# Patient Record
Sex: Male | Born: 1990
Health system: Southern US, Community
[De-identification: ages and names within clinical notes are randomized; demographics above are authoritative.]

## PROBLEM LIST (undated history)

## (undated) ENCOUNTER — Emergency Department (HOSPITAL_COMMUNITY): Payer: 59

## (undated) DIAGNOSIS — J0100 Acute maxillary sinusitis, unspecified: Secondary | ICD-10-CM

## (undated) DIAGNOSIS — D571 Sickle-cell disease without crisis: Secondary | ICD-10-CM

## (undated) DIAGNOSIS — L309 Dermatitis, unspecified: Secondary | ICD-10-CM

## (undated) HISTORY — PX: WISDOM TOOTH EXTRACTION: SHX21

## (undated) HISTORY — DX: Sickle-cell disease without crisis: D57.1

## (undated) HISTORY — DX: Acute maxillary sinusitis, unspecified: J01.00

---
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
## 2009-09-16 ENCOUNTER — Emergency Department (HOSPITAL_COMMUNITY): Admission: EM | Admit: 2009-09-16 | Discharge: 2009-09-16 | Payer: Self-pay | Admitting: Emergency Medicine

## 2010-08-22 ENCOUNTER — Emergency Department (HOSPITAL_COMMUNITY)
Admission: EM | Admit: 2010-08-22 | Discharge: 2010-08-22 | Payer: Self-pay | Source: Home / Self Care | Admitting: Emergency Medicine

## 2011-11-02 ENCOUNTER — Emergency Department (HOSPITAL_COMMUNITY): Payer: Self-pay

## 2011-11-02 ENCOUNTER — Emergency Department (HOSPITAL_COMMUNITY)
Admission: EM | Admit: 2011-11-02 | Discharge: 2011-11-02 | Disposition: A | Discharge status: Home / Self Care | Payer: Self-pay | Attending: Emergency Medicine | Admitting: Emergency Medicine

## 2011-11-02 ENCOUNTER — Encounter (HOSPITAL_COMMUNITY): Payer: Self-pay | Admitting: *Deleted

## 2011-11-02 DIAGNOSIS — M25473 Effusion, unspecified ankle: Secondary | ICD-10-CM | POA: Insufficient documentation

## 2011-11-02 DIAGNOSIS — Y9239 Other specified sports and athletic area as the place of occurrence of the external cause: Secondary | ICD-10-CM | POA: Insufficient documentation

## 2011-11-02 DIAGNOSIS — M25476 Effusion, unspecified foot: Secondary | ICD-10-CM | POA: Insufficient documentation

## 2011-11-02 DIAGNOSIS — M25579 Pain in unspecified ankle and joints of unspecified foot: Secondary | ICD-10-CM | POA: Insufficient documentation

## 2011-11-02 DIAGNOSIS — S93409A Sprain of unspecified ligament of unspecified ankle, initial encounter: Secondary | ICD-10-CM | POA: Insufficient documentation

## 2011-11-02 DIAGNOSIS — X500XXA Overexertion from strenuous movement or load, initial encounter: Secondary | ICD-10-CM | POA: Insufficient documentation

## 2011-11-02 DIAGNOSIS — Y9367 Activity, basketball: Secondary | ICD-10-CM | POA: Insufficient documentation

## 2011-11-02 MED ORDER — IBUPROFEN 200 MG PO TABS
600.0000 mg | ORAL_TABLET | Freq: Once | ORAL | Status: AC
  Administered 2011-11-02: 600 mg via ORAL
  Filled 2011-11-02: qty 3

## 2011-11-02 MED ORDER — IBUPROFEN 600 MG PO TABS: 600.0000 mg | ORAL_TABLET | Freq: Four times a day (QID) | ORAL | Status: AC | PRN

## 2011-11-02 NOTE — ED Provider Notes (Signed)
History     CSN: [id number]  Arrival date & time 11/02/11  7829   First MD Initiated Contact with Patient 11/02/11 [phone]      Chief Complaint  Patient presents with  . Ankle Pain    (Consider location/radiation/quality/duration/timing/severity/associated sxs/prior treatment) Patient is a 21 y.o. male presenting with ankle pain. The history is provided by the patient.  Ankle Pain  Pertinent negatives include no numbness.  pt states twisted left ankle playing bball last pm. C/o pain esp laterally. Constant, dull, non radiating. Worse w walking and palpation. Skin intact. Painful to walk. No numbness/weakness. No proximal tib/fib or knee pain. Denies other injury.   Past Medical History  Diagnosis Date  . Sickle cell trait     History reviewed. No pertinent past surgical history.  No family history on file.  History  Substance Use Topics  . Smoking status: Former Games developer  . Smokeless tobacco: Not on file  . Alcohol Use: Yes      Review of Systems  Constitutional: Negative for fever.  Skin: Negative for wound.  Neurological: Negative for numbness.    Allergies  Review of patient's allergies indicates no known allergies.  Home Medications  No current outpatient prescriptions on file.  BP 118/80  Pulse 97  Temp(Src) 97.7 F (36.5 C) (Oral)  Resp 16  Ht 5' 7.5" (1.715 m)  Wt 155 lb (70.308 kg)  BMI 23.92 kg/m2  SpO2 98%  Physical Exam  Nursing note and vitals reviewed. Constitutional: He appears well-developed and well-nourished. No distress.  HENT:  Head: Atraumatic.  Neck: No tracheal deviation present.  Cardiovascular: Normal rate.   Pulmonary/Chest: Effort normal. No accessory muscle usage. No respiratory distress.  Musculoskeletal: Normal range of motion.       Mild sts and tenderness laterally. Ankle grossly stable. Distal pulses palp. Skin intact. Good rom at knee and ankle. No prox tib fib or knee tenderness. No knee effusion.   Neurological: He is  alert.       Motor intact bil. Foot nvi.   Skin: Skin is warm and dry.  Psychiatric: He has a normal mood and affect.    ED Course  Procedures (including critical care time)  Labs Reviewed - No data to display Dg Ankle Complete Left  11/02/2011  *RADIOLOGY REPORT*  Clinical Data: Ankle pain post injury  LEFT ANKLE COMPLETE - 3+ VIEW  Comparison: None.  Findings: Three views of the left ankle submitted.  No acute fracture or subluxation.  Mild soft tissue swelling adjacent to lateral malleolus.  IMPRESSION: No acute fracture or subluxation.  Mild soft tissue swelling adjacent to lateral malleolus.  Original Report Authenticated By: Natasha Mead, M.D.        MDM  Nilda Calamity. Confirmed nkda. Motrin po. Pt already has crutches with him. aso brace applied.         Suzi Roots, MD 11/02/11 [phone]

## 2011-11-02 NOTE — ED Notes (Signed)
Pt discharged home, instructed to use RICE to reduce swelling and discomfort. Had no further questions. Will use crutches that he came to facility with.

## 2011-11-02 NOTE — ED Notes (Signed)
Ortho at bedside placing splint

## 2011-11-02 NOTE — Discharge Instructions (Signed)
Elevate ankle. Icepack/cold to sore area. Wear brace for comfort/support as need for the next few days. Use crutches as need. Follow up with primary care doctor in next couple weeks if symptoms fail to improve/resolve. Return to ER if worse, new symptoms, other concern.     Ankle Sprain An ankle sprain is an injury to the strong, fibrous tissues (ligaments) that hold the bones of your ankle joint together.  CAUSES Ankle sprain usually is caused by a fall or by twisting your ankle. People who participate in sports are more prone to these types of injuries.  SYMPTOMS  Symptoms of ankle sprain include:  Pain in your ankle. The pain may be present at rest or only when you are trying to stand or walk.   Swelling.   Bruising. Bruising may develop immediately or within 1 to 2 days after your injury.   Difficulty standing or walking.  DIAGNOSIS  Your caregiver will ask you details about your injury and perform a physical exam of your ankle to determine if you have an ankle sprain. During the physical exam, your caregiver will press and squeeze specific areas of your foot and ankle. Your caregiver will try to move your ankle in certain ways. An X-ray exam may be done to be sure a bone was not broken or a ligament did not separate from one of the bones in your ankle (avulsion).  TREATMENT  Certain types of braces can help stabilize your ankle. Your caregiver can make a recommendation for this. Your caregiver may recommend the use of medication for pain. If your sprain is severe, your caregiver may refer you to a surgeon who helps to restore function to parts of your skeletal system (orthopedist) or a physical therapist. HOME CARE INSTRUCTIONS  Apply ice to your injury for 1 to 2 days or as directed by your caregiver. Applying ice helps to reduce inflammation and pain.  Put ice in a plastic bag.   Place a towel between your skin and the bag.   Leave the ice on for 15 to 20 minutes at a time, every  2 hours while you are awake.   Take over-the-counter or prescription medicines for pain, discomfort, or fever only as directed by your caregiver.   Keep your injured leg elevated, when possible, to lessen swelling.   If your caregiver recommends crutches, use them as instructed. Gradually, put weight on the affected ankle. Continue to use crutches or a cane until you can walk without feeling pain in your ankle.   If you have a plaster splint, wear the splint as directed by your caregiver. Do not rest it on anything harder than a pillow the first 24 hours. Do not put weight on it. Do not get it wet. You may take it off to take a shower or bath.   You may have been given an elastic bandage to wear around your ankle to provide support. If the elastic bandage is too tight (you have numbness or tingling in your foot or your foot becomes cold and blue), adjust the bandage to make it comfortable.   If you have an air splint, you may blow more air into it or let air out to make it more comfortable. You may take your splint off at night and before taking a shower or bath.   Wiggle your toes in the splint several times per day if you are able.  SEEK MEDICAL CARE IF:   You have an increase in bruising, swelling,  or pain.   Your toes feel cold.   Pain relief is not achieved with medication.  SEEK IMMEDIATE MEDICAL CARE IF: Your toes are numb or blue or you have severe pain. MAKE SURE YOU:   Understand these instructions.   Will watch your condition.   Will get help right away if you are not doing well or get worse.  Document Released: 07/12/2005 Document Revised: 07/01/2011 Document Reviewed: 02/14/2008 [facility] Patient Information 2012 [place], [place].    Cryotherapy Cryotherapy means treatment with cold. Ice or gel packs can be used to reduce both pain and swelling. Ice is the most helpful within the first 24 to 48 hours after an injury or flareup from overusing a muscle or joint. Sprains,  strains, spasms, burning pain, shooting pain, and aches can all be eased with ice. Ice can also be used when recovering from surgery. Ice is effective, has very few side effects, and is safe for most people to use. PRECAUTIONS  Ice is not a safe treatment option for people with:  Raynaud's phenomenon. This is a condition affecting small blood vessels in the extremities. Exposure to cold may cause your problems to return.   Cold hypersensitivity. There are many forms of cold hypersensitivity, including:   Cold urticaria. Red, itchy hives appear on the skin when the tissues begin to warm after being iced.   Cold erythema. This is a red, itchy rash caused by exposure to cold.   Cold hemoglobinuria. Red blood cells break down when the tissues begin to warm after being iced. The hemoglobin that carry oxygen are passed into the urine because they cannot combine with blood proteins fast enough.   Numbness or altered sensitivity in the area being iced.  If you have any of the following conditions, do not use ice until you have discussed cryotherapy with your caregiver:  Heart conditions, such as arrhythmia, angina, or chronic heart disease.   High blood pressure.   Healing wounds or open skin in the area being iced.   Current infections.   Rheumatoid arthritis.   Poor circulation.   Diabetes.  Ice slows the blood flow in the region it is applied. This is beneficial when trying to stop inflamed tissues from spreading irritating chemicals to surrounding tissues. However, if you expose your skin to cold temperatures for too long or without the proper protection, you can damage your skin or nerves. Watch for signs of skin damage due to cold. HOME CARE INSTRUCTIONS Follow these tips to use ice and cold packs safely.  Place a dry or damp towel between the ice and skin. A damp towel will cool the skin more quickly, so you may need to shorten the time that the ice is used.   For a more rapid  response, add gentle compression to the ice.   Ice for no more than 10 to 20 minutes at a time. The bonier the area you are icing, the less time it will take to get the benefits of ice.   Check your skin after 5 minutes to make sure there are no signs of a poor response to cold or skin damage.   Rest 20 minutes or more in between uses.   Once your skin is numb, you can end your treatment. You can test numbness by very lightly touching your skin. The touch should be so light that you do not see the skin dimple from the pressure of your fingertip. When using ice, most people will feel these normal  sensations in this order: cold, burning, aching, and numbness.   Do not use ice on someone who cannot communicate their responses to pain, such as small children or people with dementia.  HOW TO MAKE AN ICE PACK Ice packs are the most common way to use ice therapy. Other methods include ice massage, ice baths, and cryo-sprays. Muscle creams that cause a cold, tingly feeling do not offer the same benefits that ice offers and should not be used as a substitute unless recommended by your caregiver. To make an ice pack, do one of the following:  Place crushed ice or a bag of frozen vegetables in a sealable plastic bag. Squeeze out the excess air. Place this bag inside another plastic bag. Slide the bag into a pillowcase or place a damp towel between your skin and the bag.   Mix 3 parts water with 1 part rubbing alcohol. Freeze the mixture in a sealable plastic bag. When you remove the mixture from the freezer, it will be slushy. Squeeze out the excess air. Place this bag inside another plastic bag. Slide the bag into a pillowcase or place a damp towel between your skin and the bag.  SEEK MEDICAL CARE IF:  You develop white spots on your skin. This may give the skin a blotchy (mottled) appearance.   Your skin turns blue or pale.   Your skin becomes waxy or hard.   Your swelling gets worse.  MAKE SURE  YOU:   Understand these instructions.   Will watch your condition.   Will get help right away if you are not doing well or get worse.  Document Released: 03/08/2011 Document Revised: 07/01/2011 Document Reviewed: 03/08/2011 [facility] Patient Information 2012 [place], [place].    Crutch Use You have been prescribed crutches to take weight off one of your lower legs or feet (extremities). When using crutches, make sure you are not putting pressure on the armpit (axilla). This could cause damage to the nerves that extend from your axilla to the hand and arm. When fitted properly the crutches should be 2 to 3 finger widths below the axilla. Your weight should be supported by your hand, and not by resting upon the crutch with the axilla. When walking, first step with the crutches, then swing the healthy leg through and slightly ahead. When going up stairs, first step up with the healthy leg and then follow with the crutches and injured leg up to the same step, and so forth. If there is a handrail, hold both crutches in one hand, place your other hand on the handrail, and while placing your weight on your arms, lift your good leg to the step, then bring the crutches and the injured leg up to that step. Repeat for each step. When going down stairs, first step with the injured leg and crutches, following down with the healthy leg to the same step. Be very careful, as going down stairs with crutches is very challenging. If you feel wobbly or nervous, sit down and inch yourself down the stairs on your butt. To get up from a chair, hold injured leg forward, grab armrest with one hand and the top of the crutches with the other hand. Using these supports, pull yourself up to a standing position. Reverse this procedure for sitting. See your caregiver for follow up as suggested. If you are discharged in an ace wrap and develop numbness, tingling, swelling, or increased pain, loosen the ace wrap and re-wrap looser.  If these problems  persist, see your caregiver as needed. If you have been instructed to use partial weight bearing, bear (apply) the amount of weight as suggested by your caregiver. Do not bear weight in an amount that causes pain on the area of injury. Document Released: 07/09/2000 Document Revised: 07/01/2011 Document Reviewed: 09/16/2008 [facility] Patient Information 2012 [place], [place].

## 2011-11-02 NOTE — ED Notes (Signed)
Patient was playing basketball on yesterday and twisted his left ankle.  He states he has swelling and he cannot walk on the foot due to pain.  He states he can barely move his toes

## 2011-11-02 NOTE — ED Notes (Signed)
Pt presents to department for evaluation of L ankle pain and swelling. States he "turned" ankle last night while playing basketball. Now states swelling and pain. Unable to wiggle digits. Capillary refill less than 2 seconds. Pedal pulses present. Pt states numbness to toes. Crutches to triage. 8/10 pain at the time. He is alert and oriented x4. No signs of acute distress.

## 2012-03-22 ENCOUNTER — Encounter (HOSPITAL_COMMUNITY): Payer: Self-pay | Admitting: *Deleted

## 2012-03-22 ENCOUNTER — Emergency Department (HOSPITAL_COMMUNITY)
Admission: EM | Admit: 2012-03-22 | Discharge: 2012-03-22 | Disposition: A | Discharge status: Home / Self Care | Payer: Self-pay | Attending: Emergency Medicine | Admitting: Emergency Medicine

## 2012-03-22 DIAGNOSIS — D573 Sickle-cell trait: Secondary | ICD-10-CM | POA: Insufficient documentation

## 2012-03-22 DIAGNOSIS — Z87891 Personal history of nicotine dependence: Secondary | ICD-10-CM | POA: Insufficient documentation

## 2012-03-22 DIAGNOSIS — L259 Unspecified contact dermatitis, unspecified cause: Secondary | ICD-10-CM | POA: Insufficient documentation

## 2012-03-22 DIAGNOSIS — L309 Dermatitis, unspecified: Secondary | ICD-10-CM

## 2012-03-22 HISTORY — DX: Dermatitis, unspecified: L30.9

## 2012-03-22 MED ORDER — DEXAMETHASONE SODIUM PHOSPHATE 10 MG/ML IJ SOLN
10.0000 mg | Freq: Once | INTRAMUSCULAR | Status: AC
  Administered 2012-03-22: 10 mg via INTRAMUSCULAR
  Filled 2012-03-22: qty 1

## 2012-03-22 NOTE — ED Notes (Signed)
Pt reports eczema on arms, legs and chest.  Pain rated 9/10.  Pt had cream, does not recall name.  Pt was also taking pill (small and green) for itching and it was helping.  Pt has not had pills or cream in 1.5 weeks.  Pt alert oriented X4

## 2012-03-22 NOTE — ED Provider Notes (Signed)
History   This chart was scribed for Austin Shi, MD by Sofie Rower. The patient was seen in room TR04C/TR04C and the patient's care was started at 7:14 PM     CSN: [id number]  Arrival date & time 03/22/12  1718   First MD Initiated Contact with Patient 03/22/12 1856      Chief Complaint  Patient presents with  . Rash    (Consider location/radiation/quality/duration/timing/severity/associated sxs/prior treatment) Patient is a 21 y.o. male presenting with rash. The history is provided by the patient. No language interpreter was used.  Rash  This is a recurrent problem. The current episode started 6 to 12 hours ago. The problem has been gradually worsening. The problem is associated with an unknown factor. There has been no fever. The rash is present on the face, right arm, abdomen, left lower leg and right lower leg. The pain is moderate. The pain has been constant since onset. Associated symptoms include itching and pain. He has tried anti-itch cream for the symptoms. The treatment provided no relief.    Austin [name] is a 21 y.o. male , with a hx of eczema, who presents to the Emergency Department complaining of sudden, progressively worsening, rash located at the right side of the face, right arm, bilaterally at both legs and the abdomen onset today with associated symptoms of itchiness located at the eyes bilaterally. The pt reports he has experienced an eczema flair up today causing him severe discomfort. Modifying factors include application of an over the counter anti-itch cream which did not provide relief. The pt has a hx of eczema and sickle cell trait.   The pt denies having ever visited with a Dermatologist.   The pt does not smoke, however, he does drink alcohol.    Past Medical History  Diagnosis Date  . Sickle cell trait   . Eczema     History reviewed. No pertinent past surgical history.  History reviewed. No pertinent family history.  History  Substance  Use Topics  . Smoking status: Former Games developer  . Smokeless tobacco: Not on file  . Alcohol Use: Yes      Review of Systems  Skin: Positive for itching and rash.  All other systems reviewed and are negative.    Allergies  Review of patient's allergies indicates no known allergies.  Home Medications   Current Outpatient Rx  Name Route Sig Dispense Refill  . HYDROXYZINE HCL 25 MG PO TABS Oral Take 25 mg by mouth every 8 (eight) hours as needed. For itching    . TRIAMCINOLONE ACETONIDE 0.1 % EX CREA Topical Apply 1 application topically 2 (two) times daily.      BP 121/78  Pulse 89  Temp 98.9 F (37.2 C) (Oral)  Resp 16  SpO2 98%  Physical Exam  Nursing note and vitals reviewed. Constitutional: He is oriented to person, place, and time. He appears well-developed. No distress.  HENT:  Head: Normocephalic and atraumatic.  Eyes: Pupils are equal, round, and reactive to light.  Neck: Normal range of motion.  Cardiovascular: Normal rate and intact distal pulses.   Pulmonary/Chest: No respiratory distress.  Abdominal: Normal appearance. He exhibits no distension.  Musculoskeletal: Normal range of motion.  Neurological: He is alert and oriented to person, place, and time. No cranial nerve deficit.  Skin: Skin is warm and dry. Rash noted.  Psychiatric: He has a normal mood and affect. His behavior is normal.    ED Course  Procedures (including critical care  time)  DIAGNOSTIC STUDIES: Oxygen Saturation is 98% on room air, normal by my interpretation.    COORDINATION OF CARE:    7:16 PM- Application of steroids and hydrocortisone cream discussed. Pt agrees to treatment.   Labs Reviewed - No data to display No results found.   1. Eczema       MDM         I personally performed the services described in this documentation, which was scribed in my presence. The recorded information has been reviewed and considered.    Austin Shi, MD 03/24/12 1145

## 2012-03-22 NOTE — ED Notes (Signed)
Pt reports eczema flair up to right side of face, right arm, abdomen, and legs. Pt reports severe discomfort to these areas. Denies pain due to sickle cell.

## 2012-08-20 ENCOUNTER — Emergency Department (HOSPITAL_COMMUNITY)
Admission: EM | Admit: 2012-08-20 | Discharge: 2012-08-21 | Disposition: A | Discharge status: Home / Self Care | Payer: Self-pay | Attending: Emergency Medicine | Admitting: Emergency Medicine

## 2012-08-20 ENCOUNTER — Encounter (HOSPITAL_COMMUNITY): Payer: Self-pay | Admitting: *Deleted

## 2012-08-20 DIAGNOSIS — L309 Dermatitis, unspecified: Secondary | ICD-10-CM

## 2012-08-20 DIAGNOSIS — Z862 Personal history of diseases of the blood and blood-forming organs and certain disorders involving the immune mechanism: Secondary | ICD-10-CM | POA: Insufficient documentation

## 2012-08-20 DIAGNOSIS — Z79899 Other long term (current) drug therapy: Secondary | ICD-10-CM | POA: Insufficient documentation

## 2012-08-20 DIAGNOSIS — F17200 Nicotine dependence, unspecified, uncomplicated: Secondary | ICD-10-CM | POA: Insufficient documentation

## 2012-08-20 DIAGNOSIS — L259 Unspecified contact dermatitis, unspecified cause: Secondary | ICD-10-CM | POA: Insufficient documentation

## 2012-08-20 NOTE — ED Notes (Addendum)
H/o eczema. C/o worsening dry skin. Painful and itching. Pinpoints pain and problematic area as: legs, arms, stomach & back. Denies fever. Does not have a PCP or dermatologist. Not sure of last flare up, "they happen so frequently/continuously", "has not been on steroids that he knows of".

## 2012-08-20 NOTE — ED Notes (Signed)
The patient states that he woke up at 2030 and that he noticed he was having an eczema flare up in all 4 extremities.

## 2012-08-21 MED ORDER — TRIAMCINOLONE ACETONIDE 0.1 % EX CREA: TOPICAL_CREAM | Freq: Two times a day (BID) | CUTANEOUS | Status: DC

## 2012-08-21 MED ORDER — HYDROXYZINE HCL 25 MG PO TABS: 25.0000 mg | ORAL_TABLET | Freq: Four times a day (QID) | ORAL | Status: DC

## 2012-08-21 MED ORDER — TRIAMCINOLONE ACETONIDE 0.1 % EX CREA: 1.0000 "application " | TOPICAL_CREAM | Freq: Two times a day (BID) | CUTANEOUS | Status: DC

## 2012-08-21 MED ORDER — DIPHENHYDRAMINE HCL 25 MG PO CAPS
25.0000 mg | ORAL_CAPSULE | Freq: Once | ORAL | Status: AC
  Administered 2012-08-21: 25 mg via ORAL
  Filled 2012-08-21: qty 1

## 2012-08-21 MED ORDER — PREDNISONE 20 MG PO TABS: 20.0000 mg | ORAL_TABLET | Freq: Two times a day (BID) | ORAL | Status: DC

## 2012-08-21 NOTE — ED Notes (Signed)
The patient is AOx4 and comfortable with his discharge instructions. 

## 2012-08-21 NOTE — ED Provider Notes (Signed)
History     CSN: [id number]  Arrival date & time 08/20/12  2246   First MD Initiated Contact with Patient 08/20/12 2303      Chief Complaint  Patient presents with  . Rash    hx of eczema    (Consider location/radiation/quality/duration/timing/severity/associated sxs/prior treatment) HPI History provided by pt.   Pt has h/o eczema.  Presents w/ c/o acute exacerbation since yesterday.  Rash is diffuse and severely pruritic; typical in nature.  No associated fever.  Has run out of refills of atarax and triamcinolone cream, which normally alleviate his symptoms.   Past Medical History  Diagnosis Date  . Sickle cell trait   . Eczema     History reviewed. No pertinent past surgical history.  No family history on file.  History  Substance Use Topics  . Smoking status: Current Every Day Smoker  . Smokeless tobacco: Not on file  . Alcohol Use: Yes      Review of Systems  All other systems reviewed and are negative.    Allergies  Review of patient's allergies indicates no known allergies.  Home Medications   Current Outpatient Rx  Name  Route  Sig  Dispense  Refill  . HYDROXYZINE HCL 25 MG PO TABS   Oral   Take 25 mg by mouth every 8 (eight) hours as needed. For itching         . HYDROXYZINE HCL 25 MG PO TABS   Oral   Take 1 tablet (25 mg total) by mouth every 6 (six) hours.   20 tablet   0   . PREDNISONE 20 MG PO TABS   Oral   Take 1 tablet (20 mg total) by mouth 2 (two) times daily.   10 tablet   0   . TRIAMCINOLONE ACETONIDE 0.1 % EX CREA   Topical   Apply topically 2 (two) times daily.   30 g   0   . TRIAMCINOLONE ACETONIDE 0.1 % EX CREA   Topical   Apply 1 application topically 2 (two) times daily.   30 g   0     BP 125/65  Pulse 63  Temp 97.4 F (36.3 C) (Oral)  Resp 20  SpO2 98%  Physical Exam  Nursing note and vitals reviewed. Constitutional: He is oriented to person, place, and time. He appears well-developed and  well-nourished. No distress.  HENT:  Head: Normocephalic and atraumatic.  Eyes:       Normal appearance  Neck: Normal range of motion.  Pulmonary/Chest: Effort normal.  Musculoskeletal: Normal range of motion.  Neurological: He is alert and oriented to person, place, and time.  Skin:       Rash consistent w/ severe, diffuse eczema.  Large, hyperpigmented, scaly, leathery plaques.  No signs of cellulitis.  Pt scratching.    Psychiatric: He has a normal mood and affect. His behavior is normal.    ED Course  Procedures (including critical care time)  Labs Reviewed - No data to display No results found.   1. Eczema       MDM  21yo M presents w/ eczema exacerbation.  Severe and diffuse.  No signs of cellulitis.  Refilled his triamcinolone and atarax and prescribed 5 day course of prednisone as well.  Recommended cool compresses and avoidance of scratching and referred to dermatology.  Return precautions discussed.         Otilio Miu, PA-C 08/21/12 [phone]

## 2012-08-22 NOTE — ED Provider Notes (Signed)
Medical screening examination/treatment/procedure(s) were performed by non-physician practitioner and as supervising physician I was immediately available for consultation/collaboration.  Jones Skene, M.D.     Jones Skene, MD 08/22/12 4010

## 2012-11-10 ENCOUNTER — Emergency Department (HOSPITAL_COMMUNITY): Payer: No Typology Code available for payment source

## 2012-11-10 ENCOUNTER — Emergency Department (HOSPITAL_COMMUNITY)
Admission: EM | Admit: 2012-11-10 | Discharge: 2012-11-10 | Disposition: A | Discharge status: Home / Self Care | Payer: No Typology Code available for payment source | Attending: Emergency Medicine | Admitting: Emergency Medicine

## 2012-11-10 ENCOUNTER — Encounter (HOSPITAL_COMMUNITY): Payer: Self-pay | Admitting: Emergency Medicine

## 2012-11-10 DIAGNOSIS — F17200 Nicotine dependence, unspecified, uncomplicated: Secondary | ICD-10-CM | POA: Insufficient documentation

## 2012-11-10 DIAGNOSIS — S4980XA Other specified injuries of shoulder and upper arm, unspecified arm, initial encounter: Secondary | ICD-10-CM | POA: Insufficient documentation

## 2012-11-10 DIAGNOSIS — IMO0002 Reserved for concepts with insufficient information to code with codable children: Secondary | ICD-10-CM | POA: Insufficient documentation

## 2012-11-10 DIAGNOSIS — Z872 Personal history of diseases of the skin and subcutaneous tissue: Secondary | ICD-10-CM | POA: Insufficient documentation

## 2012-11-10 DIAGNOSIS — Y9389 Activity, other specified: Secondary | ICD-10-CM | POA: Insufficient documentation

## 2012-11-10 DIAGNOSIS — M25511 Pain in right shoulder: Secondary | ICD-10-CM

## 2012-11-10 DIAGNOSIS — Z862 Personal history of diseases of the blood and blood-forming organs and certain disorders involving the immune mechanism: Secondary | ICD-10-CM | POA: Insufficient documentation

## 2012-11-10 DIAGNOSIS — D573 Sickle-cell trait: Secondary | ICD-10-CM | POA: Insufficient documentation

## 2012-11-10 DIAGNOSIS — S46909A Unspecified injury of unspecified muscle, fascia and tendon at shoulder and upper arm level, unspecified arm, initial encounter: Secondary | ICD-10-CM | POA: Insufficient documentation

## 2012-11-10 DIAGNOSIS — Y92410 Unspecified street and highway as the place of occurrence of the external cause: Secondary | ICD-10-CM | POA: Insufficient documentation

## 2012-11-10 MED ORDER — IBUPROFEN 400 MG PO TABS
600.0000 mg | ORAL_TABLET | Freq: Once | ORAL | Status: AC
  Administered 2012-11-10: 600 mg via ORAL
  Filled 2012-11-10: qty 1

## 2012-11-10 MED ORDER — MELOXICAM 15 MG PO TABS: 15.0000 mg | ORAL_TABLET | Freq: Every day | ORAL | Status: DC

## 2012-11-10 MED ORDER — METHOCARBAMOL 500 MG PO TABS: 500.0000 mg | ORAL_TABLET | Freq: Two times a day (BID) | ORAL | Status: DC | PRN
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
## 2012-11-10 NOTE — Progress Notes (Signed)
Orthopedic Tech Progress Note Patient Details:  Austin [name] [birth year] [id number] Applied sling to RUE. Ortho Devices Type of Ortho Device: Sling immobilizer Ortho Device/Splint Location: RUE Ortho Device/Splint Interventions: Application   [name] 11/10/2012, 4:20 PM
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
## 2012-11-19 ENCOUNTER — Encounter (HOSPITAL_COMMUNITY): Payer: Self-pay | Admitting: Emergency Medicine

## 2012-11-19 ENCOUNTER — Emergency Department (HOSPITAL_COMMUNITY)
Admission: EM | Admit: 2012-11-19 | Discharge: 2012-11-19 | Disposition: A | Discharge status: Home / Self Care | Payer: No Typology Code available for payment source | Attending: Emergency Medicine | Admitting: Emergency Medicine

## 2012-11-19 DIAGNOSIS — Z872 Personal history of diseases of the skin and subcutaneous tissue: Secondary | ICD-10-CM | POA: Insufficient documentation

## 2012-11-19 DIAGNOSIS — S161XXD Strain of muscle, fascia and tendon at neck level, subsequent encounter: Secondary | ICD-10-CM

## 2012-11-19 DIAGNOSIS — S139XXA Sprain of joints and ligaments of unspecified parts of neck, initial encounter: Secondary | ICD-10-CM | POA: Insufficient documentation

## 2012-11-19 DIAGNOSIS — Y929 Unspecified place or not applicable: Secondary | ICD-10-CM | POA: Insufficient documentation

## 2012-11-19 DIAGNOSIS — X58XXXA Exposure to other specified factors, initial encounter: Secondary | ICD-10-CM | POA: Insufficient documentation

## 2012-11-19 DIAGNOSIS — Y939 Activity, unspecified: Secondary | ICD-10-CM | POA: Insufficient documentation

## 2012-11-19 DIAGNOSIS — Z862 Personal history of diseases of the blood and blood-forming organs and certain disorders involving the immune mechanism: Secondary | ICD-10-CM | POA: Insufficient documentation

## 2012-11-19 MED ORDER — PREDNISONE 50 MG PO TABS: 50.0000 mg | ORAL_TABLET | Freq: Every day | ORAL | Status: DC

## 2012-11-19 NOTE — ED Provider Notes (Signed)
Medical screening examination/treatment/procedure(s) were performed by non-physician practitioner and as supervising physician I was immediately available for consultation/collaboration.  Janylah Belgrave R. Jack Mineau, MD 11/19/12 1537 
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
## 2012-11-22 NOTE — ED Provider Notes (Addendum)
Medical screening examination/treatment/procedure(s) were performed by non-physician practitioner and as supervising physician I was immediately available for consultation/collaboration.  .Face to face Exam:  General:  A&Ox3 HEENT:  Atraumatic Resp:  Normal effort Abd:  Nondistended Neuro:No focal deficits   Nelia Shi, MD 11/22/12 1607  Nelia Shi, MD 12/01/12 [phone]

## 2013-05-02 ENCOUNTER — Encounter (HOSPITAL_COMMUNITY): Payer: Self-pay | Admitting: Emergency Medicine

## 2013-05-02 ENCOUNTER — Emergency Department (HOSPITAL_COMMUNITY)
Admission: EM | Admit: 2013-05-02 | Discharge: 2013-05-02 | Disposition: A | Discharge status: Home / Self Care | Payer: Self-pay | Attending: Emergency Medicine | Admitting: Emergency Medicine

## 2013-05-02 DIAGNOSIS — IMO0001 Reserved for inherently not codable concepts without codable children: Secondary | ICD-10-CM | POA: Insufficient documentation

## 2013-05-02 DIAGNOSIS — M25511 Pain in right shoulder: Secondary | ICD-10-CM

## 2013-05-02 DIAGNOSIS — Z87828 Personal history of other (healed) physical injury and trauma: Secondary | ICD-10-CM | POA: Insufficient documentation

## 2013-05-02 DIAGNOSIS — G8929 Other chronic pain: Secondary | ICD-10-CM

## 2013-05-02 DIAGNOSIS — Z862 Personal history of diseases of the blood and blood-forming organs and certain disorders involving the immune mechanism: Secondary | ICD-10-CM | POA: Insufficient documentation

## 2013-05-02 DIAGNOSIS — F17200 Nicotine dependence, unspecified, uncomplicated: Secondary | ICD-10-CM | POA: Insufficient documentation

## 2013-05-02 DIAGNOSIS — Z872 Personal history of diseases of the skin and subcutaneous tissue: Secondary | ICD-10-CM | POA: Insufficient documentation

## 2013-05-02 DIAGNOSIS — M25519 Pain in unspecified shoulder: Secondary | ICD-10-CM | POA: Insufficient documentation

## 2013-05-02 MED ORDER — IBUPROFEN 800 MG PO TABS: 800.0000 mg | ORAL_TABLET | Freq: Three times a day (TID) | ORAL | Status: DC

## 2013-05-02 MED ORDER — HYDROCODONE-ACETAMINOPHEN 5-325 MG PO TABS: ORAL_TABLET | ORAL | Status: DC
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
## 2013-05-02 NOTE — ED Provider Notes (Signed)
Medical screening examination/treatment/procedure(s) were performed by non-physician practitioner and as supervising physician I was immediately available for consultation/collaboration.  Shon Baton, MD 05/02/13 2024

## 2013-08-09 ENCOUNTER — Emergency Department (HOSPITAL_COMMUNITY)
Admission: EM | Admit: 2013-08-09 | Discharge: 2013-08-09 | Disposition: A | Discharge status: Home / Self Care | Payer: BC Managed Care – PPO | Attending: Emergency Medicine | Admitting: Emergency Medicine

## 2013-08-09 ENCOUNTER — Encounter (HOSPITAL_COMMUNITY): Payer: Self-pay | Admitting: Emergency Medicine

## 2013-08-09 ENCOUNTER — Emergency Department (HOSPITAL_COMMUNITY): Payer: BC Managed Care – PPO

## 2013-08-09 DIAGNOSIS — R072 Precordial pain: Secondary | ICD-10-CM | POA: Insufficient documentation

## 2013-08-09 DIAGNOSIS — F17200 Nicotine dependence, unspecified, uncomplicated: Secondary | ICD-10-CM | POA: Insufficient documentation

## 2013-08-09 DIAGNOSIS — R079 Chest pain, unspecified: Secondary | ICD-10-CM

## 2013-08-09 DIAGNOSIS — Z872 Personal history of diseases of the skin and subcutaneous tissue: Secondary | ICD-10-CM | POA: Insufficient documentation

## 2013-08-09 DIAGNOSIS — Z862 Personal history of diseases of the blood and blood-forming organs and certain disorders involving the immune mechanism: Secondary | ICD-10-CM | POA: Insufficient documentation

## 2013-08-09 LAB — CBC
HEMATOCRIT: 36.3 % — AB (ref 39.0–52.0)
Hemoglobin: 13 g/dL (ref 13.0–17.0)
MCH: 28.4 pg (ref 26.0–34.0)
MCHC: 35.8 g/dL (ref 30.0–36.0)
MCV: 79.3 fL (ref 78.0–100.0)
PLATELETS: 150 10*3/uL (ref 150–400)
RBC: 4.58 MIL/uL (ref 4.22–5.81)
RDW: 14.8 % (ref 11.5–15.5)
WBC: 7.5 10*3/uL (ref 4.0–10.5)

## 2013-08-09 LAB — BASIC METABOLIC PANEL
BUN: 10 mg/dL (ref 6–23)
CHLORIDE: 99 meq/L (ref 96–112)
CO2: 27 meq/L (ref 19–32)
CREATININE: 0.94 mg/dL (ref 0.50–1.35)
Calcium: 9.8 mg/dL (ref 8.4–10.5)
GFR calc Af Amer: >90 mL/min (ref >90)
GFR calc non Af Amer: >90 mL/min (ref >90)
Glucose, Bld: 96 mg/dL (ref 70–99)
Potassium: 4.2 mEq/L (ref 3.7–5.3)
Sodium: 140 mEq/L (ref 137–147)

## 2013-08-09 LAB — POCT I-STAT TROPONIN I
TROPONIN I, POC: 0 ng/mL (ref 0.00–0.08)
Troponin i, poc: 0 ng/mL (ref 0.00–0.08)

## 2013-08-09 NOTE — Discharge Instructions (Signed)
Chest Pain (Nonspecific) °It is often hard to give a specific diagnosis for the cause of chest pain. There is always a chance that your pain could be related to something serious, such as a heart attack or a blood clot in the lungs. You need to follow up with your caregiver for further evaluation. °CAUSES  °· Heartburn. °· Pneumonia or bronchitis. °· Anxiety or stress. °· Inflammation around your heart (pericarditis) or lung (pleuritis or pleurisy). °· A blood clot in the lung. °· A collapsed lung (pneumothorax). It can develop suddenly on its own (spontaneous pneumothorax) or from injury (trauma) to the chest. °· Shingles infection (herpes zoster virus). °The chest wall is composed of bones, muscles, and cartilage. Any of these can be the source of the pain. °· The bones can be bruised by injury. °· The muscles or cartilage can be strained by coughing or overwork. °· The cartilage can be affected by inflammation and become sore (costochondritis). °DIAGNOSIS  °Lab tests or other studies, such as X-rays, electrocardiography, stress testing, or cardiac imaging, may be needed to find the cause of your pain.  °TREATMENT  °· Treatment depends on what may be causing your chest pain. Treatment may include: °· Acid blockers for heartburn. °· Anti-inflammatory medicine. °· Pain medicine for inflammatory conditions. °· Antibiotics if an infection is present. °· You may be advised to change lifestyle habits. This includes stopping smoking and avoiding alcohol, caffeine, and chocolate. °· You may be advised to keep your head raised (elevated) when sleeping. This reduces the chance of acid going backward from your stomach into your esophagus. °· Most of the time, nonspecific chest pain will improve within 2 to 3 days with rest and mild pain medicine. °HOME CARE INSTRUCTIONS  °· If antibiotics were prescribed, take your antibiotics as directed. Finish them even if you start to feel better. °· For the next few days, avoid physical  activities that bring on chest pain. Continue physical activities as directed. °· Do not smoke. °· Avoid drinking alcohol. °· Only take over-the-counter or prescription medicine for pain, discomfort, or fever as directed by your caregiver. °· Follow your caregiver's suggestions for further testing if your chest pain does not go away. °· Keep any follow-up appointments you made. If you do not go to an appointment, you could develop lasting (chronic) problems with pain. If there is any problem keeping an appointment, you must call to reschedule. °SEEK MEDICAL CARE IF:  °· You think you are having problems from the medicine you are taking. Read your medicine instructions carefully. °· Your chest pain does not go away, even after treatment. °· You develop a rash with blisters on your chest. °SEEK IMMEDIATE MEDICAL CARE IF:  °· You have increased chest pain or pain that spreads to your arm, neck, jaw, back, or abdomen. °· You develop shortness of breath, an increasing cough, or you are coughing up blood. °· You have severe back or abdominal pain, feel nauseous, or vomit. °· You develop severe weakness, fainting, or chills. °· You have a fever. °THIS IS AN EMERGENCY. Do not wait to see if the pain will go away. Get medical help at once. Call your local emergency services (911 in [place] Do not drive yourself to the hospital. °MAKE SURE YOU:  °· Understand these instructions. °· Will watch your condition. °· Will get help right away if you are not doing well or get worse. °Document Released: 04/21/2005 Document Revised: 10/04/2011 Document Reviewed: 02/15/2008 °Exit[employer]® Patient Information ©2014 [employer]   [employer]. ° °

## 2013-08-09 NOTE — ED Provider Notes (Signed)
CSN: [id number]     Arrival date & time 08/09/13  1441 History   First MD Initiated Contact with Patient 08/09/13 2052     Chief Complaint  Patient presents with  . Chest Pain   (Consider location/radiation/quality/duration/timing/severity/associated sxs/prior Treatment) Patient is a 23 y.o. male presenting with chest pain. The history is provided by the patient.  Chest Pain Pain location:  Substernal area Pain quality: pressure   Pain radiates to:  Does not radiate Pain radiates to the back: no   Pain severity:  Moderate Onset quality:  Sudden Timing:  Intermittent Progression:  Resolved Chronicity:  New Context: at rest   Context: not breathing   Relieved by:  Nothing Worsened by:  Nothing tried Associated symptoms: no abdominal pain, no cough, no fever, no shortness of breath and not vomiting     Past Medical History  Diagnosis Date  . Sickle cell trait   . Eczema    History reviewed. No pertinent past surgical history. History reviewed. No pertinent family history. History  Substance Use Topics  . Smoking status: Current Every Day Smoker  . Smokeless tobacco: Not on file  . Alcohol Use: Yes    Review of Systems  Constitutional: Negative for fever.  Respiratory: Negative for cough and shortness of breath.   Cardiovascular: Positive for chest pain.  Gastrointestinal: Negative for vomiting and abdominal pain.  All other systems reviewed and are negative.    Allergies  Review of patient's allergies indicates no known allergies.  Home Medications  No current outpatient prescriptions on file. BP 115/70  Pulse 78  Temp(Src) 97.8 F (36.6 C) (Oral)  Resp 15  Ht 5\' 8"  (1.727 m)  Wt 165 lb (74.844 kg)  BMI 25.09 kg/m2  SpO2 99% Physical Exam  Constitutional: He is oriented to person, place, and time. He appears well-developed and well-nourished. No distress.  HENT:  Head: Normocephalic and atraumatic.  Mouth/Throat: No oropharyngeal exudate.  Eyes: EOM  are normal. Pupils are equal, round, and reactive to light.  Neck: Normal range of motion. Neck supple.  Cardiovascular: Normal rate and regular rhythm.  Exam reveals no friction rub.   No murmur heard. Pulmonary/Chest: Effort normal and breath sounds normal. No respiratory distress. He has no wheezes. He has no rales.  Abdominal: He exhibits no distension. There is no tenderness. There is no rebound.  Musculoskeletal: Normal range of motion. He exhibits no edema.  Neurological: He is alert and oriented to person, place, and time.  Skin: He is not diaphoretic.    ED Course  Procedures (including critical care time) Labs Review Labs Reviewed  CBC - Abnormal; Notable for the following:    HCT 36.3 (*)    All other components within normal limits  BASIC METABOLIC PANEL  POCT I-STAT TROPONIN I   Imaging Review Dg Chest 2 View  08/09/2013   CLINICAL DATA:  Chest pain  EXAM: CHEST  2 VIEW  COMPARISON:  None.  FINDINGS: Lungs are clear. Heart size and pulmonary vascularity are normal. No pneumothorax. No adenopathy. No bone lesions.  IMPRESSION: No abnormality noted.   Electronically Signed   By: Bretta Bang M.D.   On: 08/09/2013 15:05    EKG Interpretation    Date/Time:  Thursday August 09 2013 14:46:55 EST Ventricular Rate:  68 PR Interval:  112 QRS Duration: 88 QT Interval:  338 QTC Calculation: 359 R Axis:   83 Text Interpretation:  Normal sinus rhythm with sinus arrhythmia Normal ECG Confirmed by [facility]  MD, Juergen Hardenbrook ([id number]) on 08/09/2013 8:55:40 PM            MDM   1. Chest pain    19M with hx of sickle cell disease presents with chest pain. 2 episodes, one 5 minutes, second one 15 minutes. 10 minutes in between. Mild SOB with this, pressure sensation.  AFVSS here. Patient with benign exam and benign EKG. Will check second troponin. Unlikely ACS, atypical type pain.  Serial troponins negative, CXR negative.   Dagmar HaitWilliam Aala Ransom, MD 08/10/13 [phone]
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
## 2014-01-23 ENCOUNTER — Ambulatory Visit: Payer: BC Managed Care – PPO

## 2014-01-23 ENCOUNTER — Other Ambulatory Visit (HOSPITAL_COMMUNITY)
Admission: RE | Admit: 2014-01-23 | Discharge: 2014-01-23 | Disposition: A | Discharge status: Home / Self Care | Payer: BC Managed Care – PPO | Source: Ambulatory Visit | Attending: Family Medicine | Admitting: Family Medicine

## 2014-01-23 ENCOUNTER — Emergency Department (HOSPITAL_COMMUNITY)
Admission: EM | Admit: 2014-01-23 | Discharge: 2014-01-23 | Disposition: A | Discharge status: Home / Self Care | Payer: BC Managed Care – PPO | Source: Home / Self Care | Attending: Family Medicine | Admitting: Family Medicine

## 2014-01-23 ENCOUNTER — Encounter (HOSPITAL_COMMUNITY): Payer: Self-pay | Admitting: Emergency Medicine

## 2014-01-23 DIAGNOSIS — Z113 Encounter for screening for infections with a predominantly sexual mode of transmission: Secondary | ICD-10-CM | POA: Insufficient documentation

## 2014-01-23 DIAGNOSIS — N342 Other urethritis: Secondary | ICD-10-CM

## 2014-01-23 MED ORDER — CEFTRIAXONE SODIUM 250 MG IJ SOLR
INTRAMUSCULAR | Status: AC
  Filled 2014-01-23: qty 250

## 2014-01-23 MED ORDER — CEFTRIAXONE SODIUM 250 MG IJ SOLR
250.0000 mg | Freq: Once | INTRAMUSCULAR | Status: AC
  Administered 2014-01-23: 250 mg via INTRAMUSCULAR

## 2014-01-23 MED ORDER — AZITHROMYCIN 250 MG PO TABS
ORAL_TABLET | ORAL | Status: AC
  Filled 2014-01-23: qty 4

## 2014-01-23 MED ORDER — LIDOCAINE HCL (PF) 1 % IJ SOLN
INTRAMUSCULAR | Status: AC
  Filled 2014-01-23: qty 5

## 2014-01-23 MED ORDER — AZITHROMYCIN 250 MG PO TABS
1000.0000 mg | ORAL_TABLET | Freq: Once | ORAL | Status: AC
  Administered 2014-01-23: 1000 mg via ORAL

## 2014-01-23 NOTE — ED Notes (Signed)
Patient states his partner has chlamydia and would like to be tested; denies any symptoms, NAD

## 2014-01-23 NOTE — Discharge Instructions (Signed)
Thank you for coming in today.   Sexually Transmitted Disease A sexually transmitted disease (STD) is a disease or infection that may be passed (transmitted) from person to person, usually during sexual activity. This may happen by way of saliva, semen, blood, vaginal mucus, or urine. Common STDs include:   Gonorrhea.   Chlamydia.   Syphilis.   HIV and AIDS.   Genital herpes.   Hepatitis B and C.   Trichomonas.   Human papillomavirus (HPV).   Pubic lice.   Scabies.  Mites.  Bacterial vaginosis. WHAT ARE CAUSES OF STDs? An STD may be caused by bacteria, a virus, or parasites. STDs are often transmitted during sexual activity if one person is infected. However, they may also be transmitted through nonsexual means. STDs may be transmitted after:   Sexual intercourse with an infected person.   Sharing sex toys with an infected person.   Sharing needles with an infected person or using unclean piercing or tattoo needles.  Having intimate contact with the genitals, mouth, or rectal areas of an infected person.   Exposure to infected fluids during birth. WHAT ARE THE SIGNS AND SYMPTOMS OF STDs? Different STDs have different symptoms. Some people may not have any symptoms. If symptoms are present, they may include:   Painful or bloody urination.   Pain in the pelvis, abdomen, vagina, anus, throat, or eyes.   A skin rash, itching, or irritation.  Growths, ulcerations, blisters, or sores in the genital and anal areas.  Abnormal vaginal discharge with or without bad odor.   Penile discharge in men.   Fever.   Pain or bleeding during sexual intercourse.   Swollen glands in the groin area.   Yellow skin and eyes (jaundice). This is seen with hepatitis.   Swollen testicles.  Infertility.  Sores and blisters in the mouth. HOW ARE STDs DIAGNOSED? To make a diagnosis, your health care provider may:   Take a medical history.   Perform a  physical exam.   Take a sample of any discharge to examine.  Swab the throat, cervix, opening to the penis, rectum, or vagina for testing.  Test a sample of your first morning urine.   Perform blood tests.   Perform a Pap test, if this applies.   Perform a colposcopy.   Perform a laparoscopy.  HOW ARE STDs TREATED? Treatment depends on the STD. Some STDs may be treated but not cured.   Chlamydia, gonorrhea, trichomonas, and syphilis can be cured with antibiotic medicine.   Genital herpes, hepatitis, and HIV can be treated, but not cured, with prescribed medicines. The medicines lessen symptoms.   Genital warts from HPV can be treated with medicine or by freezing, burning (electrocautery), or surgery. Warts may come back.   HPV cannot be cured with medicine or surgery. However, abnormal areas may be removed from the cervix, vagina, or vulva.   If your diagnosis is confirmed, your recent sexual partners need treatment. This is true even if they are symptom-free or have a negative culture or evaluation. They should not have sex until their health care providers say it is okay. HOW CAN I REDUCE MY RISK OF GETTING AN STD? Take these steps to reduce your risk of getting an STD:  Use latex condoms, dental dams, and water-soluble lubricants during sexual activity. Do not use petroleum jelly or oils.  Avoid having multiple sex partners.  Do not have sex with someone who has other sex partners.  Do not have sex with anyone  you do not know or who is at high risk for an STD.  Avoid risky sex practices that can break your skin.  Do not have sex if you have open sores on your mouth or skin.  Avoid drinking too much alcohol or taking illegal drugs. Alcohol and drugs can affect your judgment and put you in a vulnerable position.  Avoid engaging in oral and anal sex acts.  Get vaccinated for HPV and hepatitis. If you have not received these vaccines in the past, talk to your  health care provider about whether one or both might be right for you.   If you are at risk of being infected with HIV, it is recommended that you take a prescription medicine daily to prevent HIV infection. This is called pre-exposure prophylaxis (PrEP). You are considered at risk if:  You are a man who has sex with other men (MSM).  You are a heterosexual man or woman and are sexually active with more than one partner.  You take drugs by injection.  You are sexually active with a partner who has HIV.  Talk with your health care provider about whether you are at high risk of being infected with HIV. If you choose to begin PrEP, you should first be tested for HIV. You should then be tested every 3 months for as long as you are taking PrEP.  WHAT SHOULD I DO IF I THINK I HAVE AN STD?  See your health care provider.   Tell your sexual partner(s). They should be tested and treated for any STDs.  Do not have sex until your health care provider says it is okay. WHEN SHOULD I GET IMMEDIATE MEDICAL CARE? Contact your health care provider right away if:   You have severe abdominal pain.  You are a man and notice swelling or pain in your testicles.  You are a woman and notice swelling or pain in your vagina. Document Released: 10/02/2002 Document Revised: 07/17/2013 Document Reviewed: 01/30/2013 [facility] Patient Information 2015 [place], [place]. This information is not intended to replace advice given to you by your health care provider. Make sure you discuss any questions you have with your health care provider.

## 2014-01-23 NOTE — ED Provider Notes (Signed)
Marlinda MikeKeith D Degroat is a 23 y.o. male who presents to Urgent Care today for STD. Patient Zofran recently tested positive for Chlamydia. He notes occasional penile irritation. He denies any discharge or testicle pain. No fevers or chills nausea vomiting or diarrhea.   Past Medical History  Diagnosis Date  . Sickle cell trait   . Eczema    History  Substance Use Topics  . Smoking status: Current Every Day Smoker  . Smokeless tobacco: Not on file  . Alcohol Use: Yes   ROS as above Medications: No current facility-administered medications for this encounter.   No current outpatient prescriptions on file.    Exam:  BP 132/81  Pulse 72  Temp(Src) 98.2 F (36.8 C) (Oral)  Resp 14  SpO2 99% Gen: Well NAD Genital: No inguinal lymphadenopathy Testicles are descended bilaterally and nontender with no masses Penis is circumcised with no lesions or discharge  No results found for this or any previous visit (from the past 24 hour(s)). No results found.  Assessment and Plan: 23 y.o. male with urethritis possibly. Cytology pending, HIV and RPR pending. Empiric treatment with ceftriaxone and azithromycin  Discussed warning signs or symptoms. Please see discharge instructions. Patient expresses understanding.    Rodolph BongEvan S Asim Gersten, MD 01/23/14 2027

## 2014-01-24 LAB — RPR

## 2014-01-24 LAB — HIV ANTIBODY (ROUTINE TESTING W REFLEX): HIV 1&2 Ab, 4th Generation: NONREACTIVE

## 2014-03-06 ENCOUNTER — Encounter (HOSPITAL_COMMUNITY): Payer: Self-pay | Admitting: Emergency Medicine

## 2014-03-06 ENCOUNTER — Emergency Department (HOSPITAL_COMMUNITY)
Admission: EM | Admit: 2014-03-06 | Discharge: 2014-03-06 | Disposition: A | Discharge status: Home / Self Care | Payer: BC Managed Care – PPO | Attending: Emergency Medicine | Admitting: Emergency Medicine

## 2014-03-06 DIAGNOSIS — M545 Low back pain, unspecified: Secondary | ICD-10-CM

## 2014-03-06 DIAGNOSIS — Z862 Personal history of diseases of the blood and blood-forming organs and certain disorders involving the immune mechanism: Secondary | ICD-10-CM | POA: Diagnosis not present

## 2014-03-06 DIAGNOSIS — F17200 Nicotine dependence, unspecified, uncomplicated: Secondary | ICD-10-CM | POA: Diagnosis not present

## 2014-03-06 DIAGNOSIS — Z872 Personal history of diseases of the skin and subcutaneous tissue: Secondary | ICD-10-CM | POA: Diagnosis not present

## 2014-03-06 DIAGNOSIS — M25511 Pain in right shoulder: Secondary | ICD-10-CM

## 2014-03-06 DIAGNOSIS — M25519 Pain in unspecified shoulder: Secondary | ICD-10-CM | POA: Insufficient documentation

## 2014-03-06 MED ORDER — TRAMADOL HCL 50 MG PO TABS: 50.0000 mg | ORAL_TABLET | Freq: Four times a day (QID) | ORAL | Status: DC | PRN

## 2014-03-06 MED ORDER — IBUPROFEN 800 MG PO TABS: 800.0000 mg | ORAL_TABLET | Freq: Three times a day (TID) | ORAL | Status: DC

## 2014-03-06 MED ORDER — METHOCARBAMOL 500 MG PO TABS: 500.0000 mg | ORAL_TABLET | Freq: Two times a day (BID) | ORAL | Status: DC

## 2014-03-06 NOTE — ED Notes (Signed)
Pt reports low back pain and R shoulder pain.  Denies injury.  States back pain could be from laying in an old mattress
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
## 2014-03-06 NOTE — Discharge Instructions (Signed)
Do not drive or operate heavy machinery for 4-6 hours after taking pain medication and muscle relaxer.

## 2014-03-12 NOTE — ED Provider Notes (Signed)
Medical screening examination/treatment/procedure(s) were performed by non-physician practitioner and as supervising physician I was immediately available for consultation/collaboration.    Marrian Bells, MD 03/12/14 1501 

## 2014-05-31 ENCOUNTER — Encounter (HOSPITAL_COMMUNITY): Payer: Self-pay | Admitting: Emergency Medicine

## 2014-05-31 ENCOUNTER — Emergency Department (HOSPITAL_COMMUNITY)
Admission: EM | Admit: 2014-05-31 | Discharge: 2014-05-31 | Disposition: A | Discharge status: Home / Self Care | Payer: BC Managed Care – PPO | Attending: Emergency Medicine | Admitting: Emergency Medicine

## 2014-05-31 DIAGNOSIS — S50312A Abrasion of left elbow, initial encounter: Secondary | ICD-10-CM | POA: Diagnosis not present

## 2014-05-31 DIAGNOSIS — S199XXA Unspecified injury of neck, initial encounter: Secondary | ICD-10-CM | POA: Diagnosis not present

## 2014-05-31 DIAGNOSIS — Z791 Long term (current) use of non-steroidal anti-inflammatories (NSAID): Secondary | ICD-10-CM | POA: Insufficient documentation

## 2014-05-31 DIAGNOSIS — S79912A Unspecified injury of left hip, initial encounter: Secondary | ICD-10-CM | POA: Insufficient documentation

## 2014-05-31 DIAGNOSIS — Y92410 Unspecified street and highway as the place of occurrence of the external cause: Secondary | ICD-10-CM | POA: Diagnosis not present

## 2014-05-31 DIAGNOSIS — Z872 Personal history of diseases of the skin and subcutaneous tissue: Secondary | ICD-10-CM | POA: Diagnosis not present

## 2014-05-31 DIAGNOSIS — Z862 Personal history of diseases of the blood and blood-forming organs and certain disorders involving the immune mechanism: Secondary | ICD-10-CM | POA: Insufficient documentation

## 2014-05-31 DIAGNOSIS — Z720 Tobacco use: Secondary | ICD-10-CM | POA: Diagnosis not present

## 2014-05-31 DIAGNOSIS — Z79899 Other long term (current) drug therapy: Secondary | ICD-10-CM | POA: Insufficient documentation

## 2014-05-31 DIAGNOSIS — Y9389 Activity, other specified: Secondary | ICD-10-CM | POA: Insufficient documentation

## 2014-05-31 DIAGNOSIS — S4991XA Unspecified injury of right shoulder and upper arm, initial encounter: Secondary | ICD-10-CM | POA: Insufficient documentation

## 2014-05-31 MED ORDER — OXYCODONE-ACETAMINOPHEN 5-325 MG PO TABS: 1.0000 | ORAL_TABLET | ORAL | Status: DC | PRN
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
## 2014-05-31 NOTE — Discharge Instructions (Signed)
Take the prescribed medication as directed. °Follow-up with your primary care physician. °Return to the ED for new or worsening symptoms. ° °

## 2015-03-13 ENCOUNTER — Encounter (HOSPITAL_COMMUNITY): Payer: Self-pay

## 2015-03-13 ENCOUNTER — Emergency Department (HOSPITAL_COMMUNITY)
Admission: EM | Admit: 2015-03-13 | Discharge: 2015-03-13 | Disposition: A | Discharge status: Home / Self Care | Payer: Self-pay | Attending: Emergency Medicine | Admitting: Emergency Medicine

## 2015-03-13 DIAGNOSIS — R6883 Chills (without fever): Secondary | ICD-10-CM | POA: Insufficient documentation

## 2015-03-13 DIAGNOSIS — R1032 Left lower quadrant pain: Secondary | ICD-10-CM | POA: Insufficient documentation

## 2015-03-13 DIAGNOSIS — Z720 Tobacco use: Secondary | ICD-10-CM | POA: Insufficient documentation

## 2015-03-13 DIAGNOSIS — R17 Unspecified jaundice: Secondary | ICD-10-CM | POA: Insufficient documentation

## 2015-03-13 DIAGNOSIS — R079 Chest pain, unspecified: Secondary | ICD-10-CM | POA: Insufficient documentation

## 2015-03-13 DIAGNOSIS — R5383 Other fatigue: Secondary | ICD-10-CM | POA: Insufficient documentation

## 2015-03-13 DIAGNOSIS — R42 Dizziness and giddiness: Secondary | ICD-10-CM | POA: Insufficient documentation

## 2015-03-13 DIAGNOSIS — Z862 Personal history of diseases of the blood and blood-forming organs and certain disorders involving the immune mechanism: Secondary | ICD-10-CM | POA: Insufficient documentation

## 2015-03-13 DIAGNOSIS — R110 Nausea: Secondary | ICD-10-CM

## 2015-03-13 DIAGNOSIS — J029 Acute pharyngitis, unspecified: Secondary | ICD-10-CM | POA: Insufficient documentation

## 2015-03-13 DIAGNOSIS — R630 Anorexia: Secondary | ICD-10-CM | POA: Insufficient documentation

## 2015-03-13 DIAGNOSIS — Z872 Personal history of diseases of the skin and subcutaneous tissue: Secondary | ICD-10-CM | POA: Insufficient documentation

## 2015-03-13 LAB — COMPREHENSIVE METABOLIC PANEL
ALK PHOS: 58 U/L (ref 38–126)
ALT: 25 U/L (ref 17–63)
AST: 17 U/L (ref 15–41)
Albumin: 4.9 g/dL (ref 3.5–5.0)
Anion gap: 9 (ref 5–15)
BILIRUBIN TOTAL: 3.6 mg/dL — AB (ref 0.3–1.2)
BUN: 11 mg/dL (ref 6–20)
CALCIUM: 9.7 mg/dL (ref 8.9–10.3)
CO2: 27 mmol/L (ref 22–32)
Chloride: 98 mmol/L — ABNORMAL LOW (ref 101–111)
Creatinine, Ser: 0.93 mg/dL (ref 0.61–1.24)
GFR calc Af Amer: >60 mL/min (ref >60)
Glucose, Bld: 97 mg/dL (ref 65–99)
POTASSIUM: 4.2 mmol/L (ref 3.5–5.1)
Sodium: 134 mmol/L — ABNORMAL LOW (ref 135–145)
TOTAL PROTEIN: 8.9 g/dL — AB (ref 6.5–8.1)

## 2015-03-13 LAB — LIPASE, BLOOD: Lipase: 14 U/L — ABNORMAL LOW (ref 22–51)

## 2015-03-13 LAB — CBC
HCT: 36.6 % — ABNORMAL LOW (ref 39.0–52.0)
Hemoglobin: 13 g/dL (ref 13.0–17.0)
MCH: 28.3 pg (ref 26.0–34.0)
MCHC: 35.5 g/dL (ref 30.0–36.0)
MCV: 79.6 fL (ref 78.0–100.0)
PLATELETS: 93 10*3/uL — AB (ref 150–400)
RBC: 4.6 MIL/uL (ref 4.22–5.81)
RDW: 15.1 % (ref 11.5–15.5)
WBC: 8 10*3/uL (ref 4.0–10.5)

## 2015-03-13 LAB — MONONUCLEOSIS SCREEN: MONO SCREEN: NEGATIVE

## 2015-03-13 LAB — URINALYSIS, ROUTINE W REFLEX MICROSCOPIC
Glucose, UA: NEGATIVE mg/dL
HGB URINE DIPSTICK: NEGATIVE
KETONES UR: 40 mg/dL — AB
NITRITE: POSITIVE — AB
PROTEIN: 30 mg/dL — AB
SPECIFIC GRAVITY, URINE: 1.037 — AB (ref 1.005–1.030)
UROBILINOGEN UA: 4 mg/dL — AB (ref 0.0–1.0)
pH: 5.5 (ref 5.0–8.0)

## 2015-03-13 LAB — URINE MICROSCOPIC-ADD ON

## 2015-03-13 LAB — RAPID STREP SCREEN (MED CTR MEBANE ONLY): STREPTOCOCCUS, GROUP A SCREEN (DIRECT): NEGATIVE

## 2015-03-13 MED ORDER — NAPROXEN 250 MG PO TABS: 250.0000 mg | ORAL_TABLET | Freq: Two times a day (BID) | ORAL | Status: DC

## 2015-03-13 MED ORDER — SODIUM CHLORIDE 0.9 % IV BOLUS (SEPSIS)
1000.0000 mL | Freq: Once | INTRAVENOUS | Status: AC
  Administered 2015-03-13: 1000 mL via INTRAVENOUS

## 2015-03-13 MED ORDER — ONDANSETRON HCL 4 MG/2ML IJ SOLN
4.0000 mg | Freq: Once | INTRAMUSCULAR | Status: AC
  Administered 2015-03-13: 4 mg via INTRAVENOUS
  Filled 2015-03-13: qty 2

## 2015-03-13 NOTE — ED Notes (Signed)
Pt c/o intermittent abdominal pain, chills, and nausea x "a couple days" and slight R side chest discomfort starting this afternoon.  Pain score 8/10.  Denies current chest pain.  Pt has not taken anything for symptoms.

## 2015-03-13 NOTE — Progress Notes (Addendum)
EDCM spoke to patient at bedside. Patient confirms he does not have a pcp or insurance living in [facility] and is employed.  [facility] provided patient with pamphlet to [facility], informed patient of services there and walk in times.  EDCM also provided patient with list of pcps who accept self pay patients, list of discount pharmacies and websites needymeds.org and GoodRX.com for medication assistance, phone number to inquire about the orange card, phone number to inquire about Mediciad, phone number to inquire about the Affordable Care Act, financial resources in the community such as local churches, [employer], urban ministries, and dental assistance for uninsured patients.  Patient thankful for resources.  No further EDCM needs at this time.

## 2015-03-13 NOTE — ED Provider Notes (Signed)
CSN: [id number]     Arrival date & time 03/13/15  1506 History   First MD Initiated Contact with Patient 03/13/15 1657     Chief Complaint  Patient presents with  . Abdominal Pain  . Nausea   Austin [name] is a 24 y.o. male with a history of sickle cell trait and eczema who presents to the ED complaining of multiple complaints including feeling generally fatigued, nausea, intermittent LLQ abdominal pain, intermittent right chest and shoulder pain, sore throat, and bilateral low back pain for the past two days. Patient reports he has not felt like going to work because of his fatigue. He reports decreased appetite and has not been eating in the past 2 days.  He reports drinking water. He reports body aches. He reports chills, but no fevers. He complains of positional lightheadedness. He reports his last bowel movement was yesterday and had a loose stool. He denies current abdominal or chest pain. The patient denies fevers, sick contacts, rashes, headache, chest pain, shortness breath, palpitations, cough, wheezing, shortness of breath, vomiting, hematochezia, hematemesis, urinary symptoms, or previous abdominal surgeries. The patient denies any urinary symptoms, penile discharge, penile tenderness, testicular tenderness or genital rashes or lesions.  (Consider location/radiation/quality/duration/timing/severity/associated sxs/prior Treatment) HPI  Past Medical History  Diagnosis Date  . Sickle cell trait   . Eczema    History reviewed. No pertinent past surgical history. History reviewed. No pertinent family history. Social History  Substance Use Topics  . Smoking status: Current Every Day Smoker -- 0.10 packs/day    Types: Cigarettes  . Smokeless tobacco: None  . Alcohol Use: Yes    Review of Systems  Constitutional: Positive for chills, appetite change and fatigue. Negative for fever.  HENT: Positive for sore throat. Negative for congestion, ear pain, mouth sores, postnasal drip,  rhinorrhea, sneezing and trouble swallowing.   Eyes: Negative for pain and visual disturbance.  Respiratory: Negative for cough, shortness of breath and wheezing.   Cardiovascular: Positive for chest pain. Negative for palpitations and leg swelling.  Gastrointestinal: Positive for nausea and abdominal pain. Negative for vomiting, diarrhea, constipation and blood in stool.  Genitourinary: Negative for dysuria, urgency, frequency, hematuria, flank pain, decreased urine volume, discharge, penile swelling, scrotal swelling, difficulty urinating, genital sores, penile pain and testicular pain.  Musculoskeletal: Positive for back pain. Negative for neck pain.  Skin: Negative for rash.  Neurological: Positive for light-headedness. Negative for dizziness, weakness, numbness and headaches.      Allergies  Review of patient's allergies indicates no known allergies.  Home Medications   Prior to Admission medications   Medication Sig Start Date End Date Taking? Authorizing Provider  ibuprofen (ADVIL,MOTRIN) 800 MG tablet Take 1 tablet (800 mg total) by mouth 3 (three) times daily. Patient not taking: Reported on 03/13/2015 03/06/14   Santiago Glad, PA-C  methocarbamol (ROBAXIN) 500 MG tablet Take 1 tablet (500 mg total) by mouth 2 (two) times daily. Patient not taking: Reported on 03/13/2015 03/06/14   Santiago Glad, PA-C  naproxen (NAPROSYN) 250 MG tablet Take 1 tablet (250 mg total) by mouth 2 (two) times daily with a meal. 03/13/15   Everlene Farrier, PA-C  oxyCODONE-acetaminophen (PERCOCET/ROXICET) 5-325 MG per tablet Take 1 tablet by mouth every 4 (four) hours as needed. Patient not taking: Reported on 03/13/2015 05/31/14   Garlon Hatchet, PA-C  traMADol (ULTRAM) 50 MG tablet Take 1 tablet (50 mg total) by mouth every 6 (six) hours as needed. Patient not taking: Reported on 03/13/2015 03/06/14  Heather Laisure, PA-C   BP 115/75 mmHg  Pulse 90  Temp(Src) 99.2 F (37.3 C) (Oral)  Resp 19  SpO2  99% Physical Exam  Constitutional: He is oriented to person, place, and time. He appears well-developed and well-nourished. No distress.  Nontoxic appearing.  HENT:  Head: Normocephalic and atraumatic.  Right Ear: External ear normal.  Left Ear: External ear normal.  Mouth/Throat: Oropharynx is clear and moist. No oropharyngeal exudate.  Mild bilateral tonsillar hypertrophy without exudates. Uvula is midline without edema. Tongue protrusion is normal.  Eyes: Conjunctivae are normal. Pupils are equal, round, and reactive to light. Right eye exhibits no discharge. Left eye exhibits no discharge.  Neck: Normal range of motion. Neck supple. No JVD present. No tracheal deviation present.  Mild bilateral cervical lymphadenopathy tenderness to palpation.   Cardiovascular: Normal rate, regular rhythm, normal heart sounds and intact distal pulses.  Exam reveals no gallop and no friction rub.   No murmur heard. Pulmonary/Chest: Effort normal and breath sounds normal. No respiratory distress. He has no wheezes. He has no rales. He exhibits no tenderness.  Lungs are clear to auscultation bilaterally. No chest wall tenderness.  Abdominal: Soft. Bowel sounds are normal. He exhibits no distension and no mass. There is tenderness. There is no rebound and no guarding.  Abdomen is soft. Bowel sounds are present. Patient has mild left lower quadrant abdominal tenderness to palpation. Negative McBurney's point tenderness. No right upper quadrant tenderness. Negative psoas and obturator sign. No CVA tenderness.  Musculoskeletal: He exhibits no edema or tenderness.  No lower extremity edema tenderness.  Lymphadenopathy:    He has cervical adenopathy.  Neurological: He is alert and oriented to person, place, and time. Coordination normal.  Skin: Skin is warm and dry. No rash noted. He is not diaphoretic. No erythema. No pallor.  Psychiatric: He has a normal mood and affect. His behavior is normal.  Nursing note  and vitals reviewed.   ED Course  Procedures (including critical care time) Labs Review Labs Reviewed  LIPASE, BLOOD - Abnormal; Notable for the following:    Lipase 14 (*)    All other components within normal limits  COMPREHENSIVE METABOLIC PANEL - Abnormal; Notable for the following:    Sodium 134 (*)    Chloride 98 (*)    Total Protein 8.9 (*)    Total Bilirubin 3.6 (*)    All other components within normal limits  CBC - Abnormal; Notable for the following:    HCT 36.6 (*)    Platelets 93 (*)    All other components within normal limits  URINALYSIS, ROUTINE W REFLEX MICROSCOPIC (NOT AT [facility]) - Abnormal; Notable for the following:    Color, Urine ORANGE (*)    APPearance CLOUDY (*)    Specific Gravity, Urine 1.037 (*)    Bilirubin Urine MODERATE (*)    Ketones, ur 40 (*)    Protein, ur 30 (*)    Urobilinogen, UA 4.0 (*)    Nitrite POSITIVE (*)    Leukocytes, UA SMALL (*)    All other components within normal limits  URINE MICROSCOPIC-ADD ON - Abnormal; Notable for the following:    Bacteria, UA FEW (*)    All other components within normal limits  RAPID STREP SCREEN (NOT AT [facility])  CULTURE, GROUP A STREP  URINE CULTURE  MONONUCLEOSIS SCREEN  GC/CHLAMYDIA PROBE AMP ([place]) NOT AT [facility]    Imaging Review No results found. I have personally reviewed and evaluated these lab  results as part of my medical decision-making.   EKG Interpretation   Date/Time:  Thursday March 13 2015 17:44:11 EDT Ventricular Rate:  80 PR Interval:  119 QRS Duration: 84 QT Interval:  338 QTC Calculation: 390 R Axis:   66 Text Interpretation:  Sinus rhythm Borderline short PR interval Probable  inferior infarct, old Anterolateral Q wave, probably normal for age No  significant change since last tracing Confirmed by KNAPP  MD-J, JON  [phone]) on 03/13/2015 5:50:29 PM      Filed Vitals:   03/13/15 1545 03/13/15 1842 03/13/15 2045  BP: 118/69 113/63 115/75  Pulse: 91 82 90   Temp: 98.6 F (37 C) 99.2 F (37.3 C)   TempSrc: Oral Oral   Resp: 20 19   SpO2: 98% 100% 99%     MDM   Meds given in ED:  Medications  ondansetron (ZOFRAN) injection 4 mg (4 mg Intravenous Given 03/13/15 1737)  sodium chloride 0.9 % bolus 1,000 mL (0 mLs Intravenous Stopped 03/13/15 1840)    Discharge Medication List as of 03/13/2015  8:30 PM    START taking these medications   Details  naproxen (NAPROSYN) 250 MG tablet Take 1 tablet (250 mg total) by mouth 2 (two) times daily with a meal., Starting 03/13/2015, Until Discontinued, Print        Final diagnoses:  Left lower quadrant pain  Nausea  Sore throat  Hyperbilirubinemia   This is a 24 y.o. male with a history of sickle cell trait and eczema who presents to the ED complaining of multiple complaints including feeling generally fatigued, nausea, intermittent LLQ abdominal pain, intermittent right chest and shoulder pain, sore throat, and bilateral low back pain for the past two days. The patient denies any urinary symptoms. He reports chills but no fevers. He denies sick contacts. He denies any upper abdominal pain. He denies any vomiting. On exam the patient is afebrile nontoxic appearing. The patient's abdomen is soft has mild left lower quadrant abdominal tenderness to palpation. No peritoneal signs. No right upper quadrant tenderness to palpation. He has mild tonsillar hypertrophy without exudates. His lungs are clear to auscultation bilaterally. He has no chest wall tenderness. Rapid strep screen is negative. Mono screen is negative. Lipase is 14. CMP shows an elevated total bilirubin at 3.6. I discussed this finding with the patient and encouraged him to have this followed up with the 5 his primary care provider. His CBC is unremarkable. Urinalysis shows nitrite positive urine with small leukocytes with few bacteria. After discussing with my attending Dr. Lynelle Doctor, he feels that since the patient has no urinary symptoms we should  wait for his urine culture prior to treating him with antibiotics. Urine sent for culture. Urine gonorrhea and chlamydia were ordered. I advised patient that these tests are pending and he needs to follow up with them. He denies any canal discharge or symptoms of chlamydia. Patient received fluid bolus and Zofran in the ED. Afterwards the patient reports he ate a sandwich and tolerated ginger ale. He had no nausea or vomiting. He reports feeling better and feels ready for discharge. We'll discharge patient prescription for naproxen and have him follow-up with his primary care provider. Strict return precautions provided. I advised the patient to follow-up with their primary care provider this week. I advised the patient to return to the emergency department with new or worsening symptoms or new concerns. The patient verbalized understanding and agreement with plan.    This patient was discussed with  Dr. Lynelle Doctor who agrees with assessment and plan.     Everlene Farrier, PA-C 03/13/15 2252  Linwood Dibbles, MD 03/14/15 [phone]

## 2015-03-13 NOTE — Discharge Instructions (Signed)
Please have your bilirubin rechecked by your primary care provider.  Fatigue Fatigue is a feeling of tiredness, lack of energy, lack of motivation, or feeling tired all the time. Having enough rest, good nutrition, and reducing stress will normally reduce fatigue. Consult your caregiver if it persists. The nature of your fatigue will help your caregiver to find out its cause. The treatment is based on the cause.  CAUSES  There are many causes for fatigue. Most of the time, fatigue can be traced to one or more of your habits or routines. Most causes fit into one or more of three general areas. They are: Lifestyle problems  Sleep disturbances.  Overwork.  Physical exertion.  Unhealthy habits.  Poor eating habits or eating disorders.  Alcohol and/or drug use .  Lack of proper nutrition (malnutrition). Psychological problems  Stress and/or anxiety problems.  Depression.  Grief.  Boredom. Medical Problems or Conditions  Anemia.  Pregnancy.  Thyroid gland problems.  Recovery from major surgery.  Continuous pain.  Emphysema or asthma that is not well controlled  Allergic conditions.  Diabetes.  Infections (such as mononucleosis).  Obesity.  Sleep disorders, such as sleep apnea.  Heart failure or other heart-related problems.  Cancer.  Kidney disease.  Liver disease.  Effects of certain medicines such as antihistamines, cough and cold remedies, prescription pain medicines, heart and blood pressure medicines, drugs used for treatment of cancer, and some antidepressants. SYMPTOMS  The symptoms of fatigue include:   Lack of energy.  Lack of drive (motivation).  Drowsiness.  Feeling of indifference to the surroundings. DIAGNOSIS  The details of how you feel help guide your caregiver in finding out what is causing the fatigue. You will be asked about your present and past health condition. It is important to review all medicines that you take, including  prescription and non-prescription items. A thorough exam will be done. You will be questioned about your feelings, habits, and normal lifestyle. Your caregiver may suggest blood tests, urine tests, or other tests to look for common medical causes of fatigue.  TREATMENT  Fatigue is treated by correcting the underlying cause. For example, if you have continuous pain or depression, treating these causes will improve how you feel. Similarly, adjusting the dose of certain medicines will help in reducing fatigue.  HOME CARE INSTRUCTIONS   Try to get the required amount of good sleep every night.  Eat a healthy and nutritious diet, and drink enough water throughout the day.  Practice ways of relaxing (including yoga or meditation).  Exercise regularly.  Make plans to change situations that cause stress. Act on those plans so that stresses decrease over time. Keep your work and personal routine reasonable.  Avoid street drugs and minimize use of alcohol.  Start taking a daily multivitamin after consulting your caregiver. SEEK MEDICAL CARE IF:   You have persistent tiredness, which cannot be accounted for.  You have fever.  You have unintentional weight loss.  You have headaches.  You have disturbed sleep throughout the night.  You are feeling sad.  You have constipation.  You have dry skin.  You have gained weight.  You are taking any new or different medicines that you suspect are causing fatigue.  You are unable to sleep at night.  You develop any unusual swelling of your legs or other parts of your body. SEEK IMMEDIATE MEDICAL CARE IF:   You are feeling confused.  Your vision is blurred.  You feel faint or pass out.  You develop severe headache.  You develop severe abdominal, pelvic, or back pain.  You develop chest pain, shortness of breath, or an irregular or fast heartbeat.  You are unable to pass a normal amount of urine.  You develop abnormal bleeding such  as bleeding from the rectum or you vomit blood.  You have thoughts about harming yourself or committing suicide.  You are worried that you might harm someone else. MAKE SURE YOU:   Understand these instructions.  Will watch your condition.  Will get help right away if you are not doing well or get worse. Document Released: 05/09/2007 Document Revised: 10/04/2011 Document Reviewed: 11/13/2013 [facility] Patient Information 2015 [place], [place]. This information is not intended to replace advice given to you by your health care provider. Make sure you discuss any questions you have with your health care provider.  Abdominal Pain Many things can cause belly (abdominal) pain. Most times, the belly pain is not dangerous. Many cases of belly pain can be watched and treated at home. HOME CARE   Do not take medicines that help you go poop (laxatives) unless told to by your doctor.  Only take medicine as told by your doctor.  Eat or drink as told by your doctor. Your doctor will tell you if you should be on a special diet. GET HELP IF:  You do not know what is causing your belly pain.  You have belly pain while you are sick to your stomach (nauseous) or have runny poop (diarrhea).  You have pain while you pee or poop.  Your belly pain wakes you up at night.  You have belly pain that gets worse or better when you eat.  You have belly pain that gets worse when you eat fatty foods.  You have a fever. GET HELP RIGHT AWAY IF:   The pain does not go away within 2 hours.  You keep throwing up (vomiting).  The pain changes and is only in the right or left part of the belly.  You have bloody or tarry looking poop. MAKE SURE YOU:   Understand these instructions.  Will watch your condition.  Will get help right away if you are not doing well or get worse. Document Released: 12/29/2007 Document Revised: 07/17/2013 Document Reviewed: 03/21/2013 [facility] Patient Information 2015  [place], [place]. This information is not intended to replace advice given to you by your health care provider. Make sure you discuss any questions you have with your health care provider. Nausea and Vomiting Nausea is a sick feeling that often comes before throwing up (vomiting). Vomiting is a reflex where stomach contents come out of your mouth. Vomiting can cause severe loss of body fluids (dehydration). Children and elderly adults can become dehydrated quickly, especially if they also have diarrhea. Nausea and vomiting are symptoms of a condition or disease. It is important to find the cause of your symptoms. CAUSES   Direct irritation of the stomach lining. This irritation can result from increased acid production (gastroesophageal reflux disease), infection, food poisoning, taking certain medicines (such as nonsteroidal anti-inflammatory drugs), alcohol use, or tobacco use.  Signals from the brain.These signals could be caused by a headache, heat exposure, an inner ear disturbance, increased pressure in the brain from injury, infection, a tumor, or a concussion, pain, emotional stimulus, or metabolic problems.  An obstruction in the gastrointestinal tract (bowel obstruction).  Illnesses such as diabetes, hepatitis, gallbladder problems, appendicitis, kidney problems, cancer, sepsis, atypical symptoms of a heart attack, or eating  disorders.  Medical treatments such as chemotherapy and radiation.  Receiving medicine that makes you sleep (general anesthetic) during surgery. DIAGNOSIS Your caregiver may ask for tests to be done if the problems do not improve after a few days. Tests may also be done if symptoms are severe or if the reason for the nausea and vomiting is not clear. Tests may include:  Urine tests.  Blood tests.  Stool tests.  Cultures (to look for evidence of infection).  X-rays or other imaging studies. Test results can help your caregiver make decisions about treatment or  the need for additional tests. TREATMENT You need to stay well hydrated. Drink frequently but in small amounts.You may wish to drink water, sports drinks, clear broth, or eat frozen ice pops or gelatin dessert to help stay hydrated.When you eat, eating slowly may help prevent nausea.There are also some antinausea medicines that may help prevent nausea. HOME CARE INSTRUCTIONS   Take all medicine as directed by your caregiver.  If you do not have an appetite, do not force yourself to eat. However, you must continue to drink fluids.  If you have an appetite, eat a normal diet unless your caregiver tells you differently.  Eat a variety of complex carbohydrates (rice, wheat, potatoes, bread), lean meats, yogurt, fruits, and vegetables.  Avoid high-fat foods because they are more difficult to digest.  Drink enough water and fluids to keep your urine clear or pale yellow.  If you are dehydrated, ask your caregiver for specific rehydration instructions. Signs of dehydration may include:  Severe thirst.  Dry lips and mouth.  Dizziness.  Dark urine.  Decreasing urine frequency and amount.  Confusion.  Rapid breathing or pulse. SEEK IMMEDIATE MEDICAL CARE IF:   You have blood or brown flecks (like coffee grounds) in your vomit.  You have black or bloody stools.  You have a severe headache or stiff neck.  You are confused.  You have severe abdominal pain.  You have chest pain or trouble breathing.  You do not urinate at least once every 8 hours.  You develop cold or clammy skin.  You continue to vomit for longer than 24 to 48 hours.  You have a fever. MAKE SURE YOU:   Understand these instructions.  Will watch your condition.  Will get help right away if you are not doing well or get worse. Document Released: 07/12/2005 Document Revised: 10/04/2011 Document Reviewed: 12/09/2010 [facility] Patient Information 2015 [place], [place]. This information is not intended to  replace advice given to you by your health care provider. Make sure you discuss any questions you have with your health care provider.

## 2015-03-14 LAB — GC/CHLAMYDIA PROBE AMP (~~LOC~~) NOT AT ARMC
Chlamydia: NEGATIVE
Neisseria Gonorrhea: NEGATIVE

## 2015-03-15 LAB — URINE CULTURE: Culture: NO GROWTH

## 2015-03-16 LAB — CULTURE, GROUP A STREP

## 2015-07-21 ENCOUNTER — Emergency Department (HOSPITAL_COMMUNITY): Payer: Self-pay

## 2015-07-21 ENCOUNTER — Emergency Department (HOSPITAL_COMMUNITY)
Admission: EM | Admit: 2015-07-21 | Discharge: 2015-07-21 | Disposition: A | Discharge status: Home / Self Care | Payer: Self-pay | Attending: Emergency Medicine | Admitting: Emergency Medicine

## 2015-07-21 ENCOUNTER — Encounter (HOSPITAL_COMMUNITY): Payer: Self-pay | Admitting: *Deleted

## 2015-07-21 DIAGNOSIS — Z872 Personal history of diseases of the skin and subcutaneous tissue: Secondary | ICD-10-CM | POA: Insufficient documentation

## 2015-07-21 DIAGNOSIS — F17210 Nicotine dependence, cigarettes, uncomplicated: Secondary | ICD-10-CM | POA: Insufficient documentation

## 2015-07-21 DIAGNOSIS — Z79899 Other long term (current) drug therapy: Secondary | ICD-10-CM | POA: Insufficient documentation

## 2015-07-21 DIAGNOSIS — Z862 Personal history of diseases of the blood and blood-forming organs and certain disorders involving the immune mechanism: Secondary | ICD-10-CM | POA: Insufficient documentation

## 2015-07-21 DIAGNOSIS — M10071 Idiopathic gout, right ankle and foot: Secondary | ICD-10-CM | POA: Insufficient documentation

## 2015-07-21 DIAGNOSIS — M109 Gout, unspecified: Secondary | ICD-10-CM

## 2015-07-21 MED ORDER — INDOMETHACIN 25 MG PO CAPS: 50.0000 mg | ORAL_CAPSULE | Freq: Three times a day (TID) | ORAL | Status: DC | PRN

## 2015-07-21 MED ORDER — HYDROCODONE-ACETAMINOPHEN 5-325 MG PO TABS
1.0000 | ORAL_TABLET | Freq: Once | ORAL | Status: AC
  Administered 2015-07-21: 1 via ORAL
  Filled 2015-07-21: qty 1

## 2015-07-21 NOTE — ED Provider Notes (Signed)
CSN: [id number]     Arrival date & time 07/21/15  4098 History  By signing my name below, I, Doreatha Martin, attest that this documentation has been prepared under the direction and in the presence of [employer], PA-C.  Electronically Signed: Doreatha Martin, ED Scribe. 07/21/2015. 9:30 AM.        Chief Complaint  Patient presents with  . Foot Pain   The history is provided by the patient. No language interpreter was used.    HPI Comments: Austin [name] is a 24 y.o. male who presents to the Emergency Department complaining of moderate pain to the dorsal aspect of the right foot proximal to the toes onset 3 days ago and worsened yesterday. He states associated redness, swelling and intermittent calor to the area. He reports that his pain and swelling is relieved with ice. No h/o similar symptoms or gout. No known trauma, injury, falls. Pt reports he eats red meat, seafood and alcohol, but not excessively. Pt is not currently followed by a PCP. He denies fever, chills, ankle pain.   Past Medical History  Diagnosis Date  . Sickle cell trait (HCC)   . Eczema    History reviewed. No pertinent past surgical history. History reviewed. No pertinent family history. Social History  Substance Use Topics  . Smoking status: Current Every Day Smoker -- 0.10 packs/day    Types: Cigarettes  . Smokeless tobacco: None  . Alcohol Use: Yes    Review of Systems  Constitutional: Negative for fever and chills.  Musculoskeletal: Positive for joint swelling and arthralgias.  Skin: Positive for color change.  All other systems reviewed and are negative.  Allergies  Review of patient's allergies indicates no known allergies.  Home Medications   Prior to Admission medications   Medication Sig Start Date End Date Taking? Authorizing Provider  ibuprofen (ADVIL,MOTRIN) 800 MG tablet Take 1 tablet (800 mg total) by mouth 3 (three) times daily. Patient not taking: Reported on 03/13/2015 03/06/14    Santiago Glad, PA-C  indomethacin (INDOCIN) 25 MG capsule Take 2 capsules (50 mg total) by mouth 3 (three) times daily as needed. 07/21/15   Berlin Viereck Tripp Lillyonna Armstead, PA-C  methocarbamol (ROBAXIN) 500 MG tablet Take 1 tablet (500 mg total) by mouth 2 (two) times daily. Patient not taking: Reported on 03/13/2015 03/06/14   Santiago Glad, PA-C  naproxen (NAPROSYN) 250 MG tablet Take 1 tablet (250 mg total) by mouth 2 (two) times daily with a meal. 03/13/15   Everlene Farrier, PA-C  oxyCODONE-acetaminophen (PERCOCET/ROXICET) 5-325 MG per tablet Take 1 tablet by mouth every 4 (four) hours as needed. Patient not taking: Reported on 03/13/2015 05/31/14   Garlon Hatchet, PA-C  traMADol (ULTRAM) 50 MG tablet Take 1 tablet (50 mg total) by mouth every 6 (six) hours as needed. Patient not taking: Reported on 03/13/2015 03/06/14   Santiago Glad, PA-C   BP 113/68 mmHg  Pulse 71  Temp(Src) 98.2 F (36.8 C) (Oral)  Resp 16  Ht 5' 7.5" (1.715 m)  Wt 158 lb (71.668 kg)  BMI 24.37 kg/m2  SpO2 100% Physical Exam  Constitutional: He is oriented to person, place, and time. He appears well-developed and well-nourished. No distress.  HENT:  Head: Normocephalic and atraumatic.  Eyes: Conjunctivae are normal. Right eye exhibits no discharge. Left eye exhibits no discharge. No scleral icterus.  Cardiovascular: Normal rate and intact distal pulses.   Intact DPs.  Pulmonary/Chest: Effort normal.  Musculoskeletal: He exhibits tenderness. He exhibits no edema.  TTP over the dorsal aspect of the right foot. Area of mild erythema. No warmth, no decreased ROM of toes or ankle. No edema or obvious bony deformity.   Neurological: He is alert and oriented to person, place, and time. Coordination normal.  No sensory deficits.   Skin: Skin is warm and dry. No rash noted. He is not diaphoretic. No erythema. No pallor.  Psychiatric: He has a normal mood and affect. His behavior is normal.  Nursing note and vitals  reviewed.  ED Course  Procedures (including critical care time) DIAGNOSTIC STUDIES: Oxygen Saturation is 100% on RA, normal by my interpretation.    COORDINATION OF CARE: 9:25 AM Discussed treatment plan with pt at bedside which includes XR and pt agreed to plan.   Imaging Review Dg Foot Complete Right  07/21/2015  CLINICAL DATA:  Pain redness and swelling about the third through fifth metatarsals of the right foot for 2 days. No known injury. Initial encounter. EXAM: RIGHT FOOT COMPLETE - 3+ VIEW COMPARISON:  Plain films the right foot 08/22/2010. FINDINGS: There is no evidence of fracture or dislocation. There is no evidence of arthropathy or other focal bone abnormality. Soft tissues are unremarkable. IMPRESSION: Normal examination. Electronically Signed   By: Drusilla Kannerhomas  Dalessio M.D.   On: 07/21/2015 10:02   I have personally reviewed and evaluated these images as part of my medical decision-making.  MDM   Final diagnoses:  Acute gout of right foot, unspecified cause   Pt presents with monoarticular pain, swelling and erythema.  Pt is afebrile and stable. Imaging reviewed, no evidence of occult fracture or injury. Pt without known peptic ulcer disease and not receiving concurrent treatment on warfarin. Pt dc with indomethacin (50 mg PO TID) and crutches. Discussed that pt should respond to treatment with in 24 hour of begining treatment & likely resolve in 2-3 days. Pain managed in the ED with Vicodin. Advised pt to f/u with PCP. Resource guide given. Return precautions outlined in patient discharge instructions.    I personally performed the services described in this documentation, which was scribed in my presence. The recorded information has been reviewed and is accurate.     Lester KinsmanSamantha Tripp TampaDowless, PA-C 07/21/15 1019  Linwood DibblesJon Knapp, MD 07/22/15 [id number]

## 2015-07-21 NOTE — Discharge Instructions (Signed)
Gout °Gout is an inflammatory arthritis caused by a buildup of uric acid crystals in the joints. Uric acid is a chemical that is normally present in the blood. When the level of uric acid in the blood is too high it can form crystals that deposit in your joints and tissues. This causes joint redness, soreness, and swelling (inflammation). Repeat attacks are common. Over time, uric acid crystals can form into masses (tophi) near a joint, destroying bone and causing disfigurement. Gout is treatable and often preventable. °CAUSES  °The disease begins with elevated levels of uric acid in the blood. Uric acid is produced by your body when it breaks down a naturally found substance called purines. Certain foods you eat, such as meats and fish, contain high amounts of purines. Causes of an elevated uric acid level include: °· Being passed down from parent to child (heredity). °· Diseases that cause increased uric acid production (such as obesity, psoriasis, and certain cancers). °· Excessive alcohol use. °· Diet, especially diets rich in meat and seafood. °· Medicines, including certain cancer-fighting medicines (chemotherapy), water pills (diuretics), and aspirin. °· Chronic kidney disease. The kidneys are no longer able to remove uric acid well. °· Problems with metabolism. °Conditions strongly associated with gout include: °· Obesity. °· High blood pressure. °· High cholesterol. °· Diabetes. °Not everyone with elevated uric acid levels gets gout. It is not understood why some people get gout and others do not. Surgery, joint injury, and eating too much of certain foods are some of the factors that can lead to gout attacks. °SYMPTOMS  °· An attack of gout comes on quickly. It causes intense pain with redness, swelling, and warmth in a joint. °· Fever can occur. °· Often, only one joint is involved. Certain joints are more commonly involved: °· Base of the big toe. °· Knee. °· Ankle. °· Wrist. °· Finger. °Without  treatment, an attack usually goes away in a few days to weeks. Between attacks, you usually will not have symptoms, which is different from many other forms of arthritis. °DIAGNOSIS  °Your caregiver will suspect gout based on your symptoms and exam. In some cases, tests may be recommended. The tests may include: °· Blood tests. °· Urine tests. °· X-rays. °· Joint fluid exam. This exam requires a needle to remove fluid from the joint (arthrocentesis). Using a microscope, gout is confirmed when uric acid crystals are seen in the joint fluid. °TREATMENT  °There are two phases to gout treatment: treating the sudden onset (acute) attack and preventing attacks (prophylaxis). °· Treatment of an Acute Attack. °· Medicines are used. These include anti-inflammatory medicines or steroid medicines. °· An injection of steroid medicine into the affected joint is sometimes necessary. °· The painful joint is rested. Movement can worsen the arthritis. °· You may use warm or cold treatments on painful joints, depending which works best for you. °· Treatment to Prevent Attacks. °· If you suffer from frequent gout attacks, your caregiver may advise preventive medicine. These medicines are started after the acute attack subsides. These medicines either help your kidneys eliminate uric acid from your body or decrease your uric acid production. You may need to stay on these medicines for a very long time. °· The early phase of treatment with preventive medicine can be associated with an increase in acute gout attacks. For this reason, during the first few months of treatment, your caregiver may also advise you to take medicines usually used for acute gout treatment. Be sure you   understand your caregiver's directions. Your caregiver may make several adjustments to your medicine dose before these medicines are effective.  Discuss dietary treatment with your caregiver or dietitian. Alcohol and drinks high in sugar and fructose and foods  such as meat, poultry, and seafood can increase uric acid levels. Your caregiver or dietitian can advise you on drinks and foods that should be limited. HOME CARE INSTRUCTIONS   Do not take aspirin to relieve pain. This raises uric acid levels.  Only take over-the-counter or prescription medicines for pain, discomfort, or fever as directed by your caregiver.  Rest the joint as much as possible. When in bed, keep sheets and blankets off painful areas.  Keep the affected joint raised (elevated).  Apply warm or cold treatments to painful joints. Use of warm or cold treatments depends on which works best for you.  Use crutches if the painful joint is in your leg.  Drink enough fluids to keep your urine clear or pale yellow. This helps your body get rid of uric acid. Limit alcohol, sugary drinks, and fructose drinks.  Follow your dietary instructions. Pay careful attention to the amount of protein you eat. Your daily diet should emphasize fruits, vegetables, whole grains, and fat-free or low-fat milk products. Discuss the use of coffee, vitamin C, and cherries with your caregiver or dietitian. These may be helpful in lowering uric acid levels.  Maintain a healthy body weight. SEEK MEDICAL CARE IF:   You develop diarrhea, vomiting, or any side effects from medicines.  You do not feel better in 24 hours, or you are getting worse. SEEK IMMEDIATE MEDICAL CARE IF:   Your joint becomes suddenly more tender, and you have chills or a fever. MAKE SURE YOU:   Understand these instructions.  Will watch your condition.  Will get help right away if you are not doing well or get worse.   This information is not intended to replace advice given to you by your health care provider. Make sure you discuss any questions you have with your health care provider.   Document Released: 07/09/2000 Document Revised: 08/02/2014 Document Reviewed: 02/23/2012 Elsevier Interactive Patient Education 2016  [place] are compounds that affect the level of uric acid in your body. A low-purine diet is a diet that is low in purines. Eating a low-purine diet can prevent the level of uric acid in your body from getting too high and causing gout or kidney stones or both. WHAT DO I NEED TO KNOW ABOUT THIS DIET?  Choose low-purine foods. Examples of low-purine foods are listed in the next section.  Drink plenty of fluids, especially water. Fluids can help remove uric acid from your body. Try to drink 8-16 cups (1.9-3.8 L) a day.  Limit foods high in fat, especially saturated fat, as fat makes it harder for the body to get rid of uric acid. Foods high in saturated fat include pizza, cheese, ice cream, whole milk, fried foods, and gravies. Choose foods that are lower in fat and lean sources of protein. Use olive oil when cooking as it contains healthy fats that are not high in saturated fat.  Limit alcohol. Alcohol interferes with the elimination of uric acid from your body. If you are having a gout attack, avoid all alcohol.  Keep in mind that different people's bodies react differently to different foods. You will probably learn over time which foods do or do not affect you. If you discover that a food tends to  cause your gout to flare up, avoid eating that food. You can more freely enjoy foods that do not cause problems. If you have any questions about a food item, talk to your dietitian or health care provider. WHICH FOODS ARE LOW, MODERATE, AND HIGH IN PURINES? The following is a list of foods that are low, moderate, and high in purines. You can eat any amount of the foods that are low in purines. You may be able to have small amounts of foods that are moderate in purines. Ask your health care provider how much of a food moderate in purines you can have. Avoid foods high in purines. Grains  Foods low in purines: Enriched white bread, pasta, rice, cake, cornbread,  popcorn.  Foods moderate in purines: Whole-grain breads and cereals, wheat germ, bran, oatmeal. Uncooked oatmeal. Dry wheat bran or wheat germ.  Foods high in purines: Pancakes, [place] toast, biscuits, muffins. Vegetables  Foods low in purines: All vegetables, except those that are moderate in purines.  Foods moderate in purines: Asparagus, cauliflower, spinach, mushrooms, green peas. Fruits  All fruits are low in purines. Meats and other Protein Foods  Foods low in purines: Eggs, nuts, peanut butter.  Foods moderate in purines: 80-90% lean beef, lamb, veal, pork, poultry, fish, eggs, peanut butter, nuts. Crab, lobster, oysters, and shrimp. Cooked dried beans, peas, and lentils.  Foods high in purines: Anchovies, sardines, herring, mussels, tuna, codfish, scallops, trout, and haddock. [name]. Organ meats (such as liver or kidney). Tripe. Game meat. Goose. Sweetbreads. Dairy  All dairy foods are low in purines. Low-fat and fat-free dairy products are best because they are low in saturated fat. Beverages  Drinks low in purines: Water, carbonated beverages, tea, coffee, cocoa.  Drinks moderate in purines: Soft drinks and other drinks sweetened with high-fructose corn syrup. Juices. To find whether a food or drink is sweetened with high-fructose corn syrup, look at the ingredients list.  Drinks high in purines: Alcoholic beverages (such as beer). Condiments  Foods low in purines: Salt, herbs, olives, pickles, relishes, vinegar.  Foods moderate in purines: Butter, margarine, oils, mayonnaise. Fats and Oils  Foods low in purines: All types, except gravies and sauces made with meat.  Foods high in purines: Gravies and sauces made with meat. Other Foods  Foods low in purines: Sugars, sweets, gelatin. Cake. Soups made without meat.  Foods moderate in purines: Meat-based or fish-based soups, broths, or bouillons. Foods and drinks sweetened with high-fructose corn syrup.  Foods high  in purines: High-fat desserts (such as ice cream, cookies, cakes, pies, doughnuts, and chocolate). Contact your dietitian for more information on foods that are not listed here.   This information is not intended to replace advice given to you by your health care provider. Make sure you discuss any questions you have with your health care provider.    Emergency Department Resource Guide 1) Find a Doctor and Pay Out of Pocket Although you won't have to find out who is covered by your insurance plan, it is a good idea to ask around and get recommendations. You will then need to call the office and see if the doctor you have chosen will accept you as a new patient and what types of options they offer for patients who are self-pay. Some doctors offer discounts or will set up payment plans for their patients who do not have insurance, but you will need to ask so you aren't surprised when you get to your appointment.  2) Contact Your  Local Health Department Not all health departments have doctors that can see patients for sick visits, but many do, so it is worth a call to see if yours does. If you don't know where your local health department is, you can check in your phone book. The CDC also has a tool to help you locate your state's health department, and many state websites also have listings of all of their local health departments.  3) Find a Walk-in Clinic If your illness is not likely to be very severe or complicated, you may want to try a walk in clinic. These are popping up all over the country in pharmacies, drugstores, and shopping centers. They're usually staffed by nurse practitioners or physician assistants that have been trained to treat common illnesses and complaints. They're usually fairly quick and inexpensive. However, if you have serious medical issues or chronic medical problems, these are probably not your best option.  No Primary Care Doctor: - Call Health Connect at  [phone] -  they can help you locate a primary care doctor that  accepts your insurance, provides certain services, etc. - Physician Referral Service- [phone]  Chronic Pain Problems: Organization         Address  Phone   Notes  [facility]  [phone] Patients need to be referred by their primary care doctor.   Medication Assistance: Organization         Address  Phone   Notes  [facility] Medication [facility] [address] [place], [place] [address] [phone] --Must be a resident of [facility] -- Must have NO insurance coverage whatsoever (no Medicaid/ Medicare, etc.) -- The pt. MUST have a primary care doctor that directs their care regularly and follows them in the community   [facility]  [phone]   [employer]  [phone]    Agencies that provide inexpensive medical care: Organization         Address  Phone   Notes  [facility]  [phone]   [facility]    [phone]   [facility] [address] [place], [place] [address] [phone]   [facility] [address] [place] [place] [phone]   Planned Parenthood    [phone]   [facility]    [phone]   [facility]  [address], [place] Phone:  [phone], Fax:  [phone] Hours of Operation:  9 am - 6 pm, M-F.  Also accepts Medicaid/Medicare and self-pay.  [facility]  [address], [place] Phone: [phone], Fax: [phone]. Hours of Operation:  8:30 am - 5:30 pm, M-F.  Also accepts Medicaid and self-pay.  [facility] [address] [place] [address] [place] Phone: [phone]   [facility] [address] Natasha Bence [place], [place] [phone], Ext. [phone] Mondays & Thursdays: 7-9 AM.  First 15 patients are seen on a first come, first serve basis.    Medicaid-accepting  [facility] Providers:  Organization         Address  Phone   Notes  [facility] [address], [place] [phone] Also accepts self-pay patients.  [facility] [address] Laurell Josephs [place], [place]  [phone]   [facility] [address], [address] [phone])  [phone]   [facility] [address], [place] [phone]   Renaye Rakers [address], [place]   [phone] Only accepts [place] patients after they have their name applied to their card.   Self-Pay (no insurance) in [facility]:  Organization         Address  Phone   Notes  Sickle Cell Patients, [facility] [address] [place], [place] [phone]   [facility] [address] [place], [place] [phone]   [facility] [place]  [address], [place] [phone]   [facility]/Dr. Osei-Bonsu  [address], [place] or [address], [place] [phone] Phone number for both [place] and [place] locations is the same.  [facility] and [facility] [address] [place] [phone]   [facility] [address], [place] or [address] [phone]   [facility] [address], [place] [phone], phone; [phone], fax Sees patients 1st and 3rd Saturday of every month.  Must not qualify for public or private insurance (i.e. Medicaid, Medicare, [place] Health Choice, Veterans' Benefits)  Household income should be no more than 200% of the poverty level The clinic cannot treat you if you are pregnant or think you are pregnant  Sexually transmitted diseases are not treated at the clinic.    Dental Care: Organization         Address  Phone  Notes  [facility] of [facility] [address] [place], [place] [phone] Accepts children up to age 62 who are enrolled in [place] or [place] Health Choice; pregnant women with a Medicaid card; and children who have applied for Medicaid or [place] Health Choice, but were declined, whose parents can pay a reduced fee at time of service.  [facility] Department of [facility]  [address], [place] [phone] Accepts children up to age 23 who are enrolled in [place] or [place] Health Choice; pregnant women with a Medicaid card; and children who have applied for Medicaid or [place] Health Choice, but were declined, whose parents can pay a reduced fee at time of service.  [facility]  [address] [place], [place] [phone] Patients are seen by appointment only. Walk-ins are not accepted. [facility] will see patients 16 years of age and older. Monday - Tuesday (8am-5pm) Most Wednesdays (8:30-5pm) $30 per visit, cash only  [facility]  [address], [facility] [phone] Patients are seen by appointment only. Walk-ins are not accepted. [facility] will see patients 22 years of age and older. One Wednesday Evening (Monthly: Volunteer Based).  $30 per visit, cash only  [employer]  [phone] for adults; Children under age 62, call Graduate Pediatric Dentistry at [phone]. Children aged 25-14, please call [phone] to request a pediatric application.  Dental services are provided in all areas of dental care including fillings, crowns and bridges, complete and partial dentures, implants, gum treatment, root canals, and extractions. Preventive care is also provided. Treatment is provided to both adults and children. Patients are selected via a lottery and there is often a waiting list.   [facility] [address] [place]  [phone]  [email or web address]   [facility] [address], [place], [place] [phone], Ext. [phone] Second and Fourth Thursday of each month, opens at 6:30 AM; Clinic ends at 9 AM.  Patients are seen on a first-come first-served basis, and a limited number are seen during each clinic.   [facility]  [address], [place], [place] [phone]   Eligibility Requirements You must have lived in [place], [place], or [place] counties for at least the last three months.   You cannot be eligible for state or federal sponsored [place]healthcare insurance, including CIGNAVeterans Administration, [place]icaid, or [employer]icare.   You generally cannot be eligible for healthcare insurance through your employer.    How to apply: Eligibility screenings are held every Tuesday and Wednesday afternoon from 1:00 pm until 4:00 pm. You do not need an appointment for the interview!  [facility] [address], [place], [place] [phone]   [facility]  [phone]   [facility]  [phone]   [facility]  [phone]    Behavioral Health Resources in the Community: Intensive Outpatient Programs Organization         Address  Phone  Notes  [facility] [address], [place], [place] [phone]   [facility] [address], [place], [place] [phone]   [facility] [address], [place], [place]  [phone]   [facility] [address],  [place], [place] [phone] or [phone]   Substance Abuse Resources Organization         Address  Phone  Notes  [facility]  [phone]   [facility]  [phone]   [facility]  [phone]   [facility]  [phone]   Residential & Outpatient Substance Abuse Program  [phone]   Psychological Services Organization         Address  Phone  Notes  [facility]   [phone]- [phone]   [facility]  [phone]   [facility] [address], [place] [phone] or [phone]    Mobile Crisis Teams Organization         Address  Phone  Notes  [facility], Mobile Crisis Care Unit  [phone]   [facility]  [address] [place], [place] [phone]   Doristine LocksSharon DeEsch [address] [place] [phone]    Self-Help/Support Groups Organization         Address  Phone             Notes  [facility] [place] - variety of support groups  [phone] Call for more information  Narcotics Anonymous (NA), [facility] [address], [place]  2 meetings at this location   Statisticianesidential Treatment Programs Organization         Address  Phone  Notes  [facility] [address],    [place]  [phone]   [facility]  [address], [place] [phone], [place], [place] [phone]   [facility] [address], [place] [phone] Admissions: 8am-3pm M-F  [facility] [address]    [place], [place] [phone]   [facility] [address], [place], [place] [phone]   [facility] [address]  [place], [place] [phone]   [facility] - Intensive  Outpatient [address], [place], [place] [phone]   [facility] ([facility] [address]  [place], [place] [phone] or [phone]   [facility] ([facility]) [address] [place], [place] [phone] Accepts Medicaid  [facility] [address]  [place] [phone] Substance Abuse/Addiction Treatment   [facility] Resources Organization         Address  Phone  Notes  [facility]  [phone]   Angie FavaJulie Brannon, PhD [address], [address] [place], [place]   [phone] or  [phone]   [facility] [place] [facility]   [name] [place], [place] [phone]   [facility] [address], [place], [place] [phone] Insurance/Medicaid/sponsorship through [facility] [facility] [address]                                    [place], [place] [phone] Therapy/tele-psych/case  [facility] [address]   [place], [place] [phone]) [phone]    Dr. Lolly MustacheArfeen  [phone]) [phone]   [facility]  [employer] [facility] 1) [address], [place] 2) [address], [place] 3)  [address], [place] [phone]) [phone]  [phone]) [phone]   [facility] Hot[facility] [phone]) [phone] or [phone] (After Hours)      Follow-up with a primary care provider if symptoms do not improve. Take indomethacin for pain. See attached for dietary recommendations to prevent gout. Return to the emergency department if you experience severe worsening of your symptoms, increased redness or swelling of the affected area, fever, chills, discoloration of your extremity.

## 2015-07-21 NOTE — ED Notes (Signed)
Pt reports foot pain started on Friday 07-18-15

## 2015-08-16 ENCOUNTER — Encounter (HOSPITAL_COMMUNITY): Payer: Self-pay | Admitting: Emergency Medicine

## 2015-08-16 ENCOUNTER — Emergency Department (INDEPENDENT_AMBULATORY_CARE_PROVIDER_SITE_OTHER)
Admission: EM | Admit: 2015-08-16 | Discharge: 2015-08-16 | Disposition: A | Discharge status: Home / Self Care | Payer: Self-pay | Source: Home / Self Care | Attending: Emergency Medicine | Admitting: Emergency Medicine

## 2015-08-16 DIAGNOSIS — S39012A Strain of muscle, fascia and tendon of lower back, initial encounter: Secondary | ICD-10-CM

## 2015-08-16 DIAGNOSIS — S8001XA Contusion of right knee, initial encounter: Secondary | ICD-10-CM

## 2015-08-16 MED ORDER — DICLOFENAC POTASSIUM 50 MG PO TABS: 50.0000 mg | ORAL_TABLET | Freq: Three times a day (TID) | ORAL | Status: DC
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
## 2015-09-21 ENCOUNTER — Encounter (HOSPITAL_COMMUNITY): Payer: Self-pay | Admitting: Emergency Medicine

## 2015-09-21 ENCOUNTER — Emergency Department (INDEPENDENT_AMBULATORY_CARE_PROVIDER_SITE_OTHER)
Admission: EM | Admit: 2015-09-21 | Discharge: 2015-09-21 | Disposition: A | Discharge status: Home / Self Care | Payer: Self-pay | Source: Home / Self Care | Attending: Emergency Medicine | Admitting: Emergency Medicine

## 2015-09-21 DIAGNOSIS — K029 Dental caries, unspecified: Secondary | ICD-10-CM

## 2015-09-21 MED ORDER — TRAMADOL HCL 50 MG PO TABS: 50.0000 mg | ORAL_TABLET | Freq: Two times a day (BID) | ORAL | Status: DC | PRN

## 2015-09-21 MED ORDER — IBUPROFEN 800 MG PO TABS: 800.0000 mg | ORAL_TABLET | Freq: Three times a day (TID) | ORAL | Status: DC | PRN

## 2015-09-21 MED ORDER — AMOXICILLIN 500 MG PO CAPS: 500.0000 mg | ORAL_CAPSULE | Freq: Two times a day (BID) | ORAL | Status: DC

## 2015-09-21 NOTE — Discharge Instructions (Signed)
Dental Pain Dental pain may be caused by many things, including:  Tooth decay (cavities or caries). Cavities expose the nerve of your tooth to air and hot or cold temperatures. This can cause pain or discomfort.  Abscess or infection. A dental abscess is a collection of infected pus from a bacterial infection in the inner part of the tooth (pulp). It usually occurs at the end of the tooth's root.  Injury.  An unknown reason (idiopathic). Your pain may be mild or severe. It may only occur when:  You are chewing.  You are exposed to hot or cold temperature.  You are eating or drinking sugary foods or beverages, such as soda or candy. Your pain may also be constant. HOME CARE INSTRUCTIONS Watch your dental pain for any changes. The following actions may help to lessen any discomfort that you are feeling:  Take medicines only as directed by your dentist.  If you were prescribed an antibiotic medicine, finish all of it even if you start to feel better.  Keep all follow-up visits as directed by your dentist. This is important.  Do not apply heat to the outside of your face.  Rinse your mouth or gargle with salt water if directed by your dentist. This helps with pain and swelling.  You can make salt water by adding  tsp of salt to 1 cup of warm water.  Apply ice to the painful area of your face:  Put ice in a plastic bag.  Place a towel between your skin and the bag.  Leave the ice on for 20 minutes, 2-3 times per day.  Avoid foods or drinks that cause you pain, such as:  Very hot or very cold foods or drinks.  Sweet or sugary foods or drinks. SEEK MEDICAL CARE IF:  Your pain is not controlled with medicines.  Your symptoms are worse.  You have new symptoms. SEEK IMMEDIATE MEDICAL CARE IF:  You are unable to open your mouth.  You are having trouble breathing or swallowing.  You have a fever.  Your face, neck, or jaw is swollen.   This information is not  intended to replace advice given to you by your health care provider. Make sure you discuss any questions you have with your health care provider.   No abscess is seen but covering you for infection. Use the Ibuprofen for mild pain and inflammation. Save the tramadol for night time or worsening pain. If you worsen with high fevers then go to the ER, otherwise please f/u with a Dentist.    Document Released: 07/12/2005 Document Revised: 11/26/2014 Document Reviewed: 07/08/2014 Elsevier Interactive Patient Education 2016 [employer].

## 2015-09-21 NOTE — ED Notes (Signed)
Pt here with left mouth swelling and dental pain that started 2 days ago Swelling started after food got caught in tooth, cracked tooth reported Denies drainage, fever,chills Requesting dentist referral

## 2015-09-21 NOTE — ED Provider Notes (Signed)
CSN: [id number]     Arrival date & time 09/21/15  1259 History   First MD Initiated Contact with Patient 09/21/15 1316     Chief Complaint  Patient presents with  . Dental Pain   (Consider location/radiation/quality/duration/timing/severity/associated sxs/prior Treatment) HPI Comments: Patient is a 25 yo black male who presents with left lower tooth pain. This began 2 days ago following a "crack" in his tooth after eating. He feels as though his gum is swollen with pain to eat and chew on that side. Pain kept him up last night. He denies fevers or chills. No ear pain. No URI symptoms.   Patient is a 25 y.o. male presenting with tooth pain. The history is provided by the patient.  Dental Pain Associated symptoms: no drooling and no fever     Past Medical History  Diagnosis Date  . Sickle cell trait (HCC)   . Eczema    History reviewed. No pertinent past surgical history. No family history on file. Social History  Substance Use Topics  . Smoking status: Current Every Day Smoker -- 0.10 packs/day    Types: Cigarettes  . Smokeless tobacco: None  . Alcohol Use: Yes    Review of Systems  Constitutional: Negative for fever and chills.  HENT: Negative.  Negative for drooling.   Skin: Negative.   Psychiatric/Behavioral: Negative.     Allergies  Review of patient's allergies indicates no known allergies.  Home Medications   Prior to Admission medications   Medication Sig Start Date End Date Taking? Authorizing Provider  amoxicillin (AMOXIL) 500 MG capsule Take 1 capsule (500 mg total) by mouth 2 (two) times daily. 09/21/15   Riki Sheer, PA-C  diclofenac (CATAFLAM) 50 MG tablet Take 1 tablet (50 mg total) by mouth 3 (three) times daily. One tablet TID with food prn pain. 08/16/15   Hayden Rasmussen, NP  ibuprofen (ADVIL,MOTRIN) 800 MG tablet Take 1 tablet (800 mg total) by mouth every 8 (eight) hours as needed for fever or moderate pain. 09/21/15   Riki Sheer, PA-C   indomethacin (INDOCIN) 25 MG capsule Take 2 capsules (50 mg total) by mouth 3 (three) times daily as needed. 07/21/15   Samantha Tripp Dowless, PA-C  naproxen (NAPROSYN) 250 MG tablet Take 1 tablet (250 mg total) by mouth 2 (two) times daily with a meal. 03/13/15   Everlene Farrier, PA-C  traMADol (ULTRAM) 50 MG tablet Take 1 tablet (50 mg total) by mouth every 12 (twelve) hours as needed for severe pain. 09/21/15   Riki Sheer, PA-C   Meds Ordered and Administered this Visit  Medications - No data to display  BP 130/76 mmHg  Pulse 78  Temp(Src) 97.8 F (36.6 C) (Oral)  Resp 16  SpO2 100% No data found.   Physical Exam  Constitutional: He is oriented to person, place, and time. He appears well-developed and well-nourished. No distress.  HENT:  Head: Normocephalic and atraumatic.  Mouth/Throat: Oropharynx is clear and moist. No oropharyngeal exudate.  Left lower 2nd molar with tenderness to palpation, grayish appearing without frank abscess or drainage  Neck:  Left tonsillar lymphadenopathy  Neurological: He is alert and oriented to person, place, and time.  Skin: Skin is warm and dry. He is not diaphoretic.  Psychiatric: His behavior is normal.  Nursing note and vitals reviewed.   ED Course  Procedures (including critical care time)  Labs Review Labs Reviewed - No data to display  Imaging Review No results found.   Visual  Acuity Review  Right Eye Distance:   Left Eye Distance:   Bilateral Distance:    Right Eye Near:   Left Eye Near:    Bilateral Near:         MDM   1. Pain due to dental caries    Treat prophylactically  with Amox, NSAIDs and prn tramadol if needed for severe pain. If worsens from a febrile or swelling standpoint f/u in the ER. Otherwise will need dental f/u.     Riki Sheer, PA-C 09/21/15 1343

## 2015-10-13 ENCOUNTER — Encounter (HOSPITAL_COMMUNITY): Payer: Self-pay | Admitting: Emergency Medicine

## 2015-10-13 ENCOUNTER — Emergency Department (HOSPITAL_COMMUNITY)
Admission: EM | Admit: 2015-10-13 | Discharge: 2015-10-13 | Disposition: A | Discharge status: Home / Self Care | Payer: No Typology Code available for payment source | Attending: Emergency Medicine | Admitting: Emergency Medicine

## 2015-10-13 ENCOUNTER — Emergency Department (HOSPITAL_COMMUNITY): Payer: No Typology Code available for payment source

## 2015-10-13 DIAGNOSIS — Z792 Long term (current) use of antibiotics: Secondary | ICD-10-CM | POA: Insufficient documentation

## 2015-10-13 DIAGNOSIS — M25561 Pain in right knee: Secondary | ICD-10-CM

## 2015-10-13 DIAGNOSIS — Z862 Personal history of diseases of the blood and blood-forming organs and certain disorders involving the immune mechanism: Secondary | ICD-10-CM | POA: Insufficient documentation

## 2015-10-13 DIAGNOSIS — Y9389 Activity, other specified: Secondary | ICD-10-CM | POA: Insufficient documentation

## 2015-10-13 DIAGNOSIS — Z791 Long term (current) use of non-steroidal anti-inflammatories (NSAID): Secondary | ICD-10-CM | POA: Insufficient documentation

## 2015-10-13 DIAGNOSIS — Y92410 Unspecified street and highway as the place of occurrence of the external cause: Secondary | ICD-10-CM | POA: Diagnosis not present

## 2015-10-13 DIAGNOSIS — Z872 Personal history of diseases of the skin and subcutaneous tissue: Secondary | ICD-10-CM | POA: Diagnosis not present

## 2015-10-13 DIAGNOSIS — S199XXA Unspecified injury of neck, initial encounter: Secondary | ICD-10-CM | POA: Diagnosis not present

## 2015-10-13 DIAGNOSIS — S8991XA Unspecified injury of right lower leg, initial encounter: Secondary | ICD-10-CM | POA: Diagnosis present

## 2015-10-13 DIAGNOSIS — F17210 Nicotine dependence, cigarettes, uncomplicated: Secondary | ICD-10-CM | POA: Insufficient documentation

## 2015-10-13 DIAGNOSIS — Y998 Other external cause status: Secondary | ICD-10-CM | POA: Diagnosis not present

## 2015-10-13 MED ORDER — IBUPROFEN 400 MG PO TABS
600.0000 mg | ORAL_TABLET | Freq: Once | ORAL | Status: AC
  Administered 2015-10-13: 600 mg via ORAL
  Filled 2015-10-13: qty 1
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
## 2015-10-13 NOTE — ED Notes (Signed)
Pt ambulates independently and with steady gait at time of discharge. Discharge instructions and follow up information reviewed with patient. No other questions or concerns voiced at this time.  

## 2016-01-02 DIAGNOSIS — S8001XA Contusion of right knee, initial encounter: Secondary | ICD-10-CM | POA: Diagnosis not present

## 2016-01-02 DIAGNOSIS — M7051 Other bursitis of knee, right knee: Secondary | ICD-10-CM | POA: Diagnosis not present

## 2016-01-30 DIAGNOSIS — S8001XA Contusion of right knee, initial encounter: Secondary | ICD-10-CM | POA: Diagnosis not present

## 2016-01-30 DIAGNOSIS — M7051 Other bursitis of knee, right knee: Secondary | ICD-10-CM | POA: Diagnosis not present

## 2016-03-12 DIAGNOSIS — M7051 Other bursitis of knee, right knee: Secondary | ICD-10-CM | POA: Diagnosis not present

## 2016-03-12 DIAGNOSIS — S8001XA Contusion of right knee, initial encounter: Secondary | ICD-10-CM | POA: Diagnosis not present

## 2016-03-12 DIAGNOSIS — M7651 Patellar tendinitis, right knee: Secondary | ICD-10-CM | POA: Diagnosis not present

## 2016-08-31 ENCOUNTER — Ambulatory Visit (HOSPITAL_COMMUNITY)
Admission: EM | Admit: 2016-08-31 | Discharge: 2016-08-31 | Disposition: A | Discharge status: Home / Self Care | Payer: 59 | Attending: Family Medicine | Admitting: Family Medicine

## 2016-08-31 DIAGNOSIS — R509 Fever, unspecified: Secondary | ICD-10-CM | POA: Diagnosis not present

## 2016-08-31 DIAGNOSIS — R6889 Other general symptoms and signs: Secondary | ICD-10-CM | POA: Diagnosis not present

## 2016-08-31 DIAGNOSIS — R5383 Other fatigue: Secondary | ICD-10-CM

## 2016-08-31 DIAGNOSIS — R05 Cough: Secondary | ICD-10-CM

## 2016-08-31 MED ORDER — OSELTAMIVIR PHOSPHATE 75 MG PO CAPS: 75.0000 mg | ORAL_CAPSULE | Freq: Two times a day (BID) | ORAL | 0 refills | Status: DC

## 2016-08-31 NOTE — ED Triage Notes (Signed)
C/o flu like sx  States has chills sweat, body ache, sore throat, cough, nausea, diarrhea  otc meds used as tx

## 2016-08-31 NOTE — ED Provider Notes (Signed)
CSN: [id number]     Arrival date & time 08/31/16  1240 History   First MD Initiated Contact with Patient 08/31/16 1323     Chief Complaint  Patient presents with  . flu like sx   (Consider location/radiation/quality/duration/timing/severity/associated sxs/prior Treatment) C/o flu sx's for a day.   The history is provided by the patient.  URI  Presenting symptoms: congestion, cough, fatigue and fever   Severity:  Moderate Onset quality:  Sudden Duration:  1 day Timing:  Constant Chronicity:  New Relieved by:  Nothing Worsened by:  Nothing Ineffective treatments:  None tried   Past Medical History:  Diagnosis Date  . Eczema   . Sickle cell trait (HCC)    No past surgical history on file. No family history on file. Social History  Substance Use Topics  . Smoking status: Current Some Day Smoker    Packs/day: 0.10    Types: Cigars  . Smokeless tobacco: Not on file  . Alcohol use Yes    Review of Systems  Constitutional: Positive for fatigue and fever.  HENT: Positive for congestion.   Eyes: Negative.   Respiratory: Positive for cough.   Cardiovascular: Negative.   Gastrointestinal: Negative.   Endocrine: Negative.   Genitourinary: Negative.   Musculoskeletal: Negative.   Allergic/Immunologic: Negative.   Neurological: Negative.   Hematological: Negative.   Psychiatric/Behavioral: Negative.     Allergies  Patient has no known allergies.  Home Medications   Prior to Admission medications   Medication Sig Start Date End Date Taking? Authorizing Provider  amoxicillin (AMOXIL) 500 MG capsule Take 1 capsule (500 mg total) by mouth 2 (two) times daily. 09/21/15   Riki SheerMichelle G Young, PA-C  diclofenac (CATAFLAM) 50 MG tablet Take 1 tablet (50 mg total) by mouth 3 (three) times daily. One tablet TID with food prn pain. 08/16/15   Hayden Rasmussenavid Mabe, NP  ibuprofen (ADVIL,MOTRIN) 800 MG tablet Take 1 tablet (800 mg total) by mouth every 8 (eight) hours as needed for fever or  moderate pain. 09/21/15   Riki SheerMichelle G Young, PA-C  indomethacin (INDOCIN) 25 MG capsule Take 2 capsules (50 mg total) by mouth 3 (three) times daily as needed. 07/21/15   Samantha Tripp Dowless, PA-C  naproxen (NAPROSYN) 250 MG tablet Take 1 tablet (250 mg total) by mouth 2 (two) times daily with a meal. 03/13/15   Everlene FarrierWilliam Dansie, PA-C  oseltamivir (TAMIFLU) 75 MG capsule Take 1 capsule (75 mg total) by mouth every 12 (twelve) hours. 08/31/16   Deatra CanterWilliam J Miyo Aina, FNP  traMADol (ULTRAM) 50 MG tablet Take 1 tablet (50 mg total) by mouth every 12 (twelve) hours as needed for severe pain. 09/21/15   Riki SheerMichelle G Young, PA-C   Meds Ordered and Administered this Visit  Medications - No data to display  BP 119/72 (BP Location: Left Arm)   Pulse 82   Temp 98 F (36.7 C) (Oral)   Resp 16   SpO2 100%  No data found.   Physical Exam  Constitutional: He appears well-developed and well-nourished.  HENT:  Head: Normocephalic and atraumatic.  Right Ear: External ear normal.  Left Ear: External ear normal.  Mouth/Throat: Oropharynx is clear and moist.  Eyes: Conjunctivae and EOM are normal. Pupils are equal, round, and reactive to light.  Neck: Normal range of motion. Neck supple.  Cardiovascular: Normal rate, regular rhythm and normal heart sounds.   Pulmonary/Chest: Effort normal and breath sounds normal.  Abdominal: Soft. Bowel sounds are normal.  Nursing note and vitals  reviewed.   Urgent Care Course     Procedures (including critical care time)  Labs Review Labs Reviewed - No data to display  Imaging Review No results found.   Visual Acuity Review  Right Eye Distance:   Left Eye Distance:   Bilateral Distance:    Right Eye Near:   Left Eye Near:    Bilateral Near:         MDM   1. Flu-like symptoms    Tamiflu   Push po fluids, rest, tylenol and motrin otc prn as directed for fever, arthralgias, and myalgias.  Follow up prn if sx's continue or persist.    Deatra Canter, FNP 08/31/16 1341

## 2016-10-21 ENCOUNTER — Encounter (HOSPITAL_COMMUNITY): Payer: Self-pay

## 2016-10-21 ENCOUNTER — Emergency Department (HOSPITAL_COMMUNITY): Payer: 59

## 2016-10-21 ENCOUNTER — Emergency Department (HOSPITAL_COMMUNITY)
Admission: EM | Admit: 2016-10-21 | Discharge: 2016-10-21 | Disposition: A | Discharge status: Home / Self Care | Payer: 59 | Attending: Emergency Medicine | Admitting: Emergency Medicine

## 2016-10-21 DIAGNOSIS — E860 Dehydration: Secondary | ICD-10-CM | POA: Insufficient documentation

## 2016-10-21 DIAGNOSIS — F17290 Nicotine dependence, other tobacco product, uncomplicated: Secondary | ICD-10-CM | POA: Insufficient documentation

## 2016-10-21 DIAGNOSIS — R0602 Shortness of breath: Secondary | ICD-10-CM | POA: Diagnosis not present

## 2016-10-21 NOTE — ED Notes (Signed)
Pt upset about wait time. Pt informed of wait time and how we are doing everything to get him seen as soon as possible. Pt undecided if he will stay. Pt encouraged to wait to see DR

## 2016-10-21 NOTE — ED Triage Notes (Signed)
Per Pt, Pt is coming from downstairs where he works at [employer]. Pt started to have some SOB and Dizziness suddenly and was helped to sit down. Denies any CP.

## 2016-10-21 NOTE — ED Notes (Signed)
Spoke with patient as he was upset about waiting an lack of [facility]. Stated he was hungry but when offered something to eat he refused. Gave cup of ice water. Apologized for delay, again.

## 2016-10-21 NOTE — ED Notes (Signed)
Gatorade given to pt per request of NP

## 2016-10-21 NOTE — Discharge Instructions (Signed)
Tonight you became overheated and slightly dehydrated  Your X Ray and labs are normal Stay hydrated

## 2016-10-21 NOTE — ED Provider Notes (Signed)
[facility]-EMERGENCY DEPT Provider Note   CSN: [id number] Arrival date & time: 10/21/16  1343     History   Chief Complaint Chief Complaint  Patient presents with  . Shortness of Breath    HPI Austin [name] is a 26 y.o. male.  Patient states he has Sickle cell trait and has had 1 "crisis" tonight at work became overheated and dizzy  Since has had multiple glass of water and feels back to normal       Past Medical History:  Diagnosis Date  . Eczema   . Sickle cell trait (HCC)     There are no active problems to display for this patient.   History reviewed. No pertinent surgical history.     Home Medications    Prior to Admission medications   Medication Sig Start Date End Date Taking? Authorizing Provider  amoxicillin (AMOXIL) 500 MG capsule Take 1 capsule (500 mg total) by mouth 2 (two) times daily. Patient not taking: Reported on 10/21/2016 09/21/15   Riki Sheer, PA-C  diclofenac (CATAFLAM) 50 MG tablet Take 1 tablet (50 mg total) by mouth 3 (three) times daily. One tablet TID with food prn pain. Patient not taking: Reported on 10/21/2016 08/16/15   Hayden Rasmussen, NP  ibuprofen (ADVIL,MOTRIN) 800 MG tablet Take 1 tablet (800 mg total) by mouth every 8 (eight) hours as needed for fever or moderate pain. Patient not taking: Reported on 10/21/2016 09/21/15   Riki Sheer, PA-C  indomethacin (INDOCIN) 25 MG capsule Take 2 capsules (50 mg total) by mouth 3 (three) times daily as needed. Patient not taking: Reported on 10/21/2016 07/21/15   Lester Kinsman Dowless, PA-C  naproxen (NAPROSYN) 250 MG tablet Take 1 tablet (250 mg total) by mouth 2 (two) times daily with a meal. Patient not taking: Reported on 10/21/2016 03/13/15   Everlene Farrier, PA-C  oseltamivir (TAMIFLU) 75 MG capsule Take 1 capsule (75 mg total) by mouth every 12 (twelve) hours. Patient not taking: Reported on 10/21/2016 08/31/16   Deatra Canter, FNP  traMADol (ULTRAM) 50 MG tablet Take 1 tablet (50 mg  total) by mouth every 12 (twelve) hours as needed for severe pain. Patient not taking: Reported on 10/21/2016 09/21/15   Riki Sheer, PA-C    Family History No family history on file.  Social History Social History  Substance Use Topics  . Smoking status: Current Some Day Smoker    Packs/day: 0.10    Types: Cigars  . Smokeless tobacco: Never Used  . Alcohol use Yes     Allergies   Patient has no known allergies.   Review of Systems Review of Systems  Constitutional: Negative for fever.  Respiratory: Positive for shortness of breath. Negative for cough and wheezing.   Gastrointestinal: Negative for abdominal pain.  Musculoskeletal: Positive for myalgias.  Neurological: Negative for headaches.  Psychiatric/Behavioral: The patient is nervous/anxious.   All other systems reviewed and are negative.    Physical Exam Updated Vital Signs BP 135/82   Pulse 82   Temp 97.5 F (36.4 C) (Oral)   Resp 16   Ht 5\' 7"  (1.702 m)   Wt 71.2 kg   SpO2 100%   BMI 24.59 kg/m   Physical Exam  Constitutional: He appears well-developed and well-nourished.  HENT:  Head: Normocephalic.  Eyes: Pupils are equal, round, and reactive to light.  Neck: Normal range of motion.  Cardiovascular: Normal rate.   Pulmonary/Chest: Effort normal.  Abdominal: Soft.  Musculoskeletal: Normal range  of motion.  Neurological: He is alert.  Skin: Skin is warm.  Psychiatric: He has a normal mood and affect.  Nursing note and vitals reviewed.    ED Treatments / Results  Labs (all labs ordered are listed, but only abnormal results are displayed) Labs Reviewed - No data to display  EKG  EKG Interpretation  Date/Time:  Thursday October 21 2016 13:55:59 EDT Ventricular Rate:  71 PR Interval:  122 QRS Duration: 90 QT Interval:  352 QTC Calculation: 382 R Axis:   86 Text Interpretation:  Normal sinus rhythm Normal ECG No significant change since last tracing Confirmed by FLOYD MD, [name]  [phone]) on 10/21/2016 8:21:49 PM       Radiology Dg Chest 2 View  Result Date: 10/21/2016 CLINICAL DATA:  Episode of shortness of breath and chills and 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 Current smoker. History of sickle cell trait. EXAM: CHEST  2 VIEW COMPARISON:  Chest x-ray of August 09, 2013 FINDINGS: The lungs are adequately inflated. There is no focal infiltrate. There is no pleural effusion. The heart and pulmonary vascularity are normal. The mediastinum is normal in width. The trachea is midline. The bony thorax is unremarkable. IMPRESSION: There is no active cardiopulmonary disease. Electronically Signed   By: David  SwazilandJordan M.D.   On: 10/21/2016 14:54    Procedures Procedures (including critical care time)  Medications Ordered in ED Medications - No data to display   Initial Impression / Assessment and Plan / ED Course  I have reviewed the triage vital signs and the nursing notes.  Pertinent labs & imaging results that were available during my care of the patient were reviewed by me and considered in my medical decision making (see chart for details).    Labs and x-ray checked, all within normal parameters.  Patient is back to his baseline.  He's been instructed to continue staying hydrated    Final Clinical Impressions(s) / ED Diagnoses   Final diagnoses:  Shortness of breath  Dehydration, mild    New Prescriptions New Prescriptions   No medications on file     Earley FavorGail Brendt Dible, NP 10/21/16 2114    Melene Planan Floyd, DO 10/22/16 [id number]

## 2016-11-24 ENCOUNTER — Emergency Department (HOSPITAL_COMMUNITY)
Admission: EM | Admit: 2016-11-24 | Discharge: 2016-11-24 | Disposition: A | Discharge status: Home / Self Care | Payer: 59 | Attending: Emergency Medicine | Admitting: Emergency Medicine

## 2016-11-24 ENCOUNTER — Encounter (HOSPITAL_COMMUNITY): Payer: Self-pay

## 2016-11-24 ENCOUNTER — Emergency Department (HOSPITAL_COMMUNITY): Payer: 59

## 2016-11-24 DIAGNOSIS — F17290 Nicotine dependence, other tobacco product, uncomplicated: Secondary | ICD-10-CM | POA: Diagnosis not present

## 2016-11-24 DIAGNOSIS — M545 Low back pain, unspecified: Secondary | ICD-10-CM

## 2016-11-24 DIAGNOSIS — R0789 Other chest pain: Secondary | ICD-10-CM | POA: Insufficient documentation

## 2016-11-24 DIAGNOSIS — Y990 Civilian activity done for income or pay: Secondary | ICD-10-CM | POA: Insufficient documentation

## 2016-11-24 DIAGNOSIS — Y929 Unspecified place or not applicable: Secondary | ICD-10-CM | POA: Diagnosis not present

## 2016-11-24 DIAGNOSIS — X500XXA Overexertion from strenuous movement or load, initial encounter: Secondary | ICD-10-CM | POA: Insufficient documentation

## 2016-11-24 DIAGNOSIS — M549 Dorsalgia, unspecified: Secondary | ICD-10-CM | POA: Diagnosis present

## 2016-11-24 DIAGNOSIS — Y939 Activity, unspecified: Secondary | ICD-10-CM | POA: Insufficient documentation

## 2016-11-24 LAB — COMPREHENSIVE METABOLIC PANEL
ALBUMIN: 4.9 g/dL (ref 3.5–5.0)
ALT: 29 U/L (ref 17–63)
ANION GAP: 8 (ref 5–15)
AST: 18 U/L (ref 15–41)
Alkaline Phosphatase: 51 U/L (ref 38–126)
BILIRUBIN TOTAL: 1.5 mg/dL — AB (ref 0.3–1.2)
BUN: 12 mg/dL (ref 6–20)
CO2: 27 mmol/L (ref 22–32)
Calcium: 9.7 mg/dL (ref 8.9–10.3)
Chloride: 103 mmol/L (ref 101–111)
Creatinine, Ser: 1.03 mg/dL (ref 0.61–1.24)
GFR calc Af Amer: >60 mL/min (ref >60)
GFR calc non Af Amer: >60 mL/min (ref >60)
GLUCOSE: 92 mg/dL (ref 65–99)
POTASSIUM: 3.6 mmol/L (ref 3.5–5.1)
SODIUM: 138 mmol/L (ref 135–145)
TOTAL PROTEIN: 8.1 g/dL (ref 6.5–8.1)

## 2016-11-24 LAB — URINALYSIS, ROUTINE W REFLEX MICROSCOPIC
BILIRUBIN URINE: NEGATIVE
GLUCOSE, UA: NEGATIVE mg/dL
Hgb urine dipstick: NEGATIVE
KETONES UR: NEGATIVE mg/dL
LEUKOCYTES UA: NEGATIVE
NITRITE: NEGATIVE
PH: 7 (ref 5.0–8.0)
PROTEIN: NEGATIVE mg/dL
Specific Gravity, Urine: 1.025 (ref 1.005–1.030)

## 2016-11-24 LAB — CBC WITH DIFFERENTIAL/PLATELET
BASOS ABS: 0 10*3/uL (ref 0.0–0.1)
Basophils Relative: 0 %
Eosinophils Absolute: 0.3 10*3/uL (ref 0.0–0.7)
Eosinophils Relative: 2 %
HEMATOCRIT: 39.6 % (ref 39.0–52.0)
Hemoglobin: 14.2 g/dL (ref 13.0–17.0)
LYMPHS PCT: 27 %
Lymphs Abs: 3.1 10*3/uL (ref 0.7–4.0)
MCH: 28.6 pg (ref 26.0–34.0)
MCHC: 35.9 g/dL (ref 30.0–36.0)
MCV: 79.8 fL (ref 78.0–100.0)
Monocytes Absolute: 1.2 10*3/uL — ABNORMAL HIGH (ref 0.1–1.0)
Monocytes Relative: 11 %
NEUTROS ABS: 6.8 10*3/uL (ref 1.7–7.7)
Neutrophils Relative %: 60 %
Platelets: 122 10*3/uL — ABNORMAL LOW (ref 150–400)
RBC: 4.96 MIL/uL (ref 4.22–5.81)
RDW: 14.8 % (ref 11.5–15.5)
WBC: 11.3 10*3/uL — ABNORMAL HIGH (ref 4.0–10.5)

## 2016-11-24 MED ORDER — KETOROLAC TROMETHAMINE 30 MG/ML IJ SOLN
30.0000 mg | Freq: Once | INTRAMUSCULAR | Status: AC
  Administered 2016-11-24: 30 mg via INTRAVENOUS
  Filled 2016-11-24: qty 1

## 2016-11-24 MED ORDER — SODIUM CHLORIDE 0.9 % IV BOLUS (SEPSIS)
500.0000 mL | Freq: Once | INTRAVENOUS | Status: AC
  Administered 2016-11-24: 500 mL via INTRAVENOUS

## 2016-11-24 MED ORDER — IBUPROFEN 800 MG PO TABS: 800.0000 mg | ORAL_TABLET | Freq: Three times a day (TID) | ORAL | 0 refills | Status: DC | PRN

## 2016-11-24 MED ORDER — METHOCARBAMOL 500 MG PO TABS: 500.0000 mg | ORAL_TABLET | Freq: Two times a day (BID) | ORAL | 0 refills | Status: DC

## 2016-11-24 NOTE — ED Triage Notes (Signed)
PT C/O MID TO LOWER BACK PAIN WHILE AT WORK TODAY. DENIES INJURY. PT STS HE HAS THE SICKLE CELL TRAIT, BUT THIS IS THE SECOND FLARE-UP OF BACK PAIN IN LESS THAN A MONTH.

## 2016-11-24 NOTE — ED Notes (Signed)
PT DISCHARGED. INSTRUCTIONS AND PRESCRIPTIONS GIVEN. AAOX4. PT IN NO APPARENT DISTRESS OR PAIN. THE OPPORTUNITY TO ASK QUESTIONS WAS PROVIDED. 

## 2016-11-24 NOTE — ED Provider Notes (Signed)
Emergency Department Provider Note   I have reviewed the triage vital signs and the nursing notes.   HISTORY  Chief Complaint Back Pain   HPI CHETAN MEHRING is a 26 y.o. male with PMH of SS trait and eczema in the emergency department for evaluation of back pain. Pain began while at work today. 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 Heavy lifting is not normally a part of his job. He describes an aching pain in his mid back that radiates to his bilateral ribs. No pleuritic or exertional quality to the pain. He is not appreciated or rash. No increased pain with movement. No numbness or tingling in his arms or legs. No difficulty walking. The patient has had some pain in the distant past that he attributed to sickle cell trait but nothing recently.    Past Medical History:  Diagnosis Date  . Eczema   . Sickle cell trait (HCC)     There are no active problems to display for this patient.   History reviewed. No pertinent surgical history.  Current Outpatient Rx  . Order #: [id number] Class: Print  . Order #: [id number] Class: Print  . Order #: [id number] Class: Print  . Order #: [id number] Class: Print  . Order #: [id number] Class: Print  . Order #: [id number] Class: Print  . Order #: [id number] Class: Normal  . Order #: [id number] Class: Normal    Allergies Patient has no known allergies.  History reviewed. No pertinent family history.  Social History Social History  Substance Use Topics  . Smoking status: Current Some Day Smoker    Packs/day: 0.10    Types: Cigars  . Smokeless tobacco: Never Used  . Alcohol use Yes    Review of Systems  Constitutional: No fever/chills Eyes: No visual changes. ENT: No sore throat. Cardiovascular: Denies chest pain. Respiratory: Denies shortness of breath. Gastrointestinal: No abdominal pain.  No nausea, no vomiting.  No diarrhea.  No constipation. Genitourinary: Negative for dysuria. Musculoskeletal: Positive for back  pain. Skin: Negative for rash. Neurological: Negative for headaches, focal weakness or numbness.  10-point ROS otherwise negative.  ____________________________________________   PHYSICAL EXAM:  VITAL SIGNS: ED Triage Vitals  Enc Vitals Group     BP 11/24/16 1624 134/71     Pulse Rate 11/24/16 1624 86     Resp 11/24/16 1624 18     Temp 11/24/16 1624 98.1 F (36.7 C)     Temp Source 11/24/16 1624 Oral     SpO2 11/24/16 1624 98 %     Weight 11/24/16 1650 164 lb (74.4 kg)     Height 11/24/16 1650  (1.702 m)     Pain Score 11/24/16 1650 8   Constitutional: Alert and oriented. Well appearing and in no acute distress. Eyes: Conjunctivae are normal.  Head: Atraumatic. Nose: No congestion/rhinnorhea. Mouth/Throat: Mucous membranes are moist.  Oropharynx non-erythematous. Neck: No stridor.  Cardiovascular: Normal rate, regular rhythm. Good peripheral circulation. Grossly normal heart sounds.   Respiratory: Normal respiratory effort.  No retractions. Lungs CTAB. Gastrointestinal: Soft and nontender. No distention.  Musculoskeletal: No lower extremity tenderness nor edema. No gross deformities of extremities. No midline thoracic or lumbar spine tenderness. Mild tenderness to palpation of the posterior chest wall.  Neurologic:  Normal speech and language. No gross focal neurologic deficits are appreciated.  Skin:  Skin is warm, dry and intact. No rash noted. Psychiatric: Mood and affect are normal. Speech and behavior are normal.  ____________________________________________   LABS (all labs ordered are  listed, but only abnormal results are displayed)  Labs Reviewed  COMPREHENSIVE METABOLIC PANEL - Abnormal; Notable for the following:       Result Value   Total Bilirubin 1.5 (*)    All other components within normal limits  CBC WITH DIFFERENTIAL/PLATELET - Abnormal; Notable for the following:    WBC 11.3 (*)    Platelets 122 (*)    Monocytes Absolute 1.2 (*)    All other  components within normal limits  URINALYSIS, ROUTINE W REFLEX MICROSCOPIC   ____________________________________________  RADIOLOGY  Dg Chest 2 View  Result Date: 11/24/2016 CLINICAL DATA:  Mid to lower back pain while at work. No known injury. History of sickle cell trait. EXAM: CHEST  2 VIEW COMPARISON:  10/21/2016 FINDINGS: The heart size and mediastinal contours are within normal limits. Both lungs are clear. The visualized skeletal structures are unremarkable. IMPRESSION: No active cardiopulmonary disease. Electronically Signed   By: Tollie Eth M.D.   On: 11/24/2016 17:22    ____________________________________________   PROCEDURES  Procedure(s) performed:   Procedures  None ____________________________________________   INITIAL IMPRESSION / ASSESSMENT AND PLAN / ED COURSE  Pertinent labs & imaging results that were available during my care of the patient were reviewed by me and considered in my medical decision making (see chart for details).  Patient resents to the emergency department for evaluation of mid back pain with radiation around the ribs. No midline spine tenderness. No red flag symptoms to suggest spinal cord emergency or require further imaging. Patient does have a history of sickle cell trait but pain from this seems less likely. Plan for chest x-ray to rule out lower lobe pneumonia. Suspect musculoskeletal etiology. Extremely low suspicion for ACS or pulmonary embolism in this case symptoms are very atypical in a low risk patient.   06:36 PM Patient feeling better after Toradol. A plain film of the chest is unremarkable. Labs and urine are normal. Plan for discharge with Robaxin and Motrin with plan for several days off work. Suspect MSK etiology.   At this time, I do not feel there is any life-threatening condition present. I have reviewed and discussed all results (EKG, imaging, lab, urine as appropriate), exam findings with patient. I have reviewed nursing  notes and appropriate previous records.  I feel the patient is safe to be discharged home without further emergent workup. Discussed usual and customary return precautions. Patient and family (if present) verbalize understanding and are comfortable with this plan.  Patient will follow-up with their primary care provider. If they do not have a primary care provider, information for follow-up has been provided to them. All questions have been answered.  ____________________________________________  FINAL CLINICAL IMPRESSION(S) / ED DIAGNOSES  Final diagnoses:  Acute bilateral low back pain without sciatica  Chest wall pain     MEDICATIONS GIVEN DURING THIS VISIT:  Medications  sodium chloride 0.9 % bolus 500 mL (0 mLs Intravenous Stopped 11/24/16 1917)  ketorolac (TORADOL) 30 MG/ML injection 30 mg (30 mg Intravenous Given 11/24/16 1740)     NEW OUTPATIENT MEDICATIONS STARTED DURING THIS VISIT:  Discharge Medication List as of 11/24/2016  6:38 PM    START taking these medications   Details  methocarbamol (ROBAXIN) 500 MG tablet Take 1 tablet (500 mg total) by mouth 2 (two) times daily., Starting Wed 11/24/2016, Print        Note:  This document was prepared using Dragon voice recognition software and may include unintentional dictation errors.  Alona Bene, MD  Emergency Medicine   Maia Plan, MD 11/25/16 1350

## 2016-11-24 NOTE — Discharge Instructions (Signed)

## 2017-05-25 ENCOUNTER — Emergency Department (HOSPITAL_COMMUNITY)
Admission: EM | Admit: 2017-05-25 | Discharge: 2017-05-25 | Disposition: A | Discharge status: Home / Self Care | Payer: 59 | Attending: Emergency Medicine | Admitting: Emergency Medicine

## 2017-05-25 ENCOUNTER — Encounter (HOSPITAL_COMMUNITY): Payer: Self-pay | Admitting: *Deleted

## 2017-05-25 DIAGNOSIS — R197 Diarrhea, unspecified: Secondary | ICD-10-CM | POA: Insufficient documentation

## 2017-05-25 DIAGNOSIS — R17 Unspecified jaundice: Secondary | ICD-10-CM | POA: Diagnosis not present

## 2017-05-25 DIAGNOSIS — R112 Nausea with vomiting, unspecified: Secondary | ICD-10-CM | POA: Insufficient documentation

## 2017-05-25 DIAGNOSIS — F17210 Nicotine dependence, cigarettes, uncomplicated: Secondary | ICD-10-CM | POA: Insufficient documentation

## 2017-05-25 DIAGNOSIS — Z79899 Other long term (current) drug therapy: Secondary | ICD-10-CM | POA: Diagnosis not present

## 2017-05-25 LAB — COMPREHENSIVE METABOLIC PANEL
ALBUMIN: 4.9 g/dL (ref 3.5–5.0)
ALT: 30 U/L (ref 17–63)
AST: 15 U/L (ref 15–41)
Alkaline Phosphatase: 58 U/L (ref 38–126)
Anion gap: 10 (ref 5–15)
BILIRUBIN TOTAL: 3.6 mg/dL — AB (ref 0.3–1.2)
BUN: 13 mg/dL (ref 6–20)
CO2: 27 mmol/L (ref 22–32)
Calcium: 9.4 mg/dL (ref 8.9–10.3)
Chloride: 98 mmol/L — ABNORMAL LOW (ref 101–111)
Creatinine, Ser: 0.99 mg/dL (ref 0.61–1.24)
GFR calc Af Amer: >60 mL/min (ref >60)
GFR calc non Af Amer: >60 mL/min (ref >60)
GLUCOSE: 100 mg/dL — AB (ref 65–99)
POTASSIUM: 3.6 mmol/L (ref 3.5–5.1)
Sodium: 135 mmol/L (ref 135–145)
TOTAL PROTEIN: 8.9 g/dL — AB (ref 6.5–8.1)

## 2017-05-25 LAB — URINALYSIS, ROUTINE W REFLEX MICROSCOPIC
Glucose, UA: NEGATIVE mg/dL
Hgb urine dipstick: NEGATIVE
Ketones, ur: NEGATIVE mg/dL
Leukocytes, UA: NEGATIVE
NITRITE: NEGATIVE
Protein, ur: 30 mg/dL — AB
SPECIFIC GRAVITY, URINE: 1.028 (ref 1.005–1.030)
pH: 5 (ref 5.0–8.0)

## 2017-05-25 LAB — CBC
HEMATOCRIT: 37.3 % — AB (ref 39.0–52.0)
Hemoglobin: 13.1 g/dL (ref 13.0–17.0)
MCH: 27.9 pg (ref 26.0–34.0)
MCHC: 35.1 g/dL (ref 30.0–36.0)
MCV: 79.5 fL (ref 78.0–100.0)
Platelets: 114 10*3/uL — ABNORMAL LOW (ref 150–400)
RBC: 4.69 MIL/uL (ref 4.22–5.81)
RDW: 15 % (ref 11.5–15.5)
WBC: 9.7 10*3/uL (ref 4.0–10.5)

## 2017-05-25 LAB — LIPASE, BLOOD: LIPASE: 21 U/L (ref 11–51)

## 2017-05-25 MED ORDER — ONDANSETRON HCL 4 MG PO TABS: 4.0000 mg | ORAL_TABLET | Freq: Three times a day (TID) | ORAL | 0 refills | Status: DC | PRN

## 2017-05-25 MED ORDER — SODIUM CHLORIDE 0.9 % IV BOLUS (SEPSIS)
1000.0000 mL | Freq: Once | INTRAVENOUS | Status: AC
  Administered 2017-05-25: 1000 mL via INTRAVENOUS

## 2017-05-25 MED ORDER — ONDANSETRON HCL 4 MG/2ML IJ SOLN
4.0000 mg | Freq: Once | INTRAMUSCULAR | Status: AC
  Administered 2017-05-25: 4 mg via INTRAVENOUS
  Filled 2017-05-25: qty 2

## 2017-05-25 MED ORDER — DICYCLOMINE HCL 20 MG PO TABS: 20.0000 mg | ORAL_TABLET | Freq: Two times a day (BID) | ORAL | 0 refills | Status: DC

## 2017-05-25 MED ORDER — FENTANYL CITRATE (PF) 100 MCG/2ML IJ SOLN
50.0000 ug | Freq: Once | INTRAMUSCULAR | Status: AC
  Administered 2017-05-25: 50 ug via INTRAVENOUS
  Filled 2017-05-25: qty 2

## 2017-05-25 MED ORDER — MELOXICAM 15 MG PO TABS: 15.0000 mg | ORAL_TABLET | Freq: Every day | ORAL | 0 refills | Status: DC

## 2017-05-25 MED ORDER — DICYCLOMINE HCL 10 MG PO CAPS
10.0000 mg | ORAL_CAPSULE | Freq: Once | ORAL | Status: AC
  Administered 2017-05-25: 10 mg via ORAL
  Filled 2017-05-25: qty 1

## 2017-05-25 MED ORDER — LOPERAMIDE HCL 2 MG PO CAPS
2.0000 mg | ORAL_CAPSULE | Freq: Once | ORAL | Status: AC
  Administered 2017-05-25: 2 mg via ORAL
  Filled 2017-05-25: qty 1

## 2017-05-25 MED ORDER — LOPERAMIDE HCL 2 MG PO CAPS: 2.0000 mg | ORAL_CAPSULE | Freq: Four times a day (QID) | ORAL | 0 refills | Status: DC | PRN

## 2017-05-25 NOTE — ED Triage Notes (Signed)
Pt complains of lower back pain, diarrhea, lower abdominal pain for the past 2 days.

## 2017-05-25 NOTE — Discharge Instructions (Signed)
Have your labs rechecked by a primary care doctor. 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 It is not an emergency but may need to be looked into by a primary care doctor. It is also likely why your urine is amber colored.  Abdominal (belly) pain can be caused by many things. Your caregiver performed an examination and possibly ordered blood/urine tests and imaging (CT scan, x-rays, ultrasound). Many cases can be observed and treated at home after initial evaluation in the emergency department. Even though you are being discharged home, abdominal pain can be unpredictable. Therefore, you need a repeated exam if your pain does not resolve, returns, or worsens. Most patients with abdominal pain don't have to be admitted to the hospital or have surgery, but serious problems like appendicitis and gallbladder attacks can start out as nonspecific pain. Many abdominal conditions cannot be diagnosed in one visit, so follow-up evaluations are very important. SEEK IMMEDIATE MEDICAL ATTENTION IF: The pain does not go away or becomes severe.  A temperature above 101 develops.  Repeated vomiting occurs (multiple episodes).  The pain becomes localized to portions of the abdomen. The right side could possibly be appendicitis. In an adult, the left lower portion of the abdomen could be colitis or diverticulitis.  Blood is being passed in stools or vomit (bright red or black tarry stools).  Return also if you develop chest pain, difficulty breathing, dizziness or fainting, or become confused, poorly responsive, or inconsolable (young children).

## 2017-05-25 NOTE — ED Notes (Signed)
ED Provider at bedside. 

## 2017-05-25 NOTE — ED Notes (Signed)
Patient requesting pain medication-PA made aware. 

## 2017-05-25 NOTE — ED Provider Notes (Signed)
[facility]-EMERGENCY DEPT Provider Note   CSN: [id number] Arrival date & time: 05/25/17  [id number]     History   Chief Complaint Chief Complaint  Patient presents with  . Back Pain  . Abdominal Pain  . Diarrhea    HPI Austin [name] is a 26 y.o. male who presents with cc of n/v/d.  The patient developed diarrhea 2 nights ago. Yesterday he began having pain in his abdomen and nausea with several episodes of NBNB vomitus. He describes his abdominal pain as constant, achy, and in the lower part of his abdomen.  He had associated anorexia. He had a fever up to 101.61F yesterday with chills and myalgias. He took tylenol with resolution of his fever. He also developed R flank pain this morning with Sharp, intermittent and severe LBP. He has been able to keep down sips of fluid. He noticed today that his urine was dark, but denies other urinary sxs, hx of kidney stones, or UTI. He denies contacts with similar sxs, ingestion of suspect foods or water, history of similar sxs, recent foreign travel .   HPI  Past Medical History:  Diagnosis Date  . Eczema   . Sickle cell trait (HCC)     There are no active problems to display for this patient.   History reviewed. No pertinent surgical history.     Home Medications    Prior to Admission medications   Medication Sig Start Date End Date Taking? Authorizing Provider  Tetrahydrozoline HCl (VISINE OP) Place 1-2 drops into both eyes daily as needed (red eye).   Yes [provider]  amoxicillin (AMOXIL) 500 MG capsule Take 1 capsule (500 mg total) by mouth 2 (two) times daily. Patient not taking: Reported on 10/21/2016 09/21/15   Riki SheerYoung, Michelle G, PA-C  diclofenac (CATAFLAM) 50 MG tablet Take 1 tablet (50 mg total) by mouth 3 (three) times daily. One tablet TID with food prn pain. Patient not taking: Reported on 10/21/2016 08/16/15   Hayden RasmussenMabe, David, NP  dicyclomine (BENTYL) 20 MG tablet Take 1 tablet (20 mg total)  by mouth 2 (two) times daily. 05/25/17   Everett Ehrler, Cammy CopaAbigail, PA-C  ibuprofen (ADVIL,MOTRIN) 800 MG tablet Take 1 tablet (800 mg total) by mouth every 8 (eight) hours as needed. Patient not taking: Reported on 05/25/2017 11/24/16   Long, Arlyss RepressJoshua G, MD  indomethacin (INDOCIN) 25 MG capsule Take 2 capsules (50 mg total) by mouth 3 (three) times daily as needed. Patient not taking: Reported on 10/21/2016 07/21/15   Dowless, Lelon MastSamantha Tripp, PA-C  loperamide (IMODIUM) 2 MG capsule Take 1 capsule (2 mg total) by mouth 4 (four) times daily as needed for diarrhea or loose stools. 05/25/17   Arthor CaptainHarris, Venessa Wickham, PA-C  meloxicam (MOBIC) 15 MG tablet Take 1 tablet (15 mg total) by mouth daily. Take 1 daily with food. 05/25/17   Mickle Campton, Cammy CopaAbigail, PA-C  methocarbamol (ROBAXIN) 500 MG tablet Take 1 tablet (500 mg total) by mouth 2 (two) times daily. Patient not taking: Reported on 05/25/2017 11/24/16   Long, Arlyss RepressJoshua G, MD  naproxen (NAPROSYN) 250 MG tablet Take 1 tablet (250 mg total) by mouth 2 (two) times daily with a meal. Patient not taking: Reported on 10/21/2016 03/13/15   Everlene Farrieransie, William, PA-C  ondansetron (ZOFRAN) 4 MG tablet Take 1 tablet (4 mg total) by mouth every 8 (eight) hours as needed for nausea or vomiting. 05/25/17   Arthor CaptainHarris, Lusia Greis, PA-C  oseltamivir (TAMIFLU) 75 MG capsule Take 1 capsule (75 mg total)  by mouth every 12 (twelve) hours. Patient not taking: Reported on 10/21/2016 08/31/16   Deatra Canter, FNP  traMADol (ULTRAM) 50 MG tablet Take 1 tablet (50 mg total) by mouth every 12 (twelve) hours as needed for severe pain. Patient not taking: Reported on 10/21/2016 09/21/15   Riki Sheer, PA-C    Family History No family history on file.  Social History Social History  Substance Use Topics  . Smoking status: Current Some Day Smoker    Packs/day: 0.10    Types: Cigars  . Smokeless tobacco: Never Used  . Alcohol use Yes     Allergies   Patient has no known allergies.   Review of  Systems Review of Systems  Ten systems reviewed and are negative for acute change, except as noted in the HPI.   Physical Exam Updated Vital Signs BP 110/71   Pulse 86   Temp 98.6 F (37 C) (Oral)   Resp 16   SpO2 100%   Physical Exam  Constitutional: He is oriented to person, place, and time. He appears well-developed and well-nourished. No distress.  HENT:  Head: Normocephalic and atraumatic.  Eyes: Pupils are equal, round, and reactive to light. Conjunctivae and EOM are normal. Scleral icterus (mildly icteric) is present.  Neck: Normal range of motion. Neck supple.  Cardiovascular: Normal rate, regular rhythm and normal heart sounds.   Pulmonary/Chest: Effort normal and breath sounds normal. No respiratory distress. He has no wheezes.  Abdominal: Soft. Bowel sounds are normal. He exhibits no distension. There is tenderness in the left upper quadrant. There is no CVA tenderness.    Genitourinary:  Genitourinary Comments: Red/Brown urine sample at bedside  Musculoskeletal: He exhibits no edema.       Arms: Neurological: He is alert and oriented to person, place, and time.  Skin: Skin is warm and dry. He is not diaphoretic.  Psychiatric: His behavior is normal.  Nursing note and vitals reviewed.    ED Treatments / Results  Labs (all labs ordered are listed, but only abnormal results are displayed) Labs Reviewed  COMPREHENSIVE METABOLIC PANEL - Abnormal; Notable for the following:       Result Value   Chloride 98 (*)    Glucose, Bld 100 (*)    Total Protein 8.9 (*)    Total Bilirubin 3.6 (*)    All other components within normal limits  CBC - Abnormal; Notable for the following:    HCT 37.3 (*)    Platelets 114 (*)    All other components within normal limits  URINALYSIS, ROUTINE W REFLEX MICROSCOPIC - Abnormal; Notable for the following:    Color, Urine AMBER (*)    Bilirubin Urine SMALL (*)    Protein, ur 30 (*)    Bacteria, UA MANY (*)    Squamous Epithelial  / LPF 0-5 (*)    All other components within normal limits  URINE CULTURE  LIPASE, BLOOD    EKG  EKG Interpretation None       Radiology No results found.  Procedures Procedures (including critical care time)  Medications Ordered in ED Medications  sodium chloride 0.9 % bolus 1,000 mL (0 mLs Intravenous Stopped 05/25/17 1531)  fentaNYL (SUBLIMAZE) injection 50 mcg (50 mcg Intravenous Given 05/25/17 1609)  ondansetron (ZOFRAN) injection 4 mg (4 mg Intravenous Given 05/25/17 1609)  loperamide (IMODIUM) capsule 2 mg (2 mg Oral Given 05/25/17 1808)  dicyclomine (BENTYL) capsule 10 mg (10 mg Oral Given 05/25/17 1810)  Initial Impression / Assessment and Plan / ED Course  I have reviewed the triage vital signs and the nursing notes.  Pertinent labs & imaging results that were available during my care of the patient were reviewed by me and considered in my medical decision making (see chart for details).  Clinical Course as of May 25 2122  Wed May 25, 2017  1413 Patient with N/V/D, and fever. Given his urine color and mild icterus I have some concern for acute hepatitis. DDX also includes kidney stone, pyelonephritis. Will treat sxs and await labs.  [AH]  1630 Urine appears contaminated  [AH]  1630 Patient feels improved   [AH]  1631 GFR, Est Non African American: >60 [AH]  1631 Bilirubin appears chronically elevated. Total Bilirubin: (!) 3.6 [AH]    Clinical Course User Index [AH] Arthor Captain, PA-C    Patient with symptoms likely due to gastroenteritis.  Vitals are stable, no fever here.  He feels improved with fluids. No active vomiting.  No signs of dehydration, tolerating PO fluids > 6 oz.  Lungs are clear.  No focal abdominal pain, no concern for appendicitis, cholecystitis, pancreatitis, ruptured viscus, UTI, kidney stone, or any other abdominal etiology.  Supportive therapy indicated with return if symptoms worsen.  Patient counseled. He will need follow up on  his chronically elevated bilirubin.   Final Clinical Impressions(s) / ED Diagnoses   Final diagnoses:  Nausea vomiting and diarrhea  Bilirubinemia    New Prescriptions Discharge Medication List as of 05/25/2017  4:41 PM    START taking these medications   Details  dicyclomine (BENTYL) 20 MG tablet Take 1 tablet (20 mg total) by mouth 2 (two) times daily., Starting Wed 05/25/2017, Print    loperamide (IMODIUM) 2 MG capsule Take 1 capsule (2 mg total) by mouth 4 (four) times daily as needed for diarrhea or loose stools., Starting Wed 05/25/2017, Print    meloxicam (MOBIC) 15 MG tablet Take 1 tablet (15 mg total) by mouth daily. Take 1 daily with food., Starting Wed 05/25/2017, Print    ondansetron (ZOFRAN) 4 MG tablet Take 1 tablet (4 mg total) by mouth every 8 (eight) hours as needed for nausea or vomiting., Starting Wed 05/25/2017, Print         Austin Pea Dresden, PA-C 05/25/17 2125    Alvira Monday, MD 05/26/17 1302

## 2017-05-25 NOTE — ED Notes (Signed)
Patient given water and sandwich. 

## 2017-05-26 LAB — URINE CULTURE: Culture: NO GROWTH

## 2017-07-16 ENCOUNTER — Emergency Department (HOSPITAL_COMMUNITY): Payer: 59

## 2017-07-16 ENCOUNTER — Encounter (HOSPITAL_COMMUNITY): Payer: Self-pay | Admitting: Emergency Medicine

## 2017-07-16 ENCOUNTER — Other Ambulatory Visit: Payer: Self-pay

## 2017-07-16 ENCOUNTER — Emergency Department (HOSPITAL_COMMUNITY)
Admission: EM | Admit: 2017-07-16 | Discharge: 2017-07-16 | Disposition: A | Discharge status: Home / Self Care | Payer: 59 | Attending: Emergency Medicine | Admitting: Emergency Medicine

## 2017-07-16 DIAGNOSIS — D696 Thrombocytopenia, unspecified: Secondary | ICD-10-CM

## 2017-07-16 DIAGNOSIS — R161 Splenomegaly, not elsewhere classified: Secondary | ICD-10-CM | POA: Diagnosis not present

## 2017-07-16 DIAGNOSIS — Z79899 Other long term (current) drug therapy: Secondary | ICD-10-CM | POA: Diagnosis not present

## 2017-07-16 DIAGNOSIS — R109 Unspecified abdominal pain: Secondary | ICD-10-CM | POA: Diagnosis not present

## 2017-07-16 DIAGNOSIS — R1084 Generalized abdominal pain: Secondary | ICD-10-CM | POA: Diagnosis not present

## 2017-07-16 DIAGNOSIS — F17290 Nicotine dependence, other tobacco product, uncomplicated: Secondary | ICD-10-CM | POA: Insufficient documentation

## 2017-07-16 LAB — URINALYSIS, ROUTINE W REFLEX MICROSCOPIC
Bilirubin Urine: NEGATIVE
GLUCOSE, UA: NEGATIVE mg/dL
HGB URINE DIPSTICK: NEGATIVE
Ketones, ur: NEGATIVE mg/dL
Leukocytes, UA: NEGATIVE
NITRITE: NEGATIVE
Protein, ur: NEGATIVE mg/dL
Specific Gravity, Urine: 1.02 (ref 1.005–1.030)
pH: 6 (ref 5.0–8.0)

## 2017-07-16 LAB — CBC
HEMATOCRIT: 40.1 % (ref 39.0–52.0)
HEMOGLOBIN: 14 g/dL (ref 13.0–17.0)
MCH: 27.8 pg (ref 26.0–34.0)
MCHC: 34.9 g/dL (ref 30.0–36.0)
MCV: 79.6 fL (ref 78.0–100.0)
Platelets: 135 10*3/uL — ABNORMAL LOW (ref 150–400)
RBC: 5.04 MIL/uL (ref 4.22–5.81)
RDW: 16.1 % — ABNORMAL HIGH (ref 11.5–15.5)
WBC: 11.6 10*3/uL — ABNORMAL HIGH (ref 4.0–10.5)

## 2017-07-16 LAB — BASIC METABOLIC PANEL
Anion gap: 9 (ref 5–15)
BUN: 9 mg/dL (ref 6–20)
CHLORIDE: 100 mmol/L — AB (ref 101–111)
CO2: 25 mmol/L (ref 22–32)
CREATININE: 0.89 mg/dL (ref 0.61–1.24)
Calcium: 9.4 mg/dL (ref 8.9–10.3)
GFR calc non Af Amer: >60 mL/min (ref >60)
GLUCOSE: 87 mg/dL (ref 65–99)
Potassium: 3.9 mmol/L (ref 3.5–5.1)
Sodium: 134 mmol/L — ABNORMAL LOW (ref 135–145)

## 2017-07-16 LAB — HEPATIC FUNCTION PANEL
ALBUMIN: 4.1 g/dL (ref 3.5–5.0)
ALK PHOS: 50 U/L (ref 38–126)
ALT: 20 U/L (ref 17–63)
AST: 10 U/L — ABNORMAL LOW (ref 15–41)
BILIRUBIN INDIRECT: 2 mg/dL — AB (ref 0.3–0.9)
BILIRUBIN TOTAL: 2.5 mg/dL — AB (ref 0.3–1.2)
Bilirubin, Direct: 0.5 mg/dL (ref 0.1–0.5)
Total Protein: 8 g/dL (ref 6.5–8.1)

## 2017-07-16 LAB — MONONUCLEOSIS SCREEN: Mono Screen: NEGATIVE

## 2017-07-16 MED ORDER — MORPHINE SULFATE (PF) 4 MG/ML IV SOLN
4.0000 mg | Freq: Once | INTRAVENOUS | Status: AC
Start: 2017-07-16 — End: 2017-07-16
  Administered 2017-07-16: 4 mg via INTRAVENOUS
  Filled 2017-07-16: qty 1

## 2017-07-16 MED ORDER — ONDANSETRON 4 MG PO TBDP: 4.0000 mg | ORAL_TABLET | Freq: Three times a day (TID) | ORAL | 0 refills | Status: DC | PRN

## 2017-07-16 MED ORDER — SODIUM CHLORIDE 0.9 % IV BOLUS (SEPSIS)
1000.0000 mL | Freq: Once | INTRAVENOUS | Status: AC
  Administered 2017-07-16: 1000 mL via INTRAVENOUS

## 2017-07-16 MED ORDER — MORPHINE SULFATE (PF) 4 MG/ML IV SOLN
4.0000 mg | Freq: Once | INTRAVENOUS | Status: AC
  Administered 2017-07-16: 4 mg via INTRAVENOUS
  Filled 2017-07-16: qty 1

## 2017-07-16 MED ORDER — ONDANSETRON HCL 4 MG/2ML IJ SOLN
4.0000 mg | Freq: Once | INTRAMUSCULAR | Status: AC
  Administered 2017-07-16: 4 mg via INTRAVENOUS
  Filled 2017-07-16: qty 2

## 2017-07-16 MED ORDER — HYDROCODONE-ACETAMINOPHEN 5-325 MG PO TABS: 1.0000 | ORAL_TABLET | ORAL | 0 refills | Status: DC | PRN

## 2017-07-16 NOTE — ED Notes (Signed)
Called lab to follow up on mono test for Dr. Particia NearingHaviland.  States tech is putting results in computer at this time

## 2017-07-16 NOTE — ED Triage Notes (Signed)
Pt. Stated, I started having back pain on the left and today I had some blood in my urine. Started yesterday

## 2017-07-16 NOTE — Discharge Instructions (Addendum)
Avoid contact sports for 2 weeks.

## 2017-07-16 NOTE — ED Provider Notes (Signed)
[facility] Provider Note   CSN: [id number] Arrival date & time: 07/16/17  1000     History   Chief Complaint Chief Complaint  Patient presents with  . Flank Pain  . Hematuria    HPI Austin [name] is a 26 y.o. male.  Pt presents to the ED today with left flank pain and some blood in urine.  He has never had a kidney stone, but family members have.  Pt also feels nauseous.  No f/c.      Past Medical History:  Diagnosis Date  . Eczema   . Sickle cell trait (HCC)     There are no active problems to display for this patient.   History reviewed. No pertinent surgical history.     Home Medications    Prior to Admission medications   Medication Sig Start Date End Date Taking? Authorizing Provider  amoxicillin (AMOXIL) 500 MG capsule Take 1 capsule (500 mg total) by mouth 2 (two) times daily. Patient not taking: Reported on 10/21/2016 09/21/15   Riki SheerYoung, Michelle G, PA-C  diclofenac (CATAFLAM) 50 MG tablet Take 1 tablet (50 mg total) by mouth 3 (three) times daily. One tablet TID with food prn pain. Patient not taking: Reported on 10/21/2016 08/16/15   Hayden RasmussenMabe, David, NP  dicyclomine (BENTYL) 20 MG tablet Take 1 tablet (20 mg total) by mouth 2 (two) times daily. 05/25/17   Arthor CaptainHarris, Abigail, PA-C  HYDROcodone-acetaminophen (NORCO/VICODIN) 5-325 MG tablet Take 1 tablet by mouth every 4 (four) hours as needed. 07/16/17   Jacalyn LefevreHaviland, Sathvika Ojo, MD  ibuprofen (ADVIL,MOTRIN) 800 MG tablet Take 1 tablet (800 mg total) by mouth every 8 (eight) hours as needed. Patient not taking: Reported on 05/25/2017 11/24/16   Long, Arlyss RepressJoshua G, MD  indomethacin (INDOCIN) 25 MG capsule Take 2 capsules (50 mg total) by mouth 3 (three) times daily as needed. Patient not taking: Reported on 10/21/2016 07/21/15   Dowless, Lelon MastSamantha Tripp, PA-C  loperamide (IMODIUM) 2 MG capsule Take 1 capsule (2 mg total) by mouth 4 (four) times daily as needed for diarrhea or loose stools.  05/25/17   Arthor CaptainHarris, Abigail, PA-C  meloxicam (MOBIC) 15 MG tablet Take 1 tablet (15 mg total) by mouth daily. Take 1 daily with food. 05/25/17   Harris, Cammy CopaAbigail, PA-C  methocarbamol (ROBAXIN) 500 MG tablet Take 1 tablet (500 mg total) by mouth 2 (two) times daily. Patient not taking: Reported on 05/25/2017 11/24/16   Long, Arlyss RepressJoshua G, MD  naproxen (NAPROSYN) 250 MG tablet Take 1 tablet (250 mg total) by mouth 2 (two) times daily with a meal. Patient not taking: Reported on 10/21/2016 03/13/15   Everlene Farrieransie, William, PA-C  ondansetron (ZOFRAN ODT) 4 MG disintegrating tablet Take 1 tablet (4 mg total) by mouth every 8 (eight) hours as needed. 07/16/17   Jacalyn LefevreHaviland, Ondria Oswald, MD  ondansetron (ZOFRAN) 4 MG tablet Take 1 tablet (4 mg total) by mouth every 8 (eight) hours as needed for nausea or vomiting. 05/25/17   Arthor CaptainHarris, Abigail, PA-C  oseltamivir (TAMIFLU) 75 MG capsule Take 1 capsule (75 mg total) by mouth every 12 (twelve) hours. Patient not taking: Reported on 10/21/2016 08/31/16   Deatra Canterxford, William J, FNP  Tetrahydrozoline HCl (VISINE OP) Place 1-2 drops into both eyes daily as needed (red eye).    [provider]  traMADol (ULTRAM) 50 MG tablet Take 1 tablet (50 mg total) by mouth every 12 (twelve) hours as needed for severe pain. Patient not taking: Reported on 10/21/2016 09/21/15  Riki SheerYoung, Michelle G, PA-C    Family History No family history on file.  Social History Social History   Tobacco Use  . Smoking status: Current Some Day Smoker    Packs/day: 0.10    Types: Cigars  . Smokeless tobacco: Never Used  Substance Use Topics  . Alcohol use: Yes  . Drug use: Yes    Types: Marijuana     Allergies   Patient has no known allergies.   Review of Systems Review of Systems  Gastrointestinal: Positive for nausea.  Genitourinary: Positive for flank pain and hematuria.  All other systems reviewed and are negative.    Physical Exam Updated Vital Signs BP 118/85 (BP Location: Right Arm)    Pulse 83   Temp 98.8 F (37.1 C) (Oral)   Resp 17   Ht 5\' 7"  (1.702 m)   Wt 79.4 kg (175 lb)   SpO2 99%   BMI 27.41 kg/m   Physical Exam  Constitutional: He is oriented to person, place, and time. He appears well-developed. He appears distressed.  HENT:  Head: Normocephalic and atraumatic.  Right Ear: External ear normal.  Left Ear: External ear normal.  Nose: Nose normal.  Mouth/Throat: Oropharynx is clear and moist.  Eyes: Conjunctivae and EOM are normal. Pupils are equal, round, and reactive to light.  Neck: Normal range of motion. Neck supple.  Cardiovascular: Normal rate, regular rhythm, normal heart sounds and intact distal pulses.  Pulmonary/Chest: Effort normal and breath sounds normal.  Abdominal: Soft. Bowel sounds are normal.  Musculoskeletal: Normal range of motion.  Neurological: He is alert and oriented to person, place, and time.  Skin: Skin is warm. Capillary refill takes less than 2 seconds.  Psychiatric: He has a normal mood and affect. His behavior is normal. Judgment and thought content normal.  Nursing note and vitals reviewed.    ED Treatments / Results  Labs (all labs ordered are listed, but only abnormal results are displayed) Labs Reviewed  CBC - Abnormal; Notable for the following components:      Result Value   WBC 11.6 (*)    RDW 16.1 (*)    Platelets 135 (*)    All other components within normal limits  BASIC METABOLIC PANEL - Abnormal; Notable for the following components:   Sodium 134 (*)    Chloride 100 (*)    All other components within normal limits  URINALYSIS, ROUTINE W REFLEX MICROSCOPIC  MONONUCLEOSIS SCREEN  HEPATIC FUNCTION PANEL    EKG  EKG Interpretation None       Radiology Ct Renal Stone Study  Result Date: 07/16/2017 CLINICAL DATA:  26 year old male with left-sided back pain and hematuria. Personal history of kidney stones and sickle cell trait. EXAM: CT ABDOMEN AND PELVIS WITHOUT CONTRAST TECHNIQUE:  Multidetector CT imaging of the abdomen and pelvis was performed following the standard protocol without IV contrast. COMPARISON:  None. FINDINGS: Lower chest: Dependent atelectasis in the lower lobes. The lung bases are otherwise clear. Visualized cardiac structures are within normal limits for size. No pericardial effusion. Unremarkable visualized distal thoracic esophagus. Hepatobiliary: Normal hepatic contour and morphology. No discrete hepatic lesions. Normal appearance of the gallbladder. No intra or extrahepatic biliary ductal dilatation. Pancreas: Unremarkable. No pancreatic ductal dilatation or surrounding inflammatory changes. Spleen: Splenomegaly. The spleen measures approximately 18.1 x 8.1 x 11.6 cm (volume = 890 cm^3). Adrenals/Urinary Tract: Adrenal glands are unremarkable. Kidneys are normal, without renal calculi, focal lesion, or hydronephrosis. Bladder is unremarkable. Stomach/Bowel: No evidence of obstruction  or focal bowel wall thickening. Normal appendix in the right lower quadrant. The terminal ileum is unremarkable. Vascular/Lymphatic: Limited evaluation in the absence of intravenous contrast. No atherosclerotic vascular calcifications, aneurysm or suspicious lymphadenopathy. Reproductive: Prostate is unremarkable. Other: No abdominal wall hernia or abnormality. No abdominopelvic ascites. Musculoskeletal: No acute or significant osseous findings. IMPRESSION: 1. Marked splenomegaly. The splenic volume is approximately 890 cubic cm which is more than double the upper limits of normal. This may represent the source of the patient's left flank pain. Differential considerations are broad and include splenic sequestration secondary to the patient's underlying sickle cell trait and recent viral disease such as mononucleosis. Lymphoma is considered less likely given the absence of lymphadenopathy. 2. No evidence of nephrolithiasis or hydronephrosis. Electronically Signed   By: Malachy Moan M.D.    On: 07/16/2017 13:42    Procedures Procedures (including critical care time)  Medications Ordered in ED Medications  sodium chloride 0.9 % bolus 1,000 mL (0 mLs Intravenous Stopped 07/16/17 1431)  morphine 4 MG/ML injection 4 mg (4 mg Intravenous Given 07/16/17 1235)  ondansetron (ZOFRAN) injection 4 mg (4 mg Intravenous Given 07/16/17 1235)  morphine 4 MG/ML injection 4 mg (4 mg Intravenous Given 07/16/17 1427)     Initial Impression / Assessment and Plan / ED Course  I have reviewed the triage vital signs and the nursing notes.  Pertinent labs & imaging results that were available during my care of the patient were reviewed by me and considered in my medical decision making (see chart for details).    Looking back at pt's chart, he has had low platelets and elevated bilirubin for years.  He thinks he has had an enlarged spleen since he was a child.  He remembers his grandmother not allowing him to play football.  He will be instructed to f/u with hematology.  He is to avoid all contact sports.  Return if worse.   Final Clinical Impressions(s) / ED Diagnoses   Final diagnoses:  Splenomegaly  Thrombocytopenia [facility])    ED Discharge Orders        Ordered    HYDROcodone-acetaminophen (NORCO/VICODIN) 5-325 MG tablet  Every 4 hours PRN     07/16/17 1611    ondansetron (ZOFRAN ODT) 4 MG disintegrating tablet  Every 8 hours PRN     07/16/17 1611       Jacalyn Lefevre, MD 07/16/17 1615

## 2017-08-12 ENCOUNTER — Encounter: Payer: Self-pay | Admitting: Family Medicine

## 2017-08-12 ENCOUNTER — Ambulatory Visit (INDEPENDENT_AMBULATORY_CARE_PROVIDER_SITE_OTHER): Payer: 59 | Admitting: Family Medicine

## 2017-08-12 VITALS — BP 134/88 | HR 77 | Temp 97.6°F | Resp 16 | Ht 67.0 in | Wt 173.0 lb

## 2017-08-12 DIAGNOSIS — Z131 Encounter for screening for diabetes mellitus: Secondary | ICD-10-CM | POA: Diagnosis not present

## 2017-08-12 DIAGNOSIS — Z1329 Encounter for screening for other suspected endocrine disorder: Secondary | ICD-10-CM

## 2017-08-12 DIAGNOSIS — D582 Other hemoglobinopathies: Secondary | ICD-10-CM | POA: Diagnosis not present

## 2017-08-12 DIAGNOSIS — R109 Unspecified abdominal pain: Secondary | ICD-10-CM

## 2017-08-12 DIAGNOSIS — D696 Thrombocytopenia, unspecified: Secondary | ICD-10-CM

## 2017-08-12 DIAGNOSIS — D573 Sickle-cell trait: Secondary | ICD-10-CM | POA: Diagnosis not present

## 2017-08-12 DIAGNOSIS — R161 Splenomegaly, not elsewhere classified: Secondary | ICD-10-CM

## 2017-08-12 LAB — POCT URINALYSIS DIP (DEVICE)
GLUCOSE, UA: NEGATIVE mg/dL
Hgb urine dipstick: NEGATIVE
Ketones, ur: NEGATIVE mg/dL
LEUKOCYTES UA: NEGATIVE
NITRITE: NEGATIVE
Protein, ur: NEGATIVE mg/dL
Specific Gravity, Urine: >=1.03 (ref 1.005–1.030)
Urobilinogen, UA: 1 mg/dL (ref 0.0–1.0)
pH: 5.5 (ref 5.0–8.0)

## 2017-08-12 LAB — POCT GLYCOSYLATED HEMOGLOBIN (HGB A1C): HEMOGLOBIN A1C: 4.2

## 2017-08-12 MED ORDER — ACETAMINOPHEN-CODEINE #3 300-30 MG PO TABS: 1.0000 | ORAL_TABLET | Freq: Four times a day (QID) | ORAL | 0 refills | Status: DC | PRN

## 2017-08-12 NOTE — Patient Instructions (Signed)
For your recent ongoing pain, I will temporarily prescribe you Tylenol 3 pain medication. This will not be something that can be prescribed on an ongoing basis.   Hematology will contact you to schedule an appointment.    Once I receive your labs, you will be contacted regarding your results.      Enlarged Spleen An enlarged spleen (splenomegaly) is when the spleen is larger than normal. This condition is usually noticed when the spleen is almost twice its normal size. The spleen is an organ that is located in the upper left area of the abdomen, just under the ribs. The spleen is like a storage unit for red blood cells, and it also works to filter and clean the blood. It destroys cells that are damaged or worn out. The spleen is also important for fighting disease. An enlarged spleen is usually a sign of another health problem. What are the causes? This condition may be caused by:  Mononucleosis and some other viral infections.  Infection with certain bacteria or parasites.  Liver failure (cirrhosis) and other liver diseases.  Blood diseases, such as hemolytic anemia.  An overactive spleen (hypersplenism).  Blood cancers, such as leukemia or Hodgkin disease.  Metabolic disorders, such as Gaucher disease or Niemann-Pick disease.  Tumors and cysts.  Pressure or blood clots in the veins of the spleen.  Connective tissue disorders, such as lupus or rheumatoid arthritis.  What are the signs or symptoms? Symptoms of this condition include:  Pain in the upper left part of the abdomen. The pain may spread to the left shoulder or get worse when you take a breath.  Feeling full without eating or after eating only a small amount.  Feeling tired.  Chronic infections.  Bleeding or bruising easily.  In some cases, there are no symptoms. How is this diagnosed? This condition may be diagnosed during a physical exam when the health care provider feels the left upper part of your  abdomen. You may also have tests, such as:  Blood tests to check red and white blood cells and other proteins and enzymes.  Imaging tests, such as an abdominal ultrasound, CT scan, or MRI.  Taking a tissue sample (biopsy) of the liver or bone marrow if there is concern that it is the cause of an enlarged spleen.  How is this treated? Treatment for this condition depends on the cause. Treatment aims to manage the conditions that cause swelling of the spleen and reduce the size of the spleen. Treatment may include:  Medicines to treat infection or disease.  Radiation therapy.  Blood transfusions.  Vaccinations.  If these treatments do not help or if the cause cannot be found, surgery to remove the spleen (splenectomy) may be recommended. Follow these instructions at home:  Take over-the-counter and prescription medicines only as told by your health care provider.  If you were prescribed an antibiotic medicine, take it as told by your health care provider. Do not stop taking the antibiotic even if you start to feel better.  Follow instructions from your health care provider about limiting your activities. To avoid injury or a ruptured spleen, make sure you: ? Avoid contact sports. ? Wear a seat belt in the car.  Keep all follow-up visits as told by your health care provider. This is important. Contact a health care provider if:  Your symptoms do not improve as expected.  You have a fever or chills.  You feel generally ill.  You have increased pain when  you take in a breath. Get help right away if:  You experience an injury or impact to the spleen area.  Your abdominal pain becomes severe.  You feel dizzy or you faint.  You feel very weak.  You have cold and clammy skin.  You have sweating for no reason.  You have chest pain or difficulty breathing. This information is not intended to replace advice given to you by your health care provider. Make sure you discuss  any questions you have with your health care provider. Document Released: 12/30/2009 Document Revised: 12/18/2015 Document Reviewed: 12/30/2014 Elsevier Interactive Patient Education  2018 [employer]

## 2017-08-12 NOTE — Progress Notes (Signed)
Patient ID: Austin [name], male    DOB: [birth year], 27 y.o.   MRN: [id number]  PCP: Bing NeighborsHarris, Jesselee Poth S, FNP  Chief Complaint  Patient presents with  . Establish Care  . Sickle Cell Anemia    trait of disease  . Generalized Body Aches    Subjective:  HPI Austin [name] is a 27 y.o. male presents to establish care and for a emergency department follow-up. Austin [name] reports a medical history significant for sickle cell trait, recently diagnosed with an enlarged spleen, and several months of generalized body aches. Austin [name] has presented to the ED over the last year multiple times for varying symptoms. Of recent,07/16/2017, [facility] of flank pain, in which renal stone was ruled out. However he was found to have an enlarged spleen and platelets. Reports since ED visit in December , pain is consistently on the left side of his body. Denies any prior history of surgery or viral illnesses. He thinks he may have had mononucleosis in high school, but uncertain. Unknown of fevers and temperature intolerance. Associated symptoms include loose stools only. He denies dizziness, fatigue, chest pain, prolonged bleeding, or enlarged lymph nodes.  He is concerned that he has sickle cell disease opposed to sickle cell trait.  He reports that his brother has sickle cell disease. No prior hemoglobinopathy studies.  Social History   Socioeconomic History  . Marital status: Married    Spouse name: Not on file  . Number of children: Not on file  . Years of education: Not on file  . Highest education level: Not on file  Social Needs  . Financial resource strain: Not on file  . Food insecurity - worry: Not on file  . Food insecurity - inability: Not on file  . Transportation needs - medical: Not on file  . Transportation needs - non-medical: Not on file  Occupational History  . Not on file  Tobacco Use  . Smoking status: Current Some Day Smoker    Packs/day: 0.10   Types: Cigars  . Smokeless tobacco: Never Used  Substance and Sexual Activity  . Alcohol use: Yes  . Drug use: Yes    Types: Marijuana  . Sexual activity: Not on file  Other Topics Concern  . Not on file  Social History Narrative  . Not on file    Family History  Problem Relation Age of Onset  . Sickle cell trait Mother   . Diabetes Father   . Hypertension Father      Review of Systems  HENT: Negative.   Respiratory: Negative.   Cardiovascular: Negative.   Gastrointestinal:       Left flank and LUQ pain  Loose stools   Genitourinary: Negative.   Musculoskeletal: Positive for arthralgias. Negative for gait problem and joint swelling.  Neurological: Negative.   Hematological: Negative.   Psychiatric/Behavioral: Negative.    No Known Allergies  Prior to Admission medications   Medication Sig Start Date End Date Taking? Authorizing Provider  ondansetron (ZOFRAN ODT) 4 MG disintegrating tablet Take 1 tablet (4 mg total) by mouth every 8 (eight) hours as needed. 07/16/17  Yes Jacalyn LefevreHaviland, Julie, MD  diclofenac (CATAFLAM) 50 MG tablet Take 1 tablet (50 mg total) by mouth 3 (three) times daily. One tablet TID with food prn pain. Patient not taking: Reported on 10/21/2016 08/16/15   Hayden RasmussenMabe, David, NP  dicyclomine (BENTYL) 20 MG tablet Take 1 tablet (20 mg total) by mouth 2 (two)  times daily. Patient not taking: Reported on 08/12/2017 05/25/17   Arthor Captain, PA-C  HYDROcodone-acetaminophen (NORCO/VICODIN) 5-325 MG tablet Take 1 tablet by mouth every 4 (four) hours as needed. Patient not taking: Reported on 08/12/2017 07/16/17   Jacalyn Lefevre, MD  ibuprofen (ADVIL,MOTRIN) 800 MG tablet Take 1 tablet (800 mg total) by mouth every 8 (eight) hours as needed. Patient not taking: Reported on 05/25/2017 11/24/16   Long, Arlyss Repress, MD  indomethacin (INDOCIN) 25 MG capsule Take 2 capsules (50 mg total) by mouth 3 (three) times daily as needed. Patient not taking: Reported on 10/21/2016 07/21/15    Dowless, Lelon Mast Tripp, PA-C  loperamide (IMODIUM) 2 MG capsule Take 1 capsule (2 mg total) by mouth 4 (four) times daily as needed for diarrhea or loose stools. Patient not taking: Reported on 08/12/2017 05/25/17   Arthor Captain, PA-C  meloxicam (MOBIC) 15 MG tablet Take 1 tablet (15 mg total) by mouth daily. Take 1 daily with food. Patient not taking: Reported on 08/12/2017 05/25/17   Arthor Captain, PA-C  methocarbamol (ROBAXIN) 500 MG tablet Take 1 tablet (500 mg total) by mouth 2 (two) times daily. Patient not taking: Reported on 05/25/2017 11/24/16   Long, Arlyss Repress, MD  naproxen (NAPROSYN) 250 MG tablet Take 1 tablet (250 mg total) by mouth 2 (two) times daily with a meal. Patient not taking: Reported on 10/21/2016 03/13/15   Everlene Farrier, PA-C  ondansetron (ZOFRAN) 4 MG tablet Take 1 tablet (4 mg total) by mouth every 8 (eight) hours as needed for nausea or vomiting. Patient not taking: Reported on 08/12/2017 05/25/17   Arthor Captain, PA-C  Tetrahydrozoline HCl (VISINE OP) Place 1-2 drops into both eyes daily as needed (red eye).    [provider]  traMADol (ULTRAM) 50 MG tablet Take 1 tablet (50 mg total) by mouth every 12 (twelve) hours as needed for severe pain. Patient not taking: Reported on 10/21/2016 09/21/15   Sharin Mons    Past Medical, Surgical Family and Social History reviewed and updated.    Objective:   Today's Vitals   08/12/17 0909  BP: 134/88  Pulse: 77  Resp: 16  Temp: 97.6 F (36.4 C)  TempSrc: Oral  SpO2: 97%  Weight: 173 lb (78.5 kg)  Height: 5\' 7"  (1.702 m)    Wt Readings from Last 3 Encounters:  08/12/17 173 lb (78.5 kg)  07/16/17 175 lb (79.4 kg)  11/24/16 164 lb (74.4 kg)    Physical Exam  Constitutional: He is oriented to person, place, and time. He appears well-developed and well-nourished.  HENT:  Head: Normocephalic and atraumatic.  Nose: Nose normal.  Mouth/Throat: Oropharynx is clear and moist.  Eyes: EOM are  normal. Pupils are equal, round, and reactive to light. No scleral icterus.  Neck: Normal range of motion.  Cardiovascular: Normal rate, regular rhythm, normal heart sounds and intact distal pulses.  Pulmonary/Chest: Effort normal and breath sounds normal.  Abdominal: Soft. Bowel sounds are normal. He exhibits no distension. There is splenomegaly. There is no hepatomegaly. There is tenderness in the left upper quadrant and left lower quadrant. There is rebound. There is no rigidity, no guarding, no tenderness at McBurney's point and negative Murphy's sign. No hernia. Hernia confirmed negative in the ventral area.  Increased fullness and with mild tenderness with deep palpation only  Musculoskeletal: Normal range of motion.  Lymphadenopathy:    He has no cervical adenopathy.  Neurological: He is alert and oriented to person, place, and time.  Skin: Skin is warm and dry.  Psychiatric: He has a normal mood and affect. His behavior is normal. Thought content normal.   Assessment & Plan:  1. Spleen enlarged, LUQ fullness with mild tenderness on exam.  He was found to have splenomegaly during a recent ED visit.  He reports diabetes aching of his entire left side.  I am referring patient to hematology for further evaluation of splenomegaly and thrombocytopenia.  Patient has known sickle cell trait.  No recent hemoglobinopathy on file.  Will obtain  hemoglobinopathy to evaluate sickle cell status.  Referring patient to hematology.  2. Thrombocytopenia (HCC), idiopathic, unknown period time, low platelet count has been present. Last  platelet count 147 (1/18/190. Repeat CBC and CMP with Reticulocyte count   3. 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  Patient to follow-up with hematology.  4. Screening for diabetes mellitus, (Hb A1C) 4.2, normal. Repeat in 12 months.  5. Screening for thyroid disorder, - Thyroid Panel With TSH  6.  Left sided abdominal pain, -  likely secondary to enlarged spleen and possible visceral deferred pain.   Will trial tylenol # 3 every 6 hours as needed for severe pain.  You will be notified of any abnormal lab results. You have been referred to hematology they will contact you to schedule appointment. Continue ibuprofen 600 mg every 8 hours as needed for pain.  For severe left-sided pain I am prescribing you a limited course of Tylenol 3 which she may take 1 every 6 hours as needed.  Patient advised extensively that medication will not be prescribed on a chronic basis. Patient inquired about FMLA benefits, he was advised to follow-up with his HR department.   RTC: 3 months for CPE   [employer]. Tiburcio Pea, MSN, FNP-C [facility]  [address] [place], [place] [address] [phone]

## 2017-08-17 LAB — CBC WITH DIFFERENTIAL/PLATELET
BASOS ABS: 0 10*3/uL (ref 0.0–0.2)
BASOS: 0 %
EOS (ABSOLUTE): 0.1 10*3/uL (ref 0.0–0.4)
EOS: 2 %
HEMOGLOBIN: 14.5 g/dL (ref 13.0–17.7)
Hematocrit: 42.9 % (ref 37.5–51.0)
Immature Grans (Abs): 0 10*3/uL (ref 0.0–0.1)
Immature Granulocytes: 0 %
LYMPHS ABS: 2.1 10*3/uL (ref 0.7–3.1)
LYMPHS: 26 %
MCH: 27.5 pg (ref 26.6–33.0)
MCHC: 33.8 g/dL (ref 31.5–35.7)
MCV: 81 fL (ref 79–97)
MONOCYTES: 8 %
Monocytes Absolute: 0.7 10*3/uL (ref 0.1–0.9)
NEUTROS ABS: 5.3 10*3/uL (ref 1.4–7.0)
Neutrophils: 64 %
PLATELETS: 147 10*3/uL — AB (ref 150–379)
RBC: 5.27 x10E6/uL (ref 4.14–5.80)
RDW: 16.4 % — ABNORMAL HIGH (ref 12.3–15.4)
WBC: 8.2 10*3/uL (ref 3.4–10.8)

## 2017-08-17 LAB — HEMOGLOBINOPATHY EVALUATION
HEMOGLOBIN F QUANTITATION: 2.6 % — AB (ref 0.0–2.0)
HGB A: 0 % — AB (ref 96.4–98.8)
HGB C: 94 % — AB
HGB S: 0 %
HGB VARIANT: 0 %
Hemoglobin A2 Quantitation: 3.4 % — ABNORMAL HIGH (ref 1.8–3.2)

## 2017-08-17 LAB — THYROID PANEL WITH TSH
FREE THYROXINE INDEX: 2 (ref 1.2–4.9)
T3 Uptake Ratio: 31 % (ref 24–39)
T4, Total: 6.6 ug/dL (ref 4.5–12.0)
TSH: 0.662 u[IU]/mL (ref 0.450–4.500)

## 2017-08-17 LAB — RETICULOCYTES: Retic Ct Pct: 5.1 % — ABNORMAL HIGH (ref 0.6–2.6)

## 2017-08-18 DIAGNOSIS — R161 Splenomegaly, not elsewhere classified: Secondary | ICD-10-CM | POA: Insufficient documentation

## 2017-08-22 ENCOUNTER — Inpatient Hospital Stay: Payer: 59 | Attending: Hematology and Oncology | Admitting: Hematology and Oncology

## 2017-08-22 ENCOUNTER — Encounter: Payer: Self-pay | Admitting: Hematology and Oncology

## 2017-08-22 VITALS — BP 106/65 | HR 71 | Temp 97.8°F | Resp 18 | Ht 67.0 in | Wt 175.5 lb

## 2017-08-22 DIAGNOSIS — Z79899 Other long term (current) drug therapy: Secondary | ICD-10-CM | POA: Diagnosis not present

## 2017-08-22 DIAGNOSIS — D582 Other hemoglobinopathies: Secondary | ICD-10-CM

## 2017-08-22 DIAGNOSIS — D696 Thrombocytopenia, unspecified: Secondary | ICD-10-CM | POA: Insufficient documentation

## 2017-08-22 DIAGNOSIS — F17210 Nicotine dependence, cigarettes, uncomplicated: Secondary | ICD-10-CM | POA: Diagnosis not present

## 2017-08-22 DIAGNOSIS — D573 Sickle-cell trait: Secondary | ICD-10-CM | POA: Insufficient documentation

## 2017-08-22 DIAGNOSIS — R110 Nausea: Secondary | ICD-10-CM | POA: Diagnosis not present

## 2017-08-22 DIAGNOSIS — R161 Splenomegaly, not elsewhere classified: Secondary | ICD-10-CM | POA: Insufficient documentation

## 2017-08-22 MED ORDER — MORPHINE SULFATE 15 MG PO TABS: 15.0000 mg | ORAL_TABLET | ORAL | 0 refills | Status: DC | PRN

## 2017-08-22 MED ORDER — ONDANSETRON HCL 4 MG PO TABS: 4.0000 mg | ORAL_TABLET | Freq: Three times a day (TID) | ORAL | 0 refills | Status: DC | PRN

## 2017-08-22 NOTE — Assessment & Plan Note (Signed)
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  Surprisingly, patient is not anemic and has not had any significant symptoms until now.  His splenomegaly is consistent with his underlying hematological disorder and may be representative of splenic sequestration considering the pain and discomfort.  Minimal thrombocytopenia observed in the last CBCs will explainable by his splenomegaly  Plan: -Short-acting morphine for pain control -Ondansetron for nausea -Ultrasound spleen with Doppler to assess for splenic blood flow/infarct areas -Refer to sickle cell disease clinic for follow-up.

## 2017-08-22 NOTE — Progress Notes (Signed)
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  Surprisingly, patient is not anemic and has not had any significant symptoms until now.  His splenomegaly is consistent with his underlying hematological disorder and may be representative of splenic sequestration considering the pain and discomfort.  Minimal thrombocytopenia observed in the last CBCs will explainable by his splenomegaly  Plan: -Short-acting morphine for pain control -Ondansetron for nausea -Ultrasound spleen with Doppler to assess for splenic blood flow/infarct areas -Refer to sickle cell disease clinic for follow-up.  Voice recognition software was used and creation of this note. Despite my best effort at editing the text, some misspelling/errors may have occurred.  Orders Placed This Encounter  Procedures  . US Abdomen Limited    Standing Status:   Future    Standing Expiration Date:   08/22/2018    Order Specific Question:   Reason for Exam (SYMPTOM  OR DIAGNOSIS REQUIRED)    Answer:   Splenomegaly, abdominal pain, new diagnosis of Hgb C disease -- pelase eval splenic blood flow for infarction sites    Order Specific Question:   Preferred imaging location?    Answer:   [facility]    All questions were answered.  . The patient knows to call the clinic with any problems, questions or concerns.  This note was electronically signed.    History of Presenting Illness Austin [name] 27 y.o. presenting [facility] the [facility] for evaluation of splenomegaly, referred by Bing Neighbors, FNP.  Patient's family history is significant for a younger brother with sickle cell disease.  Patient did carry a diagnosis of sickle cell trait himself although previously has had no significant evaluation for the disease.  He initially presented to his primary care provider with left flank/left upper  quadrant discomfort.  Patient denies any fevers, chills, night sweats.  No significant nausea, vomiting, loss of appetite, or decrease in activity level.  Patient does notice that the pain has limited his ability to work.  He has attempted to take codeine with acetaminophen, but medication was too sedating.  Oncological/hematological History: --Labs, 08/12/17: WBC 8.2, Hgb 14.5, MCV 81.0, MCH 27.5, Plt 147; Hgb EP -- Hgb A 0%, Hgb F 2.6%, Hgb A2 3.4%, Hgb C 94%   Medical History: Past Medical History:  Diagnosis Date  . Eczema   . Sickle cell trait [facility])     Surgical History: History reviewed. No pertinent surgical history.  Family History: Family History  Problem Relation Age of Onset  . Sickle cell trait Mother   . Diabetes Father   . Hypertension Father     Social History: Social History   Socioeconomic History  . Marital status: Married    Spouse name: Not on file  . Number of children: Not on file  . Years of education: Not on file  . Highest education level: Not on file  Social Needs  . Financial resource strain: Not on file  . Food insecurity - worry: Not on file  . Food insecurity - inability: Not on file  . Transportation needs - medical: Not on file  . Transportation needs - non-medical: Not on file  Occupational History  . Not on file  Tobacco Use  . Smoking status: Current Some Day Smoker    Packs/day: 0.10    Types: Cigars  . Smokeless tobacco: Never Used  Substance and Sexual Activity  . Alcohol use:  Yes  . Drug use: Yes    Types: Marijuana  . Sexual activity: Not on file  Other Topics Concern  . Not on file  Social History Narrative  . Not on file    Allergies: No Known Allergies  Medications:  Current Outpatient Medications  Medication Sig Dispense Refill  . Tetrahydrozoline HCl (VISINE OP) Place 1-2 drops into both eyes daily as needed (red eye).    Marland Kitchen. morphine (MSIR) 15 MG tablet Take 1 tablet (15 mg total) by mouth every 4 (four) hours  as needed for severe pain. 25 tablet 0  . ondansetron (ZOFRAN) 4 MG tablet Take 1 tablet (4 mg total) by mouth every 8 (eight) hours as needed for nausea or vomiting. 25 tablet 0   No current facility-administered medications for this visit.     Review of Systems: Review of Systems  Musculoskeletal: Positive for flank pain.  All other systems reviewed and are negative.    PHYSICAL EXAMINATION Blood pressure 106/65, pulse 71, temperature 97.8 F (36.6 C), temperature source Oral, resp. rate 18, height 5\' 7"  (1.702 m), weight 175 lb 8 oz (79.6 kg), SpO2 100 %.  ECOG PERFORMANCE STATUS: 1 - Symptomatic but completely ambulatory  Physical Exam  Constitutional: He is oriented to person, place, and time and well-developed, well-nourished, and in no distress. No distress.  HENT:  Head: Normocephalic and atraumatic.  Mouth/Throat: Oropharynx is clear and moist. No oropharyngeal exudate.  Eyes: Conjunctivae and EOM are normal. Pupils are equal, round, and reactive to light. No scleral icterus.  Neck: No thyromegaly present.  Cardiovascular: Normal rate, regular rhythm and normal heart sounds.  No murmur heard. Pulmonary/Chest: Effort normal and breath sounds normal. No respiratory distress. He has no wheezes. He has no rales.  Abdominal: Soft. Bowel sounds are normal. He exhibits no distension and no mass. There is tenderness. There is no guarding.  Splenomegaly is noted without hepatomegaly.  Musculoskeletal: He exhibits no edema.  Lymphadenopathy:    He has no cervical adenopathy.  Neurological: He is alert and oriented to person, place, and time. No cranial nerve deficit.  Skin: Skin is warm and dry. No rash noted. He is not diaphoretic. No erythema. No pallor.     LABORATORY DATA: I have personally reviewed the data as listed: No visits with results within 1 Week(s) from this visit.  Latest known visit with results is:  Office Visit on 08/12/2017  Component Date Value Ref Range  Status  . Hemoglobin A1C 08/12/2017 4.2   Final  . Glucose, UA 08/12/2017 NEGATIVE  NEGATIVE mg/dL Final  . Bilirubin Urine 08/12/2017 SMALL* NEGATIVE Final  . Ketones, ur 08/12/2017 NEGATIVE  NEGATIVE mg/dL Final  . Specific Gravity, Urine 08/12/2017 >=1.030  1.005 - 1.030 Final  . Hgb urine dipstick 08/12/2017 NEGATIVE  NEGATIVE Final  . pH 08/12/2017 5.5  5.0 - 8.0 Final  . Protein, ur 08/12/2017 NEGATIVE  NEGATIVE mg/dL Final  . Urobilinogen, UA 08/12/2017 1.0  0.0 - 1.0 mg/dL Final  . Nitrite 16/10/960401/18/2019 NEGATIVE  NEGATIVE Final  . Leukocytes, UA 08/12/2017 NEGATIVE  NEGATIVE Final   Biochemical Testing Only. Please order routine urinalysis from main lab if confirmatory testing is needed.  . Hemoglobin F Quantitation 08/12/2017 2.6* 0.0 - 2.0 % Final  . Hgb A 08/12/2017 0.0* 96.4 - 98.8 % Final  . HGB S 08/12/2017 0.0  0.0 % Final  . HGB C 08/12/2017 94.0* 0.0 % Final  . Hemoglobin A2 Quantitation 08/12/2017 3.4* 1.8 - 3.2 %  Final  . HGB VARIANT 08/12/2017 0.0  0.0 % Final  . HGB INTERPRETATION 08/12/2017 Comment   Final   Comment: Hemoglobin pattern and concentration are consistent with homozygous C disease. Suggest clinical and hematologic correlation.             C Disease Interpretation Ranges             Hgb F       0.0 - 10.0%             Hgb C      85.0 - 97.0%             Hgb A2      2.0 -  4.5%   . WBC 08/12/2017 8.2  3.4 - 10.8 x10E3/uL Final  . RBC 08/12/2017 5.27  4.14 - 5.80 x10E6/uL Final   Target cells present.  . Hemoglobin 08/12/2017 14.5  13.0 - 17.7 g/dL Final  . Hematocrit 16/04/9603 42.9  37.5 - 51.0 % Final  . MCV 08/12/2017 81  79 - 97 fL Final  . MCH 08/12/2017 27.5  26.6 - 33.0 pg Final  . MCHC 08/12/2017 33.8  31.5 - 35.7 g/dL Final  . RDW 54/03/8118 16.4* 12.3 - 15.4 % Final  . Platelets 08/12/2017 147* 150 - 379 x10E3/uL Final  . Neutrophils 08/12/2017 64  Not Estab. % Final  . Lymphs 08/12/2017 26  Not Estab. % Final  . Monocytes 08/12/2017 8   Not Estab. % Final  . Eos 08/12/2017 2  Not Estab. % Final  . Basos 08/12/2017 0  Not Estab. % Final  . Neutrophils Absolute 08/12/2017 5.3  1.4 - 7.0 x10E3/uL Final  . Lymphocytes Absolute 08/12/2017 2.1  0.7 - 3.1 x10E3/uL Final  . Monocytes Absolute 08/12/2017 0.7  0.1 - 0.9 x10E3/uL Final  . EOS (ABSOLUTE) 08/12/2017 0.1  0.0 - 0.4 x10E3/uL Final  . Basophils Absolute 08/12/2017 0.0  0.0 - 0.2 x10E3/uL Final  . Immature Granulocytes 08/12/2017 0  Not Estab. % Final  . Immature Grans (Abs) 08/12/2017 0.0  0.0 - 0.1 x10E3/uL Final  . Hematology Comments: 08/12/2017 Note:   Final   Verified by microscopic examination.  . TSH 08/12/2017 0.662  0.450 - 4.500 uIU/mL Final  . T4, Total 08/12/2017 6.6  4.5 - 12.0 ug/dL Final  . T3 Uptake Ratio 08/12/2017 31  24 - 39 % Final  . Free Thyroxine Index 08/12/2017 2.0  1.2 - 4.9 Final  . Retic Ct Pct 08/12/2017 5.1* 0.6 - 2.6 % Final         Daisy Blossom, MD

## 2017-09-07 ENCOUNTER — Telehealth: Payer: Self-pay

## 2017-09-07 NOTE — Telephone Encounter (Signed)
Patient states that he needs a note for work stating that he is being treated by you and you are prescribing him the Tylenol 3. Patient also wants to know if you are able to fill out his FMLA for work when he is in crisis.

## 2017-09-08 ENCOUNTER — Ambulatory Visit (HOSPITAL_COMMUNITY)
Admission: RE | Admit: 2017-09-08 | Discharge: 2017-09-08 | Disposition: A | Discharge status: Home / Self Care | Payer: 59 | Source: Ambulatory Visit | Attending: Hematology and Oncology | Admitting: Hematology and Oncology

## 2017-09-08 ENCOUNTER — Other Ambulatory Visit: Payer: Self-pay | Admitting: Hematology and Oncology

## 2017-09-08 DIAGNOSIS — R109 Unspecified abdominal pain: Secondary | ICD-10-CM | POA: Insufficient documentation

## 2017-09-08 DIAGNOSIS — D582 Other hemoglobinopathies: Secondary | ICD-10-CM | POA: Diagnosis not present

## 2017-09-08 DIAGNOSIS — R161 Splenomegaly, not elsewhere classified: Secondary | ICD-10-CM | POA: Insufficient documentation

## 2017-09-10 NOTE — Telephone Encounter (Signed)
Patient will need to schedule a follow-up appointment-bring FMLA forms, turn-around-time 7-14 days from the day of his follow-up appointment, and fees 10.00 to complete paperwork.   Godfrey PickKimberly S. Tiburcio PeaHarris, MSN, FNP-C [facility]  [address] [place], [place] [id number] [phone]

## 2017-09-12 NOTE — Telephone Encounter (Signed)
Patient notified and come in for appointment

## 2017-09-13 ENCOUNTER — Ambulatory Visit: Payer: 59 | Admitting: Family Medicine

## 2017-10-14 ENCOUNTER — Telehealth: Payer: Self-pay

## 2017-10-14 ENCOUNTER — Ambulatory Visit: Payer: 59 | Admitting: Family Medicine

## 2017-10-18 ENCOUNTER — Ambulatory Visit (INDEPENDENT_AMBULATORY_CARE_PROVIDER_SITE_OTHER): Payer: 59 | Admitting: Family Medicine

## 2017-10-18 ENCOUNTER — Encounter: Payer: Self-pay | Admitting: Family Medicine

## 2017-10-18 VITALS — BP 124/80 | HR 88 | Temp 98.0°F | Ht 67.0 in | Wt 172.0 lb

## 2017-10-18 DIAGNOSIS — G894 Chronic pain syndrome: Secondary | ICD-10-CM

## 2017-10-18 DIAGNOSIS — D696 Thrombocytopenia, unspecified: Secondary | ICD-10-CM | POA: Diagnosis not present

## 2017-10-18 DIAGNOSIS — D582 Other hemoglobinopathies: Secondary | ICD-10-CM

## 2017-10-18 LAB — POCT URINALYSIS DIP (DEVICE)
Glucose, UA: NEGATIVE mg/dL
HGB URINE DIPSTICK: NEGATIVE
Ketones, ur: NEGATIVE mg/dL
LEUKOCYTES UA: NEGATIVE
NITRITE: NEGATIVE
Protein, ur: NEGATIVE mg/dL
Specific Gravity, Urine: >=1.03 (ref 1.005–1.030)
Urobilinogen, UA: 1 mg/dL (ref 0.0–1.0)
pH: 5 (ref 5.0–8.0)

## 2017-10-18 NOTE — Patient Instructions (Addendum)
The cancer center [phone]-Dr. Perlov to obtain results of abdominal ultrasound.   Continue follow-up with hematology.  I recommend folic acid to improve red blood cell production.  You may pick-up a copy of your FMLA paperwork 1 week from today.    Sickle Cell Anemia, Adult Sickle cell anemia is a condition where your red blood cells are shaped like sickles. Red blood cells carry oxygen through the body. Sickle-shaped red blood cells do not live as long as normal red blood cells. They also clump together and block blood from flowing through the blood vessels. These things prevent the body from getting enough oxygen. Sickle cell anemia causes organ damage and pain. It also increases the risk of infection. Follow these instructions at home:  Drink enough fluid to keep your pee (urine) clear or pale yellow. Drink more in hot weather and during exercise.  Do not smoke. Smoking lowers oxygen levels in the blood.  Only take over-the-counter or prescription medicines as told by your doctor.  Take antibiotic medicines as told by your doctor. Make sure you finish them even if you start to feel better.  Take supplements as told by your doctor.  Consider wearing a medical alert bracelet. This tells anyone caring for you in an emergency of your condition.  When traveling, keep your medical information, doctors' names, and the medicines you take with you at all times.  If you have a fever, do not take fever medicines right away. This could cover up a problem. Tell your doctor.  Keep all follow-up visits with your doctor. Sickle cell anemia requires regular medical care. Contact a doctor if: You have a fever. Get help right away if:  You feel dizzy or faint.  You have new belly (abdominal) pain, especially on the left side near the stomach area.  You have a lasting, often uncomfortable and painful erection of the penis (priapism). If it is not treated right away, you will become unable to  have sex (impotence).  You have numbness in your arms or legs or you have a hard time moving them.  You have a hard time talking.  You have a fever or lasting symptoms for more than 2-3 days.  You have a fever and your symptoms suddenly get worse.  You have signs or symptoms of infection. These include: ? Chills. ? Being more tired than normal (lethargy). ? Irritability. ? Poor eating. ? Throwing up (vomiting).  You have pain that is not helped with medicine.  You have shortness of breath.  You have pain in your chest.  You are coughing up pus-like or bloody mucus.  You have a stiff neck.  Your feet or hands swell or have pain.  Your belly looks bloated.  Your joints hurt. This information is not intended to replace advice given to you by your health care provider. Make sure you discuss any questions you have with your health care provider. Document Released: 05/02/2013 Document Revised: 12/18/2015 Document Reviewed: 02/21/2013 Elsevier Interactive Patient Education  2017 [employer]

## 2017-10-18 NOTE — Progress Notes (Signed)
Patient ID: Austin [name], male    DOB: 12/29/1990, 27 y.o.   MRN: [id number]  PCP: Austin Neighbors, FNP  Chief Complaint  Patient presents with  . Annual Exam  . FMLA    Subjective:  HPI Austin [name] is a 27 y.o. male newly diagnosed with sickle cell disease, Hemoglobin C-C presents requesting completion of FMLA. paperwork. Austin [name] has presented to the ED several times over the course of last few years with multiple complaints related to arthralgias and generalized musculoskeletal pain.  He was found to have splenomegaly and thrombocytopenia thought to have sickle cell trait. He was referred to hematology for further work-up. Hematologist, Dr. Gweneth [name] confirmed through hemoglobinopathy patient suffers from Sickle Cell anemia C-C disease. He was prescribed morphine for chronic pain and complains today that he continue to have pain. He doesn't have his FMLA completed therefore he is afraid to miss work and unable to go to ED in fear of loosing job. Rates his average pain intensity as 7/10 daily and is generalized. Pain peaks around 9-10/10 and he reports he has had this intensity of pain over the last few weeks. He denies chest pain, shortness of breath, dizziness, or new weakness. Social History   Socioeconomic History  . Marital status: Married    Spouse name: Not on file  . Number of children: Not on file  . Years of education: Not on file  . Highest education level: Not on file  Occupational History  . Not on file  Social Needs  . Financial resource strain: Not on file  . Food insecurity:    Worry: Not on file    Inability: Not on file  . Transportation needs:    Medical: Not on file    Non-medical: Not on file  Tobacco Use  . Smoking status: Current Some Day Smoker    Packs/day: 0.10    Types: Cigars  . Smokeless tobacco: Never Used  Substance and Sexual Activity  . Alcohol use: Yes  . Drug use: Yes    Types: Marijuana  . Sexual activity: Not on file   Lifestyle  . Physical activity:    Days per week: Not on file    Minutes per session: Not on file  . Stress: Not on file  Relationships  . Social connections:    Talks on phone: Not on file    Gets together: Not on file    Attends religious service: Not on file    Active member of club or organization: Not on file    Attends meetings of clubs or organizations: Not on file    Relationship status: Not on file  . Intimate partner violence:    Fear of current or ex partner: Not on file    Emotionally abused: Not on file    Physically abused: Not on file    Forced sexual activity: Not on file  Other Topics Concern  . Not on file  Social History Narrative  . Not on file    Family History  Problem Relation Age of Onset  . Sickle cell trait Mother   . Diabetes Father   . Hypertension Father    Review of Systems Pertinent negatives listed in HPI  Patient Active Problem List   Diagnosis Date Noted  . 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 Spleen enlarged 08/18/2017    No Known Allergies  Prior to Admission medications   Medication Sig Start Date End Date Taking? Authorizing Provider  morphine (  MSIR) 15 MG tablet Take 1 tablet (15 mg total) by mouth every 4 (four) hours as needed for severe pain. 08/22/17  Yes Perlov, Jene Every, MD  Tetrahydrozoline HCl (VISINE OP) Place 1-2 drops into both eyes daily as needed (red eye).   Yes [provider]  ondansetron (ZOFRAN) 4 MG tablet Take 1 tablet (4 mg total) by mouth every 8 (eight) hours as needed for nausea or vomiting. Patient not taking: Reported on 10/18/2017 08/22/17   Daisy Blossom, MD    Past Medical, Surgical Family and Social History reviewed and updated.    Objective:   Today's Vitals   10/18/17 0814  BP: 124/80  Pulse: 88  Temp: 98 F (36.7 C)  TempSrc: Oral  SpO2: 99%  Weight: 172 lb (78 kg)  Height:  (1.702 m)    Wt Readings from Last 3 Encounters:  10/18/17 172 lb (78 kg)   08/22/17 175 lb 8 oz (79.6 kg)  08/12/17 173 lb (78.5 kg)   Physical Exam Constitutional: Patient appears well-developed and well-nourished. No distress. HENT: Normocephalic, atraumatic, External right and left ear normal. Oropharynx is clear and moist.  Eyes: Conjunctivae and EOM are normal. PERRLA, no scleral icterus. Neck: Normal ROM. Neck supple. No JVD. No tracheal deviation. No thyromegaly. CVS: RRR, S1/S2 +, no murmurs, no gallops, no carotid bruit.  Pulmonary: Effort and breath sounds normal, no stridor, rhonchi, wheezes, rales.  Abdominal: Soft. BS +, no distension, tenderness, rebound or guarding.  Musculoskeletal: Normal range of motion. No edema and no tenderness.  Lymphadenopathy: No lymphadenopathy noted, cervical, inguinal or axillary Neuro: Alert. Normal reflexes, muscle tone coordination. No cranial nerve deficit. Skin: Skin is warm and dry. No rash noted. Not diaphoretic. No erythema. No pallor. Psychiatric: Normal mood and affect. Behavior, judgment, thought content normal.   Assessment & Plan:  1. Hemoglobin C-C disease (HCC), newly diagnosed. Currently opts to continue pain management through hematology. Patient advised he will be required to submit to urine drug screen and sign a medication contract.    Pulmonary evaluation - Patient denies severe recurrent wheezes, shortness of breath with exercise, or persistent cough. If these symptoms develop, pulmonary function tests with spirometry will be ordered, and if abnormal, plan on referral to Pulmonology for further evaluation.  Eye - High risk of proliferative retinopathy. Annual eye exam with retinal exam recommended to patient, the patient has had eye exam this year. Refer ophthalmology.   Immunization status - Yearly influenza vaccination is recommended, as well as being up to date with Meningococcal and Pneumococcal vaccines.   2. Chronic pain syndrome, continue pain management through hematology. Recommended  conservative treatment with ibuprofen and warm compresses. Encouraged hydration with  6-8 glasses of water per day.   3. Thrombocytopenia (HCC), continue hematology management.   Orders Placed This Encounter  Procedures  . CBC with Differential  . Comprehensive metabolic panel  . Reticulocytes  . Iron, TIBC and Ferritin Panel  . Ambulatory referral to Ophthalmology  . POCT urinalysis dip (device)     Return in about 12 weeks for Sickle Cell Disease/Pain.   The patient was given clear instructions to go to ER or return to medical center if symptoms don't improve, worsen or new problems develop. The patient verbalized understanding. The patient was told to call to get lab results if they haven't heard anything in the next week.     Godfrey Pick. Tiburcio Pea, MSN, FNP-C [facility]  [address]  [address] [place], [place] [address] [phone]

## 2017-10-19 LAB — IRON,TIBC AND FERRITIN PANEL
Ferritin: 439 ng/mL — ABNORMAL HIGH (ref 30–400)
IRON SATURATION: 22 % (ref 15–55)
IRON: 59 ug/dL (ref 38–169)
Total Iron Binding Capacity: 271 ug/dL (ref 250–450)
UIBC: 212 ug/dL (ref 111–343)

## 2017-10-19 LAB — COMPREHENSIVE METABOLIC PANEL
ALBUMIN: 4.5 g/dL (ref 3.5–5.5)
ALT: 18 IU/L (ref 0–44)
AST: 11 IU/L (ref 0–40)
Albumin/Globulin Ratio: 1.5 (ref 1.2–2.2)
Alkaline Phosphatase: 58 IU/L (ref 39–117)
BUN / CREAT RATIO: 8 — AB (ref 9–20)
BUN: 7 mg/dL (ref 6–20)
Bilirubin Total: 1.2 mg/dL (ref 0.0–1.2)
CO2: 23 mmol/L (ref 20–29)
CREATININE: 0.93 mg/dL (ref 0.76–1.27)
Calcium: 9 mg/dL (ref 8.7–10.2)
Chloride: 100 mmol/L (ref 96–106)
GFR calc Af Amer: 130 mL/min/{1.73_m2} (ref >59)
GFR calc non Af Amer: 112 mL/min/{1.73_m2} (ref >59)
GLUCOSE: 92 mg/dL (ref 65–99)
Globulin, Total: 3 g/dL (ref 1.5–4.5)
Potassium: 3.8 mmol/L (ref 3.5–5.2)
Sodium: 138 mmol/L (ref 134–144)
TOTAL PROTEIN: 7.5 g/dL (ref 6.0–8.5)

## 2017-10-19 LAB — CBC WITH DIFFERENTIAL/PLATELET
BASOS ABS: 0 10*3/uL (ref 0.0–0.2)
Basos: 0 %
EOS (ABSOLUTE): 0.1 10*3/uL (ref 0.0–0.4)
Eos: 1 %
HEMOGLOBIN: 13.3 g/dL (ref 13.0–17.7)
Hematocrit: 40 % (ref 37.5–51.0)
IMMATURE GRANS (ABS): 0.1 10*3/uL (ref 0.0–0.1)
Immature Granulocytes: 1 %
LYMPHS: 21 %
Lymphocytes Absolute: 2.2 10*3/uL (ref 0.7–3.1)
MCH: 27.4 pg (ref 26.6–33.0)
MCHC: 33.3 g/dL (ref 31.5–35.7)
MCV: 83 fL (ref 79–97)
MONOCYTES: 9 %
Monocytes Absolute: 1 10*3/uL — ABNORMAL HIGH (ref 0.1–0.9)
NEUTROS PCT: 68 %
Neutrophils Absolute: 7.3 10*3/uL — ABNORMAL HIGH (ref 1.4–7.0)
PLATELETS: 140 10*3/uL — AB (ref 150–379)
RBC: 4.85 x10E6/uL (ref 4.14–5.80)
RDW: 16.3 % — ABNORMAL HIGH (ref 12.3–15.4)
WBC: 10.7 10*3/uL (ref 3.4–10.8)

## 2017-10-19 LAB — RETICULOCYTES: Retic Ct Pct: 5.8 % — ABNORMAL HIGH (ref 0.6–2.6)

## 2017-10-20 NOTE — Telephone Encounter (Signed)
Note not needed 

## 2017-10-24 ENCOUNTER — Telehealth: Payer: Self-pay | Admitting: Family Medicine

## 2017-10-24 NOTE — Telephone Encounter (Signed)
Fax FMLA application along with all office notes including office visit with hematologist on in January.

## 2017-10-24 NOTE — Telephone Encounter (Signed)
Information faxed

## 2017-10-28 ENCOUNTER — Ambulatory Visit: Payer: 59 | Admitting: Family Medicine

## 2017-12-15 ENCOUNTER — Inpatient Hospital Stay (HOSPITAL_COMMUNITY)
Admission: EM | Admit: 2017-12-15 | Discharge: 2017-12-16 | DRG: 812 | Disposition: A | Discharge status: Home / Self Care | Payer: 59 | Attending: Internal Medicine | Admitting: Internal Medicine

## 2017-12-15 ENCOUNTER — Emergency Department (HOSPITAL_COMMUNITY): Payer: 59

## 2017-12-15 ENCOUNTER — Other Ambulatory Visit: Payer: Self-pay

## 2017-12-15 ENCOUNTER — Encounter (HOSPITAL_COMMUNITY): Payer: Self-pay

## 2017-12-15 DIAGNOSIS — D6959 Other secondary thrombocytopenia: Secondary | ICD-10-CM | POA: Diagnosis present

## 2017-12-15 DIAGNOSIS — D696 Thrombocytopenia, unspecified: Secondary | ICD-10-CM | POA: Diagnosis not present

## 2017-12-15 DIAGNOSIS — D5700 Hb-SS disease with crisis, unspecified: Principal | ICD-10-CM | POA: Diagnosis present

## 2017-12-15 DIAGNOSIS — F17290 Nicotine dependence, other tobacco product, uncomplicated: Secondary | ICD-10-CM | POA: Diagnosis present

## 2017-12-15 DIAGNOSIS — R161 Splenomegaly, not elsewhere classified: Secondary | ICD-10-CM | POA: Diagnosis not present

## 2017-12-15 DIAGNOSIS — R109 Unspecified abdominal pain: Secondary | ICD-10-CM | POA: Diagnosis not present

## 2017-12-15 LAB — COMPREHENSIVE METABOLIC PANEL
ALT: 14 U/L — ABNORMAL LOW (ref 17–63)
AST: 8 U/L — ABNORMAL LOW (ref 15–41)
Albumin: 4.7 g/dL (ref 3.5–5.0)
Alkaline Phosphatase: 49 U/L (ref 38–126)
Anion gap: 11 (ref 5–15)
BUN: 10 mg/dL (ref 6–20)
CO2: 24 mmol/L (ref 22–32)
Calcium: 9.4 mg/dL (ref 8.9–10.3)
Chloride: 105 mmol/L (ref 101–111)
Creatinine, Ser: 0.82 mg/dL (ref 0.61–1.24)
GFR calc Af Amer: >60 mL/min (ref >60)
GFR calc non Af Amer: >60 mL/min (ref >60)
Glucose, Bld: 99 mg/dL (ref 65–99)
Potassium: 3.6 mmol/L (ref 3.5–5.1)
Sodium: 140 mmol/L (ref 135–145)
Total Bilirubin: 1.6 mg/dL — ABNORMAL HIGH (ref 0.3–1.2)
Total Protein: 8.1 g/dL (ref 6.5–8.1)

## 2017-12-15 LAB — URINALYSIS, ROUTINE W REFLEX MICROSCOPIC
Bilirubin Urine: NEGATIVE
HGB URINE DIPSTICK: NEGATIVE
Ketones, ur: NEGATIVE mg/dL
Leukocytes, UA: NEGATIVE
Nitrite: NEGATIVE
Protein, ur: NEGATIVE mg/dL
SPECIFIC GRAVITY, URINE: 1.018 (ref 1.005–1.030)
pH: 6 (ref 5.0–8.0)

## 2017-12-15 LAB — RETICULOCYTES
RBC.: 4.53 MIL/uL (ref 4.22–5.81)
RETIC COUNT ABSOLUTE: 163.1 10*3/uL (ref 19.0–186.0)
RETIC CT PCT: 3.6 % — AB (ref 0.4–3.1)

## 2017-12-15 LAB — CBC WITH DIFFERENTIAL/PLATELET
BASOS ABS: 0 10*3/uL (ref 0.0–0.1)
Basophils Relative: 0 %
EOS PCT: 2 %
Eosinophils Absolute: 0.2 10*3/uL (ref 0.0–0.7)
HCT: 34.8 % — ABNORMAL LOW (ref 39.0–52.0)
Hemoglobin: 12.3 g/dL — ABNORMAL LOW (ref 13.0–17.0)
LYMPHS PCT: 46 %
Lymphs Abs: 3.6 10*3/uL (ref 0.7–4.0)
MCH: 27.2 pg (ref 26.0–34.0)
MCHC: 35.3 g/dL (ref 30.0–36.0)
MCV: 76.8 fL — AB (ref 78.0–100.0)
MONO ABS: 0.8 10*3/uL (ref 0.1–1.0)
Monocytes Relative: 10 %
Neutro Abs: 3.4 10*3/uL (ref 1.7–7.7)
Neutrophils Relative %: 42 %
PLATELETS: 124 10*3/uL — AB (ref 150–400)
RBC: 4.53 MIL/uL (ref 4.22–5.81)
RDW: 14.7 % (ref 11.5–15.5)
WBC: 7.9 10*3/uL (ref 4.0–10.5)

## 2017-12-15 MED ORDER — HYDROMORPHONE 1 MG/ML IV SOLN
INTRAVENOUS | Status: DC
  Administered 2017-12-15: 14:00:00 via INTRAVENOUS
  Administered 2017-12-15: 1 mg via INTRAVENOUS
  Administered 2017-12-16: 1.5 mg via INTRAVENOUS
  Administered 2017-12-16: 1 mg via INTRAVENOUS
  Administered 2017-12-16: 0 mg via INTRAVENOUS
  Filled 2017-12-15: qty 25

## 2017-12-15 MED ORDER — HYDROMORPHONE HCL 2 MG/ML IJ SOLN
2.0000 mg | INTRAMUSCULAR | Status: AC
  Administered 2017-12-15: 2 mg via INTRAVENOUS
  Filled 2017-12-15: qty 1

## 2017-12-15 MED ORDER — POLYETHYLENE GLYCOL 3350 17 G PO PACK: 17.0000 g | PACK | Freq: Every day | ORAL | Status: DC | PRN

## 2017-12-15 MED ORDER — SODIUM CHLORIDE 0.9% FLUSH: 9.0000 mL | INTRAVENOUS | Status: DC | PRN

## 2017-12-15 MED ORDER — HYDROMORPHONE HCL 2 MG/ML IJ SOLN: 2.0000 mg | INTRAMUSCULAR | Status: AC

## 2017-12-15 MED ORDER — HYDROMORPHONE HCL 1 MG/ML IJ SOLN: 1.0000 mg | Freq: Once | INTRAMUSCULAR | Status: DC

## 2017-12-15 MED ORDER — DEXTROSE 5 % AND 0.45 % NACL IV BOLUS
1000.0000 mL | Freq: Once | INTRAVENOUS | Status: AC
  Administered 2017-12-15: 1000 mL via INTRAVENOUS

## 2017-12-15 MED ORDER — ENOXAPARIN SODIUM 40 MG/0.4ML ~~LOC~~ SOLN
40.0000 mg | SUBCUTANEOUS | Status: DC
  Filled 2017-12-15: qty 0.4

## 2017-12-15 MED ORDER — SENNOSIDES-DOCUSATE SODIUM 8.6-50 MG PO TABS
1.0000 | ORAL_TABLET | Freq: Two times a day (BID) | ORAL | Status: DC
  Administered 2017-12-16: 1 via ORAL
  Filled 2017-12-15 (×2): qty 1

## 2017-12-15 MED ORDER — DEXTROSE-NACL 5-0.45 % IV SOLN
INTRAVENOUS | Status: DC
  Administered 2017-12-15 – 2017-12-16 (×3): via INTRAVENOUS

## 2017-12-15 MED ORDER — NALOXONE HCL 0.4 MG/ML IJ SOLN: 0.4000 mg | INTRAMUSCULAR | Status: DC | PRN

## 2017-12-15 MED ORDER — HYDROMORPHONE HCL 2 MG/ML IJ SOLN
2.0000 mg | INTRAMUSCULAR | Status: AC
  Administered 2017-12-15: 2 mg via INTRAVENOUS

## 2017-12-15 MED ORDER — ACETAMINOPHEN 500 MG PO TABS
1000.0000 mg | ORAL_TABLET | Freq: Once | ORAL | Status: AC
  Administered 2017-12-15: 1000 mg via ORAL
  Filled 2017-12-15: qty 2

## 2017-12-15 MED ORDER — HYDROMORPHONE HCL 2 MG/ML IJ SOLN
2.0000 mg | INTRAMUSCULAR | Status: AC
  Administered 2017-12-15 (×2): 2 mg via INTRAVENOUS
  Filled 2017-12-15 (×3): qty 1

## 2017-12-15 MED ORDER — HYDROMORPHONE HCL 1 MG/ML IJ SOLN
1.0000 mg | Freq: Once | INTRAMUSCULAR | Status: AC
  Administered 2017-12-15: 1 mg via INTRAVENOUS
  Filled 2017-12-15: qty 1

## 2017-12-15 MED ORDER — ONDANSETRON HCL 4 MG/2ML IJ SOLN
4.0000 mg | Freq: Four times a day (QID) | INTRAMUSCULAR | Status: DC | PRN
  Administered 2017-12-15: 4 mg via INTRAVENOUS

## 2017-12-15 MED ORDER — DIPHENHYDRAMINE HCL 12.5 MG/5ML PO ELIX: 12.5000 mg | ORAL_SOLUTION | Freq: Four times a day (QID) | ORAL | Status: DC | PRN

## 2017-12-15 MED ORDER — ONDANSETRON HCL 4 MG/2ML IJ SOLN
4.0000 mg | INTRAMUSCULAR | Status: DC | PRN
  Administered 2017-12-15: 4 mg via INTRAVENOUS
  Filled 2017-12-15 (×2): qty 2

## 2017-12-15 MED ORDER — KETOROLAC TROMETHAMINE 30 MG/ML IJ SOLN
30.0000 mg | Freq: Four times a day (QID) | INTRAMUSCULAR | Status: DC
  Administered 2017-12-15 – 2017-12-16 (×3): 30 mg via INTRAVENOUS
  Filled 2017-12-15 (×3): qty 1

## 2017-12-15 MED ORDER — DIPHENHYDRAMINE HCL 50 MG/ML IJ SOLN
25.0000 mg | Freq: Once | INTRAMUSCULAR | Status: AC
  Administered 2017-12-15: 25 mg via INTRAVENOUS
  Filled 2017-12-15: qty 1

## 2017-12-15 MED ORDER — KETOROLAC TROMETHAMINE 30 MG/ML IJ SOLN
30.0000 mg | INTRAMUSCULAR | Status: AC
  Administered 2017-12-15: 30 mg via INTRAVENOUS
  Filled 2017-12-15: qty 1

## 2017-12-15 MED ORDER — HYDROMORPHONE HCL 2 MG/ML IJ SOLN
2.0000 mg | INTRAMUSCULAR | Status: AC
  Filled 2017-12-15: qty 1

## 2017-12-15 MED ORDER — DIPHENHYDRAMINE HCL 50 MG/ML IJ SOLN: 12.5000 mg | Freq: Four times a day (QID) | INTRAMUSCULAR | Status: DC | PRN

## 2017-12-15 NOTE — Progress Notes (Signed)
6E RN called ED for report,RN not available.

## 2017-12-15 NOTE — ED Notes (Signed)
Pt verbalizes does not want something for pain at present and will alert staff if would like something for pain.

## 2017-12-15 NOTE — ED Notes (Signed)
Saline crackers, peanut butter, and apple juice given to pt.

## 2017-12-15 NOTE — ED Triage Notes (Signed)
Sickle cell pain since yesterday states it is his typical pain.

## 2017-12-15 NOTE — H&P (Signed)
Austin [name] is an 27 y.o. male.    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 Patient has had recent ultrasound indicating Splenectomy. Has been seen by Oncologist-Dr Grace Isaac. Has had good hemoglobin. Patient took his Tylenol #3 as well as MSIR but no relief. He was seen in ER and given up to 6 doses of IV Dilaudid but pain has persisted. CT abdomen showed persistent Splenomegaly with some pressure on left kidney but no hydronephrosis. Patient is being admitted for sickle cell crisis.  Past Medical History:  Diagnosis Date  . Eczema   . Sickle cell trait ([employer])     History reviewed. No pertinent surgical history.  Family History  Problem Relation Age of Onset  . Sickle cell trait Mother   . Diabetes Father   . Hypertension Father    Social History:  reports that he has been smoking cigars.  He has been smoking about 0.10 packs per day. He has never used smokeless tobacco. He reports that he drinks alcohol. He reports that he has current or past drug history. Drug: Marijuana.  Allergies: No Known Allergies   (Not in a hospital admission)  Results for orders placed or performed during the hospital encounter of 12/15/17 (from the past 48 hour(s))  Comprehensive metabolic panel     Status: Abnormal   Collection Time: 12/15/17  4:49 AM  Result Value Ref Range   Sodium 140 135 - 145 mmol/L   Potassium 3.6 3.5 - 5.1 mmol/L   Chloride 105 101 - 111 mmol/L   CO2 24 22 - 32 mmol/L   Glucose, Bld 99 65 - 99 mg/dL   BUN 10 6 - 20 mg/dL   Creatinine, Ser 0.82 0.61 - 1.24 mg/dL   Calcium 9.4 8.9 - 10.3 mg/dL   Total Protein 8.1 6.5 - 8.1 g/dL   Albumin 4.7 3.5 - 5.0 g/dL   AST 8 (L) 15 - 41 U/L   ALT 14 (L) 17 - 63 U/L   Alkaline Phosphatase 49 38 - 126 U/L   Total Bilirubin 1.6 (H) 0.3 - 1.2 mg/dL   GFR calc non Af  Amer >60 >60 mL/min   GFR calc Af Amer >60 >60 mL/min    Comment: (NOTE) The eGFR has been calculated using the CKD EPI equation. This calculation has not been validated in all clinical situations. eGFR's persistently <60 mL/min signify possible Chronic Kidney Disease.    Anion gap 11 5 - 15    Comment: Performed at [facility], [place] [address], [place], [place] [address]  CBC with Differential     Status: Abnormal   Collection Time: 12/15/17  4:49 AM  Result Value Ref Range   WBC 7.9 4.0 - 10.5 K/uL   RBC 4.53 4.22 - 5.81 MIL/uL   Hemoglobin 12.3 (L) 13.0 - 17.0 g/dL   HCT 34.8 (L) 39.0 - 52.0 %   MCV 76.8 (L) 78.0 - 100.0 fL   MCH 27.2 26.0 - 34.0 pg   MCHC 35.3 30.0 - 36.0 g/dL   RDW 14.7 11.5 - 15.5 %   Platelets 124 (L) 150 - 400 K/uL   Neutrophils Relative % 42 %   Neutro Abs 3.4 1.7 - 7.7 K/uL   Lymphocytes Relative 46 %   Lymphs Abs 3.6 0.7 - 4.0 K/uL  Monocytes Relative 10 %   Monocytes Absolute 0.8 0.1 - 1.0 K/uL   Eosinophils Relative 2 %   Eosinophils Absolute 0.2 0.0 - 0.7 K/uL   Basophils Relative 0 %   Basophils Absolute 0.0 0.0 - 0.1 K/uL    Comment: Performed at [facility], [place] [address] [place], [place] [address]  Reticulocytes     Status: Abnormal   Collection Time: 12/15/17  4:49 AM  Result Value Ref Range   Retic Ct Pct 3.6 (H) 0.4 - 3.1 %   RBC. 4.53 4.22 - 5.81 MIL/uL   Retic Count, Absolute 163.1 19.0 - 186.0 K/uL    Comment: Performed at [facility], [place] [address], [place], [place] [address]  Urinalysis, Routine w reflex microscopic     Status: Abnormal   Collection Time: 12/15/17  7:53 AM  Result Value Ref Range   Color, Urine YELLOW YELLOW   APPearance CLEAR CLEAR   Specific Gravity, Urine 1.018 1.005 - 1.030   pH 6.0 5.0 - 8.0   Glucose, UA >=500 (A) NEGATIVE mg/dL   Hgb urine dipstick NEGATIVE NEGATIVE   Bilirubin Urine NEGATIVE NEGATIVE   Ketones, ur NEGATIVE NEGATIVE mg/dL    Protein, ur NEGATIVE NEGATIVE mg/dL   Nitrite NEGATIVE NEGATIVE   Leukocytes, UA NEGATIVE NEGATIVE   RBC / HPF 0-5 0 - 5 RBC/hpf   WBC, UA 0-5 0 - 5 WBC/hpf   Bacteria, UA RARE (A) NONE SEEN   Mucus PRESENT     Comment: Performed at [facility], [place] [address], [place], [place] [address]   Ct Renal Stone Study  Result Date: 12/15/2017 CLINICAL DATA:  Left flank pain. History of splenomegaly. 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 COMPARISON:  July 16, 2017 FINDINGS: Lower chest: There is atelectatic change and scarring in the right lung base region, stable. Lung bases otherwise appear unremarkable. Hepatobiliary: No focal liver lesions are evident on this noncontrast enhanced study. Gallbladder wall is not appreciably thickened. No biliary duct dilatation. Pancreas: No pancreatic mass or inflammatory focus. Spleen: Spleen measures 16.5 x 16.3 x 8.2 cm in size with a measured volume of 1,103 cubic cm. No focal splenic lesions are evident on this noncontrast enhanced study. Adrenals/Urinary Tract: Adrenals appear unremarkable bilaterally. The spleen compresses the anterior and lateral aspects of the left kidney. There is no renal mass or hydronephrosis on either side. No renal or ureteral calculus is evident on either side. Urinary bladder is midline with wall thickness slightly increased. Stomach/Bowel: There is no appreciable bowel wall or mesenteric thickening. No evident bowel obstruction. No free air or portal venous air. Vascular/Lymphatic: No abdominal aortic aneurysm. No vascular lesions are evident on this noncontrast enhanced study. No adenopathy by size criteria is evident in the abdomen or pelvis. There are benign-appearing inguinal lymph nodes which contain fatty hila. There  are scattered mesenteric lymph nodes, subcentimeter in size. These lymph nodes appear similar to prior study. Reproductive: Prostate and seminal vesicles are normal in size and contour. No evident pelvic mass. Other: Appendix appears normal. No abscess or ascites is evident in the abdomen or pelvis. Musculoskeletal: There are no appreciable blastic or lytic bone lesions. There is no intramuscular or abdominal wall lesion evident. IMPRESSION: 1. Splenomegaly, with spleen slightly larger than on prior study. No focal  splenic lesions are evident on this noncontrast enhanced study. Note that the spleen compresses the left kidney anteriorly and laterally. This splenomegaly with impression upon the left kidney may well represent source of left flank region pain. 2.  No renal or ureteral calculus.  No hydronephrosis. 3. Slight urinary bladder wall thickening. Question a degree of cystitis. Urinalysis advised in this regard. 4.  Stable scarring and atelectasis posterior right lung base. 5. Stable subcentimeter mesenteric lymph nodes, regarded as nonspecific. Electronically Signed   By: Lowella Grip III M.D.   On: 12/15/2017 09:00    Review of Systems  Constitutional: Negative.   HENT: Negative.   Eyes: Negative.   Respiratory: Negative.   Cardiovascular: Negative.   Gastrointestinal: Negative.   Genitourinary: Negative.   Musculoskeletal: Positive for back pain and myalgias.  Skin: Negative.   Neurological: Negative.   Endo/Heme/Allergies: Negative.   Psychiatric/Behavioral: Negative.     Blood pressure 107/76, pulse 84, temperature 98.4 F (36.9 C), temperature source Oral, resp. rate (!) 23, height 5' 7" (1.702 m), weight 74.8 kg (165 lb), SpO2 97 %. Physical Exam  Constitutional: He is oriented to person, place, and time. He appears well-developed and well-nourished.  HENT:  Head: Normocephalic and atraumatic.  Eyes: Pupils are equal, round, and reactive to light. Conjunctivae are normal.   Neck: Normal range of motion. Neck supple.  Cardiovascular: Normal rate, regular rhythm and normal heart sounds.  Respiratory: Effort normal and breath sounds normal.  GI: Soft. Bowel sounds are normal. There is tenderness.  Musculoskeletal: Normal range of motion. He exhibits edema.  Neurological: He is alert and oriented to person, place, and time.  Skin: Skin is warm and dry.  Psychiatric: He has a normal mood and affect.     Assessment/Plan A 27 yo admitted with sickle cell painful crisis.  1. Sickle cell CC disease with crisis: Patient will be admitted to the floor. Initiate IV Dilaudid PCA with Toradol and IVF. Will monitor H/H as well as pain level.  2. Splenomegaly: Related to Sickle cell disease. Will continue to monitor  3. Thrombocytopenia: Probably due to consumption from splenomegaly.  Barbette Merino, MD 12/15/2017, 10:30 AM

## 2017-12-15 NOTE — ED Provider Notes (Signed)
[facility]-EMERGENCY DEPT Provider Note   CSN: [id number] Arrival date & time: 12/15/17  0417     History   Chief Complaint Chief Complaint  Patient presents with  . Sickle Cell Pain Crisis    HPI Austin [name] is a 27 y.o. male here for evaluation of "sickle cell crisis".  Pain is 9/10.  Onset yesterday.  Has been gradually worsening.  Described as constant, achy.  Pain mostly to the left low and thoracic back and radiates to the left flank/LUQ.  He is concerned because he was told his spleen was becoming enlarged.  Patient recently diagnosed with sickle cell anemia earlier this year, states he was always aware he had sickle cell trait but has recently had worsening diffuse body pain and his PCP sent him to oncologist to confirm diagnosis.  Patient has prescriptions for morphine and Tylenol/codeine that he takes at home.  Last time he took Tylenol/codeine was 3 days ago, morphine 2 weeks ago.  States last night he was taking care of his young son and she was unable to take his home pain medicines because it makes him too somnolent.  He has no fevers, headache, neck pain, chest pain, shortness of breath, cough, nausea/vomiting, abdominal pain, changes in bowel movements, dysuria, hematuria, saddle anesthesia, numbness or weakness in extremities.  Several weeks ago he was told that his spleen was enlarged and his bilirubin was elevated.  HPI  Past Medical History:  Diagnosis Date  . Eczema   . Sickle cell trait [facility])     Patient Active Problem List   Diagnosis Date Noted  . 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 Spleen enlarged 08/18/2017    History reviewed. No pertinent surgical history.      Home Medications    Prior to Admission medications   Medication Sig Start Date End Date Taking? Authorizing Provider  acetaminophen-codeine (TYLENOL #3) 300-30 MG tablet Take 1 tablet by mouth every 6 (six) hours as needed for moderate pain.   Yes  [provider]  morphine (MSIR) 15 MG tablet Take 1 tablet (15 mg total) by mouth every 4 (four) hours as needed for severe pain. 08/22/17  Yes Perlov, Jene Every, MD  ondansetron (ZOFRAN) 4 MG tablet Take 1 tablet (4 mg total) by mouth every 8 (eight) hours as needed for nausea or vomiting. Patient not taking: Reported on 10/18/2017 08/22/17   Daisy Blossom, MD    Family History Family History  Problem Relation Age of Onset  . Sickle cell trait Mother   . Diabetes Father   . Hypertension Father     Social History Social History   Tobacco Use  . Smoking status: Current Some Day Smoker    Packs/day: 0.10    Types: Cigars  . Smokeless tobacco: Never Used  Substance Use Topics  . Alcohol use: Yes  . Drug use: Yes    Types: Marijuana     Allergies   Patient has no known allergies.   Review of Systems Review of Systems  Gastrointestinal: Positive for abdominal pain.  Genitourinary: Positive for flank pain.  Musculoskeletal: Positive for back pain.  Allergic/Immunologic: Positive for immunocompromised state (sickle cell anemia).  All other systems reviewed and are negative.    Physical Exam Updated Vital Signs BP (!) 106/43   Pulse 66   Temp 98.4 F (36.9 C) (Oral)   Resp 13   Ht  (1.702 m)   Wt 74.8 kg (165 lb)  SpO2 100%   BMI 25.84 kg/m   Physical Exam  Constitutional: Vital signs are normal. He appears well-developed.  Non-toxic appearance.  Looks uncomfortable  HENT:  Moist mucous membranes   Eyes:  No scleral incterus  Neck: Full passive range of motion without pain. No neck rigidity.  No cervical adenopathy.  c-spine: no midline tenderness, full AROM of neck without pain.   Cardiovascular: Normal rate, regular rhythm, S1 normal, S2 normal and normal heart sounds.  No murmur heard. Pulses:      Carotid pulses are 2+ on the right side, and 2+ on the left side.      Radial pulses are 2+ on the right side, and 2+ on the left side.        Dorsalis pedis pulses are 2+ on the right side, and 2+ on the left side.  No asymmetric LE edema  2+ radial and DP pulses bilaterally   Pulmonary/Chest: Effort normal. He has no decreased breath sounds. He has no wheezes. He has no rhonchi. He has no rales. He exhibits no tenderness.  Abdominal: Soft. Normal appearance. There is tenderness. There is no CVA tenderness.  LUQ and epigastric tenderness. L CVAT. No distention. NO G/R/R. No suprapubic tenderness. BS active.   Musculoskeletal: Normal range of motion. He exhibits tenderness.  TL spine: diffuse midline and left sided paraspinal muscle tenderness. L CVAT. Negative SLR. No overlaying rash.   Neurological: He is alert.  Skin: Skin is warm and dry. Capillary refill takes less than 2 seconds.  Psychiatric: He has a normal mood and affect. His speech is normal and behavior is normal. Judgment and thought content normal. Cognition and memory are normal.     ED Treatments / Results  Labs (all labs ordered are listed, but only abnormal results are displayed) Labs Reviewed  COMPREHENSIVE METABOLIC PANEL - Abnormal; Notable for the following components:      Result Value   AST 8 (*)    ALT 14 (*)    Total Bilirubin 1.6 (*)    All other components within normal limits  CBC WITH DIFFERENTIAL/PLATELET - Abnormal; Notable for the following components:   Hemoglobin 12.3 (*)    HCT 34.8 (*)    MCV 76.8 (*)    Platelets 124 (*)    All other components within normal limits  RETICULOCYTES - Abnormal; Notable for the following components:   Retic Ct Pct 3.6 (*)    All other components within normal limits  URINALYSIS, ROUTINE W REFLEX MICROSCOPIC - Abnormal; Notable for the following components:   Glucose, UA >=500 (*)    Bacteria, UA RARE (*)    All other components within normal limits    EKG None  Radiology Ct Renal Stone Study  Result Date: 12/15/2017 CLINICAL DATA:  Left flank pain. History of splenomegaly. History of  hemoglobin C disease and sickle cell trait EXAM: CT ABDOMEN AND PELVIS WITHOUT CONTRAST TECHNIQUE: Multidetector CT imaging of the abdomen and pelvis was performed following the standard protocol without oral or IV contrast. COMPARISON:  July 16, 2017 FINDINGS: Lower chest: There is atelectatic change and scarring in the right lung base region, stable. Lung bases otherwise appear unremarkable. Hepatobiliary: No focal liver lesions are evident on this noncontrast enhanced study. Gallbladder wall is not appreciably thickened. No biliary duct dilatation. Pancreas: No pancreatic mass or inflammatory focus. Spleen: Spleen measures 16.5 x 16.3 x 8.2 cm in size with a measured volume of 1,103 cubic cm. No focal splenic lesions are  evident on this noncontrast enhanced study. Adrenals/Urinary Tract: Adrenals appear unremarkable bilaterally. The spleen compresses the anterior and lateral aspects of the left kidney. There is no renal mass or hydronephrosis on either side. No renal or ureteral calculus is evident on either side. Urinary bladder is midline with wall thickness slightly increased. Stomach/Bowel: There is no appreciable bowel wall or mesenteric thickening. No evident bowel obstruction. No free air or portal venous air. Vascular/Lymphatic: No abdominal aortic aneurysm. No vascular lesions are evident on this noncontrast enhanced study. No adenopathy by size criteria is evident in the abdomen or pelvis. There are benign-appearing inguinal lymph nodes which contain fatty hila. There are scattered mesenteric lymph nodes, subcentimeter in size. These lymph nodes appear similar to prior study. Reproductive: Prostate and seminal vesicles are normal in size and contour. No evident pelvic mass. Other: Appendix appears normal. No abscess or ascites is evident in the abdomen or pelvis. Musculoskeletal: There are no appreciable blastic or lytic bone lesions. There is no intramuscular or abdominal wall lesion evident.  IMPRESSION: 1. Splenomegaly, with spleen slightly larger than on prior study. No focal splenic lesions are evident on this noncontrast enhanced study. Note that the spleen compresses the left kidney anteriorly and laterally. This splenomegaly with impression upon the left kidney may well represent source of left flank region pain. 2.  No renal or ureteral calculus.  No hydronephrosis. 3. Slight urinary bladder wall thickening. Question a degree of cystitis. Urinalysis advised in this regard. 4.  Stable scarring and atelectasis posterior right lung base. 5. Stable subcentimeter mesenteric lymph nodes, regarded as nonspecific. Electronically Signed   By: Bretta Bang III M.D.   On: 12/15/2017 09:00    Procedures Procedures (including critical care time)  Medications Ordered in ED Medications  HYDROmorphone (DILAUDID) injection 2 mg (2 mg Intravenous Given 12/15/17 0943)    Or  HYDROmorphone (DILAUDID) injection 2 mg ( Subcutaneous See Alternative 12/15/17 0943)  ondansetron (ZOFRAN) injection 4 mg (4 mg Intravenous Given 12/15/17 0750)  HYDROmorphone (DILAUDID) injection 1 mg (1 mg Intravenous Given 12/15/17 0602)  ketorolac (TORADOL) 30 MG/ML injection 30 mg (30 mg Intravenous Given 12/15/17 0741)  HYDROmorphone (DILAUDID) injection 2 mg (2 mg Intravenous Given 12/15/17 0741)    Or  HYDROmorphone (DILAUDID) injection 2 mg ( Subcutaneous See Alternative 12/15/17 0741)  HYDROmorphone (DILAUDID) injection 2 mg (2 mg Intravenous Given 12/15/17 0841)    Or  HYDROmorphone (DILAUDID) injection 2 mg ( Subcutaneous See Alternative 12/15/17 0841)  HYDROmorphone (DILAUDID) injection 2 mg (2 mg Intravenous Given 12/15/17 0707)    Or  HYDROmorphone (DILAUDID) injection 2 mg ( Subcutaneous See Alternative 12/15/17 0707)  diphenhydrAMINE (BENADRYL) injection 25 mg (25 mg Intravenous Given 12/15/17 0707)  acetaminophen (TYLENOL) tablet 1,000 mg (1,000 mg Oral Given 12/15/17 0707)  dextrose 5 % and 0.45% NaCl 5-0.45  % bolus 1,000 mL (0 mLs Intravenous Stopped 12/15/17 0808)     Initial Impression / Assessment and Plan / ED Course  I have reviewed the triage vital signs and the nursing notes.  Pertinent labs & imaging results that were available during my care of the patient were reviewed by me and considered in my medical decision making (see chart for details).  Clinical Course as of Dec 15 1009  Thu Dec 15, 2017  0642 Hemoglobin(!): 12.3 [CG]  0643 Total Bilirubin(!): 1.6 [CG]  0945 IMPRESSION: 1. Splenomegaly, with spleen slightly larger than on prior study. No focal splenic lesions are evident on this noncontrast enhanced study. Note  that the spleen compresses the left kidney anteriorly and laterally. This splenomegaly with impression upon the left kidney may well represent source of left flank region pain.  2. No renal or ureteral calculus. No hydronephrosis.  3. Slight urinary bladder wall thickening. Question a degree of cystitis. Urinalysis advised in this regard.  4. Stable scarring and atelectasis posterior right lung base.  5. Stable subcentimeter mesenteric lymph nodes, regarded as nonspecific.  CT Renal Soundra [place] [CG]    Clinical Course User Index [CG] Liberty Handy, PA-C    27 year old male with recently confirmed sickle cell anemia here for sickle cell pain to left TL spine.  Pain does wrap around his left flank and left upper quadrant.  He still has this pain.  Concern for pain crisis secondary to chemo lysis versus splenic sequestration.  I reviewed patient's previous laboratory studies, imaging including abdominal ultrasound/CT renal which showed marked splenomegaly with perisplenic varices.  On exam, he is HD stable. No urinary symptoms or h/o kidney stone.    He has diffuse TL muscular tenderness and L UQ tenderness.  No peritonitis. Given known splenomegaly pain may be secondary to splenic sequestration.   72: Lab work reviewed and remarkable for hgb 12.3  lower than previous and total bili 1.6, PLT 124. Retic count WNL. Pending UA and CT renal. SCD protocol initiated.   0945: Glucosuria but no signs of UTI. CT shows increased spleen size with compression on left kidney. No stones. Slight urinary bladder thickening questioning cystitis however he is asymptomatic. Pain has been refractory to three rounds of pain meds in ER.  Will request admission for further sickle cell pain crisis. Discussed plan with pt who is in agreement.  Final Clinical Impressions(s) / ED Diagnoses   1010: Spoke to Dr Mikeal Hawthorne who accepts patient. VSS at transfer of care.  Final diagnoses:  Sickle cell crisis (HCC)  Splenomegaly  Thrombocytopenia [facility])    ED Discharge Orders    None       Liberty Handy, PA-C 12/15/17 1011    Rolan Bucco, MD 12/15/17 1514

## 2017-12-15 NOTE — ED Notes (Signed)
Pt transported to CT ?

## 2017-12-15 NOTE — ED Notes (Signed)
Pt returned from CT °

## 2017-12-15 NOTE — ED Notes (Signed)
ED TO INPATIENT HANDOFF REPORT  Name/Age/Gender Austin [name] 27 y.o. male  Code Status   Home/SNF/Other Home  Chief Complaint Sickle Cell Pain Crisis  Level of Care/Admitting Diagnosis ED Disposition    ED Disposition Condition [facility] Area: [place] [100102]  Level of Care: Med-Surg [16]  Diagnosis: Sickle cell anemia with crisis [facility]) [[id number]]  Admitting Physician: Elwyn Reach [[id number]]  Attending Physician: Elwyn Reach [[id number]]  Estimated length of stay: past midnight tomorrow  Certification:: I certify this patient will need inpatient services for at least 2 midnights  PT Class (Do Not Modify): Inpatient [101]  PT Acc Code (Do Not Modify): Private [1]       Medical History Past Medical History:  Diagnosis Date  . Eczema   . Sickle cell trait ([place])     Allergies No Known Allergies  IV Location/Drains/Wounds Patient Lines/Drains/Airways Status   Active Line/Drains/Airways    Name:   Placement date:   Placement time:   Site:   Days:   Peripheral IV 12/15/17 Right Antecubital   12/15/17    0448    Antecubital   less than 1          Labs/Imaging Results for orders placed or performed during the hospital encounter of 12/15/17 (from the past 48 hour(s))  Comprehensive metabolic panel     Status: Abnormal   Collection Time: 12/15/17  4:49 AM  Result Value Ref Range   Sodium 140 135 - 145 mmol/L   Potassium 3.6 3.5 - 5.1 mmol/L   Chloride 105 101 - 111 mmol/L   CO2 24 22 - 32 mmol/L   Glucose, Bld 99 65 - 99 mg/dL   BUN 10 6 - 20 mg/dL   Creatinine, Ser 0.82 0.61 - 1.24 mg/dL   Calcium 9.4 8.9 - 10.3 mg/dL   Total Protein 8.1 6.5 - 8.1 g/dL   Albumin 4.7 3.5 - 5.0 g/dL   AST 8 (L) 15 - 41 U/L   ALT 14 (L) 17 - 63 U/L   Alkaline Phosphatase 49 38 - 126 U/L   Total Bilirubin 1.6 (H) 0.3 - 1.2 mg/dL   GFR calc non Af Amer >60 >60 mL/min   GFR calc Af Amer >60 >60 mL/min    Comment: (NOTE) The eGFR  has been calculated using the CKD EPI equation. This calculation has not been validated in all clinical situations. eGFR's persistently <60 mL/min signify possible Chronic Kidney Disease.    Anion gap 11 5 - 15    Comment: Performed at [facility], [place] [address] [place], [place] [address]  CBC with Differential     Status: Abnormal   Collection Time: 12/15/17  4:49 AM  Result Value Ref Range   WBC 7.9 4.0 - 10.5 K/uL   RBC 4.53 4.22 - 5.81 MIL/uL   Hemoglobin 12.3 (L) 13.0 - 17.0 g/dL   HCT 34.8 (L) 39.0 - 52.0 %   MCV 76.8 (L) 78.0 - 100.0 fL   MCH 27.2 26.0 - 34.0 pg   MCHC 35.3 30.0 - 36.0 g/dL   RDW 14.7 11.5 - 15.5 %   Platelets 124 (L) 150 - 400 K/uL   Neutrophils Relative % 42 %   Neutro Abs 3.4 1.7 - 7.7 K/uL   Lymphocytes Relative 46 %   Lymphs Abs 3.6 0.7 - 4.0 K/uL   Monocytes Relative 10 %   Monocytes Absolute 0.8 0.1 - 1.0 K/uL  Eosinophils Relative 2 %   Eosinophils Absolute 0.2 0.0 - 0.7 K/uL   Basophils Relative 0 %   Basophils Absolute 0.0 0.0 - 0.1 K/uL    Comment: Performed at [facility], [place] [address] [place], [place] [address]  Reticulocytes     Status: Abnormal   Collection Time: 12/15/17  4:49 AM  Result Value Ref Range   Retic Ct Pct 3.6 (H) 0.4 - 3.1 %   RBC. 4.53 4.22 - 5.81 MIL/uL   Retic Count, Absolute 163.1 19.0 - 186.0 K/uL    Comment: Performed at [facility], [place] [address], [place], [place] [address]  Urinalysis, Routine w reflex microscopic     Status: Abnormal   Collection Time: 12/15/17  7:53 AM  Result Value Ref Range   Color, Urine YELLOW YELLOW   APPearance CLEAR CLEAR   Specific Gravity, Urine 1.018 1.005 - 1.030   pH 6.0 5.0 - 8.0   Glucose, UA >=500 (A) NEGATIVE mg/dL   Hgb urine dipstick NEGATIVE NEGATIVE   Bilirubin Urine NEGATIVE NEGATIVE   Ketones, ur NEGATIVE NEGATIVE mg/dL   Protein, ur NEGATIVE NEGATIVE mg/dL   Nitrite NEGATIVE NEGATIVE   Leukocytes, UA  NEGATIVE NEGATIVE   RBC / HPF 0-5 0 - 5 RBC/hpf   WBC, UA 0-5 0 - 5 WBC/hpf   Bacteria, UA RARE (A) NONE SEEN   Mucus PRESENT     Comment: Performed at [facility], [place] [address] [place], [place] [address]   Ct Renal Stone Study  Result Date: 12/15/2017 CLINICAL DATA:  Left flank pain. History of splenomegaly. 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 COMPARISON:  July 16, 2017 FINDINGS: Lower chest: There is atelectatic change and scarring in the right lung base region, stable. Lung bases otherwise appear unremarkable. Hepatobiliary: No focal liver lesions are evident on this noncontrast enhanced study. Gallbladder wall is not appreciably thickened. No biliary duct dilatation. Pancreas: No pancreatic mass or inflammatory focus. Spleen: Spleen measures 16.5 x 16.3 x 8.2 cm in size with a measured volume of 1,103 cubic cm. No focal splenic lesions are evident on this noncontrast enhanced study. Adrenals/Urinary Tract: Adrenals appear unremarkable bilaterally. The spleen compresses the anterior and lateral aspects of the left kidney. There is no renal mass or hydronephrosis on either side. No renal or ureteral calculus is evident on either side. Urinary bladder is midline with wall thickness slightly increased. Stomach/Bowel: There is no appreciable bowel wall or mesenteric thickening. No evident bowel obstruction. No free air or portal venous air. Vascular/Lymphatic: No abdominal aortic aneurysm. No vascular lesions are evident on this noncontrast enhanced study. No adenopathy by size criteria is evident in the abdomen or pelvis. There are benign-appearing inguinal lymph nodes which contain fatty hila. There are scattered mesenteric lymph nodes, subcentimeter in size. These lymph nodes appear  similar to prior study. Reproductive: Prostate and seminal vesicles are normal in size and contour. No evident pelvic mass. Other: Appendix appears normal. No abscess or ascites is evident in the abdomen or pelvis. Musculoskeletal: There are no appreciable blastic or lytic bone lesions. There is no intramuscular or abdominal wall lesion evident. IMPRESSION: 1. Splenomegaly, with spleen slightly larger than on prior study. No focal splenic lesions are evident on this noncontrast enhanced study. Note that the spleen compresses the  left kidney anteriorly and laterally. This splenomegaly with impression upon the left kidney may well represent source of left flank region pain. 2.  No renal or ureteral calculus.  No hydronephrosis. 3. Slight urinary bladder wall thickening. Question a degree of cystitis. Urinalysis advised in this regard. 4.  Stable scarring and atelectasis posterior right lung base. 5. Stable subcentimeter mesenteric lymph nodes, regarded as nonspecific. Electronically Signed   By: Lowella Grip III M.D.   On: 12/15/2017 09:00    Pending Labs [employer] (From admission, onward)   Start     Ordered   Signed and Held  HIV antibody (Routine Testing)  Once,   R     Signed and Held   Signed and Held  CBC  (enoxaparin (LOVENOX)    CrCl >/= 30 ml/min)  Once,   R    Comments:  Baseline for enoxaparin therapy IF NOT ALREADY DRAWN.  Notify MD if PLT < 100 K.    Signed and Held   Signed and Held  Creatinine, serum  (enoxaparin (LOVENOX)    CrCl >/= 30 ml/min)  Once,   R    Comments:  Baseline for enoxaparin therapy IF NOT ALREADY DRAWN.    Signed and Held   Signed and Held  Creatinine, serum  (enoxaparin (LOVENOX)    CrCl >/= 30 ml/min)  Weekly,   R    Comments:  while on enoxaparin therapy    Signed and Held   Signed and Held  Comprehensive metabolic panel  Tomorrow morning,   R     Signed and Held   Signed and Held  CBC with Differential/Platelet  Tomorrow morning,   R     Signed  and Held      Vitals/Pain Today's Vitals   12/15/17 1130 12/15/17 1200 12/15/17 1230 12/15/17 1300  BP: 113/70 (!) 119/56 110/68 119/85  Pulse: 77 84 61 61  Resp: '12 11 12 '$ (!) 8  Temp:      TempSrc:      SpO2: 99% 100% 97% 100%  Weight:      Height:      PainSc:        Isolation Precautions No active isolations  Medications Medications  ondansetron (ZOFRAN) injection 4 mg (4 mg Intravenous Given 12/15/17 0750)  HYDROmorphone (DILAUDID) injection 1 mg (1 mg Intravenous Given 12/15/17 0602)  ketorolac (TORADOL) 30 MG/ML injection 30 mg (30 mg Intravenous Given 12/15/17 0741)  HYDROmorphone (DILAUDID) injection 2 mg (2 mg Intravenous Given 12/15/17 0741)    Or  HYDROmorphone (DILAUDID) injection 2 mg ( Subcutaneous See Alternative 12/15/17 0741)  HYDROmorphone (DILAUDID) injection 2 mg (2 mg Intravenous Given 12/15/17 0841)    Or  HYDROmorphone (DILAUDID) injection 2 mg ( Subcutaneous See Alternative 12/15/17 0841)  HYDROmorphone (DILAUDID) injection 2 mg (2 mg Intravenous Given 12/15/17 0707)    Or  HYDROmorphone (DILAUDID) injection 2 mg ( Subcutaneous See Alternative 12/15/17 0707)  HYDROmorphone (DILAUDID) injection 2 mg (2 mg Intravenous Given 12/15/17 1207)    Or  HYDROmorphone (DILAUDID) injection 2 mg ( Subcutaneous See Alternative 12/15/17 1207)  diphenhydrAMINE (BENADRYL) injection 25 mg (25 mg Intravenous Given 12/15/17 0707)  acetaminophen (TYLENOL) tablet 1,000 mg (1,000 mg Oral Given 12/15/17 0707)  dextrose 5 % and 0.45% NaCl 5-0.45 % bolus 1,000 mL (0 mLs Intravenous Stopped 12/15/17 0808)    Mobility walks

## 2017-12-16 LAB — CBC WITH DIFFERENTIAL/PLATELET
Basophils Absolute: 0 10*3/uL (ref 0.0–0.1)
Basophils Relative: 0 %
Eosinophils Absolute: 0.2 10*3/uL (ref 0.0–0.7)
Eosinophils Relative: 2 %
HEMATOCRIT: 35.9 % — AB (ref 39.0–52.0)
HEMOGLOBIN: 12.8 g/dL — AB (ref 13.0–17.0)
LYMPHS ABS: 4.1 10*3/uL — AB (ref 0.7–4.0)
Lymphocytes Relative: 46 %
MCH: 27.4 pg (ref 26.0–34.0)
MCHC: 35.7 g/dL (ref 30.0–36.0)
MCV: 76.7 fL — ABNORMAL LOW (ref 78.0–100.0)
MONOS PCT: 8 %
Monocytes Absolute: 0.8 10*3/uL (ref 0.1–1.0)
NEUTROS ABS: 4 10*3/uL (ref 1.7–7.7)
NEUTROS PCT: 44 %
Platelets: 114 10*3/uL — ABNORMAL LOW (ref 150–400)
RBC: 4.68 MIL/uL (ref 4.22–5.81)
RDW: 14.9 % (ref 11.5–15.5)
WBC: 9 10*3/uL (ref 4.0–10.5)

## 2017-12-16 LAB — COMPREHENSIVE METABOLIC PANEL
ALBUMIN: 4.3 g/dL (ref 3.5–5.0)
ALT: 15 U/L — AB (ref 17–63)
ANION GAP: 8 (ref 5–15)
AST: 9 U/L — ABNORMAL LOW (ref 15–41)
Alkaline Phosphatase: 45 U/L (ref 38–126)
BUN: 6 mg/dL (ref 6–20)
CHLORIDE: 107 mmol/L (ref 101–111)
CO2: 25 mmol/L (ref 22–32)
Calcium: 9.2 mg/dL (ref 8.9–10.3)
Creatinine, Ser: 0.84 mg/dL (ref 0.61–1.24)
GFR calc non Af Amer: >60 mL/min (ref >60)
Glucose, Bld: 106 mg/dL — ABNORMAL HIGH (ref 65–99)
Potassium: 4.1 mmol/L (ref 3.5–5.1)
SODIUM: 140 mmol/L (ref 135–145)
Total Bilirubin: 1.5 mg/dL — ABNORMAL HIGH (ref 0.3–1.2)
Total Protein: 7.5 g/dL (ref 6.5–8.1)

## 2017-12-16 LAB — HIV ANTIBODY (ROUTINE TESTING W REFLEX): HIV SCREEN 4TH GENERATION: NONREACTIVE

## 2017-12-16 NOTE — Discharge Summary (Signed)
Physician Discharge Summary  Patient ID: Austin [name] MRN: [id number] DOB/AGE: 1991-04-28 26 y.o.  Admit date: 12/15/2017 Discharge date: 12/16/2017  Admission Diagnoses:  Discharge Diagnoses:  Principal Problem:   Acute sickle cell crisis [facility]) Active Problems:   Thrombocytopenia (HCC)   Splenomegaly   Sickle cell anemia with crisis [facility])   Discharged Condition: good  Hospital Course: Patient is a 26 year old gentleman with sickle cell disease admitted with sickle cell painful crisis and splenomegaly. He was treated with IV Dilaudid PCA Toradol and IV fluids. Patient was originally admitted as inpatient but he rapidly improved. Pain was much better. He opted to be discharged home on home regimen. His pain went from 9 out of 10-3 out of 10. He will follow-up with PCP as well as oncology  Consults: None  Significant Diagnostic Studies: labs: Serial CBCs and CMP is where checked. All within normal limits  Treatments: IV hydration and analgesia: acetaminophen and Dilaudid  Discharge Exam: [name] pressure 127/83, pulse 72, temperature 97.9 F (36.6 C), temperature source Oral, resp. rate 14, height  (1.702 m), weight 74.8 kg (165 lb), SpO2 98 %. General appearance: alert, cooperative, appears stated age and no distress Neck: no adenopathy, no carotid bruit, no JVD, supple, symmetrical, trachea midline and thyroid not enlarged, symmetric, no tenderness/mass/nodules Cardio: regular rate and rhythm, S1, S2 normal, no murmur, click, rub or gallop GI: soft, non-tender; bowel sounds normal; no masses,  no organomegaly Extremities: extremities normal, atraumatic, no cyanosis or edema Pulses: 2+ and symmetric Neurologic: Grossly normal  Disposition: Discharge disposition: 01-Home or Self Care       Discharge Instructions    Diet - low sodium heart healthy   Complete by:  As directed    Increase activity slowly   Complete by:  As directed      Allergies as of 12/16/2017    No Known Allergies     Medication List    TAKE these medications   acetaminophen-codeine 300-30 MG tablet Commonly known as:  TYLENOL #3 Take 1 tablet by mouth every 6 (six) hours as needed for moderate pain.   morphine 15 MG tablet Commonly known as:  MSIR Take 1 tablet (15 mg total) by mouth every 4 (four) hours as needed for severe pain.   ondansetron 4 MG tablet Commonly known as:  ZOFRAN Take 1 tablet (4 mg total) by mouth every 8 (eight) hours as needed for nausea or vomiting.        SignedLonia [name] 12/16/2017, 10:03 AM

## 2017-12-20 ENCOUNTER — Encounter (HOSPITAL_COMMUNITY): Payer: Self-pay

## 2017-12-20 ENCOUNTER — Emergency Department (HOSPITAL_COMMUNITY)
Admission: EM | Admit: 2017-12-20 | Discharge: 2017-12-20 | Disposition: A | Discharge status: Home / Self Care | Payer: 59 | Attending: Emergency Medicine | Admitting: Emergency Medicine

## 2017-12-20 ENCOUNTER — Encounter: Payer: Self-pay | Admitting: Family Medicine

## 2017-12-20 ENCOUNTER — Ambulatory Visit: Payer: 59 | Admitting: Family Medicine

## 2017-12-20 ENCOUNTER — Ambulatory Visit (INDEPENDENT_AMBULATORY_CARE_PROVIDER_SITE_OTHER): Payer: 59 | Admitting: Family Medicine

## 2017-12-20 VITALS — BP 132/86 | HR 84 | Temp 98.3°F | Ht 67.0 in | Wt 166.0 lb

## 2017-12-20 DIAGNOSIS — Z09 Encounter for follow-up examination after completed treatment for conditions other than malignant neoplasm: Secondary | ICD-10-CM | POA: Diagnosis not present

## 2017-12-20 DIAGNOSIS — D57219 Sickle-cell/Hb-C disease with crisis, unspecified: Secondary | ICD-10-CM | POA: Diagnosis not present

## 2017-12-20 DIAGNOSIS — D5700 Hb-SS disease with crisis, unspecified: Secondary | ICD-10-CM | POA: Diagnosis not present

## 2017-12-20 DIAGNOSIS — D582 Other hemoglobinopathies: Secondary | ICD-10-CM

## 2017-12-20 DIAGNOSIS — Z79899 Other long term (current) drug therapy: Secondary | ICD-10-CM | POA: Insufficient documentation

## 2017-12-20 DIAGNOSIS — F17210 Nicotine dependence, cigarettes, uncomplicated: Secondary | ICD-10-CM | POA: Diagnosis not present

## 2017-12-20 DIAGNOSIS — R52 Pain, unspecified: Secondary | ICD-10-CM | POA: Diagnosis not present

## 2017-12-20 LAB — RETICULOCYTES
RBC.: 4.58 MIL/uL (ref 4.22–5.81)
RETIC COUNT ABSOLUTE: 265.6 10*3/uL — AB (ref 19.0–186.0)
Retic Ct Pct: 5.8 % — ABNORMAL HIGH (ref 0.4–3.1)

## 2017-12-20 LAB — CBC WITH DIFFERENTIAL/PLATELET
BASOS ABS: 0 10*3/uL (ref 0.0–0.1)
Basophils Relative: 0 %
Eosinophils Absolute: 0.2 10*3/uL (ref 0.0–0.7)
Eosinophils Relative: 2 %
HEMATOCRIT: 35.8 % — AB (ref 39.0–52.0)
HEMOGLOBIN: 12.3 g/dL — AB (ref 13.0–17.0)
LYMPHS PCT: 27 %
Lymphs Abs: 2.3 10*3/uL (ref 0.7–4.0)
MCH: 26.9 pg (ref 26.0–34.0)
MCHC: 34.4 g/dL (ref 30.0–36.0)
MCV: 78.2 fL (ref 78.0–100.0)
MONOS PCT: 6 %
Monocytes Absolute: 0.5 10*3/uL (ref 0.1–1.0)
Neutro Abs: 5.6 10*3/uL (ref 1.7–7.7)
Neutrophils Relative %: 65 %
Platelets: 123 10*3/uL — ABNORMAL LOW (ref 150–400)
RBC: 4.58 MIL/uL (ref 4.22–5.81)
RDW: 14.9 % (ref 11.5–15.5)
WBC: 8.6 10*3/uL (ref 4.0–10.5)

## 2017-12-20 LAB — POCT URINALYSIS DIP (MANUAL ENTRY)
Bilirubin, UA: NEGATIVE
Blood, UA: NEGATIVE
Glucose, UA: NEGATIVE mg/dL
Ketones, POC UA: NEGATIVE mg/dL
Leukocytes, UA: NEGATIVE
Nitrite, UA: NEGATIVE
Protein Ur, POC: NEGATIVE mg/dL
Spec Grav, UA: 1.025 (ref 1.010–1.025)
Urobilinogen, UA: 1 E.U./dL
pH, UA: 5.5 (ref 5.0–8.0)

## 2017-12-20 LAB — COMPREHENSIVE METABOLIC PANEL
ALT: 19 U/L (ref 17–63)
ANION GAP: 12 (ref 5–15)
AST: 14 U/L — ABNORMAL LOW (ref 15–41)
Albumin: 4.8 g/dL (ref 3.5–5.0)
Alkaline Phosphatase: 49 U/L (ref 38–126)
BILIRUBIN TOTAL: 1.7 mg/dL — AB (ref 0.3–1.2)
BUN: 9 mg/dL (ref 6–20)
CO2: 23 mmol/L (ref 22–32)
Calcium: 9.4 mg/dL (ref 8.9–10.3)
Chloride: 101 mmol/L (ref 101–111)
Creatinine, Ser: 0.95 mg/dL (ref 0.61–1.24)
GFR calc Af Amer: >60 mL/min (ref >60)
Glucose, Bld: 119 mg/dL — ABNORMAL HIGH (ref 65–99)
POTASSIUM: 3.2 mmol/L — AB (ref 3.5–5.1)
Sodium: 136 mmol/L (ref 135–145)
TOTAL PROTEIN: 8.1 g/dL (ref 6.5–8.1)

## 2017-12-20 MED ORDER — ACETAMINOPHEN-CODEINE #3 300-30 MG PO TABS: 1.0000 | ORAL_TABLET | Freq: Four times a day (QID) | ORAL | 0 refills | Status: DC | PRN

## 2017-12-20 MED ORDER — ONDANSETRON HCL 4 MG PO TABS: 4.0000 mg | ORAL_TABLET | Freq: Three times a day (TID) | ORAL | 0 refills | Status: DC | PRN

## 2017-12-20 MED ORDER — HYDROMORPHONE HCL 1 MG/ML IJ SOLN
1.0000 mg | Freq: Once | INTRAMUSCULAR | Status: AC
  Administered 2017-12-20: 1 mg via INTRAVENOUS
  Filled 2017-12-20: qty 1

## 2017-12-20 MED ORDER — MORPHINE SULFATE 15 MG PO TABS: 15.0000 mg | ORAL_TABLET | ORAL | 0 refills | Status: DC | PRN

## 2017-12-20 MED ORDER — OXYCODONE-ACETAMINOPHEN 5-325 MG PO TABS
2.0000 | ORAL_TABLET | Freq: Once | ORAL | Status: AC
  Administered 2017-12-20: 2 via ORAL
  Filled 2017-12-20: qty 2

## 2017-12-20 MED ORDER — DEXTROSE-NACL 5-0.45 % IV SOLN
INTRAVENOUS | Status: DC
  Administered 2017-12-20: 17:00:00 via INTRAVENOUS

## 2017-12-20 MED ORDER — POTASSIUM CHLORIDE CRYS ER 20 MEQ PO TBCR
40.0000 meq | EXTENDED_RELEASE_TABLET | Freq: Once | ORAL | Status: AC
  Administered 2017-12-20: 40 meq via ORAL
  Filled 2017-12-20: qty 2

## 2017-12-20 MED ORDER — KETOROLAC TROMETHAMINE 30 MG/ML IJ SOLN
30.0000 mg | INTRAMUSCULAR | Status: AC
  Administered 2017-12-20: 30 mg via INTRAVENOUS
  Filled 2017-12-20: qty 1

## 2017-12-20 NOTE — ED Triage Notes (Signed)
Patient presented to ed with c/o sickle cell pain. Patient c/o pain to the back and left shoulder rating his pain at 9/10. Patient state he was just discharged on Friday from here.

## 2017-12-20 NOTE — ED Provider Notes (Signed)
[facility]-EMERGENCY DEPT Provider Note   CSN: [id number] Arrival date & time: 12/20/17  1423     History   Chief Complaint No chief complaint on file.   HPI Austin [name] is a 27 y.o. male.  HPI Patient recently admitted for sickle cell crisis.  Was discharged 4 days ago.  States he was doing well since discharge when he began having left-sided thoracic back pain this afternoon while at work.  Was unable to take his home medication.  Denies chest pain or shortness of breath.  No recent fever or chills.  Denies headache, focal weakness or numbness. Past Medical History:  Diagnosis Date  . Eczema   . Sickle cell trait [facility])     Patient Active Problem List   Diagnosis Date Noted  . Acute sickle cell crisis (HCC) 12/15/2017  . Thrombocytopenia (HCC) 12/15/2017  . Splenomegaly 12/15/2017  . Sickle cell anemia with crisis (HCC) 12/15/2017  . 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 Spleen enlarged 08/18/2017    History reviewed. No pertinent surgical history.      Home Medications    Prior to Admission medications   Medication Sig Start Date End Date Taking? Authorizing Provider  acetaminophen-codeine (TYLENOL #3) 300-30 MG tablet Take 1 tablet by mouth every 6 (six) hours as needed for moderate pain. 12/20/17  Yes Kallie Locks, FNP  diphenhydrAMINE (BENADRYL) 25 MG tablet Take 25 mg by mouth every 6 (six) hours as needed for allergies.   Yes [provider]  loratadine (CLARITIN) 10 MG tablet Take 10 mg by mouth daily as needed for allergies.   Yes [provider]  morphine (MSIR) 15 MG tablet Take 1 tablet (15 mg total) by mouth every 4 (four) hours as needed for severe pain. 12/20/17  Yes Kallie Locks, FNP  ondansetron (ZOFRAN) 4 MG tablet Take 1 tablet (4 mg total) by mouth every 8 (eight) hours as needed for nausea or vomiting. 12/20/17   Kallie Locks, FNP    Family History Family History  Problem Relation  Age of Onset  . Sickle cell trait Mother   . Diabetes Father   . Hypertension Father     Social History Social History   Tobacco Use  . Smoking status: Current Some Day Smoker    Packs/day: 0.10    Types: Cigars  . Smokeless tobacco: Never Used  Substance Use Topics  . Alcohol use: Yes  . Drug use: Yes    Types: Marijuana     Allergies   Patient has no known allergies.   Review of Systems Review of Systems  Constitutional: Negative for chills, fatigue and fever.  Eyes: Negative for visual disturbance.  Respiratory: Negative for shortness of breath.   Cardiovascular: Negative for chest pain.  Gastrointestinal: Negative for abdominal pain, constipation, diarrhea, nausea and vomiting.  Genitourinary: Negative for dysuria, flank pain, frequency and hematuria.  Musculoskeletal: Positive for back pain and myalgias. Negative for neck pain.  Skin: Negative for rash and wound.  Neurological: Negative for dizziness, weakness, light-headedness, numbness and headaches.  All other systems reviewed and are negative.    Physical Exam Updated Vital Signs BP 118/79   Pulse (!) 58   Temp 98.4 F (36.9 C) (Oral)   Resp 16   SpO2 100%   Physical Exam  Constitutional: He is oriented to person, place, and time. He appears well-developed and well-nourished. No distress.  HENT:  Head: Normocephalic and atraumatic.  Mouth/Throat: Oropharynx is clear  and moist. No oropharyngeal exudate.  Eyes: Pupils are equal, round, and reactive to light. EOM are normal.  Neck: Normal range of motion. Neck supple.  Cardiovascular: Normal rate and regular rhythm. Exam reveals no gallop and no friction rub.  No murmur heard. Pulmonary/Chest: Effort normal and breath sounds normal. No stridor. No respiratory distress. He has no wheezes. He has no rales. He exhibits no tenderness.  Abdominal: Soft. Bowel sounds are normal. There is no tenderness. There is no rebound and no guarding.  Musculoskeletal:  Normal range of motion. He exhibits tenderness. He exhibits no edema.  Patient with left-sided worsening inferior thoracic and upper lumbar tenderness to palpation.  No definite midline thoracic or lumbar tenderness.  No step-offs.  No lower extremity swelling, asymmetry or tenderness.  Distal pulses intact.  Lymphadenopathy:    He has no cervical adenopathy.  Neurological: He is alert and oriented to person, place, and time.  Moves all extremities without focal deficit.  Sensation fully intact.  Skin: Skin is warm and dry. Capillary refill takes less than 2 seconds. No rash noted. He is not diaphoretic. No erythema.  Psychiatric: He has a normal mood and affect. His behavior is normal.  Nursing note and vitals reviewed.    ED Treatments / Results  Labs (all labs ordered are listed, but only abnormal results are displayed) Labs Reviewed  COMPREHENSIVE METABOLIC PANEL - Abnormal; Notable for the following components:      Result Value   Potassium 3.2 (*)    Glucose, Bld 119 (*)    AST 14 (*)    Total Bilirubin 1.7 (*)    All other components within normal limits  CBC WITH DIFFERENTIAL/PLATELET - Abnormal; Notable for the following components:   Hemoglobin 12.3 (*)    HCT 35.8 (*)    Platelets 123 (*)    All other components within normal limits  RETICULOCYTES - Abnormal; Notable for the following components:   Retic Ct Pct 5.8 (*)    Retic Count, Absolute 265.6 (*)    All other components within normal limits    EKG None  Radiology No results found.  Procedures Procedures (including critical care time)  Medications Ordered in ED Medications  dextrose 5 %-0.45 % sodium chloride infusion ( Intravenous Stopped 12/20/17 1909)  ketorolac (TORADOL) 30 MG/ML injection 30 mg (30 mg Intravenous Given 12/20/17 1722)  HYDROmorphone (DILAUDID) injection 1 mg (1 mg Intravenous Given 12/20/17 1722)  potassium chloride SA (K-DUR,KLOR-CON) CR tablet 40 mEq (40 mEq Oral Given 12/20/17 1722)   oxyCODONE-acetaminophen (PERCOCET/ROXICET) 5-325 MG per tablet 2 tablet (2 tablets Oral Given 12/20/17 1850)     Initial Impression / Assessment and Plan / ED Course  I have reviewed the triage vital signs and the nursing notes.  Pertinent labs & imaging results that were available during my care of the patient were reviewed by me and considered in my medical decision making (see chart for details).     States his pain is significantly improved after single dose of IV pain medication and oral medication.  Return precautions given.  Advised to follow-up with his primary physician.  Final Clinical Impressions(s) / ED Diagnoses   Final diagnoses:  Sickle cell crisis [facility])    ED Discharge Orders    None       Loren Racer, MD 12/20/17 1925

## 2017-12-20 NOTE — Progress Notes (Signed)
Subjective:     Patient ID: Austin [name], male   DOB: 08-26-1990, 27 y.o.   MRN: [id number]   Chief Complaint  Patient presents with  . 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 He is here today for follow. He was recently admitted for Sickle Cell Disease on 12/15/2017.   Current Status: He is doing well today with minimal pain in back and under his shoulder blades. He denies cough, shortness of breath, chest pain, and heart palpitations. He had mild fatigue. Denies fevers, chills, recent infections, weight loss, and night sweats.   Denies abdominal pain, loss of appetite, nausea, vomiting, diarrhea, and constipation. He denies blood in stools and hematuria.   He states that he has mild pain located in his back and shoulder blades.   Past Medical History:  Diagnosis Date  . Eczema   . Sickle cell trait (HCC)    Social History   Socioeconomic History  . Marital status: Married    Spouse name: Not on file  . Number of children: Not on file  . Years of education: Not on file  . Highest education level: Not on file  Occupational History  . Not on file  Social Needs  . Financial resource strain: Not on file  . Food insecurity:    Worry: Not on file    Inability: Not on file  . Transportation needs:    Medical: Not on file    Non-medical: Not on file  Tobacco Use  . Smoking status: Current Some Day Smoker    Packs/day: 0.10    Types: Cigars  . Smokeless tobacco: Never Used  Substance and Sexual Activity  . Alcohol use: Yes  . Drug use: Yes    Types: Marijuana  . Sexual activity: Not on file  Lifestyle  . Physical activity:    Days per week: Not on file    Minutes per session: Not on file  . Stress: Not on file  Relationships  . Social connections:    Talks on phone: Not on file    Gets together: Not on file    Attends religious service: Not on file    Active member of club or organization: Not on  file    Attends meetings of clubs or organizations: Not on file    Relationship status: Not on file  . Intimate partner violence:    Fear of current or ex partner: Not on file    Emotionally abused: Not on file    Physically abused: Not on file    Forced sexual activity: Not on file  Other Topics Concern  . Not on file  Social History Narrative  . Not on file   No past surgical history on file.    There is no immunization history on file for this patient.   Current Meds  Medication Sig  . acetaminophen-codeine (TYLENOL #3) 300-30 MG tablet Take 1 tablet by mouth every 6 (six) hours as needed for moderate pain.  Marland Kitchen morphine (MSIR) 15 MG tablet Take 1 tablet (15 mg total) by mouth every 4 (four) hours as needed for severe pain.  Marland Kitchen ondansetron (ZOFRAN) 4 MG tablet Take 1 tablet (4 mg total) by mouth every 8 (eight) hours as needed for nausea or vomiting.  . [DISCONTINUED] acetaminophen-codeine (TYLENOL #3) 300-30 MG tablet Take 1 tablet by mouth every 6 (six) hours as needed for moderate pain.  . [  DISCONTINUED] morphine (MSIR) 15 MG tablet Take 1 tablet (15 mg total) by mouth every 4 (four) hours as needed for severe pain.  . [DISCONTINUED] ondansetron (ZOFRAN) 4 MG tablet Take 1 tablet (4 mg total) by mouth every 8 (eight) hours as needed for nausea or vomiting.    No Known Allergies  BP 132/86 (BP Location: Left Arm, Patient Position: Sitting, Cuff Size: Small)   Pulse 84   Temp 98.3 F (36.8 C) (Oral)   Ht  (1.702 m)   Wt 166 lb (75.3 kg)   SpO2 99%   BMI 26.00 kg/m    Review of Systems  Constitutional: Negative.   HENT: Negative.   Eyes: Negative.   Respiratory: Negative.   Cardiovascular: Negative.   Gastrointestinal: Negative.   Endocrine: Negative.   Genitourinary: Negative.   Musculoskeletal: Positive for arthralgias (back and shoulders).  Skin: Negative.   Allergic/Immunologic: Negative.   Neurological: Negative.   Hematological: Negative.    Psychiatric/Behavioral: Negative.    Objective:   Physical Exam  Constitutional: He is oriented to person, place, and time. He appears well-developed and well-nourished.  HENT:  Head: Normocephalic and atraumatic.  Right Ear: External ear normal.  Left Ear: External ear normal.  Nose: Nose normal.  Mouth/Throat: Oropharynx is clear and moist.  Eyes: Pupils are equal, round, and reactive to light. Conjunctivae and EOM are normal.  Neck: Normal range of motion. Neck supple.  Cardiovascular: Normal rate, regular rhythm, normal heart sounds and intact distal pulses.  Pulmonary/Chest: Effort normal and breath sounds normal.  Abdominal: Soft. Bowel sounds are normal. There is tenderness (Mid abdomen). There is guarding (Mid abdomen).  Musculoskeletal: Normal range of motion.  Neurological: He is alert and oriented to person, place, and time.  Skin: Skin is warm and dry. Capillary refill takes less than 2 seconds.  Psychiatric: He has a normal mood and affect. His behavior is normal. Judgment and thought content normal.  Nursing note and vitals reviewed.  Assessment:   1. Hemoglobin C-C disease (HCC) 2. Pain 3. Follow up  Plan:   1. Hemoglobin C-C disease (HCC) Stable. He is doing well today with minimal pain in back and under shoulder blades. Urinalysis is stable with Glucose >500.  - POCT urinalysis dipstick  2. Pain Stable. We will refill Tylenol # 3, MSIR, and Zofran today.   3. Follow up He will follow up in 2 months.   Meds ordered this encounter  Medications  . acetaminophen-codeine (TYLENOL #3) 300-30 MG tablet    Sig: Take 1 tablet by mouth every 6 (six) hours as needed for moderate pain.    Dispense:  30 tablet    Refill:  0  . morphine (MSIR) 15 MG tablet    Sig: Take 1 tablet (15 mg total) by mouth every 4 (four) hours as needed for severe pain.    Dispense:  25 tablet    Refill:  0  . ondansetron (ZOFRAN) 4 MG tablet    Sig: Take 1 tablet (4 mg total) by  mouth every 8 (eight) hours as needed for nausea or vomiting.    Dispense:  25 tablet    Refill:  0   [name],  MSN, FNP-BC Patient [facility] [address] [place], [place] [address] [phone]

## 2017-12-21 ENCOUNTER — Other Ambulatory Visit: Payer: Self-pay | Admitting: *Deleted

## 2017-12-21 NOTE — Patient Outreach (Signed)
[facility]) Care Management  12/21/2017  CHRLES SELLEY 1990/09/17 [id number]   Subjective: Telephone call to patient's home number, no answer, left HIPAA compliant voicemail message, and requested call back.     Objective: Per KPN (Knowledge Performance Now, point of care tool) and chart review, patient hospitalized 12/15/17 -12/16/17 for Acute sickle cell crisis.   Had ED visit on 12/20/17 for sickle cell crisis.    Patient also has a history of Thrombocytopenia and Splenomegaly.     Assessment: Received UMR Transition of care referral on 12/16/17.   Transition of care follow up pending patient contact.      Plan: RNCM will send unsuccessful outreach  letter, [facility] pamphlet, will call patient for 2nd telephone outreach attempt, transition of care follow up, and proceed with case closure, within 10 business days if no return call.       Dabney Schanz H. Gardiner Barefoot, BSN, CCM [facility] Care Management [facility] Telephonic CM Phone: [phone] Fax: [phone]

## 2017-12-22 ENCOUNTER — Other Ambulatory Visit: Payer: Self-pay | Admitting: *Deleted

## 2017-12-22 NOTE — Patient Outreach (Signed)
[facility]) Care Management  12/22/2017  Austin [name] 07/16/1991 [id number]   Subjective: Telephone call to patient's home / mobile number, spoke with patient, and HIPAA verified.  Discussed [facility] Care Management UMR Transition of care follow up, patient voiced understanding, and is in agreement to follow up.   Patient states is doing better, has returned to work, had follow up appointment with primary provider on 12/20/17, has ED visit on 12/20/17, and next follow up visit will be 02/20/18.  Patient is aware of signs and symptoms to report to MD, will seek sooner follow up appointment if needed.  Patient voices understanding of medical diagnosis and treatment plan.  Patient states he is able to manage self care and has assistance as needed with activities of daily living / home management. States he is accessing the following [facility] benefits: outpatient pharmacy, hospital indemnity (provided patient with contact information for [employer] [phone] per his request, will file claim if appropriate, verbally given contact number for [facility] [phone] to request itemized bill), and has family medical leave act (FMLA) in place.   Patient states he spoken with [facility] regarding hospital indemnity, RNCM advised hospital indemnity provider is [employer],  He will need to contact [employer] for directions on how to file claim, they will be able to answer questions regarding hospital indemnity, patient voices understanding, states he will follow up with [employer], and is very appreciative of the information.  Patient states he does not have any education material, transition of care, care coordination, disease management, disease monitoring, transportation, community resource, or pharmacy needs at this time.  States he is very appreciative of the follow up and is in agreement to receive [facility] Care Management information.    Objective:Per KPN (Knowledge Performance Now, point of care tool) and  chart review,patient hospitalized 12/15/17 -12/16/17 forAcute sickle cell crisis. Had ED visit on 12/20/17 forsickle cell crisis. Patient also has a history of ThrombocytopeniaandSplenomegaly.     Assessment: Received UMR Transition of care referral on 12/16/17.Transition of care follow up completed, no care management needs, and will proceed with case closure.      Plan:RNCM will send patient successful outreach letter, [facility] pamphlet, and magnet. RNCM will complete case closure due to follow up completed / no care management needs.       Aleeyah Bensen H. Gardiner Barefoot, BSN, CCM [facility] Care Management [facility] Telephonic CM Phone: [phone] Fax: [phone]

## 2017-12-22 NOTE — Patient Outreach (Signed)
[facility]) Care Management  12/22/2017  Austin [name] 1990-10-27 [id number]   Subjective: Telephone call to patient's home / mobile number, spoke with patient, and HIPAA verified.  Discussed [facility] Care Management UMR Transition of care follow up, patient voiced understanding, and is in agreement to follow up.   Patient states he is currently at work, currently busy, unable to talk, has questions regarding hospital indemnity plan, and will call back later today during lunch break.   Objective: Per KPN (Knowledge Performance Now, point of care tool) and chart review, patient hospitalized 12/15/17 -12/16/17 for Acute sickle cell crisis.   Had ED visit on 12/20/17 for sickle cell crisis.    Patient also has a history of Thrombocytopenia and Splenomegaly.     Assessment: Received UMR Transition of care referral on 12/16/17.   Transition of care follow up pending patient contact.      Plan: RNCM has sent unsuccessful outreach  letter, [facility] pamphlet, will call patient for 3rd telephone outreach attempt, transition of care follow up, and proceed with case closure, within 10 business days if no return call.      Austin [name] H. Gardiner Barefoot, BSN, CCM [facility] Care Management [facility] Telephonic CM Phone: [phone] Fax: [phone]

## 2017-12-23 ENCOUNTER — Ambulatory Visit: Payer: Self-pay | Admitting: *Deleted

## 2018-02-08 ENCOUNTER — Emergency Department (HOSPITAL_COMMUNITY)
Admission: EM | Admit: 2018-02-08 | Discharge: 2018-02-08 | Discharge status: Against Medical Advice | Payer: 59 | Attending: Emergency Medicine | Admitting: Emergency Medicine

## 2018-02-08 ENCOUNTER — Encounter (HOSPITAL_COMMUNITY): Payer: Self-pay | Admitting: Emergency Medicine

## 2018-02-08 ENCOUNTER — Other Ambulatory Visit: Payer: Self-pay

## 2018-02-08 DIAGNOSIS — D5700 Hb-SS disease with crisis, unspecified: Secondary | ICD-10-CM | POA: Diagnosis present

## 2018-02-08 DIAGNOSIS — Z5321 Procedure and treatment not carried out due to patient leaving prior to being seen by health care provider: Secondary | ICD-10-CM | POA: Insufficient documentation

## 2018-02-08 LAB — CBC WITH DIFFERENTIAL/PLATELET
Basophils Absolute: 0 10*3/uL (ref 0.0–0.1)
Basophils Relative: 0 %
EOS PCT: 1 %
Eosinophils Absolute: 0.1 10*3/uL (ref 0.0–0.7)
HCT: 35 % — ABNORMAL LOW (ref 39.0–52.0)
HEMOGLOBIN: 12.6 g/dL — AB (ref 13.0–17.0)
LYMPHS ABS: 2.8 10*3/uL (ref 0.7–4.0)
LYMPHS PCT: 33 %
MCH: 28.3 pg (ref 26.0–34.0)
MCHC: 36 g/dL (ref 30.0–36.0)
MCV: 78.5 fL (ref 78.0–100.0)
MONOS PCT: 6 %
Monocytes Absolute: 0.5 10*3/uL (ref 0.1–1.0)
NEUTROS PCT: 60 %
Neutro Abs: 5.1 10*3/uL (ref 1.7–7.7)
Platelets: 134 10*3/uL — ABNORMAL LOW (ref 150–400)
RBC: 4.46 MIL/uL (ref 4.22–5.81)
RDW: 15 % (ref 11.5–15.5)
WBC: 8.4 10*3/uL (ref 4.0–10.5)

## 2018-02-08 LAB — RETICULOCYTES
RBC.: 4.46 MIL/uL (ref 4.22–5.81)
Retic Count, Absolute: 182.9 10*3/uL (ref 19.0–186.0)
Retic Ct Pct: 4.1 % — ABNORMAL HIGH (ref 0.4–3.1)

## 2018-02-08 LAB — COMPREHENSIVE METABOLIC PANEL
ALBUMIN: 4.4 g/dL (ref 3.5–5.0)
ALT: 29 U/L (ref 0–44)
AST: 14 U/L — AB (ref 15–41)
Alkaline Phosphatase: 49 U/L (ref 38–126)
Anion gap: 7 (ref 5–15)
BUN: 10 mg/dL (ref 6–20)
CHLORIDE: 103 mmol/L (ref 98–111)
CO2: 29 mmol/L (ref 22–32)
Calcium: 9.4 mg/dL (ref 8.9–10.3)
Creatinine, Ser: 0.97 mg/dL (ref 0.61–1.24)
GFR calc Af Amer: >60 mL/min (ref >60)
Glucose, Bld: 104 mg/dL — ABNORMAL HIGH (ref 70–99)
POTASSIUM: 3.7 mmol/L (ref 3.5–5.1)
SODIUM: 139 mmol/L (ref 135–145)
Total Bilirubin: 1.6 mg/dL — ABNORMAL HIGH (ref 0.3–1.2)
Total Protein: 7.9 g/dL (ref 6.5–8.1)

## 2018-02-08 NOTE — ED Triage Notes (Signed)
Pt reports increased pain in low back over last 7 days. Reports weakness in lower legs that started this am. Able to walk with dome discomfort at work today. Reports increased pain that is unresponsive to his prescribed medications. Pt denies NV or shortness of breath, denies chest pain.

## 2018-02-13 ENCOUNTER — Telehealth: Payer: Self-pay

## 2018-02-14 ENCOUNTER — Other Ambulatory Visit: Payer: Self-pay | Admitting: Internal Medicine

## 2018-02-14 MED ORDER — MORPHINE SULFATE 15 MG PO TABS: 15.0000 mg | ORAL_TABLET | ORAL | 0 refills | Status: DC | PRN

## 2018-02-14 NOTE — Telephone Encounter (Signed)
Morphine refilled. Please let the patient know he should only be on one short acting controlled medication. Tylenol with Codeine and Morphine immediate release are both short acting. Thanks

## 2018-02-20 ENCOUNTER — Encounter: Payer: Self-pay | Admitting: Family Medicine

## 2018-02-20 ENCOUNTER — Ambulatory Visit (INDEPENDENT_AMBULATORY_CARE_PROVIDER_SITE_OTHER): Payer: 59 | Admitting: Family Medicine

## 2018-02-20 VITALS — BP 108/72 | HR 66 | Temp 98.8°F | Ht 67.0 in | Wt 168.0 lb

## 2018-02-20 DIAGNOSIS — G894 Chronic pain syndrome: Secondary | ICD-10-CM | POA: Diagnosis not present

## 2018-02-20 DIAGNOSIS — Z09 Encounter for follow-up examination after completed treatment for conditions other than malignant neoplasm: Secondary | ICD-10-CM

## 2018-02-20 DIAGNOSIS — D582 Other hemoglobinopathies: Secondary | ICD-10-CM

## 2018-02-20 LAB — POCT URINALYSIS DIP (MANUAL ENTRY)
Bilirubin, UA: NEGATIVE
Blood, UA: NEGATIVE
Glucose, UA: NEGATIVE mg/dL
Ketones, POC UA: NEGATIVE mg/dL
Leukocytes, UA: NEGATIVE
Nitrite, UA: NEGATIVE
Protein Ur, POC: NEGATIVE mg/dL
Spec Grav, UA: 1.025 (ref 1.010–1.025)
Urobilinogen, UA: 0.2 E.U./dL
pH, UA: 5.5 (ref 5.0–8.0)

## 2018-02-20 NOTE — Progress Notes (Signed)
Follow Up  Subjective:     Patient ID: Austin [name], male   DOB: [birth year], 27 y.o.   MRN: [id number]   Chief Complaint  Patient presents with  . Follow-up    2 month on sickle cell    HPI  [name] has history of Sickle Cell Disease, Hemoglobin CC. He is here today for follow. He was recently admitted for Sickle Cell Disease on 12/15/2017.   Current Status: He is doing well today with minimal pain in back and under his shoulder blades. He denies cough, shortness of breath, chest pain, and heart palpitations. He had mild fatigue. Denies fevers, chills, recent infections, weight loss, and night sweats.   Denies abdominal pain, loss of appetite, nausea, vomiting, diarrhea, and constipation. He denies blood in stools and hematuria.   He states that he has mild pain located in his back and shoulder blades.   Past Medical History:  Diagnosis Date  . Eczema   . Sickle cell trait (HCC)    Social History   Socioeconomic History  . Marital status: Married    Spouse name: Not on file  . Number of children: Not on file  . Years of education: Not on file  . Highest education level: Not on file  Occupational History  . Not on file  Social Needs  . Financial resource strain: Not on file  . Food insecurity:    Worry: Not on file    Inability: Not on file  . Transportation needs:    Medical: Not on file    Non-medical: Not on file  Tobacco Use  . Smoking status: Former Smoker    Packs/day: 0.10  . Smokeless tobacco: Never Used  Substance and Sexual Activity  . Alcohol use: Yes    Comment: occ  . Drug use: Yes    Types: Marijuana  . Sexual activity: Not on file  Lifestyle  . Physical activity:    Days per week: Not on file    Minutes per session: Not on file  . Stress: Not on file  Relationships  . Social connections:    Talks on phone: Not on file    Gets together: Not on file    Attends religious service: Not on file    Active member of club or  organization: Not on file    Attends meetings of clubs or organizations: Not on file    Relationship status: Not on file  . Intimate partner violence:    Fear of current or ex partner: Not on file    Emotionally abused: Not on file    Physically abused: Not on file    Forced sexual activity: Not on file  Other Topics Concern  . Not on file  Social History Narrative  . Not on file   Past Surgical History:  Procedure Laterality Date  . WISDOM TOOTH EXTRACTION        There is no immunization history on file for this patient.   Current Meds  Medication Sig  . diphenhydrAMINE (BENADRYL) 25 MG tablet Take 25 mg by mouth every 6 (six) hours as needed for allergies.  Marland Kitchen. loratadine (CLARITIN) 10 MG tablet Take 10 mg by mouth daily as needed for allergies.  Marland Kitchen. morphine (MSIR) 15 MG tablet Take 1 tablet (15 mg total) by mouth every 4 (four) hours as needed for up to 15 days for severe pain.  Marland Kitchen. ondansetron (ZOFRAN) 4 MG tablet Take 1 tablet (4 mg total) by mouth every  8 (eight) hours as needed for nausea or vomiting.    No Known Allergies  BP 108/72 (BP Location: Right Arm, Patient Position: Sitting, Cuff Size: Small)   Pulse 66   Temp 98.8 F (37.1 C) (Oral)   Ht 5\' 7"  (1.702 m)   Wt 168 lb (76.2 kg)   SpO2 100%   BMI 26.31 kg/m    Review of Systems  Constitutional: Negative.   HENT: Negative.   Eyes: Negative.   Respiratory: Negative.   Cardiovascular: Negative.   Gastrointestinal: Negative.   Endocrine: Negative.   Genitourinary: Negative.   Musculoskeletal: Positive for arthralgias (back and shoulders).  Skin: Negative.   Allergic/Immunologic: Negative.   Neurological: Negative.   Hematological: Negative.   Psychiatric/Behavioral: Negative.    Objective:   Physical Exam  Constitutional: He is oriented to person, place, and time. He appears well-developed and well-nourished.  HENT:  Head: Normocephalic and atraumatic.  Right Ear: External ear normal.  Left Ear:  External ear normal.  Nose: Nose normal.  Mouth/Throat: Oropharynx is clear and moist.  Eyes: Pupils are equal, round, and reactive to light. Conjunctivae and EOM are normal.  Neck: Normal range of motion. Neck supple.  Cardiovascular: Normal rate, regular rhythm, normal heart sounds and intact distal pulses.  Pulmonary/Chest: Effort normal and breath sounds normal.  Abdominal: Soft. Bowel sounds are normal. There is tenderness (Mid abdomen). There is guarding (Mid abdomen).  Musculoskeletal: Normal range of motion.  Neurological: He is alert and oriented to person, place, and time.  Skin: Skin is warm and dry. Capillary refill takes less than 2 seconds.  Psychiatric: He has a normal mood and affect. His behavior is normal. Judgment and thought content normal.  Nursing note and vitals reviewed.  Assessment:   1. Hemoglobin C-C disease (HCC) 2. Pain 3. Follow up  Plan:   1. Hemoglobin C-C disease (HCC) Stable. He is doing well today with minimal pain in back and under shoulder blades. Urinalysis is stable today.   He will continue these things to avoid crisis: -continue to take pain medications as prescribed,   -continue to avoid extreme heat and cold.  -continue to eat a healthy diet and drink at least 64 ounces of water daily.  -avoid colds and flu.  -get plenty of sleep and rest.  -avoid high stressful situations -continue Folic Acid 1 mg daily.   - POCT urinalysis dipstick  2. Pain He has increased pain in lower back today.  He will pick up Rx for [facility] /today, which was filled on 02/14/2018.  3. Follow up He will follow up in 2 months.   No orders of the defined types were placed in this encounter.  [name],  MSN, FNP-BC [facility] [address] [place], [place] [address] [phone]

## 2018-03-07 ENCOUNTER — Other Ambulatory Visit: Payer: Self-pay | Admitting: Family Medicine

## 2018-03-07 ENCOUNTER — Non-Acute Institutional Stay (HOSPITAL_COMMUNITY)
Admission: AD | Admit: 2018-03-07 | Discharge: 2018-03-07 | Disposition: A | Discharge status: Home / Self Care | Payer: 59 | Source: Ambulatory Visit | Attending: Internal Medicine | Admitting: Internal Medicine

## 2018-03-07 ENCOUNTER — Telehealth (HOSPITAL_COMMUNITY): Payer: Self-pay | Admitting: Internal Medicine

## 2018-03-07 ENCOUNTER — Encounter (HOSPITAL_COMMUNITY): Payer: Self-pay

## 2018-03-07 DIAGNOSIS — D5700 Hb-SS disease with crisis, unspecified: Secondary | ICD-10-CM | POA: Diagnosis not present

## 2018-03-07 DIAGNOSIS — D582 Other hemoglobinopathies: Secondary | ICD-10-CM

## 2018-03-07 DIAGNOSIS — L309 Dermatitis, unspecified: Secondary | ICD-10-CM | POA: Insufficient documentation

## 2018-03-07 DIAGNOSIS — Z8249 Family history of ischemic heart disease and other diseases of the circulatory system: Secondary | ICD-10-CM | POA: Insufficient documentation

## 2018-03-07 DIAGNOSIS — Z833 Family history of diabetes mellitus: Secondary | ICD-10-CM | POA: Diagnosis not present

## 2018-03-07 DIAGNOSIS — Z832 Family history of diseases of the blood and blood-forming organs and certain disorders involving the immune mechanism: Secondary | ICD-10-CM | POA: Insufficient documentation

## 2018-03-07 DIAGNOSIS — G894 Chronic pain syndrome: Secondary | ICD-10-CM

## 2018-03-07 DIAGNOSIS — Z79899 Other long term (current) drug therapy: Secondary | ICD-10-CM | POA: Insufficient documentation

## 2018-03-07 LAB — CBC WITH DIFFERENTIAL/PLATELET
BASOS ABS: 0 10*3/uL (ref 0.0–0.1)
BASOS PCT: 0 %
EOS ABS: 0.1 10*3/uL (ref 0.0–0.7)
Eosinophils Relative: 1 %
HCT: 37.3 % — ABNORMAL LOW (ref 39.0–52.0)
HEMOGLOBIN: 13.2 g/dL (ref 13.0–17.0)
Lymphocytes Relative: 17 %
Lymphs Abs: 1.7 10*3/uL (ref 0.7–4.0)
MCH: 27.5 pg (ref 26.0–34.0)
MCHC: 35.4 g/dL (ref 30.0–36.0)
MCV: 77.7 fL — ABNORMAL LOW (ref 78.0–100.0)
MONOS PCT: 8 %
Monocytes Absolute: 0.8 10*3/uL (ref 0.1–1.0)
NEUTROS PCT: 74 %
Neutro Abs: 7.3 10*3/uL (ref 1.7–7.7)
Platelets: 119 10*3/uL — ABNORMAL LOW (ref 150–400)
RBC: 4.8 MIL/uL (ref 4.22–5.81)
RDW: 14.9 % (ref 11.5–15.5)
WBC: 9.9 10*3/uL (ref 4.0–10.5)

## 2018-03-07 LAB — COMPREHENSIVE METABOLIC PANEL
ALK PHOS: 50 U/L (ref 38–126)
ALT: 19 U/L (ref 0–44)
AST: 10 U/L — ABNORMAL LOW (ref 15–41)
Albumin: 4.7 g/dL (ref 3.5–5.0)
Anion gap: 9 (ref 5–15)
BILIRUBIN TOTAL: 1.9 mg/dL — AB (ref 0.3–1.2)
BUN: 10 mg/dL (ref 6–20)
CALCIUM: 9.8 mg/dL (ref 8.9–10.3)
CO2: 24 mmol/L (ref 22–32)
CREATININE: 0.86 mg/dL (ref 0.61–1.24)
Chloride: 105 mmol/L (ref 98–111)
Glucose, Bld: 86 mg/dL (ref 70–99)
Potassium: 4 mmol/L (ref 3.5–5.1)
Sodium: 138 mmol/L (ref 135–145)
TOTAL PROTEIN: 8.2 g/dL — AB (ref 6.5–8.1)

## 2018-03-07 MED ORDER — HYDROMORPHONE HCL 1 MG/ML IJ SOLN
1.0000 mg | Freq: Once | INTRAMUSCULAR | Status: AC
  Administered 2018-03-07: 1 mg via INTRAVENOUS
  Filled 2018-03-07: qty 1

## 2018-03-07 MED ORDER — OXYCODONE HCL 5 MG PO TABS
10.0000 mg | ORAL_TABLET | Freq: Once | ORAL | Status: AC
  Administered 2018-03-07: 10 mg via ORAL
  Filled 2018-03-07: qty 2

## 2018-03-07 MED ORDER — DEXTROSE-NACL 5-0.45 % IV SOLN
INTRAVENOUS | Status: DC
  Administered 2018-03-07: 10:00:00 via INTRAVENOUS

## 2018-03-07 MED ORDER — SENNOSIDES-DOCUSATE SODIUM 8.6-50 MG PO TABS: 1.0000 | ORAL_TABLET | Freq: Two times a day (BID) | ORAL | Status: DC

## 2018-03-07 MED ORDER — ONDANSETRON HCL 4 MG/2ML IJ SOLN
4.0000 mg | Freq: Once | INTRAMUSCULAR | Status: AC
  Administered 2018-03-07: 4 mg via INTRAVENOUS
  Filled 2018-03-07: qty 2

## 2018-03-07 MED ORDER — POLYETHYLENE GLYCOL 3350 17 G PO PACK: 17.0000 g | PACK | Freq: Every day | ORAL | Status: DC | PRN

## 2018-03-07 MED ORDER — KETOROLAC TROMETHAMINE 30 MG/ML IJ SOLN
30.0000 mg | Freq: Once | INTRAMUSCULAR | Status: AC
  Administered 2018-03-07: 30 mg via INTRAVENOUS
  Filled 2018-03-07: qty 1

## 2018-03-07 MED ORDER — MORPHINE SULFATE 15 MG PO TABS: 15.0000 mg | ORAL_TABLET | ORAL | 0 refills | Status: DC | PRN

## 2018-03-07 MED ORDER — MORPHINE SULFATE ER 30 MG PO TBCR
30.0000 mg | EXTENDED_RELEASE_TABLET | Freq: Two times a day (BID) | ORAL | Status: DC
Start: 2018-03-07 — End: 2018-03-07
  Administered 2018-03-07: 30 mg via ORAL
  Filled 2018-03-07: qty 1

## 2018-03-07 NOTE — Discharge Summary (Signed)
Physician Discharge Summary  Austin MikeKeith D Cathell AVW:[id number] DOB: 05-22-1991 DOA: 03/07/2018  PCP: Kallie LocksStroud, Natalie M, FNP  Admit date: 03/07/2018  Discharge date: 03/07/2018  Time spent: 30 minutes  Discharge Diagnoses:  Active Problems:   Sickle cell anemia with crisis [facility](HCC)   Discharge Condition: Stable  Diet recommendation: Regular  Filed Weights   03/07/18 0938  Weight: 76.2 kg    History of present illness:  Austin [name] is a 27 y.o. male with history of sickle cell disease who presented to the day hospital today with major complaint of generalized body pain consistent with his usual sickle cell pain crisis. He was unable to afford the co-pay for his prescribed pain medications, that's why his pain has been uncontrolled for few days and it escalated this morning. He rates his pain at a 10/10, mostly in his lower back and legs. He denies any fever, chest pain, SOB, nausea or vomiting or diarrhea. No headache, no dizziness. Patient will be admitted to the day hospital for extended observation and pain management.  Hospital Course:  Austin [name] was admitted to the day hospital with sickle cell painful crisis. Patient was treated with weight based IV Dilaudid PCA, IV Toradol, clinician assisted doses as deemed appropriate and IV fluids. Austin [name] showed significant improvement symptomatically, pain improved from 10 to 3/10 at the time of discharge. Patient was discharged home in a hemodynamically stable condition. Austin [name] will follow-up at the clinic as previously scheduled, continue with home medications as per prior to admission.  Discharge Instructions We discussed the need for good hydration, monitoring of hydration status, avoidance of heat, cold, stress, and infection triggers. We discussed the need to be compliant with taking Hydrea and other home medications. Austin [name] was reminded of the need to seek medical attention immediately if any symptom of bleeding, anemia, or  infection occurs.  Discharge Exam: Vitals:   03/07/18 1226 03/07/18 1427  BP: (!) 106/57 114/67  Pulse: 63 (!) 53  Resp: 16   Temp:    SpO2: 98% 100%   General appearance: alert, cooperative and no distress Eyes: conjunctivae/corneas clear. PERRL, EOM's intact. Fundi benign. Neck: no adenopathy, no carotid bruit, no JVD, supple, symmetrical, trachea midline and thyroid not enlarged, symmetric, no tenderness/mass/nodules Back: symmetric, no curvature. ROM normal. No CVA tenderness. Resp: clear to auscultation bilaterally Chest wall: no tenderness Cardio: regular rate and rhythm, S1, S2 normal, no murmur, click, rub or gallop GI: soft, non-tender; bowel sounds normal; no masses,  no organomegaly Extremities: extremities normal, atraumatic, no cyanosis or edema Pulses: 2+ and symmetric Skin: Skin color, texture, turgor normal. No rashes or lesions Neurologic: Grossly normal  Discharge Instructions    Diet - low sodium heart healthy   Complete by:  As directed    Increase activity slowly   Complete by:  As directed      Allergies as of 03/07/2018   No Known Allergies     Medication List    TAKE these medications   diphenhydrAMINE 25 MG tablet Commonly known as:  BENADRYL Take 25 mg by mouth every 6 (six) hours as needed for allergies.   loratadine 10 MG tablet Commonly known as:  CLARITIN Take 10 mg by mouth daily as needed for allergies.   ondansetron 4 MG tablet Commonly known as:  ZOFRAN Take 1 tablet (4 mg total) by mouth every 8 (eight) hours as needed for nausea or vomiting.      No Known Allergies Follow-up Information    Bradly ChrisStroud,  Rolm GalaNatalie M, FNP Follow up.   Specialty:  Family Medicine Contact information: [address] [place] [id number] [phone]          Significant Diagnostic Studies: No results found.  Signed:  Jeanann Lewandowskylugbemiga Friedrich Harriott MD, MHA, FACP, Constance GoltzFAAP, CPE   03/07/2018, 3:15 PM

## 2018-03-07 NOTE — Progress Notes (Signed)
Pt discharged to home; discharge instructions explained, given, and signed; all questions answered; pt alert, oriented, and ambulatory; rates pain level 3/10 upon discharge; no complications noted

## 2018-03-07 NOTE — Progress Notes (Signed)
[facility]

## 2018-03-07 NOTE — Telephone Encounter (Signed)
Pt arrived at patient care center states has pain in back related to sickle cell disease; pt states has been out of home pain medication for 3 days; denies chest pain, vomiting, diarrhea, or shortness of breath; MD notified; pt admitted for evaluation

## 2018-03-07 NOTE — Discharge Instructions (Signed)
Sickle Cell Anemia, Adult °Sickle cell anemia is a condition where your red blood cells are shaped like sickles. Red blood cells carry oxygen through the body. Sickle-shaped red blood cells do not live as long as normal red blood cells. They also clump together and block blood from flowing through the blood vessels. These things prevent the body from getting enough oxygen. Sickle cell anemia causes organ damage and pain. It also increases the risk of infection. °Follow these instructions at home: °· Drink enough fluid to keep your pee (urine) clear or pale yellow. Drink more in hot weather and during exercise. °· Do not smoke. Smoking lowers oxygen levels in the blood. °· Only take over-the-counter or prescription medicines as told by your doctor. °· Take antibiotic medicines as told by your doctor. Make sure you finish them even if you start to feel better. °· Take supplements as told by your doctor. °· Consider wearing a medical alert bracelet. This tells anyone caring for you in an emergency of your condition. °· When traveling, keep your medical information, doctors' names, and the medicines you take with you at all times. °· If you have a fever, do not take fever medicines right away. This could cover up a problem. Tell your doctor. °· Keep all follow-up visits with your doctor. Sickle cell anemia requires regular medical care. °Contact a doctor if: °You have a fever. °Get help right away if: °· You feel dizzy or faint. °· You have new belly (abdominal) pain, especially on the left side near the stomach area. °· You have a lasting, often uncomfortable and painful erection of the penis (priapism). If it is not treated right away, you will become unable to have sex (impotence). °· You have numbness in your arms or legs or you have a hard time moving them. °· You have a hard time talking. °· You have a fever or lasting symptoms for more than 2-3 days. °· You have a fever and your symptoms suddenly get  worse. °· You have signs or symptoms of infection. These include: °? Chills. °? Being more tired than normal (lethargy). °? Irritability. °? Poor eating. °? Throwing up (vomiting). °· You have pain that is not helped with medicine. °· You have shortness of breath. °· You have pain in your chest. °· You are coughing up pus-like or bloody mucus. °· You have a stiff neck. °· Your feet or hands swell or have pain. °· Your belly looks bloated. °· Your joints hurt. °This information is not intended to replace advice given to you by your health care provider. Make sure you discuss any questions you have with your health care provider. °Document Released: 05/02/2013 Document Revised: 12/18/2015 Document Reviewed: 02/21/2013 °Elsevier Interactive Patient Education © 2017 [employer] ° °

## 2018-03-07 NOTE — H&P (Signed)
[facility] History and Physical  Austin MikeKeith D Kluth WUJ:[id number] DOB: 1991/02/24 DOA: 03/07/2018  PCP: Kallie LocksStroud, Natalie M, FNP   Chief Complaint:  Chief Complaint  Patient presents with  . Sickle Cell Pain Crisis    Back    HPI: Austin [name] is a 27 y.o. male with history of sickle cell disease who presented to the day hospital today with major complaint of generalized body pain consistent with his usual sickle cell pain crisis. He was unable to afford the co-pay for his prescribed pain medications, that's why his pain has been uncontrolled for few days and it escalated this morning. He rates his pain at a 10/10, mostly in his lower back and legs. He denies any fever, chest pain, SOB, nausea or vomiting or diarrhea. No headache, no dizziness. Patient will be admitted to the day hospital for extended observation and pain management.  Systemic Review: General: The patient denies anorexia, fever, weight loss Cardiac: Denies chest pain, syncope, palpitations, pedal edema  Respiratory: Denies cough, shortness of breath, wheezing GI: Denies severe indigestion/heartburn, abdominal pain, nausea, vomiting, diarrhea and constipation GU: Denies hematuria, incontinence, dysuria  Musculoskeletal: Denies arthritis  Skin: Denies suspicious skin lesions Neurologic: Denies focal weakness or numbness, change in vision  Past Medical History:  Diagnosis Date  . Eczema   . Sickle cell trait [facility](HCC)     Past Surgical History:  Procedure Laterality Date  . WISDOM TOOTH EXTRACTION      No Known Allergies  Family History  Problem Relation Age of Onset  . Sickle cell trait Mother   . Diabetes Father   . Hypertension Father       Prior to Admission medications   Medication Sig Start Date End Date Taking? Authorizing Provider  ondansetron (ZOFRAN) 4 MG tablet Take 1 tablet (4 mg total) by mouth every 8 (eight) hours as needed for nausea or vomiting. 12/20/17  Yes Kallie LocksStroud, Natalie  M, FNP  diphenhydrAMINE (BENADRYL) 25 MG tablet Take 25 mg by mouth every 6 (six) hours as needed for allergies.    [provider]  loratadine (CLARITIN) 10 MG tablet Take 10 mg by mouth daily as needed for allergies.    [provider]     Physical Exam: Vitals:   03/07/18 0938  BP: 109/69  Pulse: 75  Resp: 16  Temp: 98 F (36.7 C)  TempSrc: Oral  SpO2: 100%  Weight: 76.2 kg    General: Alert, awake, afebrile, anicteric, not in obvious distress HEENT: Normocephalic and Atraumatic, Mucous membranes pink                PERRLA; EOM intact; No scleral icterus,                 Nares: Patent, Oropharynx: Clear, Fair Dentition                 Neck: FROM, no cervical lymphadenopathy, thyromegaly, carotid bruit or JVD;  CHEST WALL: No tenderness  CHEST: Normal respiration, clear to auscultation bilaterally  HEART: Regular rate and rhythm; no murmurs rubs or gallops  BACK: No kyphosis or scoliosis; no CVA tenderness  ABDOMEN: Positive Bowel Sounds, soft, non-tender; no masses, no organomegaly EXTREMITIES: No cyanosis, clubbing, or edema SKIN:  no rash or ulceration  CNS: Alert and Oriented x 4, Nonfocal exam, CN 2-12 intact  Labs on Admission:  Basic Metabolic Panel: Recent Labs  Lab 03/07/18 1006  NA 138  K 4.0  CL 105  CO2 24  GLUCOSE 86  BUN 10  CREATININE 0.86  CALCIUM 9.8   Liver Function Tests: Recent Labs  Lab 03/07/18 1006  AST 10*  ALT 19  ALKPHOS 50  BILITOT 1.9*  PROT 8.2*  ALBUMIN 4.7   No results for input(s): LIPASE, AMYLASE in the last 168 hours. No results for input(s): AMMONIA in the last 168 hours. CBC: Recent Labs  Lab 03/07/18 1006  WBC 9.9  NEUTROABS 7.3  HGB 13.2  HCT 37.3*  MCV 77.7*  PLT 119*   Cardiac Enzymes: No results for input(s): CKTOTAL, CKMB, CKMBINDEX, TROPONINI in the last 168 hours.  BNP (last 3 results) No results for input(s): BNP in the last 8760 hours.  ProBNP (last 3 results) No results for  input(s): PROBNP in the last 8760 hours.  CBG: No results for input(s): GLUCAP in the last 168 hours.   Assessment/Plan Active Problems:   Sickle cell anemia with crisis (HCC)   Admits to the Day Hospital  IVF D5 .45% Saline @ 125 mls/hour  Weight based Dilaudid PCA started within 30 minutes of admission  IV Toradol 30 mg Q 6 H  Monitor vitals very closely, Re-evaluate pain scale every hour  2 L of Oxygen by [place]  Patient will be re-evaluated for pain in the context of function and relationship to baseline as care progresses.  If no significant relieve from pain (remains above 5/10) will transfer patient to inpatient services for further evaluation and management  Code Status: Full  Family Communication: None  DVT Prophylaxis: Ambulate as tolerated   Time spent: 35 Minutes  Jeanann Lewandowskylugbemiga Caige Almeda, MD, MHA, FACP, FAAP, CPE  If 7PM-7AM, please contact night-coverage [email or web address] 03/07/2018, 12:01 PM

## 2018-03-13 ENCOUNTER — Encounter: Payer: Self-pay | Admitting: Family Medicine

## 2018-03-13 ENCOUNTER — Ambulatory Visit (INDEPENDENT_AMBULATORY_CARE_PROVIDER_SITE_OTHER): Payer: 59 | Admitting: Family Medicine

## 2018-03-13 VITALS — BP 114/84 | HR 68 | Temp 97.4°F | Ht 67.0 in | Wt 164.0 lb

## 2018-03-13 DIAGNOSIS — G894 Chronic pain syndrome: Secondary | ICD-10-CM | POA: Diagnosis not present

## 2018-03-13 DIAGNOSIS — F1190 Opioid use, unspecified, uncomplicated: Secondary | ICD-10-CM | POA: Diagnosis not present

## 2018-03-13 DIAGNOSIS — D582 Other hemoglobinopathies: Secondary | ICD-10-CM | POA: Diagnosis not present

## 2018-03-13 DIAGNOSIS — Z23 Encounter for immunization: Secondary | ICD-10-CM

## 2018-03-13 DIAGNOSIS — Z09 Encounter for follow-up examination after completed treatment for conditions other than malignant neoplasm: Secondary | ICD-10-CM | POA: Diagnosis not present

## 2018-03-13 NOTE — Progress Notes (Signed)
Sickle Cell Follow Up  Subjective:    Patient ID: Austin [name], male    DOB: October 10, 1990, 27 y.o.   MRN: [id number]  Chief Complaint  Patient presents with  . Follow-up    fmla    HPI  [name] has a past history of Sickle Cell Anemia and Eczema. He is here today for follow up and to discuss FMLA paperwork.   Current Status: Since her last office visit, he is doing well with no complaints. He pain in controlled with pain medications.   He denies fevers, chills, fatigue, recent infections, weight loss, and night sweats. He has not had any headaches, visual changes, dizziness, and falls.   He has occasional shortness of breath on exertion. No chest pain, heart palpitations, cough reported.   No reports of GI problems such as nausea, vomiting, diarrhea, and constipation. He has no reports of blood in stools, dysuria and hematuria.   No depression or anxiety reported.  He denies pain today.   Past Medical History:  Diagnosis Date  . Eczema   . Sickle cell trait (HCC)     Family History  Problem Relation Age of Onset  . Sickle cell trait Mother   . Diabetes Father   . Hypertension Father     Social History   Socioeconomic History  . Marital status: Married    Spouse name: Not on file  . Number of children: Not on file  . Years of education: Not on file  . Highest education level: Not on file  Occupational History  . Not on file  Social Needs  . Financial resource strain: Not on file  . Food insecurity:    Worry: Not on file    Inability: Not on file  . Transportation needs:    Medical: Not on file    Non-medical: Not on file  Tobacco Use  . Smoking status: Former Smoker    Packs/day: 0.10  . Smokeless tobacco: Never Used  Substance and Sexual Activity  . Alcohol use: Yes    Comment: occ  . Drug use: Yes    Types: Marijuana  . Sexual activity: Not on file  Lifestyle  . Physical activity:    Days per week: Not on file    Minutes per session:  Not on file  . Stress: Not on file  Relationships  . Social connections:    Talks on phone: Not on file    Gets together: Not on file    Attends religious service: Not on file    Active member of club or organization: Not on file    Attends meetings of clubs or organizations: Not on file    Relationship status: Not on file  . Intimate partner violence:    Fear of current or ex partner: Not on file    Emotionally abused: Not on file    Physically abused: Not on file    Forced sexual activity: Not on file  Other Topics Concern  . Not on file  Social History Narrative  . Not on file    Past Surgical History:  Procedure Laterality Date  . WISDOM TOOTH EXTRACTION      Immunization History  Administered Date(s) Administered  . Influenza,inj,Quad PF,6+ Mos 03/13/2018    Current Meds  Medication Sig  . diphenhydrAMINE (BENADRYL) 25 MG tablet Take 25 mg by mouth every 6 (six) hours as needed for allergies.  Marland Kitchen. loratadine (CLARITIN) 10 MG tablet Take 10 mg by mouth daily as  needed for allergies.  Marland Kitchen. morphine (MSIR) 15 MG tablet Take 1 tablet (15 mg total) by mouth every 4 (four) hours as needed for up to 15 days for severe pain.  Marland Kitchen. ondansetron (ZOFRAN) 4 MG tablet Take 1 tablet (4 mg total) by mouth every 8 (eight) hours as needed for nausea or vomiting.    No Known Allergies  BP 114/84 (BP Location: Left Arm, Patient Position: Sitting, Cuff Size: Small)   Pulse 68   Temp (!) 97.4 F (36.3 C) (Oral)   Ht 5\' 7"  (1.702 m)   Wt 164 lb (74.4 kg)   SpO2 100%   BMI 25.69 kg/m   Review of Systems  Constitutional: Negative.   HENT: Negative.   Eyes: Negative.   Respiratory: Negative.   Cardiovascular: Negative.   Gastrointestinal: Negative.   Endocrine: Negative.   Genitourinary: Negative.   Musculoskeletal: Negative.   Skin: Negative.   Allergic/Immunologic: Negative.   Neurological: Negative.   Hematological: Negative.   Psychiatric/Behavioral: Negative.    Objective:    Physical Exam  Constitutional: He appears well-developed and well-nourished.  Cardiovascular: Normal rate, regular rhythm, normal heart sounds and intact distal pulses.  Pulmonary/Chest: Effort normal and breath sounds normal.  Abdominal: Soft. Bowel sounds are normal.  Psychiatric: He has a normal mood and affect. His behavior is normal. Judgment and thought content normal.  Vitals reviewed.  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 He is doing well today. He will continue to take pain medications as prescribed; will continue to avoid extreme heat and cold; will continue to eat a healthy diet and drink at least 64 ounces of water daily; will avoid colds and flu; will continue to get plenty of sleep and rest; will continue to avoid high stressful situations and remain infection free; will continue Folic Acid 1 mg daily to avoid sickle cell crisis.   We will complete FMLA forms as soon as possible.  3. Chronic, continuous use of opioids He will continue Morphine IR 15 mg as prescribed.   4. Need for immunization against influenza Administered in office today.  - Flu Vaccine QUAD 36+ mos IM  5. Follow up He will keep previously scheduled follow up appointment.   No orders of the defined types were placed in this encounter.   Raliegh IpNatalie Imagene Boss,  MSN, FNP-C Patient Care [facility] [address]  [place], [place] [id number] [phone]

## 2018-03-20 ENCOUNTER — Telehealth: Payer: Self-pay

## 2018-03-20 NOTE — Telephone Encounter (Signed)
Patient states that he took his Morphine at night and work up with a swollen lip. Patient is afraid to take the medication because he think he is having a reaction to it. Please advise

## 2018-03-31 ENCOUNTER — Other Ambulatory Visit: Payer: Self-pay | Admitting: Family Medicine

## 2018-03-31 DIAGNOSIS — G894 Chronic pain syndrome: Secondary | ICD-10-CM

## 2018-03-31 DIAGNOSIS — D582 Other hemoglobinopathies: Secondary | ICD-10-CM

## 2018-03-31 MED ORDER — MORPHINE SULFATE 15 MG PO TABS: 15.0000 mg | ORAL_TABLET | ORAL | 0 refills | Status: DC | PRN

## 2018-03-31 NOTE — Progress Notes (Signed)
Rx for MSIR sent to pharmacy today.  

## 2018-04-06 ENCOUNTER — Telehealth (HOSPITAL_COMMUNITY): Payer: Self-pay | Admitting: General Practice

## 2018-04-06 NOTE — Telephone Encounter (Signed)
Patient called, complained of pain in the lower back, sometimes feel "numb" that he rated as 10/10. Denied chest pain, fever, diarrhea, abdominal pain, nausea/vomitting. Admitted to driving himself here and back after treatment. Last took morphine 15 mg at 22:30 yesterday. Provider notified; per provider, there is no room available today, should go to the ER. Patient can call back anytime he needs to be seen. Patient also told to make arrangements for transportation whenever he is coming for treatment. Patient notified, verbalized understanding.

## 2018-04-13 ENCOUNTER — Emergency Department (HOSPITAL_COMMUNITY): Payer: 59

## 2018-04-13 ENCOUNTER — Encounter (HOSPITAL_COMMUNITY): Payer: Self-pay | Admitting: Emergency Medicine

## 2018-04-13 ENCOUNTER — Emergency Department (HOSPITAL_COMMUNITY)
Admission: EM | Admit: 2018-04-13 | Discharge: 2018-04-13 | Disposition: A | Discharge status: Home / Self Care | Payer: 59 | Attending: Emergency Medicine | Admitting: Emergency Medicine

## 2018-04-13 ENCOUNTER — Other Ambulatory Visit: Payer: Self-pay

## 2018-04-13 DIAGNOSIS — D571 Sickle-cell disease without crisis: Secondary | ICD-10-CM | POA: Diagnosis not present

## 2018-04-13 DIAGNOSIS — R0789 Other chest pain: Secondary | ICD-10-CM | POA: Diagnosis not present

## 2018-04-13 DIAGNOSIS — Z87891 Personal history of nicotine dependence: Secondary | ICD-10-CM | POA: Diagnosis not present

## 2018-04-13 DIAGNOSIS — D5700 Hb-SS disease with crisis, unspecified: Secondary | ICD-10-CM | POA: Diagnosis not present

## 2018-04-13 LAB — CBC WITH DIFFERENTIAL/PLATELET
BASOS PCT: 0 %
Basophils Absolute: 0 10*3/uL (ref 0.0–0.1)
EOS ABS: 0.1 10*3/uL (ref 0.0–0.7)
Eosinophils Relative: 2 %
HCT: 37.3 % — ABNORMAL LOW (ref 39.0–52.0)
Hemoglobin: 13.4 g/dL (ref 13.0–17.0)
Lymphocytes Relative: 25 %
Lymphs Abs: 1.8 10*3/uL (ref 0.7–4.0)
MCH: 27.9 pg (ref 26.0–34.0)
MCHC: 35.9 g/dL (ref 30.0–36.0)
MCV: 77.7 fL — ABNORMAL LOW (ref 78.0–100.0)
MONOS PCT: 11 %
Monocytes Absolute: 0.8 10*3/uL (ref 0.1–1.0)
NEUTROS PCT: 62 %
Neutro Abs: 4.7 10*3/uL (ref 1.7–7.7)
PLATELETS: 118 10*3/uL — AB (ref 150–400)
RBC: 4.8 MIL/uL (ref 4.22–5.81)
RDW: 14.8 % (ref 11.5–15.5)
WBC: 7.4 10*3/uL (ref 4.0–10.5)

## 2018-04-13 LAB — COMPREHENSIVE METABOLIC PANEL
ALK PHOS: 48 U/L (ref 38–126)
ALT: 20 U/L (ref 0–44)
AST: 10 U/L — ABNORMAL LOW (ref 15–41)
Albumin: 4.6 g/dL (ref 3.5–5.0)
Anion gap: 8 (ref 5–15)
BUN: 10 mg/dL (ref 6–20)
CALCIUM: 9.6 mg/dL (ref 8.9–10.3)
CO2: 27 mmol/L (ref 22–32)
CREATININE: 0.84 mg/dL (ref 0.61–1.24)
Chloride: 104 mmol/L (ref 98–111)
Glucose, Bld: 93 mg/dL (ref 70–99)
Potassium: 3.9 mmol/L (ref 3.5–5.1)
Sodium: 139 mmol/L (ref 135–145)
Total Bilirubin: 2.4 mg/dL — ABNORMAL HIGH (ref 0.3–1.2)
Total Protein: 7.9 g/dL (ref 6.5–8.1)

## 2018-04-13 LAB — RETICULOCYTES
RBC.: 4.8 MIL/uL (ref 4.22–5.81)
RETIC CT PCT: 3.5 % — AB (ref 0.4–3.1)
Retic Count, Absolute: 168 10*3/uL (ref 19.0–186.0)

## 2018-04-13 LAB — TROPONIN I: Troponin I: <0.03 ng/mL (ref <0.03)

## 2018-04-13 MED ORDER — KETOROLAC TROMETHAMINE 15 MG/ML IJ SOLN
15.0000 mg | INTRAMUSCULAR | Status: AC
  Administered 2018-04-13: 15 mg via INTRAVENOUS
  Filled 2018-04-13: qty 1

## 2018-04-13 MED ORDER — HYDROMORPHONE HCL 2 MG/ML IJ SOLN: 2.0000 mg | INTRAMUSCULAR | Status: AC

## 2018-04-13 MED ORDER — ONDANSETRON HCL 4 MG/2ML IJ SOLN: 4.0000 mg | INTRAMUSCULAR | Status: DC | PRN

## 2018-04-13 MED ORDER — HYDROMORPHONE HCL 2 MG/ML IJ SOLN
2.0000 mg | INTRAMUSCULAR | Status: AC
  Administered 2018-04-13: 2 mg via INTRAVENOUS
  Filled 2018-04-13: qty 1

## 2018-04-13 MED ORDER — DIPHENHYDRAMINE HCL 50 MG/ML IJ SOLN
25.0000 mg | Freq: Once | INTRAMUSCULAR | Status: AC
  Administered 2018-04-13: 25 mg via INTRAVENOUS
  Filled 2018-04-13: qty 1

## 2018-04-13 MED ORDER — DEXTROSE-NACL 5-0.45 % IV SOLN
INTRAVENOUS | Status: DC
  Administered 2018-04-13: 11:00:00 via INTRAVENOUS

## 2018-04-13 NOTE — Discharge Instructions (Signed)
Follow up with your primary care/sickle cell doctor.  If you develop worsening or new concerning symptoms you can return to the emergency department for re-evaluation.  See attached handouts.  Continue home medications as needed.

## 2018-04-13 NOTE — ED Provider Notes (Signed)
[facility]-EMERGENCY DEPT Provider Note   CSN: [id number] Arrival date & time: 04/13/18  [id number]     History   Chief Complaint Chief Complaint  Patient presents with  . Sickle Cell Pain Crisis  . Chest Pain    HPI Austin [name] is a 27 y.o. male with a history of sickle cell anemia who presents the emergent department today for sickle cell pain crisis and chest pain.  Patient reports that he began having his normal sickle cell pain proximately 2-3 days ago and his lower back.  He reports that at that time he also began having chest pain that was on the left side, achy in nature and intermittent.  He reports that it happens usually at rest and lasts for several minutes.  He denies any radiation of the pain to his neck, jaw, back, shoulder.  He denies any associated nausea, vomiting, diaphoresis, shortness of breath, fever, cough, lower leg swelling or hemoptysis.  Patient reports that he was afraid his morphine was causing his chest pain so he is not taking anything for his symptoms.  He had tried to go to the sickle cell clinic this morning but they were full so he presented here.  Patient denies any abdominal pain, diarrhea, urinary symptoms.  No bowel/bladder incontinence.  No trauma.  No numbness/tingling/weakness of the lower extremities.  He denies any other complaints at this time.  HPI  Past Medical History:  Diagnosis Date  . Eczema   . Sickle cell trait [facility](HCC)     Patient Active Problem List   Diagnosis Date Noted  . Acute sickle cell crisis (HCC) 12/15/2017  . Thrombocytopenia (HCC) 12/15/2017  . Splenomegaly 12/15/2017  . Sickle cell anemia with crisis (HCC) 12/15/2017  . 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 Spleen enlarged 08/18/2017    Past Surgical History:  Procedure Laterality Date  . WISDOM TOOTH EXTRACTION          Home Medications    Prior to Admission medications   Medication Sig Start Date End Date Taking? Authorizing  Provider  diphenhydrAMINE (BENADRYL) 25 MG tablet Take 25 mg by mouth every 6 (six) hours as needed for allergies.   Yes [provider]  loratadine (CLARITIN) 10 MG tablet Take 10 mg by mouth daily as needed for allergies.   Yes [provider]  morphine (MSIR) 15 MG tablet Take 1 tablet (15 mg total) by mouth every 4 (four) hours as needed for up to 15 days for severe pain. 03/31/18 04/15/18 Yes Kallie LocksStroud, Natalie M, FNP  ondansetron (ZOFRAN) 4 MG tablet Take 1 tablet (4 mg total) by mouth every 8 (eight) hours as needed for nausea or vomiting. 12/20/17  Yes Kallie LocksStroud, Natalie M, FNP    Family History Family History  Problem Relation Age of Onset  . Sickle cell trait Mother   . Diabetes Father   . Hypertension Father     Social History Social History   Tobacco Use  . Smoking status: Former Smoker    Packs/day: 0.10  . Smokeless tobacco: Never Used  Substance Use Topics  . Alcohol use: Yes    Comment: occ  . Drug use: Yes    Types: Marijuana     Allergies   Patient has no known allergies.   Review of Systems Review of Systems  All other systems reviewed and are negative.    Physical Exam Updated Vital Signs BP 115/80 (BP Location: Left Arm)   Pulse 77  Temp 98.4 F (36.9 C) (Oral)   Resp 18   Ht 5\' 8"  (1.727 m)   Wt 76.2 kg   SpO2 98%   BMI 25.54 kg/m   Physical Exam  Constitutional: He appears well-developed and well-nourished.  HENT:  Head: Normocephalic and atraumatic.  Right Ear: External ear normal.  Left Ear: External ear normal.  Nose: Nose normal.  Mouth/Throat: Uvula is midline, oropharynx is clear and moist and mucous membranes are normal. No tonsillar exudate.  Eyes: Pupils are equal, round, and reactive to light. Right eye exhibits no discharge. Left eye exhibits no discharge. No scleral icterus.  Neck: Trachea normal. Neck supple. No spinous process tenderness present. No neck rigidity. Normal range of motion present.    Cardiovascular: Normal rate, regular rhythm and intact distal pulses.  No murmur heard. Pulses:      Radial pulses are 2+ on the right side, and 2+ on the left side.       Dorsalis pedis pulses are 2+ on the right side, and 2+ on the left side.       Posterior tibial pulses are 2+ on the right side, and 2+ on the left side.  No lower extremity swelling or edema. Calves symmetric in size bilaterally.  Pulmonary/Chest: Effort normal and breath sounds normal. He exhibits tenderness.  Abdominal: Soft. Bowel sounds are normal. There is no tenderness. There is no rebound and no guarding.  Musculoskeletal: He exhibits no edema.       Lumbar back: He exhibits no bony tenderness.       Back:  Lymphadenopathy:    He has no cervical adenopathy.  Neurological: He is alert. He has normal strength. No sensory deficit.  Speech clear. Follows commands. No facial droop. PERRLA. EOM grossly intact. CN III-XII grossly intact. Grossly moves all extremities 4 without ataxia. Able and appropriate strength for age to upper and lower extremities bilaterally  Skin: Skin is warm and dry. Capillary refill takes less than 2 seconds. No rash noted. He is not diaphoretic.  Psychiatric: He has a normal mood and affect.  Nursing note and vitals reviewed.    ED Treatments / Results  Labs (all labs ordered are listed, but only abnormal results are displayed) Labs Reviewed  COMPREHENSIVE METABOLIC PANEL - Abnormal; Notable for the following components:      Result Value   AST 10 (*)    Total Bilirubin 2.4 (*)    All other components within normal limits  CBC WITH DIFFERENTIAL/PLATELET - Abnormal; Notable for the following components:   HCT 37.3 (*)    MCV 77.7 (*)    Platelets 118 (*)    All other components within normal limits  RETICULOCYTES - Abnormal; Notable for the following components:   Retic Ct Pct 3.5 (*)    All other components within normal limits  TROPONIN I    EKG EKG  Interpretation  Date/Time:  Thursday April 13 2018 09:21:09 EDT Ventricular Rate:  75 PR Interval:    QRS Duration: 76 QT Interval:  360 QTC Calculation: 402 R Axis:   78 Text Interpretation:  Unknown rhythm, irregular rate Inferior infarct, acute (RCA) Early repolarization pattern Artifact No significant change since last tracing Abnormal ekg Confirmed by Gerhard Munch [phone]) on 04/13/2018 9:25:07 AM   Radiology Dg Chest 2 View  Result Date: 04/13/2018 CLINICAL DATA:  Sickle cell anemia. Chest and left back pain for 2 days. Former smoker of tobacco. Current marijuana smoker. EXAM: CHEST - 2 VIEW COMPARISON:  Chest x-ray of Nov 24, 2016 FINDINGS: The lungs are adequately inflated. The interstitial markings are coarse. The heart and pulmonary vascularity are normal. The mediastinum is normal in width. There is no pleural effusion. The bony thorax exhibits no acute abnormality. IMPRESSION: Mild chronic interstitial prominence consistent with the smoking history as well as sickle cell disease. There is no active cardiopulmonary disease. Electronically Signed   By: David  [place] M.D.   On: 04/13/2018 10:13    Procedures Procedures (including critical care time)  Medications Ordered in ED Medications  dextrose 5 %-0.45 % sodium chloride infusion ( Intravenous Stopped 04/13/18 1318)  HYDROmorphone (DILAUDID) injection 2 mg (has no administration in time range)    Or  HYDROmorphone (DILAUDID) injection 2 mg (has no administration in time range)  ondansetron (ZOFRAN) injection 4 mg (has no administration in time range)  ketorolac (TORADOL) 15 MG/ML injection 15 mg (15 mg Intravenous Given 04/13/18 1040)  HYDROmorphone (DILAUDID) injection 2 mg (2 mg Intravenous Given 04/13/18 1040)    Or  HYDROmorphone (DILAUDID) injection 2 mg ( Subcutaneous See Alternative 04/13/18 1040)  HYDROmorphone (DILAUDID) injection 2 mg (2 mg Intravenous Given 04/13/18 1124)    Or  HYDROmorphone (DILAUDID)  injection 2 mg ( Subcutaneous See Alternative 04/13/18 1124)  diphenhydrAMINE (BENADRYL) injection 25 mg (25 mg Intravenous Given 04/13/18 1039)     Initial Impression / Assessment and Plan / ED Course  I have reviewed the triage vital signs and the nursing notes.  Pertinent labs & imaging results that were available during my care of the patient were reviewed by me and considered in my medical decision making (see chart for details).     27 y.o. male with a history of sickle cell anemia who presents emergency department today for sickle cell pain crisis and chest pain.  Patient reports this is his typical sickle cell pain.  He notes however he does have associated chest pain with this.  Patient's chest pain is reproducible on palpation.  He denies any associated exertional chest pain.  No associated nausea, diaphoresis, shortness of breath, cough, fever or radiation of the pain.  Chest x-ray is without evidence of pneumonia or acute chest.  EKG without any acute changes.  Troponin within normal limits.  Do not suspect ACS.  Lab work reviewed and reassuring.  Patient given sickle cell pain protocol per order set.  After 2 doses dilaudid, patients pain was relieved and he felt comfortable for discharge.  He states he is currently asymptomatic. I advised the patient to follow-up with PCP/Sickle Cell doctor early next week. He is to continue home medications as needed for pain. Specific return precautions discussed. Time was given for all questions to be answered. The patient verbalized understanding and agreement with plan. The patient appears safe for discharge home  Final Clinical Impressions(s) / ED Diagnoses   Final diagnoses:  Sickle cell pain crisis [facility])  Atypical chest pain    ED Discharge Orders    None       Princella Pellegrini 04/13/18 1445    Jacalyn Lefevre, MD 04/13/18 1501

## 2018-04-13 NOTE — ED Notes (Signed)
Pt has episode of emesis. Pt denies nausea at this time. Pt reports  0/10 pain at this time.

## 2018-04-13 NOTE — ED Triage Notes (Signed)
Pt c/o sickle cell pain in chest and back on left lower side for couple days. Repots that he hasnt been taking his Morphine lately due to thinking causing chest pains to hurt worse and been using marijuana

## 2018-04-13 NOTE — ED Notes (Signed)
Pt is alert and orinted x 4 and is verbally responsive. PT reports  6/10 back pain at this time. Second dose of dilaudid has been given.

## 2018-04-17 ENCOUNTER — Encounter: Payer: Self-pay | Admitting: Family Medicine

## 2018-04-17 ENCOUNTER — Ambulatory Visit (INDEPENDENT_AMBULATORY_CARE_PROVIDER_SITE_OTHER): Payer: 59 | Admitting: Family Medicine

## 2018-04-17 VITALS — BP 106/64 | HR 84 | Temp 98.1°F | Ht 68.0 in | Wt 163.8 lb

## 2018-04-17 DIAGNOSIS — D582 Other hemoglobinopathies: Secondary | ICD-10-CM

## 2018-04-17 DIAGNOSIS — F1190 Opioid use, unspecified, uncomplicated: Secondary | ICD-10-CM | POA: Diagnosis not present

## 2018-04-17 DIAGNOSIS — Z09 Encounter for follow-up examination after completed treatment for conditions other than malignant neoplasm: Secondary | ICD-10-CM | POA: Diagnosis not present

## 2018-04-17 LAB — POCT URINALYSIS DIP (MANUAL ENTRY)
Blood, UA: NEGATIVE
Glucose, UA: NEGATIVE mg/dL
Ketones, POC UA: NEGATIVE mg/dL
Leukocytes, UA: NEGATIVE
Nitrite, UA: NEGATIVE
Protein Ur, POC: NEGATIVE mg/dL
Spec Grav, UA: >=1.03 — AB (ref 1.010–1.025)
Urobilinogen, UA: 1 E.U./dL
pH, UA: 5.5 (ref 5.0–8.0)

## 2018-04-17 MED ORDER — FOLIC ACID 1 MG PO TABS: 1.0000 mg | ORAL_TABLET | Freq: Every day | ORAL | 11 refills | Status: DC

## 2018-04-17 NOTE — Progress Notes (Signed)
Sickle Cell Anemia Hospital Follow Up  Subjective:    Patient ID: Austin [name], male    DOB: [birth year], 27 y.o.   MRN: [id number]   Chief Complaint  Patient presents with  . Follow-up    Sickle cell/medication management   HPI Austin [name] is a [age] year male with a past medical history of Sickle Cell Anemia, and Eczema. He is here today for Hospital Follow Up.   Current Status: Since his last office visit, he has had a ED visit for Sickle Cell Crisis. He states that he has pain in his arms and legs. He rates  pain today at 5/10. He has not has a hospital visit for Sickle Cell Crisis since 04/13/2018 where he was treated and discharged the same day. He is currently taking all medications as prescribed and staying well hydrated. He reports occasional dizziness and headaches.   He reports pain in his right upper arm where his peripheral IV was placed, since his last ED visit on 04/13/2018.   He states that he needs additional paperwork for FMLA benefits today.   Since his last office visit, he is doing well with no complaints. He denies fevers, chills, fatigue, recent infections, weight loss, and night sweats. He has not had any headaches, visual changes, dizziness, and falls. No chest pain, heart palpitations, cough and shortness of breath reported. No reports of GI problems such as nausea, vomiting, diarrhea, and constipation. She has no reports of blood in stools, dysuria and hematuria. No depression or anxiety report.   Review of Systems  Respiratory: Negative.   Cardiovascular: Negative.   Gastrointestinal: Negative.   Genitourinary: Negative.   Musculoskeletal: Positive for arthralgias (upper right arm).  Psychiatric/Behavioral: Negative.    Objective:   Physical Exam  Constitutional: He is oriented to person, place, and time.  Cardiovascular: Normal rate, regular rhythm, normal heart sounds and intact distal pulses.  Pulmonary/Chest: Effort normal and breath sounds normal.   Neurological: He is alert and oriented to person, place, and time.  Psychiatric: He has a normal mood and affect. His behavior is normal. Judgment and thought content normal.   Assessment & Plan:   1. 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 He will continue to take pain medications as prescribed; will continue to avoid extreme heat and cold; will continue to eat a healthy diet and drink at least 64 ounces of water daily; continue stool softener as needed; will avoid colds and flu; will continue to get plenty of sleep and rest; will continue to avoid high stressful situations and remain infection free; will continue Folic Acid 1 mg daily to avoid sickle cell crisis.  - folic acid (FOLVITE) 1 MG tablet; Take 1 tablet (1 mg total) by mouth daily.  Dispense: 30 tablet; Refill: 11  2. Chronic, continuous use of opioids His pain is stable today.  3. Follow up He will follow up in 2 months.  - POCT urinalysis dipstick   Meds ordered this encounter  Medications  . folic acid (FOLVITE) 1 MG tablet    Sig: Take 1 tablet (1 mg total) by mouth daily.    Dispense:  30 tablet    Refill:  11   Austin IpNatalie Orene Abbasi,  MSN, [facility] Center[facility]NP-C [facility] [address]  [place], [place] [id number] [phone]

## 2018-04-19 ENCOUNTER — Ambulatory Visit (INDEPENDENT_AMBULATORY_CARE_PROVIDER_SITE_OTHER): Payer: 59 | Admitting: Family Medicine

## 2018-04-19 ENCOUNTER — Encounter: Payer: Self-pay | Admitting: Family Medicine

## 2018-04-19 ENCOUNTER — Ambulatory Visit (HOSPITAL_COMMUNITY)
Admission: RE | Admit: 2018-04-19 | Discharge: 2018-04-19 | Disposition: A | Discharge status: Home / Self Care | Payer: 59 | Source: Ambulatory Visit | Attending: Family Medicine | Admitting: Family Medicine

## 2018-04-19 VITALS — BP 118/80 | HR 78 | Temp 97.9°F | Ht 68.0 in | Wt 161.0 lb

## 2018-04-19 DIAGNOSIS — M79601 Pain in right arm: Secondary | ICD-10-CM | POA: Diagnosis present

## 2018-04-19 DIAGNOSIS — Z09 Encounter for follow-up examination after completed treatment for conditions other than malignant neoplasm: Secondary | ICD-10-CM | POA: Diagnosis not present

## 2018-04-19 DIAGNOSIS — M7989 Other specified soft tissue disorders: Secondary | ICD-10-CM | POA: Diagnosis not present

## 2018-04-19 DIAGNOSIS — D571 Sickle-cell disease without crisis: Secondary | ICD-10-CM | POA: Diagnosis not present

## 2018-04-19 DIAGNOSIS — R937 Abnormal findings on diagnostic imaging of other parts of musculoskeletal system: Secondary | ICD-10-CM | POA: Diagnosis not present

## 2018-04-19 DIAGNOSIS — S59911A Unspecified injury of right forearm, initial encounter: Secondary | ICD-10-CM | POA: Diagnosis not present

## 2018-04-19 MED ORDER — IBUPROFEN 800 MG PO TABS: 800.0000 mg | ORAL_TABLET | Freq: Three times a day (TID) | ORAL | 1 refills | Status: DC | PRN

## 2018-04-19 MED FILL — IBUPROFEN 800 MG TAB: 800 | 10 days supply | Qty: 30 | Fill #0

## 2018-04-19 NOTE — Progress Notes (Signed)
Sickle Cell Sick Visit  Subjective:    Patient ID: Austin [name], male    DOB: 09/11/1990, 27 y.o.   MRN: [id number]   Chief Complaint  Patient presents with  . Arm Pain    right  . Shoulder Pain    HPI Austin [name] is a 27 year old male with a past medical history of Sickle Cell Anemia and Eczema. He is here for Sick Visit today.   Current Status: Since his last office visit, he has continued to have increased pain in his right upper arm, post d/c his last peripheral IV. He states that he has pain in his arm has worsens and it now radiates to his right fingers and righ shoulder and shoulder blades. He rates his Sickle pain today at 5/10. He is currently taking all medications as prescribed and staying well hydrated. He reports occasional dizziness and headaches.   He denies fevers, chills, fatigue, recent infections, weight loss, and night sweats. He has not had any visual changes, and falls. No chest pain, heart palpitations, cough and shortness of breath reported. No reports of GI problems such as nausea, vomiting, diarrhea, and constipation. He has no reports of blood in stools, dysuria and hematuria. No depression or anxiety reported.   Past Medical History:  Diagnosis Date  . Eczema   . Sickle cell anemia (HCC)     Family History  Problem Relation Age of Onset  . Sickle cell trait Mother   . Diabetes Father   . Hypertension Father     Social History   Socioeconomic History  . Marital status: Married    Spouse name: Not on file  . Number of children: Not on file  . Years of education: Not on file  . Highest education level: Not on file  Occupational History  . Not on file  Social Needs  . Financial resource strain: Not on file  . Food insecurity:    Worry: Not on file    Inability: Not on file  . Transportation needs:    Medical: Not on file    Non-medical: Not on file  Tobacco Use  . Smoking status: Former Smoker    Packs/day: 0.10  . Smokeless  tobacco: Never Used  Substance and Sexual Activity  . Alcohol use: Yes    Comment: occ  . Drug use: Yes    Types: Marijuana  . Sexual activity: Not on file  Lifestyle  . Physical activity:    Days per week: Not on file    Minutes per session: Not on file  . Stress: Not on file  Relationships  . Social connections:    Talks on phone: Not on file    Gets together: Not on file    Attends religious service: Not on file    Active member of club or organization: Not on file    Attends meetings of clubs or organizations: Not on file    Relationship status: Not on file  . Intimate partner violence:    Fear of current or ex partner: Not on file    Emotionally abused: Not on file    Physically abused: Not on file    Forced sexual activity: Not on file  Other Topics Concern  . Not on file  Social History Narrative  . Not on file    Past Surgical History:  Procedure Laterality Date  . WISDOM TOOTH EXTRACTION      Immunization History  Administered Date(s) Administered  . Influenza,inj,Quad  PF,6+ Mos 03/13/2018   Current Meds  Medication Sig  . diphenhydrAMINE (BENADRYL) 25 MG tablet Take 25 mg by mouth every 6 (six) hours as needed for allergies.  . folic acid (FOLVITE) 1 MG tablet Take 1 tablet (1 mg total) by mouth daily.  . ondansetron (ZOFRAN) 4 MG tablet Take 1 tablet (4 mg total) by mouth every 8 (eight) hours as needed for nausea or vomiting.    No Known Allergies  BP 118/80 (BP Location: Left Arm, Patient Position: Sitting, Cuff Size: Small)   Pulse 78   Temp 97.9 F (36.6 C) (Oral)   Ht 5\' 8"  (1.727 m)   Wt 161 lb (73 kg)   SpO2 100%   BMI 24.48 kg/m    Review of Systems  Respiratory: Negative.   Cardiovascular: Negative.   Musculoskeletal:       Increasing pain in right upper arm   Objective:   Physical Exam  Cardiovascular: Normal rate, regular rhythm, normal heart sounds and intact distal pulses.  Pulmonary/Chest: Effort normal and breath sounds  normal.  Musculoskeletal: He exhibits tenderness (right upper arm extremity ).   Assessment & Plan:   1. Right arm pain Right arm pain is increasing. We will send patient for scan of right forearm.  - DG Forearm Right; Future  2. Sickle cell anemia without crisis He is not having any sickle cell pain today. He is doing well today. He will continue to take pain medications as prescribed; will continue to avoid extreme heat and cold; will continue to eat a healthy diet and drink at least 64 ounces of water daily; continue stool softener as needed; will avoid colds and flu; will continue to get plenty of sleep and rest; will continue to avoid high stressful situations and remain infection free; will continue Folic Acid 1 mg daily to avoid sickle cell crisis.   3. Follow up He will follow up for management of Sickle Cell Disease on 05/2018.   No orders of the defined types were placed in this encounter.  [name],  MSN, FNP-C [facility] [address] [place], [place] [address] [phone]

## 2018-04-19 NOTE — Patient Instructions (Signed)
Ibuprofen tablets and capsules What is this medicine? IBUPROFEN (eye BYOO proe fen) is a non-steroidal anti-inflammatory drug (NSAID). It is used for dental pain, fever, headaches or migraines, osteoarthritis, rheumatoid arthritis, or painful monthly periods. It can also relieve minor aches and pains caused by a cold, flu, or sore throat. This medicine may be used for other purposes; ask your health care provider or pharmacist if you have questions. COMMON BRAND NAME(S): Advil, Advil Junior Strength, Advil Migraine, Genpril, Ibren, IBU, Midol, Midol Cramps and Body Aches, Motrin, Motrin IB, Motrin Junior Strength, Motrin Migraine Pain, Samson-8, Toxicology Saliva Collection What should I tell my health care provider before I take this medicine? They need to know if you have any of these conditions: -asthma -cigarette smoker -drink more than 3 alcohol containing drinks a day -heart disease or circulation problems such as heart failure or leg edema (fluid retention) -high blood pressure -kidney disease -liver disease -stomach bleeding or ulcers -an unusual or allergic reaction to ibuprofen, aspirin, other NSAIDS, other medicines, foods, dyes, or preservatives -pregnant or trying to get pregnant -breast-feeding How should I use this medicine? Take this medicine by mouth with a glass of water. Follow the directions on the prescription label. Take this medicine with food if your stomach gets upset. Try to not lie down for at least 10 minutes after you take the medicine. Take your medicine at regular intervals. Do not take your medicine more often than directed. A special MedGuide will be given to you by the pharmacist with each prescription and refill. Be sure to read this information carefully each time. Talk to your pediatrician regarding the use of this medicine in children. Special care may be needed. Overdosage: If you think you have taken too much of this medicine contact a poison control  center or emergency room at once. NOTE: This medicine is only for you. Do not share this medicine with others. What if I miss a dose? If you miss a dose, take it as soon as you can. If it is almost time for your next dose, take only that dose. Do not take double or extra doses. What may interact with this medicine? Do not take this medicine with any of the following medications: -cidofovir -ketorolac -methotrexate -pemetrexed This medicine may also interact with the following medications: -alcohol -aspirin -diuretics -lithium -other drugs for inflammation like prednisone -warfarin This list may not describe all possible interactions. Give your health care provider a list of all the medicines, herbs, non-prescription drugs, or dietary supplements you use. Also tell them if you smoke, drink alcohol, or use illegal drugs. Some items may interact with your medicine. What should I watch for while using this medicine? Tell your doctor or healthcare professional if your symptoms do not start to get better or if they get worse. This medicine does not prevent heart attack or stroke. In fact, this medicine may increase the chance of a heart attack or stroke. The chance may increase with longer use of this medicine and in people who have heart disease. If you take aspirin to prevent heart attack or stroke, talk with your doctor or health care professional. Do not take other medicines that contain aspirin, ibuprofen, or naproxen with this medicine. Side effects such as stomach upset, nausea, or ulcers may be more likely to occur. Many medicines available without a prescription should not be taken with this medicine. This medicine can cause ulcers and bleeding in the stomach and intestines at any time during treatment. Ulcers   and bleeding can happen without warning symptoms and can cause death. To reduce your risk, do not smoke cigarettes or drink alcohol while you are taking this medicine. You may get  drowsy or dizzy. Do not drive, use machinery, or do anything that needs mental alertness until you know how this medicine affects you. Do not stand or sit up quickly, especially if you are an older patient. This reduces the risk of dizzy or fainting spells. This medicine can cause you to bleed more easily. Try to avoid damage to your teeth and gums when you brush or floss your teeth. This medicine may be used to treat migraines. If you take migraine medicines for 10 or more days a month, your migraines may get worse. Keep a diary of headache days and medicine use. Contact your healthcare professional if your migraine attacks occur more frequently. What side effects may I notice from receiving this medicine? Side effects that you should report to your doctor or health care professional as soon as possible: -allergic reactions like skin rash, itching or hives, swelling of the face, lips, or tongue -severe stomach pain -signs and symptoms of bleeding such as bloody or black, tarry stools; red or dark-brown urine; spitting up blood or brown material that looks like coffee grounds; red spots on the skin; unusual bruising or bleeding from the eye, gums, or nose -signs and symptoms of a blood clot such as changes in vision; chest pain; severe, sudden headache; trouble speaking; sudden numbness or weakness of the face, arm, or leg -unexplained weight gain or swelling -unusually weak or tired -yellowing of eyes or skin Side effects that usually do not require medical attention (report to your doctor or health care professional if they continue or are bothersome): -bruising -diarrhea -dizziness, drowsiness -headache -nausea, vomiting This list may not describe all possible side effects. Call your doctor for medical advice about side effects. You may report side effects to FDA at 1-800-FDA-1088. Where should I keep my medicine? Keep out of the reach of children. Store at room temperature between 15 and 30  degrees C (59 and 86 degrees F). Keep container tightly closed. Throw away any unused medicine after the expiration date. NOTE: This sheet is a summary. It may not cover all possible information. If you have questions about this medicine, talk to your doctor, pharmacist, or health care provider.  2018 Elsevier/Gold Standard (2013-03-13 10:48:02)  

## 2018-04-26 ENCOUNTER — Other Ambulatory Visit: Payer: Self-pay

## 2018-04-26 ENCOUNTER — Encounter (HOSPITAL_COMMUNITY): Payer: Self-pay | Admitting: Emergency Medicine

## 2018-04-26 DIAGNOSIS — M79601 Pain in right arm: Secondary | ICD-10-CM | POA: Insufficient documentation

## 2018-04-26 DIAGNOSIS — Z79899 Other long term (current) drug therapy: Secondary | ICD-10-CM | POA: Diagnosis not present

## 2018-04-26 DIAGNOSIS — M25521 Pain in right elbow: Secondary | ICD-10-CM | POA: Diagnosis present

## 2018-04-26 DIAGNOSIS — R202 Paresthesia of skin: Secondary | ICD-10-CM | POA: Diagnosis not present

## 2018-04-26 DIAGNOSIS — Z87891 Personal history of nicotine dependence: Secondary | ICD-10-CM | POA: Insufficient documentation

## 2018-04-26 NOTE — ED Triage Notes (Signed)
Patient reports x3 weeks ago he was seen and got IV in right arm following lab draw. States since that time he has had pain and tingling to right hand. States he was seen for same x1 week ago. Full movement and sensation to right hand in triage.

## 2018-04-27 ENCOUNTER — Emergency Department (HOSPITAL_COMMUNITY)
Admission: EM | Admit: 2018-04-27 | Discharge: 2018-04-27 | Disposition: A | Discharge status: Home / Self Care | Payer: 59 | Attending: Emergency Medicine | Admitting: Emergency Medicine

## 2018-04-27 ENCOUNTER — Ambulatory Visit (HOSPITAL_BASED_OUTPATIENT_CLINIC_OR_DEPARTMENT_OTHER)
Admission: RE | Admit: 2018-04-27 | Discharge: 2018-04-27 | Disposition: A | Discharge status: Home / Self Care | Payer: 59 | Source: Ambulatory Visit | Attending: Physician Assistant | Admitting: Physician Assistant

## 2018-04-27 ENCOUNTER — Emergency Department (HOSPITAL_COMMUNITY): Payer: 59

## 2018-04-27 DIAGNOSIS — R202 Paresthesia of skin: Secondary | ICD-10-CM

## 2018-04-27 DIAGNOSIS — Z79899 Other long term (current) drug therapy: Secondary | ICD-10-CM | POA: Diagnosis not present

## 2018-04-27 DIAGNOSIS — R52 Pain, unspecified: Secondary | ICD-10-CM

## 2018-04-27 DIAGNOSIS — Z87891 Personal history of nicotine dependence: Secondary | ICD-10-CM | POA: Diagnosis not present

## 2018-04-27 DIAGNOSIS — M79601 Pain in right arm: Secondary | ICD-10-CM | POA: Diagnosis not present

## 2018-04-27 DIAGNOSIS — M25521 Pain in right elbow: Secondary | ICD-10-CM | POA: Diagnosis not present

## 2018-04-27 NOTE — ED Notes (Signed)
Pt states that he's been seen for this two other times since he had the IV in his arm three weeks ago, he states that he can't perform his job and he's unable to straighten his arm

## 2018-04-27 NOTE — Progress Notes (Signed)
Right upper extremity venous duplex has been completed. Negative for DVT. There is evidence of acute superficial vein thrombosis involving the cephalic vein of the right upper extremity.  04/27/18 9:12 AM Olen Cordial RVT

## 2018-04-27 NOTE — Discharge Instructions (Signed)
IMPORTANT PATIENT INSTRUCTIONS:  You have been scheduled for an Outpatient Vascular Study at [facility].    If tomorrow is a Saturday, Sunday or holiday, please go to the [facility] Emergency Department Registration Desk at 8 am tomorrow morning and tell them you are there for a vascular study.  If tomorrow is a weekday (Monday-Friday), please go to [facility] Admitting Department at 8 am and tell them you are there for a vascular study.  I also placed a referral to [facility] for you to receive a electromyographic study.  This is to measure the conduction in your nerves.  Please return the emergency department for any discoloration of the arm, swelling, increasing pain, or fever or redness of the arm.

## 2018-04-27 NOTE — ED Notes (Signed)
Pt. mumbling due to wait time for physician using both hands to play on his phone.

## 2018-04-27 NOTE — ED Provider Notes (Signed)
[facility]   CSN: [id number] Arrival date & time: 04/26/18  2236     History   Chief Complaint Chief Complaint  Patient presents with  . Hand Pain    HPI Austin [name] is a 27 y.o. male.  HPI  Patient is a 27 year old male with a history of sickle cell anemia (hemoglobin C-C disease) and eczema presenting for right arm pain.  Patient reports that he has pain in the medial right elbow, shooting down the medial aspect of his right upper extremity, as well as shooting upwards to his right shoulder.  Patient reports that his pain began 2 weeks ago when he had an IV started for sickle cell pain crisis.  He reports that he believes that this caused "nerve damage" to his right upper extremity.  Patient reports he believes his palm has been discolored at times, but not at present.  Patient also reports numbness that affects primarily the fourth and fifth digits of his right hand, but affects all digits at times.  Patient denies any fever, chills, erythema, or swelling of the right upper extremity.  Patient was prescribed ibuprofen by his primary care provider who has evaluated him for the symptoms.  Past Medical History:  Diagnosis Date  . Eczema   . Sickle cell anemia [facility])     Patient Active Problem List   Diagnosis Date Noted  . Acute sickle cell crisis (HCC) 12/15/2017  . Thrombocytopenia (HCC) 12/15/2017  . Splenomegaly 12/15/2017  . Sickle cell anemia with crisis (HCC) 12/15/2017  . 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 Spleen enlarged 08/18/2017    Past Surgical History:  Procedure Laterality Date  . WISDOM TOOTH EXTRACTION          Home Medications    Prior to Admission medications   Medication Sig Start Date End Date Taking? Authorizing Provider  diphenhydrAMINE (BENADRYL) 25 MG tablet Take 25 mg by mouth every 6 (six) hours as needed for allergies.    [provider]  folic acid (FOLVITE) 1 MG  tablet Take 1 tablet (1 mg total) by mouth daily. 04/17/18   Kallie Locks, FNP  ibuprofen (ADVIL,MOTRIN) 800 MG tablet Take 1 tablet (800 mg total) by mouth every 8 (eight) hours as needed. 04/19/18   Kallie Locks, FNP  ondansetron (ZOFRAN) 4 MG tablet Take 1 tablet (4 mg total) by mouth every 8 (eight) hours as needed for nausea or vomiting. 12/20/17   Kallie Locks, FNP    Family History Family History  Problem Relation Age of Onset  . Sickle cell trait Mother   . Diabetes Father   . Hypertension Father     Social History Social History   Tobacco Use  . Smoking status: Former Smoker    Packs/day: 0.10  . Smokeless tobacco: Never Used  Substance Use Topics  . Alcohol use: Yes    Comment: occ  . Drug use: Yes    Types: Marijuana     Allergies   Patient has no known allergies.   Review of Systems Review of Systems  Constitutional: Negative for chills and fever.  Musculoskeletal: Positive for arthralgias and myalgias. Negative for joint swelling.  Skin: Positive for color change.  Neurological: Positive for numbness. Negative for weakness.     Physical Exam Updated Vital Signs BP 117/78   Pulse 67   Temp 98.3 F (36.8 C) (Oral)   Resp 16   Ht 5\' 8"  (1.727 m)  Wt 73 kg   SpO2 100%   BMI 24.47 kg/m   Physical Exam  Constitutional: He appears well-developed and well-nourished. No distress.  Sitting comfortably in bed.  HENT:  Head: Normocephalic and atraumatic.  Eyes: Conjunctivae are normal. Right eye exhibits no discharge. Left eye exhibits no discharge.  EOMs normal to gross examination.  Neck: Normal range of motion.  Cardiovascular: Normal rate and regular rhythm.  Intact, 2+ radial and ulnar pulse of RUE.   Pulmonary/Chest:  Normal respiratory effort. Patient converses comfortably. No audible wheeze or stridor.  Abdominal: He exhibits no distension.  Musculoskeletal: Normal range of motion.  RUE Examination: No discoloration, erythema,  or cyanosis of the right upper extremity.  No swelling appreciated.  Patient has tenderness to palpation of the cubital tunnel, and has positive Tinel sign at the elbow.  No tenderness to palpation of distal radius or ulna.  Patient reports subjective loss of sensation in the fourth and fifth digit of the right hand.  Full range of motion of the right upper extremity.  2+ radial ulnar pulse of the right upper extremity.  Cap refill less than 2 seconds.  Neurological: He is alert.  Cranial nerves intact to gross observation. Patient moves extremities without difficulty.  Skin: Skin is warm and dry. He is not diaphoretic.  Psychiatric: He has a normal mood and affect. His behavior is normal. Judgment and thought content normal.  Nursing note and vitals reviewed.    ED Treatments / Results  Labs (all labs ordered are listed, but only abnormal results are displayed) Labs Reviewed - No data to display  EKG None  Radiology Dg Elbow Complete Right  Result Date: 04/27/2018 CLINICAL DATA:  27 y/o M; IV in the right elbow 3 weeks ago. Elbow pain radiating to the shoulder. EXAM: RIGHT ELBOW - COMPLETE 3+ VIEW COMPARISON:  04/19/2018 right upper extremity radiograph FINDINGS: There is no evidence of fracture, dislocation, or joint effusion. There is no evidence of arthropathy or other focal bone abnormality. IMPRESSION: No acute bony or articular abnormality. Electronically Signed   By: Mitzi Hansen M.D.   On: 04/27/2018 03:47    Procedures Procedures (including critical care time)  Medications Ordered in ED Medications - No data to display   Initial Impression / Assessment and Plan / ED Course  I have reviewed the triage vital signs and the nursing notes.  Pertinent labs & imaging results that were available during my care of the patient were reviewed by me and considered in my medical decision making (see chart for details).     Patient is nontoxic-appearing, and vascularly  intact in the right upper extreme, but reporting subjective loss of sensation in the fourth and fifth digit.  Differential diagnosis includes right upper extremity DVT, ulnar neuropathy, carpal tunnel.  Patient has intact pulses, therefore doubt arterial occlusion.  Patient has had 2 new negative x-rays.  Do not suspect DVT at this time, as patient has had 2 weeks of symptoms, and no swelling, but given that patient's symptoms started with IV stick, will schedule patient for right upper extremity ultrasound.  Patient also given referral for neurologic assessment and EMG.   Return precautions were given for any increasing pain, swelling, change in color, or new or worsening symptoms.  Patient is in understanding and agrees with the plan of care.  Final Clinical Impressions(s) / ED Diagnoses   Final diagnoses:  Paresthesias  Right arm pain      Elisha Ponder, PA-C 04/27/18  1610    Cy Blamer, MD 04/27/18 [phone]

## 2018-05-09 ENCOUNTER — Telehealth: Payer: Self-pay

## 2018-05-09 NOTE — Telephone Encounter (Signed)
Called and spoke with patient. He states he will keep appointment for tomorrow at 05/10/2018. Thanks!

## 2018-05-10 ENCOUNTER — Encounter: Payer: Self-pay | Admitting: Family Medicine

## 2018-05-10 ENCOUNTER — Other Ambulatory Visit: Payer: Self-pay | Admitting: Family Medicine

## 2018-05-10 ENCOUNTER — Ambulatory Visit (INDEPENDENT_AMBULATORY_CARE_PROVIDER_SITE_OTHER): Payer: 59 | Admitting: Family Medicine

## 2018-05-10 VITALS — BP 122/68 | HR 68 | Temp 97.9°F | Ht 68.0 in | Wt 162.0 lb

## 2018-05-10 DIAGNOSIS — M7989 Other specified soft tissue disorders: Secondary | ICD-10-CM | POA: Diagnosis not present

## 2018-05-10 DIAGNOSIS — Z09 Encounter for follow-up examination after completed treatment for conditions other than malignant neoplasm: Secondary | ICD-10-CM | POA: Diagnosis not present

## 2018-05-10 DIAGNOSIS — D582 Other hemoglobinopathies: Secondary | ICD-10-CM

## 2018-05-10 DIAGNOSIS — D571 Sickle-cell disease without crisis: Secondary | ICD-10-CM | POA: Diagnosis not present

## 2018-05-10 DIAGNOSIS — G894 Chronic pain syndrome: Secondary | ICD-10-CM

## 2018-05-10 DIAGNOSIS — M79601 Pain in right arm: Secondary | ICD-10-CM | POA: Diagnosis not present

## 2018-05-10 MED ORDER — PREDNISONE 20 MG PO TABS: ORAL_TABLET | ORAL | 0 refills | Status: DC

## 2018-05-10 MED ORDER — IBUPROFEN 800 MG PO TABS: 800.0000 mg | ORAL_TABLET | Freq: Three times a day (TID) | ORAL | 1 refills | Status: DC | PRN

## 2018-05-10 MED ORDER — KETOROLAC TROMETHAMINE 60 MG/2ML IM SOLN
60.0000 mg | Freq: Once | INTRAMUSCULAR | Status: AC
  Administered 2018-05-10: 60 mg via INTRAMUSCULAR

## 2018-05-10 MED ORDER — MORPHINE SULFATE 15 MG PO TABS: 15.0000 mg | ORAL_TABLET | ORAL | 0 refills | Status: DC | PRN

## 2018-05-10 NOTE — Progress Notes (Signed)
Rx for Morphine and Motrin to pharmacy today.

## 2018-05-10 NOTE — Progress Notes (Signed)
Sickle Cell Sick Visit  Subjective:    Patient ID: Austin [name], male    DOB: 28-Apr-1991, 28 y.o.   MRN: [id number]   Chief Complaint  Patient presents with  . Follow-up    Right arm pain   HPI  [name] is a 27 year old male with Sickle Cell Anemia and Eczema. He is here today for sick visit follow up.  Current Status: Since his last office visit, he is doing well with no complaints. He states that he has "Non-Sickle Cell Pain" right arm, which radiate to his hands and fingers. Post his last hospital admission he acquired a infiltration to his right upper arm where his peripheral IV was placed. he rates this pain today at 10/10. His Sickle Cell pain is managed today. He has not has a hospital visit for Sickle Cell Crisis since 04/27/2018 where he was treated and discharged the same day. He is currently taking all medications as prescribed and staying well hydrated. He reports occasional dizziness and headaches.   He denies fevers, chills, fatigue, recent infections, weight loss, and night sweats. He has not had any headaches, visual changes, dizziness, and falls. No chest pain, heart palpitations, cough and shortness of breath reported. No reports of GI problems such as nausea, vomiting, diarrhea, and constipation. He has no reports of blood in stools, dysuria and hematuria. No depression or anxiety reported.  Past Medical History:  Diagnosis Date  . Eczema   . Sickle cell anemia (HCC)     Family History  Problem Relation Age of Onset  . Sickle cell trait Mother   . Diabetes Father   . Hypertension Father     Social History   Socioeconomic History  . Marital status: Married    Spouse name: Not on file  . Number of children: Not on file  . Years of education: Not on file  . Highest education level: Not on file  Occupational History  . Not on file  Social Needs  . Financial resource strain: Not on file  . Food insecurity:    Worry: Not on file    Inability: Not  on file  . Transportation needs:    Medical: Not on file    Non-medical: Not on file  Tobacco Use  . Smoking status: Former Smoker    Packs/day: 0.10  . Smokeless tobacco: Never Used  Substance and Sexual Activity  . Alcohol use: Yes    Comment: occ  . Drug use: Yes    Types: Marijuana  . Sexual activity: Not on file  Lifestyle  . Physical activity:    Days per week: Not on file    Minutes per session: Not on file  . Stress: Not on file  Relationships  . Social connections:    Talks on phone: Not on file    Gets together: Not on file    Attends religious service: Not on file    Active member of club or organization: Not on file    Attends meetings of clubs or organizations: Not on file    Relationship status: Not on file  . Intimate partner violence:    Fear of current or ex partner: Not on file    Emotionally abused: Not on file    Physically abused: Not on file    Forced sexual activity: Not on file  Other Topics Concern  . Not on file  Social History Narrative  . Not on file    Past Surgical History:  Procedure Laterality Date  . WISDOM TOOTH EXTRACTION      Immunization History  Administered Date(s) Administered  . Influenza,inj,Quad PF,6+ Mos 03/13/2018    Current Meds  Medication Sig  . diphenhydrAMINE (BENADRYL) 25 MG tablet Take 25 mg by mouth every 6 (six) hours as needed for allergies.  . folic acid (FOLVITE) 1 MG tablet Take 1 tablet (1 mg total) by mouth daily.  . ondansetron (ZOFRAN) 4 MG tablet Take 1 tablet (4 mg total) by mouth every 8 (eight) hours as needed for nausea or vomiting.  . [DISCONTINUED] ibuprofen (ADVIL,MOTRIN) 800 MG tablet Take 1 tablet (800 mg total) by mouth every 8 (eight) hours as needed.   Current Facility-Administered Medications for the 05/10/18 encounter (Office Visit) with Kallie Locks, FNP  Medication  . ketorolac (TORADOL) injection 60 mg    No Known Allergies  BP 122/68 (BP Location: Left Arm, Patient  Position: Sitting, Cuff Size: Small)   Pulse 68   Temp 97.9 F (36.6 C) (Oral)   Ht 5\' 8"  (1.727 m)   Wt 162 lb (73.5 kg)   SpO2 100%   BMI 24.63 kg/m    Review of Systems  Constitutional: Negative.   HENT: Negative.   Respiratory: Negative.   Cardiovascular: Negative.   Gastrointestinal: Negative.   Genitourinary: Negative.   Musculoskeletal: Positive for arthralgias (right arm pain today; r/t IV infiltration).  Skin: Negative.   Neurological: Negative.   Psychiatric/Behavioral: Negative.    Objective:   Physical Exam  Constitutional: He is oriented to person, place, and time. He appears well-developed and well-nourished.  HENT:  Head: Normocephalic and atraumatic.  Cardiovascular: Normal rate, regular rhythm, normal heart sounds and intact distal pulses.  Pulmonary/Chest: Effort normal and breath sounds normal.  Abdominal: Soft. Bowel sounds are normal.  Musculoskeletal: Normal range of motion.  Neurological: He is alert and oriented to person, place, and time.  Skin: Skin is warm and dry.  Nursing note and vitals reviewed.  Assessment & Plan:   1. Pain and swelling of upper extremity, right He will continue Motrin as needed for inflammation.  - predniSONE (DELTASONE) 20 MG tablet; Take 1 tablet daily X 10 days.  Dispense: 10 tablet; Refill: 0 - ketorolac (TORADOL) injection 60 mg  2. Right arm pain Stable. We will initiate Prednisone today. He will continue Motrin as needed.   3. Sickle cell anemia without crisis (HCC) She is doing well today. She will continue to take pain medications as prescribed; will continue to avoid extreme heat and cold; will continue to eat a healthy diet and drink at least 64 ounces of water daily; continue stool softener as needed; will avoid colds and flu; will continue to get plenty of sleep and rest; will continue to avoid high stressful situations and remain infection free; will continue Folic Acid 1 mg daily to avoid sickle cell  crisis.   4. Follow up He will keep follow up appointment 05/2018.  Meds ordered this encounter  Medications  . predniSONE (DELTASONE) 20 MG tablet    Sig: Take 1 tablet daily X 10 days.    Dispense:  10 tablet    Refill:  0  . ketorolac (TORADOL) injection 60 mg    [name],  MSN, FNP-C [facility] [address] [place], [place] [address] [phone]

## 2018-05-18 ENCOUNTER — Encounter (HOSPITAL_COMMUNITY): Payer: Self-pay | Admitting: *Deleted

## 2018-05-18 ENCOUNTER — Non-Acute Institutional Stay (HOSPITAL_COMMUNITY)
Admission: AD | Admit: 2018-05-18 | Discharge: 2018-05-18 | Disposition: A | Discharge status: Home / Self Care | Payer: 59 | Source: Ambulatory Visit | Attending: Internal Medicine | Admitting: Internal Medicine

## 2018-05-18 ENCOUNTER — Telehealth (HOSPITAL_COMMUNITY): Payer: Self-pay | Admitting: General Practice

## 2018-05-18 DIAGNOSIS — Z79899 Other long term (current) drug therapy: Secondary | ICD-10-CM | POA: Insufficient documentation

## 2018-05-18 DIAGNOSIS — M5489 Other dorsalgia: Secondary | ICD-10-CM | POA: Diagnosis not present

## 2018-05-18 DIAGNOSIS — Z87891 Personal history of nicotine dependence: Secondary | ICD-10-CM | POA: Diagnosis not present

## 2018-05-18 DIAGNOSIS — D5700 Hb-SS disease with crisis, unspecified: Secondary | ICD-10-CM | POA: Diagnosis not present

## 2018-05-18 LAB — CBC WITH DIFFERENTIAL/PLATELET
Abs Immature Granulocytes: 0.04 10*3/uL (ref 0.00–0.07)
BASOS ABS: 0 10*3/uL (ref 0.0–0.1)
BASOS PCT: 0 %
Eosinophils Absolute: 0.1 10*3/uL (ref 0.0–0.5)
Eosinophils Relative: 1 %
HCT: 38.6 % — ABNORMAL LOW (ref 39.0–52.0)
Hemoglobin: 13.5 g/dL (ref 13.0–17.0)
IMMATURE GRANULOCYTES: 0 %
Lymphocytes Relative: 29 %
Lymphs Abs: 3.6 10*3/uL (ref 0.7–4.0)
MCH: 27.2 pg (ref 26.0–34.0)
MCHC: 35 g/dL (ref 30.0–36.0)
MCV: 77.7 fL — AB (ref 80.0–100.0)
Monocytes Absolute: 1 10*3/uL (ref 0.1–1.0)
Monocytes Relative: 8 %
NEUTROS PCT: 62 %
NRBC: 0.5 % — AB (ref 0.0–0.2)
Neutro Abs: 7.6 10*3/uL (ref 1.7–7.7)
Platelets: 116 10*3/uL — ABNORMAL LOW (ref 150–400)
RBC: 4.97 MIL/uL (ref 4.22–5.81)
RDW: 14.5 % (ref 11.5–15.5)
WBC: 12.4 10*3/uL — AB (ref 4.0–10.5)

## 2018-05-18 LAB — COMPREHENSIVE METABOLIC PANEL
ALT: 15 U/L (ref 0–44)
ANION GAP: 8 (ref 5–15)
AST: 7 U/L — ABNORMAL LOW (ref 15–41)
Albumin: 4.8 g/dL (ref 3.5–5.0)
Alkaline Phosphatase: 49 U/L (ref 38–126)
BUN: 10 mg/dL (ref 6–20)
CALCIUM: 9.6 mg/dL (ref 8.9–10.3)
CHLORIDE: 105 mmol/L (ref 98–111)
CO2: 27 mmol/L (ref 22–32)
CREATININE: 0.75 mg/dL (ref 0.61–1.24)
Glucose, Bld: 85 mg/dL (ref 70–99)
Potassium: 3.5 mmol/L (ref 3.5–5.1)
Sodium: 140 mmol/L (ref 135–145)
Total Bilirubin: 1.6 mg/dL — ABNORMAL HIGH (ref 0.3–1.2)
Total Protein: 8.3 g/dL — ABNORMAL HIGH (ref 6.5–8.1)

## 2018-05-18 LAB — URINALYSIS, ROUTINE W REFLEX MICROSCOPIC
Bilirubin Urine: NEGATIVE
Glucose, UA: NEGATIVE mg/dL
Hgb urine dipstick: NEGATIVE
Ketones, ur: NEGATIVE mg/dL
LEUKOCYTES UA: NEGATIVE
NITRITE: NEGATIVE
PH: 6 (ref 5.0–8.0)
Protein, ur: NEGATIVE mg/dL
Specific Gravity, Urine: 1.017 (ref 1.005–1.030)

## 2018-05-18 MED ORDER — HYDROMORPHONE 1 MG/ML IV SOLN
INTRAVENOUS | Status: DC
  Administered 2018-05-18: 30 mg via INTRAVENOUS
  Administered 2018-05-18: 1.7 mg via INTRAVENOUS
  Filled 2018-05-18: qty 30

## 2018-05-18 MED ORDER — DIPHENHYDRAMINE HCL 50 MG/ML IJ SOLN: 12.5000 mg | Freq: Four times a day (QID) | INTRAMUSCULAR | Status: DC | PRN

## 2018-05-18 MED ORDER — SODIUM CHLORIDE 0.9% FLUSH: 9.0000 mL | INTRAVENOUS | Status: DC | PRN

## 2018-05-18 MED ORDER — ONDANSETRON HCL 4 MG/2ML IJ SOLN: 4.0000 mg | Freq: Four times a day (QID) | INTRAMUSCULAR | Status: DC | PRN

## 2018-05-18 MED ORDER — DEXTROSE-NACL 5-0.45 % IV SOLN
INTRAVENOUS | Status: DC
  Administered 2018-05-18: 10:00:00 via INTRAVENOUS

## 2018-05-18 MED ORDER — KETOROLAC TROMETHAMINE 30 MG/ML IJ SOLN
30.0000 mg | Freq: Four times a day (QID) | INTRAMUSCULAR | Status: DC
  Administered 2018-05-18: 30 mg via INTRAVENOUS
  Filled 2018-05-18: qty 1

## 2018-05-18 MED ORDER — NALOXONE HCL 0.4 MG/ML IJ SOLN: 0.4000 mg | INTRAMUSCULAR | Status: DC | PRN

## 2018-05-18 MED ORDER — DIPHENHYDRAMINE HCL 12.5 MG/5ML PO ELIX: 12.5000 mg | ORAL_SOLUTION | Freq: Four times a day (QID) | ORAL | Status: DC | PRN

## 2018-05-18 NOTE — H&P (Signed)
[facility] History and Physical   Date: 05/18/2018  Patient name: Austin [name] Medical record number: [id number] Date of birth: April 05, 1991 Age: 27 y.o. Gender: male PCP: Kallie Locks, FNP  Attending physician: Quentin Angst, MD  Chief Complaint: Low back pain  History of Present Illness: Finnbar Cedillos, a 27 year old male with a history of sickle cell anemia, hemoglobin [place] presents complaining of pain primarily to lower back that is consistent with typical sickle cell crisis.  Patient attributes current pain crisis to increase stressors.  Current pain intensity 9/10 characterized as constant and aching.  Patient last had MS IR 15 mg on last night without sustained relief.  Patient is mostly opiate nave, he does not take medications consistently for pain.  He states that he mostly uses over-the-counter Motrin for pain control.  He currently endorses right arm discomfort characterized as numbness and tingling.  Patient denies headache, blurred vision,chest pain, nausea, vomiting, or diarrhea. Patient admitted to day infusion center for pain management and extended observation. Meds: Medications Prior to Admission  Medication Sig Dispense Refill Last Dose  . diphenhydrAMINE (BENADRYL) 25 MG tablet Take 25 mg by mouth every 6 (six) hours as needed for allergies.   Taking  . folic acid (FOLVITE) 1 MG tablet Take 1 tablet (1 mg total) by mouth daily. 30 tablet 11 Taking  . ibuprofen (ADVIL,MOTRIN) 800 MG tablet Take 1 tablet (800 mg total) by mouth every 8 (eight) hours as needed. 30 tablet 1   . morphine (MSIR) 15 MG tablet Take 1 tablet (15 mg total) by mouth every 4 (four) hours as needed for up to 15 days for severe pain. 90 tablet 0   . ondansetron (ZOFRAN) 4 MG tablet Take 1 tablet (4 mg total) by mouth every 8 (eight) hours as needed for nausea or vomiting. 25 tablet 0 Taking  . predniSONE (DELTASONE) 20 MG tablet Take 1 tablet daily X 10 days. 10 tablet  0     Allergies: Patient has no known allergies. Past Medical History:  Diagnosis Date  . Eczema   . Sickle cell anemia (HCC)    Past Surgical History:  Procedure Laterality Date  . WISDOM TOOTH EXTRACTION     Family History  Problem Relation Age of Onset  . Sickle cell trait Mother   . Diabetes Father   . Hypertension Father    Social History   Socioeconomic History  . Marital status: Married    Spouse name: Not on file  . Number of children: Not on file  . Years of education: Not on file  . Highest education level: Not on file  Occupational History  . Not on file  Social Needs  . Financial resource strain: Not on file  . Food insecurity:    Worry: Not on file    Inability: Not on file  . Transportation needs:    Medical: Not on file    Non-medical: Not on file  Tobacco Use  . Smoking status: Former Smoker    Packs/day: 0.10  . Smokeless tobacco: Never Used  Substance and Sexual Activity  . Alcohol use: Yes    Comment: occ  . Drug use: Yes    Types: Marijuana  . Sexual activity: Not on file  Lifestyle  . Physical activity:    Days per week: Not on file    Minutes per session: Not on file  . Stress: Not on file  Relationships  . Social connections:  Talks on phone: Not on file    Gets together: Not on file    Attends religious service: Not on file    Active member of club or organization: Not on file    Attends meetings of clubs or organizations: Not on file    Relationship status: Not on file  . Intimate partner violence:    Fear of current or ex partner: Not on file    Emotionally abused: Not on file    Physically abused: Not on file    Forced sexual activity: Not on file  Other Topics Concern  . Not on file  Social History Narrative  . Not on file    Review of Systems: Review of Systems  Constitutional: Negative.   HENT: Negative.   Eyes: Negative.  Negative for pain and discharge.  Respiratory: Negative.   Cardiovascular: Negative.    Gastrointestinal: Negative.   Genitourinary: Negative for dysuria and urgency.  Musculoskeletal: Positive for back pain.  Skin: Negative.   Neurological: Negative.   Endo/Heme/Allergies: Negative.   Psychiatric/Behavioral: Negative.      Physical Exam: Blood pressure 123/79, pulse 68, temperature 97.9 F (36.6 C), temperature source Oral, resp. rate 16, SpO2 100 %. Physical Exam  Constitutional: He is oriented to person, place, and time. He appears well-developed and well-nourished.  HENT:  Head: Normocephalic and atraumatic.  Right Ear: External ear normal.  Eyes: Pupils are equal, round, and reactive to light.  Neck: Normal range of motion.  Cardiovascular: Normal rate, regular rhythm, normal heart sounds and intact distal pulses.  Pulmonary/Chest: Effort normal and breath sounds normal.  Abdominal: Soft. Bowel sounds are normal.  Musculoskeletal: He exhibits no edema, tenderness or deformity.  Neurological: He is alert and oriented to person, place, and time.  Skin: Skin is warm and dry.  Psychiatric: He has a normal mood and affect. His behavior is normal. Judgment and thought content normal.    Lab results: No results found for this or any previous visit (from the past 24 hour(s)).  Imaging results:  No results found.   Assessment & Plan:  Patient will be admitted to the day infusion center for extended observation  Start IV D5.45 for cellular rehydration at 125/hr  Start Toradol 30 mg IV x1   Full dose PCA High Concentration per weight based protocol.   Patient will be re-evaluated for pain intensity in the context of function and relationship to baseline as care progresses.  If no significant pain relief, will transfer patient to inpatient services for a higher level of care.   Will check CMP, reticulocytes and CBC w/differential   05/18/2018, 9:03 AM

## 2018-05-18 NOTE — Telephone Encounter (Signed)
Patient called, complained of pain in the back that he rated as 9/10. Denied chest pain, fever, diarrhea, abdominal pain, nausea/vomitting and priapism. Admitted to having means of transportation without driving self after treatment. Last took Morphine 15 mg at 4:45 pm yesterday. Provider notified; per provider, patient can come in for treatment. Patient notified verbalized understanding.

## 2018-05-18 NOTE — Progress Notes (Signed)
Patient admitted to the day infusion hospital for sickle cell pain. Patient initially reported pain in back rated 9/10. For pain management, patient placed on Dilaudid PCA, given 30 mg IV Toradol and hydrated with IV fluids. At discharge, patient rated pain level at 0/10. Discharge instructions given to patient. Vital signs stable. Patient alert, oriented and ambulatory at discharge.

## 2018-05-18 NOTE — Discharge Summary (Signed)
[facility] Discharge Summary   Patient ID: Austin [name] MRN: [id number] DOB/AGE: 09-09-1990 27 y.o.  Admit date: 05/18/2018 Discharge date: 05/18/2018  Primary Care Physician:  Kallie Locks, FNP  Admission Diagnoses:  Active Problems:   Sickle cell pain crisis [facility])   Discharge Medications:  Allergies as of 05/18/2018   No Known Allergies     Medication List    TAKE these medications   diphenhydrAMINE 25 MG tablet Commonly known as:  BENADRYL Take 25 mg by mouth every 6 (six) hours as needed for allergies.   folic acid 1 MG tablet Commonly known as:  FOLVITE Take 1 tablet (1 mg total) by mouth daily.   ibuprofen 800 MG tablet Commonly known as:  ADVIL,MOTRIN Take 1 tablet (800 mg total) by mouth every 8 (eight) hours as needed.   morphine 15 MG tablet Commonly known as:  MSIR Take 1 tablet (15 mg total) by mouth every 4 (four) hours as needed for up to 15 days for severe pain.   ondansetron 4 MG tablet Commonly known as:  ZOFRAN Take 1 tablet (4 mg total) by mouth every 8 (eight) hours as needed for nausea or vomiting.   predniSONE 20 MG tablet Commonly known as:  DELTASONE Take 1 tablet daily X 10 days.        Consults:  None  Significant Diagnostic Studies:  Dg Elbow Complete Right  Result Date: 04/27/2018 CLINICAL DATA:  27 y/o M; IV in the right elbow 3 weeks ago. Elbow pain radiating to the shoulder. EXAM: RIGHT ELBOW - COMPLETE 3+ VIEW COMPARISON:  04/19/2018 right upper extremity radiograph FINDINGS: There is no evidence of fracture, dislocation, or joint effusion. There is no evidence of arthropathy or other focal bone abnormality. IMPRESSION: No acute bony or articular abnormality. Electronically Signed   By: Mitzi Hansen M.D.   On: 04/27/2018 03:47   Dg Forearm Right  Result Date: 04/20/2018 CLINICAL DATA:  Right arm pain and swelling and tingling in the right fingers. The patient had an IV in the right  antecubital region 6 days ago. EXAM: RIGHT FOREARM - 2 VIEW COMPARISON:  None. FINDINGS: The bones are subjectively adequately mineralized. The observed portions of the elbow and wrist are normal. There are no abnormal soft tissue calcifications. No soft tissue gas collections are observed. I cannot exclude mild soft tissue swelling proximally. IMPRESSION: There is no acute or significant chronic bony abnormality of the right forearm. Possible mild generalized soft tissue swelling proximally. No focal soft tissue abnormalities are observed. Electronically Signed   By: David  [place] M.D.   On: 04/20/2018 07:57     [facility] Course: Austin [name], a 27 year old male with a history of sickle cell anemia was admitted to day infusion center for pain management and extended observation.   Hypotonic IV fluid initiated for cellular rehydration.  Patient opiate naive, started on full dose dilaudid PCA. Patient used a total of 1.7 mg with 4 demands and 4 delivered.  Patients pain resolved completely. He stated that he was not having pain prior to discharge.  Patient alert, oriented, and ambulating.  Discharged home with family in stable condition.   Discharge instructions:  Resume all home medications  Continue to hydrate with 64 ounces of fluid Strict return precautions discussed.  The patient was given clear instructions to go to ER or return to medical center if symptoms do not improve, worsen or new problems develop. The patient verbalized understanding.  Avoid stressors that precipitate sickle cell crisis.     Physical Exam at Discharge:  BP 124/84 (BP Location: Right Arm)   Pulse 66   Temp 97.9 F (36.6 C) (Oral)   Resp 10   SpO2 99%  Physical Exam  Constitutional: He is oriented to person, place, and time. He appears well-developed and well-nourished.  Eyes: Pupils are equal, round, and reactive to light.  Neck: Normal range of motion. Neck supple.  Cardiovascular:  Normal rate, regular rhythm and normal heart sounds.  Pulmonary/Chest: Effort normal and breath sounds normal.  Abdominal: Soft. Bowel sounds are normal.  Neurological: He is alert and oriented to person, place, and time.  Skin: Skin is warm and dry.  Psychiatric: He has a normal mood and affect. His behavior is normal. Judgment and thought content normal.    Disposition at Discharge: Discharge disposition: 01-Home or Self Care       Discharge Orders: Discharge Instructions    Discharge patient   Complete by:  As directed    Discharge disposition:  01-Home or Self Care   Discharge patient date:  05/18/2018      Condition at Discharge:   Stable  Time spent on Discharge:  20 minutes   Signed:  Nolon Nations  APRN, MSN, FNP-C Patient Care [facility] [address] [place], [place] [address] [phone]  05/18/2018, 12:46 PM

## 2018-05-18 NOTE — Discharge Instructions (Signed)
Recommend that you continue to hydrate with 64 ounces of water.  I will speak with [name] about starting a trial of Gabapentin to help with nerve pain (concerning right arm) Resume all home medications.   The patient was given clear instructions to go to ER or return to medical center if symptoms do not improve, worsen or new problems develop.    The number for [facility] is [phone]. Remember that your son is a blessing and he is going to do well. He is blessed to have an attentive dad. Thanks for sharing your story with me.     Sickle Cell Anemia, Adult Sickle cell anemia is a condition where your red blood cells are shaped like sickles. Red blood cells carry oxygen through the body. Sickle-shaped red blood cells do not live as long as normal red blood cells. They also clump together and block blood from flowing through the blood vessels. These things prevent the body from getting enough oxygen. Sickle cell anemia causes organ damage and pain. It also increases the risk of infection. Follow these instructions at home:  Drink enough fluid to keep your pee (urine) clear or pale yellow. Drink more in hot weather and during exercise.  Do not smoke. Smoking lowers oxygen levels in the blood.  Only take over-the-counter or prescription medicines as told by your doctor.  Take antibiotic medicines as told by your doctor. Make sure you finish them even if you start to feel better.  Take supplements as told by your doctor.  Consider wearing a medical alert bracelet. This tells anyone caring for you in an emergency of your condition.  When traveling, keep your medical information, doctors' names, and the medicines you take with you at all times.  If you have a fever, do not take fever medicines right away. This could cover up a problem. Tell your doctor.  Keep all follow-up visits with your doctor. Sickle cell anemia requires regular medical care. Contact a doctor  if: You have a fever. Get help right away if:  You feel dizzy or faint.  You have new belly (abdominal) pain, especially on the left side near the stomach area.  You have a lasting, often uncomfortable and painful erection of the penis (priapism). If it is not treated right away, you will become unable to have sex (impotence).  You have numbness in your arms or legs or you have a hard time moving them.  You have a hard time talking.  You have a fever or lasting symptoms for more than 2-3 days.  You have a fever and your symptoms suddenly get worse.  You have signs or symptoms of infection. These include: ? Chills. ? Being more tired than normal (lethargy). ? Irritability. ? Poor eating. ? Throwing up (vomiting).  You have pain that is not helped with medicine.  You have shortness of breath.  You have pain in your chest.  You are coughing up pus-like or bloody mucus.  You have a stiff neck.  Your feet or hands swell or have pain.  Your belly looks bloated.  Your joints hurt. This information is not intended to replace advice given to you by your health care provider. Make sure you discuss any questions you have with your health care provider. Document Released: 05/02/2013 Document Revised: 12/18/2015 Document Reviewed: 02/21/2013 Elsevier Interactive Patient Education  2017 [employer].

## 2018-06-01 ENCOUNTER — Ambulatory Visit (INDEPENDENT_AMBULATORY_CARE_PROVIDER_SITE_OTHER): Payer: 59 | Admitting: Diagnostic Neuroimaging

## 2018-06-01 ENCOUNTER — Encounter (INDEPENDENT_AMBULATORY_CARE_PROVIDER_SITE_OTHER): Payer: 59 | Admitting: Diagnostic Neuroimaging

## 2018-06-01 DIAGNOSIS — R202 Paresthesia of skin: Secondary | ICD-10-CM | POA: Diagnosis not present

## 2018-06-01 DIAGNOSIS — R200 Anesthesia of skin: Secondary | ICD-10-CM

## 2018-06-01 DIAGNOSIS — Z0289 Encounter for other administrative examinations: Secondary | ICD-10-CM

## 2018-06-02 NOTE — Procedures (Signed)
[facility]  NCS (NERVE CONDUCTION STUDY) WITH EMG (ELECTROMYOGRAPHY) REPORT   STUDY DATE: 06/01/18 PATIENT NAME: Austin [name] DOB: July 18, 1991 MRN: [id number]  ORDERING CLINICIAN: Jolene Schimke  TECHNOLOGIST: Charlesetta Ivory ELECTROMYOGRAPHER: Glenford Bayley. Sentoria Brent, MD  CLINICAL INFORMATION: 27 year old male here for evaluation of right hand numbness (digits 4 and 5).  Symptoms began following IV placement and therapy in the right antecubital fossa September 2019.  Patient noted pain and swelling in the right antecubital fossa near the prior IV placement.  Follow-up evaluation with ultrasound of right upper extremity demonstrated acute superficial vein thrombosis involving the right cephalic vein, but no evidence of deep vein thrombosis.  Symptoms have been persistent and intermittent since that time.   FINDINGS: NERVE CONDUCTION STUDY:  Right median and left ulnar motor responses are normal.    Right ulnar motor response has normal distal latency, amplitudes, slow conduction velocity with stimulation above the elbow (39 m/s), normal conduction velocity with stimulation below the elbow.  Bilateral ulnar F-wave latencies are normal.  Right median and bilateral ulnar sensory responses are normal.  Bilateral dorsal ulnar cutaneous nerve responses are normal.    NEEDLE ELECTROMYOGRAPHY:  Needle examination of right upper extremity deltoid, biceps, triceps, flexor carpi radialis, flexor carpi ulnaris, first dorsal interosseous muscles are normal.    IMPRESSION:   Slightly abnormal study demonstrating: - Right ulnar motor response has slow conduction velocity with stimulation above the elbow (39 m/s), normal conduction velocity with stimulation below the elbow and normal amplitudes.  Remainder of this study is normal.  Given the clinical context this could reflect mild focal demyelination from focal compression at the elbow.  No active or chronic denervation noted  on needle EMG.       INTERPRETING PHYSICIAN:  Suanne Marker, MD Certified in Neurology, Neurophysiology and Neuroimaging  [facility] [address] [place], [place] [address] [phone]   [facility]    Nerve / Sites Muscle Latency Ref. Amplitude Ref. Rel Amp Segments Distance Velocity Ref. Area    ms ms mV mV %  cm m/s m/s mVms  R Median - APB     Wrist APB 2.6 ?4.4 10.2 ?4.0 100 Wrist - APB 7   34.5     Upper arm APB 6.4  10.1  99.2 Upper arm - Wrist 24 62 ?49 32.5  R Ulnar - ADM     Wrist ADM 2.3 ?3.3 12.3 ?6.0 100 Wrist - ADM 7   34.5     B.Elbow ADM 5.4  11.6  94 B.Elbow - Wrist 20 64 ?49 33.3     A.Elbow ADM 8.0  10.7  92.7 A.Elbow - B.Elbow 10 39 ?49 33.1         A.Elbow - Wrist      L Ulnar - ADM     Wrist ADM 2.3 ?3.3 12.7 ?6.0 100 Wrist - ADM 7   38.6     B.Elbow ADM 5.8  10.6  83.9 B.Elbow - Wrist 20 56 ?49 34.3     A.Elbow ADM 7.6  10.5  98.9 A.Elbow - B.Elbow 10 56 ?49 34.4         A.Elbow - Wrist               SNC    Nerve / Sites Rec. Site Peak Lat Ref.  Amp Ref. Segments Distance    ms ms V V  cm  R Median - Orthodromic (Dig II, Mid palm)  Dig II Wrist 2.7 ?3.4 10 ?10 Dig II - Wrist 13  R Ulnar - Orthodromic, (Dig V, Mid palm)     Dig V Wrist 2.5 ?3.1 17 ?5 Dig V - Wrist 11  L Ulnar - Orthodromic, (Dig V, Mid palm)     Dig V Wrist 2.3 ?3.1 9 ?5 Dig V - Wrist 11  R Dorsal ulnar cutaneous - Hand dorsum (Forearm)     Forearm Hand dorsum 2.1 ?2.5 18 ?8 Forearm - Hand dorsum 8  L Dorsal ulnar cutaneous - Hand dorsum (Forearm)     Forearm Hand dorsum 2.1 ?2.5 22 ?8 Forearm - Hand dorsum 8               F  Wave    Nerve F Lat Ref.   ms ms  R Ulnar - ADM 26.1 ?32.0  L Ulnar - ADM 25.5 ?32.0         EMG full

## 2018-06-08 ENCOUNTER — Telehealth: Payer: Self-pay

## 2018-06-08 NOTE — Telephone Encounter (Signed)
Patient states that his nerve study was positive and he needs to be put on light duty and needs a letter provide stating this. He is on the schedule at 840 am for tomorrow.

## 2018-06-09 ENCOUNTER — Ambulatory Visit (INDEPENDENT_AMBULATORY_CARE_PROVIDER_SITE_OTHER): Payer: 59 | Admitting: Family Medicine

## 2018-06-09 ENCOUNTER — Encounter: Payer: Self-pay | Admitting: Family Medicine

## 2018-06-09 VITALS — BP 122/84 | HR 72 | Temp 98.4°F | Ht 68.0 in | Wt 159.0 lb

## 2018-06-09 DIAGNOSIS — M79601 Pain in right arm: Secondary | ICD-10-CM

## 2018-06-09 DIAGNOSIS — F1190 Opioid use, unspecified, uncomplicated: Secondary | ICD-10-CM

## 2018-06-09 DIAGNOSIS — D582 Other hemoglobinopathies: Secondary | ICD-10-CM

## 2018-06-09 DIAGNOSIS — Z09 Encounter for follow-up examination after completed treatment for conditions other than malignant neoplasm: Secondary | ICD-10-CM

## 2018-06-09 DIAGNOSIS — G894 Chronic pain syndrome: Secondary | ICD-10-CM

## 2018-06-09 LAB — POCT URINALYSIS DIP (MANUAL ENTRY)
Bilirubin, UA: NEGATIVE
Blood, UA: NEGATIVE
Glucose, UA: NEGATIVE mg/dL
Ketones, POC UA: NEGATIVE mg/dL
Leukocytes, UA: NEGATIVE
Nitrite, UA: NEGATIVE
Protein Ur, POC: NEGATIVE mg/dL
Spec Grav, UA: 1.025 (ref 1.010–1.025)
Urobilinogen, UA: 1 E.U./dL
pH, UA: 6 (ref 5.0–8.0)

## 2018-06-09 NOTE — Progress Notes (Signed)
Sickle Cell Anemia Follow Up  Subjective:    Patient ID: Austin [name], male    DOB: Apr 03, 1991, 27 y.o.   MRN: [id number]   Chief Complaint  Patient presents with  . Paperwork    HPI  Austin [name] is a 27 year old male with a past medical history of Eczema. He is here today for follow up.   Current Status: Since his last office visit, he is doing well with no complaints. He states that he has pain in his right arm and back. He rates his pain today at 8/10. Since his last hospital stay, he continues to have right upper arm pain. Scans of right arm on 04/20/2018 and 04/27/2018 are both negative. He has not has a hospital visit for Sickle Cell Crisis since 05/18/2018 where he was treated and discharged the same day. He is currently taking all medications as prescribed and staying well hydrated. He reports occasional dizziness and headaches.   He denies fevers, chills, fatigue, recent infections, weight loss, and night sweats. She has not had any headaches, visual changes, dizziness, and falls. No chest pain, heart palpitations, cough and shortness of breath reported. No reports of GI problems such as nausea, vomiting, diarrhea, and constipation. He has no reports of blood in stools, dysuria and hematuria. No depression or anxiety, and denies suicidal ideations, homicidal ideations, or auditory hallucinations. He denies pain today.   Past Medical History:  Diagnosis Date  . Eczema   . Sickle cell anemia (HCC)     Family History  Problem Relation Age of Onset  . Sickle cell trait Mother   . Diabetes Father   . Hypertension Father     Social History   Socioeconomic History  . Marital status: Married    Spouse name: Not on file  . Number of children: Not on file  . Years of education: Not on file  . Highest education level: Not on file  Occupational History  . Not on file  Social Needs  . Financial resource strain: Not on file  . Food insecurity:    Worry: Not on file   Inability: Not on file  . Transportation needs:    Medical: Not on file    Non-medical: Not on file  Tobacco Use  . Smoking status: Former Smoker    Packs/day: 0.10  . Smokeless tobacco: Never Used  Substance and Sexual Activity  . Alcohol use: Yes    Comment: occ  . Drug use: Yes    Types: Marijuana  . Sexual activity: Not on file  Lifestyle  . Physical activity:    Days per week: Not on file    Minutes per session: Not on file  . Stress: Not on file  Relationships  . Social connections:    Talks on phone: Not on file    Gets together: Not on file    Attends religious service: Not on file    Active member of club or organization: Not on file    Attends meetings of clubs or organizations: Not on file    Relationship status: Not on file  . Intimate partner violence:    Fear of current or ex partner: Not on file    Emotionally abused: Not on file    Physically abused: Not on file    Forced sexual activity: Not on file  Other Topics Concern  . Not on file  Social History Narrative  . Not on file    Past Surgical History:  Procedure Laterality Date  . WISDOM TOOTH EXTRACTION      Immunization History  Administered Date(s) Administered  . Influenza,inj,Quad PF,6+ Mos 03/13/2018    Current Meds  Medication Sig  . diphenhydrAMINE (BENADRYL) 25 MG tablet Take 25 mg by mouth every 6 (six) hours as needed for allergies.  . folic acid (FOLVITE) 1 MG tablet Take 1 tablet (1 mg total) by mouth daily.  Marland Kitchen. ibuprofen (ADVIL,MOTRIN) 800 MG tablet Take 1 tablet (800 mg total) by mouth every 8 (eight) hours as needed.  . ondansetron (ZOFRAN) 4 MG tablet Take 1 tablet (4 mg total) by mouth every 8 (eight) hours as needed for nausea or vomiting.    No Known Allergies  BP 122/84 (BP Location: Left Arm, Patient Position: Sitting, Cuff Size: Small)   Pulse 72   Temp 98.4 F (36.9 C) (Oral)   Ht 5\' 8"  (1.727 m)   Wt 159 lb (72.1 kg)   SpO2 100%   BMI 24.18 kg/m   Review of  Systems  Constitutional: Negative.   HENT: Negative.   Eyes: Negative.   Respiratory: Negative.   Cardiovascular: Negative.   Gastrointestinal: Positive for nausea.  Endocrine: Negative.   Genitourinary: Negative.   Musculoskeletal: Positive for arthralgias (right arm pain) and back pain.  Skin: Negative.   Allergic/Immunologic: Negative.   Neurological: Positive for dizziness and headaches.  Hematological: Negative.   Psychiatric/Behavioral: Negative.    Objective:   Physical Exam  Constitutional: He is oriented to person, place, and time. He appears well-developed and well-nourished.  HENT:  Head: Normocephalic and atraumatic.  Right Ear: External ear normal.  Eyes: Pupils are equal, round, and reactive to light. Conjunctivae and EOM are normal.  Neck: Normal range of motion. Neck supple.  Cardiovascular: Normal rate, regular rhythm, normal heart sounds and intact distal pulses.  Pulmonary/Chest: Effort normal and breath sounds normal.  Abdominal: Soft. Bowel sounds are normal.  Musculoskeletal: Normal range of motion.  Neurological: He is alert and oriented to person, place, and time.  Skin: Skin is warm.  Psychiatric: He has a normal mood and affect. His behavior is normal. Judgment and thought content normal.  Nursing note and vitals reviewed.  Assessment & Plan:   1. Hemoglobin C-C disease (HCC) He continues to have increased pain in his right upper arm. Encouraged him to take Motrin every 8 hours as prescribed to decrease inflammation. He will continue to take pain medications as prescribed; will continue to avoid extreme heat and cold; will continue to eat a healthy diet and drink at least 64 ounces of water daily; continue stool softener as needed; will avoid colds and flu; will continue to get plenty of sleep and rest; will continue to avoid high stressful situations and remain infection free; will continue Folic Acid 1 mg daily to avoid sickle cell crisis.   Doctor's  excuse written today for light-duty at work.   2. Chronic pain syndrome  3. Chronic, continuous use of opioids  4. Right arm pain Increased today.   5. Follow up He will follow up in 2 weeks - POCT urinalysis dipstick . No orders of the defined types were placed in this encounter.   Raliegh IpNatalie Ryen Rhames,  MSN, FNP-C Patient Care [facility] [address]  [place], [place] [id number] [phone]

## 2018-06-19 ENCOUNTER — Telehealth: Payer: Self-pay

## 2018-06-19 ENCOUNTER — Other Ambulatory Visit: Payer: Self-pay | Admitting: Family Medicine

## 2018-06-19 ENCOUNTER — Ambulatory Visit: Payer: 59 | Admitting: Family Medicine

## 2018-06-19 NOTE — Telephone Encounter (Signed)
Patient states that his job needs to know exactly what light duty consist of because he does a lot of pushing and pulling at work. Patient was unable to work Friday because they wanted this information first. Patient states that his arm pain has gotten worse over last couple of days. Letter can be faxed to [phone]. Please advise

## 2018-06-26 ENCOUNTER — Ambulatory Visit: Payer: 59 | Admitting: Family Medicine

## 2018-06-26 ENCOUNTER — Telehealth: Payer: Self-pay

## 2018-06-26 DIAGNOSIS — G894 Chronic pain syndrome: Secondary | ICD-10-CM

## 2018-06-26 DIAGNOSIS — D582 Other hemoglobinopathies: Secondary | ICD-10-CM

## 2018-06-27 MED ORDER — MORPHINE SULFATE 15 MG PO TABS: 15.0000 mg | ORAL_TABLET | ORAL | 0 refills | Status: DC | PRN

## 2018-06-27 NOTE — Telephone Encounter (Signed)
Patient needs a refill on Morphine 15mg  sent to [facility].

## 2018-07-03 ENCOUNTER — Encounter: Payer: Self-pay | Admitting: Family Medicine

## 2018-07-03 ENCOUNTER — Ambulatory Visit (INDEPENDENT_AMBULATORY_CARE_PROVIDER_SITE_OTHER): Payer: 59 | Admitting: Family Medicine

## 2018-07-03 VITALS — BP 128/78 | HR 92 | Temp 97.9°F | Ht 68.0 in | Wt 164.0 lb

## 2018-07-03 DIAGNOSIS — M79601 Pain in right arm: Secondary | ICD-10-CM

## 2018-07-03 DIAGNOSIS — D582 Other hemoglobinopathies: Secondary | ICD-10-CM | POA: Diagnosis not present

## 2018-07-03 DIAGNOSIS — Z09 Encounter for follow-up examination after completed treatment for conditions other than malignant neoplasm: Secondary | ICD-10-CM

## 2018-07-03 DIAGNOSIS — R110 Nausea: Secondary | ICD-10-CM | POA: Diagnosis not present

## 2018-07-03 DIAGNOSIS — D571 Sickle-cell disease without crisis: Secondary | ICD-10-CM | POA: Diagnosis not present

## 2018-07-03 DIAGNOSIS — F1190 Opioid use, unspecified, uncomplicated: Secondary | ICD-10-CM

## 2018-07-03 DIAGNOSIS — G894 Chronic pain syndrome: Secondary | ICD-10-CM | POA: Diagnosis not present

## 2018-07-03 MED ORDER — PREDNISONE 10 MG PO TABS: 10.0000 mg | ORAL_TABLET | Freq: Every day | ORAL | 0 refills | Status: DC

## 2018-07-03 NOTE — Progress Notes (Signed)
Follow Up  Subjective:    Patient ID: Austin [name], male    DOB: 1990-10-26, 27 y.o.   MRN: [id number]  Chief Complaint  Patient presents with  . Follow-up    FMLA paperwork    HPI  Austin [name] is a 27 year old male with a past medical history of Sickle Cell Anemia and Eczema. He is here today for follow up.   Current Status: Since his last office visit, he is doing well with no complaints. He states that he usually has r/t sickle cell pain in in his back. He rates non-sickle cell right arm pain today at 4/10. He has not has a hospital visit for Sickle Cell Crisis since 05/18/2018 where he was treated and discharged the same day. He is currently taking all medications as prescribed and staying well hydrated. He reports occasional dizziness and headaches.   He denies fevers, chills, fatigue, recent infections, weight loss, and night sweats. He has not had any visual changes, and falls. No chest pain, heart palpitations, cough and shortness of breath reported. No reports of GI problems such as nausea, vomiting, diarrhea, and constipation. He has no reports of blood in stools, dysuria and hematuria. No depression or anxiety reported. He denies pain today.   Review of Systems  Constitutional: Negative.   HENT: Negative.   Eyes: Negative.   Respiratory: Negative.   Cardiovascular: Negative.   Gastrointestinal: Positive for nausea.  Endocrine: Negative.   Genitourinary: Negative.   Musculoskeletal: Positive for arthralgias (right arm pain) and back pain.  Skin: Negative.   Allergic/Immunologic: Negative.   Neurological: Negative.   Hematological: Negative.   Psychiatric/Behavioral: Negative.    Objective:   Physical Exam  Constitutional: He is oriented to person, place, and time. He appears well-developed and well-nourished.  HENT:  Head: Normocephalic and atraumatic.  Eyes: Pupils are equal, round, and reactive to light. Conjunctivae and EOM are normal.  Neck: Normal range  of motion. Neck supple.  Cardiovascular: Normal rate, regular rhythm, normal heart sounds and intact distal pulses.  Pulmonary/Chest: Effort normal and breath sounds normal.  Abdominal: Soft. Bowel sounds are normal.  Musculoskeletal: Normal range of motion.  Neurological: He is alert and oriented to person, place, and time.  Skin: Skin is warm and dry.  Psychiatric: He has a normal mood and affect. His behavior is normal. Judgment and thought content normal.  Nursing note and vitals reviewed.  Assessment & Plan:   1. Hemoglobin C-C disease (HCC)  2. Sickle cell anemia without crisis [facility](HCC) He is doing well today. He will continue to take pain medications as prescribed; will continue to avoid extreme heat and cold; will continue to eat a healthy diet and drink at least 64 ounces of water daily; continue stool softener as needed; will avoid colds and flu; will continue to get plenty of sleep and rest; will continue to avoid high stressful situations and remain infection free; will continue Folic Acid 1 mg daily to avoid sickle cell crisis.  Letter for FMLA for light duty on job written today.   3. Chronic pain syndrome  4. Chronic, continuous use of opioids  5. Right arm pain Mild swelling noted at previous IV site. We will initiate Prednisone today.   6. Nausea Stable today.   7. Follow up He will follow up in 2 months.   No orders of the defined types were placed in this encounter.  Raliegh IpNatalie Varsha Knock,  MSN, FNP-C [facility] [phone]  [place], [place] [address] [phone]

## 2018-07-03 NOTE — Patient Instructions (Signed)
Prednisone tablets °What is this medicine? °PREDNISONE (PRED ni sone) is a corticosteroid. It is commonly used to treat inflammation of the skin, joints, lungs, and other organs. Common conditions treated include asthma, allergies, and arthritis. It is also used for other conditions, such as blood disorders and diseases of the adrenal glands. °This medicine may be used for other purposes; ask your health care provider or pharmacist if you have questions. °COMMON BRAND NAME(S): Deltasone, Predone, Sterapred, Sterapred DS °What should I tell my health care provider before I take this medicine? °They need to know if you have any of these conditions: °-Cushing's syndrome °-diabetes °-glaucoma °-heart disease °-high blood pressure °-infection (especially a virus infection such as chickenpox, cold sores, or herpes) °-kidney disease °-liver disease °-mental illness °-myasthenia gravis °-osteoporosis °-seizures °-stomach or intestine problems °-thyroid disease °-an unusual or allergic reaction to lactose, prednisone, other medicines, foods, dyes, or preservatives °-pregnant or trying to get pregnant °-breast-feeding °How should I use this medicine? °Take this medicine by mouth with a glass of water. Follow the directions on the prescription label. Take this medicine with food. If you are taking this medicine once a day, take it in the morning. Do not take more medicine than you are told to take. Do not suddenly stop taking your medicine because you may develop a severe reaction. Your doctor will tell you how much medicine to take. If your doctor wants you to stop the medicine, the dose may be slowly lowered over time to avoid any side effects. °Talk to your pediatrician regarding the use of this medicine in children. Special care may be needed. °Overdosage: If you think you have taken too much of this medicine contact a poison control center or emergency room at once. °NOTE: This medicine is only for you. Do not share this  medicine with others. °What if I miss a dose? °If you miss a dose, take it as soon as you can. If it is almost time for your next dose, talk to your doctor or health care professional. You may need to miss a dose or take an extra dose. Do not take double or extra doses without advice. °What may interact with this medicine? °Do not take this medicine with any of the following medications: °-metyrapone °-mifepristone °This medicine may also interact with the following medications: °-aminoglutethimide °-amphotericin B °-aspirin and aspirin-like medicines °-barbiturates °-certain medicines for diabetes, like glipizide or glyburide °-cholestyramine °-cholinesterase inhibitors °-cyclosporine °-digoxin °-diuretics °-ephedrine °-male hormones, like estrogens and birth control pills °-isoniazid °-ketoconazole °-NSAIDS, medicines for pain and inflammation, like ibuprofen or naproxen °-phenytoin °-rifampin °-toxoids °-vaccines °-warfarin °This list may not describe all possible interactions. Give your health care provider a list of all the medicines, herbs, non-prescription drugs, or dietary supplements you use. Also tell them if you smoke, drink alcohol, or use illegal drugs. Some items may interact with your medicine. °What should I watch for while using this medicine? °Visit your doctor or health care professional for regular checks on your progress. If you are taking this medicine over a prolonged period, carry an identification card with your name and address, the type and dose of your medicine, and your doctor's name and address. °This medicine may increase your risk of getting an infection. Tell your doctor or health care professional if you are around anyone with measles or chickenpox, or if you develop sores or blisters that do not heal properly. °If you are going to have surgery, tell your doctor or health care professional that   you have taken this medicine within the last twelve months. °Ask your doctor or health  care professional about your diet. You may need to lower the amount of salt you eat. °This medicine may affect blood sugar levels. If you have diabetes, check with your doctor or health care professional before you change your diet or the dose of your diabetic medicine. °What side effects may I notice from receiving this medicine? °Side effects that you should report to your doctor or health care professional as soon as possible: °-allergic reactions like skin rash, itching or hives, swelling of the face, lips, or tongue °-changes in emotions or moods °-changes in vision °-depressed mood °-eye pain °-fever or chills, cough, sore throat, pain or difficulty passing urine °-increased thirst °-swelling of ankles, feet °Side effects that usually do not require medical attention (report to your doctor or health care professional if they continue or are bothersome): °-confusion, excitement, restlessness °-headache °-nausea, vomiting °-skin problems, acne, thin and shiny skin °-trouble sleeping °-weight gain °This list may not describe all possible side effects. Call your doctor for medical advice about side effects. You may report side effects to FDA at 1-800-FDA-1088. °Where should I keep my medicine? °Keep out of the reach of children. °Store at room temperature between 15 and 30 degrees C (59 and 86 degrees F). Protect from light. Keep container tightly closed. Throw away any unused medicine after the expiration date. °NOTE: This sheet is a summary. It may not cover all possible information. If you have questions about this medicine, talk to your doctor, pharmacist, or health care provider. °© 2018 Elsevier/Gold Standard (2011-02-25 10:57:14) ° °

## 2018-07-25 ENCOUNTER — Telehealth (HOSPITAL_COMMUNITY): Payer: Self-pay | Admitting: General Practice

## 2018-07-25 ENCOUNTER — Non-Acute Institutional Stay (HOSPITAL_COMMUNITY)
Admission: AD | Admit: 2018-07-25 | Discharge: 2018-07-25 | Disposition: A | Discharge status: Home / Self Care | Payer: 59 | Source: Ambulatory Visit | Attending: Internal Medicine | Admitting: Internal Medicine

## 2018-07-25 DIAGNOSIS — Z87891 Personal history of nicotine dependence: Secondary | ICD-10-CM | POA: Insufficient documentation

## 2018-07-25 DIAGNOSIS — Z791 Long term (current) use of non-steroidal anti-inflammatories (NSAID): Secondary | ICD-10-CM | POA: Diagnosis not present

## 2018-07-25 DIAGNOSIS — Z79899 Other long term (current) drug therapy: Secondary | ICD-10-CM | POA: Diagnosis not present

## 2018-07-25 DIAGNOSIS — D5700 Hb-SS disease with crisis, unspecified: Secondary | ICD-10-CM | POA: Diagnosis not present

## 2018-07-25 LAB — COMPREHENSIVE METABOLIC PANEL
ALBUMIN: 4.8 g/dL (ref 3.5–5.0)
ALT: 22 U/L (ref 0–44)
ANION GAP: 9 (ref 5–15)
AST: 14 U/L — ABNORMAL LOW (ref 15–41)
Alkaline Phosphatase: 44 U/L (ref 38–126)
BUN: 9 mg/dL (ref 6–20)
CHLORIDE: 101 mmol/L (ref 98–111)
CO2: 27 mmol/L (ref 22–32)
CREATININE: 0.87 mg/dL (ref 0.61–1.24)
Calcium: 9.6 mg/dL (ref 8.9–10.3)
Glucose, Bld: 116 mg/dL — ABNORMAL HIGH (ref 70–99)
POTASSIUM: 3.7 mmol/L (ref 3.5–5.1)
SODIUM: 137 mmol/L (ref 135–145)
Total Bilirubin: 1.7 mg/dL — ABNORMAL HIGH (ref 0.3–1.2)
Total Protein: 7.8 g/dL (ref 6.5–8.1)

## 2018-07-25 LAB — CBC WITH DIFFERENTIAL/PLATELET
Abs Immature Granulocytes: 0.04 10*3/uL (ref 0.00–0.07)
BASOS ABS: 0 10*3/uL (ref 0.0–0.1)
Basophils Relative: 0 %
EOS PCT: 1 %
Eosinophils Absolute: 0.1 10*3/uL (ref 0.0–0.5)
HEMATOCRIT: 37.3 % — AB (ref 39.0–52.0)
HEMOGLOBIN: 13 g/dL (ref 13.0–17.0)
IMMATURE GRANULOCYTES: 0 %
LYMPHS ABS: 3.1 10*3/uL (ref 0.7–4.0)
Lymphocytes Relative: 32 %
MCH: 28 pg (ref 26.0–34.0)
MCHC: 34.9 g/dL (ref 30.0–36.0)
MCV: 80.2 fL (ref 80.0–100.0)
Monocytes Absolute: 0.9 10*3/uL (ref 0.1–1.0)
Monocytes Relative: 9 %
NEUTROS ABS: 5.7 10*3/uL (ref 1.7–7.7)
NEUTROS PCT: 58 %
NRBC: 0.5 % — AB (ref 0.0–0.2)
Platelets: 138 10*3/uL — ABNORMAL LOW (ref 150–400)
RBC: 4.65 MIL/uL (ref 4.22–5.81)
RDW: 14.3 % (ref 11.5–15.5)
WBC: 9.9 10*3/uL (ref 4.0–10.5)

## 2018-07-25 LAB — URINALYSIS, ROUTINE W REFLEX MICROSCOPIC
Bilirubin Urine: NEGATIVE
Glucose, UA: NEGATIVE mg/dL
Hgb urine dipstick: NEGATIVE
Ketones, ur: NEGATIVE mg/dL
LEUKOCYTES UA: NEGATIVE
NITRITE: NEGATIVE
PH: 7 (ref 5.0–8.0)
Protein, ur: NEGATIVE mg/dL
SPECIFIC GRAVITY, URINE: 1.003 — AB (ref 1.005–1.030)

## 2018-07-25 MED ORDER — SODIUM CHLORIDE 0.9% FLUSH: 9.0000 mL | INTRAVENOUS | Status: DC | PRN

## 2018-07-25 MED ORDER — ONDANSETRON HCL 4 MG/2ML IJ SOLN
4.0000 mg | Freq: Four times a day (QID) | INTRAMUSCULAR | Status: DC | PRN
  Administered 2018-07-25: 4 mg via INTRAVENOUS
  Filled 2018-07-25: qty 2

## 2018-07-25 MED ORDER — HYDROMORPHONE 1 MG/ML IV SOLN
INTRAVENOUS | Status: DC
  Administered 2018-07-25: 30 mg via INTRAVENOUS
  Administered 2018-07-25: 2.5 mg via INTRAVENOUS
  Filled 2018-07-25: qty 30

## 2018-07-25 MED ORDER — ACETAMINOPHEN 325 MG PO TABS
650.0000 mg | ORAL_TABLET | Freq: Once | ORAL | Status: AC
  Administered 2018-07-25: 650 mg via ORAL
  Filled 2018-07-25: qty 2

## 2018-07-25 MED ORDER — DIPHENHYDRAMINE HCL 25 MG PO CAPS: 25.0000 mg | ORAL_CAPSULE | ORAL | Status: DC | PRN

## 2018-07-25 MED ORDER — KETOROLAC TROMETHAMINE 15 MG/ML IJ SOLN
15.0000 mg | Freq: Once | INTRAMUSCULAR | Status: AC
  Administered 2018-07-25: 15 mg via INTRAVENOUS
  Filled 2018-07-25: qty 1

## 2018-07-25 MED ORDER — DEXTROSE-NACL 5-0.45 % IV SOLN
INTRAVENOUS | Status: DC
  Administered 2018-07-25: 12:00:00 via INTRAVENOUS

## 2018-07-25 MED ORDER — NALOXONE HCL 0.4 MG/ML IJ SOLN: 0.4000 mg | INTRAMUSCULAR | Status: DC | PRN

## 2018-07-25 NOTE — Discharge Summary (Signed)
[facility] Discharge Summary   Patient ID: Austin [name] MRN: [id number] DOB/AGE: 28-30-1992 28 y.o.  Admit date: 07/25/2018 Discharge date: 07/25/2018  Primary Care Physician:  Kallie LocksStroud, Natalie M, FNP  Admission Diagnoses:  Active Problems:   Sickle cell pain crisis [facility](HCC)  Discharge Medications:  Allergies as of 07/25/2018   No Known Allergies     Medication List    TAKE these medications   diphenhydrAMINE 25 MG tablet Commonly known as:  BENADRYL Take 25 mg by mouth every 6 (six) hours as needed for allergies.   folic acid 1 MG tablet Commonly known as:  FOLVITE Take 1 tablet (1 mg total) by mouth daily.   ibuprofen 800 MG tablet Commonly known as:  ADVIL,MOTRIN Take 1 tablet (800 mg total) by mouth every 8 (eight) hours as needed.   ondansetron 4 MG tablet Commonly known as:  ZOFRAN Take 1 tablet (4 mg total) by mouth every 8 (eight) hours as needed for nausea or vomiting.   predniSONE 10 MG tablet Commonly known as:  DELTASONE Take 1 tablet (10 mg total) by mouth daily with breakfast.        Consults:  None  Significant Diagnostic Studies:  No results found.   [facility] Course: Austin [name], a 27 year old male with a medical history significant for sickle cell anemia was admitted today infusion center for pain management and extended observation.  All laboratory values reviewed, consistent with patient's baseline.  D5 0.45% saline at 125 mL/h Toradol 15 mg x 1 Tylenol 650 mg x 1 Initiated PCA Dilaudid per weight-based protocol, patient opiate tolerant.  Patient used a total of 2.5 mg with 4 demands and 4 delivered.  Pain resolved, patient not having pain prior to discharge. Patient alert, ambulating, and oriented.  Oxygen saturation above 90% on room air  Discharge instructions: Patient is to resume all home medications.  Discussed the importance of taking MS IR 15 mg every 4 hours as needed for moderate  to severe pain associated with sickle cell crisis. Patient to avoid all stressors that precipitate sickle cell crisis Patient is to continue to hydrate with 64 ounces of water per day  The patient was given clear instructions to go to ER or return to medical center if symptoms do not improve, worsen or new problems develop.   Physical Exam at Discharge:  BP 112/74 (BP Location: Right Arm)   Pulse (!) 58   Temp 97.6 F (36.4 C) (Oral)   Resp 12   SpO2 96%  Physical Exam Constitutional:      Appearance: He is normal weight.  HENT:     Head: Normocephalic.     Mouth/Throat:     Mouth: Mucous membranes are dry.  Eyes:     Pupils: Pupils are equal, round, and reactive to light.  Neck:     Musculoskeletal: Normal range of motion.  Cardiovascular:     Rate and Rhythm: Regular rhythm. Tachycardia present.  Pulmonary:     Effort: Pulmonary effort is normal.  Abdominal:     General: Abdomen is flat.     Palpations: Abdomen is soft.  Musculoskeletal: Normal range of motion.  Skin:    General: Skin is warm and dry.  Neurological:     General: No focal deficit present.     Mental Status: He is alert. Mental status is at baseline.  Psychiatric:        Mood and Affect: Mood normal.  Behavior: Behavior normal.        Thought Content: Thought content normal.        Judgment: Judgment normal.     Disposition at Discharge: Discharge disposition: 01-Home or Self Care       Discharge Orders: Discharge Instructions    Discharge patient   Complete by:  As directed    Discharge disposition:  01-Home or Self Care   Discharge patient date:  07/25/2018      Condition at Discharge:   Stable  Time spent on Discharge:  Greater than 30 minutes.  Signed: Nolon NationsLachina Moore Lebaron Bautch  APRN, MSN, FNP-C Patient Care [facility] [address]  , [place] [address] [phone]  07/25/2018, 3:02 PM

## 2018-07-25 NOTE — Discharge Instructions (Signed)
Continue to hydrate with 64 ounces of water.  The patient was given clear instructions to go to ER or return to medical center if symptoms do not improve, worsen or new problems develop.   Sickle Cell Anemia, Adult Sickle cell anemia is a condition where your red blood cells are shaped like sickles. Red blood cells carry oxygen through the body. Sickle-shaped cells do not live as long as normal red blood cells. They also clump together and block blood from flowing through the blood vessels. This prevents the body from getting enough oxygen. Sickle cell anemia causes organ damage and pain. It also increases the risk of infection. Follow these instructions at home: Medicines  Take over-the-counter and prescription medicines only as told by your doctor.  If you were prescribed an antibiotic medicine, take it as told by your doctor. Do not stop taking the antibiotic even if you start to feel better.  If you develop a fever, do not take medicines to lower the fever right away. Tell your doctor about the fever. Managing pain, stiffness, and swelling  Try these methods to help with pain: ? Use a heating pad. ? Take a warm bath. ? Distract yourself, such as by watching TV. Eating and drinking  Drink enough fluid to keep your pee (urine) clear or pale yellow. Drink more in hot weather and during exercise.  Limit or avoid alcohol.  Eat a healthy diet. Eat plenty of fruits, vegetables, whole grains, and lean protein.  Take vitamins and supplements as told by your doctor. Traveling  When traveling, keep these with you: ? Your medical information. ? The names of your doctors. ? Your medicines.  If you need to take an airplane, talk to your doctor first. Activity  Rest often.  Avoid exercises that make your heart beat much faster, such as jogging. General instructions  Do not use products that have nicotine or tobacco, such as cigarettes and e-cigarettes. If you need help quitting, ask  your doctor.  Consider wearing a medical alert bracelet.  Avoid being in high places (high altitudes), such as mountains.  Avoid very hot or cold temperatures.  Avoid places where the temperature changes a lot.  Keep all follow-up visits as told by your doctor. This is important. Contact a doctor if:  A joint hurts.  Your feet or hands hurt or swell.  You feel tired (fatigued). Get help right away if:  You have symptoms of infection. These include: ? Fever. ? Chills. ? Being very tired. ? Irritability. ? Poor eating. ? Throwing up (vomiting).  You feel dizzy or faint.  You have new stomach pain, especially on the left side.  You have a an erection (priapism) that lasts more than 4 hours.  You have numbness in your arms or legs.  You have a hard time moving your arms or legs.  You have trouble talking.  You have pain that does not go away when you take medicine.  You are short of breath.  You are breathing fast.  You have a long-term cough.  You have pain in your chest.  You have a bad headache.  You have a stiff neck.  Your stomach looks bloated even though you did not eat much.  Your skin is pale.  You suddenly cannot see well. Summary  Sickle cell anemia is a condition where your red blood cells are shaped like sickles.  Follow your doctor's advice on ways to manage pain, food to eat, activities to do, and  steps to take for safe travel.  Get medical help right away if you have any signs of infection, such as a fever. This information is not intended to replace advice given to you by your health care provider. Make sure you discuss any questions you have with your health care provider. Document Released: 05/02/2013 Document Revised: 08/17/2016 Document Reviewed: 08/17/2016 Elsevier Interactive Patient Education  2019 [employer].

## 2018-07-25 NOTE — H&P (Signed)
[facility] History and Physical   Date: 07/25/2018  Patient name: Austin [name] Medical record number: [id number] Date of birth: 11-15-1990 Age: 27 y.o. Gender: male PCP: Kallie LocksStroud, Natalie M, FNP  Attending physician: Quentin AngstJegede, Olugbemiga E, MD  Chief Complaint: Generalized pain  History of Present Illness: A 27 year old male with a medical history significant for sickle cell anemia presents complaining of pain primarily to neck, flanks bilaterally, and lower back that is consistent with typical sickle cell crisis.  Patient states that onset of pain was several days ago.  Patient has not identified any palliative or provocative factors.  He last had MS IR 15 mg around 945 this a.m. without sustained relief.  He states that he has been taking medication consistently over the past several days.  Current pain intensity is 10/10, constant, and aching.  Patient denies headache, chest pain, shortness of breath, nausea, vomiting, dysuria, dizziness, or diarrhea. Meds: Medications Prior to Admission  Medication Sig Dispense Refill Last Dose  . diphenhydrAMINE (BENADRYL) 25 MG tablet Take 25 mg by mouth every 6 (six) hours as needed for allergies.   Taking  . folic acid (FOLVITE) 1 MG tablet Take 1 tablet (1 mg total) by mouth daily. 30 tablet 11 Taking  . ibuprofen (ADVIL,MOTRIN) 800 MG tablet Take 1 tablet (800 mg total) by mouth every 8 (eight) hours as needed. 30 tablet 1 Taking  . ondansetron (ZOFRAN) 4 MG tablet Take 1 tablet (4 mg total) by mouth every 8 (eight) hours as needed for nausea or vomiting. 25 tablet 0 Taking  . predniSONE (DELTASONE) 10 MG tablet Take 1 tablet (10 mg total) by mouth daily with breakfast. 10 tablet 0     Allergies: Patient has no known allergies. Past Medical History:  Diagnosis Date  . Eczema   . Sickle cell anemia (HCC)    Past Surgical History:  Procedure Laterality Date  . WISDOM TOOTH EXTRACTION     Family History  Problem Relation  Age of Onset  . Sickle cell trait Mother   . Diabetes Father   . Hypertension Father    Social History   Socioeconomic History  . Marital status: Married    Spouse name: Not on file  . Number of children: Not on file  . Years of education: Not on file  . Highest education level: Not on file  Occupational History  . Not on file  Social Needs  . Financial resource strain: Not on file  . Food insecurity:    Worry: Not on file    Inability: Not on file  . Transportation needs:    Medical: Not on file    Non-medical: Not on file  Tobacco Use  . Smoking status: Former Smoker    Packs/day: 0.10  . Smokeless tobacco: Never Used  Substance and Sexual Activity  . Alcohol use: Yes    Comment: occ  . Drug use: Yes    Types: Marijuana  . Sexual activity: Not on file  Lifestyle  . Physical activity:    Days per week: Not on file    Minutes per session: Not on file  . Stress: Not on file  Relationships  . Social connections:    Talks on phone: Not on file    Gets together: Not on file    Attends religious service: Not on file    Active member of club or organization: Not on file    Attends meetings of clubs or organizations: Not on file  Relationship status: Not on file  . Intimate partner violence:    Fear of current or ex partner: Not on file    Emotionally abused: Not on file    Physically abused: Not on file    Forced sexual activity: Not on file  Other Topics Concern  . Not on file  Social History Narrative  . Not on file    Review of Systems: Review of Systems  Constitutional: Negative for chills and fever.  HENT: Negative.   Eyes: Negative.   Respiratory: Negative.   Cardiovascular: Negative.   Gastrointestinal: Negative.   Genitourinary: Negative.   Musculoskeletal: Positive for back pain, joint pain and neck pain.  Skin: Negative.   Neurological: Negative.  Negative for dizziness.  Endo/Heme/Allergies: Negative.   Psychiatric/Behavioral: Negative for  depression and suicidal ideas.    Physical Exam: There were no vitals taken for this visit. Physical Exam Constitutional:      Appearance: Normal appearance.  HENT:     Head: Normocephalic and atraumatic.     Nose: Nose normal.  Eyes:     Pupils: Pupils are equal, round, and reactive to light.  Neck:     Musculoskeletal: Normal range of motion.  Cardiovascular:     Rate and Rhythm: Normal rate and regular rhythm.     Pulses: Normal pulses.  Pulmonary:     Effort: Pulmonary effort is normal.     Breath sounds: Normal breath sounds.  Abdominal:     General: Bowel sounds are normal.  Musculoskeletal: Normal range of motion.  Skin:    General: Skin is warm and dry.  Neurological:     General: No focal deficit present.     Mental Status: He is alert. Mental status is at baseline.  Psychiatric:        Mood and Affect: Mood normal.        Behavior: Behavior normal.        Thought Content: Thought content normal.        Judgment: Judgment normal.      Lab results: No results found for this or any previous visit (from the past 24 hour(s)).  Imaging results:  No results found.   Assessment & Plan:  Patient will be admitted to the day infusion center for extended observation  Start IV D5.45 for cellular rehydration at 125/hr  Start Toradol 15 mg IV times one  Start Dilaudid PCA High Concentration per weight based protocol.   Tylenol 650 mg times one  Patient will be re-evaluated for pain intensity in the context of function and relationship to baseline as care progresses.  If no significant pain relief, will transfer patient to inpatient services for a higher level of care.   Will check CMP, reticulocytes, and CBC w/differential   Nolon NationsLachina Moore Mackena Plummer  APRN, MSN, FNP-C [facility] [address]  [place], [place] [address] [phone]   07/25/2018, 11:49 AM

## 2018-07-25 NOTE — Progress Notes (Addendum)
Patient admitted to the day hospital for treatment of sickle cell pain crisis. Patient reported pain rated 10 /10 in the back. Patient placed on Dilaudid PCA, given IV Toradol, IV Zofran and hydrated with IV fluids. At discharge patient reported his pain at 1/10. Discharge instructions given to patient. Patient alert, oriented and ambulatory at discharge. Patient told to possibly reschedule his next provider appointment to be earlier than presently scheduled for optimum pain management.

## 2018-07-25 NOTE — Telephone Encounter (Signed)
Patient called, complained of pain in the neck and back that he rated as 10/10. Denied chest pain, fever, diarrhea, abdominal pain, nausea/vomitting. Admitted to having means of transportation without driving self after treatment ("wife"). Last took Morphine 15 mg about 10:05am today. Provider notified; per provider, patient can come in for treatment. Patient notified, verbalized understanding.

## 2018-08-01 ENCOUNTER — Telehealth: Payer: Self-pay

## 2018-08-01 ENCOUNTER — Other Ambulatory Visit: Payer: Self-pay | Admitting: Family Medicine

## 2018-08-01 DIAGNOSIS — R200 Anesthesia of skin: Secondary | ICD-10-CM

## 2018-08-01 DIAGNOSIS — R202 Paresthesia of skin: Secondary | ICD-10-CM

## 2018-08-01 MED ORDER — GABAPENTIN 100 MG PO CAPS: 100.0000 mg | ORAL_CAPSULE | Freq: Every day | ORAL | 3 refills | Status: DC

## 2018-08-01 NOTE — Telephone Encounter (Signed)
Patient needs a refill on Morphine 15mg .

## 2018-08-01 NOTE — Telephone Encounter (Signed)
Patient states that the numbness has gotten worse in his right arm and he can't feel it at all. It has been like this since yesterday. Patient is currently at work and wants to know what he needs to do.

## 2018-08-02 ENCOUNTER — Other Ambulatory Visit: Payer: Self-pay | Admitting: Family Medicine

## 2018-08-02 DIAGNOSIS — G894 Chronic pain syndrome: Secondary | ICD-10-CM

## 2018-08-02 DIAGNOSIS — D582 Other hemoglobinopathies: Secondary | ICD-10-CM

## 2018-08-02 MED ORDER — MORPHINE SULFATE 15 MG PO TABS: 15.0000 mg | ORAL_TABLET | ORAL | 0 refills | Status: DC | PRN

## 2018-08-02 NOTE — Telephone Encounter (Signed)
Left a detailed vm 

## 2018-08-07 ENCOUNTER — Telehealth: Payer: Self-pay

## 2018-08-07 NOTE — Telephone Encounter (Signed)
Patient notified

## 2018-09-04 ENCOUNTER — Ambulatory Visit (INDEPENDENT_AMBULATORY_CARE_PROVIDER_SITE_OTHER): Payer: 59 | Admitting: Family Medicine

## 2018-09-04 ENCOUNTER — Encounter: Payer: Self-pay | Admitting: Family Medicine

## 2018-09-04 VITALS — BP 130/72 | HR 84 | Temp 97.7°F | Ht 68.0 in | Wt 164.2 lb

## 2018-09-04 DIAGNOSIS — Z09 Encounter for follow-up examination after completed treatment for conditions other than malignant neoplasm: Secondary | ICD-10-CM | POA: Diagnosis not present

## 2018-09-04 DIAGNOSIS — D571 Sickle-cell disease without crisis: Secondary | ICD-10-CM | POA: Diagnosis not present

## 2018-09-04 DIAGNOSIS — R6889 Other general symptoms and signs: Secondary | ICD-10-CM | POA: Diagnosis not present

## 2018-09-04 DIAGNOSIS — G894 Chronic pain syndrome: Secondary | ICD-10-CM

## 2018-09-04 DIAGNOSIS — F1190 Opioid use, unspecified, uncomplicated: Secondary | ICD-10-CM

## 2018-09-04 LAB — POCT URINALYSIS DIP (MANUAL ENTRY)
Bilirubin, UA: NEGATIVE
Blood, UA: NEGATIVE
Glucose, UA: NEGATIVE mg/dL
Ketones, POC UA: NEGATIVE mg/dL
Leukocytes, UA: NEGATIVE
Nitrite, UA: NEGATIVE
Protein Ur, POC: NEGATIVE mg/dL
Spec Grav, UA: 1.02 (ref 1.010–1.025)
Urobilinogen, UA: 0.2 E.U./dL
pH, UA: 5.5 (ref 5.0–8.0)

## 2018-09-04 MED ORDER — ACETAMINOPHEN 500 MG PO TABS: 500.0000 mg | ORAL_TABLET | Freq: Two times a day (BID) | ORAL | 3 refills | Status: DC

## 2018-09-04 NOTE — Progress Notes (Signed)
[facility]  Sickle Cell Anemia Follow Up  Subjective:  Patient ID: Austin [name], male    DOB: 15-Jun-1991  Age: 28 y.o. MRN: [id number]  CC:  Chief Complaint  Patient presents with  . Follow-up    Sickle cell  . Letter for parking at work   HPI Austin [name] is a 28 year old male who presents for Sickle Cell Anemia Follow Up.   Past Medical History:  Diagnosis Date  . Eczema   . Sickle cell anemia (HCC)    Current Status: Since his last office visit, he is doing well with no complaints. He states that he has pain in his arms and legs. He rates his pain today at 5/10. He has not has a hospital visit for Sickle Cell Crisis since 07/25/2018 where he was treated and discharged the same day. He is currently taking all medications as prescribed and staying well hydrated. He reports occasional dizziness and headaches. He states that he has been having a cough, sore throat, chills, sinus pressure X 1 week now.   He denies fevers, fatigue, recent infections, weight loss, and night sweats. He has not had any visual changes, and falls. No chest pain, heart palpitations, and shortness of breath reported. No reports of GI problems such as nausea, vomiting, diarrhea, and constipation. He has no reports of blood in stools, dysuria and hematuria. No depression or anxiety reported.  Past Surgical History:  Procedure Laterality Date  . WISDOM TOOTH EXTRACTION      Family History  Problem Relation Age of Onset  . Sickle cell trait Mother   . Diabetes Father   . Hypertension Father     Social History   Socioeconomic History  . Marital status: Married    Spouse name: Not on file  . Number of children: Not on file  . Years of education: Not on file  . Highest education level: Not on file  Occupational History  . Not on file  Social Needs  . Financial resource strain: Not on file  . Food insecurity:    Worry: Not on file   Inability: Not on file  . Transportation needs:    Medical: Not on file    Non-medical: Not on file  Tobacco Use  . Smoking status: Former Smoker    Packs/day: 0.10  . Smokeless tobacco: Never Used  Substance and Sexual Activity  . Alcohol use: Yes    Comment: occ  . Drug use: Yes    Types: Marijuana  . Sexual activity: Not on file  Lifestyle  . Physical activity:    Days per week: Not on file    Minutes per session: Not on file  . Stress: Not on file  Relationships  . Social connections:    Talks on phone: Not on file    Gets together: Not on file    Attends religious service: Not on file    Active member of club or organization: Not on file    Attends meetings of clubs or organizations: Not on file    Relationship status: Not on file  . Intimate partner violence:    Fear of current or ex partner: Not on file    Emotionally abused: Not on file    Physically abused: Not on file    Forced sexual activity: Not on file  Other Topics Concern  . Not on file  Social History Narrative  . Not on file  Outpatient Medications Prior to Visit  Medication Sig Dispense Refill  . diphenhydrAMINE (BENADRYL) 25 MG tablet Take 25 mg by mouth every 6 (six) hours as needed for allergies.    . folic acid (FOLVITE) 1 MG tablet Take 1 tablet (1 mg total) by mouth daily. 30 tablet 11  . gabapentin (NEURONTIN) 100 MG capsule Take 1 capsule (100 mg total) by mouth at bedtime. 90 capsule 3  . ibuprofen (ADVIL,MOTRIN) 800 MG tablet Take 1 tablet (800 mg total) by mouth every 8 (eight) hours as needed. 30 tablet 1  . ondansetron (ZOFRAN) 4 MG tablet Take 1 tablet (4 mg total) by mouth every 8 (eight) hours as needed for nausea or vomiting. 25 tablet 0  . predniSONE (DELTASONE) 10 MG tablet Take 1 tablet (10 mg total) by mouth daily with breakfast. 10 tablet 0   No facility-administered medications prior to visit.     No Known Allergies  ROS Review of Systems  Constitutional: Positive for  chills and fatigue.  HENT: Positive for sinus pressure and sore throat.   Eyes: Negative.   Respiratory: Positive for cough.   Cardiovascular: Negative.   Gastrointestinal: Negative.   Genitourinary: Negative.   Musculoskeletal: Negative.   Skin: Negative.   Allergic/Immunologic: Negative.   Neurological: Positive for dizziness and headaches.  Hematological: Negative.   Psychiatric/Behavioral: Negative.    Objective:    Physical Exam  Constitutional: He is oriented to person, place, and time. He appears well-developed and well-nourished.  HENT:  Head: Normocephalic and atraumatic.  Eyes: Conjunctivae are normal.  Neck: Normal range of motion. Neck supple.  Cardiovascular: Normal rate, regular rhythm, normal heart sounds and intact distal pulses.  Pulmonary/Chest: Effort normal and breath sounds normal.  Abdominal: Soft. Bowel sounds are normal.  Musculoskeletal: Normal range of motion.  Neurological: He is alert and oriented to person, place, and time. He has normal reflexes.  Skin: Skin is warm and dry.  Psychiatric: He has a normal mood and affect. His behavior is normal. Judgment and thought content normal.  Nursing note and vitals reviewed.   BP 130/72 (BP Location: Right Arm, Patient Position: Sitting, Cuff Size: Small)   Pulse 84   Temp 97.7 F (36.5 C) (Oral)   Ht 5\' 8"  (1.727 m)   Wt 164 lb 3.2 oz (74.5 kg)   SpO2 100%   BMI 24.97 kg/m  Wt Readings from Last 3 Encounters:  09/04/18 164 lb 3.2 oz (74.5 kg)  07/03/18 164 lb (74.4 kg)  06/09/18 159 lb (72.1 kg)     There are no preventive care reminders to display for this patient.  There are no preventive care reminders to display for this patient.  Lab Results  Component Value Date   TSH 0.662 08/12/2017   Lab Results  Component Value Date   WBC 9.9 07/25/2018   HGB 13.0 07/25/2018   HCT 37.3 (L) 07/25/2018   MCV 80.2 07/25/2018   PLT 138 (L) 07/25/2018   Lab Results  Component Value Date   NA  137 07/25/2018   K 3.7 07/25/2018   CO2 27 07/25/2018   GLUCOSE 116 (H) 07/25/2018   BUN 9 07/25/2018   CREATININE 0.87 07/25/2018   BILITOT 1.7 (H) 07/25/2018   ALKPHOS 44 07/25/2018   AST 14 (L) 07/25/2018   ALT 22 07/25/2018   PROT 7.8 07/25/2018   ALBUMIN 4.8 07/25/2018   CALCIUM 9.6 07/25/2018   ANIONGAP 9 07/25/2018   No results found for: CHOL No results found  for: HDL No results found for: LDLCALC No results found for: TRIG No results found for: [facility] Lab Results  Component Value Date   HGBA1C 4.2 08/12/2017   Assessment & Plan:   1. Sickle cell anemia without crisis (HCC) We will add Acetaminophen today. He is doing well today. He will continue to take pain medications as prescribed; will continue to avoid extreme heat and cold; will continue to eat a healthy diet and drink at least 64 ounces of water daily; continue stool softener as needed; will avoid colds and flu; will continue to get plenty of sleep and rest; will continue to avoid high stressful situations and remain infection free; will continue Folic Acid 1 mg daily to avoid sickle cell crisis.  - acetaminophen (TYLENOL) 500 MG tablet; Take 1 tablet (500 mg total) by mouth 2 (two) times daily.  Dispense: 60 tablet; Refill: 3  2. Chronic, continuous use of opioids  3. Chronic pain syndrome  4. Flu-like symptoms Patient refused testing for Influenza and Strep testing.   5. Follow up He will follow up in 2 months.   Meds ordered this encounter  Medications  . acetaminophen (TYLENOL) 500 MG tablet    Sig: Take 1 tablet (500 mg total) by mouth 2 (two) times daily.    Dispense:  60 tablet    Refill:  3   Orders Placed This Encounter  Procedures  . POCT urinalysis dipstick    Raliegh IpNatalie Dayanis Bergquist,  MSN, FNP-C [facility] [address]  [place], [place] [address] [phone]   Problem List Items Addressed This Visit    None    Visit Diagnoses    Sickle cell  anemia without crisis (HCC)    -  Primary   Relevant Medications   acetaminophen (TYLENOL) 500 MG tablet   Chronic, continuous use of opioids       Chronic pain syndrome       Flu-like symptoms       Follow up       Relevant Orders   POCT urinalysis dipstick (Completed)      Meds ordered this encounter  Medications  . acetaminophen (TYLENOL) 500 MG tablet    Sig: Take 1 tablet (500 mg total) by mouth 2 (two) times daily.    Dispense:  60 tablet    Refill:  3    Follow-up: Return in about 1 month (around 10/03/2018).    Kallie LocksNatalie M Isreal Moline, FNP

## 2018-09-21 ENCOUNTER — Encounter: Payer: Self-pay | Admitting: Family Medicine

## 2018-09-30 ENCOUNTER — Encounter (HOSPITAL_COMMUNITY): Payer: Self-pay

## 2018-09-30 ENCOUNTER — Emergency Department (HOSPITAL_COMMUNITY)
Admission: EM | Admit: 2018-09-30 | Discharge: 2018-09-30 | Disposition: A | Discharge status: Home / Self Care | Payer: 59 | Attending: Emergency Medicine | Admitting: Emergency Medicine

## 2018-09-30 ENCOUNTER — Other Ambulatory Visit: Payer: Self-pay

## 2018-09-30 DIAGNOSIS — Z79899 Other long term (current) drug therapy: Secondary | ICD-10-CM | POA: Diagnosis not present

## 2018-09-30 DIAGNOSIS — R0981 Nasal congestion: Secondary | ICD-10-CM | POA: Diagnosis present

## 2018-09-30 DIAGNOSIS — Z87891 Personal history of nicotine dependence: Secondary | ICD-10-CM | POA: Diagnosis not present

## 2018-09-30 DIAGNOSIS — J0100 Acute maxillary sinusitis, unspecified: Secondary | ICD-10-CM | POA: Insufficient documentation

## 2018-09-30 DIAGNOSIS — D571 Sickle-cell disease without crisis: Secondary | ICD-10-CM | POA: Diagnosis not present

## 2018-09-30 LAB — INFLUENZA PANEL BY PCR (TYPE A & B)
Influenza A By PCR: NEGATIVE
Influenza B By PCR: NEGATIVE

## 2018-09-30 LAB — GROUP A STREP BY PCR: Group A Strep by PCR: NOT DETECTED

## 2018-09-30 MED ORDER — IPRATROPIUM-ALBUTEROL 0.5-2.5 (3) MG/3ML IN SOLN
3.0000 mL | Freq: Once | RESPIRATORY_TRACT | Status: AC
  Administered 2018-09-30: 3 mL via RESPIRATORY_TRACT
  Filled 2018-09-30: qty 3

## 2018-09-30 MED ORDER — AMOXICILLIN-POT CLAVULANATE 875-125 MG PO TABS: 1.0000 | ORAL_TABLET | Freq: Two times a day (BID) | ORAL | 0 refills | Status: DC

## 2018-09-30 MED ORDER — LORATADINE 10 MG PO TABS: 10.0000 mg | ORAL_TABLET | Freq: Every day | ORAL | 0 refills | Status: DC

## 2018-09-30 NOTE — ED Notes (Signed)
Patient given discharge teaching and verbalized understanding. Patient ambulated out of ED with a steady gait. 

## 2018-09-30 NOTE — ED Provider Notes (Signed)
[facility]-EMERGENCY DEPT Provider Note   CSN: [id number] Arrival date & time: 09/30/18  1024    History   Chief Complaint Chief Complaint  Patient presents with  . Nasal Congestion    HPI Austin [name] is a 28 y.o. male with hx of sickle cell who presents to the ED with sinus congestion and headache that started yesterday. Today he c/o fever, chills and sore throat.      The history is provided by the patient. No language interpreter was used.  URI  Presenting symptoms: congestion, facial pain, fatigue, fever and sore throat   Presenting symptoms: no cough and no ear pain   Severity:  Moderate Onset quality:  Gradual Duration:  1 day Timing:  Constant Progression:  Worsening Chronicity:  New Worsened by:  Nothing Ineffective treatments:  OTC medications Associated symptoms: myalgias and sinus pain   Risk factors: sick contacts     Past Medical History:  Diagnosis Date  . Eczema   . Sickle cell anemia [facility])     Patient Active Problem List   Diagnosis Date Noted  . Sickle cell pain crisis (HCC) 05/18/2018  . Acute sickle cell crisis (HCC) 12/15/2017  . Thrombocytopenia (HCC) 12/15/2017  . Splenomegaly 12/15/2017  . Sickle cell anemia with crisis (HCC) 12/15/2017  . 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 Spleen enlarged 08/18/2017    Past Surgical History:  Procedure Laterality Date  . WISDOM TOOTH EXTRACTION          Home Medications    Prior to Admission medications   Medication Sig Start Date End Date Taking? Authorizing Provider  acetaminophen (TYLENOL) 500 MG tablet Take 1 tablet (500 mg total) by mouth 2 (two) times daily. 09/04/18   Kallie Locks, FNP  amoxicillin-clavulanate (AUGMENTIN) 875-125 MG tablet Take 1 tablet by mouth every 12 (twelve) hours. 09/30/18   Janne Napoleon, NP  diphenhydrAMINE (BENADRYL) 25 MG tablet Take 25 mg by mouth every 6 (six) hours as needed for allergies.    [provider]    folic acid (FOLVITE) 1 MG tablet Take 1 tablet (1 mg total) by mouth daily. 04/17/18   Kallie Locks, FNP  gabapentin (NEURONTIN) 100 MG capsule Take 1 capsule (100 mg total) by mouth at bedtime. 08/01/18   Kallie Locks, FNP  ibuprofen (ADVIL,MOTRIN) 800 MG tablet Take 1 tablet (800 mg total) by mouth every 8 (eight) hours as needed. 05/10/18   Kallie Locks, FNP  loratadine (CLARITIN) 10 MG tablet Take 1 tablet (10 mg total) by mouth daily. 09/30/18   Janne Napoleon, NP  ondansetron (ZOFRAN) 4 MG tablet Take 1 tablet (4 mg total) by mouth every 8 (eight) hours as needed for nausea or vomiting. 12/20/17   Kallie Locks, FNP    Family History Family History  Problem Relation Age of Onset  . Sickle cell trait Mother   . Diabetes Father   . Hypertension Father     Social History Social History   Tobacco Use  . Smoking status: Former Smoker    Packs/day: 0.10  . Smokeless tobacco: Never Used  Substance Use Topics  . Alcohol use: Not Currently    Comment: occ  . Drug use: Yes    Types: Marijuana     Allergies   Patient has no known allergies.   Review of Systems Review of Systems  Constitutional: Positive for fatigue and fever.  HENT: Positive for congestion, sinus pain and sore throat.  Negative for ear pain.   Respiratory: Negative for cough.   Musculoskeletal: Positive for myalgias.     Physical Exam Updated Vital Signs BP 125/82 (BP Location: Left Arm)   Pulse 79   Temp 98.7 F (37.1 C) (Oral)   Resp 16   Ht 5\' 8"  (1.727 m)   Wt 81.6 kg   SpO2 99%   BMI 27.37 kg/m   Physical Exam Vitals signs and nursing note reviewed.  Constitutional:      General: He is not in acute distress.    Appearance: He is well-developed.  HENT:     Head: Normocephalic.     Right Ear: Tympanic membrane normal.     Left Ear: Tympanic membrane normal.     Nose: Congestion present.     Left Sinus: Maxillary sinus tenderness present.     Mouth/Throat:     Mouth:  Mucous membranes are moist.  Eyes:     Extraocular Movements: Extraocular movements intact.     Conjunctiva/sclera: Conjunctivae normal.  Neck:     Musculoskeletal: Neck supple. No neck rigidity.  Cardiovascular:     Rate and Rhythm: Normal rate and regular rhythm.  Pulmonary:     Effort: Pulmonary effort is normal.     Breath sounds: Normal breath sounds.  Abdominal:     Palpations: Abdomen is soft.     Tenderness: There is no abdominal tenderness.  Musculoskeletal: Normal range of motion.  Lymphadenopathy:     Cervical: Cervical adenopathy present.  Skin:    General: Skin is warm and dry.  Neurological:     Mental Status: He is alert and oriented to person, place, and time.  Psychiatric:        Mood and Affect: Mood normal.      ED Treatments / Results  Labs (all labs ordered are listed, but only abnormal results are displayed) Labs Reviewed  GROUP A STREP BY PCR  INFLUENZA PANEL BY PCR (TYPE A & B)   Radiology No results found.  Procedures Procedures (including critical care time)  Medications Ordered in ED Medications  ipratropium-albuterol (DUONEB) 0.5-2.5 (3) MG/3ML nebulizer solution 3 mL (3 mLs Nebulization Given 09/30/18 1146)     Initial Impression / Assessment and Plan / ED Course  I have reviewed the triage vital signs and the nursing notes. 28 y.o. male here with sinus congestion and sinus pressure stable for d/c without fever and does not appear toxic. No c/o sickle cell problems today. Will treat for sinusitis. Return precautions discussed.   Final Clinical Impressions(s) / ED Diagnoses   Final diagnoses:  Acute maxillary sinusitis, recurrence not specified    ED Discharge Orders         Ordered    amoxicillin-clavulanate (AUGMENTIN) 875-125 MG tablet  Every 12 hours     09/30/18 1220    loratadine (CLARITIN) 10 MG tablet  Daily     09/30/18 1220           Kerrie Buffalo [place], [place] 09/30/18 1522    Tegeler, Canary Brim, MD 09/30/18  1531

## 2018-09-30 NOTE — ED Triage Notes (Signed)
Patient c/o sinus pain and pressure since yesterday. Patient states he has been using Vick's nasal spray yesterday and today. Patient states when his nose startted to drain he had blood in the drainage.

## 2018-09-30 NOTE — Discharge Instructions (Addendum)
Follow up with your doctor. Return here for worsening symptoms.  °

## 2018-10-03 ENCOUNTER — Encounter: Payer: Self-pay | Admitting: Family Medicine

## 2018-10-03 ENCOUNTER — Ambulatory Visit (INDEPENDENT_AMBULATORY_CARE_PROVIDER_SITE_OTHER): Payer: 59 | Admitting: Family Medicine

## 2018-10-03 ENCOUNTER — Telehealth: Payer: Self-pay

## 2018-10-03 VITALS — BP 114/78 | HR 98 | Temp 97.5°F | Ht 68.0 in | Wt 165.0 lb

## 2018-10-03 DIAGNOSIS — Z09 Encounter for follow-up examination after completed treatment for conditions other than malignant neoplasm: Secondary | ICD-10-CM | POA: Diagnosis not present

## 2018-10-03 DIAGNOSIS — D582 Other hemoglobinopathies: Secondary | ICD-10-CM

## 2018-10-03 DIAGNOSIS — J302 Other seasonal allergic rhinitis: Secondary | ICD-10-CM | POA: Diagnosis not present

## 2018-10-03 DIAGNOSIS — G894 Chronic pain syndrome: Secondary | ICD-10-CM | POA: Diagnosis not present

## 2018-10-03 DIAGNOSIS — F1190 Opioid use, unspecified, uncomplicated: Secondary | ICD-10-CM

## 2018-10-03 LAB — POCT URINALYSIS DIP (MANUAL ENTRY)
Blood, UA: NEGATIVE
Glucose, UA: NEGATIVE mg/dL
Leukocytes, UA: NEGATIVE
Nitrite, UA: NEGATIVE
Spec Grav, UA: 1.025 (ref 1.010–1.025)
Urobilinogen, UA: 2 E.U./dL — AB
pH, UA: 5.5 (ref 5.0–8.0)

## 2018-10-03 MED ORDER — MORPHINE SULFATE 15 MG PO TABS: 15.0000 mg | ORAL_TABLET | ORAL | 0 refills | Status: DC | PRN

## 2018-10-03 MED ORDER — LORATADINE 10 MG PO TABS: 10.0000 mg | ORAL_TABLET | Freq: Every day | ORAL | 6 refills | Status: DC

## 2018-10-03 NOTE — Progress Notes (Signed)
[facility]  Hospital Follow Up--Sickle Cell Anemia  Subjective:  Patient ID: Austin [name], male    DOB: October 12, 1990  Age: 28 y.o. MRN: [id number]  CC:  Chief Complaint  Patient presents with  . Follow-up    Sickle cell   HPI Austin [name] is a 28 year old male who presents for Hospital Follow Up for Sickle Cell Anemia today.   Past Medical History:  Diagnosis Date  . Acute maxillary sinusitis   . Eczema   . Sickle cell anemia (HCC)    Current Status: Since his last office visit on 09/04/2018, he has had a Acute Maxillary Sinusitis. He was placed on an antibiotic and Claritin. He is doing well with no complaints. He states that he has chronic pain in his arms and legs. He rates his pain today at 0/10. He has not has a hospital visit for Sickle Cell Crisis since 07/25/2018 where he was treated and discharged the same day. He is currently taking all medications as prescribed and staying well hydrated. He reports occasional nausea, dizziness and headaches.   He denies fevers, chills, fatigue, re cent infections, weight loss, and night sweats. He has not had any visual changes, and falls. No chest pain, heart palpitations, cough and shortness of breath reported. No reports of GI problems such as vomiting, diarrhea, and constipation. He has no reports of blood in stools, dysuria and hematuria. No depression or anxiety reported. He denies pain today.   Past Surgical History:  Procedure Laterality Date  . WISDOM TOOTH EXTRACTION      Family History  Problem Relation Age of Onset  . Sickle cell trait Mother   . Diabetes Father   . Hypertension Father     Social History   Socioeconomic History  . Marital status: Married    Spouse name: Not on file  . Number of children: Not on file  . Years of education: Not on file  . Highest education level: Not on file  Occupational History  . Not on file  Social Needs  .  Financial resource strain: Not on file  . Food insecurity:    Worry: Not on file    Inability: Not on file  . Transportation needs:    Medical: Not on file    Non-medical: Not on file  Tobacco Use  . Smoking status: Former Smoker    Packs/day: 0.10  . Smokeless tobacco: Never Used  Substance and Sexual Activity  . Alcohol use: Not Currently    Comment: occ  . Drug use: Yes    Types: Marijuana  . Sexual activity: Not on file  Lifestyle  . Physical activity:    Days per week: Not on file    Minutes per session: Not on file  . Stress: Not on file  Relationships  . Social connections:    Talks on phone: Not on file    Gets together: Not on file    Attends religious service: Not on file    Active member of club or organization: Not on file    Attends meetings of clubs or organizations: Not on file    Relationship status: Not on file  . Intimate partner violence:    Fear of current or ex partner: Not on file    Emotionally abused: Not on file    Physically abused: Not on file    Forced sexual activity: Not on file  Other Topics Concern  .  Not on file  Social History Narrative  . Not on file    Outpatient Medications Prior to Visit  Medication Sig Dispense Refill  . acetaminophen (TYLENOL) 500 MG tablet Take 1 tablet (500 mg total) by mouth 2 (two) times daily. 60 tablet 3  . amoxicillin-clavulanate (AUGMENTIN) 875-125 MG tablet Take 1 tablet by mouth every 12 (twelve) hours. 14 tablet 0  . diphenhydrAMINE (BENADRYL) 25 MG tablet Take 25 mg by mouth every 6 (six) hours as needed for allergies.    . folic acid (FOLVITE) 1 MG tablet Take 1 tablet (1 mg total) by mouth daily. 30 tablet 11  . gabapentin (NEURONTIN) 100 MG capsule Take 1 capsule (100 mg total) by mouth at bedtime. 90 capsule 3  . ibuprofen (ADVIL,MOTRIN) 800 MG tablet Take 1 tablet (800 mg total) by mouth every 8 (eight) hours as needed. 30 tablet 1  . loratadine (CLARITIN) 10 MG tablet Take 1 tablet (10 mg  total) by mouth daily. 14 tablet 0  . ondansetron (ZOFRAN) 4 MG tablet Take 1 tablet (4 mg total) by mouth every 8 (eight) hours as needed for nausea or vomiting. (Patient not taking: Reported on 10/03/2018) 25 tablet 0   No facility-administered medications prior to visit.     No Known Allergies  ROS Review of Systems  Constitutional: Negative.   HENT: Negative.   Eyes: Negative.   Respiratory: Negative.   Cardiovascular: Negative.   Gastrointestinal: Positive for nausea (Occasional).  Endocrine: Negative.   Genitourinary: Negative.   Musculoskeletal: Negative.   Skin: Negative.   Allergic/Immunologic: Negative.   Neurological: Positive for dizziness and headaches.  Hematological: Negative.   Psychiatric/Behavioral: Negative.    Objective:    Physical Exam  Constitutional: He is oriented to person, place, and time. He appears well-developed and well-nourished.  HENT:  Head: Normocephalic and atraumatic.  Eyes: Conjunctivae are normal.  Neck: Normal range of motion. Neck supple.  Cardiovascular: Normal rate, regular rhythm, normal heart sounds and intact distal pulses.  Pulmonary/Chest: Effort normal and breath sounds normal.  Abdominal: Soft. Bowel sounds are normal.  Musculoskeletal: Normal range of motion.  Neurological: He is alert and oriented to person, place, and time. He has normal reflexes.  Skin: Skin is dry.  Psychiatric: He has a normal mood and affect. His behavior is normal. Thought content normal.  Nursing note and vitals reviewed.   BP 114/78 (BP Location: Left Arm, Patient Position: Sitting, Cuff Size: Small)   Pulse 98   Temp (!) 97.5 F (36.4 C) (Oral)   Ht 5\' 8"  (1.727 m)   Wt 165 lb (74.8 kg)   SpO2 100%   BMI 25.09 kg/m  Wt Readings from Last 3 Encounters:  10/03/18 165 lb (74.8 kg)  09/30/18 180 lb (81.6 kg)  09/04/18 164 lb 3.2 oz (74.5 kg)   There are no preventive care reminders to display for this patient.  There are no preventive  care reminders to display for this patient.  Lab Results  Component Value Date   TSH 0.662 08/12/2017   Lab Results  Component Value Date   WBC 13.0 (H) 10/06/2018   HGB 13.8 10/06/2018   HCT 40.3 10/06/2018   MCV 79.6 (L) 10/06/2018   PLT 119 (L) 10/06/2018   Lab Results  Component Value Date   NA 137 10/06/2018   K 3.8 10/06/2018   CO2 23 10/06/2018   GLUCOSE 115 (H) 10/06/2018   BUN 10 10/06/2018   CREATININE 0.74 10/06/2018   BILITOT  2.7 (H) 10/06/2018   ALKPHOS 65 10/06/2018   AST 12 (L) 10/06/2018   ALT 19 10/06/2018   PROT 9.6 (H) 10/06/2018   ALBUMIN 5.6 (H) 10/06/2018   CALCIUM 10.4 (H) 10/06/2018   ANIONGAP 12 10/06/2018   No results found for: CHOL No results found for: HDL No results found for: LDLCALC No results found for: TRIG No results found for: [facility] Lab Results  Component Value Date   HGBA1C 4.2 08/12/2017   Assessment & Plan:   1. Hemoglobin C-C disease (HCC) She is doing well today. She will continue to take pain medications as prescribed; will continue to avoid extreme heat and cold; will continue to eat a healthy diet and drink at least 64 ounces of water daily; continue stool softener as needed; will avoid colds and flu; will continue to get plenty of sleep and rest; will continue to avoid high stressful situations and remain infection free; will continue Folic Acid 1 mg daily to avoid sickle cell crisis.  - morphine (MSIR) 15 MG tablet; Take 1 tablet (15 mg total) by mouth every 4 (four) hours as needed for up to 15 days for severe pain.  Dispense: 90 tablet; Refill: 0  2. Chronic pain syndrome - morphine (MSIR) 15 MG tablet; Take 1 tablet (15 mg total) by mouth every 4 (four) hours as needed for up to 15 days for severe pain.  Dispense: 90 tablet; Refill: 0  3. Chronic, continuous use of opioids  4. Seasonal allergies - loratadine (CLARITIN) 10 MG tablet; Take 1 tablet (10 mg total) by mouth daily.  Dispense: 30 tablet; Refill: 6  5.  Follow up He will follow up in 2 months.  - POCT urinalysis dipstick  Meds ordered this encounter  Medications  . loratadine (CLARITIN) 10 MG tablet    Sig: Take 1 tablet (10 mg total) by mouth daily.    Dispense:  30 tablet    Refill:  6  . morphine (MSIR) 15 MG tablet    Sig: Take 1 tablet (15 mg total) by mouth every 4 (four) hours as needed for up to 15 days for severe pain.    Dispense:  90 tablet    Refill:  0    Order Specific Question:   Supervising Provider    Answer:   Quentin Angst [id number]    Orders Placed This Encounter  Procedures  . POCT urinalysis dipstick    Referral Orders  No referral(s) requested today    [name],  MSN, FNP-C [facility] [address] [place], [place] [address] [phone]  Problem List Items Addressed This Visit      Other   Hemoglobin C-C disease (HCC) - Primary   Relevant Medications   morphine (MSIR) 15 MG tablet    Other Visit Diagnoses    Chronic pain syndrome       Relevant Medications   morphine (MSIR) 15 MG tablet   Chronic, continuous use of opioids       Seasonal allergies       Relevant Medications   loratadine (CLARITIN) 10 MG tablet   Follow up       Relevant Orders   POCT urinalysis dipstick (Completed)      Meds ordered this encounter  Medications  . loratadine (CLARITIN) 10 MG tablet    Sig: Take 1 tablet (10 mg total) by mouth daily.    Dispense:  30 tablet    Refill:  6  .  morphine (MSIR) 15 MG tablet    Sig: Take 1 tablet (15 mg total) by mouth every 4 (four) hours as needed for up to 15 days for severe pain.    Dispense:  90 tablet    Refill:  0    Order Specific Question:   Supervising Provider    Answer:   Quentin Angst [id number]    Follow-up: No follow-ups on file.    Kallie Locks, FNP

## 2018-10-03 NOTE — Telephone Encounter (Signed)
Patient will call and give me the dates for the [facility]

## 2018-10-04 NOTE — Telephone Encounter (Signed)
Patient will needs the 2/21,2/28,29 and also for appointments. Patient advise that he has a deductible on insurance.

## 2018-10-05 ENCOUNTER — Other Ambulatory Visit: Payer: Self-pay | Admitting: Family Medicine

## 2018-10-05 NOTE — Telephone Encounter (Signed)
Patient notified that paperwork has been sent off

## 2018-10-06 ENCOUNTER — Emergency Department (HOSPITAL_COMMUNITY)
Admission: EM | Admit: 2018-10-06 | Discharge: 2018-10-06 | Disposition: A | Discharge status: Home / Self Care | Payer: 59 | Attending: Emergency Medicine | Admitting: Emergency Medicine

## 2018-10-06 ENCOUNTER — Encounter (HOSPITAL_COMMUNITY): Payer: Self-pay | Admitting: Emergency Medicine

## 2018-10-06 ENCOUNTER — Other Ambulatory Visit: Payer: Self-pay

## 2018-10-06 DIAGNOSIS — E860 Dehydration: Secondary | ICD-10-CM | POA: Diagnosis not present

## 2018-10-06 DIAGNOSIS — K529 Noninfective gastroenteritis and colitis, unspecified: Secondary | ICD-10-CM | POA: Insufficient documentation

## 2018-10-06 DIAGNOSIS — D5700 Hb-SS disease with crisis, unspecified: Secondary | ICD-10-CM | POA: Diagnosis not present

## 2018-10-06 DIAGNOSIS — Z79899 Other long term (current) drug therapy: Secondary | ICD-10-CM | POA: Insufficient documentation

## 2018-10-06 DIAGNOSIS — Z87891 Personal history of nicotine dependence: Secondary | ICD-10-CM | POA: Diagnosis not present

## 2018-10-06 DIAGNOSIS — R112 Nausea with vomiting, unspecified: Secondary | ICD-10-CM | POA: Diagnosis not present

## 2018-10-06 DIAGNOSIS — M5489 Other dorsalgia: Secondary | ICD-10-CM | POA: Diagnosis not present

## 2018-10-06 LAB — COMPREHENSIVE METABOLIC PANEL
ALT: 19 U/L (ref 0–44)
AST: 12 U/L — ABNORMAL LOW (ref 15–41)
Albumin: 5.6 g/dL — ABNORMAL HIGH (ref 3.5–5.0)
Alkaline Phosphatase: 65 U/L (ref 38–126)
Anion gap: 12 (ref 5–15)
BUN: 10 mg/dL (ref 6–20)
CO2: 23 mmol/L (ref 22–32)
Calcium: 10.4 mg/dL — ABNORMAL HIGH (ref 8.9–10.3)
Chloride: 102 mmol/L (ref 98–111)
Creatinine, Ser: 0.74 mg/dL (ref 0.61–1.24)
GFR calc non Af Amer: >60 mL/min (ref >60)
Glucose, Bld: 115 mg/dL — ABNORMAL HIGH (ref 70–99)
Potassium: 3.8 mmol/L (ref 3.5–5.1)
Sodium: 137 mmol/L (ref 135–145)
Total Bilirubin: 2.7 mg/dL — ABNORMAL HIGH (ref 0.3–1.2)
Total Protein: 9.6 g/dL — ABNORMAL HIGH (ref 6.5–8.1)

## 2018-10-06 LAB — CBC WITH DIFFERENTIAL/PLATELET
Abs Immature Granulocytes: 0.06 10*3/uL (ref 0.00–0.07)
BASOS PCT: 0 %
Basophils Absolute: 0 10*3/uL (ref 0.0–0.1)
Eosinophils Absolute: 0 10*3/uL (ref 0.0–0.5)
Eosinophils Relative: 0 %
HCT: 40.3 % (ref 39.0–52.0)
Hemoglobin: 13.8 g/dL (ref 13.0–17.0)
Immature Granulocytes: 1 %
LYMPHS ABS: 1.3 10*3/uL (ref 0.7–4.0)
Lymphocytes Relative: 10 %
MCH: 27.3 pg (ref 26.0–34.0)
MCHC: 34.2 g/dL (ref 30.0–36.0)
MCV: 79.6 fL — ABNORMAL LOW (ref 80.0–100.0)
Monocytes Absolute: 1 10*3/uL (ref 0.1–1.0)
Monocytes Relative: 8 %
NEUTROS PCT: 81 %
Neutro Abs: 10.6 10*3/uL — ABNORMAL HIGH (ref 1.7–7.7)
Platelets: 119 10*3/uL — ABNORMAL LOW (ref 150–400)
RBC: 5.06 MIL/uL (ref 4.22–5.81)
RDW: 14.6 % (ref 11.5–15.5)
WBC: 13 10*3/uL — ABNORMAL HIGH (ref 4.0–10.5)
nRBC: 0.2 % (ref 0.0–0.2)

## 2018-10-06 LAB — LIPASE, BLOOD: Lipase: 25 U/L (ref 11–51)

## 2018-10-06 LAB — INFLUENZA PANEL BY PCR (TYPE A & B)
Influenza A By PCR: NEGATIVE
Influenza B By PCR: NEGATIVE

## 2018-10-06 LAB — RETICULOCYTES
Immature Retic Fract: 27 % — ABNORMAL HIGH (ref 2.3–15.9)
RBC.: 5.06 MIL/uL (ref 4.22–5.81)
RETIC CT PCT: 3.8 % — AB (ref 0.4–3.1)
Retic Count, Absolute: 193.3 10*3/uL — ABNORMAL HIGH (ref 19.0–186.0)

## 2018-10-06 MED ORDER — ONDANSETRON HCL 4 MG/2ML IJ SOLN
4.0000 mg | Freq: Once | INTRAMUSCULAR | Status: AC
  Administered 2018-10-06: 4 mg via INTRAVENOUS
  Filled 2018-10-06: qty 2

## 2018-10-06 MED ORDER — HYDROMORPHONE HCL 1 MG/ML IJ SOLN
1.0000 mg | Freq: Once | INTRAMUSCULAR | Status: AC
  Administered 2018-10-06: 1 mg via INTRAVENOUS
  Filled 2018-10-06: qty 1

## 2018-10-06 MED ORDER — HYDROMORPHONE HCL 1 MG/ML IJ SOLN
1.0000 mg | INTRAMUSCULAR | Status: AC | PRN
  Administered 2018-10-06 (×3): 1 mg via INTRAVENOUS
  Filled 2018-10-06 (×3): qty 1

## 2018-10-06 MED ORDER — HYDROMORPHONE HCL 1 MG/ML IJ SOLN: 1.0000 mg | INTRAMUSCULAR | Status: DC | PRN

## 2018-10-06 MED ORDER — SODIUM CHLORIDE 0.9 % IV BOLUS
1000.0000 mL | Freq: Once | INTRAVENOUS | Status: AC
  Administered 2018-10-06: 1000 mL via INTRAVENOUS

## 2018-10-06 MED ORDER — ONDANSETRON 8 MG PO TBDP: 8.0000 mg | ORAL_TABLET | Freq: Three times a day (TID) | ORAL | 0 refills | Status: DC | PRN

## 2018-10-06 NOTE — ED Notes (Signed)
EKG has been completed

## 2018-10-06 NOTE — ED Triage Notes (Signed)
Patient here from home with complaints of sickle cell pain crisis that started last night. Also reports "food poisoning" n/v/d that started last night. Back pain. Unable to keep home meds down.

## 2018-10-10 NOTE — ED Provider Notes (Signed)
[facility]-EMERGENCY DEPT Provider Note   CSN: [id number] Arrival date & time: 10/06/18  0848    History   Chief Complaint Chief Complaint  Patient presents with  . Sickle Cell Pain Crisis  . Nausea  . Emesis  . Back Pain    HPI Austin [name] is a 28 y.o. male.     HPI Patient is a 28 year old male with a history of sickle cell disease who presents the emergency department with complaints of sickle cell related pain as well as nausea vomiting and diarrhea that began last night.  He has some back pain.  He is unable to keep his home meds down which include morphine.  He denies focal abdominal pain.  Denies fevers.  No chest pain or shortness of breath.  Symptoms are moderate to severe in severity.    Past Medical History:  Diagnosis Date  . Acute maxillary sinusitis   . Eczema   . Sickle cell anemia [facility])     Patient Active Problem List   Diagnosis Date Noted  . Sickle cell pain crisis (HCC) 05/18/2018  . Acute sickle cell crisis (HCC) 12/15/2017  . Thrombocytopenia (HCC) 12/15/2017  . Splenomegaly 12/15/2017  . Sickle cell anemia with crisis (HCC) 12/15/2017  . 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 Spleen enlarged 08/18/2017    Past Surgical History:  Procedure Laterality Date  . WISDOM TOOTH EXTRACTION          Home Medications    Prior to Admission medications   Medication Sig Start Date End Date Taking? Authorizing Provider  acetaminophen (TYLENOL) 500 MG tablet Take 1 tablet (500 mg total) by mouth 2 (two) times daily. 09/04/18   Kallie Locks, FNP  amoxicillin-clavulanate (AUGMENTIN) 875-125 MG tablet Take 1 tablet by mouth every 12 (twelve) hours. 09/30/18   Janne Napoleon, NP  diphenhydrAMINE (BENADRYL) 25 MG tablet Take 25 mg by mouth every 6 (six) hours as needed for allergies.    [provider]  folic acid (FOLVITE) 1 MG tablet Take 1 tablet (1 mg total) by mouth daily. 04/17/18   Kallie Locks, FNP   gabapentin (NEURONTIN) 100 MG capsule Take 1 capsule (100 mg total) by mouth at bedtime. 08/01/18   Kallie Locks, FNP  ibuprofen (ADVIL,MOTRIN) 800 MG tablet Take 1 tablet (800 mg total) by mouth every 8 (eight) hours as needed. 05/10/18   Kallie Locks, FNP  loratadine (CLARITIN) 10 MG tablet Take 1 tablet (10 mg total) by mouth daily. 10/03/18   Kallie Locks, FNP  morphine (MSIR) 15 MG tablet Take 1 tablet (15 mg total) by mouth every 4 (four) hours as needed for up to 15 days for severe pain. 10/03/18 10/18/18  Kallie Locks, FNP  ondansetron (ZOFRAN ODT) 8 MG disintegrating tablet Take 1 tablet (8 mg total) by mouth every 8 (eight) hours as needed for nausea or vomiting. 10/06/18   Azalia Bilis, MD  ondansetron (ZOFRAN) 4 MG tablet Take 1 tablet (4 mg total) by mouth every 8 (eight) hours as needed for nausea or vomiting. Patient not taking: Reported on 10/03/2018 12/20/17   Kallie Locks, FNP    Family History Family History  Problem Relation Age of Onset  . Sickle cell trait Mother   . Diabetes Father   . Hypertension Father     Social History Social History   Tobacco Use  . Smoking status: Former Smoker    Packs/day: 0.10  . Smokeless tobacco:  Never Used  Substance Use Topics  . Alcohol use: Not Currently    Comment: occ  . Drug use: Yes    Types: Marijuana     Allergies   Patient has no known allergies.   Review of Systems Review of Systems  All other systems reviewed and are negative.    Physical Exam Updated Vital Signs BP 115/74   Pulse 69   Temp 97.9 F (36.6 C) (Oral)   Resp 11   SpO2 97%   Physical Exam Vitals signs and nursing note reviewed.  Constitutional:      Appearance: He is well-developed.  HENT:     Head: Normocephalic and atraumatic.  Neck:     Musculoskeletal: Normal range of motion.  Cardiovascular:     Rate and Rhythm: Normal rate and regular rhythm.     Heart sounds: Normal heart sounds.  Pulmonary:     Effort:  Pulmonary effort is normal. No respiratory distress.     Breath sounds: Normal breath sounds.  Abdominal:     General: There is no distension.     Palpations: Abdomen is soft.     Tenderness: There is no abdominal tenderness.  Musculoskeletal: Normal range of motion.  Skin:    General: Skin is warm and dry.  Neurological:     Mental Status: He is alert and oriented to person, place, and time.  Psychiatric:        Judgment: Judgment normal.      ED Treatments / Results  Labs (all labs ordered are listed, but only abnormal results are displayed) Labs Reviewed  COMPREHENSIVE METABOLIC PANEL - Abnormal; Notable for the following components:      Result Value   Glucose, Bld 115 (*)    Calcium 10.4 (*)    Total Protein 9.6 (*)    Albumin 5.6 (*)    AST 12 (*)    Total Bilirubin 2.7 (*)    All other components within normal limits  CBC WITH DIFFERENTIAL/PLATELET - Abnormal; Notable for the following components:   WBC 13.0 (*)    MCV 79.6 (*)    Platelets 119 (*)    Neutro Abs 10.6 (*)    All other components within normal limits  RETICULOCYTES - Abnormal; Notable for the following components:   Retic Ct Pct 3.8 (*)    Retic Count, Absolute 193.3 (*)    Immature Retic Fract 27.0 (*)    All other components within normal limits  LIPASE, BLOOD  INFLUENZA PANEL BY PCR (TYPE A & B)    EKG EKG Interpretation  Date/Time:  Friday October 06 2018 09:20:22 EDT Ventricular Rate:  82 PR Interval:    QRS Duration: 87 QT Interval:  338 QTC Calculation: 395 R Axis:   77 Text Interpretation:  Sinus rhythm ST elev, probable normal early repol pattern No significant change was found Confirmed by Azalia Bilis ([id number]) on 10/06/2018 11:58:32 AM   Radiology No results found.  Procedures Procedures (including critical care time)  Medications Ordered in ED Medications  sodium chloride 0.9 % bolus 1,000 mL (0 mLs Intravenous Stopped 10/06/18 1355)  ondansetron (ZOFRAN) injection 4 mg  (4 mg Intravenous Given 10/06/18 1047)  HYDROmorphone (DILAUDID) injection 1 mg (1 mg Intravenous Given 10/06/18 1307)  HYDROmorphone (DILAUDID) injection 1 mg (1 mg Intravenous Given 10/06/18 1401)     Initial Impression / Assessment and Plan / ED Course  I have reviewed the triage vital signs and the nursing notes.  Pertinent labs &  imaging results that were available during my care of the patient were reviewed by me and considered in my medical decision making (see chart for details).       Symptoms controlled here in the emergency department.  Suspect viral nausea vomiting and diarrhea.  Overall well-appearing.  Pain improved in the emergency department with several doses of pain medication.  Afebrile.  Baseline hemoglobin.  Stable for discharge from the emergency department.   Final Clinical Impressions(s) / ED Diagnoses   Final diagnoses:  Gastroenteritis  Acute dehydration  Sickle cell pain crisis [facility])    ED Discharge Orders         Ordered    ondansetron (ZOFRAN ODT) 8 MG disintegrating tablet  Every 8 hours PRN     10/06/18 1429           Azalia Bilis, MD 10/10/18 [phone]

## 2018-11-10 NOTE — Telephone Encounter (Signed)
error 

## 2018-11-24 ENCOUNTER — Telehealth: Payer: Self-pay

## 2018-11-24 ENCOUNTER — Other Ambulatory Visit: Payer: Self-pay | Admitting: Family Medicine

## 2018-11-24 DIAGNOSIS — G894 Chronic pain syndrome: Secondary | ICD-10-CM

## 2018-11-24 DIAGNOSIS — D582 Other hemoglobinopathies: Secondary | ICD-10-CM

## 2018-11-24 MED ORDER — MORPHINE SULFATE 15 MG PO TABS: 15.0000 mg | ORAL_TABLET | ORAL | 0 refills | Status: DC | PRN

## 2018-11-27 NOTE — Telephone Encounter (Signed)
Patient states that it's time to renew his FMLA. Patient states that we need to contact there office and he will callback with the number

## 2018-11-27 NOTE — Telephone Encounter (Signed)
Patient notified

## 2018-11-29 NOTE — Telephone Encounter (Signed)
Paperwork has been received and put in [place] box to complete.

## 2018-11-29 NOTE — Telephone Encounter (Signed)
Spoke with Lelon Mast and she will be faxing over FMLA paperwork for patient . [phone] Ext. [phone] [phone] [phone]

## 2018-11-30 NOTE — Telephone Encounter (Signed)
Message sent to provider 

## 2018-12-04 ENCOUNTER — Ambulatory Visit: Payer: 59 | Admitting: Family Medicine

## 2018-12-06 ENCOUNTER — Other Ambulatory Visit: Payer: Self-pay

## 2018-12-06 ENCOUNTER — Encounter: Payer: Self-pay | Admitting: Family Medicine

## 2018-12-06 ENCOUNTER — Ambulatory Visit: Payer: 59 | Admitting: Family Medicine

## 2018-12-06 ENCOUNTER — Ambulatory Visit (INDEPENDENT_AMBULATORY_CARE_PROVIDER_SITE_OTHER): Payer: 59 | Admitting: Family Medicine

## 2018-12-06 VITALS — BP 126/88 | HR 84 | Temp 98.6°F | Ht 68.0 in | Wt 163.2 lb

## 2018-12-06 DIAGNOSIS — J302 Other seasonal allergic rhinitis: Secondary | ICD-10-CM | POA: Diagnosis not present

## 2018-12-06 DIAGNOSIS — Z09 Encounter for follow-up examination after completed treatment for conditions other than malignant neoplasm: Secondary | ICD-10-CM | POA: Diagnosis not present

## 2018-12-06 DIAGNOSIS — G894 Chronic pain syndrome: Secondary | ICD-10-CM | POA: Diagnosis not present

## 2018-12-06 DIAGNOSIS — F1190 Opioid use, unspecified, uncomplicated: Secondary | ICD-10-CM | POA: Diagnosis not present

## 2018-12-06 DIAGNOSIS — D582 Other hemoglobinopathies: Secondary | ICD-10-CM | POA: Diagnosis not present

## 2018-12-06 LAB — POCT URINALYSIS DIP (MANUAL ENTRY)
Bilirubin, UA: NEGATIVE
Blood, UA: NEGATIVE
Glucose, UA: NEGATIVE mg/dL
Ketones, POC UA: NEGATIVE mg/dL
Leukocytes, UA: NEGATIVE
Nitrite, UA: NEGATIVE
Protein Ur, POC: NEGATIVE mg/dL
Spec Grav, UA: 1.025 (ref 1.010–1.025)
Urobilinogen, UA: 1 E.U./dL
pH, UA: 6 (ref 5.0–8.0)

## 2018-12-06 NOTE — Progress Notes (Signed)
[facility]   Established Patient Office Visit  Subjective:  Patient ID: Austin Austin [name], male    DOB: July 18, 1991  Age: 28 y.o. MRN: [id number]  CC:  Chief Complaint  Patient presents with  . Follow-up    sickle cell    HPI Austin Austin [name] is a 28 year old male who presents for Hospital Follow Up today.   Past Medical History:  Diagnosis Date  . Acute maxillary sinusitis   . Eczema   . Sickle cell anemia (HCC)    Current Status: Since his last office visit, he has had 2 ED visits in 09/2018 for Gastroenteritis and Sinusitis. Today, ** is doing well with no complaints. He states that he has pain in his arms and legs. He rates his pain today at 5/10. He has not had a hospital visit for Sickle Cell Crisis since 07/25/2018 where he was treated and discharged the same day. He is currently taking all medications as prescribed and staying well hydrated. He reports occasional nausea, constipation, dizziness and headaches. He is currently on Intermittent FMLA, and has taken today off because of increased back pain. His anxiety it mild today, r/t family stressors. He denies suicidal ideations, homicidal ideations, or auditory hallucinations.  He denies fevers, chills, fatigue, recent infections, weight loss, and night sweats. He has not had any visual changes, and falls. No chest pain, Austin [name] palpitations, cough and shortness of breath reported. No reports of GI problems such as diarrhea, and constipation. He has no reports of blood in stools, dysuria and hematuria.   Past Surgical History:  Procedure Laterality Date  . WISDOM TOOTH EXTRACTION      Family History  Problem Relation Age of Onset  . Sickle cell trait Mother   . Diabetes Father   . Hypertension Father     Social History   Socioeconomic History  . Marital status: Married    Spouse name: Not on file  . Number of children: Not on file  . Years of education: Not on file   . Highest education level: Not on file  Occupational History  . Not on file  Social Needs  . Financial resource strain: Not on file  . Food insecurity:    Worry: Not on file    Inability: Not on file  . Transportation needs:    Medical: Not on file    Non-medical: Not on file  Tobacco Use  . Smoking status: Former Smoker    Packs/day: 0.10  . Smokeless tobacco: Never Used  Substance and Sexual Activity  . Alcohol use: Not Currently    Comment: occ  . Drug use: Yes    Types: Marijuana  . Sexual activity: Not on file  Lifestyle  . Physical activity:    Days per week: Not on file    Minutes per session: Not on file  . Stress: Not on file  Relationships  . Social connections:    Talks on phone: Not on file    Gets together: Not on file    Attends religious service: Not on file    Active member of club or organization: Not on file    Attends meetings of clubs or organizations: Not on file    Relationship status: Not on file  . Intimate partner violence:    Fear of current or ex partner: Not on file    Emotionally abused: Not on file    Physically abused: Not on file  Forced sexual activity: Not on file  Other Topics Concern  . Not on file  Social History Narrative  . Not on file    Outpatient Medications Prior to Visit  Medication Sig Dispense Refill  . acetaminophen (TYLENOL) 500 MG tablet Take 1 tablet (500 mg total) by mouth 2 (two) times daily. 60 tablet 3  . diphenhydrAMINE (BENADRYL) 25 MG tablet Take 25 mg by mouth every 6 (six) hours as needed for allergies.    . folic acid (FOLVITE) 1 MG tablet Take 1 tablet (1 mg total) by mouth daily. 30 tablet 11  . ibuprofen (ADVIL,MOTRIN) 800 MG tablet Take 1 tablet (800 mg total) by mouth every 8 (eight) hours as needed. 30 tablet 1  . loratadine (CLARITIN) 10 MG tablet Take 1 tablet (10 mg total) by mouth daily. 30 tablet 6  . morphine (MSIR) 15 MG tablet Take 1 tablet (15 mg total) by mouth every 4 (four) hours as  needed for up to 15 days for severe pain. 90 tablet 0  . ondansetron (ZOFRAN ODT) 8 MG disintegrating tablet Take 1 tablet (8 mg total) by mouth every 8 (eight) hours as needed for nausea or vomiting. 10 tablet 0  . ondansetron (ZOFRAN) 4 MG tablet Take 1 tablet (4 mg total) by mouth every 8 (eight) hours as needed for nausea or vomiting. 25 tablet 0  . gabapentin (NEURONTIN) 100 MG capsule Take 1 capsule (100 mg total) by mouth at bedtime. (Patient not taking: Reported on 12/06/2018) 90 capsule 3  . amoxicillin-clavulanate (AUGMENTIN) 875-125 MG tablet Take 1 tablet by mouth every 12 (twelve) hours. 14 tablet 0   No facility-administered medications prior to visit.     No Known Allergies  ROS Review of Systems  Constitutional: Negative.   HENT: Negative.   Eyes: Negative.   Respiratory: Negative.   Cardiovascular: Negative.   Gastrointestinal: Negative.   Endocrine: Negative.   Genitourinary: Negative.   Musculoskeletal: Positive for back pain.  Skin: Negative.   Neurological: Positive for dizziness and headaches.  Hematological: Negative.   Psychiatric/Behavioral: Negative.     Objective:    Physical Exam  Constitutional: He is oriented to person, place, and time. He appears well-developed and well-nourished.  HENT:  Head: Normocephalic and atraumatic.  Eyes: Conjunctivae are normal.  Neck: Normal range of motion. Neck supple.  Cardiovascular: Normal rate, regular rhythm, normal Austin [name] sounds and intact distal pulses.  Pulmonary/Chest: Effort normal and breath sounds normal.  Abdominal: Soft. Bowel sounds are normal.  Musculoskeletal:     Comments: Limited ROM in back  Neurological: He is alert and oriented to person, place, and time. He has normal reflexes.  Skin: Skin is warm and dry.  Psychiatric: He has a normal mood and affect. His behavior is normal. Judgment and thought content normal.  Nursing note and vitals reviewed.   BP 126/88 (BP Location: Left Arm, Patient  Position: Sitting, Cuff Size: Small)   Pulse 84   Temp 98.6 F (37 C) (Oral)   Ht 5\' 8"  (1.727 m)   Wt 163 lb 3.2 oz (74 kg)   SpO2 98%   BMI 24.81 kg/m  Wt Readings from Last 3 Encounters:  12/06/18 163 lb 3.2 oz (74 kg)  10/03/18 165 lb (74.8 kg)  09/30/18 180 lb (81.6 kg)    Health Maintenance Due  Topic Date Due  . TETANUS/TDAP  01/04/2009    There are no preventive care reminders to display for this patient.  Lab Results  Component  Value Date   TSH 0.662 08/12/2017   Lab Results  Component Value Date   WBC 13.0 (H) 10/06/2018   HGB 13.8 10/06/2018   HCT 40.3 10/06/2018   MCV 79.6 (L) 10/06/2018   PLT 119 (L) 10/06/2018   Lab Results  Component Value Date   NA 137 10/06/2018   K 3.8 10/06/2018   CO2 23 10/06/2018   GLUCOSE 115 (H) 10/06/2018   BUN 10 10/06/2018   CREATININE 0.74 10/06/2018   BILITOT 2.7 (H) 10/06/2018   ALKPHOS 65 10/06/2018   AST 12 (L) 10/06/2018   ALT 19 10/06/2018   PROT 9.6 (H) 10/06/2018   ALBUMIN 5.6 (H) 10/06/2018   CALCIUM 10.4 (H) 10/06/2018   ANIONGAP 12 10/06/2018   No results found for: CHOL No results found for: HDL No results found for: LDLCALC No results found for: TRIG No results found for: [facility] Lab Results  Component Value Date   HGBA1C 4.2 08/12/2017   Assessment & Plan:   1. Hospital discharge follow-up  2. Hemoglobin C-C disease (HCC) She is doing well today. She will continue to take pain medications as prescribed; will continue to avoid extreme heat and cold; will continue to eat a healthy diet and drink at least 64 ounces of water daily; continue stool softener as needed; will avoid colds and flu; will continue to get plenty of sleep and rest; will continue to avoid high stressful situations and remain infection free; will continue Folic Acid 1 mg daily to avoid sickle cell crisis.   3. Chronic pain syndrome  4. Chronic, continuous use of opioids  5. Seasonal allergies Stable.   6. Follow up He  will follow up in 2 months.  - POCT urinalysis dipstick  No orders of the defined types were placed in this encounter.   Orders Placed This Encounter  Procedures  . POCT urinalysis dipstick    Referral Orders  No referral(s) requested today    [name],  MSN, FNP-C [facility] [address] [place], [place] [address] [phone]     Problem List Items Addressed This Visit      Other   Hemoglobin C-C disease [facility])    Other Visit Diagnoses    Hospital discharge follow-up    -  Primary   Chronic pain syndrome       Chronic, continuous use of opioids       Seasonal allergies       Follow up       Relevant Orders   POCT urinalysis dipstick (Completed)      No orders of the defined types were placed in this encounter.   Follow-up: No follow-ups on file.    Kallie Locks, FNP

## 2018-12-22 ENCOUNTER — Telehealth: Payer: Self-pay | Admitting: Family Medicine

## 2018-12-22 NOTE — Telephone Encounter (Signed)
Paperwork has been faxed and patient is aware. 

## 2019-01-16 ENCOUNTER — Telehealth (HOSPITAL_COMMUNITY): Payer: Self-pay | Admitting: General Practice

## 2019-01-16 ENCOUNTER — Emergency Department (HOSPITAL_COMMUNITY)
Admission: EM | Admit: 2019-01-16 | Discharge: 2019-01-16 | Disposition: A | Discharge status: Home / Self Care | Payer: 59 | Attending: Emergency Medicine | Admitting: Emergency Medicine

## 2019-01-16 ENCOUNTER — Other Ambulatory Visit: Payer: Self-pay

## 2019-01-16 ENCOUNTER — Encounter (HOSPITAL_COMMUNITY): Payer: Self-pay

## 2019-01-16 DIAGNOSIS — R1010 Upper abdominal pain, unspecified: Secondary | ICD-10-CM | POA: Insufficient documentation

## 2019-01-16 DIAGNOSIS — Z5321 Procedure and treatment not carried out due to patient leaving prior to being seen by health care provider: Secondary | ICD-10-CM | POA: Diagnosis not present

## 2019-01-16 LAB — CBC
HCT: 36.9 % — ABNORMAL LOW (ref 39.0–52.0)
Hemoglobin: 13.1 g/dL (ref 13.0–17.0)
MCH: 28.4 pg (ref 26.0–34.0)
MCHC: 35.5 g/dL (ref 30.0–36.0)
MCV: 79.9 fL — ABNORMAL LOW (ref 80.0–100.0)
Platelets: 119 10*3/uL — ABNORMAL LOW (ref 150–400)
RBC: 4.62 MIL/uL (ref 4.22–5.81)
RDW: 14.1 % (ref 11.5–15.5)
WBC: 8.8 10*3/uL (ref 4.0–10.5)
nRBC: 0.6 % — ABNORMAL HIGH (ref 0.0–0.2)

## 2019-01-16 LAB — COMPREHENSIVE METABOLIC PANEL
ALT: 19 U/L (ref 0–44)
AST: 11 U/L — ABNORMAL LOW (ref 15–41)
Albumin: 4.8 g/dL (ref 3.5–5.0)
Alkaline Phosphatase: 55 U/L (ref 38–126)
Anion gap: 8 (ref 5–15)
BUN: 8 mg/dL (ref 6–20)
CO2: 28 mmol/L (ref 22–32)
Calcium: 9.6 mg/dL (ref 8.9–10.3)
Chloride: 100 mmol/L (ref 98–111)
Creatinine, Ser: 0.86 mg/dL (ref 0.61–1.24)
GFR calc Af Amer: >60 mL/min (ref >60)
GFR calc non Af Amer: >60 mL/min (ref >60)
Glucose, Bld: 95 mg/dL (ref 70–99)
Potassium: 4.5 mmol/L (ref 3.5–5.1)
Sodium: 136 mmol/L (ref 135–145)
Total Bilirubin: 1.4 mg/dL — ABNORMAL HIGH (ref 0.3–1.2)
Total Protein: 8.3 g/dL — ABNORMAL HIGH (ref 6.5–8.1)

## 2019-01-16 LAB — LIPASE, BLOOD: Lipase: 26 U/L (ref 11–51)

## 2019-01-16 MED ORDER — SODIUM CHLORIDE 0.9% FLUSH: 3.0000 mL | Freq: Once | INTRAVENOUS | Status: DC

## 2019-01-16 NOTE — Telephone Encounter (Signed)
Patient called, complained of middle abdominal pain rated at 10/10. Denied chest pain, fever, diarrhea, nausea/vomitting and priapism. Screened negative for Covid-19 symptoms. Admitted to having means of transportation without driving self after treatment. Last took 15 mg of morphine at about 14:00 yesterday. Per provider, in view of the abdominal pain patient told to go to the emergency room for further work up. Patient notified, verbalized understanding.

## 2019-01-16 NOTE — ED Triage Notes (Signed)
Patient c/o sickle cell crisis.   C/o mid/upper abdominal pain that started around 10:45 am today. 10/10 sharp Denies N/V and diarrhea.    Patient called sickle cell clinic and was told to come to ED for scan of abdominal pain.    Patient states usually his sickle cell crisis pain is in his back and has never had abdominal pain like this from his crisis.    A/Ox4 Ambulatory in triage.

## 2019-01-16 NOTE — ED Notes (Signed)
Patient left without being seen. Patient refused to stay to be seen by provider.

## 2019-01-19 ENCOUNTER — Ambulatory Visit (INDEPENDENT_AMBULATORY_CARE_PROVIDER_SITE_OTHER): Payer: 59 | Admitting: Family Medicine

## 2019-01-19 ENCOUNTER — Other Ambulatory Visit: Payer: Self-pay

## 2019-01-19 VITALS — BP 118/76 | HR 67 | Temp 97.6°F | Resp 14 | Ht 68.0 in | Wt 166.0 lb

## 2019-01-19 DIAGNOSIS — G894 Chronic pain syndrome: Secondary | ICD-10-CM | POA: Diagnosis not present

## 2019-01-19 DIAGNOSIS — F1190 Opioid use, unspecified, uncomplicated: Secondary | ICD-10-CM | POA: Diagnosis not present

## 2019-01-19 DIAGNOSIS — Z09 Encounter for follow-up examination after completed treatment for conditions other than malignant neoplasm: Secondary | ICD-10-CM

## 2019-01-19 DIAGNOSIS — D582 Other hemoglobinopathies: Secondary | ICD-10-CM

## 2019-01-19 DIAGNOSIS — Z79899 Other long term (current) drug therapy: Secondary | ICD-10-CM

## 2019-01-19 DIAGNOSIS — D571 Sickle-cell disease without crisis: Secondary | ICD-10-CM | POA: Diagnosis not present

## 2019-01-19 LAB — POCT URINALYSIS DIPSTICK
Bilirubin, UA: NEGATIVE
Blood, UA: NEGATIVE
Glucose, UA: NEGATIVE
Ketones, UA: NEGATIVE
Leukocytes, UA: NEGATIVE
Nitrite, UA: NEGATIVE
Protein, UA: NEGATIVE
Spec Grav, UA: 1.025 (ref 1.010–1.025)
Urobilinogen, UA: 1 E.U./dL
pH, UA: 6 (ref 5.0–8.0)

## 2019-01-19 MED ORDER — MORPHINE SULFATE 15 MG PO TABS: 15.0000 mg | ORAL_TABLET | ORAL | 0 refills | Status: AC | PRN

## 2019-01-19 NOTE — Progress Notes (Signed)
[facility]   Established Patient Office Visit  Subjective:  Patient ID: Austin [name], male    DOB: 1991/04/23  Age: 28 y.o. MRN: [id number]  CC:  Chief Complaint  Patient presents with  . Sickle Cell Anemia    increased crisis   . Medication Problem    morpine not controlling pain     HPI Austin MikeKeith D Tammen is a 28 year old male who presents for Follow Up today.   Past Medical History:  Diagnosis Date  . Acute maxillary sinusitis   . Eczema   . Sickle cell anemia (HCC)    Current Status: Since his last office visit, he is doing well with no complaints. He states that his has pain in his lower back today.  He rates his pain today at 8/10. He has not taken his pain medication as of yet today. He has not had a hospital visit for Sickle Cell Crisis since 07/25/2018 where he was treated and discharged the same day. He reports that he had episode of crisis with new abdominal pain, on 01/16/2019 and had to report to ED because he was not able [facility] be [facility]. He is concerned that his medication is not effective, because he has to take several pills at once when he experiences a pain crises. He currently does not take pain medications every today. He is currently taking all medications as prescribed and staying well hydrated. He reports occasional nausea, constipation, dizziness and headaches.   He denies fevers, chills, fatigue, recent infections, weight loss, and night sweats. He has not had any visual changes, and falls. No chest pain, heart palpitations, cough and shortness of breath reported. No reports of GI problems such as vomiting, and diarrhea. He has no reports of blood in stools, dysuria and hematuria. No depression or anxiety reported.   Past Surgical History:  Procedure Laterality Date  . WISDOM TOOTH EXTRACTION      Family History  Problem Relation Age of Onset  . Sickle cell trait Mother   . Diabetes  Father   . Hypertension Father     Social History   Socioeconomic History  . Marital status: Married    Spouse name: Not on file  . Number of children: Not on file  . Years of education: Not on file  . Highest education level: Not on file  Occupational History  . Not on file  Social Needs  . Financial resource strain: Not on file  . Food insecurity    Worry: Not on file    Inability: Not on file  . Transportation needs    Medical: Not on file    Non-medical: Not on file  Tobacco Use  . Smoking status: Former Smoker    Packs/day: 0.10  . Smokeless tobacco: Never Used  Substance and Sexual Activity  . Alcohol use: Not Currently    Comment: occ  . Drug use: Yes    Types: Marijuana  . Sexual activity: Not on file  Lifestyle  . Physical activity    Days per week: Not on file    Minutes per session: Not on file  . Stress: Not on file  Relationships  . Social Musicianconnections    Talks on phone: Not on file    Gets together: Not on file    Attends religious service: Not on file    Active member of club or organization: Not on file  Attends meetings of clubs or organizations: Not on file    Relationship status: Not on file  . Intimate partner violence    Fear of current or ex partner: Not on file    Emotionally abused: Not on file    Physically abused: Not on file    Forced sexual activity: Not on file  Other Topics Concern  . Not on file  Social History Narrative  . Not on file    Outpatient Medications Prior to Visit  Medication Sig Dispense Refill  . diphenhydrAMINE (BENADRYL) 25 MG tablet Take 25 mg by mouth every 6 (six) hours as needed for allergies.    . folic acid (FOLVITE) 1 MG tablet Take 1 tablet (1 mg total) by mouth daily. 30 tablet 11  . ibuprofen (ADVIL,MOTRIN) 800 MG tablet Take 1 tablet (800 mg total) by mouth every 8 (eight) hours as needed. 30 tablet 1  . loratadine (CLARITIN) 10 MG tablet Take 1 tablet (10 mg total) by mouth daily. 30 tablet 6  .  ondansetron (ZOFRAN ODT) 8 MG disintegrating tablet Take 1 tablet (8 mg total) by mouth every 8 (eight) hours as needed for nausea or vomiting. 10 tablet 0  . acetaminophen (TYLENOL) 500 MG tablet Take 1 tablet (500 mg total) by mouth 2 (two) times daily. (Patient not taking: Reported on 01/19/2019) 60 tablet 3  . gabapentin (NEURONTIN) 100 MG capsule Take 1 capsule (100 mg total) by mouth at bedtime. (Patient not taking: Reported on 12/06/2018) 90 capsule 3  . ondansetron (ZOFRAN) 4 MG tablet Take 1 tablet (4 mg total) by mouth every 8 (eight) hours as needed for nausea or vomiting. 25 tablet 0   No facility-administered medications prior to visit.     No Known Allergies  ROS Review of Systems  Constitutional: Negative.   HENT: Negative.   Eyes: Negative.   Respiratory: Negative.   Cardiovascular: Negative.   Gastrointestinal: Negative.   Endocrine: Negative.   Genitourinary: Negative.   Musculoskeletal: Positive for arthralgias (chronic joint pain r/t sickle cell anemia).  Skin: Negative.   Allergic/Immunologic: Negative.   Neurological: Negative.   Hematological: Negative.   Psychiatric/Behavioral: Negative.    Objective:    Physical Exam  Constitutional: He is oriented to person, place, and time. He appears well-developed and well-nourished.  HENT:  Head: Normocephalic and atraumatic.  Eyes: Conjunctivae are normal.  Neck: Normal range of motion. Neck supple.  Cardiovascular: Normal rate, regular rhythm, normal heart sounds and intact distal pulses.  Pulmonary/Chest: Effort normal and breath sounds normal.  Abdominal: Soft. Bowel sounds are normal.  Musculoskeletal: Normal range of motion.  Neurological: He is alert and oriented to person, place, and time. He has normal reflexes.  Skin: Skin is warm and dry.  Psychiatric: He has a normal mood and affect. His behavior is normal. Judgment and thought content normal.  Nursing note and vitals reviewed.   BP 118/76 (BP  Location: Left Arm, Patient Position: Sitting, Cuff Size: Normal)   Pulse 67   Temp 97.6 F (36.4 C) (Oral)   Resp 14   Ht 5\' 8"  (1.727 m)   Wt 166 lb (75.3 kg)   SpO2 100%   BMI 25.24 kg/m  Wt Readings from Last 3 Encounters:  01/19/19 166 lb (75.3 kg)  12/06/18 163 lb 3.2 oz (74 kg)  10/03/18 165 lb (74.8 kg)     There are no preventive care reminders to display for this patient.  There are no preventive care reminders to  display for this patient.  Lab Results  Component Value Date   TSH 0.662 08/12/2017   Lab Results  Component Value Date   WBC 8.8 01/16/2019   HGB 13.1 01/16/2019   HCT 36.9 (L) 01/16/2019   MCV 79.9 (L) 01/16/2019   PLT 119 (L) 01/16/2019   Lab Results  Component Value Date   NA 136 01/16/2019   K 4.5 01/16/2019   CO2 28 01/16/2019   GLUCOSE 95 01/16/2019   BUN 8 01/16/2019   CREATININE 0.86 01/16/2019   BILITOT 1.4 (H) 01/16/2019   ALKPHOS 55 01/16/2019   AST 11 (L) 01/16/2019   ALT 19 01/16/2019   PROT 8.3 (H) 01/16/2019   ALBUMIN 4.8 01/16/2019   CALCIUM 9.6 01/16/2019   ANIONGAP 8 01/16/2019   No results found for: CHOL No results found for: HDL No results found for: LDLCALC No results found for: TRIG No results found for: [facility] Lab Results  Component Value Date   HGBA1C 4.2 08/12/2017   Assessment & Plan:   1. Sickle cell anemia without crisis ([place])  2. Hemoglobin C-C disease ([place]) Lengthy discussion on possible admittance to [facility] today. Patient decided against and will take his pain medication daily as prescribed to prevent pain crisis. He will continue to take pain medications as prescribed; will continue to avoid extreme heat and cold; will continue to eat a healthy diet and drink at least 64 ounces of water daily; continue stool softener as needed; will avoid colds and flu; will continue to get plenty of sleep and rest; will continue to avoid high stressful situations and remain infection free; will continue Folic  Acid 1 mg daily to avoid sickle cell crisis.  - Urinalysis Dipstick - morphine (MSIR) 15 MG tablet; Take 1 tablet (15 mg total) by mouth every 4 (four) hours as needed for up to 15 days for severe pain.  Dispense: 90 tablet; Refill: 0  3. Chronic pain syndrome - morphine (MSIR) 15 MG tablet; Take 1 tablet (15 mg total) by mouth every 4 (four) hours as needed for up to 15 days for severe pain.  Dispense: 90 tablet; Refill: 0  4. Chronic, continuous use of opioids  5. Medication management Patient advised to take medication daily as prescribed to prevent pain crisis.   6. Follow up He will follow up in 2 months   Meds ordered this encounter  Medications  . morphine (MSIR) 15 MG tablet    Sig: Take 1 tablet (15 mg total) by mouth every 4 (four) hours as needed for up to 15 days for severe pain.    Dispense:  90 tablet    Refill:  0    Order Specific Question:   Supervising Provider    Answer:   Tresa Garter [id number]    Orders Placed This Encounter  Procedures  . Urinalysis Dipstick    Referral Orders  No referral(s) requested today    Kathe Becton,  MSN, FNP-BC [facility] [place], [place] [phone]    Problem List Items Addressed This Visit      Other   Hemoglobin C-C disease ([place])   Relevant Medications   morphine (MSIR) 15 MG tablet   Other Relevant Orders   Urinalysis Dipstick (Completed)    Other Visit Diagnoses    Sickle cell anemia without crisis ([place])    -  Primary   Chronic pain syndrome       Relevant  Medications   morphine (MSIR) 15 MG tablet   Chronic, continuous use of opioids       Medication management       Follow up          Meds ordered this encounter  Medications  . morphine (MSIR) 15 MG tablet    Sig: Take 1 tablet (15 mg total) by mouth every 4 (four) hours as needed for up to 15 days for severe pain.    Dispense:  90 tablet    Refill:  0    Order Specific  Question:   Supervising Provider    Answer:   Quentin AngstJEGEDE, OLUGBEMIGA E [id number]    Follow-up: Return in about 1 week (around 01/26/2019).    Kallie LocksNatalie M Sandhya Denherder, FNP

## 2019-01-22 NOTE — Progress Notes (Signed)
Sent to me in error. 

## 2019-01-23 ENCOUNTER — Emergency Department (HOSPITAL_COMMUNITY): Payer: 59

## 2019-01-23 ENCOUNTER — Encounter (HOSPITAL_COMMUNITY): Payer: Self-pay

## 2019-01-23 ENCOUNTER — Other Ambulatory Visit: Payer: Self-pay

## 2019-01-23 ENCOUNTER — Inpatient Hospital Stay (HOSPITAL_COMMUNITY)
Admission: EM | Admit: 2019-01-23 | Discharge: 2019-01-25 | DRG: 391 | Disposition: A | Discharge status: Home / Self Care | Payer: 59 | Attending: Internal Medicine | Admitting: Internal Medicine

## 2019-01-23 DIAGNOSIS — Z833 Family history of diabetes mellitus: Secondary | ICD-10-CM

## 2019-01-23 DIAGNOSIS — Z8249 Family history of ischemic heart disease and other diseases of the circulatory system: Secondary | ICD-10-CM

## 2019-01-23 DIAGNOSIS — D5700 Hb-SS disease with crisis, unspecified: Secondary | ICD-10-CM | POA: Diagnosis not present

## 2019-01-23 DIAGNOSIS — A084 Viral intestinal infection, unspecified: Secondary | ICD-10-CM | POA: Diagnosis not present

## 2019-01-23 DIAGNOSIS — E860 Dehydration: Secondary | ICD-10-CM | POA: Diagnosis not present

## 2019-01-23 DIAGNOSIS — Z832 Family history of diseases of the blood and blood-forming organs and certain disorders involving the immune mechanism: Secondary | ICD-10-CM | POA: Diagnosis not present

## 2019-01-23 DIAGNOSIS — D582 Other hemoglobinopathies: Secondary | ICD-10-CM | POA: Diagnosis not present

## 2019-01-23 DIAGNOSIS — R112 Nausea with vomiting, unspecified: Secondary | ICD-10-CM | POA: Diagnosis not present

## 2019-01-23 DIAGNOSIS — R0902 Hypoxemia: Secondary | ICD-10-CM | POA: Diagnosis not present

## 2019-01-23 DIAGNOSIS — M5489 Other dorsalgia: Secondary | ICD-10-CM | POA: Diagnosis not present

## 2019-01-23 DIAGNOSIS — G894 Chronic pain syndrome: Secondary | ICD-10-CM | POA: Diagnosis present

## 2019-01-23 DIAGNOSIS — R197 Diarrhea, unspecified: Secondary | ICD-10-CM | POA: Insufficient documentation

## 2019-01-23 DIAGNOSIS — R161 Splenomegaly, not elsewhere classified: Secondary | ICD-10-CM

## 2019-01-23 DIAGNOSIS — Z87891 Personal history of nicotine dependence: Secondary | ICD-10-CM | POA: Diagnosis not present

## 2019-01-23 DIAGNOSIS — R110 Nausea: Secondary | ICD-10-CM | POA: Diagnosis not present

## 2019-01-23 DIAGNOSIS — Z79899 Other long term (current) drug therapy: Secondary | ICD-10-CM

## 2019-01-23 DIAGNOSIS — Z79891 Long term (current) use of opiate analgesic: Secondary | ICD-10-CM

## 2019-01-23 DIAGNOSIS — B1920 Unspecified viral hepatitis C without hepatic coma: Secondary | ICD-10-CM | POA: Diagnosis not present

## 2019-01-23 DIAGNOSIS — Z791 Long term (current) use of non-steroidal anti-inflammatories (NSAID): Secondary | ICD-10-CM | POA: Diagnosis not present

## 2019-01-23 DIAGNOSIS — D638 Anemia in other chronic diseases classified elsewhere: Secondary | ICD-10-CM | POA: Diagnosis present

## 2019-01-23 DIAGNOSIS — Z20828 Contact with and (suspected) exposure to other viral communicable diseases: Secondary | ICD-10-CM | POA: Diagnosis present

## 2019-01-23 DIAGNOSIS — I10 Essential (primary) hypertension: Secondary | ICD-10-CM | POA: Diagnosis not present

## 2019-01-23 DIAGNOSIS — D696 Thrombocytopenia, unspecified: Secondary | ICD-10-CM | POA: Diagnosis present

## 2019-01-23 DIAGNOSIS — D57219 Sickle-cell/Hb-C disease with crisis, unspecified: Secondary | ICD-10-CM | POA: Diagnosis present

## 2019-01-23 DIAGNOSIS — R1013 Epigastric pain: Secondary | ICD-10-CM | POA: Diagnosis not present

## 2019-01-23 DIAGNOSIS — R1114 Bilious vomiting: Secondary | ICD-10-CM | POA: Diagnosis not present

## 2019-01-23 DIAGNOSIS — R1084 Generalized abdominal pain: Secondary | ICD-10-CM

## 2019-01-23 LAB — CBC WITH DIFFERENTIAL/PLATELET
Abs Immature Granulocytes: 0.08 10*3/uL — ABNORMAL HIGH (ref 0.00–0.07)
Basophils Absolute: 0 10*3/uL (ref 0.0–0.1)
Basophils Relative: 0 %
Eosinophils Absolute: 0.1 10*3/uL (ref 0.0–0.5)
Eosinophils Relative: 1 %
HCT: 40.5 % (ref 39.0–52.0)
Hemoglobin: 14.4 g/dL (ref 13.0–17.0)
Immature Granulocytes: 1 %
Lymphocytes Relative: 14 %
Lymphs Abs: 1.9 10*3/uL (ref 0.7–4.0)
MCH: 28.2 pg (ref 26.0–34.0)
MCHC: 35.6 g/dL (ref 30.0–36.0)
MCV: 79.4 fL — ABNORMAL LOW (ref 80.0–100.0)
Monocytes Absolute: 0.9 10*3/uL (ref 0.1–1.0)
Monocytes Relative: 6 %
Neutro Abs: 11.3 10*3/uL — ABNORMAL HIGH (ref 1.7–7.7)
Neutrophils Relative %: 78 %
Platelets: 125 10*3/uL — ABNORMAL LOW (ref 150–400)
RBC: 5.1 MIL/uL (ref 4.22–5.81)
RDW: 14.2 % (ref 11.5–15.5)
WBC: 14.3 10*3/uL — ABNORMAL HIGH (ref 4.0–10.5)
nRBC: 0.4 % — ABNORMAL HIGH (ref 0.0–0.2)

## 2019-01-23 LAB — LACTATE DEHYDROGENASE: LDH: 287 U/L — ABNORMAL HIGH (ref 98–192)

## 2019-01-23 LAB — COMPREHENSIVE METABOLIC PANEL
ALT: 20 U/L (ref 0–44)
AST: 10 U/L — ABNORMAL LOW (ref 15–41)
Albumin: 5.1 g/dL — ABNORMAL HIGH (ref 3.5–5.0)
Alkaline Phosphatase: 56 U/L (ref 38–126)
Anion gap: 8 (ref 5–15)
BUN: 8 mg/dL (ref 6–20)
CO2: 27 mmol/L (ref 22–32)
Calcium: 9.8 mg/dL (ref 8.9–10.3)
Chloride: 103 mmol/L (ref 98–111)
Creatinine, Ser: 0.87 mg/dL (ref 0.61–1.24)
GFR calc Af Amer: >60 mL/min (ref >60)
GFR calc non Af Amer: >60 mL/min (ref >60)
Glucose, Bld: 136 mg/dL — ABNORMAL HIGH (ref 70–99)
Potassium: 4.9 mmol/L (ref 3.5–5.1)
Sodium: 138 mmol/L (ref 135–145)
Total Bilirubin: 1.8 mg/dL — ABNORMAL HIGH (ref 0.3–1.2)
Total Protein: 9 g/dL — ABNORMAL HIGH (ref 6.5–8.1)

## 2019-01-23 LAB — SARS CORONAVIRUS 2 BY RT PCR (HOSPITAL ORDER, PERFORMED IN ~~LOC~~ HOSPITAL LAB): SARS Coronavirus 2: NEGATIVE

## 2019-01-23 LAB — LACTIC ACID, PLASMA
Lactic Acid, Venous: 1.1 mmol/L (ref 0.5–1.9)
Lactic Acid, Venous: 1.6 mmol/L (ref 0.5–1.9)

## 2019-01-23 LAB — RETICULOCYTES
Immature Retic Fract: 32 % — ABNORMAL HIGH (ref 2.3–15.9)
RBC.: 5.1 MIL/uL (ref 4.22–5.81)
Retic Count, Absolute: 218.8 10*3/uL — ABNORMAL HIGH (ref 19.0–186.0)
Retic Ct Pct: 4.3 % — ABNORMAL HIGH (ref 0.4–3.1)

## 2019-01-23 LAB — LIPASE, BLOOD: Lipase: 25 U/L (ref 11–51)

## 2019-01-23 LAB — FERRITIN: Ferritin: 387 ng/mL — ABNORMAL HIGH (ref 24–336)

## 2019-01-23 LAB — C-REACTIVE PROTEIN: CRP: <0.8 mg/dL (ref <1.0)

## 2019-01-23 LAB — D-DIMER, QUANTITATIVE: D-Dimer, Quant: 0.51 ug/mL-FEU — ABNORMAL HIGH (ref 0.00–0.50)

## 2019-01-23 MED ORDER — MORPHINE SULFATE 15 MG PO TABS: 15.0000 mg | ORAL_TABLET | ORAL | Status: DC | PRN

## 2019-01-23 MED ORDER — HYDROMORPHONE HCL 1 MG/ML IJ SOLN
1.0000 mg | Freq: Once | INTRAMUSCULAR | Status: AC
  Administered 2019-01-23: 1 mg via INTRAVENOUS
  Filled 2019-01-23: qty 1

## 2019-01-23 MED ORDER — ONDANSETRON HCL 4 MG PO TABS: 4.0000 mg | ORAL_TABLET | Freq: Four times a day (QID) | ORAL | Status: DC | PRN

## 2019-01-23 MED ORDER — SODIUM CHLORIDE 0.9 % IV BOLUS
1000.0000 mL | Freq: Once | INTRAVENOUS | Status: AC
  Administered 2019-01-23: 1000 mL via INTRAVENOUS

## 2019-01-23 MED ORDER — SODIUM CHLORIDE 0.9 % IV SOLN
INTRAVENOUS | Status: DC
  Administered 2019-01-23 – 2019-01-24 (×2): via INTRAVENOUS

## 2019-01-23 MED ORDER — PROMETHAZINE HCL 25 MG/ML IJ SOLN
25.0000 mg | Freq: Once | INTRAMUSCULAR | Status: DC
  Filled 2019-01-23: qty 1

## 2019-01-23 MED ORDER — ONDANSETRON HCL 4 MG/2ML IJ SOLN
4.0000 mg | Freq: Once | INTRAMUSCULAR | Status: AC
  Administered 2019-01-23: 4 mg via INTRAVENOUS
  Filled 2019-01-23: qty 2

## 2019-01-23 MED ORDER — GABAPENTIN 100 MG PO CAPS: 100.0000 mg | ORAL_CAPSULE | Freq: Every day | ORAL | Status: DC

## 2019-01-23 MED ORDER — HEPARIN SODIUM (PORCINE) 5000 UNIT/ML IJ SOLN
5000.0000 [IU] | Freq: Three times a day (TID) | INTRAMUSCULAR | Status: DC
  Administered 2019-01-23 – 2019-01-25 (×5): 5000 [IU] via SUBCUTANEOUS
  Filled 2019-01-23 (×5): qty 1

## 2019-01-23 MED ORDER — FOLIC ACID 1 MG PO TABS
1.0000 mg | ORAL_TABLET | Freq: Every day | ORAL | Status: DC
  Administered 2019-01-24 – 2019-01-25 (×2): 1 mg via ORAL
  Filled 2019-01-23 (×2): qty 1

## 2019-01-23 MED ORDER — PNEUMOCOCCAL VAC POLYVALENT 25 MCG/0.5ML IJ INJ
0.5000 mL | INJECTION | INTRAMUSCULAR | Status: DC
  Filled 2019-01-23: qty 0.5

## 2019-01-23 MED ORDER — ONDANSETRON HCL 4 MG/2ML IJ SOLN
4.0000 mg | Freq: Four times a day (QID) | INTRAMUSCULAR | Status: DC | PRN
  Administered 2019-01-23 – 2019-01-24 (×2): 4 mg via INTRAVENOUS
  Filled 2019-01-23 (×2): qty 2

## 2019-01-23 MED ORDER — SODIUM CHLORIDE (PF) 0.9 % IJ SOLN
INTRAMUSCULAR | Status: AC
  Filled 2019-01-23: qty 50

## 2019-01-23 MED ORDER — ACETAMINOPHEN 325 MG PO TABS: 650.0000 mg | ORAL_TABLET | Freq: Four times a day (QID) | ORAL | Status: DC | PRN

## 2019-01-23 MED ORDER — IOHEXOL 300 MG/ML  SOLN
100.0000 mL | Freq: Once | INTRAMUSCULAR | Status: AC | PRN
  Administered 2019-01-23: 100 mL via INTRAVENOUS

## 2019-01-23 MED ORDER — TRAZODONE HCL 50 MG PO TABS: 25.0000 mg | ORAL_TABLET | Freq: Every evening | ORAL | Status: DC | PRN

## 2019-01-23 MED ORDER — TRAMADOL HCL 50 MG PO TABS: 50.0000 mg | ORAL_TABLET | Freq: Four times a day (QID) | ORAL | Status: DC | PRN

## 2019-01-23 MED ORDER — ONDANSETRON 4 MG PO TBDP: 8.0000 mg | ORAL_TABLET | Freq: Three times a day (TID) | ORAL | Status: DC | PRN

## 2019-01-23 MED ORDER — HYDROMORPHONE HCL 1 MG/ML IJ SOLN
1.0000 mg | INTRAMUSCULAR | Status: DC | PRN
  Administered 2019-01-23 – 2019-01-24 (×7): 1 mg via INTRAVENOUS
  Filled 2019-01-23 (×7): qty 1

## 2019-01-23 MED ORDER — ACETAMINOPHEN 650 MG RE SUPP: 650.0000 mg | Freq: Four times a day (QID) | RECTAL | Status: DC | PRN

## 2019-01-23 MED ORDER — DIPHENHYDRAMINE HCL 25 MG PO CAPS: 25.0000 mg | ORAL_CAPSULE | Freq: Four times a day (QID) | ORAL | Status: DC | PRN

## 2019-01-23 MED ORDER — LORATADINE 10 MG PO TABS
10.0000 mg | ORAL_TABLET | Freq: Every day | ORAL | Status: DC
  Filled 2019-01-23: qty 1

## 2019-01-23 NOTE — ED Triage Notes (Addendum)
EMS reports from home developing N/V this morning, diaphoretic and back pain exacerbating sickle cell crisis. Pt states he did not sleep all night.  BP 140/100 HR 80 RR 16 Sp02 97 RA   20 LAC  4mg  Zofran enroute

## 2019-01-23 NOTE — H&P (Addendum)
History and Physical    Austin [name] PPI:[id number] DOB: 05-Feb-1991 DOA: 01/23/2019  PCP: Azzie Glatter, FNP  Patient coming from: home  I have personally briefly reviewed patient's old medical records in [place]  Chief Complaint: nausea vomitting diarrhea  HPI: Austin [name] is a 28 y.o. male with medical history significant for sickle cell who is employed at [employer].  Patient presented to the ER at [facility] long reporting nausea vomiting and diarrhea onset around 7 AM this morning.  He did report some back pain associated with this but reported that this is not like his sickle cell crises.  Patient denies any other joint pain any chest pain or other symptoms actively gets with a sickle cell crisis.  He denies any sick contacts around family any recent travel, or any other COVID exposures.  He reported the nausea vomiting and diarrhea is severe and began at 7:00 this morning and is associated with abd pain.  Nothing is made better other than treatment in the ER as outlined below   ED Course: White blood cell count of 14.3 hemoglobin 14.4 CMP with normal LFTs, alk phos, total bili is mildly elevated at 1.8, CT abdomen pelvis is negative for any acute findings with chronic known splenomegaly,  in the ER patient received 2 L fluids, pain meds, antiemetics and was somewhat sedated when I saw him from antiemetics,   A COVID test was not done, due to GI sx and possible workplace exposure, I requested ER to do COVID test prior to admission and that the patient is not to be transferred out of ER until the test is resulted negative.  Pt was seen using PPE face shield, mask, gown and gloves  Review of Systems: As per HPI otherwise 10 point review of systems done notable for nausea vomiting and diarrhea all other reviewed and are negative Past Medical History:  Diagnosis Date  . Acute maxillary sinusitis   . Eczema   . Sickle cell anemia (HCC)     Past Surgical  History:  Procedure Laterality Date  . WISDOM TOOTH EXTRACTION       reports that he has quit smoking. He smoked 0.10 packs per day. He has never used smokeless tobacco. He reports previous alcohol use. He reports current drug use. Drug: Marijuana.  No Known Allergies  Family History  Problem Relation Age of Onset  . Sickle cell trait Mother   . Diabetes Father   . Hypertension Father      Prior to Admission medications   Medication Sig Start Date End Date Taking? Authorizing Provider  acetaminophen (TYLENOL) 500 MG tablet Take 1 tablet (500 mg total) by mouth 2 (two) times daily. Patient not taking: Reported on 01/19/2019 09/04/18   Azzie Glatter, FNP  diphenhydrAMINE (BENADRYL) 25 MG tablet Take 25 mg by mouth every 6 (six) hours as needed for allergies.    [provider]  folic acid (FOLVITE) 1 MG tablet Take 1 tablet (1 mg total) by mouth daily. 04/17/18   Azzie Glatter, FNP  gabapentin (NEURONTIN) 100 MG capsule Take 1 capsule (100 mg total) by mouth at bedtime. Patient not taking: Reported on 12/06/2018 08/01/18   Azzie Glatter, FNP  ibuprofen (ADVIL,MOTRIN) 800 MG tablet Take 1 tablet (800 mg total) by mouth every 8 (eight) hours as needed. 05/10/18   Azzie Glatter, FNP  loratadine (CLARITIN) 10 MG tablet Take 1 tablet (10 mg total) by mouth daily. 10/03/18  Azzie Glatter, FNP  morphine (MSIR) 15 MG tablet Take 1 tablet (15 mg total) by mouth every 4 (four) hours as needed for up to 15 days for severe pain. 01/19/19 02/03/19  Azzie Glatter, FNP  ondansetron (ZOFRAN ODT) 8 MG disintegrating tablet Take 1 tablet (8 mg total) by mouth every 8 (eight) hours as needed for nausea or vomiting. 10/06/18   Jola Schmidt, MD    Physical Exam: Vitals:   01/23/19 1530 01/23/19 1600 01/23/19 1630 01/23/19 1725  BP: 127/79 127/76 (!) 140/91 (!) 131/95  Pulse: 87 78 70 86  Resp:  _0 Temp:      SpO2: 100% 100% 100% 98%    Constitutional: NAD, calm,  comfortable Vitals:   01/23/19 1530 01/23/19 1600 01/23/19 1630 01/23/19 1725  BP: 127/79 127/76 (!) 140/91 (!) 131/95  Pulse: 87 78 70 86  Resp:  _1 Temp:      SpO2: 100% 100% 100% 98%   Eyes: PERRL, lids and conjunctivae normal ENMT: Mucous membranes are mildly dry. Posterior pharynx clear of any exudate or lesions.Normal dentition.  Neck: normal, supple, no masses, no thyromegaly Respiratory: clear to auscultation bilaterally, no wheezing, no crackles. Normal respiratory effort. No accessory muscle use.  Cardiovascular: Regular rate and rhythm, no murmurs / rubs / gallops. No extremity edema. 2+ pedal pulses. No carotid bruits.  Abdomen: Mildly tender to palpation distant bowel sounds no rebound no guarding not rigid no focal findings Musculoskeletal: no clubbing / cyanosis. No joint deformity upper and lower extremities. Good ROM, no contractures. Normal muscle tone.  Skin: Mild tenting no obvious lesions Neurologic: CN 2-12 grossly intact. Sensation intact, DTR normal. Strength 5/5 in all 4.  Psychiatric: Normal judgment and insight. Alert and oriented x 3 sedated from antiemetics. Normal mood.     Labs on Admission: I have personally reviewed following labs and imaging studies  CBC: Recent Labs  Lab 01/23/19 1420  WBC 14.3*  NEUTROABS 11.3*  HGB 14.4  HCT 40.5  MCV 79.4*  PLT 086*   Basic Metabolic Panel: Recent Labs  Lab 01/23/19 1420  NA 138  K 4.9  CL 103  CO2 27  GLUCOSE 136*  BUN 8  CREATININE 0.87  CALCIUM 9.8   GFR: Estimated Creatinine Clearance: 121.2 mL/min (by C-G formula based on SCr of 0.87 mg/dL). Liver Function Tests: Recent Labs  Lab 01/23/19 1420  AST 10*  ALT 20  ALKPHOS 56  BILITOT 1.8*  PROT 9.0*  ALBUMIN 5.1*   Recent Labs  Lab 01/23/19 1420  LIPASE 25   No results for input(s): AMMONIA in the last 168 hours. Coagulation Profile: No results for input(s): INR, PROTIME in the last 168 hours. Cardiac Enzymes: No  results for input(s): CKTOTAL, CKMB, CKMBINDEX, TROPONINI in the last 168 hours. BNP (last 3 results) No results for input(s): PROBNP in the last 8760 hours. HbA1C: No results for input(s): HGBA1C in the last 72 hours. CBG: No results for input(s): GLUCAP in the last 168 hours. Lipid Profile: No results for input(s): CHOL, HDL, LDLCALC, TRIG, CHOLHDL, LDLDIRECT in the last 72 hours. Thyroid Function Tests: No results for input(s): TSH, T4TOTAL, FREET4, T3FREE, THYROIDAB in the last 72 hours. Anemia Panel: Recent Labs    01/23/19 1420  RETICCTPCT 4.3*   Urine analysis:    Component Value Date/Time   COLORURINE STRAW (A) 07/25/2018 1230   APPEARANCEUR CLEAR 07/25/2018 1230   LABSPEC 1.003 (L) 07/25/2018 1230   PHURINE  7.0 07/25/2018 1230   GLUCOSEU NEGATIVE 07/25/2018 1230   HGBUR NEGATIVE 07/25/2018 1230   BILIRUBINUR neg 01/19/2019 1052   KETONESUR negative 12/06/2018 0842   KETONESUR NEGATIVE 07/25/2018 1230   PROTEINUR Negative 01/19/2019 1052   PROTEINUR NEGATIVE 07/25/2018 1230   UROBILINOGEN 1.0 01/19/2019 1052   UROBILINOGEN 1.0 10/18/2017 0841   NITRITE negative 01/19/2019 1052   NITRITE NEGATIVE 07/25/2018 1230   LEUKOCYTESUR Negative 01/19/2019 1052    Radiological Exams on Admission: Ct Abdomen Pelvis W Contrast  Result Date: 01/23/2019 CLINICAL DATA:  Abdominal pain, nausea, vomiting, sickle cell crisis EXAM: CT ABDOMEN AND PELVIS WITH CONTRAST TECHNIQUE: Multidetector CT imaging of the abdomen and pelvis was performed using the standard protocol following bolus administration of intravenous contrast. CONTRAST:  124m OMNIPAQUE IOHEXOL 300 MG/ML  SOLN COMPARISON:  12/15/2017 FINDINGS: Lower chest: No acute abnormality. Hepatobiliary: No solid liver abnormality is seen. No gallstones, gallbladder wall thickening, or biliary dilatation. Pancreas: Unremarkable. No pancreatic ductal dilatation or surrounding inflammatory changes. Spleen: Splenomegaly, maximum coronal  span 17.5 cm. Adrenals/Urinary Tract: Adrenal glands are unremarkable. Kidneys are normal, without renal calculi, solid lesion, or hydronephrosis. Bladder is unremarkable. Stomach/Bowel: Stomach is within normal limits. Appendix appears normal. No evidence of bowel wall thickening, distention, or inflammatory changes. Vascular/Lymphatic: No significant vascular findings are present. No enlarged abdominal or pelvic lymph nodes. Reproductive: No mass or other significant abnormality. Other: No abdominal wall hernia or abnormality. No abdominopelvic ascites. Musculoskeletal: No acute or significant osseous findings. IMPRESSION: 1.  No acute CT findings of the abdomen or pelvis to explain pain. 2. Splenomegaly, not significantly changed compared to prior examination. Electronically Signed   By: AEddie CandleM.D.   On: 01/23/2019 16:29   UK[place]Abdomen Limited Ruq  Result Date: 01/23/2019 CLINICAL DATA:  Bilious emesis.  Vomiting.  Hepatitis C. EXAM: ULTRASOUND ABDOMEN LIMITED RIGHT UPPER QUADRANT COMPARISON:  None. FINDINGS: Gallbladder: Gallbladder is poorly distended limiting evaluation with no obvious abnormalities. No stones, wall thickening, pericholecystic fluid, or Murphy's sign. Common bile duct: Not visualized. Liver: No focal lesion identified. Within normal limits in parenchymal echogenicity. Portal vein is patent on color Doppler imaging with normal direction of blood flow towards the liver. IMPRESSION: 1. The study is limited due to patient condition and inability to lie still. The common bile duct is not visualized. The gallbladder and liver are normal. Electronically Signed   By: DDorise BullionIII M.D   On: 01/23/2019 16:26    EKG: Independently reviewed.  EKG showing LVH no acute ST changes  Assessment/Plan Active Problems:   Viral gastroenteritis   Sickle cell disease    Dehydration  Probable viral gastroenteritis: CT was negative, will check C. difficile and GI pathogen.  Patient is a [employer] exposure risk is higher than normal.  I have asked the ER to perform a COVID test which is pending and rule out prior to admission.  We will check ferritin, LDH, CRP, d-dimer, and as mentioned COVID 19, continue with aggressive fluid resuscitation, antiemetics, KUB as clinically indicated.  Sickle cell disease.  Patient reports this is not like his sickle cell pain crisis.  Although he has back pain he reports he has no other joint pain.  We will continue aggressive hydration follow-up laboratory work-up to include reticulocytes LDH and haptoglobin, make pain meds available as needed,    Dehydration, aggressive fluid resuscitation as above  Discussed COVID test pending status with floor manager as well as sickle  cell disease.  They indicated patient will remain in the ER until COVID testing is back if negative then he will be admitted to the sickle cell team.   DVT prophylaxis: Heparin SQ  Code Status: Full code Code Status History    Date Active Date Inactive Code Status Order ID Comments User Context   07/25/2018 1129 07/25/2018 2008 Full Code [id number]  Dorena Dew, FNP Inpatient   05/18/2018 0901 05/18/2018 1831 Full Code [id number]  Dorena Dew, FNP Inpatient   03/07/2018 1200 03/07/2018 1848 Full Code [id number]  Tresa Garter, MD Inpatient   03/07/2018 1159 03/07/2018 1200 Full Code [id number]  Tresa Garter, MD Inpatient   12/15/2017 1345 12/16/2017 1521 Full Code [id number]  Elwyn Reach, MD Inpatient   Advance Care Planning Activity     Family Communication: None present Disposition Plan:   Patient admitted under observation for aggressive fluid resuscitation, antiemetics, electrolyte monitoring and follow-up labs.  The patient stabilized overnight anticipate to be able to be discharged in 1 day. Consults called: None Admission status: Observation   Nicolette Bang MD [facility]   If 7PM-7AM, please contact  night-coverage   01/23/2019, 5:57 PM

## 2019-01-23 NOTE — ED Notes (Signed)
ED TO INPATIENT HANDOFF REPORT  ED Nurse Name and Phone #: Hilaria OtaKatie  S Name/Age/Gender Austin EdwardKeith D Rosado 28 y.o. male Room/Bed: WA11/WA11  Code Status   Code Status: Prior  Home/SNF/Other Home Patient oriented to: self, place, time and situation Is this baseline? Yes   Triage Complete: Triage complete  Chief Complaint n v d sickle cell  Triage Note EMS reports from home developing N/V this morning, diaphoretic and back pain exacerbating sickle cell crisis. Pt states he did not sleep all night.  BP 140/100 HR 80 RR 16 Sp02 97 RA   20 LAC  4mg  Zofran enroute   Allergies No Known Allergies  Level of Care/Admitting Diagnosis ED Disposition    ED Disposition Condition Comment   Admit  [facility] [[id number]]  Level of Care: Med-Surg [16]  Covid Evaluation: Confirmed COVID Negative  Diagnosis: Viral gastroenteritis [[id number]][[id number]]  Admitting Physician: Marzetta BoardSPONGBERG, CHRISTOPHER N [[id number]]  Attending Physician: Marzetta BoardSPONGBERG, CHRISTOPHER N [[id number]]  PT Class (Do Not Modify): Observation [104]  PT Acc Code (Do Not Modify): Observation [10022]       B Medical/Surgery History Past Medical History:  Diagnosis Date  . Acute maxillary sinusitis   . Eczema   . Sickle cell anemia (HCC)    Past Surgical History:  Procedure Laterality Date  . WISDOM TOOTH EXTRACTION       A IV Location/Drains/Wounds Patient Lines/Drains/Airways Status   Active Line/Drains/Airways    Name:   Placement date:   Placement time:   Site:   Days:   Peripheral IV 01/23/19 Anterior;Distal;Left;Upper Arm   01/23/19    1317    Arm   less than 1          Intake/Output Last 24 hours  Intake/Output Summary (Last 24 hours) at 01/23/2019 2015 Last data filed at 01/23/2019 1932 Gross per 24 hour  Intake 2000 ml  Output 450 ml  Net 1550 ml    Labs/Imaging Results for orders placed or performed during the hospital encounter of 01/23/19 (from the past 48 hour(s))  CBC  with Differential     Status: Abnormal   Collection Time: 01/23/19  2:20 PM  Result Value Ref Range   WBC 14.3 (H) 4.0 - 10.5 K/uL   RBC 5.10 4.22 - 5.81 MIL/uL   Hemoglobin 14.4 13.0 - 17.0 g/dL   HCT 78.240.5 95.639.0 - 21.352.0 %   MCV 79.4 (L) 80.0 - 100.0 fL   MCH 28.2 26.0 - 34.0 pg   MCHC 35.6 30.0 - 36.0 g/dL   RDW 08.614.2 57.811.5 - 46.915.5 %   Platelets 125 (L) 150 - 400 K/uL   nRBC 0.4 (H) 0.0 - 0.2 %   Neutrophils Relative % 78 %   Neutro Abs 11.3 (H) 1.7 - 7.7 K/uL   Lymphocytes Relative 14 %   Lymphs Abs 1.9 0.7 - 4.0 K/uL   Monocytes Relative 6 %   Monocytes Absolute 0.9 0.1 - 1.0 K/uL   Eosinophils Relative 1 %   Eosinophils Absolute 0.1 0.0 - 0.5 K/uL   Basophils Relative 0 %   Basophils Absolute 0.0 0.0 - 0.1 K/uL   Immature Granulocytes 1 %   Abs Immature Granulocytes 0.08 (H) 0.00 - 0.07 K/uL    Comment: Performed at [facility], [address] [place], [place] [address]  Comprehensive metabolic panel     Status: Abnormal   Collection Time: 01/23/19  2:20 PM  Result Value Ref Range   Sodium 138 135 -  145 mmol/L   Potassium 4.9 3.5 - 5.1 mmol/L   Chloride 103 98 - 111 mmol/L   CO2 27 22 - 32 mmol/L   Glucose, Bld 136 (H) 70 - 99 mg/dL   BUN 8 6 - 20 mg/dL   Creatinine, Ser 1.610.87 0.61 - 1.24 mg/dL   Calcium 9.8 8.9 - 09.610.3 mg/dL   Total Protein 9.0 (H) 6.5 - 8.1 g/dL   Albumin 5.1 (H) 3.5 - 5.0 g/dL   AST 10 (L) 15 - 41 U/L   ALT 20 0 - 44 U/L   Alkaline Phosphatase 56 38 - 126 U/L   Total Bilirubin 1.8 (H) 0.3 - 1.2 mg/dL   GFR calc non Af Amer >60 >60 mL/min   GFR calc Af Amer >60 >60 mL/min   Anion gap 8 5 - 15    Comment: Performed at [facility], [address] [place], [place] [address]  Lipase, blood     Status: None   Collection Time: 01/23/19  2:20 PM  Result Value Ref Range   Lipase 25 11 - 51 U/L    Comment: Performed at [facility], [address] [place], [place] [address]  Reticulocytes      Status: Abnormal   Collection Time: 01/23/19  2:20 PM  Result Value Ref Range   Retic Ct Pct 4.3 (H) 0.4 - 3.1 %   RBC. 5.10 4.22 - 5.81 MIL/uL   Retic Count, Absolute 218.8 (H) 19.0 - 186.0 K/uL   Immature Retic Fract 32.0 (H) 2.3 - 15.9 %    Comment: Performed at [facility], [address] [place], [place] [address]  Lactic acid, plasma     Status: None   Collection Time: 01/23/19  5:25 PM  Result Value Ref Range   Lactic Acid, Venous 1.1 0.5 - 1.9 mmol/L    Comment: Performed at [facility], [address] [place], [place] [address]  SARS Coronavirus 2 (CEPHEID - Performed in [facility] hospital lab), Hosp Order     Status: None   Collection Time: 01/23/19  5:51 PM   Specimen: Nasopharyngeal Swab  Result Value Ref Range   SARS Coronavirus 2 NEGATIVE NEGATIVE    Comment: (NOTE) If result is NEGATIVE SARS-CoV-2 target nucleic acids are NOT DETECTED. The SARS-CoV-2 RNA is generally detectable in upper and lower  respiratory specimens during the acute phase of infection. The lowest  concentration of SARS-CoV-2 viral copies this assay can detect is 250  copies / mL. A negative result does not preclude SARS-CoV-2 infection  and should not be used as the sole basis for treatment or other  patient management decisions.  A negative result may occur with  improper specimen collection / handling, submission of specimen other  than nasopharyngeal swab, presence of viral mutation(s) within the  areas targeted by this assay, and inadequate number of viral copies  (<250 copies / mL). A negative result must be combined with clinical  observations, patient history, and epidemiological information. If result is POSITIVE SARS-CoV-2 target nucleic acids are DETECTED. The SARS-CoV-2 RNA is generally detectable in upper and lower  respiratory specimens dur ing the acute phase of infection.  Positive  results are indicative of active infection with  SARS-CoV-2.  Clinical  correlation with patient history and other diagnostic information is  necessary to determine patient infection status.  Positive results do  not rule out bacterial infection or co-infection with other viruses. If result is PRESUMPTIVE  POSTIVE SARS-CoV-2 nucleic acids MAY BE PRESENT.   A presumptive positive result was obtained on the submitted specimen  and confirmed on repeat testing.  While 2019 novel coronavirus  (SARS-CoV-2) nucleic acids may be present in the submitted sample  additional confirmatory testing may be necessary for epidemiological  and / or clinical management purposes  to differentiate between  SARS-CoV-2 and other Sarbecovirus currently known to infect humans.  If clinically indicated additional testing with an alternate test  methodology [phone]([id number]) is advised. The SARS-CoV-2 RNA is generally  detectable in upper and lower respiratory sp ecimens during the acute  phase of infection. The expected result is Negative. Fact Sheet for Patients:  BoilerBrush.com.cyhttps://www.fda.gov/media/136312/download Fact Sheet for Healthcare Providers: https://pope.com/https://www.fda.gov/media/136313/download This test is not yet approved or cleared by the [place] FDA and has been authorized for detection and/or diagnosis of SARS-CoV-2 by FDA under an Emergency Use Authorization (EUA).  This EUA will remain in effect (meaning this test can be used) for the duration of the COVID-19 declaration under Section 564(b)(1) of the Act, 21 U.S.C. section 360bbb-3(b)(1), unless the authorization is terminated or revoked sooner. Performed at [facility], [address] [place], [place] [address]    Ct Abdomen Pelvis W Contrast  Result Date: 01/23/2019 CLINICAL DATA:  Abdominal pain, nausea, vomiting, sickle cell crisis EXAM: CT ABDOMEN AND PELVIS WITH CONTRAST TECHNIQUE: Multidetector CT imaging of the abdomen and pelvis was performed using the standard protocol following  bolus administration of intravenous contrast. CONTRAST:  100mL OMNIPAQUE IOHEXOL 300 MG/ML  SOLN COMPARISON:  12/15/2017 FINDINGS: Lower chest: No acute abnormality. Hepatobiliary: No solid liver abnormality is seen. No gallstones, gallbladder wall thickening, or biliary dilatation. Pancreas: Unremarkable. No pancreatic ductal dilatation or surrounding inflammatory changes. Spleen: Splenomegaly, maximum coronal span 17.5 cm. Adrenals/Urinary Tract: Adrenal glands are unremarkable. Kidneys are normal, without renal calculi, solid lesion, or hydronephrosis. Bladder is unremarkable. Stomach/Bowel: Stomach is within normal limits. Appendix appears normal. No evidence of bowel wall thickening, distention, or inflammatory changes. Vascular/Lymphatic: No significant vascular findings are present. No enlarged abdominal or pelvic lymph nodes. Reproductive: No mass or other significant abnormality. Other: No abdominal wall hernia or abnormality. No abdominopelvic ascites. Musculoskeletal: No acute or significant osseous findings. IMPRESSION: 1.  No acute CT findings of the abdomen or pelvis to explain pain. 2. Splenomegaly, not significantly changed compared to prior examination. Electronically Signed   By: Lauralyn PrimesAlex  Bibbey M.D.   On: 01/23/2019 16:29   [place] Abdomen Limited Ruq  Result Date: 01/23/2019 CLINICAL DATA:  Bilious emesis.  Vomiting.  Hepatitis C. EXAM: ULTRASOUND ABDOMEN LIMITED RIGHT UPPER QUADRANT COMPARISON:  None. FINDINGS: Gallbladder: Gallbladder is poorly distended limiting evaluation with no obvious abnormalities. No stones, wall thickening, pericholecystic fluid, or Murphy's sign. Common bile duct: Not visualized. Liver: No focal lesion identified. Within normal limits in parenchymal echogenicity. Portal vein is patent on color Doppler imaging with normal direction of blood flow towards the liver. IMPRESSION: 1. The study is limited due to patient condition and inability to lie still. The common bile duct  is not visualized. The gallbladder and liver are normal. Electronically Signed   By: Gerome Samavid  Williams III M.D   On: 01/23/2019 16:26    Pending Labs Unresulted Labs (From admission, onward)    Start     Ordered   01/23/19 1805  C difficile quick scan w PCR reflex  (C Difficile quick screen w PCR reflex panel)  Once, for 24 hours,   STAT  01/23/19 1813   01/23/19 1805  Gastrointestinal Panel by PCR , Stool  (Gastrointestinal Panel by PCR, Stool)  Once,   STAT     01/23/19 1813   01/23/19 1754  Haptoglobin  Once,   STAT     01/23/19 1753   01/23/19 1647  Lactate dehydrogenase  ONCE - STAT,   STAT     01/23/19 1646   01/23/19 1646  Lactic acid, plasma  Now then every 2 hours,   STAT     01/23/19 1646   01/23/19 1513  Rapid urine drug screen (hospital performed)  Add-on,   AD     01/23/19 1512   01/23/19 1334  Urinalysis, Routine w reflex microscopic  Once,   STAT     01/23/19 1334   01/23/19 1334  Urine culture  Add-on,   AD     01/23/19 1334   Signed and Held  HIV antibody (Routine Testing)  Once,   R     Signed and Held   Signed and Held  CBC  (heparin)  Once,   R    Comments: Baseline for heparin therapy IF NOT ALREADY DRAWN.  Notify MD if PLT < 100 K.    Signed and Held   Signed and Held  Creatinine, serum  (heparin)  Once,   R    Comments: Baseline for heparin therapy IF NOT ALREADY DRAWN.    Signed and Held   Signed and Held  Comprehensive metabolic panel  Tomorrow morning,   R     Signed and Held   Signed and Held  CBC  Tomorrow morning,   R     Signed and Held   Signed and Held  C-reactive protein  Once,   R     Signed and Held   Signed and Held  Lactate dehydrogenase  Once,   R     Signed and Held   Signed and Held  Ferritin  Once,   R     Signed and Held   Signed and Held  D-dimer, quantitative (not at [facility])  Once,   R     Signed and Held          Vitals/Pain Today's Vitals   01/23/19 1926 01/23/19 1930 01/23/19 1932 01/23/19 1933  BP:  113/74    Pulse:   90    Resp:  17    Temp:      SpO2: 100% 100%    PainSc:   10-Worst pain ever 10-Worst pain ever    Isolation Precautions Enteric precautions (UV disinfection)  Medications Medications  sodium chloride (PF) 0.9 % injection (has no administration in time range)  promethazine (PHENERGAN) injection 25 mg (25 mg Intravenous Refused 01/23/19 1751)  HYDROmorphone (DILAUDID) injection 1 mg (has no administration in time range)  sodium chloride 0.9 % bolus 1,000 mL (0 mLs Intravenous Stopped 01/23/19 1737)  iohexol (OMNIPAQUE) 300 MG/ML solution 100 mL (100 mLs Intravenous Contrast Given 01/23/19 1505)  sodium chloride 0.9 % bolus 1,000 mL (0 mLs Intravenous Stopped 01/23/19 1625)  ondansetron (ZOFRAN) injection 4 mg (4 mg Intravenous Given 01/23/19 1627)  HYDROmorphone (DILAUDID) injection 1 mg (1 mg Intravenous Given 01/23/19 1725)    Mobility walks     Focused Assessments NA   R Recommendations: See Admitting Provider Note  Report given to:   Additional Notes: NA

## 2019-01-23 NOTE — ED Provider Notes (Signed)
[facility]-EMERGENCY DEPT Provider Note   CSN: [id number] Arrival date & time: 01/23/19  1305  History   Chief Complaint Chief Complaint  Patient presents with  . Sickle Cell Pain Crisis  . Back Pain  . Nausea   HPI Austin [name] is a 28 y.o. male with past medical history significant for sickle cell anemia, eczema who presents for evaluation of nausea vomiting.  Patient states he has persistent nausea and vomiting which began yesterday.  Patient has had multiple episodes of nonbloody emesis.  Emesis is bilious.  With generalized abdominal pain.  He has no focal abdominal pain.  His current pain a 6/10.  Denies radiation of pain.  Patient also with 2 episodes of nonbloody diarrhea.  She has had over 12 episodes of emesis since last night.  He has not been taking his home medication of morphine which he takes for his sickle cell pain secondary to this.  Denies prior abdominal surgeries.  Denies fever, chills, chest pain, shortness of breath, back pain, extremity pain, numbness or tingling.  Not take anything for his emesis PTA. Tried Phenrgan at home prior to EMS arrival.  History obtained from patient and past medical records.  No interpreter was used.     HPI  Past Medical History:  Diagnosis Date  . Acute maxillary sinusitis   . Eczema   . Sickle cell anemia [facility](HCC)     Patient Active Problem List   Diagnosis Date Noted  . Viral gastroenteritis 01/23/2019  . Sickle cell pain crisis (HCC) 05/18/2018  . Acute sickle cell crisis (HCC) 12/15/2017  . Thrombocytopenia (HCC) 12/15/2017  . Splenomegaly 12/15/2017  . Sickle cell anemia with crisis (HCC) 12/15/2017  . 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 Spleen enlarged 08/18/2017    Past Surgical History:  Procedure Laterality Date  . WISDOM TOOTH EXTRACTION          Home Medications    Prior to Admission medications   Medication Sig Start Date End Date Taking? Authorizing Provider   acetaminophen (TYLENOL) 500 MG tablet Take 1 tablet (500 mg total) by mouth 2 (two) times daily. Patient not taking: Reported on 01/19/2019 09/04/18   Kallie LocksStroud, Natalie M, FNP  diphenhydrAMINE (BENADRYL) 25 MG tablet Take 25 mg by mouth every 6 (six) hours as needed for allergies.    [provider]  folic acid (FOLVITE) 1 MG tablet Take 1 tablet (1 mg total) by mouth daily. 04/17/18   Kallie LocksStroud, Natalie M, FNP  gabapentin (NEURONTIN) 100 MG capsule Take 1 capsule (100 mg total) by mouth at bedtime. Patient not taking: Reported on 12/06/2018 08/01/18   Kallie LocksStroud, Natalie M, FNP  ibuprofen (ADVIL,MOTRIN) 800 MG tablet Take 1 tablet (800 mg total) by mouth every 8 (eight) hours as needed. 05/10/18   Kallie LocksStroud, Natalie M, FNP  loratadine (CLARITIN) 10 MG tablet Take 1 tablet (10 mg total) by mouth daily. 10/03/18   Kallie LocksStroud, Natalie M, FNP  morphine (MSIR) 15 MG tablet Take 1 tablet (15 mg total) by mouth every 4 (four) hours as needed for up to 15 days for severe pain. 01/19/19 02/03/19  Kallie LocksStroud, Natalie M, FNP  ondansetron (ZOFRAN ODT) 8 MG disintegrating tablet Take 1 tablet (8 mg total) by mouth every 8 (eight) hours as needed for nausea or vomiting. 10/06/18   Azalia Bilisampos, Kevin, MD    Family History Family History  Problem Relation Age of Onset  . Sickle cell trait Mother   . Diabetes Father   .  Hypertension Father     Social History Social History   Tobacco Use  . Smoking status: Former Smoker    Packs/day: 0.10  . Smokeless tobacco: Never Used  Substance Use Topics  . Alcohol use: Not Currently    Comment: occ  . Drug use: Yes    Types: Marijuana    Allergies   Patient has no known allergies.   Review of Systems Review of Systems  Constitutional: Negative.   HENT: Negative.   Respiratory: Negative.   Cardiovascular: Negative.   Gastrointestinal: Positive for abdominal pain, nausea and vomiting. Negative for abdominal distention, anal bleeding, blood in stool, constipation, diarrhea and  rectal pain.  All other systems reviewed and are negative.  Physical Exam Updated Vital Signs BP (!) 131/95   Pulse 86   Temp 97.8 F (36.6 C)   Resp 16   SpO2 98%   Physical Exam Vitals signs and nursing note reviewed.  Constitutional:      General: He is not in acute distress.    Appearance: He is not ill-appearing, toxic-appearing or diaphoretic.  HENT:     Head: Normocephalic and atraumatic.     Jaw: There is normal jaw occlusion.     Nose: Nose normal.     Comments: Clear rhinorrhea and congestion to bilateral nares.  No sinus tenderness.    Mouth/Throat:     Mouth: Mucous membranes are moist.     Pharynx: Oropharynx is clear.     Comments: Posterior oropharynx clear.  Mucous membranes moist.  Tonsils without erythema or exudate.  Uvula midline without deviation.  No evidence of PTA or RPA.  No drooling, dysphasia or trismus.  Phonation normal. Neck:     Musculoskeletal: Full passive range of motion without pain and normal range of motion.     Trachea: Trachea and phonation normal.     Comments: No Neck stiffness or neck rigidity.  No meningismus.  No cervical lymphadenopathy. Cardiovascular:     Pulses: Normal pulses.          Dorsalis pedis pulses are 2+ on the right side and 2+ on the left side.       Posterior tibial pulses are 2+ on the right side and 2+ on the left side.     Heart sounds: Normal heart sounds.     Comments: No murmurs rubs or gallops. Pulmonary:     Effort: Pulmonary effort is normal.     Breath sounds: Normal breath sounds and air entry.     Comments: Clear to auscultation bilaterally without wheeze, rhonchi or rales.  No accessory muscle usage.  Able speak in full sentences. Abdominal:     General: Bowel sounds are normal.     Palpations: Abdomen is soft. There is no pulsatile mass.     Tenderness: There is generalized abdominal tenderness. There is no right CVA tenderness, left CVA tenderness, guarding or rebound. Negative signs include  Murphy's sign and Rovsing's sign.     Hernia: No hernia is present.     Comments: Soft, nontender without rebound or guarding.  No CVA tenderness.  Musculoskeletal:     Lumbar back: Normal.     Comments: Moves all 4 extremities without difficulty.  Lower extremities without edema, erythema or warmth.  Skin:    Comments: Brisk capillary refill.  No rashes or lesions.  Neurological:     General: No focal deficit present.     Mental Status: He is alert.     Cranial Nerves: Cranial  nerves are intact.     Sensory: Sensation is intact.     Motor: Motor function is intact.     Coordination: Coordination is intact.     Gait: Gait is intact.     Comments: Ambulatory in department without difficulty.  Cranial nerves II through XII grossly intact.  No facial droop.  No aphasia.      ED Treatments / Results  Labs (all labs ordered are listed, but only abnormal results are displayed) Labs Reviewed  CBC WITH DIFFERENTIAL/PLATELET - Abnormal; Notable for the following components:      Result Value   WBC 14.3 (*)    MCV 79.4 (*)    Platelets 125 (*)    nRBC 0.4 (*)    Neutro Abs 11.3 (*)    Abs Immature Granulocytes 0.08 (*)    All other components within normal limits  COMPREHENSIVE METABOLIC PANEL - Abnormal; Notable for the following components:   Glucose, Bld 136 (*)    Total Protein 9.0 (*)    Albumin 5.1 (*)    AST 10 (*)    Total Bilirubin 1.8 (*)    All other components within normal limits  RETICULOCYTES - Abnormal; Notable for the following components:   Retic Ct Pct 4.3 (*)    Retic Count, Absolute 218.8 (*)    Immature Retic Fract 32.0 (*)    All other components within normal limits  URINE CULTURE  SARS CORONAVIRUS 2 (HOSPITAL ORDER, PERFORMED IN [facility])  LIPASE, BLOOD  LACTIC ACID, PLASMA  URINALYSIS, ROUTINE W REFLEX MICROSCOPIC  RAPID URINE DRUG SCREEN, HOSP PERFORMED  LACTIC ACID, PLASMA  LACTATE DEHYDROGENASE  HAPTOGLOBIN    EKG    Radiology Ct Abdomen Pelvis W Contrast  Result Date: 01/23/2019 CLINICAL DATA:  Abdominal pain, nausea, vomiting, sickle cell crisis EXAM: CT ABDOMEN AND PELVIS WITH CONTRAST TECHNIQUE: Multidetector CT imaging of the abdomen and pelvis was performed using the standard protocol following bolus administration of intravenous contrast. CONTRAST:  187mL OMNIPAQUE IOHEXOL 300 MG/ML  SOLN COMPARISON:  12/15/2017 FINDINGS: Lower chest: No acute abnormality. Hepatobiliary: No solid liver abnormality is seen. No gallstones, gallbladder wall thickening, or biliary dilatation. Pancreas: Unremarkable. No pancreatic ductal dilatation or surrounding inflammatory changes. Spleen: Splenomegaly, maximum coronal span 17.5 cm. Adrenals/Urinary Tract: Adrenal glands are unremarkable. Kidneys are normal, without renal calculi, solid lesion, or hydronephrosis. Bladder is unremarkable. Stomach/Bowel: Stomach is within normal limits. Appendix appears normal. No evidence of bowel wall thickening, distention, or inflammatory changes. Vascular/Lymphatic: No significant vascular findings are present. No enlarged abdominal or pelvic lymph nodes. Reproductive: No mass or other significant abnormality. Other: No abdominal wall hernia or abnormality. No abdominopelvic ascites. Musculoskeletal: No acute or significant osseous findings. IMPRESSION: 1.  No acute CT findings of the abdomen or pelvis to explain pain. 2. Splenomegaly, not significantly changed compared to prior examination. Electronically Signed   By: Eddie Candle M.D.   On: 01/23/2019 16:29   US Abdomen Limited Ruq  Result Date: 01/23/2019 CLINICAL DATA:  Bilious emesis.  Vomiting.  Hepatitis C. EXAM: ULTRASOUND ABDOMEN LIMITED RIGHT UPPER QUADRANT COMPARISON:  None. FINDINGS: Gallbladder: Gallbladder is poorly distended limiting evaluation with no obvious abnormalities. No stones, wall thickening, pericholecystic fluid, or Murphy's sign. Common bile duct: Not visualized.  Liver: No focal lesion identified. Within normal limits in parenchymal echogenicity. Portal vein is patent on color Doppler imaging with normal direction of blood flow towards the liver. IMPRESSION: 1. The study is limited due to patient  condition and inability to lie still. The common bile duct is not visualized. The gallbladder and liver are normal. Electronically Signed   By: Gerome Samavid  Williams III M.D   On: 01/23/2019 16:26    Procedures Procedures (including critical care time)  Medications Ordered in ED Medications  sodium chloride (PF) 0.9 % injection (has no administration in time range)  promethazine (PHENERGAN) injection 25 mg (25 mg Intravenous Refused 01/23/19 1751)  HYDROmorphone (DILAUDID) injection 1 mg (has no administration in time range)  sodium chloride 0.9 % bolus 1,000 mL (0 mLs Intravenous Stopped 01/23/19 1737)  iohexol (OMNIPAQUE) 300 MG/ML solution 100 mL (100 mLs Intravenous Contrast Given 01/23/19 1505)  sodium chloride 0.9 % bolus 1,000 mL (0 mLs Intravenous Stopped 01/23/19 1625)  ondansetron (ZOFRAN) injection 4 mg (4 mg Intravenous Given 01/23/19 1627)  HYDROmorphone (DILAUDID) injection 1 mg (1 mg Intravenous Given 01/23/19 1725)   Initial Impression / Assessment and Plan / ED Course  I have reviewed the triage vital signs and the nursing notes.  Pertinent labs & imaging results that were available during my care of the patient were reviewed by me and considered in my medical decision making (see chart for details).  28 year old male appears otherwise well presents for evaluation of emesis.  Afebrile, nonseptic appearing.  Patient with greater than 12 episodes of bilious emesis prior to arrival.  Patient arrived via EMS and had ready had 4 mg IV Zofran and home phenergan.  Patient with continual episodes of bilious emesis in the emergency department.  He has generalized abdominal pain.  No rebound or guarding.  No focal pain.  No prior abdominal surgeries.  Denies any  upper respiratory symptoms.  Patient did have 2 episodes of nonbloody diarrhea yesterday evening however none today.  Denies any chest pain or shortness of breath.  On arrival patient diaphoretic and somnolent.  He is arousable to voice and painful stimuli.  He is unable to give me a full description of his pain other than stating that he felt "extremely weak."  Will obtain labs, imaging and reevaluate.  Labs and imaging personally reviewed: CBC with mild leukocytosis at 14.3, elevated reticulocytes, metabolic panel with mild hyperbilirubinemia 1.8, similar to previous labs. Lactic acid 1.1, low suspicion for mesenteric ischemia.  CT abdomen pelvis without acute AP findings, ultrasound right upper quadrant negative for cholecystitis.  EKG without ST/T changes.  No STEMI.  Low suspicion for PE, dissection, [place], atypical ACS, acute chest syndrome, obstruction/bowel perforation, appendicitis, cholecystitis, perforated ulcer, volvulus, bacterial infection.  Patient is had 2 additional doses of Zofran in emergency department.  Continues to have nonbloody emesis.  Continues to complain of generalized abdominal pain without imaging to support a cause of his pain.  Question gastroenteritis.  Will order Phenergan.  Patient is received 2 L of IV fluids.  Will need to be admitted to the hospital for intractable nausea vomiting. Patient with greater than 15 episodes of emesis in the emergency department.  Denies THC use to suggest cannabinoid hyperemesis.  Patient states he typically has sickle cell pain to bilateral legs and arms.  He denies any of this current pain.  I have low suspicion for sickle cell crisis.  Does fall quickly back asleep after arousal and speaking with him.  Low suspicion for acute intracranial pathology.  UDS is pending.  Consulted with Dr. Lurene ShadowSpongberg who agrees to evaluate patient in the ED for admission for intractable N/V.      Patient has been seen and evaluated by  my attending  physician Dr. Jeraldine Loots who agrees with the treatment, plan and disposition. Final Clinical Impressions(s) / ED Diagnoses   Final diagnoses:  Bilious emesis  Nausea vomiting and diarrhea  Generalized abdominal pain    ED Discharge Orders    None       Yesennia Hirota A, PA-C 01/23/19 1807    Gerhard Munch, MD 01/24/19 2336

## 2019-01-23 NOTE — ED Notes (Signed)
Bed: DE08 Expected date:  Expected time:  Means of arrival:  Comments: [facility]

## 2019-01-24 DIAGNOSIS — Z20828 Contact with and (suspected) exposure to other viral communicable diseases: Secondary | ICD-10-CM | POA: Diagnosis present

## 2019-01-24 DIAGNOSIS — R1114 Bilious vomiting: Secondary | ICD-10-CM

## 2019-01-24 DIAGNOSIS — D582 Other hemoglobinopathies: Secondary | ICD-10-CM | POA: Diagnosis not present

## 2019-01-24 DIAGNOSIS — R197 Diarrhea, unspecified: Secondary | ICD-10-CM | POA: Diagnosis not present

## 2019-01-24 DIAGNOSIS — R1084 Generalized abdominal pain: Secondary | ICD-10-CM | POA: Diagnosis not present

## 2019-01-24 DIAGNOSIS — Z79891 Long term (current) use of opiate analgesic: Secondary | ICD-10-CM | POA: Diagnosis not present

## 2019-01-24 DIAGNOSIS — Z791 Long term (current) use of non-steroidal anti-inflammatories (NSAID): Secondary | ICD-10-CM | POA: Diagnosis not present

## 2019-01-24 DIAGNOSIS — G894 Chronic pain syndrome: Secondary | ICD-10-CM | POA: Diagnosis present

## 2019-01-24 DIAGNOSIS — E860 Dehydration: Secondary | ICD-10-CM | POA: Diagnosis present

## 2019-01-24 DIAGNOSIS — D696 Thrombocytopenia, unspecified: Secondary | ICD-10-CM | POA: Diagnosis present

## 2019-01-24 DIAGNOSIS — Z8249 Family history of ischemic heart disease and other diseases of the circulatory system: Secondary | ICD-10-CM | POA: Diagnosis not present

## 2019-01-24 DIAGNOSIS — Z79899 Other long term (current) drug therapy: Secondary | ICD-10-CM | POA: Diagnosis not present

## 2019-01-24 DIAGNOSIS — Z833 Family history of diabetes mellitus: Secondary | ICD-10-CM | POA: Diagnosis not present

## 2019-01-24 DIAGNOSIS — R161 Splenomegaly, not elsewhere classified: Secondary | ICD-10-CM | POA: Diagnosis not present

## 2019-01-24 DIAGNOSIS — Z832 Family history of diseases of the blood and blood-forming organs and certain disorders involving the immune mechanism: Secondary | ICD-10-CM | POA: Diagnosis not present

## 2019-01-24 DIAGNOSIS — D57219 Sickle-cell/Hb-C disease with crisis, unspecified: Secondary | ICD-10-CM | POA: Diagnosis present

## 2019-01-24 DIAGNOSIS — D5700 Hb-SS disease with crisis, unspecified: Secondary | ICD-10-CM | POA: Diagnosis not present

## 2019-01-24 DIAGNOSIS — D638 Anemia in other chronic diseases classified elsewhere: Secondary | ICD-10-CM | POA: Diagnosis present

## 2019-01-24 DIAGNOSIS — A084 Viral intestinal infection, unspecified: Secondary | ICD-10-CM | POA: Diagnosis not present

## 2019-01-24 DIAGNOSIS — Z87891 Personal history of nicotine dependence: Secondary | ICD-10-CM | POA: Diagnosis not present

## 2019-01-24 DIAGNOSIS — R112 Nausea with vomiting, unspecified: Secondary | ICD-10-CM | POA: Diagnosis not present

## 2019-01-24 LAB — RAPID URINE DRUG SCREEN, HOSP PERFORMED
Amphetamines: NOT DETECTED
Barbiturates: NOT DETECTED
Benzodiazepines: POSITIVE — AB
Cocaine: NOT DETECTED
Opiates: POSITIVE — AB
Tetrahydrocannabinol: POSITIVE — AB

## 2019-01-24 LAB — URINALYSIS, ROUTINE W REFLEX MICROSCOPIC
Bilirubin Urine: NEGATIVE
Glucose, UA: NEGATIVE mg/dL
Hgb urine dipstick: NEGATIVE
Ketones, ur: 5 mg/dL — AB
Leukocytes,Ua: NEGATIVE
Nitrite: NEGATIVE
Protein, ur: NEGATIVE mg/dL
Specific Gravity, Urine: 1.024 (ref 1.005–1.030)
pH: 5 (ref 5.0–8.0)

## 2019-01-24 LAB — COMPREHENSIVE METABOLIC PANEL
ALT: 17 U/L (ref 0–44)
AST: 9 U/L — ABNORMAL LOW (ref 15–41)
Albumin: 4.2 g/dL (ref 3.5–5.0)
Alkaline Phosphatase: 46 U/L (ref 38–126)
Anion gap: 8 (ref 5–15)
BUN: 6 mg/dL (ref 6–20)
CO2: 25 mmol/L (ref 22–32)
Calcium: 9 mg/dL (ref 8.9–10.3)
Chloride: 106 mmol/L (ref 98–111)
Creatinine, Ser: 0.81 mg/dL (ref 0.61–1.24)
GFR calc Af Amer: >60 mL/min (ref >60)
GFR calc non Af Amer: >60 mL/min (ref >60)
Glucose, Bld: 89 mg/dL (ref 70–99)
Potassium: 3.6 mmol/L (ref 3.5–5.1)
Sodium: 139 mmol/L (ref 135–145)
Total Bilirubin: 1.8 mg/dL — ABNORMAL HIGH (ref 0.3–1.2)
Total Protein: 6.8 g/dL (ref 6.5–8.1)

## 2019-01-24 LAB — CBC
HCT: 33.5 % — ABNORMAL LOW (ref 39.0–52.0)
Hemoglobin: 11.9 g/dL — ABNORMAL LOW (ref 13.0–17.0)
MCH: 28.1 pg (ref 26.0–34.0)
MCHC: 35.5 g/dL (ref 30.0–36.0)
MCV: 79.2 fL — ABNORMAL LOW (ref 80.0–100.0)
Platelets: 94 10*3/uL — ABNORMAL LOW (ref 150–400)
RBC: 4.23 MIL/uL (ref 4.22–5.81)
RDW: 14 % (ref 11.5–15.5)
WBC: 11.8 10*3/uL — ABNORMAL HIGH (ref 4.0–10.5)
nRBC: 0.3 % — ABNORMAL HIGH (ref 0.0–0.2)

## 2019-01-24 LAB — HIV ANTIBODY (ROUTINE TESTING W REFLEX): HIV Screen 4th Generation wRfx: NONREACTIVE

## 2019-01-24 MED ORDER — SODIUM CHLORIDE 0.45 % IV SOLN
INTRAVENOUS | Status: DC
  Administered 2019-01-24 – 2019-01-25 (×2): via INTRAVENOUS

## 2019-01-24 MED ORDER — NALOXONE HCL 0.4 MG/ML IJ SOLN: 0.4000 mg | INTRAMUSCULAR | Status: DC | PRN

## 2019-01-24 MED ORDER — PROMETHAZINE HCL 25 MG/ML IJ SOLN
12.5000 mg | Freq: Four times a day (QID) | INTRAMUSCULAR | Status: DC | PRN
  Administered 2019-01-24: 12.5 mg via INTRAVENOUS
  Filled 2019-01-24: qty 1

## 2019-01-24 MED ORDER — DIPHENHYDRAMINE HCL 25 MG PO CAPS: 25.0000 mg | ORAL_CAPSULE | ORAL | Status: DC | PRN

## 2019-01-24 MED ORDER — SODIUM CHLORIDE 0.9% FLUSH: 9.0000 mL | INTRAVENOUS | Status: DC | PRN

## 2019-01-24 MED ORDER — HYDROMORPHONE 1 MG/ML IV SOLN
INTRAVENOUS | Status: DC
  Administered 2019-01-24: 1.1 mg via INTRAVENOUS
  Administered 2019-01-24: 30 mg via INTRAVENOUS
  Administered 2019-01-24: 0.6 mg via INTRAVENOUS
  Administered 2019-01-25: 1.2 mg via INTRAVENOUS
  Administered 2019-01-25: 0 mg via INTRAVENOUS
  Administered 2019-01-25: 0.3 mg via INTRAVENOUS
  Filled 2019-01-24: qty 30

## 2019-01-24 MED ORDER — ONDANSETRON HCL 4 MG/2ML IJ SOLN: 4.0000 mg | Freq: Four times a day (QID) | INTRAMUSCULAR | Status: DC | PRN

## 2019-01-24 MED ORDER — SODIUM CHLORIDE 0.9 % IV SOLN
25.0000 mg | INTRAVENOUS | Status: DC | PRN
  Filled 2019-01-24: qty 0.5

## 2019-01-24 NOTE — Progress Notes (Signed)
  Austin [name], a 28 year old male with a medical history significant for sickle cell disease, chronic pain syndrome, history of anemia of chronic disease presented to ER on 01/23/2019 with complaints of nausea, vomiting, and diarrhea.  Patient admitted for probable viral gastroenteritis in the presence of sickle cell pain crisis.  He reports that diarrhea is resolved.  However, nausea and vomiting persists.  Nausea has been uncontrolled by IV Zofran.  Patient is complaining of pain primarily to abdomen.  Pain intensity is 8/10.  Patient transferred to sickle cell team.  Patient has been evaluated by try at hospitalist today.  Assessment/plan: Continue current management plan with the following changes:   Viral gastroenteritis: Reviewed CT of abdomen, no acute findings.  Diarrhea has subsided, will discontinue contact precautions. COVID-19 test negative Patient had 2 episodes of vomiting and endorses nausea.  Add promethazine 12.5 mg IV every 4 hours as needed. Discontinue soft diet, clear liquid diet for the next 24 hours.  Will reassess in a.m. Fluid resuscitation, 0.45% saline at 100 mL/h  Sickle cell pain crisis: Discontinue Dilaudid 1 mg per clinician assisted doses. Initiate high concentration Dilaudid PCA per weight-based protocol.  Settings of 0.5 mg, 10-minute lockout, and 2 mg/h. Continue MS IR 15 mg every 4 hours as needed for moderate to severe breakthrough pain. Maintain oxygen saturation above 90%, 2 L supplemental oxygen as needed No signs of acute chest syndrome Repeat CBC and CMP in a.m.  Sickle cell anemia: Hemoglobin consistent with patient's baseline, no indication for blood transfusion at this time.  Follow CBC  Leukocytosis: WBCs 14.3.  Suspected viral gastroenteritis.  Patient afebrile.  Repeat CBC in a.m.  Thrombocytopenia: More than likely related to IV hydration.  Start 0.45% saline at 100 mL/h.     Austin Pounds  APRN, MSN, FNP-C [facility] [address] [place], [place] [address] [phone]

## 2019-01-24 NOTE — Progress Notes (Signed)
PROGRESS NOTE    Austin [name]  HER:[id number] DOB: 30-Jul-1990 DOA: 01/23/2019 PCP: Azzie Glatter, FNP   Brief Narrative:  Austin [name] is Austin [name] 28 y.o. male with medical history significant for sickle cell who is employed at [employer].  Patient presented to the ER at [facility] long reporting nausea vomiting and diarrhea onset around 7 AM this morning.  He did report some back pain associated with this but reported that this is not like his sickle cell crises.  Patient denies any other joint pain any chest pain or other symptoms actively gets with Tam Savoia sickle cell crisis.  He denies any sick contacts around family any recent travel, or any other COVID exposures.  He reported the nausea vomiting and diarrhea is severe and began at 7:00 this morning and is associated with abd pain.  Nothing is made better other than treatment in the ER as outlined below   ED Course: White blood cell count of 14.3 hemoglobin 14.4 CMP with normal LFTs, alk phos, total bili is mildly elevated at 1.8, CT abdomen pelvis is negative for any acute findings with chronic known splenomegaly,  in the ER patient received 2 L fluids, pain meds, antiemetics and was somewhat sedated when I saw him from antiemetics,   Talon Regala COVID test was not done, due to GI sx and possible workplace exposure, I requested ER to do COVID test prior to admission and that the patient is not to be transferred out of ER until the test is resulted negative.   Assessment & Plan:   Active Problems:   Viral gastroenteritis   Bilious vomiting without nausea  Pt to be transferred to sickle cell team  Probable viral gastroenteritis: CT was negative, will check C. difficile and GI pathogen (not collected yet).   Negative COVID 19 testing Elevated LDH, ferritin, normal CRP and lactic acid D dimer is mildly elevated, but this was collected with concern for COVID and not VTE.  It's only mildly elevated.  No CP or SOB.  No LE edema or pain.   Low suspicion for VTE.  Discussed with pt and rationale for holding off on additional imaging at this point.  Discussed return precautions. N/V/D seem improved, advanced diet to soft, but pt had vomiting with lunch.  Unable to d/c at this point.  Continue IVF, antiemetics.  Sickle cell disease.  Patient reported pain crisis last night that seems resolved. Pending haptoglobin Adequate retic Elevated LDH Continue current pain regimen   Dehydration, aggressive fluid resuscitation as above  Anemia  thrombocytopenia: suspect related to IVF hydration as all counts dropped  Discussed COVID test pending status with floor manager as well as sickle cell disease.  They indicated patient will remain in the ER until COVID testing is back if negative then he will be admitted to the sickle cell team.  DVT prophylaxis: heparin Code Status: full  Family Communication: none at bedside Disposition Plan: hopefully d/c within 24-48 hours, but requires continued admission with N/V and inability to tolerate PO, needs continued admission with IV abx and antiemetics   Consultants:   none  Procedures:   none  Antimicrobials:  Anti-infectives (From admission, onward)   None         Subjective: Denies CP or SOB Denies LE swelling or pain Would like to try soft diet today Last BM yesterday and last emesis yesterday  Objective: Vitals:   01/23/19 2050 01/23/19 2251 01/24/19 0625 01/24/19 1349  BP:  128/85 124/86  132/85  Pulse:  77 80 71  Resp:  '17 16 16  '$ Temp:  98.3 F (36.8 C) 97.8 F (36.6 C) 97.8 F (36.6 C)  TempSrc:  Oral Oral Oral  SpO2:  100% 99% 99%  Weight: 74.4 kg     Height: '5\' 8"'$  (1.727 m)       Intake/Output Summary (Last 24 hours) at 01/24/2019 1638 Last data filed at 01/24/2019 1314 Gross per 24 hour  Intake 2741.41 ml  Output 650 ml  Net 2091.41 ml   Filed Weights   01/23/19 2050  Weight: 74.4 kg    Examination:  General exam: Appears mildly ill appearing,  doesn't look like he feels good Respiratory system: Clear to auscultation. Respiratory effort normal. Cardiovascular system: S1 & S2 heard, RRR. Gastrointestinal system: Abdomen is nondistended, soft and nontender.  Central nervous system: Alert and oriented. No focal neurological deficits. Extremities: no LEE Skin: No rashes, lesions or ulcers Psychiatry: Judgement and insight appear normal. Mood & affect appropriate.     Data Reviewed: I have personally reviewed following labs and imaging studies  CBC: Recent Labs  Lab 01/23/19 1420 01/24/19 0617  WBC 14.3* 11.8*  NEUTROABS 11.3*  --   HGB 14.4 11.9*  HCT 40.5 33.5*  MCV 79.4* 79.2*  PLT 125* 94*   Basic Metabolic Panel: Recent Labs  Lab 01/23/19 1420 01/24/19 0617  NA 138 139  K 4.9 3.6  CL 103 106  CO2 27 25  GLUCOSE 136* 89  BUN 8 6  CREATININE 0.87 0.81  CALCIUM 9.8 9.0   GFR: Estimated Creatinine Clearance: 130.2 mL/min (by C-G formula based on SCr of 0.81 mg/dL). Liver Function Tests: Recent Labs  Lab 01/23/19 1420 01/24/19 0617  AST 10* 9*  ALT 20 17  ALKPHOS 56 46  BILITOT 1.8* 1.8*  PROT 9.0* 6.8  ALBUMIN 5.1* 4.2   Recent Labs  Lab 01/23/19 1420  LIPASE 25   No results for input(s): AMMONIA in the last 168 hours. Coagulation Profile: No results for input(s): INR, PROTIME in the last 168 hours. Cardiac Enzymes: No results for input(s): CKTOTAL, CKMB, CKMBINDEX, TROPONINI in the last 168 hours. BNP (last 3 results) No results for input(s): PROBNP in the last 8760 hours. HbA1C: No results for input(s): HGBA1C in the last 72 hours. CBG: No results for input(s): GLUCAP in the last 168 hours. Lipid Profile: No results for input(s): CHOL, HDL, LDLCALC, TRIG, CHOLHDL, LDLDIRECT in the last 72 hours. Thyroid Function Tests: No results for input(s): TSH, T4TOTAL, FREET4, T3FREE, THYROIDAB in the last 72 hours. Anemia Panel: Recent Labs    01/23/19 1420 01/23/19 2144  FERRITIN  --  387*   RETICCTPCT 4.3*  --    Sepsis Labs: Recent Labs  Lab 01/23/19 1725 01/23/19 2144  LATICACIDVEN 1.1 1.6    Recent Results (from the past 240 hour(s))  SARS Coronavirus 2 (CEPHEID - Performed in [place] hospital lab), Hosp Order     Status: None   Collection Time: 01/23/19  5:51 PM   Specimen: Nasopharyngeal Swab  Result Value Ref Range Status   SARS Coronavirus 2 NEGATIVE NEGATIVE Final    Comment: (NOTE) If result is NEGATIVE SARS-CoV-2 target nucleic acids are NOT DETECTED. The SARS-CoV-2 RNA is generally detectable in upper and lower  respiratory specimens during the acute phase of infection. The lowest  concentration of SARS-CoV-2 viral copies this assay can detect is 250  copies / mL. Sadi Arave negative result does not preclude SARS-CoV-2 infection  and should not be used as the sole basis for treatment or other  patient management decisions.  Nikolaj Geraghty negative result may occur with  improper specimen collection / handling, submission of specimen other  than nasopharyngeal swab, presence of viral mutation(s) within the  areas targeted by this assay, and inadequate number of viral copies  (<250 copies / mL). Amarius Toto negative result must be combined with clinical  observations, patient history, and epidemiological information. If result is POSITIVE SARS-CoV-2 target nucleic acids are DETECTED. The SARS-CoV-2 RNA is generally detectable in upper and lower  respiratory specimens dur ing the acute phase of infection.  Positive  results are indicative of active infection with SARS-CoV-2.  Clinical  correlation with patient history and other diagnostic information is  necessary to determine patient infection status.  Positive results do  not rule out bacterial infection or co-infection with other viruses. If result is PRESUMPTIVE POSTIVE SARS-CoV-2 nucleic acids MAY BE PRESENT.   Derian Pfost presumptive positive result was obtained on the submitted specimen  and confirmed on repeat testing.  While 2019  novel coronavirus  (SARS-CoV-2) nucleic acids may be present in the submitted sample  additional confirmatory testing may be necessary for epidemiological  and / or clinical management purposes  to differentiate between  SARS-CoV-2 and other Sarbecovirus currently known to infect humans.  If clinically indicated additional testing with an alternate test  methodology [phone]) is advised. The SARS-CoV-2 RNA is generally  detectable in upper and lower respiratory sp ecimens during the acute  phase of infection. The expected result is Negative. Fact Sheet for Patients:  StrictlyIdeas.no Fact Sheet for Healthcare Providers: BankingDealers.co.za This test is not yet approved or cleared by the [place] FDA and has been authorized for detection and/or diagnosis of SARS-CoV-2 by FDA under an Emergency Use Authorization (EUA).  This EUA will remain in effect (meaning this test can be used) for the duration of the COVID-19 declaration under Section 564(b)(1) of the Act, 21 U.S.C. section 360bbb-3(b)(1), unless the authorization is terminated or revoked sooner. Performed at [facility], [place] [address], [place], [place] [address]          Radiology Studies: Ct Abdomen Pelvis W Contrast  Result Date: 01/23/2019 CLINICAL DATA:  Abdominal pain, nausea, vomiting, sickle cell crisis EXAM: CT ABDOMEN AND PELVIS WITH CONTRAST TECHNIQUE: Multidetector CT imaging of the abdomen and pelvis was performed using the standard protocol following bolus administration of intravenous contrast. CONTRAST:  173m OMNIPAQUE IOHEXOL 300 MG/ML  SOLN COMPARISON:  12/15/2017 FINDINGS: Lower chest: No acute abnormality. Hepatobiliary: No solid liver abnormality is seen. No gallstones, gallbladder wall thickening, or biliary dilatation. Pancreas: Unremarkable. No pancreatic ductal dilatation or surrounding inflammatory changes. Spleen: Splenomegaly,  maximum coronal span 17.5 cm. Adrenals/Urinary Tract: Adrenal glands are unremarkable. Kidneys are normal, without renal calculi, solid lesion, or hydronephrosis. Bladder is unremarkable. Stomach/Bowel: Stomach is within normal limits. Appendix appears normal. No evidence of bowel wall thickening, distention, or inflammatory changes. Vascular/Lymphatic: No significant vascular findings are present. No enlarged abdominal or pelvic lymph nodes. Reproductive: No mass or other significant abnormality. Other: No abdominal wall hernia or abnormality. No abdominopelvic ascites. Musculoskeletal: No acute or significant osseous findings. IMPRESSION: 1.  No acute CT findings of the abdomen or pelvis to explain pain. 2. Splenomegaly, not significantly changed compared to prior examination. Electronically Signed   By: AEddie CandleM.D.   On: 01/23/2019 16:29   [place]dom[place] Limited Ruq  Result Date: 01/23/2019 CLINICAL DATA:  Bilious emesis.  Vomiting.  Hepatitis C. EXAM: ULTRASOUND ABDOMEN LIMITED RIGHT UPPER QUADRANT COMPARISON:  None. FINDINGS: Gallbladder: Gallbladder is poorly distended limiting evaluation with no obvious abnormalities. No stones, wall thickening, pericholecystic fluid, or Murphy's sign. Common bile duct: Not visualized. Liver: No focal lesion identified. Within normal limits in parenchymal echogenicity. Portal vein is patent on color Doppler imaging with normal direction of blood flow towards the liver. IMPRESSION: 1. The study is limited due to patient condition and inability to lie still. The common bile duct is not visualized. The gallbladder and liver are normal. Electronically Signed   By: Dorise Bullion III M.D   On: 01/23/2019 16:26        Scheduled Meds: . folic acid  1 mg Oral Daily  . gabapentin  100 mg Oral QHS  . heparin  5,000 Units Subcutaneous Q8H  . loratadine  10 mg Oral Daily  . pneumococcal 23 valent vaccine  0.5 mL Intramuscular Tomorrow-1000  . promethazine  25 mg  Intravenous Once   Continuous Infusions: . sodium chloride 125 mL/hr at 01/24/19 1205     LOS: 0 days    Time spent: over 30 min    Fayrene Helper, MD [facility] Pager AMION  If 7PM-7AM, please contact night-coverage [email or web address] Password [facility] 01/24/2019, 4:38 PM

## 2019-01-25 DIAGNOSIS — D582 Other hemoglobinopathies: Secondary | ICD-10-CM

## 2019-01-25 DIAGNOSIS — R161 Splenomegaly, not elsewhere classified: Secondary | ICD-10-CM

## 2019-01-25 LAB — COMPREHENSIVE METABOLIC PANEL
ALT: 18 U/L (ref 0–44)
AST: 10 U/L — ABNORMAL LOW (ref 15–41)
Albumin: 4.3 g/dL (ref 3.5–5.0)
Alkaline Phosphatase: 46 U/L (ref 38–126)
Anion gap: 9 (ref 5–15)
BUN: 7 mg/dL (ref 6–20)
CO2: 27 mmol/L (ref 22–32)
Calcium: 9.2 mg/dL (ref 8.9–10.3)
Chloride: 104 mmol/L (ref 98–111)
Creatinine, Ser: 0.84 mg/dL (ref 0.61–1.24)
GFR calc Af Amer: >60 mL/min (ref >60)
GFR calc non Af Amer: >60 mL/min (ref >60)
Glucose, Bld: 86 mg/dL (ref 70–99)
Potassium: 3.5 mmol/L (ref 3.5–5.1)
Sodium: 140 mmol/L (ref 135–145)
Total Bilirubin: 2.2 mg/dL — ABNORMAL HIGH (ref 0.3–1.2)
Total Protein: 7.6 g/dL (ref 6.5–8.1)

## 2019-01-25 LAB — URINE CULTURE: Culture: NO GROWTH

## 2019-01-25 LAB — CBC
HCT: 35.1 % — ABNORMAL LOW (ref 39.0–52.0)
Hemoglobin: 12.4 g/dL — ABNORMAL LOW (ref 13.0–17.0)
MCH: 28 pg (ref 26.0–34.0)
MCHC: 35.3 g/dL (ref 30.0–36.0)
MCV: 79.2 fL — ABNORMAL LOW (ref 80.0–100.0)
Platelets: 93 10*3/uL — ABNORMAL LOW (ref 150–400)
RBC: 4.43 MIL/uL (ref 4.22–5.81)
RDW: 14 % (ref 11.5–15.5)
WBC: 8.1 10*3/uL (ref 4.0–10.5)
nRBC: 0.4 % — ABNORMAL HIGH (ref 0.0–0.2)

## 2019-01-25 LAB — HAPTOGLOBIN: Haptoglobin: <10 mg/dL — ABNORMAL LOW (ref 17–317)

## 2019-01-25 LAB — MAGNESIUM: Magnesium: 2 mg/dL (ref 1.7–2.4)

## 2019-01-25 MED ORDER — PROMETHAZINE HCL 12.5 MG PO TABS: 12.5000 mg | ORAL_TABLET | Freq: Four times a day (QID) | ORAL | 0 refills | Status: DC | PRN

## 2019-01-25 MED ORDER — FOLIC ACID 1 MG PO TABS: 1.0000 mg | ORAL_TABLET | Freq: Every day | ORAL | 11 refills | Status: DC

## 2019-01-25 NOTE — Discharge Summary (Signed)
Physician Discharge Summary  [name]:[id number] DOB: 1990-09-15 DOA: 01/23/2019  PCP: Azzie Glatter, FNP  Admit date: 01/23/2019  Discharge date: 01/25/2019  Discharge Diagnoses:  Active Problems:   Viral gastroenteritis   Bilious vomiting without nausea   Discharge Condition: Stable  Disposition:  Follow-up Information    Azzie Glatter, FNP Follow up in 2 day(s).   Specialty: Family Medicine Contact information: [place] [address] [phone]          Pt is discharged home in good condition and is to follow up with Azzie Glatter, FNP in 2 weeks to have labs evaluated. Coady Train Buster is instructed to increase activity slowly and balance with rest for the next few days, and use prescribed medication to complete treatment of pain  Diet: Regular Wt Readings from Last 3 Encounters:  01/23/19 74.4 kg  01/19/19 75.3 kg  12/06/18 74 kg    History of present illness:  Austin [name], a 28 year old male with a medical history significant for sickle cell disease, type , chronic pain syndrome, and anemia of chronic disease presented to ER at [facility] with complaints of nausea, vomiting, and diarrhea.  Initial onset was around 7 AM prior to admission.  Patient also endorsed some associated back pain that was not consistent with typical sickle cell pain crisis. Patient denied any other joint pain, chest pain, or shortness of breath. Patient denies sick contacts, recent travel, or exposure to COVID-19.  He reported that the nausea, vomiting, and diarrhea is severe and began morning prior to admission.  Symptoms are also associated with abdominal pain.  ER course: WBCs 14.3, hemoglobin 14.4, CMP with normal LFTs.  Total bilirubin mildly elevated at 1.8. CT of abdomen and pelvis negative for any acute findings with chronic known splenomegaly.  In ER patient received 2 L fluid, pain meds, and some antiemetics.  Patient had  COVID test prior to admission which was negative                     Hospital Course:  Patient was admitted for possible gastroenteritis.  CT of abdomen was negative. Patient was admitted for sickle cell pain crisis and managed appropriately with IVF, IV Dilaudid via PCA and IV Toradol, as well as other adjunct therapies per sickle cell pain management protocols.  Patient was continued on fluid resuscitation and antiemetics throughout admission.  Promethazine 12.5 mg every 6 hours as needed for nausea and vomiting was sent to patient's pharmacy.  Patient advised to follow bland diet for the next 24 hours, he was given written information. Advance diet as tolerated.   Nausea, vomiting, and diarrhea resolved.  Sickle cell anemia with pain crisis: Pain was managed with high concentration PCA Dilaudid with settings of 0.5 mg, 10-minute lockout, and 3 mg/h.  Patient states that pain has resolved.Patient has a history of chronic pain. Transitioned to MSIR 15 mg every 4 hours.   Patient advised to follow-up with PCP in 2 weeks to repeat labs.  Also, follow-up for medication management.   Patient alert, oriented, and ambulating without assistance. He is afebrile and oxygen saturation is greater than 90% on RA.   Patient was discharged home today in a hemodynamically stable condition.   Discharge Exam: Vitals:   01/25/19 0953 01/25/19 1118  BP: 124/86   Pulse: 79   Resp: (!) 21 16  Temp: 98.3 F (36.8 C)   SpO2: 100% 100%  Vitals:   01/25/19 0512 01/25/19 0737 01/25/19 0953 01/25/19 1118  BP: 121/83  124/86   Pulse: 72  79   Resp: 15 15 (!) 21 16  Temp: 98.6 F (37 C)  98.3 F (36.8 C)   TempSrc: Oral  Oral   SpO2: 100% 99% 100% 100%  Weight:      Height:        General appearance : Awake, alert, not in any distress. Speech Clear. Not toxic looking HEENT: Atraumatic and Normocephalic, pupils equally reactive to light and accomodation Neck: Supple, no JVD. No cervical  lymphadenopathy.  Chest: Good air entry bilaterally, no added sounds  CVS: S1 S2 regular, no murmurs.  Abdomen: Bowel sounds present, Non tender and not distended with no gaurding, rigidity or rebound. Extremities: B/L Lower Ext shows no edema, both legs are warm to touch Neurology: Awake alert, and oriented X 3, CN II-XII intact, Non focal Skin: No Rash  Discharge Instructions  Discharge Instructions    Discharge patient   Complete by: As directed    Discharge disposition: 01-Home or Self Care   Discharge patient date: 01/25/2019     Allergies as of 01/25/2019   No Known Allergies     Medication List    STOP taking these medications   gabapentin 100 MG capsule Commonly known as: NEURONTIN   ondansetron 8 MG disintegrating tablet Commonly known as: Zofran ODT     TAKE these medications   acetaminophen 500 MG tablet Commonly known as: TYLENOL Take 1 tablet (500 mg total) by mouth 2 (two) times daily.   diphenhydrAMINE 25 MG tablet Commonly known as: BENADRYL Take 25 mg by mouth every 6 (six) hours as needed for allergies.   folic acid 1 MG tablet Commonly known as: FOLVITE Take 1 tablet (1 mg total) by mouth daily.   ibuprofen 800 MG tablet Commonly known as: ADVIL Take 1 tablet (800 mg total) by mouth every 8 (eight) hours as needed.   loratadine 10 MG tablet Commonly known as: CLARITIN Take 1 tablet (10 mg total) by mouth daily.   morphine 15 MG tablet Commonly known as: MSIR Take 1 tablet (15 mg total) by mouth every 4 (four) hours as needed for up to 15 days for severe pain.   promethazine 12.5 MG tablet Commonly known as: PHENERGAN Take 1 tablet (12.5 mg total) by mouth every 6 (six) hours as needed for nausea or vomiting.       The results of significant diagnostics from this hospitalization (including imaging, microbiology, ancillary and laboratory) are listed below for reference.    Significant Diagnostic Studies: Ct Abdomen Pelvis W  Contrast  Result Date: 01/23/2019 CLINICAL DATA:  Abdominal pain, nausea, vomiting, sickle cell crisis EXAM: CT ABDOMEN AND PELVIS WITH CONTRAST TECHNIQUE: Multidetector CT imaging of the abdomen and pelvis was performed using the standard protocol following bolus administration of intravenous contrast. CONTRAST:  100mL OMNIPAQUE IOHEXOL 300 MG/ML  SOLN COMPARISON:  12/15/2017 FINDINGS: Lower chest: No acute abnormality. Hepatobiliary: No solid liver abnormality is seen. No gallstones, gallbladder wall thickening, or biliary dilatation. Pancreas: Unremarkable. No pancreatic ductal dilatation or surrounding inflammatory changes. Spleen: Splenomegaly, maximum coronal span 17.5 cm. Adrenals/Urinary Tract: Adrenal glands are unremarkable. Kidneys are normal, without renal calculi, solid lesion, or hydronephrosis. Bladder is unremarkable. Stomach/Bowel: Stomach is within normal limits. Appendix appears normal. No evidence of bowel wall thickening, distention, or inflammatory changes. Vascular/Lymphatic: No significant vascular findings are present. No enlarged abdominal or pelvic lymph nodes. Reproductive: No  mass or other significant abnormality. Other: No abdominal wall hernia or abnormality. No abdominopelvic ascites. Musculoskeletal: No acute or significant osseous findings. IMPRESSION: 1.  No acute CT findings of the abdomen or pelvis to explain pain. 2. Splenomegaly, not significantly changed compared to prior examination. Electronically Signed   By: Lauralyn PrimesAlex  Bibbey M.D.   On: 01/23/2019 16:29   [place] Abdomen Limited Ruq  Result Date: 01/23/2019 CLINICAL DATA:  Bilious emesis.  Vomiting.  Hepatitis C. EXAM: ULTRASOUND ABDOMEN LIMITED RIGHT UPPER QUADRANT COMPARISON:  None. FINDINGS: Gallbladder: Gallbladder is poorly distended limiting evaluation with no obvious abnormalities. No stones, wall thickening, pericholecystic fluid, or Murphy's sign. Common bile duct: Not visualized. Liver: No focal lesion identified.  Within normal limits in parenchymal echogenicity. Portal vein is patent on color Doppler imaging with normal direction of blood flow towards the liver. IMPRESSION: 1. The study is limited due to patient condition and inability to lie still. The common bile duct is not visualized. The gallbladder and liver are normal. Electronically Signed   By: Gerome Samavid  Williams III M.D   On: 01/23/2019 16:26    Microbiology: Recent Results (from the past 240 hour(s))  Urine culture     Status: None   Collection Time: 01/23/19  1:34 PM   Specimen: Urine, Random  Result Value Ref Range Status   Specimen Description   Final    URINE, RANDOM Performed at [facility], [address] [place], [place] [address]    Special Requests   Final    NONE Performed at [facility], [address] [place], [place] [address]    Culture   Final    NO GROWTH Performed at [facility], [address] [place], [place] [address]    Report Status 01/25/2019 FINAL  Final  SARS Coronavirus 2 (CEPHEID - Performed in [facility] hospital lab), Hosp Order     Status: None   Collection Time: 01/23/19  5:51 PM   Specimen: Nasopharyngeal Swab  Result Value Ref Range Status   SARS Coronavirus 2 NEGATIVE NEGATIVE Final    Comment: (NOTE) If result is NEGATIVE SARS-CoV-2 target nucleic acids are NOT DETECTED. The SARS-CoV-2 RNA is generally detectable in upper and lower  respiratory specimens during the acute phase of infection. The lowest  concentration of SARS-CoV-2 viral copies this assay can detect is 250  copies / mL. A negative result does not preclude SARS-CoV-2 infection  and should not be used as the sole basis for treatment or other  patient management decisions.  A negative result may occur with  improper specimen collection / handling, submission of specimen other  than nasopharyngeal swab, presence of viral mutation(s) within the  areas targeted by this assay, and  inadequate number of viral copies  (<250 copies / mL). A negative result must be combined with clinical  observations, patient history, and epidemiological information. If result is POSITIVE SARS-CoV-2 target nucleic acids are DETECTED. The SARS-CoV-2 RNA is generally detectable in upper and lower  respiratory specimens dur ing the acute phase of infection.  Positive  results are indicative of active infection with SARS-CoV-2.  Clinical  correlation with patient history and other diagnostic information is  necessary to determine patient infection status.  Positive results do  not rule out bacterial infection or co-infection with other viruses. If result is PRESUMPTIVE POSTIVE SARS-CoV-2 nucleic acids MAY BE PRESENT.   A presumptive positive result was obtained on the submitted specimen  and confirmed on repeat  testing.  While 2019 novel coronavirus  (SARS-CoV-2) nucleic acids may be present in the submitted sample  additional confirmatory testing may be necessary for epidemiological  and / or clinical management purposes  to differentiate between  SARS-CoV-2 and other Sarbecovirus currently known to infect humans.  If clinically indicated additional testing with an alternate test  methodology [phone]([id number]) is advised. The SARS-CoV-2 RNA is generally  detectable in upper and lower respiratory sp ecimens during the acute  phase of infection. The expected result is Negative. Fact Sheet for Patients:  BoilerBrush.com.cyhttps://www.fda.gov/media/136312/download Fact Sheet for Healthcare Providers: https://pope.com/https://www.fda.gov/media/136313/download This test is not yet approved or cleared by the [place] FDA and has been authorized for detection and/or diagnosis of SARS-CoV-2 by FDA under an Emergency Use Authorization (EUA).  This EUA will remain in effect (meaning this test can be used) for the duration of the COVID-19 declaration under Section 564(b)(1) of the Act, 21 U.S.C. section 360bbb-3(b)(1), unless the  authorization is terminated or revoked sooner. Performed at [facility], [address] [place], [place] [address]      Labs: Basic Metabolic Panel: Recent Labs  Lab 01/23/19 1420 01/24/19 0617 01/25/19 0401  NA 138 139 140  K 4.9 3.6 3.5  CL 103 106 104  CO2 27 25 27   GLUCOSE 136* 89 86  BUN 8 6 7   CREATININE 0.87 0.81 0.84  CALCIUM 9.8 9.0 9.2  MG  --   --  2.0   Liver Function Tests: Recent Labs  Lab 01/23/19 1420 01/24/19 0617 01/25/19 0401  AST 10* 9* 10*  ALT 20 17 18   ALKPHOS 56 46 46  BILITOT 1.8* 1.8* 2.2*  PROT 9.0* 6.8 7.6  ALBUMIN 5.1* 4.2 4.3   Recent Labs  Lab 01/23/19 1420  LIPASE 25   No results for input(s): AMMONIA in the last 168 hours. CBC: Recent Labs  Lab 01/23/19 1420 01/24/19 0617 01/25/19 0401  WBC 14.3* 11.8* 8.1  NEUTROABS 11.3*  --   --   HGB 14.4 11.9* 12.4*  HCT 40.5 33.5* 35.1*  MCV 79.4* 79.2* 79.2*  PLT 125* 94* 93*   Cardiac Enzymes: No results for input(s): CKTOTAL, CKMB, CKMBINDEX, TROPONINI in the last 168 hours. BNP: Invalid input(s): POCBNP CBG: No results for input(s): GLUCAP in the last 168 hours.  Time coordinating discharge: 50 minutes  Signed:  Nolon NationsLachina Moore Artrell Lawless  APRN, MSN, FNP-C [facility] [address]  [place], [place] [address] [phone]  [facility] 01/25/2019, 12:35 PM

## 2019-01-25 NOTE — Progress Notes (Signed)
Discharge instructions given to pt and all questions were answered.  

## 2019-01-29 ENCOUNTER — Encounter: Payer: Self-pay | Admitting: Family Medicine

## 2019-01-29 ENCOUNTER — Ambulatory Visit: Payer: 59 | Admitting: Family Medicine

## 2019-01-29 ENCOUNTER — Other Ambulatory Visit: Payer: Self-pay

## 2019-01-29 ENCOUNTER — Ambulatory Visit (INDEPENDENT_AMBULATORY_CARE_PROVIDER_SITE_OTHER): Payer: 59 | Admitting: Family Medicine

## 2019-01-29 VITALS — BP 128/78 | HR 86 | Temp 98.1°F | Ht 68.0 in | Wt 171.0 lb

## 2019-01-29 DIAGNOSIS — R112 Nausea with vomiting, unspecified: Secondary | ICD-10-CM | POA: Diagnosis not present

## 2019-01-29 DIAGNOSIS — F1190 Opioid use, unspecified, uncomplicated: Secondary | ICD-10-CM

## 2019-01-29 DIAGNOSIS — D571 Sickle-cell disease without crisis: Secondary | ICD-10-CM | POA: Diagnosis not present

## 2019-01-29 DIAGNOSIS — D582 Other hemoglobinopathies: Secondary | ICD-10-CM

## 2019-01-29 DIAGNOSIS — G894 Chronic pain syndrome: Secondary | ICD-10-CM

## 2019-01-29 DIAGNOSIS — Z09 Encounter for follow-up examination after completed treatment for conditions other than malignant neoplasm: Secondary | ICD-10-CM | POA: Diagnosis not present

## 2019-01-29 LAB — POCT URINALYSIS DIP (MANUAL ENTRY)
Bilirubin, UA: NEGATIVE
Blood, UA: NEGATIVE
Glucose, UA: NEGATIVE mg/dL
Ketones, POC UA: NEGATIVE mg/dL
Leukocytes, UA: NEGATIVE
Nitrite, UA: NEGATIVE
Protein Ur, POC: NEGATIVE mg/dL
Spec Grav, UA: 1.015 (ref 1.010–1.025)
Urobilinogen, UA: 2 E.U./dL — AB
pH, UA: 7.5 (ref 5.0–8.0)

## 2019-01-29 NOTE — Progress Notes (Signed)
[facility]   Established Patient Office Visit  Subjective:  Patient ID: Austin MikeKeith D Bohan, male    DOB: June 07, 1991  Age: 28 y.o. MRN: [id number]  CC:  Chief Complaint  Patient presents with  . Hospitalization Follow-up    sickle cell    HPI Austin [name] is a 28 year old male who presents for follow up today.   Past Medical History:  Diagnosis Date  . Acute maxillary sinusitis   . Eczema   . Sickle cell anemia (HCC)    Current Status: Since his last office visit, he has had an ED visit for Nausea and Vomiting on 01/23/2019. He is doing well with no complaints. He states that he has pain in his arms and legs. He rates his pain today at 5/10. He has not had a hospital visit for Sickle Cell Crisis since  01/23/2019 where he was treated and discharged on 01/25/2019. He is currently taking all medications as prescribed and staying well hydrated. He reports occasional nausea, constipation, dizziness and headaches.   He denies fevers, chills, fatigue, recent infections, weight loss, and night sweats. He has not had any visual changes, and falls. No chest pain, heart palpitations, cough and shortness of breath reported. No reports of GI problems such as vomiting, and diarrhea. He has no reports of blood in stools, dysuria and hematuria. No depression or anxiety reported. He denies pain today.   Past Surgical History:  Procedure Laterality Date  . WISDOM TOOTH EXTRACTION      Family History  Problem Relation Age of Onset  . Sickle cell trait Mother   . Diabetes Father   . Hypertension Father     Social History   Socioeconomic History  . Marital status: Married    Spouse name: Not on file  . Number of children: Not on file  . Years of education: Not on file  . Highest education level: Not on file  Occupational History  . Not on file  Social Needs  . Financial resource strain: Not on file  . Food insecurity    Worry: Not  on file    Inability: Not on file  . Transportation needs    Medical: Not on file    Non-medical: Not on file  Tobacco Use  . Smoking status: Former Smoker    Packs/day: 0.10  . Smokeless tobacco: Never Used  Substance and Sexual Activity  . Alcohol use: Not Currently    Comment: occ  . Drug use: Yes    Types: Marijuana  . Sexual activity: Not on file  Lifestyle  . Physical activity    Days per week: Not on file    Minutes per session: Not on file  . Stress: Not on file  Relationships  . Social Musicianconnections    Talks on phone: Not on file    Gets together: Not on file    Attends religious service: Not on file    Active member of club or organization: Not on file    Attends meetings of clubs or organizations: Not on file    Relationship status: Not on file  . Intimate partner violence    Fear of current or ex partner: Not on file    Emotionally abused: Not on file    Physically abused: Not on file    Forced sexual activity: Not on file  Other Topics Concern  . Not on file  Social History Narrative  .  Not on file    Outpatient Medications Prior to Visit  Medication Sig Dispense Refill  . acetaminophen (TYLENOL) 500 MG tablet Take 1 tablet (500 mg total) by mouth 2 (two) times daily. (Patient not taking: Reported on 01/19/2019) 60 tablet 3  . diphenhydrAMINE (BENADRYL) 25 MG tablet Take 25 mg by mouth every 6 (six) hours as needed for allergies.    . folic acid (FOLVITE) 1 MG tablet Take 1 tablet (1 mg total) by mouth daily. 30 tablet 11  . ibuprofen (ADVIL,MOTRIN) 800 MG tablet Take 1 tablet (800 mg total) by mouth every 8 (eight) hours as needed. (Patient not taking: Reported on 01/23/2019) 30 tablet 1  . loratadine (CLARITIN) 10 MG tablet Take 1 tablet (10 mg total) by mouth daily. 30 tablet 6  . morphine (MSIR) 15 MG tablet Take 1 tablet (15 mg total) by mouth every 4 (four) hours as needed for up to 15 days for severe pain. 90 tablet 0  . promethazine (PHENERGAN) 12.5 MG  tablet Take 1 tablet (12.5 mg total) by mouth every 6 (six) hours as needed for nausea or vomiting. 30 tablet 0   No facility-administered medications prior to visit.     No Known Allergies  ROS Review of Systems  Constitutional: Negative.   HENT: Negative.   Eyes: Negative.   Respiratory: Negative.   Cardiovascular: Negative.   Gastrointestinal: Positive for constipation (occasional ) and nausea (Occasional ).  Endocrine: Negative.   Genitourinary: Negative.   Skin: Negative.   Allergic/Immunologic: Negative.   Neurological: Positive for dizziness (occasional) and headaches (Occasional).  Hematological: Negative.   Psychiatric/Behavioral: Negative.    Objective:    Physical Exam  Constitutional: He is oriented to person, place, and time. He appears well-developed and well-nourished.  HENT:  Head: Normocephalic and atraumatic.  Eyes: Conjunctivae are normal.  Neck: Normal range of motion. Neck supple.  Cardiovascular: Normal rate, regular rhythm, normal heart sounds and intact distal pulses.  Pulmonary/Chest: Effort normal and breath sounds normal.  Abdominal: Soft. Bowel sounds are normal.  Musculoskeletal: Normal range of motion.  Neurological: He is alert and oriented to person, place, and time. He has normal reflexes.  Skin: Skin is warm and dry.  Psychiatric: He has a normal mood and affect. His behavior is normal. Judgment and thought content normal.  Nursing note and vitals reviewed.   BP 128/78 (BP Location: Left Arm, Patient Position: Sitting, Cuff Size: Small)   Pulse 86   Temp 98.1 F (36.7 C) (Oral)   Ht 5\' 8"  (1.727 m)   Wt 171 lb (77.6 kg)   SpO2 100%   BMI 26.00 kg/m  Wt Readings from Last 3 Encounters:  01/29/19 171 lb (77.6 kg)  01/23/19 164 lb (74.4 kg)  01/19/19 166 lb (75.3 kg)     There are no preventive care reminders to display for this patient.  There are no preventive care reminders to display for this patient.  Lab Results   Component Value Date   TSH 0.662 08/12/2017   Lab Results  Component Value Date   WBC 8.1 01/25/2019   HGB 12.4 (L) 01/25/2019   HCT 35.1 (L) 01/25/2019   MCV 79.2 (L) 01/25/2019   PLT 93 (L) 01/25/2019   Lab Results  Component Value Date   NA 140 01/25/2019   K 3.5 01/25/2019   CO2 27 01/25/2019   GLUCOSE 86 01/25/2019   BUN 7 01/25/2019   CREATININE 0.84 01/25/2019   BILITOT 2.2 (H) 01/25/2019  ALKPHOS 46 01/25/2019   AST 10 (L) 01/25/2019   ALT 18 01/25/2019   PROT 7.6 01/25/2019   ALBUMIN 4.3 01/25/2019   CALCIUM 9.2 01/25/2019   ANIONGAP 9 01/25/2019   No results found for: CHOL No results found for: HDL No results found for: LDLCALC No results found for: TRIG No results found for: [facility] Lab Results  Component Value Date   HGBA1C 4.2 08/12/2017    Assessment & Plan:   1. Hospital discharge follow-up  2. Nausea and vomiting, intractability of vomiting not specified, unspecified vomiting type Resolved.  3. Sickle cell anemia without crisis [facility](HCC) He is doing well today. He will continue to take pain medications as prescribed; will continue to avoid extreme heat and cold; will continue to eat a healthy diet and drink at least 64 ounces of water daily; continue stool softener as needed; will avoid colds and flu; will continue to get plenty of sleep and rest; will continue to avoid high stressful situations and remain infection free; will continue Folic Acid 1 mg daily to avoid sickle cell crisis.   4. Hemoglobin C-C disease (HCC)  5. Chronic pain syndrome  6. Chronic, continuous use of opioids  7. Follow up He will follow up in 2 months.  - POCT urinalysis dipstick  No orders of the defined types were placed in this encounter.   Orders Placed This Encounter  Procedures  . POCT urinalysis dipstick    Referral Orders  No referral(s) requested today    Raliegh IpNatalie Armel Rabbani,  MSN, FNP-BC [facility] [address]  [place], [place] [address] [phone]  Problem List Items Addressed This Visit      Other   Hemoglobin C-C disease [facility](HCC)    Other Visit Diagnoses    Hospital discharge follow-up    -  Primary   Nausea and vomiting, intractability of vomiting not specified, unspecified vomiting type       Sickle cell anemia without crisis (HCC)       Chronic pain syndrome       Chronic, continuous use of opioids       Follow up       Relevant Orders   POCT urinalysis dipstick      No orders of the defined types were placed in this encounter.   Follow-up: No follow-ups on file.    Kallie LocksNatalie M Shironda Kain, FNP

## 2019-01-31 ENCOUNTER — Other Ambulatory Visit: Payer: Self-pay | Admitting: *Deleted

## 2019-01-31 NOTE — Patient Outreach (Signed)
[facility]) Care Management  01/31/2019  Austin [name] 1991/03/29 [id number]   Transition of care   Referral received :7/2  Insurance :  [facility] admission 6/30-7/2   Subjective  Outreach call to patient for transition of care . Discussed HTN care management [facility] transition of care follow up, patient voiced understanding .   Patient discussed recent hospital admission related to nausea, sickle cell crisis and gastroenteritis .  Patient states that he is doing much better , taking it easy . He discussed following bland diet as instructed at discharge, avoiding fried foods, and tolerating diet well, he  reports taking phenergan about once a day. He denies abdominal discomfort on today.  He reports that he has attended post discharge visit with PCP and has a written note to be out of work until 7/13. Patient discussed living at home with his wife, he reports having food supply, able to purchase his medications as his wife works.  Patient discussed being aware that his ST disability does not kick in until 21 days. He discussed that is wife plans to donate pal time to him, discussed being able to speak with his supervisior regarding pal donation and human resource regarding "caring for each other fund", he states that he has used the Caring for you fund once at birth of his son  and understands that it is a one time  use, he voiced appreciation of follow.,  Patient denies any further care management needs, he denies need for additional education material on disease management.  Objective Chart reviewed , patient admitted 6/30-7/2, Dx: Nausea, gastroenteritis and sickle cell crisis.  Assessment  Transition of care follow up call completed , no care management needs. Will proceed with case closure  Plan Will send successful outreach letter, with [facility] pamphlet and magnet.  Will close case ,no ongoing care management needs identified.    Joylene Draft, RN, [place] Management Coordinator  [phone]- Mobile [phone]- Toll Free Main Office

## 2019-02-13 ENCOUNTER — Encounter: Payer: Self-pay | Admitting: Family Medicine

## 2019-02-13 ENCOUNTER — Other Ambulatory Visit: Payer: Self-pay

## 2019-02-13 ENCOUNTER — Ambulatory Visit (INDEPENDENT_AMBULATORY_CARE_PROVIDER_SITE_OTHER): Payer: 59 | Admitting: Family Medicine

## 2019-02-13 VITALS — BP 116/64 | HR 84 | Temp 98.3°F | Ht 68.0 in | Wt 172.0 lb

## 2019-02-13 DIAGNOSIS — D582 Other hemoglobinopathies: Secondary | ICD-10-CM | POA: Diagnosis not present

## 2019-02-13 DIAGNOSIS — G894 Chronic pain syndrome: Secondary | ICD-10-CM | POA: Diagnosis not present

## 2019-02-13 DIAGNOSIS — Z09 Encounter for follow-up examination after completed treatment for conditions other than malignant neoplasm: Secondary | ICD-10-CM | POA: Diagnosis not present

## 2019-02-13 DIAGNOSIS — R112 Nausea with vomiting, unspecified: Secondary | ICD-10-CM | POA: Diagnosis not present

## 2019-02-13 DIAGNOSIS — D571 Sickle-cell disease without crisis: Secondary | ICD-10-CM | POA: Diagnosis not present

## 2019-02-13 DIAGNOSIS — R413 Other amnesia: Secondary | ICD-10-CM

## 2019-02-13 DIAGNOSIS — F1190 Opioid use, unspecified, uncomplicated: Secondary | ICD-10-CM

## 2019-02-13 MED ORDER — PROMETHAZINE HCL 12.5 MG PO TABS: 12.5000 mg | ORAL_TABLET | Freq: Four times a day (QID) | ORAL | 1 refills | Status: DC | PRN

## 2019-02-13 NOTE — Patient Instructions (Signed)
Preventing Heat Exhaustion, Adult Heat exhaustion happens when your body gets too hot (overheated) from hot weather or from exercise. Untreated heat exhaustion could lead to heat stroke. Heat stroke can be deadly. How can heat exhaustion affect me? Early warning signs of heat exhaustion are:  Weakness.  Fatigue.  Stomach cramps.  Arm pain.  Leg cramps. Later symptoms of heat exhaustion include:  Heavy sweating.  Clammy skin.  Rapid, weak pulse.  Nausea or vomiting.  Dizziness.  Headache.  Fainting. If you have signs and symptoms of heat exhaustion, move to a cool place, loosen your clothing, and drink water or a sports drink. Then, put cool, wet compresses on your body or get into a cool bath or shower. What can increase my risk? People who work or exercise outside in hot weather have the highest risk of heat exhaustion. You may also be at higher risk if you:  Are over age 65. Older adults have a greater risk for heat exhaustion than younger adults.  Are overweight.  Have high blood pressure.  Have heart disease. What actions can I take to prevent heat exhaustion?      Avoid being outside on very hot days. Check your local news for extreme heat alerts or warnings.  In extreme heat, stay in an air-conditioned environment until the temperature cools off.  Check with your health care provider before starting any new exercise or activity. Ask about any health conditions or medicines that might increase your risk for heat exhaustion.  Wear lightweight, light-colored, and loose-fitting clothing in warm weather.  Do outdoor activities when it is cooler. This may be in the morning, late afternoon, or evening. Take breaks in the shade.  Do not work or exercise in the heat when you feel unwell or have been sick.  Start any new work or exercise activity gradually.  Protect yourself from the sun by wearing a broad-brimmed hat and using at least SPF 15 broad-spectrum  sunscreen.  Drink enough water or sports drink to keep your urine pale yellow. When it is hot, drink every 15 to 20 minutes, even if you are not thirsty.  Do not go out in the heat after a heavy meal.  Do not drink alcohol or caffeinated drinks when it is very hot outside.  If you have friends or family members who are older adults: ? Do not leave an older adult alone in a hot car. ? Make sure older adults have access to air-conditioning on very hot days. Remind them to drink enough fluids. Check on them at least twice a day if you can. Where to find more information  Centers for Disease Control and Prevention (CDC): https://www.cdc.gov/disasters/extremeheat/warning.html  American Academy of Family Physicians (AAFP): https://familydoctor.org/condition/heat-exhaustion-heatstroke  American Academy of Orthopaedic Surgeons (AAOS): https://orthoinfo.aaos.org/en/diseases--conditions/heat-injury-and-heat-exhaustion Contact a health care provider if:  You faint.  You feel weak or dizzy.  You have any signs or symptoms of heat exhaustion that last more than one hour. Get help right away if you have signs of heat stroke:  Body temperature of 103F (39.4C) or higher.  Hot, dry, red skin.  Fast, thumping pulse.  Confusion.  Loss of consciousness. Summary  Heat exhaustion happens when your body gets overheated and cannot cool down.  Avoid being outside on very hot days. Check your local news for extreme heat alerts or warnings. When you are out in the heat, take steps to protect yourself from the sun and stay hydrated.  If you have signs or symptoms of heat   exhaustion, get out of the heat, drink fluids, take steps to cool down, and contact a health care provider.  Get help right away if you have signs or symptoms of heat stroke. This information is not intended to replace advice given to you by your health care provider. Make sure you discuss any questions you have with your health  care provider. Document Released: 10/19/2017 Document Revised: 10/19/2017 Document Reviewed: 10/19/2017 Elsevier Patient Education  [place] exhaustion happens when your body gets overheated from hot weather or from exercise. It is important to take care of yourself and to treat heat exhaustion as soon as possible. Untreated heat exhaustion can lead to heat cramps, which are the mildest form of heat-related illness. If untreated, heat exhaustion can lead to heat stroke, which is a life-threatening condition that requires emergency care. What are the causes? Common causes of this condition include:  Exercising or being active in hot or humid weather.  Exposure to hot or humid weather.  Spending time outdoors in the bright sunshine.  Not drinking enough water.  Being dehydrated.  Being overdressed in warm weather. What increases the risk? The following factors may make you more likely to develop this condition:  Exercising beyond your fitness level.  Wearing clothing that does not allow sweat to evaporate.  Drinking a lot of alcoholic beverages or beverages that have caffeine. This can lead to dehydration.  Being age 29 or older.  Being a child.  Having a medical condition such as heart disease, poor circulation, sickle cell disease, or high blood pressure.  Having a fever.  Being very overweight (obese).  Taking certain medicines. These include medicines for high blood pressure, antihistamines, tranquilizers, and illegal drugs such as cocaine and amphetamines. What are the signs or symptoms? Symptoms of heat exhaustion include:  Heavy sweating along with feeling weak, dizzy, light-headed, and nauseous.  Rapid heartbeat.  Headache.  Urine that is darker than normal.  Muscle cramps, such as in the leg or side (flank).  Moist, cool, and clammy skin.  Fatigue.  Thirst.  Confusion.  Fainting. Signs and symptoms may develop  suddenly or over time. How is this diagnosed? This condition may be diagnosed with a physical exam. The diagnosis is confirmed when core body temperature, measured by a rectal thermometer, reaches 104.12F degrees or higher. How is this treated? This condition may be treated by having:  Cooling blankets placed on you to lower your body temperature.  Fluids given to you through an IV in a hospital. Follow these instructions at home: If you think that you have heat exhaustion, call your health care provider. Follow his or her instructions. You should also:  Ask a friend or a family member to stay with you.  Move to a cooler location, such as: ? Into the shade. ? In front of a fan. ? Into an air-conditioned space.  Lie down and rest.  Slowly drink nonalcoholic, caffeine-free fluids.  Take off tight clothing or extra clothing.  Take a cool bath or shower, if possible. If you do not have access to a bath or shower, dab or mist cool water on your skin.  If symptoms last for more than 30 minutes, seek medical care. Contact a health care provider if:  Symptoms last for more than 30 minutes. Get help right away if:  You have any symptoms of heat stroke. These include: ? Fever. ? Vomiting. ? Red skin. ? Inability to sweat, resulting in hot, dry skin. ?  Excessive thirst. ? Rapid breathing. ? Headache. ? Confusion or disorientation. ? Fainting. ? Seizures. These symptoms may represent a serious problem that is an emergency. Do not wait to see if the symptoms will go away. Get medical help right away. Call your local emergency services (911 in the [place] Do not drive yourself to the hospital. Summary  Heat exhaustion happens when your body gets overheated from hot weather, exercise, or dehydration.  Symptoms include heavy sweating, confusion, rapid heartbeat, and muscle cramps.  If your symptoms last for more than 30 minutes, contact a health care provider. This information is  not intended to replace advice given to you by your health care provider. Make sure you discuss any questions you have with your health care provider. Document Released: 04/20/2008 Document Revised: 04/06/2018 Document Reviewed: 04/06/2018 Elsevier Patient Education  2020 [employer].

## 2019-02-13 NOTE — Progress Notes (Signed)
Patient Austin [name]   Sick Visit  Subjective:  Patient ID: Austin [name], male    DOB: November 04, 1990  Age: 28 y.o. MRN: [id number]  CC:  Chief Complaint  Patient presents with  . Memory Loss  . Paperwork    HPI [name] is a 28 year old male who presents for Sick Visit today.   Past Medical History:  Diagnosis Date  . Acute maxillary sinusitis   . Eczema   . Sickle cell anemia (HCC)     Past Surgical History:  Procedure Laterality Date  . WISDOM TOOTH EXTRACTION     Current Status: Since his last office visit, he is doing well. He states that he has pain in his arms and legs. He rates his pain today at 0/10. He has not had a hospital visit for Sickle Cell Crisis since 01/23/2019 where he was treated and discharged the same day. He is currently taking all medications as prescribed and staying well hydrated. He reports occasional nausea, constipation, dizziness and headaches. He has not had any visual changes. He reports recent incidents of memory disturbance for 1 week now, like forgetting what time he should be at work, or misplacing his keys. He does report that he was recently working outside 1 week ago in the sun for a few hours. He has not taken any medication for relief of symptoms. He has not had any head trauma or falls. His anxiety is mild today. He denies suicidal ideations, homicidal ideations, or auditory hallucinations.  He denies fevers, chills, fatigue, recent infections, weight loss, and night sweats.  No chest pain, heart palpitations, cough and shortness of breath reported. No reports of GI problems such as diarrhea. He has no reports of blood in stools, dysuria and hematuria.   Family History  Problem Relation Age of Onset  . Sickle cell trait Mother   . Diabetes Father   . Hypertension Father     Social History   Socioeconomic History  . Marital status: Married    Spouse name: Not on file  . Number of  children: Not on file  . Years of education: Not on file  . Highest education level: Not on file  Occupational History  . Not on file  Social Needs  . Financial resource strain: Not on file  . Food insecurity    Worry: Not on file    Inability: Not on file  . Transportation needs    Medical: Not on file    Non-medical: Not on file  Tobacco Use  . Smoking status: Former Smoker    Packs/day: 0.10  . Smokeless tobacco: Never Used  Substance and Sexual Activity  . Alcohol use: Not Currently    Comment: occ  . Drug use: Yes    Types: Marijuana  . Sexual activity: Not on file  Lifestyle  . Physical activity    Days per week: Not on file    Minutes per session: Not on file  . Stress: Not on file  Relationships  . Social Herbalist on phone: Not on file    Gets together: Not on file    Attends religious service: Not on file    Active member of club or organization: Not on file    Attends meetings of clubs or organizations: Not on file    Relationship status: Not on file  . Intimate partner violence    Fear of current or ex partner:  Not on file    Emotionally abused: Not on file    Physically abused: Not on file    Forced sexual activity: Not on file  Other Topics Concern  . Not on file  Social History Narrative  . Not on file    Outpatient Medications Prior to Visit  Medication Sig Dispense Refill  . diphenhydrAMINE (BENADRYL) 25 MG tablet Take 25 mg by mouth every 6 (six) hours as needed for allergies.    . folic acid (FOLVITE) 1 MG tablet Take 1 tablet (1 mg total) by mouth daily. 30 tablet 11  . ibuprofen (ADVIL,MOTRIN) 800 MG tablet Take 1 tablet (800 mg total) by mouth every 8 (eight) hours as needed. 30 tablet 1  . loratadine (CLARITIN) 10 MG tablet Take 1 tablet (10 mg total) by mouth daily. 30 tablet 6  . promethazine (PHENERGAN) 12.5 MG tablet Take 1 tablet (12.5 mg total) by mouth every 6 (six) hours as needed for nausea or vomiting. 30 tablet 0  .  acetaminophen (TYLENOL) 500 MG tablet Take 1 tablet (500 mg total) by mouth 2 (two) times daily. (Patient not taking: Reported on 01/31/2019) 60 tablet 3   No facility-administered medications prior to visit.     No Known Allergies  ROS Review of Systems  Constitutional: Negative.   HENT: Negative.   Eyes: Negative.   Respiratory: Negative.   Cardiovascular: Negative.   Gastrointestinal: Positive for constipation (occasional ) and nausea (occasional ).  Endocrine: Negative.   Genitourinary: Negative.   Musculoskeletal: Positive for arthralgias (generalized joint pain).  Skin: Negative.   Allergic/Immunologic: Negative.   Neurological: Positive for dizziness (occasional ) and headaches (Occasional ).  Hematological: Negative.   Psychiatric/Behavioral: Negative.       Objective:    Physical Exam  Constitutional: He is oriented to person, place, and time. He appears well-developed and well-nourished.  HENT:  Head: Normocephalic and atraumatic.  Eyes: Conjunctivae are normal.  Neck: Normal range of motion. Neck supple.  Cardiovascular: Normal rate, regular rhythm, normal heart sounds and intact distal pulses.  Pulmonary/Chest: Effort normal and breath sounds normal.  Abdominal: Soft. Bowel sounds are normal.  Musculoskeletal: Normal range of motion.  Neurological: He is alert and oriented to person, place, and time. He has normal reflexes.  Skin: Skin is warm and dry.  Psychiatric: He has a normal mood and affect. His behavior is normal. Judgment and thought content normal.  Nursing note and vitals reviewed.   BP 116/64 (BP Location: Left Arm, Patient Position: Sitting, Cuff Size: Small)   Pulse 84   Temp 98.3 F (36.8 C) (Oral)   Ht 5\' 8"  (1.727 m)   Wt 172 lb (78 kg)   SpO2 99%   BMI 26.15 kg/m  Wt Readings from Last 3 Encounters:  02/13/19 172 lb (78 kg)  01/29/19 171 lb (77.6 kg)  01/23/19 164 lb (74.4 kg)     There are no preventive [name] reminders to display  for this patient.  There are no preventive [name] reminders to display for this patient.  Lab Results  Component Value Date   TSH 0.662 08/12/2017   Lab Results  Component Value Date   WBC 8.1 01/25/2019   HGB 12.4 (L) 01/25/2019   HCT 35.1 (L) 01/25/2019   MCV 79.2 (L) 01/25/2019   PLT 93 (L) 01/25/2019   Lab Results  Component Value Date   NA 140 01/25/2019   K 3.5 01/25/2019   CO2 27 01/25/2019   GLUCOSE 86  01/25/2019   BUN 7 01/25/2019   CREATININE 0.84 01/25/2019   BILITOT 2.2 (H) 01/25/2019   ALKPHOS 46 01/25/2019   AST 10 (L) 01/25/2019   ALT 18 01/25/2019   PROT 7.6 01/25/2019   ALBUMIN 4.3 01/25/2019   CALCIUM 9.2 01/25/2019   ANIONGAP 9 01/25/2019   No results found for: CHOL No results found for: HDL No results found for: LDLCALC No results found for: TRIG No results found for: [facility] Lab Results  Component Value Date   HGBA1C 4.2 08/12/2017   Assessment & Plan:   1. Memory disturbance Mini Memory Assessment Test negative for memory impairments. He will increase fluids, avoid sun during peak sunrise.   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 He will continue to take pain medications as prescribed; will continue to avoid extreme heat and cold; will continue to eat a healthy diet and drink at least 64 ounces of water daily; continue stool softener as needed; will avoid colds and flu; will continue to get plenty of sleep and rest; will continue to avoid high stressful situations and remain infection free; will continue Folic Acid 1 mg daily to avoid sickle cell crisis.   3. Sickle cell anemia without crisis (HCC)  4. Chronic pain syndrome  5. Chronic, continuous use of opioids  6. Nausea and vomiting, intractability of vomiting not specified, unspecified vomiting type - promethazine (PHENERGAN) 12.5 MG tablet; Take 1 tablet (12.5 mg total) by mouth every 6 (six) hours as needed for nausea or vomiting.  Dispense: 30 tablet; Refill: 1   7. Follow up He will keep follow up appointment 03/2019.  Meds ordered this encounter  Medications  . promethazine (PHENERGAN) 12.5 MG tablet    Sig: Take 1 tablet (12.5 mg total) by mouth every 6 (six) hours as needed for nausea or vomiting.    Dispense:  30 tablet    Refill:  1    No orders of the defined types were placed in this encounter.   Referral Orders  No referral(s) requested today    Raliegh IpNatalie Kayveon Lennartz,  MSN, FNP-BC [name] [address]  [place], [place] [phone]   Problem List Items Addressed This Visit      Other   Hemoglobin C-C disease (HCC)    Other Visit Diagnoses    Memory disturbance    -  Primary   Sickle cell anemia without crisis (HCC)       Chronic pain syndrome       Chronic, continuous use of opioids       Nausea and vomiting, intractability of vomiting not specified, unspecified vomiting type       Relevant Medications   promethazine (PHENERGAN) 12.5 MG tablet   Follow up          Meds ordered this encounter  Medications  . promethazine (PHENERGAN) 12.5 MG tablet    Sig: Take 1 tablet (12.5 mg total) by mouth every 6 (six) hours as needed for nausea or vomiting.    Dispense:  30 tablet    Refill:  1    Follow-up: No follow-ups on file.    Kallie LocksNatalie M Alithea Lapage, FNP

## 2019-02-23 ENCOUNTER — Encounter (HOSPITAL_COMMUNITY): Payer: Self-pay | Admitting: Family Medicine

## 2019-03-19 ENCOUNTER — Emergency Department (HOSPITAL_COMMUNITY)
Admission: EM | Admit: 2019-03-19 | Discharge: 2019-03-19 | Disposition: A | Discharge status: Home / Self Care | Payer: 59 | Attending: Emergency Medicine | Admitting: Emergency Medicine

## 2019-03-19 ENCOUNTER — Non-Acute Institutional Stay (HOSPITAL_COMMUNITY): Admission: AD | Admit: 2019-03-19 | Payer: 59 | Source: Ambulatory Visit | Admitting: Internal Medicine

## 2019-03-19 ENCOUNTER — Ambulatory Visit (INDEPENDENT_AMBULATORY_CARE_PROVIDER_SITE_OTHER): Payer: 59 | Admitting: Family Medicine

## 2019-03-19 ENCOUNTER — Encounter (HOSPITAL_COMMUNITY): Payer: Self-pay | Admitting: Emergency Medicine

## 2019-03-19 ENCOUNTER — Other Ambulatory Visit: Payer: Self-pay

## 2019-03-19 VITALS — BP 111/65 | HR 61 | Temp 97.9°F | Ht 68.0 in | Wt 170.4 lb

## 2019-03-19 DIAGNOSIS — D582 Other hemoglobinopathies: Secondary | ICD-10-CM

## 2019-03-19 DIAGNOSIS — D5700 Hb-SS disease with crisis, unspecified: Secondary | ICD-10-CM | POA: Diagnosis not present

## 2019-03-19 DIAGNOSIS — R112 Nausea with vomiting, unspecified: Secondary | ICD-10-CM | POA: Diagnosis not present

## 2019-03-19 DIAGNOSIS — Z79899 Other long term (current) drug therapy: Secondary | ICD-10-CM | POA: Diagnosis not present

## 2019-03-19 DIAGNOSIS — D571 Sickle-cell disease without crisis: Secondary | ICD-10-CM

## 2019-03-19 DIAGNOSIS — G894 Chronic pain syndrome: Secondary | ICD-10-CM | POA: Diagnosis not present

## 2019-03-19 DIAGNOSIS — Z87891 Personal history of nicotine dependence: Secondary | ICD-10-CM | POA: Diagnosis not present

## 2019-03-19 DIAGNOSIS — Z09 Encounter for follow-up examination after completed treatment for conditions other than malignant neoplasm: Secondary | ICD-10-CM | POA: Diagnosis not present

## 2019-03-19 DIAGNOSIS — F1190 Opioid use, unspecified, uncomplicated: Secondary | ICD-10-CM | POA: Diagnosis not present

## 2019-03-19 LAB — CBC WITH DIFFERENTIAL/PLATELET
Abs Immature Granulocytes: 0.03 10*3/uL (ref 0.00–0.07)
Basophils Absolute: 0 10*3/uL (ref 0.0–0.1)
Basophils Relative: 0 %
Eosinophils Absolute: 0.1 10*3/uL (ref 0.0–0.5)
Eosinophils Relative: 1 %
HCT: 35 % — ABNORMAL LOW (ref 39.0–52.0)
Hemoglobin: 12.5 g/dL — ABNORMAL LOW (ref 13.0–17.0)
Immature Granulocytes: 0 %
Lymphocytes Relative: 24 %
Lymphs Abs: 2.9 10*3/uL (ref 0.7–4.0)
MCH: 28.8 pg (ref 26.0–34.0)
MCHC: 35.7 g/dL (ref 30.0–36.0)
MCV: 80.6 fL (ref 80.0–100.0)
Monocytes Absolute: 0.8 10*3/uL (ref 0.1–1.0)
Monocytes Relative: 7 %
Neutro Abs: 8.1 10*3/uL — ABNORMAL HIGH (ref 1.7–7.7)
Neutrophils Relative %: 68 %
Platelets: 126 10*3/uL — ABNORMAL LOW (ref 150–400)
RBC: 4.34 MIL/uL (ref 4.22–5.81)
RDW: 14.1 % (ref 11.5–15.5)
WBC: 11.9 10*3/uL — ABNORMAL HIGH (ref 4.0–10.5)
nRBC: 0.4 % — ABNORMAL HIGH (ref 0.0–0.2)

## 2019-03-19 LAB — COMPREHENSIVE METABOLIC PANEL
ALT: 20 U/L (ref 0–44)
AST: 11 U/L — ABNORMAL LOW (ref 15–41)
Albumin: 4.7 g/dL (ref 3.5–5.0)
Alkaline Phosphatase: 48 U/L (ref 38–126)
Anion gap: 9 (ref 5–15)
BUN: 10 mg/dL (ref 6–20)
CO2: 26 mmol/L (ref 22–32)
Calcium: 9.6 mg/dL (ref 8.9–10.3)
Chloride: 104 mmol/L (ref 98–111)
Creatinine, Ser: 0.88 mg/dL (ref 0.61–1.24)
GFR calc Af Amer: >60 mL/min (ref >60)
GFR calc non Af Amer: >60 mL/min (ref >60)
Glucose, Bld: 89 mg/dL (ref 70–99)
Potassium: 3.8 mmol/L (ref 3.5–5.1)
Sodium: 139 mmol/L (ref 135–145)
Total Bilirubin: 1.7 mg/dL — ABNORMAL HIGH (ref 0.3–1.2)
Total Protein: 7.9 g/dL (ref 6.5–8.1)

## 2019-03-19 LAB — RETICULOCYTES
Immature Retic Fract: 23.8 % — ABNORMAL HIGH (ref 2.3–15.9)
RBC.: 4.34 MIL/uL (ref 4.22–5.81)
Retic Count, Absolute: 185.3 10*3/uL (ref 19.0–186.0)
Retic Ct Pct: 4.3 % — ABNORMAL HIGH (ref 0.4–3.1)

## 2019-03-19 MED ORDER — SODIUM CHLORIDE 0.45 % IV SOLN
INTRAVENOUS | Status: DC
  Administered 2019-03-19: 06:00:00 via INTRAVENOUS

## 2019-03-19 MED ORDER — HYDROMORPHONE HCL 1 MG/ML IJ SOLN: 0.5000 mg | INTRAMUSCULAR | Status: AC

## 2019-03-19 MED ORDER — ONDANSETRON 4 MG PO TBDP
4.0000 mg | ORAL_TABLET | Freq: Once | ORAL | Status: AC
  Administered 2019-03-19: 4 mg via ORAL
  Filled 2019-03-19: qty 1

## 2019-03-19 MED ORDER — HYDROMORPHONE HCL 1 MG/ML IJ SOLN
1.0000 mg | INTRAMUSCULAR | Status: AC
  Administered 2019-03-19: 1 mg via INTRAVENOUS
  Filled 2019-03-19: qty 1

## 2019-03-19 MED ORDER — SODIUM CHLORIDE 0.9% FLUSH
3.0000 mL | Freq: Once | INTRAVENOUS | Status: AC
  Administered 2019-03-19: 3 mL via INTRAVENOUS

## 2019-03-19 MED ORDER — OXYCODONE HCL 10 MG PO TABS: 10.0000 mg | ORAL_TABLET | Freq: Four times a day (QID) | ORAL | 0 refills | Status: DC | PRN

## 2019-03-19 MED ORDER — PROMETHAZINE HCL 12.5 MG PO TABS: 12.5000 mg | ORAL_TABLET | Freq: Four times a day (QID) | ORAL | 1 refills | Status: DC | PRN

## 2019-03-19 MED ORDER — OXYCODONE HCL 10 MG PO TABS: 10.0000 mg | ORAL_TABLET | Freq: Four times a day (QID) | ORAL | 0 refills | Status: AC | PRN

## 2019-03-19 MED ORDER — HYDROMORPHONE HCL 1 MG/ML IJ SOLN: 1.0000 mg | INTRAMUSCULAR | Status: AC

## 2019-03-19 MED ORDER — HYDROMORPHONE HCL 1 MG/ML IJ SOLN
0.5000 mg | Freq: Once | INTRAMUSCULAR | Status: AC
  Administered 2019-03-19: 0.5 mg via SUBCUTANEOUS
  Filled 2019-03-19: qty 1

## 2019-03-19 MED ORDER — PROMETHAZINE HCL 25 MG PO TABS
25.0000 mg | ORAL_TABLET | ORAL | Status: DC | PRN
  Administered 2019-03-19: 25 mg via ORAL
  Filled 2019-03-19: qty 1

## 2019-03-19 MED ORDER — HYDROMORPHONE HCL 1 MG/ML IJ SOLN
0.5000 mg | INTRAMUSCULAR | Status: AC
  Administered 2019-03-19: 0.5 mg via INTRAVENOUS
  Filled 2019-03-19: qty 1

## 2019-03-19 NOTE — ED Provider Notes (Signed)
[facility] DEPT Provider Note   CSN: [id number] Arrival date & time: 03/19/19  0029     History   Chief Complaint Chief Complaint  Patient presents with  . Sickle Cell Pain Crisis    HPI Austin [name] is a 28 y.o. male with a history of eczema and sickle cell anemia who presents to the emergency department with a chief complaint of sickle cell pain crisis.  The patient endorses low back pain that began yesterday evening.  He states the pain is consistent with previous episodes of sickle cell pain crisis.  He reports that he thought he had enough of his home pain medicine to get him through an episode of pain crisis, but reports that he ran out of the medication yesterday.  He states that he takes 15 mg of morphine IR and p.o. Phenergan.  He denies abdominal pain, nausea, vomiting, diarrhea, constipation, chest pain, shortness of breath, fever, chills, dysuria, hematuria, or penile or testicular pain or swelling.     The history is provided by the patient. No language interpreter was used.    Past Medical History:  Diagnosis Date  . Acute maxillary sinusitis   . Eczema   . Sickle cell anemia [facility])     Patient Active Problem List   Diagnosis Date Noted  . Bilious vomiting without nausea   . Viral gastroenteritis 01/23/2019  . Nausea vomiting and diarrhea   . Generalized abdominal pain   . Hb-SS disease with crisis ([place])   . Dehydration   . Sickle cell pain crisis ([place]) 05/18/2018  . Acute sickle cell crisis ([place]) 12/15/2017  . Thrombocytopenia ([place]) 12/15/2017  . Splenomegaly 12/15/2017  . Sickle cell anemia with crisis ([place]) 12/15/2017  . 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 Spleen enlarged 08/18/2017    Past Surgical History:  Procedure Laterality Date  . WISDOM TOOTH EXTRACTION          Home Medications    Prior to Admission medications   Medication Sig Start Date End Date Taking? Authorizing Provider   acetaminophen (TYLENOL) 500 MG tablet Take 1 tablet (500 mg total) by mouth 2 (two) times daily. Patient taking differently: Take 500 mg by mouth every 6 (six) hours as needed for mild pain, moderate pain, fever or headache.  09/04/18  Yes Azzie Glatter, FNP  diphenhydrAMINE (BENADRYL) 25 MG tablet Take 25 mg by mouth every 6 (six) hours as needed for allergies.   Yes [provider]  folic acid (FOLVITE) 1 MG tablet Take 1 tablet (1 mg total) by mouth daily. 01/25/19  Yes Dorena Dew, FNP  ibuprofen (ADVIL,MOTRIN) 800 MG tablet Take 1 tablet (800 mg total) by mouth every 8 (eight) hours as needed. Patient taking differently: Take 800 mg by mouth every 8 (eight) hours as needed for headache, mild pain, moderate pain or cramping.  05/10/18  Yes Azzie Glatter, FNP  loratadine (CLARITIN) 10 MG tablet Take 1 tablet (10 mg total) by mouth daily. 10/03/18  Yes Azzie Glatter, FNP  promethazine (PHENERGAN) 12.5 MG tablet Take 1 tablet (12.5 mg total) by mouth every 6 (six) hours as needed for nausea or vomiting. 02/13/19 03/19/19 Yes Azzie Glatter, FNP    Family History Family History  Problem Relation Age of Onset  . Sickle cell trait Mother   . Diabetes Father   . Hypertension Father     Social History Social History   Tobacco Use  . Smoking status: Former  Smoker    Packs/day: 0.10  . Smokeless tobacco: Never Used  Substance Use Topics  . Alcohol use: Not Currently    Comment: occ  . Drug use: Yes    Types: Marijuana     Allergies   Patient has no known allergies.   Review of Systems Review of Systems  Constitutional: Negative for appetite change, chills and fever.  Respiratory: Negative for shortness of breath.   Cardiovascular: Negative for chest pain.  Gastrointestinal: Negative for abdominal pain, diarrhea, nausea and vomiting.  Genitourinary: Negative for discharge, dysuria, flank pain, frequency, hematuria, penile swelling, scrotal swelling and  testicular pain.  Musculoskeletal: Positive for back pain and myalgias. Negative for arthralgias, gait problem, joint swelling, neck pain and neck stiffness.  Skin: Negative for rash.  Allergic/Immunologic: Negative for immunocompromised state.  Neurological: Negative for weakness, numbness and headaches.  Psychiatric/Behavioral: Negative for confusion.     Physical Exam Updated Vital Signs BP (!) 95/54   Pulse 80   Temp 98.3 F (36.8 C) (Oral)   Resp 15   Ht 5\' 8"  (1.727 m)   Wt 74.8 kg   SpO2 100%   BMI 25.09 kg/m   Physical Exam Vitals signs and nursing note reviewed.  Constitutional:      General: He is not in acute distress.    Appearance: He is well-developed. He is not ill-appearing, toxic-appearing or diaphoretic.  HENT:     Head: Normocephalic.  Eyes:     Conjunctiva/sclera: Conjunctivae normal.  Neck:     Musculoskeletal: Neck supple.  Cardiovascular:     Rate and Rhythm: Normal rate and regular rhythm.     Pulses: Normal pulses.     Heart sounds: No murmur. No friction rub. No gallop.   Pulmonary:     Effort: Pulmonary effort is normal. No respiratory distress.     Breath sounds: No stridor. No wheezing, rhonchi or rales.  Chest:     Chest wall: No tenderness.  Abdominal:     General: There is no distension.     Palpations: Abdomen is soft. There is no mass.     Tenderness: There is no abdominal tenderness. There is no right CVA tenderness, left CVA tenderness, guarding or rebound.     Hernia: No hernia is present.  Musculoskeletal:     Comments: No midline tenderness to the cervical, thoracic, or lumbar spinous processes.  DP and PT pulses are 2+ and symmetric.  5 of 5 strength against resistance of the bilateral lower extremities.  Sensation is intact and equal throughout.  Skin:    General: Skin is warm and dry.  Neurological:     Mental Status: He is alert.  Psychiatric:        Behavior: Behavior normal.    ED Treatments / Results  Labs (all  labs ordered are listed, but only abnormal results are displayed) Labs Reviewed  COMPREHENSIVE METABOLIC PANEL - Abnormal; Notable for the following components:      Result Value   AST 11 (*)    Total Bilirubin 1.7 (*)    All other components within normal limits  CBC WITH DIFFERENTIAL/PLATELET - Abnormal; Notable for the following components:   WBC 11.9 (*)    Hemoglobin 12.5 (*)    HCT 35.0 (*)    Platelets 126 (*)    nRBC 0.4 (*)    Neutro Abs 8.1 (*)    All other components within normal limits  RETICULOCYTES - Abnormal; Notable for the following components:  Retic Ct Pct 4.3 (*)    Immature Retic Fract 23.8 (*)    All other components within normal limits    EKG None  Radiology No results found.  Procedures Procedures (including critical care time)  Medications Ordered in ED Medications  0.45 % sodium chloride infusion ( Intravenous New Bag/Given 03/19/19 0542)  HYDROmorphone (DILAUDID) injection 1 mg (has no administration in time range)    Or  HYDROmorphone (DILAUDID) injection 1 mg (has no administration in time range)  promethazine (PHENERGAN) tablet 25 mg (25 mg Oral Given 03/19/19 0543)  sodium chloride flush (NS) 0.9 % injection 3 mL (3 mLs Intravenous Given 03/19/19 0403)  HYDROmorphone (DILAUDID) injection 0.5 mg (0.5 mg Subcutaneous Given 03/19/19 0403)  ondansetron (ZOFRAN-ODT) disintegrating tablet 4 mg (4 mg Oral Given 03/19/19 0403)  HYDROmorphone (DILAUDID) injection 0.5 mg (0.5 mg Intravenous Given 03/19/19 0624)    Or  HYDROmorphone (DILAUDID) injection 0.5 mg ( Subcutaneous See Alternative 03/19/19 0624)  HYDROmorphone (DILAUDID) injection 1 mg (1 mg Intravenous Given 03/19/19 0542)    Or  HYDROmorphone (DILAUDID) injection 1 mg ( Subcutaneous See Alternative 03/19/19 0542)     Initial Impression / Assessment and Plan / ED Course  I have reviewed the triage vital signs and the nursing notes.  Pertinent labs & imaging results that were available  during my care of the patient were reviewed by me and considered in my medical decision making (see chart for details).        28 year old male with a history of eczema and sickle cell anemia presenting with complaints of sickle cell pain crisis for the last 24 hours.  He states that he ran out of his home pain medication yesterday.  [facility], it appears the patient's last prescription of pain medication was filled for a 15-day supply June 26 for 90 tablets of morphine sulfate IR 15 mg.   He has no neurologic deficits on exam.  Abdominal exam is benign.  Hemoglobin is stable from previous at 12.5.  He has no infectious symptoms.  Doubt acute chest.  Will order Dilaudid x3 and a dose of his home Phenergan and plan to reassess.  Patient care transferred to PA Albrizze at the end of my shift. Patient presentation, ED course, and plan of care discussed with review of all pertinent labs and imaging. Please see his/her note for further details regarding further ED course and disposition.   Final Clinical Impressions(s) / ED Diagnoses   Final diagnoses:  Sickle-cell disease with pain [facility](HCC)    ED Discharge Orders    None       Barkley BoardsMcDonald, Ewa Hipp A, PA-C 03/19/19 0718    Dione BoozeGlick, David, MD 03/19/19 [phone]

## 2019-03-19 NOTE — ED Notes (Signed)
PA at bedside.

## 2019-03-19 NOTE — ED Triage Notes (Signed)
Pt reports sickle cell crisis pain in back that started yesterday evening.

## 2019-03-19 NOTE — Discharge Instructions (Addendum)
You have been seen today for sickle cell pain. Please read and follow all provided instructions. Return to the emergency room for worsening condition or new concerning symptoms.    1. Medications:  We did not refill narcotic prescriptions. You will need to call your primary care doctor this morning to get refills sent in or schedule an appointment to be seen if needed. Continue usual home medications   2. Treatment: rest, drink plenty of fluids  3. Follow Up: Please follow up with your primary doctor today.  ?

## 2019-03-19 NOTE — Progress Notes (Signed)
[facility]   Hospital Follow Up  Subjective:  Patient ID: Austin [name], male    DOB: Nov 26, 1990  Age: 28 y.o. MRN: [id number]  CC:  Chief Complaint  Patient presents with  . Follow-up    discuss pain meds , still having more pain with pain meds    HPI Austin [name] is a 28 year old male who presents for Hospital follow up today.   Past Medical History:  Diagnosis Date  . Acute maxillary sinusitis   . Eczema   . Sickle cell anemia (HCC)    Current Status: Since his last office visit, he has had an ED visit for crisis. Today, he states that his pain is increased and he has pain in his lower back. He rates his pain today at 5/10. He has not had a hospital visit for Sickle Cell Crisis since 03/19/2019 where he was treated and discharged the same day. He is currently taking all medications as prescribed and staying well hydrated. Patient states that Morphine is no longer controlling his pain. He reports occasional nausea, constipation, dizziness and headaches.  He denies fevers, chills, fatigue, recent infections, weight loss, and night sweats. He has not had any visual changes, and falls. No chest pain, heart palpitations, cough and shortness of breath reported. No reports of GI problems such as vomiting, and diarrhea. He has no reports of blood in stools, dysuria and hematuria. His anxiety is moderate today.   Past Surgical History:  Procedure Laterality Date  . WISDOM TOOTH EXTRACTION      Family History  Problem Relation Age of Onset  . Sickle cell trait Mother   . Diabetes Father   . Hypertension Father     Social History   Socioeconomic History  . Marital status: Married    Spouse name: Not on file  . Number of children: Not on file  . Years of education: Not on file  . Highest education level: Not on file  Occupational History  . Not on file  Social Needs  . Financial resource strain: Not on file  . Food  insecurity    Worry: Not on file    Inability: Not on file  . Transportation needs    Medical: Not on file    Non-medical: Not on file  Tobacco Use  . Smoking status: Former Smoker    Packs/day: 0.10  . Smokeless tobacco: Never Used  Substance and Sexual Activity  . Alcohol use: Not Currently    Comment: occ  . Drug use: Yes    Types: Marijuana  . Sexual activity: Not on file  Lifestyle  . Physical activity    Days per week: Not on file    Minutes per session: Not on file  . Stress: Not on file  Relationships  . Social Musicianconnections    Talks on phone: Not on file    Gets together: Not on file    Attends religious service: Not on file    Active member of club or organization: Not on file    Attends meetings of clubs or organizations: Not on file    Relationship status: Not on file  . Intimate partner violence    Fear of current or ex partner: Not on file    Emotionally abused: Not on file    Physically abused: Not on file    Forced sexual activity: Not on file  Other Topics Concern  . Not on file  Social History Narrative  . Not on file    Outpatient Medications Prior to Visit  Medication Sig Dispense Refill  . folic acid (FOLVITE) 1 MG tablet Take 1 tablet (1 mg total) by mouth daily. 30 tablet 11  . loratadine (CLARITIN) 10 MG tablet Take 1 tablet (10 mg total) by mouth daily. 30 tablet 6  . promethazine (PHENERGAN) 12.5 MG tablet Take 1 tablet (12.5 mg total) by mouth every 6 (six) hours as needed for nausea or vomiting. 30 tablet 1  . acetaminophen (TYLENOL) 500 MG tablet Take 1 tablet (500 mg total) by mouth 2 (two) times daily. (Patient not taking: Reported on 03/19/2019) 60 tablet 3  . diphenhydrAMINE (BENADRYL) 25 MG tablet Take 25 mg by mouth every 6 (six) hours as needed for allergies.    Marland Kitchen. ibuprofen (ADVIL,MOTRIN) 800 MG tablet Take 1 tablet (800 mg total) by mouth every 8 (eight) hours as needed. (Patient not taking: Reported on 03/19/2019) 30 tablet 1   No  facility-administered medications prior to visit.     No Known Allergies  ROS Review of Systems  Constitutional: Negative.   HENT: Negative.   Eyes: Negative.   Respiratory: Negative.   Cardiovascular: Negative.   Gastrointestinal: Positive for constipation (occasional ) and nausea (occasional).  Endocrine: Negative.   Genitourinary: Negative.   Musculoskeletal: Positive for arthralgias (generalized joint pain).  Skin: Negative.   Allergic/Immunologic: Negative.   Neurological: Positive for dizziness (occasional ) and headaches (occasional).  Hematological: Negative.   Psychiatric/Behavioral: Negative.       Objective:    Physical Exam  Constitutional: He is oriented to person, place, and time. He appears well-developed and well-nourished.  HENT:  Head: Normocephalic and atraumatic.  Eyes: Conjunctivae are normal.  Neck: Normal range of motion. Neck supple.  Cardiovascular: Normal rate, regular rhythm, normal heart sounds and intact distal pulses.  Pulmonary/Chest: Effort normal and breath sounds normal.  Abdominal: Soft. Bowel sounds are normal.  Musculoskeletal: Normal range of motion.  Neurological: He is alert and oriented to person, place, and time. He has normal reflexes.  Skin: Skin is warm and dry.  Psychiatric: He has a normal mood and affect. His behavior is normal. Judgment and thought content normal.  Nursing note and vitals reviewed.   BP 111/65 (BP Location: Left Arm, Patient Position: Sitting, Cuff Size: Normal)   Pulse 61   Temp 97.9 F (36.6 C) (Oral)   Ht 5\' 8"  (1.727 m)   Wt 170 lb 6.4 oz (77.3 kg)   BMI 25.91 kg/m  Wt Readings from Last 3 Encounters:  03/19/19 170 lb 6.4 oz (77.3 kg)  03/19/19 165 lb (74.8 kg)  02/13/19 172 lb (78 kg)     Health Maintenance Due  Topic Date Due  . INFLUENZA VACCINE  02/24/2019    There are no preventive care reminders to display for this patient.  Lab Results  Component Value Date   TSH 0.662  08/12/2017   Lab Results  Component Value Date   WBC 11.9 (H) 03/19/2019   HGB 12.5 (L) 03/19/2019   HCT 35.0 (L) 03/19/2019   MCV 80.6 03/19/2019   PLT 126 (L) 03/19/2019   Lab Results  Component Value Date   NA 139 03/19/2019   K 3.8 03/19/2019   CO2 26 03/19/2019   GLUCOSE 89 03/19/2019   BUN 10 03/19/2019   CREATININE 0.88 03/19/2019   BILITOT 1.7 (H) 03/19/2019   ALKPHOS 48 03/19/2019   AST 11 (L) 03/19/2019  ALT 20 03/19/2019   PROT 7.9 03/19/2019   ALBUMIN 4.7 03/19/2019   CALCIUM 9.6 03/19/2019   ANIONGAP 9 03/19/2019   No results found for: CHOL No results found for: HDL No results found for: LDLCALC No results found for: TRIG No results found for: [facility] Lab Results  Component Value Date   HGBA1C 4.2 08/12/2017   Assessment & Plan:   1. Hospital discharge follow-up  2. Sickle cell anemia without crisis (HCC)  3. Hemoglobin C-C disease (HCC) Patient reports that Morphine is no longer effective. We will discontinue Morphine and change medication to Oxycodone 10 mg every 6 hours. He will continue to take pain medications as prescribed; will continue to avoid extreme heat and cold; will continue to eat a healthy diet and drink at least 64 ounces of water daily; continue stool softener as needed; will avoid colds and flu; will continue to get plenty of sleep and rest; will continue to avoid high stressful situations and remain infection free; will continue Folic Acid 1 mg daily to avoid sickle cell crisis.  - Oxycodone HCl 10 MG TABS; Take 1 tablet (10 mg total) by mouth every 6 (six) hours as needed for up to 15 days (for pain).  Dispense: 60 tablet; Refill: 0  4. Chronic pain syndrome  5. Chronic, continuous use of opioids  6. Nausea and vomiting, intractability of vomiting not specified, unspecified vomiting type - promethazine (PHENERGAN) 12.5 MG tablet; Take 1 tablet (12.5 mg total) by mouth every 6 (six) hours as needed for nausea or vomiting.   Dispense: 30 tablet; Refill: 1  7. Medication management We will discontinue Morphine today, and initiate Oxycodone 10 mg every 6 hours as needed for pain.   8. Follow up He will follow up in 2 months.   Meds ordered this encounter  Medications  . DISCONTD: Oxycodone HCl 10 MG TABS    Sig: Take 1 tablet (10 mg total) by mouth every 6 (six) hours as needed (for pain).    Dispense:  60 tablet    Refill:  0    Change in regimen! Please discontinue Morphine at this time.    Order Specific Question:   Supervising Provider    Answer:   Quentin AngstJEGEDE, OLUGBEMIGA E [id number]]  . promethazine (PHENERGAN) 12.5 MG tablet    Sig: Take 1 tablet (12.5 mg total) by mouth every 6 (six) hours as needed for nausea or vomiting.    Dispense:  30 tablet    Refill:  1  . Oxycodone HCl 10 MG TABS    Sig: Take 1 tablet (10 mg total) by mouth every 6 (six) hours as needed for up to 15 days (for pain).    Dispense:  60 tablet    Refill:  0    Change in regimen! Please discontinue Morphine at this time.    Order Specific Question:   Supervising Provider    Answer:   Quentin AngstJEGEDE, OLUGBEMIGA E [[id number]]    No orders of the defined types were placed in this encounter.   Referral Orders  No referral(s) requested today    Raliegh IpNatalie Tyshauna Finkbiner,  MSN, FNP-BC [facility] [address]  [place], [place] [address] [phone]   Problem List Items Addressed This Visit      Other   Hemoglobin C-C disease (HCC)   Relevant Medications   Oxycodone HCl 10 MG TABS    Other Visit Diagnoses    Hospital  discharge follow-up    -  Primary   Sickle cell anemia without crisis (HCC)       Chronic pain syndrome       Relevant Medications   Oxycodone HCl 10 MG TABS   Chronic, continuous use of opioids       Nausea and vomiting, intractability of vomiting not specified, unspecified vomiting type       Relevant Medications   promethazine  (PHENERGAN) 12.5 MG tablet   Medication management       Follow up          Meds ordered this encounter  Medications  . DISCONTD: Oxycodone HCl 10 MG TABS    Sig: Take 1 tablet (10 mg total) by mouth every 6 (six) hours as needed (for pain).    Dispense:  60 tablet    Refill:  0    Change in regimen! Please discontinue Morphine at this time.    Order Specific Question:   Supervising Provider    Answer:   Tresa Garter [id number]  . promethazine (PHENERGAN) 12.5 MG tablet    Sig: Take 1 tablet (12.5 mg total) by mouth every 6 (six) hours as needed for nausea or vomiting.    Dispense:  30 tablet    Refill:  1  . Oxycodone HCl 10 MG TABS    Sig: Take 1 tablet (10 mg total) by mouth every 6 (six) hours as needed for up to 15 days (for pain).    Dispense:  60 tablet    Refill:  0    Change in regimen! Please discontinue Morphine at this time.    Order Specific Question:   Supervising Provider    Answer:   Tresa Garter [[id number]]    Follow-up: Return in about 2 months (around 05/19/2019).    Azzie Glatter, FNP

## 2019-03-19 NOTE — ED Provider Notes (Signed)
Care assumed from M. McDonald PA-C.  Please see her full H&P.  In short,  Austin [name] is a 28 y.o. male presents for sickle cell pain crisis x 2 days. He ran out of home pain medications and was unable to control pain. His usual medications are 15 mg morphine IR and PO phenergan. Work up started by previous provider includes unremarkable labs, stable hemoglobin 12.5. Pt has had 2 doses of Dilaudid, plan to reassess after third dose if pain is controlled.  Physical Exam  BP 103/60 (BP Location: Left Arm)   Pulse 63   Temp 98.3 F (36.8 C) (Oral)   Resp 17   Ht 5\' 8"  (1.727 m)   Wt 74.8 kg   SpO2 100%   BMI 25.09 kg/m   Physical Exam  PE: Constitutional: well-developed, well-nourished, no apparent distress HENT: normocephalic, atraumatic. no cervical adenopathy Cardiovascular: normal rate and rhythm, distal pulses intact Pulmonary/Chest: effort normal; breath sounds clear and equal bilaterally; no wheezes or rales Abdominal: soft and nontender Musculoskeletal: full ROM, no edema. No midline tenderness to C, T, L spinous processes. Sensation intact with strength 5/5 in bilateral lower extremities.  Neurological: alert with goal directed thinking Skin: warm and dry, no rash, no diaphoresis Psychiatric: normal mood and affect, normal behavior    ED Course/Procedures    Results for orders placed or performed during the hospital encounter of 03/19/19 (from the past 24 hour(s))  Comprehensive metabolic panel     Status: Abnormal   Collection Time: 03/19/19  4:04 AM  Result Value Ref Range   Sodium 139 135 - 145 mmol/L   Potassium 3.8 3.5 - 5.1 mmol/L   Chloride 104 98 - 111 mmol/L   CO2 26 22 - 32 mmol/L   Glucose, Bld 89 70 - 99 mg/dL   BUN 10 6 - 20 mg/dL   Creatinine, Ser 0.88 0.61 - 1.24 mg/dL   Calcium 9.6 8.9 - 10.3 mg/dL   Total Protein 7.9 6.5 - 8.1 g/dL   Albumin 4.7 3.5 - 5.0 g/dL   AST 11 (L) 15 - 41 U/L   ALT 20 0 - 44 U/L   Alkaline Phosphatase 48 38 - 126  U/L   Total Bilirubin 1.7 (H) 0.3 - 1.2 mg/dL   GFR calc non Af Amer >60 >60 mL/min   GFR calc Af Amer >60 >60 mL/min   Anion gap 9 5 - 15  CBC with Differential     Status: Abnormal   Collection Time: 03/19/19  4:04 AM  Result Value Ref Range   WBC 11.9 (H) 4.0 - 10.5 K/uL   RBC 4.34 4.22 - 5.81 MIL/uL   Hemoglobin 12.5 (L) 13.0 - 17.0 g/dL   HCT 35.0 (L) 39.0 - 52.0 %   MCV 80.6 80.0 - 100.0 fL   MCH 28.8 26.0 - 34.0 pg   MCHC 35.7 30.0 - 36.0 g/dL   RDW 14.1 11.5 - 15.5 %   Platelets 126 (L) 150 - 400 K/uL   nRBC 0.4 (H) 0.0 - 0.2 %   Neutrophils Relative % 68 %   Neutro Abs 8.1 (H) 1.7 - 7.7 K/uL   Lymphocytes Relative 24 %   Lymphs Abs 2.9 0.7 - 4.0 K/uL   Monocytes Relative 7 %   Monocytes Absolute 0.8 0.1 - 1.0 K/uL   Eosinophils Relative 1 %   Eosinophils Absolute 0.1 0.0 - 0.5 K/uL   Basophils Relative 0 %   Basophils Absolute 0.0 0.0 - 0.1  K/uL   Immature Granulocytes 0 %   Abs Immature Granulocytes 0.03 0.00 - 0.07 K/uL  Reticulocytes     Status: Abnormal   Collection Time: 03/19/19  4:04 AM  Result Value Ref Range   Retic Ct Pct 4.3 (H) 0.4 - 3.1 %   RBC. 4.34 4.22 - 5.81 MIL/uL   Retic Count, Absolute 185.3 19.0 - 186.0 K/uL   Immature Retic Fract 23.8 (H) 2.3 - 15.9 %     MDM   Pt received in sign out. Labs are stable. He is afebrile, nontoxic in appearance. Pain is consistent with his usual sickle cell pain.  Pain received 3 doses of dilaudid and home dose of phenergan. On reassessment pt was sleeping comfortably. He feels pain has improved and he is agreeable to discharge. He needs to follow up with pcp for morphine refill. He knows to call today to get refill.  The patient appears reasonably screened and/or stabilized for discharge and I doubt any other medical condition or other [facility] requiring further screening, evaluation, or treatment in the ED at this time prior to discharge. The patient is safe for discharge with strict return precautions discussed.    This note was prepared using Dragon voice recognition software and may include unintentional dictation errors due to the inherent limitations of voice recognition software.         Kathyrn Lasslbrizze, Lorenzo Pereyra E, PA-C 03/19/19 [id number]    Raeford RazorKohut, Stephen, MD 03/19/19 1135

## 2019-03-20 DIAGNOSIS — Z79899 Other long term (current) drug therapy: Secondary | ICD-10-CM | POA: Insufficient documentation

## 2019-04-03 ENCOUNTER — Ambulatory Visit: Payer: 59 | Admitting: Family Medicine

## 2019-04-11 ENCOUNTER — Other Ambulatory Visit: Payer: Self-pay

## 2019-04-11 ENCOUNTER — Other Ambulatory Visit: Payer: Self-pay | Admitting: Family Medicine

## 2019-04-11 ENCOUNTER — Telehealth: Payer: Self-pay | Admitting: Family Medicine

## 2019-04-11 DIAGNOSIS — F1190 Opioid use, unspecified, uncomplicated: Secondary | ICD-10-CM

## 2019-04-11 DIAGNOSIS — D571 Sickle-cell disease without crisis: Secondary | ICD-10-CM

## 2019-04-11 DIAGNOSIS — G894 Chronic pain syndrome: Secondary | ICD-10-CM

## 2019-04-11 MED ORDER — OXYCODONE-ACETAMINOPHEN 10-325 MG PO TABS: 1.0000 | ORAL_TABLET | Freq: Four times a day (QID) | ORAL | 0 refills | Status: DC | PRN

## 2019-04-11 NOTE — Telephone Encounter (Signed)
Pt is requesting refill on oxycodone the last date this was filled was on 04/03/19.

## 2019-04-14 ENCOUNTER — Emergency Department (HOSPITAL_COMMUNITY)
Admission: EM | Admit: 2019-04-14 | Discharge: 2019-04-15 | Disposition: A | Discharge status: Home / Self Care | Payer: 59 | Attending: Emergency Medicine | Admitting: Emergency Medicine

## 2019-04-14 ENCOUNTER — Other Ambulatory Visit: Payer: Self-pay

## 2019-04-14 ENCOUNTER — Encounter (HOSPITAL_COMMUNITY): Payer: Self-pay

## 2019-04-14 DIAGNOSIS — D5700 Hb-SS disease with crisis, unspecified: Secondary | ICD-10-CM | POA: Insufficient documentation

## 2019-04-14 DIAGNOSIS — Z79899 Other long term (current) drug therapy: Secondary | ICD-10-CM | POA: Diagnosis not present

## 2019-04-14 DIAGNOSIS — Z87891 Personal history of nicotine dependence: Secondary | ICD-10-CM | POA: Insufficient documentation

## 2019-04-14 LAB — COMPREHENSIVE METABOLIC PANEL
ALT: 18 U/L (ref 0–44)
AST: 14 U/L — ABNORMAL LOW (ref 15–41)
Albumin: 5.5 g/dL — ABNORMAL HIGH (ref 3.5–5.0)
Alkaline Phosphatase: 56 U/L (ref 38–126)
Anion gap: 13 (ref 5–15)
BUN: 10 mg/dL (ref 6–20)
CO2: 24 mmol/L (ref 22–32)
Calcium: 9.9 mg/dL (ref 8.9–10.3)
Chloride: 102 mmol/L (ref 98–111)
Creatinine, Ser: 1.03 mg/dL (ref 0.61–1.24)
GFR calc Af Amer: >60 mL/min (ref >60)
GFR calc non Af Amer: >60 mL/min (ref >60)
Glucose, Bld: 112 mg/dL — ABNORMAL HIGH (ref 70–99)
Potassium: 3.8 mmol/L (ref 3.5–5.1)
Sodium: 139 mmol/L (ref 135–145)
Total Bilirubin: 1.6 mg/dL — ABNORMAL HIGH (ref 0.3–1.2)
Total Protein: 8.8 g/dL — ABNORMAL HIGH (ref 6.5–8.1)

## 2019-04-14 LAB — CBC WITH DIFFERENTIAL/PLATELET
Abs Immature Granulocytes: 0.04 10*3/uL (ref 0.00–0.07)
Basophils Absolute: 0 10*3/uL (ref 0.0–0.1)
Basophils Relative: 0 %
Eosinophils Absolute: 0.1 10*3/uL (ref 0.0–0.5)
Eosinophils Relative: 1 %
HCT: 40 % (ref 39.0–52.0)
Hemoglobin: 14 g/dL (ref 13.0–17.0)
Immature Granulocytes: 0 %
Lymphocytes Relative: 26 %
Lymphs Abs: 3 10*3/uL (ref 0.7–4.0)
MCH: 27.9 pg (ref 26.0–34.0)
MCHC: 35 g/dL (ref 30.0–36.0)
MCV: 79.8 fL — ABNORMAL LOW (ref 80.0–100.0)
Monocytes Absolute: 0.7 10*3/uL (ref 0.1–1.0)
Monocytes Relative: 6 %
Neutro Abs: 7.6 10*3/uL (ref 1.7–7.7)
Neutrophils Relative %: 67 %
Platelets: 114 10*3/uL — ABNORMAL LOW (ref 150–400)
RBC: 5.01 MIL/uL (ref 4.22–5.81)
RDW: 13.7 % (ref 11.5–15.5)
WBC: 11.5 10*3/uL — ABNORMAL HIGH (ref 4.0–10.5)
nRBC: 0.3 % — ABNORMAL HIGH (ref 0.0–0.2)

## 2019-04-14 LAB — RETICULOCYTES
Immature Retic Fract: 19.1 % — ABNORMAL HIGH (ref 2.3–15.9)
RBC.: 5.01 MIL/uL (ref 4.22–5.81)
Retic Count, Absolute: 166.8 10*3/uL (ref 19.0–186.0)
Retic Ct Pct: 3.3 % — ABNORMAL HIGH (ref 0.4–3.1)

## 2019-04-14 MED ORDER — ONDANSETRON HCL 4 MG/2ML IJ SOLN
4.0000 mg | Freq: Once | INTRAMUSCULAR | Status: AC | PRN
  Administered 2019-04-14: 4 mg via INTRAVENOUS
  Filled 2019-04-14: qty 2

## 2019-04-14 MED ORDER — SODIUM CHLORIDE 0.9% FLUSH
3.0000 mL | Freq: Once | INTRAVENOUS | Status: AC
  Administered 2019-04-14: 3 mL via INTRAVENOUS

## 2019-04-14 MED ORDER — HYDROMORPHONE HCL 1 MG/ML IJ SOLN
1.0000 mg | Freq: Once | INTRAMUSCULAR | Status: AC
  Administered 2019-04-14: 1 mg via INTRAVENOUS
  Filled 2019-04-14: qty 1

## 2019-04-14 NOTE — ED Notes (Signed)
Pt vomiting in triage 

## 2019-04-14 NOTE — ED Triage Notes (Signed)
Pt reports sickle cell pain that started about 1 hour ago. He also endorses nausea. States that he pain is mostly in his back. Denies SOB or chest pain.

## 2019-04-15 DIAGNOSIS — Z79899 Other long term (current) drug therapy: Secondary | ICD-10-CM | POA: Diagnosis not present

## 2019-04-15 DIAGNOSIS — Z87891 Personal history of nicotine dependence: Secondary | ICD-10-CM | POA: Diagnosis not present

## 2019-04-15 DIAGNOSIS — D5700 Hb-SS disease with crisis, unspecified: Secondary | ICD-10-CM | POA: Diagnosis not present

## 2019-04-15 MED ORDER — HYDROMORPHONE HCL 1 MG/ML IJ SOLN: 1.0000 mg | INTRAMUSCULAR | Status: DC

## 2019-04-15 MED ORDER — SODIUM CHLORIDE 0.45 % IV SOLN
Freq: Once | INTRAVENOUS | Status: AC
  Administered 2019-04-15: 01:00:00 via INTRAVENOUS

## 2019-04-15 MED ORDER — KETOROLAC TROMETHAMINE 30 MG/ML IJ SOLN
30.0000 mg | INTRAMUSCULAR | Status: AC
  Administered 2019-04-15: 30 mg via INTRAVENOUS
  Filled 2019-04-15: qty 1

## 2019-04-15 MED ORDER — HYDROMORPHONE HCL 1 MG/ML IJ SOLN: 1.0000 mg | INTRAMUSCULAR | Status: AC

## 2019-04-15 MED ORDER — HYDROMORPHONE HCL 1 MG/ML IJ SOLN
1.0000 mg | INTRAMUSCULAR | Status: AC
  Administered 2019-04-15: 1 mg via INTRAVENOUS
  Filled 2019-04-15: qty 1

## 2019-04-15 MED ORDER — PROMETHAZINE HCL 25 MG PO TABS
25.0000 mg | ORAL_TABLET | ORAL | Status: DC | PRN
  Administered 2019-04-15: 25 mg via ORAL
  Filled 2019-04-15: qty 1

## 2019-04-15 NOTE — Discharge Instructions (Signed)
Please follow up with your doctor Monday for recheck if having any symptoms of concern. If pain becomes severe, you develop a fever, uncontrolled vomiting or new concern, please return to the emergency department.

## 2019-04-15 NOTE — ED Notes (Signed)
Patient educated about not driving or performing other critical tasks (such as operating heavy machinery, caring for infant/toddler/child) due to sedative nature of narcotic medications received while in the ED.  Pt/caregiver verbalized understanding. Reports his wife will be enroute to pick him up

## 2019-04-15 NOTE — ED Provider Notes (Signed)
[facility]-EMERGENCY DEPT Provider Note   CSN: [id number] Arrival date & time: 04/14/19  2148     History   Chief Complaint Chief Complaint  Patient presents with   Sickle Cell Pain Crisis    HPI Austin [name] is a 28 y.o. male.     Patient to ED with complaint of low back pain like previous sickle cell crises. Pain started tonight about 1 hour prior to arrival in the ED. No fever, cough, chest pain, congestion, abdominal pain or urinary symptoms. He reports a recent change to his medication regimen about one month ago from morphine to oxycodone. He has been doing well with the change.   The history is provided by the patient. No language interpreter was used.    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 Authorizing Provider  acetaminophen (TYLENOL) 500 MG tablet Take 1 tablet (500 mg total) by mouth 2 (two) times daily. Patient not taking: Reported on 03/19/2019 09/04/18   Kallie LocksStroud, Natalie M, FNP  diphenhydrAMINE (BENADRYL) 25 MG tablet Take 25 mg by mouth every 6 (six) hours as needed for allergies.    [provider]  folic  acid (FOLVITE) 1 MG tablet Take 1 tablet (1 mg total) by mouth daily. 01/25/19   Massie MaroonHollis, Lachina M, FNP  ibuprofen (ADVIL,MOTRIN) 800 MG tablet Take 1 tablet (800 mg total) by mouth every 8 (eight) hours as needed. Patient not taking: Reported on 03/19/2019 05/10/18   Kallie LocksStroud, Natalie M, FNP  loratadine (CLARITIN) 10 MG tablet Take 1 tablet (10 mg total) by mouth daily. 10/03/18   Kallie LocksStroud, Natalie M, FNP  oxyCODONE-acetaminophen (PERCOCET) 10-325 MG tablet Take 1 tablet by mouth every 6 (six) hours as needed for up to 15 days for pain. 04/11/19 04/26/19  Kallie LocksStroud, Natalie M, FNP  promethazine (PHENERGAN) 12.5 MG tablet Take 1 tablet (12.5 mg total) by mouth every 6 (six) hours as needed for nausea or vomiting. 03/19/19 04/18/19  Kallie LocksStroud, Natalie M, FNP    Family History Family History  Problem Relation Age of Onset   Sickle cell trait Mother    Diabetes Father    Hypertension Father     Social History Social History   Tobacco Use   Smoking status: Former Smoker    Packs/day: 0.10   Smokeless tobacco: Never Used  Substance Use Topics   Alcohol use: Not Currently    Comment: occ   Drug use: Yes    Types: Marijuana     Allergies   Patient has no known allergies.   Review of Systems Review of  Systems  Constitutional: Negative for chills and fever.  HENT: Negative.  Negative for congestion.   Respiratory: Negative.  Negative for cough and shortness of breath.   Cardiovascular: Negative.  Negative for chest pain.  Gastrointestinal: Positive for nausea. Negative for abdominal pain and vomiting.  Genitourinary: Negative for dysuria, flank pain and scrotal swelling.  Musculoskeletal: Positive for back pain.  Skin: Negative.   Neurological: Negative.  Negative for light-headedness.     Physical Exam Updated Vital Signs BP (!) 133/98 (BP Location: Right Arm)    Pulse 73    Temp 98 F (36.7 C) (Oral)    Resp 14    Ht 5\' 7"  (1.702 m)    Wt 77.1 kg    SpO2 100%    BMI 26.63 kg/m    Physical Exam Vitals signs and nursing note reviewed.  Constitutional:      General: He is not in acute distress.    Appearance: He is well-developed.  HENT:     Head: Normocephalic and atraumatic.  Neck:     Musculoskeletal: Normal range of motion and neck supple.  Cardiovascular:     Rate and Rhythm: Normal rate and regular rhythm.     Heart sounds: No murmur.  Pulmonary:     Effort: Pulmonary effort is normal.     Breath sounds: Normal breath sounds. No wheezing, rhonchi or rales.  Abdominal:     General: Bowel sounds are normal.     Palpations: Abdomen is soft.     Tenderness: There is no abdominal tenderness. There is no guarding or rebound.  Musculoskeletal: Normal range of motion.     Lumbar back: He exhibits tenderness.       Back:  Skin:    General: Skin is warm and dry.     Findings: No rash.  Neurological:     Mental Status: He is alert and oriented to person, place, and time.      ED Treatments / Results  Labs (all labs ordered are listed, but only abnormal results are displayed) Labs Reviewed  COMPREHENSIVE METABOLIC PANEL - Abnormal; Notable for the following components:      Result Value   Glucose, Bld 112 (*)    Total Protein 8.8 (*)    Albumin 5.5 (*)    AST 14 (*)    Total Bilirubin 1.6 (*)    All other components within normal limits  CBC WITH DIFFERENTIAL/PLATELET - Abnormal; Notable for the following components:   WBC 11.5 (*)    MCV 79.8 (*)    Platelets 114 (*)    nRBC 0.3 (*)    All other components within normal limits  RETICULOCYTES - Abnormal; Notable for the following components:   Retic Ct Pct 3.3 (*)    Immature Retic Fract 19.1 (*)    All other components within normal limits    EKG None  Radiology No results found.  Procedures Procedures (including critical care time)  Medications Ordered in ED Medications  HYDROmorphone (DILAUDID) injection 1 mg (has no administration in time range)    Or  HYDROmorphone  (DILAUDID) injection 1 mg (has no administration in time range)  HYDROmorphone (DILAUDID) injection 1 mg (has no administration in time range)    Or  HYDROmorphone (DILAUDID) injection 1 mg (has no administration in time range)  promethazine (PHENERGAN) tablet 25 mg (has no administration in time range)  ketorolac (TORADOL) 30 MG/ML injection 30 mg (has no administration in time range)  sodium chloride flush (NS)  0.9 % injection 3 mL (3 mLs Intravenous Given 04/14/19 2315)  ondansetron (ZOFRAN) injection 4 mg (4 mg Intravenous Given 04/14/19 2315)  HYDROmorphone (DILAUDID) injection 1 mg (1 mg Intravenous Given 04/14/19 2326)     Initial Impression / Assessment and Plan / ED Course  I have reviewed the triage vital signs and the nursing notes.  Pertinent labs & imaging results that were available during my care of the patient were reviewed by me and considered in my medical decision making (see chart for details).        Patient with history of sickle cell anemia presents with onset low back pain tonight similar to previous pain crises. No fever, urinary symptoms, vomiting, SoB, chest pain or tightness. No history of acute chest syndrome.   Chart reviewed. The patient is an infrequent visitor to the emergency department and seems well managed on chronic medications. VS normal tonight, no fever. No cough or chest pain to cause suspicion for acute chest.   Sickle cell pain treatment protocol ordered. Will give 1 mg Dilaudid increments as he reports this is usually all he needs for adequate pain control.   Patient has received Dilaudid dosing. He is given IVF's (1/2 NS). He reports he is ready to be discharged home and will follow up with his doctor as needed.   Final Clinical Impressions(s) / ED Diagnoses   Final diagnoses:  None   1. Sickle cell anemia with pain  ED Discharge Orders    None       Elpidio Anis, PA-C 04/16/19 3536    Ward, Layla Maw, DO 04/16/19 0725

## 2019-04-25 ENCOUNTER — Other Ambulatory Visit: Payer: Self-pay

## 2019-04-25 ENCOUNTER — Emergency Department (HOSPITAL_COMMUNITY)
Admission: EM | Admit: 2019-04-25 | Discharge: 2019-04-25 | Disposition: A | Discharge status: Home / Self Care | Payer: 59 | Attending: Emergency Medicine | Admitting: Emergency Medicine

## 2019-04-25 DIAGNOSIS — R110 Nausea: Secondary | ICD-10-CM | POA: Diagnosis not present

## 2019-04-25 DIAGNOSIS — D5700 Hb-SS disease with crisis, unspecified: Secondary | ICD-10-CM | POA: Diagnosis not present

## 2019-04-25 DIAGNOSIS — Z87891 Personal history of nicotine dependence: Secondary | ICD-10-CM | POA: Insufficient documentation

## 2019-04-25 DIAGNOSIS — Z79899 Other long term (current) drug therapy: Secondary | ICD-10-CM | POA: Insufficient documentation

## 2019-04-25 DIAGNOSIS — R1111 Vomiting without nausea: Secondary | ICD-10-CM | POA: Diagnosis not present

## 2019-04-25 DIAGNOSIS — R5381 Other malaise: Secondary | ICD-10-CM | POA: Diagnosis not present

## 2019-04-25 DIAGNOSIS — M545 Low back pain: Secondary | ICD-10-CM | POA: Diagnosis present

## 2019-04-25 DIAGNOSIS — R112 Nausea with vomiting, unspecified: Secondary | ICD-10-CM | POA: Diagnosis not present

## 2019-04-25 LAB — COMPREHENSIVE METABOLIC PANEL
ALT: 26 U/L (ref 0–44)
AST: 14 U/L — ABNORMAL LOW (ref 15–41)
Albumin: 5.3 g/dL — ABNORMAL HIGH (ref 3.5–5.0)
Alkaline Phosphatase: 55 U/L (ref 38–126)
Anion gap: 15 (ref 5–15)
BUN: 12 mg/dL (ref 6–20)
CO2: 18 mmol/L — ABNORMAL LOW (ref 22–32)
Calcium: 10 mg/dL (ref 8.9–10.3)
Chloride: 104 mmol/L (ref 98–111)
Creatinine, Ser: 0.8 mg/dL (ref 0.61–1.24)
GFR calc Af Amer: >60 mL/min (ref >60)
GFR calc non Af Amer: >60 mL/min (ref >60)
Glucose, Bld: 129 mg/dL — ABNORMAL HIGH (ref 70–99)
Potassium: 3.8 mmol/L (ref 3.5–5.1)
Sodium: 137 mmol/L (ref 135–145)
Total Bilirubin: 2.1 mg/dL — ABNORMAL HIGH (ref 0.3–1.2)
Total Protein: 8.7 g/dL — ABNORMAL HIGH (ref 6.5–8.1)

## 2019-04-25 LAB — CBC WITH DIFFERENTIAL/PLATELET
Abs Immature Granulocytes: 0.06 10*3/uL (ref 0.00–0.07)
Basophils Absolute: 0 10*3/uL (ref 0.0–0.1)
Basophils Relative: 0 %
Eosinophils Absolute: 0 10*3/uL (ref 0.0–0.5)
Eosinophils Relative: 0 %
HCT: 37.6 % — ABNORMAL LOW (ref 39.0–52.0)
Hemoglobin: 13.1 g/dL (ref 13.0–17.0)
Immature Granulocytes: 0 %
Lymphocytes Relative: 7 %
Lymphs Abs: 1.1 10*3/uL (ref 0.7–4.0)
MCH: 27.9 pg (ref 26.0–34.0)
MCHC: 34.8 g/dL (ref 30.0–36.0)
MCV: 80 fL (ref 80.0–100.0)
Monocytes Absolute: 0.8 10*3/uL (ref 0.1–1.0)
Monocytes Relative: 5 %
Neutro Abs: 14 10*3/uL — ABNORMAL HIGH (ref 1.7–7.7)
Neutrophils Relative %: 88 %
Platelets: 110 10*3/uL — ABNORMAL LOW (ref 150–400)
RBC: 4.7 MIL/uL (ref 4.22–5.81)
RDW: 13.9 % (ref 11.5–15.5)
WBC: 15.9 10*3/uL — ABNORMAL HIGH (ref 4.0–10.5)
nRBC: 0.3 % — ABNORMAL HIGH (ref 0.0–0.2)

## 2019-04-25 LAB — RETICULOCYTES
Immature Retic Fract: 32.9 % — ABNORMAL HIGH (ref 2.3–15.9)
RBC.: 4.7 MIL/uL (ref 4.22–5.81)
Retic Count, Absolute: 179.1 10*3/uL (ref 19.0–186.0)
Retic Ct Pct: 3.8 % — ABNORMAL HIGH (ref 0.4–3.1)

## 2019-04-25 MED ORDER — SODIUM CHLORIDE 0.9 % IV BOLUS
500.0000 mL | Freq: Once | INTRAVENOUS | Status: AC
  Administered 2019-04-25: 500 mL via INTRAVENOUS

## 2019-04-25 MED ORDER — SODIUM CHLORIDE 0.9 % IV SOLN: INTRAVENOUS | Status: DC

## 2019-04-25 MED ORDER — ONDANSETRON HCL 4 MG/2ML IJ SOLN
4.0000 mg | Freq: Once | INTRAMUSCULAR | Status: AC
  Administered 2019-04-25: 4 mg via INTRAVENOUS
  Filled 2019-04-25: qty 2

## 2019-04-25 MED ORDER — HYDROMORPHONE HCL 1 MG/ML IJ SOLN
1.0000 mg | INTRAMUSCULAR | Status: AC | PRN
  Administered 2019-04-25 (×4): 1 mg via INTRAVENOUS
  Filled 2019-04-25 (×5): qty 1

## 2019-04-25 NOTE — ED Triage Notes (Signed)
Per EMs- patient c/o sickle cell pain and emesis since noon today. Patient was given Zofran 4 mg prior to arrival to the ED.

## 2019-04-25 NOTE — ED Notes (Signed)
Patient actively vomiting in triage. Did receive 4mg  zofran en route to ED via EMS.

## 2019-04-25 NOTE — ED Notes (Signed)
Pt PO was able to drink a ginger ale with no N/V. Pt states his pain in is 4/10.

## 2019-04-25 NOTE — ED Notes (Signed)
PT REQUESTING WATER. HE ENDORSES HE DOES NOT FEEL NAUSEATED AND HAS NOT VOMITED " IN A VERY LONG TIME." GIVE A FEW ICE CHIPS AND INSTRUCTIONS ORAL INTAKE. PT VERBALIZED UNDERSTANDING AND DEMONSTRATES WELL.

## 2019-04-25 NOTE — ED Notes (Signed)
TRIAGE RN [place] MADE AWARE PT IS READY TO DRAW BLOOD AND REQUESTING PAIN MED SQ. PT MADE AWARE OF BED ASSIGNMENT AND ACUITY STATUS. APOLOGIZED FOR HIS WAITING TIME. AWARE OF PLAN OF CARE.

## 2019-04-25 NOTE — ED Notes (Signed)
NO ACTIVE VOMITING AT PRESENT

## 2019-04-25 NOTE — ED Provider Notes (Signed)
[facility]-EMERGENCY DEPT Provider Note   CSN: [id number] Arrival date & time: 04/25/19  1613     History   Chief Complaint Chief Complaint  Patient presents with  . Emesis  . Sickle Cell Pain Crisis    HPI Austin [name] is a 28 y.o. male.     HPI   He states he began vomiting today while at work, because of presumed "bad food."  He feels like the vomiting triggered his sickle cell crisis which is low back pain, today.  Recently in the ED for similar pain.  He denies fever, chills, blood in emesis, cough, weakness or dizziness.  There are no other known modifying factors.  Past Medical History:  Diagnosis Date  . Acute maxillary sinusitis   . Eczema   . Sickle cell anemia [facility])     Patient Active Problem List   Diagnosis Date Noted  . Medication management 03/20/2019  . Bilious vomiting without nausea   . Viral gastroenteritis 01/23/2019  . Nausea vomiting and diarrhea   . Generalized abdominal pain   . Hb-SS disease with crisis (HCC)   . Dehydration   . Sickle cell pain crisis (HCC) 05/18/2018  . Acute sickle cell crisis (HCC) 12/15/2017  . Thrombocytopenia (HCC) 12/15/2017  . Splenomegaly 12/15/2017  . Sickle cell anemia with crisis (HCC) 12/15/2017  . 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 Spleen enlarged 08/18/2017    Past Surgical History:  Procedure Laterality Date  . WISDOM TOOTH EXTRACTION          Home Medications    Prior to Admission medications   Medication Sig Start Date End Date Taking? Authorizing Provider  folic acid (FOLVITE) 1 MG tablet Take 1 tablet (1 mg total) by mouth daily. 01/25/19  Yes Massie Maroon, FNP  oxyCODONE-acetaminophen (PERCOCET) 10-325 MG tablet Take 1 tablet by mouth every 6 (six) hours as needed for up to 15 days for pain. 04/11/19 04/26/19 Yes Kallie Locks, FNP  promethazine (PHENERGAN) 12.5 MG tablet Take 1 tablet (12.5 mg total) by mouth every 6 (six) hours as needed for nausea  or vomiting. 03/19/19 04/25/19 Yes Kallie Locks, FNP  acetaminophen (TYLENOL) 500 MG tablet Take 1 tablet (500 mg total) by mouth 2 (two) times daily. Patient not taking: Reported on 03/19/2019 09/04/18   Kallie Locks, FNP  ibuprofen (ADVIL,MOTRIN) 800 MG tablet Take 1 tablet (800 mg total) by mouth every 8 (eight) hours as needed. Patient not taking: Reported on 03/19/2019 05/10/18   Kallie Locks, FNP  loratadine (CLARITIN) 10 MG tablet Take 1 tablet (10 mg total) by mouth daily. Patient not taking: Reported on 04/25/2019 10/03/18   Kallie Locks, FNP    Family History Family History  Problem Relation Age of Onset  . Sickle cell trait Mother   . Diabetes Father   . Hypertension Father     Social History Social History   Tobacco Use  . Smoking status: Former Smoker    Packs/day: 0.10  . Smokeless tobacco: Never Used  Substance Use Topics  . Alcohol use: Not Currently    Comment: occ  . Drug use: Yes    Types: Marijuana     Allergies   Patient has no known allergies.   Review of Systems Review of Systems  All other systems reviewed and are negative.    Physical Exam Updated Vital Signs BP 117/76 (BP Location: Right Arm)   Pulse 96   Temp 98.4 F (  36.9 C) (Oral)   Resp 16   Ht 5\' 7"  (1.702 m)   Wt 77.1 kg   SpO2 99%   BMI 26.63 kg/m   Physical Exam Vitals signs and nursing note reviewed.  Constitutional:      General: He is not in acute distress.    Appearance: He is well-developed. He is not ill-appearing, toxic-appearing or diaphoretic.  HENT:     Head: Normocephalic and atraumatic.     Right Ear: External ear normal.     Left Ear: External ear normal.  Eyes:     Conjunctiva/sclera: Conjunctivae normal.     Pupils: Pupils are equal, round, and reactive to light.  Neck:     Musculoskeletal: Normal range of motion and neck supple.     Trachea: Phonation normal.  Cardiovascular:     Rate and Rhythm: Normal rate and regular rhythm.      Heart sounds: Normal heart sounds.  Pulmonary:     Effort: Pulmonary effort is normal.     Breath sounds: Normal breath sounds.  Musculoskeletal: Normal range of motion.  Skin:    General: Skin is warm and dry.  Neurological:     Mental Status: He is alert and oriented to person, place, and time.     Cranial Nerves: No cranial nerve deficit.     Sensory: No sensory deficit.     Motor: No abnormal muscle tone.     Coordination: Coordination normal.  Psychiatric:        Mood and Affect: Mood normal.        Behavior: Behavior normal.        Thought Content: Thought content normal.        Judgment: Judgment normal.      ED Treatments / Results  Labs (all labs ordered are listed, but only abnormal results are displayed) Labs Reviewed  COMPREHENSIVE METABOLIC PANEL - Abnormal; Notable for the following components:      Result Value   CO2 18 (*)    Glucose, Bld 129 (*)    Total Protein 8.7 (*)    Albumin 5.3 (*)    AST 14 (*)    Total Bilirubin 2.1 (*)    All other components within normal limits  CBC WITH DIFFERENTIAL/PLATELET - Abnormal; Notable for the following components:   WBC 15.9 (*)    HCT 37.6 (*)    Platelets 110 (*)    nRBC 0.3 (*)    Neutro Abs 14.0 (*)    All other components within normal limits  RETICULOCYTES - Abnormal; Notable for the following components:   Retic Ct Pct 3.8 (*)    Immature Retic Fract 32.9 (*)    All other components within normal limits    EKG None  Radiology No results found.  Procedures .Critical Care Performed by: Daleen Bo, MD Authorized by: Daleen Bo, MD   Critical care provider statement:    Critical care time (minutes):  35   Critical care start time:  04/25/2019 7:30 PM   Critical care end time:  04/25/2019 11:10 PM   Critical care time was exclusive of:  Separately billable procedures and treating other patients   Critical care was necessary to treat or prevent imminent or life-threatening deterioration of  the following conditions:  Circulatory failure   Critical care was time spent personally by me on the following activities:  Blood draw for specimens, development of treatment plan with patient or surrogate, discussions with consultants, evaluation of patient's response to  treatment, examination of patient, obtaining history from patient or surrogate, ordering and performing treatments and interventions, ordering and review of laboratory studies, pulse oximetry, re-evaluation of patient's condition, review of old charts and ordering and review of radiographic studies   (including critical care time)  Medications Ordered in ED Medications  sodium chloride 0.9 % bolus 500 mL (0 mLs Intravenous Stopped 04/25/19 2145)  HYDROmorphone (DILAUDID) injection 1 mg (1 mg Intravenous Given 04/25/19 2308)  ondansetron (ZOFRAN) injection 4 mg (4 mg Intravenous Given 04/25/19 1952)  ondansetron (ZOFRAN) injection 4 mg (4 mg Intravenous Given 04/25/19 2145)     Initial Impression / Assessment and Plan / ED Course  I have reviewed the triage vital signs and the nursing notes.  Pertinent labs & imaging results that were available during my care of the patient were reviewed by me and considered in my medical decision making (see chart for details).  Clinical Course as of Apr 26 1019  Thu Apr 26, 2019  1019 Comprehensive metabolic panel(!) [EW]  1019 Except white count high, hematocrit low, platelets low, neutrophils high  CBC with Differential(!) [EW]  1019 Mild elevation  Reticulocytes(!) [EW]    Clinical Course User Index [EW] Mancel BaleWentz, Denarius Sesler, MD        Patient Vitals for the past 24 hrs:  BP Temp Temp src Pulse Resp SpO2 Height Weight  04/25/19 2310 117/76 98.4 F (36.9 C) Oral 96 16 99 % - -  04/25/19 2216 (!) 120/91 - - 95 16 100 % - -  04/25/19 2130 114/72 - - 88 14 98 % - -  04/25/19 2100 133/88 - - 76 14 99 % - -  04/25/19 2054 131/77 - - 98 16 99 % - -  04/25/19 1918 124/69 (!) 97.5 F  (36.4 C) Oral 69 18 100 % - -  04/25/19 1917 124/69 (!) 97.5 F (36.4 C) Oral 73 - 100 % - -  04/25/19 1635 (!) 128/92 98.6 F (37 C) Oral 82 19 100 % - -  04/25/19 1633 - - - - - - 5\' 7"  (1.702 m) 77.1 kg  04/25/19 1622 - - - - - 100 % - -     High dose treatment with narcotic analgesia for sickle cell crisis, ordered.  Medical Decision Making: Recurrent sickle cell crisis secondary nonspecific vomiting.  Debility present initially.  Patient not expected to require hospitalization.  CRITICAL CARE-yes Performed by: Mancel BaleElliott Alisea Matte  Nursing Notes Reviewed/ Care Coordinated Applicable Imaging Reviewed Interpretation of Laboratory Data incorporated into ED treatment  Care to oncoming provider team to complete treatment, review labs and then reassess.  Final Clinical Impressions(s) / ED Diagnoses   Final diagnoses:  Sickle cell anemia with pain [facility](HCC)    ED Discharge Orders    None       Mancel BaleWentz, Nayeliz Hipp, MD 04/26/19 1022

## 2019-04-25 NOTE — Discharge Instructions (Signed)
Continue home medications and follow up with your doctor if symptoms persist.   Return to the ED as needed for uncontrolled pain, vomiting, development of fever or concerning symptoms.

## 2019-04-25 NOTE — ED Notes (Signed)
Patient request lab draw from port. 

## 2019-04-27 ENCOUNTER — Non-Acute Institutional Stay (HOSPITAL_BASED_OUTPATIENT_CLINIC_OR_DEPARTMENT_OTHER)
Admission: AD | Admit: 2019-04-27 | Discharge: 2019-04-27 | Disposition: A | Discharge status: Home / Self Care | Payer: 59 | Source: Ambulatory Visit | Attending: Internal Medicine | Admitting: Internal Medicine

## 2019-04-27 ENCOUNTER — Other Ambulatory Visit: Payer: Self-pay

## 2019-04-27 ENCOUNTER — Emergency Department (HOSPITAL_COMMUNITY)
Admission: EM | Admit: 2019-04-27 | Discharge: 2019-04-27 | Disposition: A | Discharge status: Home / Self Care | Payer: 59 | Attending: Emergency Medicine | Admitting: Emergency Medicine

## 2019-04-27 ENCOUNTER — Telehealth: Payer: Self-pay | Admitting: Family Medicine

## 2019-04-27 ENCOUNTER — Encounter (HOSPITAL_COMMUNITY): Payer: Self-pay | Admitting: *Deleted

## 2019-04-27 DIAGNOSIS — M79606 Pain in leg, unspecified: Secondary | ICD-10-CM | POA: Insufficient documentation

## 2019-04-27 DIAGNOSIS — R109 Unspecified abdominal pain: Secondary | ICD-10-CM | POA: Diagnosis present

## 2019-04-27 DIAGNOSIS — F1210 Cannabis abuse, uncomplicated: Secondary | ICD-10-CM | POA: Insufficient documentation

## 2019-04-27 DIAGNOSIS — D5700 Hb-SS disease with crisis, unspecified: Secondary | ICD-10-CM | POA: Insufficient documentation

## 2019-04-27 DIAGNOSIS — F1120 Opioid dependence, uncomplicated: Secondary | ICD-10-CM | POA: Insufficient documentation

## 2019-04-27 DIAGNOSIS — Z87891 Personal history of nicotine dependence: Secondary | ICD-10-CM | POA: Insufficient documentation

## 2019-04-27 DIAGNOSIS — M545 Low back pain: Secondary | ICD-10-CM | POA: Insufficient documentation

## 2019-04-27 DIAGNOSIS — Z79899 Other long term (current) drug therapy: Secondary | ICD-10-CM | POA: Insufficient documentation

## 2019-04-27 DIAGNOSIS — D57219 Sickle-cell/Hb-C disease with crisis, unspecified: Secondary | ICD-10-CM | POA: Diagnosis not present

## 2019-04-27 DIAGNOSIS — R001 Bradycardia, unspecified: Secondary | ICD-10-CM | POA: Diagnosis not present

## 2019-04-27 DIAGNOSIS — Z832 Family history of diseases of the blood and blood-forming organs and certain disorders involving the immune mechanism: Secondary | ICD-10-CM | POA: Insufficient documentation

## 2019-04-27 DIAGNOSIS — G894 Chronic pain syndrome: Secondary | ICD-10-CM | POA: Insufficient documentation

## 2019-04-27 LAB — URINALYSIS, ROUTINE W REFLEX MICROSCOPIC
Bilirubin Urine: NEGATIVE
Glucose, UA: NEGATIVE mg/dL
Hgb urine dipstick: NEGATIVE
Ketones, ur: NEGATIVE mg/dL
Leukocytes,Ua: NEGATIVE
Nitrite: NEGATIVE
Protein, ur: 30 mg/dL — AB
Specific Gravity, Urine: 1.025 (ref 1.005–1.030)
pH: 8 (ref 5.0–8.0)

## 2019-04-27 LAB — COMPREHENSIVE METABOLIC PANEL
ALT: 23 U/L (ref 0–44)
AST: 10 U/L — ABNORMAL LOW (ref 15–41)
Albumin: 5 g/dL (ref 3.5–5.0)
Alkaline Phosphatase: 49 U/L (ref 38–126)
Anion gap: 10 (ref 5–15)
BUN: 13 mg/dL (ref 6–20)
CO2: 26 mmol/L (ref 22–32)
Calcium: 9.7 mg/dL (ref 8.9–10.3)
Chloride: 102 mmol/L (ref 98–111)
Creatinine, Ser: 0.9 mg/dL (ref 0.61–1.24)
GFR calc Af Amer: >60 mL/min (ref >60)
GFR calc non Af Amer: >60 mL/min (ref >60)
Glucose, Bld: 135 mg/dL — ABNORMAL HIGH (ref 70–99)
Potassium: 3.5 mmol/L (ref 3.5–5.1)
Sodium: 138 mmol/L (ref 135–145)
Total Bilirubin: 2 mg/dL — ABNORMAL HIGH (ref 0.3–1.2)
Total Protein: 8.2 g/dL — ABNORMAL HIGH (ref 6.5–8.1)

## 2019-04-27 LAB — CBC WITH DIFFERENTIAL/PLATELET
Abs Immature Granulocytes: 0.07 10*3/uL (ref 0.00–0.07)
Basophils Absolute: 0 10*3/uL (ref 0.0–0.1)
Basophils Relative: 0 %
Eosinophils Absolute: 0 10*3/uL (ref 0.0–0.5)
Eosinophils Relative: 0 %
HCT: 35.6 % — ABNORMAL LOW (ref 39.0–52.0)
Hemoglobin: 12.7 g/dL — ABNORMAL LOW (ref 13.0–17.0)
Immature Granulocytes: 1 %
Lymphocytes Relative: 12 %
Lymphs Abs: 1.4 10*3/uL (ref 0.7–4.0)
MCH: 28.2 pg (ref 26.0–34.0)
MCHC: 35.7 g/dL (ref 30.0–36.0)
MCV: 79.1 fL — ABNORMAL LOW (ref 80.0–100.0)
Monocytes Absolute: 0.6 10*3/uL (ref 0.1–1.0)
Monocytes Relative: 5 %
Neutro Abs: 10.1 10*3/uL — ABNORMAL HIGH (ref 1.7–7.7)
Neutrophils Relative %: 82 %
Platelets: 106 10*3/uL — ABNORMAL LOW (ref 150–400)
RBC: 4.5 MIL/uL (ref 4.22–5.81)
RDW: 14.2 % (ref 11.5–15.5)
WBC: 12.2 10*3/uL — ABNORMAL HIGH (ref 4.0–10.5)
nRBC: 0.3 % — ABNORMAL HIGH (ref 0.0–0.2)

## 2019-04-27 LAB — RAPID URINE DRUG SCREEN, HOSP PERFORMED
Amphetamines: NOT DETECTED
Barbiturates: NOT DETECTED
Benzodiazepines: NOT DETECTED
Cocaine: NOT DETECTED
Opiates: POSITIVE — AB
Tetrahydrocannabinol: POSITIVE — AB

## 2019-04-27 LAB — RETICULOCYTES
Immature Retic Fract: 33.2 % — ABNORMAL HIGH (ref 2.3–15.9)
RBC.: 4.5 MIL/uL (ref 4.22–5.81)
Retic Count, Absolute: 215.6 10*3/uL — ABNORMAL HIGH (ref 19.0–186.0)
Retic Ct Pct: 4.8 % — ABNORMAL HIGH (ref 0.4–3.1)

## 2019-04-27 LAB — LIPASE, BLOOD: Lipase: 21 U/L (ref 11–51)

## 2019-04-27 MED ORDER — NALOXONE HCL 0.4 MG/ML IJ SOLN: 0.4000 mg | INTRAMUSCULAR | Status: DC | PRN

## 2019-04-27 MED ORDER — HYDROMORPHONE HCL 1 MG/ML IJ SOLN
1.0000 mg | Freq: Once | INTRAMUSCULAR | Status: AC
  Administered 2019-04-27: 1 mg via INTRAVENOUS
  Filled 2019-04-27: qty 1

## 2019-04-27 MED ORDER — ONDANSETRON HCL 4 MG/2ML IJ SOLN: 4.0000 mg | Freq: Four times a day (QID) | INTRAMUSCULAR | Status: DC | PRN

## 2019-04-27 MED ORDER — ONDANSETRON HCL 4 MG/2ML IJ SOLN
4.0000 mg | Freq: Once | INTRAMUSCULAR | Status: AC
  Administered 2019-04-27: 4 mg via INTRAVENOUS
  Filled 2019-04-27: qty 2

## 2019-04-27 MED ORDER — HYDROMORPHONE 1 MG/ML IV SOLN
INTRAVENOUS | Status: DC
  Administered 2019-04-27: 30 mg via INTRAVENOUS
  Administered 2019-04-27: 4 mg via INTRAVENOUS
  Filled 2019-04-27: qty 30

## 2019-04-27 MED ORDER — SODIUM CHLORIDE 0.9 % IV SOLN
25.0000 mg | INTRAVENOUS | Status: DC | PRN
  Filled 2019-04-27: qty 0.5

## 2019-04-27 MED ORDER — SODIUM CHLORIDE 0.9% FLUSH: 9.0000 mL | INTRAVENOUS | Status: DC | PRN

## 2019-04-27 MED ORDER — DEXTROSE-NACL 5-0.45 % IV SOLN
INTRAVENOUS | Status: DC
  Administered 2019-04-27: 14:00:00 via INTRAVENOUS

## 2019-04-27 MED ORDER — DIPHENHYDRAMINE HCL 25 MG PO CAPS: 25.0000 mg | ORAL_CAPSULE | ORAL | Status: DC | PRN

## 2019-04-27 MED ORDER — DEXTROSE-NACL 5-0.45 % IV SOLN
INTRAVENOUS | Status: DC
  Administered 2019-04-27: 10:00:00 via INTRAVENOUS

## 2019-04-27 NOTE — Progress Notes (Signed)
TOC CM chart reviewed. 5 ED visits in past six months, last PCP appt was on 03/19/2019. [place], [place] ED TOC CM [phone]

## 2019-04-27 NOTE — ED Notes (Signed)
Pt verbalized Nausea has improved. Pt reminded need for urine sample but denies having to void. Bedside urinal provided

## 2019-04-27 NOTE — H&P (Signed)
[facility] History and Physical   Date: 04/27/2019  Patient name: Austin [name] Medical record number: [id number] Date of birth: 08-03-1990 Age: 28 y.o. Gender: male PCP: Kallie Locks, FNP  Attending physician: Quentin Angst, MD  Chief Complaint: Sickle cell pain  History of Present Illness:  Austin [name], a 28 year old male with a medical history significant for sickle cell disease, chronic pain syndrome, opiate tolerance, and opiate dependence presented complaining of low back and lower extremity pain that is consistent with sickle cell pain crisis.  Patient was treated and evaluated in the ER this a.m.  Agreed with ER provider that patient was appropriate to transition to sickle cell day infusion center for further pain management and extended observation.  Patient presented to the ER with nausea, vomiting, and abdominal pain for greater than 24 hours.  He states that he ingested undercooked meat.  Patient states that he had several episodes of vomiting and diarrhea and was dehydrated.  He says that he has some improvement with oxycodone, however he was unable to keep medications down consistently due to increased vomiting.  Patient arrived in stable condition.  States that pain intensity is 7-8/10 primarily to low back and lower extremities.  Pain persists despite several doses of IV Dilaudid in ER.  Patient denies sick contacts, recent travel, or exposure to COVID-19.  He also denies fever, chills, shortness of breath, chest pain, dizziness, paresthesias, dysuria, diarrhea, or constipation.     Meds: Medications Prior to Admission  Medication Sig Dispense Refill Last Dose  . folic acid (FOLVITE) 1 MG tablet Take 1 tablet (1 mg total) by mouth daily. 30 tablet 11 Past Month at Unknown time  . oxyCODONE-acetaminophen (PERCOCET) 10-325 MG tablet Take 1 tablet by mouth every 4 (four) hours as needed for pain.   Past Week at Unknown time  . acetaminophen  (TYLENOL) 500 MG tablet Take 1 tablet (500 mg total) by mouth 2 (two) times daily. (Patient not taking: Reported on 03/19/2019) 60 tablet 3   . ibuprofen (ADVIL,MOTRIN) 800 MG tablet Take 1 tablet (800 mg total) by mouth every 8 (eight) hours as needed. (Patient not taking: Reported on 03/19/2019) 30 tablet 1   . loratadine (CLARITIN) 10 MG tablet Take 1 tablet (10 mg total) by mouth daily. (Patient not taking: Reported on 04/25/2019) 30 tablet 6   . promethazine (PHENERGAN) 12.5 MG tablet Take 1 tablet (12.5 mg total) by mouth every 6 (six) hours as needed for nausea or vomiting. 30 tablet 1   . tetrahydrozoline 0.05 % ophthalmic solution Place 1 drop into both eyes 2 (two) times daily as needed (Red eyes).       Allergies: Patient has no known allergies. Past Medical History:  Diagnosis Date  . Acute maxillary sinusitis   . Eczema   . Sickle cell anemia (HCC)    Past Surgical History:  Procedure Laterality Date  . WISDOM TOOTH EXTRACTION     Family History  Problem Relation Age of Onset  . Sickle cell trait Mother   . Diabetes Father   . Hypertension Father    Social History   Socioeconomic History  . Marital status: Married    Spouse name: Not on file  . Number of children: Not on file  . Years of education: Not on file  . Highest education level: Not on file  Occupational History  . Not on file  Social Needs  . Financial resource strain: Not on file  . Food  insecurity    Worry: Not on file    Inability: Not on file  . Transportation needs    Medical: Not on file    Non-medical: Not on file  Tobacco Use  . Smoking status: Former Smoker    Packs/day: 0.10  . Smokeless tobacco: Never Used  Substance and Sexual Activity  . Alcohol use: Not Currently    Comment: occ  . Drug use: Yes    Types: Marijuana  . Sexual activity: Not on file  Lifestyle  . Physical activity    Days per week: Not on file    Minutes per session: Not on file  . Stress: Not on file   Relationships  . Social Herbalist on phone: Not on file    Gets together: Not on file    Attends religious service: Not on file    Active member of club or organization: Not on file    Attends meetings of clubs or organizations: Not on file    Relationship status: Not on file  . Intimate partner violence    Fear of current or ex partner: Not on file    Emotionally abused: Not on file    Physically abused: Not on file    Forced sexual activity: Not on file  Other Topics Concern  . Not on file  Social History Narrative  . Not on file  Review of Systems  Constitutional: Negative for chills and fever.  Eyes: Negative.   Respiratory: Negative for shortness of breath.   Cardiovascular: Negative.   Gastrointestinal: Positive for abdominal pain, nausea and vomiting. Negative for blood in stool, constipation and diarrhea.  Genitourinary: Negative for dysuria and urgency.  Musculoskeletal: Positive for back pain and joint pain.  Skin: Negative.   Neurological: Negative.   Psychiatric/Behavioral: Negative.     Physical Exam: Blood pressure 124/66, pulse 69, temperature 98.1 F (36.7 C), temperature source Oral, resp. rate 18, SpO2 100 %. BP 111/66 (BP Location: Left Arm)   Pulse 66   Temp 98.1 F (36.7 C) (Oral)   Resp 10   SpO2 98%   General Appearance:    Alert, cooperative, no distress, appears stated age  Head:    Normocephalic, without obvious abnormality, atraumatic  Eyes:    PERRL, conjunctiva/corneas clear, EOM's intact, fundi    benign, both eyes       Ears:    Normal TM's and external ear canals, both ears  Nose:   Nares normal, septum midline, mucosa normal, no drainage   or sinus tenderness  Throat:   Lips, mucosa, and tongue normal; teeth and gums normal  Neck:   Supple, symmetrical, trachea midline, no adenopathy;       thyroid:  No enlargement/tenderness/nodules; no carotid   bruit or JVD  Back:     Symmetric, no curvature, ROM normal, no CVA  tenderness  Lungs:     Clear to auscultation bilaterally, respirations unlabored  Chest wall:    No tenderness or deformity  Heart:    Regular rate and rhythm, S1 and S2 normal, no murmur, rub   or gallop  Abdomen:     Soft, non-tender, bowel sounds active all four quadrants,    no masses, no organomegaly  Extremities:   Extremities normal, atraumatic, no cyanosis or edema  Pulses:   2+ and symmetric all extremities  Skin:   Skin color, texture, turgor normal, no rashes or lesions  Lymph nodes:   Cervical, supraclavicular, and axillary nodes  normal  Neurologic:   CNII-XII intact. Normal strength, sensation and reflexes      throughout    Lab results: Results for orders placed or performed during the hospital encounter of 04/27/19 (from the past 24 hour(s))  CBC WITH DIFFERENTIAL     Status: Abnormal   Collection Time: 04/27/19  9:00 AM  Result Value Ref Range   WBC 12.2 (H) 4.0 - 10.5 K/uL   RBC 4.50 4.22 - 5.81 MIL/uL   Hemoglobin 12.7 (L) 13.0 - 17.0 g/dL   HCT 29.5 (L) 62.1 - 30.8 %   MCV 79.1 (L) 80.0 - 100.0 fL   MCH 28.2 26.0 - 34.0 pg   MCHC 35.7 30.0 - 36.0 g/dL   RDW 65.7 84.6 - 96.2 %   Platelets 106 (L) 150 - 400 K/uL   nRBC 0.3 (H) 0.0 - 0.2 %   Neutrophils Relative % 82 %   Neutro Abs 10.1 (H) 1.7 - 7.7 K/uL   Lymphocytes Relative 12 %   Lymphs Abs 1.4 0.7 - 4.0 K/uL   Monocytes Relative 5 %   Monocytes Absolute 0.6 0.1 - 1.0 K/uL   Eosinophils Relative 0 %   Eosinophils Absolute 0.0 0.0 - 0.5 K/uL   Basophils Relative 0 %   Basophils Absolute 0.0 0.0 - 0.1 K/uL   Immature Granulocytes 1 %   Abs Immature Granulocytes 0.07 0.00 - 0.07 K/uL  Comprehensive metabolic panel     Status: Abnormal   Collection Time: 04/27/19  9:00 AM  Result Value Ref Range   Sodium 138 135 - 145 mmol/L   Potassium 3.5 3.5 - 5.1 mmol/L   Chloride 102 98 - 111 mmol/L   CO2 26 22 - 32 mmol/L   Glucose, Bld 135 (H) 70 - 99 mg/dL   BUN 13 6 - 20 mg/dL   Creatinine, Ser 9.52 0.61 -  1.24 mg/dL   Calcium 9.7 8.9 - 84.1 mg/dL   Total Protein 8.2 (H) 6.5 - 8.1 g/dL   Albumin 5.0 3.5 - 5.0 g/dL   AST 10 (L) 15 - 41 U/L   ALT 23 0 - 44 U/L   Alkaline Phosphatase 49 38 - 126 U/L   Total Bilirubin 2.0 (H) 0.3 - 1.2 mg/dL   GFR calc non Af Amer >60 >60 mL/min   GFR calc Af Amer >60 >60 mL/min   Anion gap 10 5 - 15  Reticulocytes     Status: Abnormal   Collection Time: 04/27/19  9:00 AM  Result Value Ref Range   Retic Ct Pct 4.8 (H) 0.4 - 3.1 %   RBC. 4.50 4.22 - 5.81 MIL/uL   Retic Count, Absolute 215.6 (H) 19.0 - 186.0 K/uL   Immature Retic Fract 33.2 (H) 2.3 - 15.9 %  Lipase, blood     Status: None   Collection Time: 04/27/19  9:00 AM  Result Value Ref Range   Lipase 21 11 - 51 U/L  Urine rapid drug screen (hosp performed)     Status: Abnormal   Collection Time: 04/27/19  9:44 AM  Result Value Ref Range   Opiates POSITIVE (A) NONE DETECTED   Cocaine NONE DETECTED NONE DETECTED   Benzodiazepines NONE DETECTED NONE DETECTED   Amphetamines NONE DETECTED NONE DETECTED   Tetrahydrocannabinol POSITIVE (A) NONE DETECTED   Barbiturates NONE DETECTED NONE DETECTED  Urinalysis, Routine w reflex microscopic     Status: Abnormal   Collection Time: 04/27/19 10:39 AM  Result Value Ref Range  Color, Urine AMBER (A) YELLOW   APPearance CLOUDY (A) CLEAR   Specific Gravity, Urine 1.025 1.005 - 1.030   pH 8.0 5.0 - 8.0   Glucose, UA NEGATIVE NEGATIVE mg/dL   Hgb urine dipstick NEGATIVE NEGATIVE   Bilirubin Urine NEGATIVE NEGATIVE   Ketones, ur NEGATIVE NEGATIVE mg/dL   Protein, ur 30 (A) NEGATIVE mg/dL   Nitrite NEGATIVE NEGATIVE   Leukocytes,Ua NEGATIVE NEGATIVE   RBC / HPF 0-5 0 - 5 RBC/hpf   WBC, UA 0-5 0 - 5 WBC/hpf   Bacteria, UA RARE (A) NONE SEEN   Mucus PRESENT     Imaging results:  No results found.   Assessment & Plan: [name] was admitted to sickle cell day infusion center for management of pain crisis.  Patient is opiate tolerant.  His pain  persists despite several doses of IV Dilaudid in ER.  Also, patient received IV Toradol. We will initiate IV Dilaudid via PCA per weight-based protocol.  Settings of 0.5 mg, 10-minute lockout, and 3 mg/h.  Zofran 4 mg IV every 4 hours as needed IV fluids, D5 0.45% saline at 125 mL/h Reviewed all laboratory values, consistent with baseline.  Lipase negative. Labs do not warrant repeating at this time. Pain will be reevaluated in context of functioning and relationship to baseline as his care progresses. If pain intensity remains elevated, transition to inpatient services for higher level of care.  Nolon NationsLachina Moore Shakeitha Umbaugh  APRN, MSN, FNP-C Patient Care [facility] [address]  [place], [place] [address] [phone]  04/27/2019, 2:05 PM THIS NOTE WAS PREPARED USING DRAGON SOFTWARE.

## 2019-04-27 NOTE — Telephone Encounter (Signed)
Pt is requesting a refill , please advise. 

## 2019-04-27 NOTE — Discharge Instructions (Signed)
I have spoken to the provider at the sickle cell clinic and they are expecting you there for further management. Return to the ED if you start to experience chest pain, shortness of breath, numbness in arms or legs.

## 2019-04-27 NOTE — ED Notes (Signed)
Pt made aware urine sample needed 

## 2019-04-27 NOTE — ED Notes (Signed)
Report called to [place] staff to pick up patient for transfer with PIV in place. Patient given discharge instructions. VSS at this time. Patient reports pain remains 8/10.

## 2019-04-27 NOTE — ED Provider Notes (Signed)
[place] DEPT Provider Note   CSN: [id number] Arrival date & time: 04/27/19  0753     History   Chief Complaint Chief Complaint  Patient presents with  . Abdominal Pain    Sickle Cell  . Emesis    HPI Austin [name] is a 28 y.o. male with a past medical history of sickle cell anemia presents to ED for lower back pain, emesis.  States that he had his typical lower back pain related to his sickle cell crisis starting yesterday.  He had some improvement with oxycodone at home.  Started having vomiting for the past 3 hours.  He has not tried any medications to help with vomiting.  Denies sick contacts with similar symptoms.  Also had one episode of diarrhea.  Reports generalized abdominal cramping.  Cannot recall any inciting event that may have triggered the symptoms.  He denies any fevers, cough, shortness of breath, injuries or falls, loss of bowel or bladder function or urinary symptoms.     HPI  Past Medical History:  Diagnosis Date  . Acute maxillary sinusitis   . Eczema   . Sickle cell anemia [facility])     Patient Active Problem List   Diagnosis Date Noted  . Medication management 03/20/2019  . Bilious vomiting without nausea   . Viral gastroenteritis 01/23/2019  . Nausea vomiting and diarrhea   . Generalized abdominal pain   . Hb-SS disease with crisis ([place])   . Dehydration   . Sickle cell pain crisis ([place]) 05/18/2018  . Acute sickle cell crisis ([place]) 12/15/2017  . Thrombocytopenia ([place]) 12/15/2017  . Splenomegaly 12/15/2017  . Sickle cell anemia with crisis ([place]) 12/15/2017  . 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 Spleen enlarged 08/18/2017    Past Surgical History:  Procedure Laterality Date  . WISDOM TOOTH EXTRACTION          Home Medications    Prior to Admission medications   Medication Sig Start Date End Date Taking? Authorizing Provider  folic acid (FOLVITE) 1 MG tablet Take 1 tablet (1 mg total) by mouth  daily. 01/25/19  Yes Dorena Dew, FNP  oxyCODONE-acetaminophen (PERCOCET) 10-325 MG tablet Take 1 tablet by mouth every 4 (four) hours as needed for pain.   Yes [provider]  promethazine (PHENERGAN) 12.5 MG tablet Take 1 tablet (12.5 mg total) by mouth every 6 (six) hours as needed for nausea or vomiting. 03/19/19 04/27/19 Yes Azzie Glatter, FNP  tetrahydrozoline 0.05 % ophthalmic solution Place 1 drop into both eyes 2 (two) times daily as needed (Red eyes).   Yes [provider]  acetaminophen (TYLENOL) 500 MG tablet Take 1 tablet (500 mg total) by mouth 2 (two) times daily. Patient not taking: Reported on 03/19/2019 09/04/18   Azzie Glatter, FNP  ibuprofen (ADVIL,MOTRIN) 800 MG tablet Take 1 tablet (800 mg total) by mouth every 8 (eight) hours as needed. Patient not taking: Reported on 03/19/2019 05/10/18   Azzie Glatter, FNP  loratadine (CLARITIN) 10 MG tablet Take 1 tablet (10 mg total) by mouth daily. Patient not taking: Reported on 04/25/2019 10/03/18   Azzie Glatter, FNP    Family History Family History  Problem Relation Age of Onset  . Sickle cell trait Mother   . Diabetes Father   . Hypertension Father     Social History Social History   Tobacco Use  . Smoking status: Former Smoker    Packs/day: 0.10  . Smokeless tobacco:  Never Used  Substance Use Topics  . Alcohol use: Not Currently    Comment: occ  . Drug use: Yes    Types: Marijuana     Allergies   Patient has no known allergies.   Review of Systems Review of Systems  Constitutional: Negative for appetite change, chills and fever.  HENT: Negative for ear pain, rhinorrhea, sneezing and sore throat.   Eyes: Negative for photophobia and visual disturbance.  Respiratory: Negative for cough, chest tightness, shortness of breath and wheezing.   Cardiovascular: Negative for chest pain and palpitations.  Gastrointestinal: Positive for abdominal pain, diarrhea, nausea and vomiting.  Negative for blood in stool and constipation.  Genitourinary: Negative for dysuria, hematuria and urgency.  Musculoskeletal: Positive for myalgias.  Skin: Negative for rash.  Neurological: Negative for dizziness, weakness and light-headedness.     Physical Exam Updated Vital Signs BP 114/76   Pulse 72   Temp 97.6 F (36.4 C) (Axillary)   Resp 17   Ht  (1.702 m)   Wt 72.6 kg   SpO2 100%   BMI 25.06 kg/m   Physical Exam Vitals signs and nursing note reviewed.  Constitutional:      General: He is not in acute distress.    Appearance: He is well-developed.  HENT:     Head: Normocephalic and atraumatic.     Nose: Nose normal.  Eyes:     General: No scleral icterus.       Left eye: No discharge.     Conjunctiva/sclera: Conjunctivae normal.  Neck:     Musculoskeletal: Normal range of motion and neck supple.  Cardiovascular:     Rate and Rhythm: Normal rate and regular rhythm.     Heart sounds: Normal heart sounds. No murmur. No friction rub. No gallop.   Pulmonary:     Effort: Pulmonary effort is normal. No respiratory distress.     Breath sounds: Normal breath sounds.  Abdominal:     General: Bowel sounds are normal. There is no distension.     Palpations: Abdomen is soft.     Tenderness: There is generalized abdominal tenderness. There is no guarding.  Musculoskeletal: Normal range of motion.  Skin:    General: Skin is warm and dry.     Findings: No rash.  Neurological:     Mental Status: He is alert.     Motor: No abnormal muscle tone.     Coordination: Coordination normal.      ED Treatments / Results  Labs (all labs ordered are listed, but only abnormal results are displayed) Labs Reviewed  CBC WITH DIFFERENTIAL/PLATELET - Abnormal; Notable for the following components:      Result Value   WBC 12.2 (*)    Hemoglobin 12.7 (*)    HCT 35.6 (*)    MCV 79.1 (*)    Platelets 106 (*)    nRBC 0.3 (*)    Neutro Abs 10.1 (*)    All other components  within normal limits  COMPREHENSIVE METABOLIC PANEL - Abnormal; Notable for the following components:   Glucose, Bld 135 (*)    Total Protein 8.2 (*)    AST 10 (*)    Total Bilirubin 2.0 (*)    All other components within normal limits  RETICULOCYTES - Abnormal; Notable for the following components:   Retic Ct Pct 4.8 (*)    Retic Count, Absolute 215.6 (*)    Immature Retic Fract 33.2 (*)    All other components within normal  limits  URINALYSIS, ROUTINE W REFLEX MICROSCOPIC - Abnormal; Notable for the following components:   Color, Urine AMBER (*)    APPearance CLOUDY (*)    Protein, ur 30 (*)    Bacteria, UA RARE (*)    All other components within normal limits  RAPID URINE DRUG SCREEN, HOSP PERFORMED - Abnormal; Notable for the following components:   Opiates POSITIVE (*)    Tetrahydrocannabinol POSITIVE (*)    All other components within normal limits  LIPASE, BLOOD    EKG EKG Interpretation  Date/Time:  Friday April 27 2019 08:21:21 EDT Ventricular Rate:  58 PR Interval:    QRS Duration: 91 QT Interval:  383 QTC Calculation: 377 R Axis:   89 Text Interpretation:  Age not entered, assumed to be  28 years old for purpose of ECG interpretation Sinus rhythm Borderline short PR interval Abnormal R-wave progression, early transition Probable inferior infarct, old no change from previous Confirmed by Arby Barrette [id number]) on 04/27/2019 8:34:07 AM   Radiology No results found.  Procedures Procedures (including critical care time)  Medications Ordered in ED Medications  dextrose 5 %-0.45 % sodium chloride infusion ( Intravenous New Bag/Given 04/27/19 0938)  ondansetron (ZOFRAN) injection 4 mg (4 mg Intravenous Given 04/27/19 0928)  HYDROmorphone (DILAUDID) injection 1 mg (1 mg Intravenous Given 04/27/19 0928)  HYDROmorphone (DILAUDID) injection 1 mg (1 mg Intravenous Given 04/27/19 1034)     Initial Impression / Assessment and Plan / ED Course  I have reviewed the  triage vital signs and the nursing notes.  Pertinent labs & imaging results that were available during my care of the patient were reviewed by me and considered in my medical decision making (see chart for details).  Clinical Course as of Apr 26 1248  Fri Apr 27, 2019  8182 Patient without emesis since Zofran given.  He is resting comfortably with states that his back pain is still 9 out of 10.   [HK]    Clinical Course User Index [HK] Dietrich Pates, PA-C       28 year old male with a past medical history of sickle cell anemia presents to ED for sickle cell crisis, emesis and diarrhea.  He has been having pain since yesterday but started vomiting and having diarrhea this morning.  Reports generalized abdominal pain with vomiting.  On exam abdomen is generally tender without rebound or guarding.  Patient dry heaving on exam.  EKG with no changes from prior tracings.  Lab work is unremarkable.  Leukocytosis has improved, hemoglobin at baseline.  UDS is positive for opiates and THC.  Patient given Zofran, fluids here with complete resolution of his abdominal pain and vomiting.  He is able to tolerate p.o. intake without difficulty.  He was given 2 rounds of IV Dilaudid with no improvement in his sickle cell pain. Doubt surgical or emergent cause of his GI issues, most likely viral in nature.  He is comfortable with discharge to the sickle cell clinic for continued pain management.    Portions of this note were generated with Scientist, clinical (histocompatibility and immunogenetics). Dictation errors may occur despite best attempts at proofreading.   Final Clinical Impressions(s) / ED Diagnoses   Final diagnoses:  Sickle cell pain crisis [facility])    ED Discharge Orders    None       Dietrich Pates, PA-C 04/27/19 1249    Arby Barrette, MD 04/30/19 [phone]

## 2019-04-27 NOTE — ED Triage Notes (Signed)
Pt arrived POV from home CC abd pain emesis. Hx Sickle Cell

## 2019-04-27 NOTE — Progress Notes (Signed)
Patient admitted to the day infusion hospital from [facility] for sickle cell pain. Initially, patient reported back pain rated 7/10. For pain management, patient placed on Dilaudid PCA and hydrated with IV fluids. At discharge, patient rated pain at 2/10. Vital signs stable. Discharge instructions given. Patient alert, oriented and ambulatory at discharge.

## 2019-04-28 ENCOUNTER — Other Ambulatory Visit: Payer: Self-pay | Admitting: Family Medicine

## 2019-04-28 DIAGNOSIS — G894 Chronic pain syndrome: Secondary | ICD-10-CM

## 2019-04-28 DIAGNOSIS — F1190 Opioid use, unspecified, uncomplicated: Secondary | ICD-10-CM

## 2019-04-28 DIAGNOSIS — D571 Sickle-cell disease without crisis: Secondary | ICD-10-CM

## 2019-04-28 MED ORDER — OXYCODONE-ACETAMINOPHEN 10-325 MG PO TABS: 1.0000 | ORAL_TABLET | Freq: Four times a day (QID) | ORAL | 0 refills | Status: AC | PRN

## 2019-04-28 NOTE — Discharge Summary (Signed)
[facility] Discharge Summary   Patient ID: Austin [name] MRN: [id number] DOB/AGE: 1990/09/22 28 y.o.  Admit date: 04/27/2019 Discharge date: 04/28/2019  Primary Care Physician:  Azzie Glatter, FNP  Admission Diagnoses:  Active Problems:   Acute sickle cell crisis [facility])   Discharge Diagnoses:   Sickle cell disease  Discharge Medications:  Allergies as of 04/27/2019   No Known Allergies     Medication List    TAKE these medications   acetaminophen 500 MG tablet Commonly known as: TYLENOL Take 1 tablet (500 mg total) by mouth 2 (two) times daily.   folic acid 1 MG tablet Commonly known as: FOLVITE Take 1 tablet (1 mg total) by mouth daily.   ibuprofen 800 MG tablet Commonly known as: ADVIL Take 1 tablet (800 mg total) by mouth every 8 (eight) hours as needed.   loratadine 10 MG tablet Commonly known as: CLARITIN Take 1 tablet (10 mg total) by mouth daily.   oxyCODONE-acetaminophen 10-325 MG tablet Commonly known as: PERCOCET Take 1 tablet by mouth every 4 (four) hours as needed for pain.   promethazine 12.5 MG tablet Commonly known as: PHENERGAN Take 1 tablet (12.5 mg total) by mouth every 6 (six) hours as needed for nausea or vomiting.   tetrahydrozoline 0.05 % ophthalmic solution Place 1 drop into both eyes 2 (two) times daily as needed (Red eyes).        Consults:  None  Significant Diagnostic Studies:  No results found.  History of present illness: Austin [name], a 28 year old male with a medical history significant for sickle cell disease, chronic pain syndrome, opiate tolerance, and opiate dependence presented complaining of low back and lower extremity pain that is consistent with sickle cell pain crisis.  Patient was treated and evaluated in the ER this a.m.  Agreed with ER provider that patient was appropriate to transition to sickle cell day infusion center for further pain management and extended observation.  Patient  presented to the ER with nausea, vomiting, and abdominal pain for greater than 24 hours.  He states that he ingested undercooked meat.  Patient states that he had several episodes of vomiting and diarrhea and was dehydrated.  He says that he has some improvement with oxycodone, however he was unable to keep medications down consistently due to increased vomiting.  Patient arrived in stable condition.  States that pain intensity is 7-8/10 primarily to low back and lower extremities.  Pain persists despite several doses of IV Dilaudid in ER.  Patient denies sick contacts, recent travel, or exposure to COVID-19.  He also denies fever, chills, shortness of breath, chest pain, dizziness, paresthesias, dysuria, diarrhea, or constipation.   [facility] Course: Austin [name] was admitted to sickle cell day infusion center for management of pain crisis. All laboratory values reviewed. Hemoglobin 12.7, consistent with patient's baseline.  No clinical indication for blood transfusion at this time.  WBCs 12.2, which is improved from 4 days prior.  Patient is afebrile and maintaining oxygen saturation above 90% on RA. Pain managed with IV Dilaudid via PCA per weight-based protocol.  Settings of 0.5 mg, 10-minute lockout, and 3 mg/h.   IV fluids, D5 0.45% saline at 100 mL/h  Pain intensity decreased to 2/10.  Patient states that we will attempt to manage pain at home on current medication regimen.  Patient does not warrant admission. Advised to continue antiemetics for increased nausea and vomiting Patient alert, oriented, and ambulating without assistance. He will discharge  home in a hemodynamically stable condition.  Discharge instructions: Resume all home medications.  Follow up with PCP as previously  scheduled.   Discussed the importance of drinking 64 ounces of water daily to  help prevent pain crises, it is important to drink plenty of water throughout the day. This is because dehydration  of red blood cells may lead further sickling.   Avoid all stressors that precipitate sickle cell pain crisis.     The patient was given clear instructions to go to ER or return to medical center if symptoms do not improve, worsen or new problems develop.    Physical Exam at Discharge:  BP 111/66 (BP Location: Left Arm)   Pulse 66   Temp 98.1 F (36.7 C) (Oral)   Resp 10   SpO2 98%  Physical Exam Constitutional:      Appearance: Normal appearance.  Eyes:     Pupils: Pupils are equal, round, and reactive to light.  Cardiovascular:     Rate and Rhythm: Normal rate and regular rhythm.  Pulmonary:     Effort: Pulmonary effort is normal.     Breath sounds: Normal breath sounds.  Abdominal:     General: Abdomen is flat. Bowel sounds are normal.  Musculoskeletal: Normal range of motion.  Skin:    General: Skin is warm.  Neurological:     General: No focal deficit present.     Mental Status: He is alert. Mental status is at baseline.      Disposition at Discharge: Discharge disposition: 01-Home or Self Care       Discharge Orders: Discharge Instructions    Discharge patient   Complete by: As directed    Discharge disposition: 01-Home or Self Care   Discharge patient date: 04/27/2019      Condition at Discharge:   Stable  Time spent on Discharge:  Greater than 30 minutes.  Signed: Nolon Nations  APRN, MSN, FNP-C Patient Care [facility] [address] [place], [place] [address] [phone]  04/28/2019, 9:20 AM

## 2019-04-30 NOTE — Telephone Encounter (Signed)
Spoke with patient. He is aware Rx has been sent.

## 2019-05-07 ENCOUNTER — Other Ambulatory Visit: Payer: Self-pay

## 2019-05-07 ENCOUNTER — Ambulatory Visit (INDEPENDENT_AMBULATORY_CARE_PROVIDER_SITE_OTHER): Payer: 59 | Admitting: Family Medicine

## 2019-05-07 ENCOUNTER — Encounter: Payer: Self-pay | Admitting: Family Medicine

## 2019-05-07 VITALS — BP 112/54 | HR 85 | Temp 97.9°F | Ht 67.0 in | Wt 164.0 lb

## 2019-05-07 DIAGNOSIS — D571 Sickle-cell disease without crisis: Secondary | ICD-10-CM | POA: Diagnosis not present

## 2019-05-07 DIAGNOSIS — Z09 Encounter for follow-up examination after completed treatment for conditions other than malignant neoplasm: Secondary | ICD-10-CM

## 2019-05-07 DIAGNOSIS — G894 Chronic pain syndrome: Secondary | ICD-10-CM | POA: Diagnosis not present

## 2019-05-07 DIAGNOSIS — R112 Nausea with vomiting, unspecified: Secondary | ICD-10-CM

## 2019-05-07 DIAGNOSIS — F1190 Opioid use, unspecified, uncomplicated: Secondary | ICD-10-CM

## 2019-05-07 LAB — POCT URINALYSIS DIPSTICK
Bilirubin, UA: NEGATIVE
Blood, UA: NEGATIVE
Glucose, UA: NEGATIVE
Ketones, UA: NEGATIVE
Leukocytes, UA: NEGATIVE
Nitrite, UA: NEGATIVE
Protein, UA: NEGATIVE
Spec Grav, UA: 1.02 (ref 1.010–1.025)
Urobilinogen, UA: 0.2 E.U./dL
pH, UA: 6 (ref 5.0–8.0)

## 2019-05-07 NOTE — Progress Notes (Signed)
[facility]   Established Patient Office Visit  Subjective:  Patient ID: Austin [name], male    DOB: 1991-02-12  Age: 28 y.o. MRN: [id number]  CC:  Chief Complaint  Patient presents with  . Follow-up    Sickle Cell     HPI Austin [name] is a 28 year old male who presents for Follow Up today.   Past Medical History:  Diagnosis Date  . Acute maxillary sinusitis   . Eczema   . Sickle cell anemia (HCC)     Current Status: Since his last office visit, he is doing well with no complaints. He states that he has pain in his lower back. He rates his pain today at 0/10. He has not had a hospital visit for Sickle Cell Crisis since 04/27/2019 where has was treated and discharged the same day. He is currently taking all medications as prescribed and staying well hydrated. He reports occasional nausea, constipation, dizziness and headaches. He denies fevers, chills, fatigue, recent infections, weight loss, and night sweats. He has not had any headaches, visual changes, dizziness, and falls. No chest pain, heart palpitations, cough and shortness of breath reported. No reports of GI problems such as nausea, vomiting, diarrhea, and constipation. He has no reports of blood in stools, dysuria and hematuria. No depression or anxiety, and denies suicidal ideations, homicidal ideations, or auditory hallucinations. He denies pain today.   Past Surgical History:  Procedure Laterality Date  . WISDOM TOOTH EXTRACTION      Family History  Problem Relation Age of Onset  . Sickle cell trait Mother   . Diabetes Father   . Hypertension Father     Social History   Socioeconomic History  . Marital status: Married    Spouse name: Not on file  . Number of children: Not on file  . Years of education: Not on file  . Highest education level: Not on file  Occupational History  . Not on file  Social Needs  . Financial resource strain: Not on file   . Food insecurity    Worry: Not on file    Inability: Not on file  . Transportation needs    Medical: Not on file    Non-medical: Not on file  Tobacco Use  . Smoking status: Former Smoker    Packs/day: 0.10  . Smokeless tobacco: Never Used  Substance and Sexual Activity  . Alcohol use: Not Currently    Comment: occ  . Drug use: Yes    Types: Marijuana  . Sexual activity: Not on file  Lifestyle  . Physical activity    Days per week: Not on file    Minutes per session: Not on file  . Stress: Not on file  Relationships  . Social Musician on phone: Not on file    Gets together: Not on file    Attends religious service: Not on file    Active member of club or organization: Not on file    Attends meetings of clubs or organizations: Not on file    Relationship status: Not on file  . Intimate partner violence    Fear of current or ex partner: Not on file    Emotionally abused: Not on file    Physically abused: Not on file    Forced sexual activity: Not on file  Other Topics Concern  . Not on file  Social History Narrative  . Not on file  Outpatient Medications Prior to Visit  Medication Sig Dispense Refill  . folic acid (FOLVITE) 1 MG tablet Take 1 tablet (1 mg total) by mouth daily. 30 tablet 11  . oxyCODONE-acetaminophen (PERCOCET) 10-325 MG tablet Take 1 tablet by mouth every 6 (six) hours as needed for up to 15 days for pain. 60 tablet 0  . tetrahydrozoline 0.05 % ophthalmic solution Place 1 drop into both eyes 2 (two) times daily as needed (Red eyes).    Marland Kitchen. oxyCODONE-acetaminophen (PERCOCET) 10-325 MG tablet Take 1 tablet by mouth every 4 (four) hours as needed for pain.    Marland Kitchen. ibuprofen (ADVIL,MOTRIN) 800 MG tablet Take 1 tablet (800 mg total) by mouth every 8 (eight) hours as needed. (Patient not taking: Reported on 03/19/2019) 30 tablet 1  . loratadine (CLARITIN) 10 MG tablet Take 1 tablet (10 mg total) by mouth daily. (Patient not taking: Reported on  04/25/2019) 30 tablet 6  . acetaminophen (TYLENOL) 500 MG tablet Take 1 tablet (500 mg total) by mouth 2 (two) times daily. (Patient not taking: Reported on 05/07/2019) 60 tablet 3  . promethazine (PHENERGAN) 12.5 MG tablet Take 1 tablet (12.5 mg total) by mouth every 6 (six) hours as needed for nausea or vomiting. 30 tablet 1   No facility-administered medications prior to visit.     No Known Allergies  ROS Review of Systems  Constitutional: Negative.   HENT: Negative.   Eyes: Negative.   Respiratory: Negative.   Cardiovascular: Negative.   Gastrointestinal: Positive for constipation and nausea.  Endocrine: Negative.   Genitourinary: Negative.   Musculoskeletal: Positive for arthralgias (occasional ).  Skin: Negative.   Allergic/Immunologic: Negative.   Neurological: Positive for dizziness and headaches.  Hematological: Negative.   Psychiatric/Behavioral: Negative.       Objective:    Physical Exam  Constitutional: He is oriented to person, place, and time. He appears well-developed and well-nourished.  HENT:  Head: Normocephalic and atraumatic.  Eyes: Conjunctivae are normal.  Neck: Normal range of motion. Neck supple.  Cardiovascular: Normal rate, regular rhythm, normal heart sounds and intact distal pulses.  Pulmonary/Chest: Effort normal and breath sounds normal.  Abdominal: Soft. Bowel sounds are normal.  Musculoskeletal: Normal range of motion.  Neurological: He is alert and oriented to person, place, and time. He has normal reflexes.  Skin: Skin is warm and dry.  Psychiatric: He has a normal mood and affect. His behavior is normal. Judgment and thought content normal.  Nursing note and vitals reviewed.  BP (!) 112/54 (BP Location: Left Arm, Patient Position: Sitting, Cuff Size: Normal)   Pulse 85   Temp 97.9 F (36.6 C) (Oral)   Ht 5\' 7"  (1.702 m)   Wt 164 lb (74.4 kg)   SpO2 99%   BMI 25.69 kg/m  Wt Readings from Last 3 Encounters:  05/07/19 164 lb (74.4  kg)  04/27/19 160 lb (72.6 kg)  04/25/19 170 lb (77.1 kg)     Health Maintenance Due  Topic Date Due  . INFLUENZA VACCINE  02/24/2019    There are no preventive care reminders to display for this patient.  Lab Results  Component Value Date   TSH 0.662 08/12/2017   Lab Results  Component Value Date   WBC 12.2 (H) 04/27/2019   HGB 12.7 (L) 04/27/2019   HCT 35.6 (L) 04/27/2019   MCV 79.1 (L) 04/27/2019   PLT 106 (L) 04/27/2019   Lab Results  Component Value Date   NA 138 04/27/2019   K 3.5  04/27/2019   CO2 26 04/27/2019   GLUCOSE 135 (H) 04/27/2019   BUN 13 04/27/2019   CREATININE 0.90 04/27/2019   BILITOT 2.0 (H) 04/27/2019   ALKPHOS 49 04/27/2019   AST 10 (L) 04/27/2019   ALT 23 04/27/2019   PROT 8.2 (H) 04/27/2019   ALBUMIN 5.0 04/27/2019   CALCIUM 9.7 04/27/2019   ANIONGAP 10 04/27/2019   No results found for: CHOL No results found for: HDL No results found for: LDLCALC No results found for: TRIG No results found for: [facility] Lab Results  Component Value Date   HGBA1C 4.2 08/12/2017      Assessment & Plan:   1. Sickle cell anemia without crisis [facility]) He is doing well today. He will continue to take pain medications as prescribed; will continue to avoid extreme heat and cold; will continue to eat a healthy diet and drink at least 64 ounces of water daily; continue stool softener as needed; will avoid colds and flu; will continue to get plenty of sleep and rest; will continue to avoid high stressful situations and remain infection free; will continue Folic Acid 1 mg daily to avoid sickle cell crisis. Continue to follow up with Hematologist as needed.  - POCT urinalysis dipstick  2. Chronic pain syndrome  3. Chronic, continuous use of opioids  4. Nausea and vomiting, intractability of vomiting not specified, unspecified vomiting type Stable.  5. Follow up He will follow up in 2 months.   No orders of the defined types were placed in this encounter.    Orders Placed This Encounter  Procedures  . POCT urinalysis dipstick    Referral Orders  No referral(s) requested today    Kathe Becton,  MSN, FNP-BC [place], [place] [address] [phone]- fax   Problem List Items Addressed This Visit    None    Visit Diagnoses    Sickle cell anemia without crisis ([place])    -  Primary   Relevant Orders   POCT urinalysis dipstick (Completed)   Chronic pain syndrome       Chronic, continuous use of opioids       Nausea and vomiting, intractability of vomiting not specified, unspecified vomiting type       Follow up          No orders of the defined types were placed in this encounter.   Follow-up: No follow-ups on file.    Azzie Glatter, FNP

## 2019-05-10 ENCOUNTER — Other Ambulatory Visit: Payer: Self-pay | Admitting: Family Medicine

## 2019-05-10 DIAGNOSIS — R112 Nausea with vomiting, unspecified: Secondary | ICD-10-CM

## 2019-05-18 ENCOUNTER — Ambulatory Visit: Payer: 59 | Admitting: Family Medicine

## 2019-05-21 ENCOUNTER — Emergency Department (HOSPITAL_COMMUNITY)
Admission: EM | Admit: 2019-05-21 | Discharge: 2019-05-22 | Disposition: A | Discharge status: Home / Self Care | Payer: 59 | Attending: Emergency Medicine | Admitting: Emergency Medicine

## 2019-05-21 ENCOUNTER — Other Ambulatory Visit: Payer: Self-pay

## 2019-05-21 DIAGNOSIS — R112 Nausea with vomiting, unspecified: Secondary | ICD-10-CM | POA: Diagnosis not present

## 2019-05-21 DIAGNOSIS — Z79899 Other long term (current) drug therapy: Secondary | ICD-10-CM | POA: Diagnosis not present

## 2019-05-21 DIAGNOSIS — R52 Pain, unspecified: Secondary | ICD-10-CM | POA: Diagnosis not present

## 2019-05-21 DIAGNOSIS — R197 Diarrhea, unspecified: Secondary | ICD-10-CM | POA: Diagnosis not present

## 2019-05-21 DIAGNOSIS — R1084 Generalized abdominal pain: Secondary | ICD-10-CM | POA: Diagnosis not present

## 2019-05-21 DIAGNOSIS — Z87891 Personal history of nicotine dependence: Secondary | ICD-10-CM | POA: Insufficient documentation

## 2019-05-21 DIAGNOSIS — D5700 Hb-SS disease with crisis, unspecified: Secondary | ICD-10-CM | POA: Diagnosis not present

## 2019-05-21 DIAGNOSIS — K920 Hematemesis: Secondary | ICD-10-CM | POA: Diagnosis not present

## 2019-05-21 DIAGNOSIS — M5489 Other dorsalgia: Secondary | ICD-10-CM | POA: Diagnosis not present

## 2019-05-21 DIAGNOSIS — Z209 Contact with and (suspected) exposure to unspecified communicable disease: Secondary | ICD-10-CM | POA: Diagnosis not present

## 2019-05-21 MED ORDER — SODIUM CHLORIDE 0.9 % IV BOLUS
1000.0000 mL | Freq: Once | INTRAVENOUS | Status: AC
  Administered 2019-05-22: 1000 mL via INTRAVENOUS

## 2019-05-21 MED ORDER — DIPHENHYDRAMINE HCL 50 MG/ML IJ SOLN
25.0000 mg | Freq: Once | INTRAMUSCULAR | Status: AC
  Administered 2019-05-22: 25 mg via INTRAVENOUS
  Filled 2019-05-21: qty 1

## 2019-05-21 MED ORDER — HYDROMORPHONE HCL 1 MG/ML IJ SOLN
1.0000 mg | INTRAMUSCULAR | Status: AC
  Administered 2019-05-22: 1 mg via INTRAVENOUS
  Filled 2019-05-21: qty 1

## 2019-05-21 MED ORDER — HYDROMORPHONE HCL 1 MG/ML IJ SOLN
1.0000 mg | INTRAMUSCULAR | Status: AC
  Administered 2019-05-22: 1 mg via INTRAVENOUS

## 2019-05-21 MED ORDER — HYDROMORPHONE HCL 1 MG/ML IJ SOLN
1.0000 mg | INTRAMUSCULAR | Status: AC
  Administered 2019-05-22: 1 mg via INTRAVENOUS
  Filled 2019-05-21 (×2): qty 1

## 2019-05-21 MED ORDER — SODIUM CHLORIDE 0.45 % IV SOLN
INTRAVENOUS | Status: DC
  Administered 2019-05-22: 01:00:00 via INTRAVENOUS

## 2019-05-21 MED ORDER — PANTOPRAZOLE SODIUM 40 MG IV SOLR
40.0000 mg | Freq: Once | INTRAVENOUS | Status: AC
  Administered 2019-05-22: 40 mg via INTRAVENOUS
  Filled 2019-05-21: qty 40

## 2019-05-21 NOTE — ED Provider Notes (Signed)
[name]-EMERGENCY DEPT Provider Note   CSN: [id number] Arrival date & time: 05/21/19  2258   Time seen 11:15 PM  History   Chief Complaint Chief Complaint  Patient presents with  . Sickle Cell Pain Crisis    HPI [name] is a 28 y.o. male.     HPI patient has a history of sickle cell anemia.  He states a few hours ago he started having nausea with vomiting at least 10 times with some streaks of blood.  He also describes a acid taste to the vomitus.  He complains of upper abdominal pain and he describes it as "mild".  And cramping.  He has low back pain.  He states he has had diarrhea about equal to the same episodes of vomiting.  He denies known fever, chest pain, cough, or shortness of breath.  He denies running out of his medications.  He has not been running but he also is sick.  He denies eating any different foods.  He states he has had episodes like this in the past related to his sickle cell.  PCP Kallie LocksStroud, Natalie M, FNP   Past Medical History:  Diagnosis Date  . Acute maxillary sinusitis   . Eczema   . Sickle cell anemia [facility](HCC)     Patient Active Problem List   Diagnosis Date Noted  . Medication management 03/20/2019  . Bilious vomiting without nausea   . Viral gastroenteritis 01/23/2019  . Nausea vomiting and diarrhea   . Generalized abdominal pain   . Hb-SS disease with crisis (HCC)   . Dehydration   . Sickle cell pain crisis (HCC) 05/18/2018  . Acute sickle cell crisis (HCC) 12/15/2017  . Thrombocytopenia (HCC) 12/15/2017  . Splenomegaly 12/15/2017  . Sickle cell anemia with crisis (HCC) 12/15/2017  . 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 Spleen enlarged 08/18/2017    Past Surgical History:  Procedure Laterality Date  . WISDOM TOOTH EXTRACTION          Home Medications    Prior to Admission medications   Medication Sig Start Date End Date Taking? Authorizing Provider  folic acid (FOLVITE) 1 MG tablet Take 1  tablet (1 mg total) by mouth daily. 01/25/19  Yes Massie MaroonHollis, Lachina M, FNP  Oxycodone HCl 10 MG TABS Take 10 mg by mouth every 6 (six) hours as needed (pain).  04/27/19  Yes [provider]  tetrahydrozoline 0.05 % ophthalmic solution Place 1 drop into both eyes 2 (two) times daily as needed (Red eyes).   Yes [provider]  ibuprofen (ADVIL,MOTRIN) 800 MG tablet Take 1 tablet (800 mg total) by mouth every 8 (eight) hours as needed. Patient not taking: Reported on 03/19/2019 05/10/18   Kallie LocksStroud, Natalie M, FNP  loratadine (CLARITIN) 10 MG tablet Take 1 tablet (10 mg total) by mouth daily. Patient not taking: Reported on 04/25/2019 10/03/18   Kallie LocksStroud, Natalie M, FNP  promethazine (PHENERGAN) 25 MG tablet Take 1 tablet (25 mg total) by mouth every 6 (six) hours as needed for nausea or vomiting. 05/22/19   Devoria AlbeKnapp, Atara Paterson, MD    Family History Family History  Problem Relation Age of Onset  . Sickle cell trait Mother   . Diabetes Father   . Hypertension Father     Social History Social History   Tobacco Use  . Smoking status: Former Smoker    Packs/day: 0.10  . Smokeless tobacco: Never Used  Substance Use Topics  . Alcohol use: Not Currently  Comment: occ  . Drug use: Yes    Types: Marijuana     Allergies   Patient has no known allergies.   Review of Systems Review of Systems  All other systems reviewed and are negative.    Physical Exam Updated Vital Signs BP 125/69   Pulse 82   Temp 97.9 F (36.6 C) (Oral)   Resp 14   Ht  (1.702 m)   Wt 75 kg   SpO2 99%   BMI 25.90 kg/m    Vital signs normal    Physical Exam Vitals signs and nursing note reviewed.  Constitutional:      General: He is in acute distress.     Appearance: Normal appearance. He is well-developed. He is diaphoretic. He is not ill-appearing or toxic-appearing.     Comments: Patient in fetal position in comforter he brought from home  HENT:     Head: Normocephalic and atraumatic.      Right Ear: External ear normal.     Left Ear: External ear normal.     Nose: Nose normal. No mucosal edema or rhinorrhea.     Mouth/Throat:     Mouth: Mucous membranes are dry.     Dentition: No dental abscesses.     Pharynx: Oropharynx is clear. No oropharyngeal exudate, posterior oropharyngeal erythema or uvula swelling.  Eyes:     General: Scleral icterus present.     Extraocular Movements: Extraocular movements intact.     Conjunctiva/sclera: Conjunctivae normal.     Pupils: Pupils are equal, round, and reactive to light.  Neck:     Musculoskeletal: Full passive range of motion without pain, normal range of motion and neck supple.  Cardiovascular:     Rate and Rhythm: Normal rate and regular rhythm.     Heart sounds: Normal heart sounds. No murmur. No friction rub. No gallop.   Pulmonary:     Effort: Pulmonary effort is normal. No respiratory distress.     Breath sounds: Normal breath sounds. No wheezing, rhonchi or rales.  Chest:     Chest wall: No tenderness or crepitus.  Abdominal:     General: Bowel sounds are normal. There is no distension.     Palpations: Abdomen is soft.     Tenderness: There is abdominal tenderness. There is no guarding or rebound.       Comments: Patient has mild diffuse tenderness but he is especially tender in the epigastric area in the upper abdomen.  Musculoskeletal: Normal range of motion.        General: No tenderness.     Comments: Moves all extremities well.   Skin:    General: Skin is warm.     Coloration: Skin is not pale.     Findings: No erythema or rash.  Neurological:     General: No focal deficit present.     Mental Status: He is alert and oriented to person, place, and time.     Cranial Nerves: No cranial nerve deficit.  Psychiatric:        Mood and Affect: Mood normal. Mood is not anxious.        Speech: Speech normal.        Behavior: Behavior normal.        Thought Content: Thought content normal.      ED Treatments /  Results  Labs (all labs ordered are listed, but only abnormal results are displayed) Results for orders placed or performed during the hospital encounter of 05/21/19  CBC WITH DIFFERENTIAL  Result Value Ref Range   WBC 12.7 (H) 4.0 - 10.5 K/uL   RBC 4.43 4.22 - 5.81 MIL/uL   Hemoglobin 12.6 (L) 13.0 - 17.0 g/dL   HCT 32.3 (L) 55.7 - 32.2 %   MCV 79.7 (L) 80.0 - 100.0 fL   MCH 28.4 26.0 - 34.0 pg   MCHC 35.7 30.0 - 36.0 g/dL   RDW 02.5 42.7 - 06.2 %   Platelets 154 150 - 400 K/uL   nRBC 0.2 0.0 - 0.2 %   Neutrophils Relative % 84 %   Neutro Abs 10.6 (H) 1.7 - 7.7 K/uL   Lymphocytes Relative 11 %   Lymphs Abs 1.4 0.7 - 4.0 K/uL   Monocytes Relative 5 %   Monocytes Absolute 0.6 0.1 - 1.0 K/uL   Eosinophils Relative 0 %   Eosinophils Absolute 0.0 0.0 - 0.5 K/uL   Basophils Relative 0 %   Basophils Absolute 0.0 0.0 - 0.1 K/uL   Immature Granulocytes 0 %   Abs Immature Granulocytes 0.05 0.00 - 0.07 K/uL  Comprehensive metabolic panel  Result Value Ref Range   Sodium 138 135 - 145 mmol/L   Potassium 4.3 3.5 - 5.1 mmol/L   Chloride 104 98 - 111 mmol/L   CO2 26 22 - 32 mmol/L   Glucose, Bld 160 (H) 70 - 99 mg/dL   BUN 9 6 - 20 mg/dL   Creatinine, Ser 3.76 0.61 - 1.24 mg/dL   Calcium 9.8 8.9 - 28.3 mg/dL   Total Protein 8.6 (H) 6.5 - 8.1 g/dL   Albumin 5.0 3.5 - 5.0 g/dL   AST 10 (L) 15 - 41 U/L   ALT 20 0 - 44 U/L   Alkaline Phosphatase 53 38 - 126 U/L   Total Bilirubin 1.7 (H) 0.3 - 1.2 mg/dL   GFR calc non Af Amer >60 >60 mL/min   GFR calc Af Amer >60 >60 mL/min   Anion gap 8 5 - 15  Urinalysis, Routine w reflex microscopic  Result Value Ref Range   Color, Urine YELLOW YELLOW   APPearance HAZY (A) CLEAR   Specific Gravity, Urine 1.020 1.005 - 1.030   pH 8.0 5.0 - 8.0   Glucose, UA 50 (A) NEGATIVE mg/dL   Hgb urine dipstick NEGATIVE NEGATIVE   Bilirubin Urine NEGATIVE NEGATIVE   Ketones, ur NEGATIVE NEGATIVE mg/dL   Protein, ur NEGATIVE NEGATIVE mg/dL   Nitrite  NEGATIVE NEGATIVE   Leukocytes,Ua NEGATIVE NEGATIVE  Reticulocytes  Result Value Ref Range   Retic Ct Pct 4.2 (H) 0.4 - 3.1 %   RBC. 4.43 4.22 - 5.81 MIL/uL   Retic Count, Absolute 185.2 19.0 - 186.0 K/uL   Immature Retic Fract 25.7 (H) 2.3 - 15.9 %  Lactic acid, plasma  Result Value Ref Range   Lactic Acid, Venous 1.7 0.5 - 1.9 mmol/L  Lactic acid, plasma  Result Value Ref Range   Lactic Acid, Venous 0.9 0.5 - 1.9 mmol/L   Laboratory interpretation all normal except stable anemia, stable leukocytosis, stable elevation of his reticulocytes    EKG None  Radiology No results found.  Procedures Procedures (including critical care time)  Medications Ordered in ED Medications  0.45 % sodium chloride infusion ( Intravenous New Bag/Given 05/22/19 0129)  oxyCODONE-acetaminophen (PERCOCET/ROXICET) 5-325 MG per tablet 2 tablet (has no administration in time range)  HYDROmorphone (DILAUDID) injection 1 mg (1 mg Intravenous Given 05/22/19 0016)  HYDROmorphone (DILAUDID) injection 1 mg (1 mg Intravenous Given  05/22/19 0227)  HYDROmorphone (DILAUDID) injection 1 mg (1 mg Intravenous Given 05/22/19 0130)  diphenhydrAMINE (BENADRYL) injection 25 mg (25 mg Intravenous Given 05/22/19 0015)  sodium chloride 0.9 % bolus 1,000 mL (0 mLs Intravenous Stopped 05/22/19 0230)  sodium chloride 0.9 % bolus 1,000 mL (0 mLs Intravenous Stopped 05/22/19 0132)  pantoprazole (PROTONIX) injection 40 mg (40 mg Intravenous Given 05/22/19 0015)     Initial Impression / Assessment and Plan / ED Course  I have reviewed the triage vital signs and the nursing notes.  Pertinent labs & imaging results that were available during my care of the patient were reviewed by me and considered in my medical decision making (see chart for details).    Patient was started on IV fluids, he was given extra fluids because of the vomiting and diarrhea.  He was started on sickle cell protocol for IV Dilaudid.    Recheck at  1:30 AM patient was sleeping, he states he still having some pain.  It appears he has only had 1 dose of his pain medication he was reminded he had a total of 3 that he could ask for 2 more.  Recheck at 4 AM patient is no longer in the fetal position, he sitting in the stretcher and looks like he is in no distress.  He states he has drank 2 containers of ginger ale.  We discussed going home however he states he still has some back pain.  He was given oral Percocet.  We discussed going home but he states he needs to wait till his wife gets off work, patient can go home anyway.  He states he is concerned that he will go home and start vomiting again.  He has been drinking fluids in the ER with no problem, I will send him home with some nausea medication.  At this point patient looks much improved and I feel he is ready to go home.  Final Clinical Impressions(s) / ED Diagnoses   Final diagnoses:  Sickle cell pain crisis ([place])  Nausea vomiting and diarrhea    ED Discharge Orders         Ordered    promethazine (PHENERGAN) 25 MG tablet  Every 6 hours PRN     05/22/19 0414         Plan discharge  Rolland Porter, MD, Barbette Or, MD 05/22/19 [phone]

## 2019-05-21 NOTE — ED Triage Notes (Signed)
Pt presents to the ED via [facility] coming from home. Pt c/o N/V/D and abdominal x1 day. Hx of Sickle Cell and states this feels similar just not usually with diarrhea. EMS started and IV and gave 4mg  of zofran en route with relief.

## 2019-05-22 DIAGNOSIS — Z87891 Personal history of nicotine dependence: Secondary | ICD-10-CM | POA: Diagnosis not present

## 2019-05-22 DIAGNOSIS — R197 Diarrhea, unspecified: Secondary | ICD-10-CM | POA: Diagnosis not present

## 2019-05-22 DIAGNOSIS — R112 Nausea with vomiting, unspecified: Secondary | ICD-10-CM | POA: Diagnosis not present

## 2019-05-22 DIAGNOSIS — Z79899 Other long term (current) drug therapy: Secondary | ICD-10-CM | POA: Diagnosis not present

## 2019-05-22 DIAGNOSIS — D5700 Hb-SS disease with crisis, unspecified: Secondary | ICD-10-CM | POA: Diagnosis not present

## 2019-05-22 LAB — RETICULOCYTES
Immature Retic Fract: 25.7 % — ABNORMAL HIGH (ref 2.3–15.9)
RBC.: 4.43 MIL/uL (ref 4.22–5.81)
Retic Count, Absolute: 185.2 K/uL (ref 19.0–186.0)
Retic Ct Pct: 4.2 % — ABNORMAL HIGH (ref 0.4–3.1)

## 2019-05-22 LAB — URINALYSIS, ROUTINE W REFLEX MICROSCOPIC
Bilirubin Urine: NEGATIVE
Glucose, UA: 50 mg/dL — AB
Hgb urine dipstick: NEGATIVE
Ketones, ur: NEGATIVE mg/dL
Leukocytes,Ua: NEGATIVE
Nitrite: NEGATIVE
Protein, ur: NEGATIVE mg/dL
Specific Gravity, Urine: 1.02 (ref 1.005–1.030)
pH: 8 (ref 5.0–8.0)

## 2019-05-22 LAB — CBC WITH DIFFERENTIAL/PLATELET
Abs Immature Granulocytes: 0.05 10*3/uL (ref 0.00–0.07)
Basophils Absolute: 0 10*3/uL (ref 0.0–0.1)
Basophils Relative: 0 %
Eosinophils Absolute: 0 10*3/uL (ref 0.0–0.5)
Eosinophils Relative: 0 %
HCT: 35.3 % — ABNORMAL LOW (ref 39.0–52.0)
Hemoglobin: 12.6 g/dL — ABNORMAL LOW (ref 13.0–17.0)
Immature Granulocytes: 0 %
Lymphocytes Relative: 11 %
Lymphs Abs: 1.4 10*3/uL (ref 0.7–4.0)
MCH: 28.4 pg (ref 26.0–34.0)
MCHC: 35.7 g/dL (ref 30.0–36.0)
MCV: 79.7 fL — ABNORMAL LOW (ref 80.0–100.0)
Monocytes Absolute: 0.6 10*3/uL (ref 0.1–1.0)
Monocytes Relative: 5 %
Neutro Abs: 10.6 10*3/uL — ABNORMAL HIGH (ref 1.7–7.7)
Neutrophils Relative %: 84 %
Platelets: 154 10*3/uL (ref 150–400)
RBC: 4.43 MIL/uL (ref 4.22–5.81)
RDW: 14.4 % (ref 11.5–15.5)
WBC: 12.7 10*3/uL — ABNORMAL HIGH (ref 4.0–10.5)
nRBC: 0.2 % (ref 0.0–0.2)

## 2019-05-22 LAB — COMPREHENSIVE METABOLIC PANEL
ALT: 20 U/L (ref 0–44)
AST: 10 U/L — ABNORMAL LOW (ref 15–41)
Albumin: 5 g/dL (ref 3.5–5.0)
Alkaline Phosphatase: 53 U/L (ref 38–126)
Anion gap: 8 (ref 5–15)
BUN: 9 mg/dL (ref 6–20)
CO2: 26 mmol/L (ref 22–32)
Calcium: 9.8 mg/dL (ref 8.9–10.3)
Chloride: 104 mmol/L (ref 98–111)
Creatinine, Ser: 0.92 mg/dL (ref 0.61–1.24)
GFR calc Af Amer: >60 mL/min (ref >60)
GFR calc non Af Amer: >60 mL/min (ref >60)
Glucose, Bld: 160 mg/dL — ABNORMAL HIGH (ref 70–99)
Potassium: 4.3 mmol/L (ref 3.5–5.1)
Sodium: 138 mmol/L (ref 135–145)
Total Bilirubin: 1.7 mg/dL — ABNORMAL HIGH (ref 0.3–1.2)
Total Protein: 8.6 g/dL — ABNORMAL HIGH (ref 6.5–8.1)

## 2019-05-22 LAB — LACTIC ACID, PLASMA
Lactic Acid, Venous: 1.7 mmol/L (ref 0.5–1.9)
Lactic Acid, Venous: 0.9 mmol/L (ref 0.5–1.9)

## 2019-05-22 MED ORDER — PROMETHAZINE HCL 25 MG PO TABS: 25.0000 mg | ORAL_TABLET | Freq: Four times a day (QID) | ORAL | 0 refills | Status: DC | PRN

## 2019-05-22 MED ORDER — OXYCODONE-ACETAMINOPHEN 5-325 MG PO TABS
2.0000 | ORAL_TABLET | Freq: Once | ORAL | Status: AC
  Administered 2019-05-22: 2 via ORAL
  Filled 2019-05-22: qty 2

## 2019-05-22 NOTE — Discharge Instructions (Addendum)
Drink plenty of fluids (clear liquids) then start a bland diet later this morning such as toast, crackers, jello, [employer] chicken noodle soup. Use the phenergan for nausea or vomiting. Take imodium OTC for diarrhea. Avoid milk products until the diarrhea is gone.  Follow-up with your sickle cell doctors as needed.

## 2019-05-25 ENCOUNTER — Telehealth: Payer: Self-pay

## 2019-05-25 ENCOUNTER — Other Ambulatory Visit: Payer: Self-pay | Admitting: Family Medicine

## 2019-05-25 DIAGNOSIS — G894 Chronic pain syndrome: Secondary | ICD-10-CM

## 2019-05-25 DIAGNOSIS — D571 Sickle-cell disease without crisis: Secondary | ICD-10-CM

## 2019-05-25 DIAGNOSIS — F1190 Opioid use, unspecified, uncomplicated: Secondary | ICD-10-CM

## 2019-05-25 MED ORDER — OXYCODONE HCL 10 MG PO TABS: 10.0000 mg | ORAL_TABLET | Freq: Four times a day (QID) | ORAL | 0 refills | Status: DC | PRN

## 2019-05-25 NOTE — Telephone Encounter (Signed)
Called and advised that rx was sent to pharmacy. Thanks!

## 2019-05-25 NOTE — Telephone Encounter (Signed)
Refill request for oxycodone. patient states he will run out tomorrow.

## 2019-06-06 ENCOUNTER — Telehealth: Payer: Self-pay | Admitting: Family Medicine

## 2019-06-07 ENCOUNTER — Other Ambulatory Visit: Payer: Self-pay | Admitting: Family Medicine

## 2019-06-07 DIAGNOSIS — D571 Sickle-cell disease without crisis: Secondary | ICD-10-CM

## 2019-06-07 DIAGNOSIS — F1190 Opioid use, unspecified, uncomplicated: Secondary | ICD-10-CM

## 2019-06-07 DIAGNOSIS — G894 Chronic pain syndrome: Secondary | ICD-10-CM

## 2019-06-07 MED ORDER — OXYCODONE HCL 10 MG PO TABS: 10.0000 mg | ORAL_TABLET | Freq: Four times a day (QID) | ORAL | 0 refills | Status: DC | PRN

## 2019-06-07 NOTE — Telephone Encounter (Signed)
Msg left about Rx availability.

## 2019-06-08 ENCOUNTER — Other Ambulatory Visit: Payer: Self-pay | Admitting: Family Medicine

## 2019-06-08 ENCOUNTER — Telehealth: Payer: Self-pay | Admitting: Family Medicine

## 2019-06-08 DIAGNOSIS — G894 Chronic pain syndrome: Secondary | ICD-10-CM

## 2019-06-08 DIAGNOSIS — D571 Sickle-cell disease without crisis: Secondary | ICD-10-CM

## 2019-06-08 DIAGNOSIS — F1190 Opioid use, unspecified, uncomplicated: Secondary | ICD-10-CM

## 2019-06-08 MED ORDER — OXYCODONE HCL 10 MG PO TABS: 10.0000 mg | ORAL_TABLET | Freq: Four times a day (QID) | ORAL | 0 refills | Status: DC | PRN

## 2019-06-08 NOTE — Telephone Encounter (Signed)
Called and spoke with Lanelle Bal at [facility] and canceled rx.  Called Patient and informed that rx has been sent to [facility] on [address]. Thanks!

## 2019-06-25 ENCOUNTER — Other Ambulatory Visit: Payer: Self-pay | Admitting: Family Medicine

## 2019-06-25 ENCOUNTER — Telehealth: Payer: Self-pay

## 2019-06-25 DIAGNOSIS — R112 Nausea with vomiting, unspecified: Secondary | ICD-10-CM

## 2019-06-25 DIAGNOSIS — F1190 Opioid use, unspecified, uncomplicated: Secondary | ICD-10-CM

## 2019-06-25 DIAGNOSIS — D571 Sickle-cell disease without crisis: Secondary | ICD-10-CM

## 2019-06-25 DIAGNOSIS — G894 Chronic pain syndrome: Secondary | ICD-10-CM

## 2019-06-25 MED ORDER — PROMETHAZINE HCL 25 MG PO TABS: 25.0000 mg | ORAL_TABLET | Freq: Four times a day (QID) | ORAL | 0 refills | Status: DC | PRN

## 2019-06-25 MED ORDER — OXYCODONE HCL 10 MG PO TABS: 10.0000 mg | ORAL_TABLET | Freq: Four times a day (QID) | ORAL | 0 refills | Status: DC | PRN

## 2019-06-25 NOTE — Telephone Encounter (Signed)
Refill request for oxycodone and promethazine. Please send to [facility].

## 2019-06-25 NOTE — Telephone Encounter (Signed)
Called and made patient aware. Thanks!  

## 2019-07-06 ENCOUNTER — Telehealth (HOSPITAL_COMMUNITY): Payer: Self-pay | Admitting: *Deleted

## 2019-07-06 ENCOUNTER — Encounter (HOSPITAL_COMMUNITY): Payer: Self-pay | Admitting: Family Medicine

## 2019-07-06 ENCOUNTER — Non-Acute Institutional Stay (HOSPITAL_COMMUNITY)
Admission: AD | Admit: 2019-07-06 | Discharge: 2019-07-06 | Disposition: A | Discharge status: Home / Self Care | Payer: 59 | Source: Ambulatory Visit | Attending: Internal Medicine | Admitting: Internal Medicine

## 2019-07-06 DIAGNOSIS — M545 Low back pain: Secondary | ICD-10-CM | POA: Diagnosis present

## 2019-07-06 DIAGNOSIS — Z87891 Personal history of nicotine dependence: Secondary | ICD-10-CM | POA: Diagnosis not present

## 2019-07-06 DIAGNOSIS — D5700 Hb-SS disease with crisis, unspecified: Secondary | ICD-10-CM | POA: Diagnosis not present

## 2019-07-06 MED ORDER — SODIUM CHLORIDE 0.9% FLUSH: 9.0000 mL | INTRAVENOUS | Status: DC | PRN

## 2019-07-06 MED ORDER — SODIUM CHLORIDE 0.9 % IV SOLN
25.0000 mg | INTRAVENOUS | Status: DC | PRN
  Filled 2019-07-06: qty 0.5

## 2019-07-06 MED ORDER — HYDROMORPHONE 1 MG/ML IV SOLN
INTRAVENOUS | Status: DC
  Administered 2019-07-06: 3.5 mg via INTRAVENOUS
  Administered 2019-07-06: 30 mg via INTRAVENOUS
  Filled 2019-07-06: qty 30

## 2019-07-06 MED ORDER — KETOROLAC TROMETHAMINE 30 MG/ML IJ SOLN
15.0000 mg | Freq: Once | INTRAMUSCULAR | Status: AC
  Administered 2019-07-06: 15 mg via INTRAVENOUS
  Filled 2019-07-06: qty 1

## 2019-07-06 MED ORDER — ONDANSETRON HCL 4 MG/2ML IJ SOLN: 4.0000 mg | Freq: Four times a day (QID) | INTRAMUSCULAR | Status: DC | PRN

## 2019-07-06 MED ORDER — SODIUM CHLORIDE 0.45 % IV SOLN
INTRAVENOUS | Status: DC
  Administered 2019-07-06: 12:00:00 via INTRAVENOUS

## 2019-07-06 MED ORDER — DIPHENHYDRAMINE HCL 25 MG PO CAPS: 25.0000 mg | ORAL_CAPSULE | ORAL | Status: DC | PRN

## 2019-07-06 MED ORDER — NALOXONE HCL 0.4 MG/ML IJ SOLN: 0.4000 mg | INTRAMUSCULAR | Status: DC | PRN

## 2019-07-06 MED ORDER — ACETAMINOPHEN 500 MG PO TABS
1000.0000 mg | ORAL_TABLET | Freq: Once | ORAL | Status: AC
  Administered 2019-07-06: 1000 mg via ORAL
  Filled 2019-07-06: qty 2

## 2019-07-06 NOTE — Progress Notes (Signed)
Patient admitted to the day infusion hospital for sickle cell pain. Initially, patient reported lower back pain rated 8/10. For pain management, patient placed on Dilaudid PCA, given 15 mg Toradol, 1000 mg Tylenol and hydrated with IV fluids. At discharge, patient rated pain at 3/10. Vital signs stable. Discharge instructions given. Patient alert, oriented and ambulatory at discharge.   

## 2019-07-06 NOTE — H&P (Addendum)
[facility] History and Physical   Date: 07/06/2019  Patient name: [name] Medical record number: [id number] Date of birth: May 20, 1991 Age: 28 y.o. Gender: male PCP: Kallie Locks, FNP  Attending physician: Quentin Angst, MD  Chief Complaint: Sickle cell disease  History of Present Illness: Austin [name], a 28 year old male with a medical history significant for sickle cell disease, chronic pain syndrome, opiate dependence and tolerance, and anemia of chronic disease presents complaining of low back and lower extremity pain that is consistent with his typical pain crisis.  Patient states that pain intensity increased this a.m. and has not improved on home medications.  He last had oxycodone at that time without sustained relief.  He attributes pain crisis to moving into a new home, he mostly moved himself with minimal assistance.  He says that he felt as if he was going to go into crisis on yesterday, but persevered.  Current pain intensity is 10/10 characterized as constant, throbbing, and occasionally sharp.  Patient mostly manages pain at home with oxycodone and ibuprofen.  He has infrequent admissions for sickle cell crisis.  Patient's last sickle cell crisis was greater than 6 months ago.  He currently denies headache, chest pain, shortness of breath, persistent cough, no urinary symptoms, nausea, vomiting, or diarrhea.  Denies sick contacts, recent travel, or exposure to COVID-19.   Meds: Medications Prior to Admission  Medication Sig Dispense Refill Last Dose  . folic acid (FOLVITE) 1 MG tablet Take 1 tablet (1 mg total) by mouth daily. 30 tablet 11   . ibuprofen (ADVIL,MOTRIN) 800 MG tablet Take 1 tablet (800 mg total) by mouth every 8 (eight) hours as needed. (Patient not taking: Reported on 03/19/2019) 30 tablet 1   . loratadine (CLARITIN) 10 MG tablet Take 1 tablet (10 mg total) by mouth daily. (Patient not taking: Reported on 04/25/2019) 30  tablet 6   . Oxycodone HCl 10 MG TABS Take 1 tablet (10 mg total) by mouth every 6 (six) hours as needed (pain). 60 tablet 0   . promethazine (PHENERGAN) 25 MG tablet Take 1 tablet (25 mg total) by mouth every 6 (six) hours as needed for nausea or vomiting. 30 tablet 0   . tetrahydrozoline 0.05 % ophthalmic solution Place 1 drop into both eyes 2 (two) times daily as needed (Red eyes).       Allergies: Patient has no known allergies. Past Medical History:  Diagnosis Date  . Acute maxillary sinusitis   . Eczema   . Sickle cell anemia (HCC)    Past Surgical History:  Procedure Laterality Date  . WISDOM TOOTH EXTRACTION     Family History  Problem Relation Age of Onset  . Sickle cell trait Mother   . Diabetes Father   . Hypertension Father    Social History   Socioeconomic History  . Marital status: Married    Spouse name: Not on file  . Number of children: Not on file  . Years of education: Not on file  . Highest education level: Not on file  Occupational History  . Not on file  Tobacco Use  . Smoking status: Former Smoker    Packs/day: 0.10  . Smokeless tobacco: Never Used  Substance and Sexual Activity  . Alcohol use: Not Currently    Comment: occ  . Drug use: Yes    Types: Marijuana  . Sexual activity: Not on file  Other Topics Concern  . Not on file  Social History Narrative  .  Not on file   Social Determinants of Health   Financial Resource Strain:   . Difficulty of Paying Living Expenses: Not on file  Food Insecurity:   . Worried About Charity fundraiser in the Last Year: Not on file  . Ran Out of Food in the Last Year: Not on file  Transportation Needs:   . Lack of Transportation (Medical): Not on file  . Lack of Transportation (Non-Medical): Not on file  Physical Activity:   . Days of Exercise per Week: Not on file  . Minutes of Exercise per Session: Not on file  Stress:   . Feeling of Stress : Not on file  Social Connections:   . Frequency of  Communication with Friends and Family: Not on file  . Frequency of Social Gatherings with Friends and Family: Not on file  . Attends Religious Services: Not on file  . Active Member of Clubs or Organizations: Not on file  . Attends Archivist Meetings: Not on file  . Marital Status: Not on file  Intimate Partner Violence:   . Fear of Current or Ex-Partner: Not on file  . Emotionally Abused: Not on file  . Physically Abused: Not on file  . Sexually Abused: Not on file   Review of Systems  Constitutional: Negative.  Negative for chills and fever.  HENT: Negative.   Eyes: Negative.   Respiratory: Negative.   Cardiovascular: Negative.   Gastrointestinal: Negative.   Genitourinary: Negative.   Musculoskeletal: Positive for back pain and joint pain.  Skin: Negative.   Neurological: Negative.  Negative for dizziness and headaches.  Psychiatric/Behavioral: Negative.     Physical Exam: There were no vitals taken for this visit. Physical Exam Constitutional:      Appearance: Normal appearance.  Eyes:     General: No scleral icterus.    Pupils: Pupils are equal, round, and reactive to light.  Cardiovascular:     Rate and Rhythm: Normal rate and regular rhythm.     Pulses: Normal pulses.  Pulmonary:     Effort: Pulmonary effort is normal.     Breath sounds: Normal breath sounds.  Abdominal:     General: Bowel sounds are normal.     Palpations: Abdomen is soft.  Musculoskeletal:        General: Normal range of motion.  Skin:    General: Skin is warm.  Neurological:     General: No focal deficit present.     Mental Status: He is alert. Mental status is at baseline.  Psychiatric:        Mood and Affect: Mood normal.        Behavior: Behavior normal.        Thought Content: Thought content normal.        Judgment: Judgment normal.      Lab results: No results found for this or any previous visit (from the past 24 hour(s)).  Imaging results:  No results  found.   Assessment & Plan: Patient admitted to sickle cell day infusion center for management of pain crisis.  He is opiate tolerant.  Initiated IV Dilaudid PCA with settings of 0.5 mg, 10-minute lockout, and 3 mg/h. IV fluid, 0.45% saline at 100 mL/h Toradol 15 mg IV x1 dose Tylenol 1000 mg x 1 dose Review CBC with differential, reticulocytes, and complete metabolic panel as results become available.  Review patient's charts, last labs were in October 2020, and were consistent with patient's baseline. Pain intensity will be  reevaluated in context of functioning in relationship to baseline as his care progresses. If pain intensity remains elevated, and/or hemodynamic stability changes.  Admit to inpatient services for higher level of care.   Nolon NationsLachina Moore Dannilynn Gallina  APRN, MSN, FNP-C [facility] [address]  [place], [place] [id number] [phone]  07/06/2019, 11:45 AM   This note was prepared using Dragon speech recognition software, errors in dictation are unintentional.

## 2019-07-06 NOTE — Telephone Encounter (Signed)
Patient called requesting to come to the day hospital for sickle cell pain. Patient reports lower back pain rated 8/10. Reports taking Oxycodone for pain at 4:00 am. COVID-19 screening done and patient denies all symptoms. Denies fever, chest pain, nausea, vomiting, diarrhea, abdominal pain and priapism. Admits to having transportation at discharge without driving self. [place], [place] notified. Patient can come to the day hospital for pain management. Patient advised and expresses an understanding.

## 2019-07-06 NOTE — Discharge Instructions (Signed)
Sickle Cell Anemia, Adult ° °Sickle cell anemia is a condition where your red blood cells are shaped like sickles. Red blood cells carry oxygen through the body. Sickle-shaped cells do not live as long as normal red blood cells. They also clump together and block blood from flowing through the blood vessels. This prevents the body from getting enough oxygen. Sickle cell anemia causes organ damage and pain. It also increases the risk of infection. °Follow these instructions at home: °Medicines °· Take over-the-counter and prescription medicines only as told by your doctor. °· If you were prescribed an antibiotic medicine, take it as told by your doctor. Do not stop taking the antibiotic even if you start to feel better. °· If you develop a fever, do not take medicines to lower the fever right away. Tell your doctor about the fever. °Managing pain, stiffness, and swelling °· Try these methods to help with pain: °? Use a heating pad. °? Take a warm bath. °? Distract yourself, such as by watching TV. °Eating and drinking °· Drink enough fluid to keep your pee (urine) clear or pale yellow. Drink more in hot weather and during exercise. °· Limit or avoid alcohol. °· Eat a healthy diet. Eat plenty of fruits, vegetables, whole grains, and lean protein. °· Take vitamins and supplements as told by your doctor. °Traveling °· When traveling, keep these with you: °? Your medical information. °? The names of your doctors. °? Your medicines. °· If you need to take an airplane, talk to your doctor first. °Activity °· Rest often. °· Avoid exercises that make your heart beat much faster, such as jogging. °General instructions °· Do not use products that have nicotine or tobacco, such as cigarettes and e-cigarettes. If you need help quitting, ask your doctor. °· Consider wearing a medical alert bracelet. °· Avoid being in high places (high altitudes), such as mountains. °· Avoid very hot or cold temperatures. °· Avoid places where the  temperature changes a lot. °· Keep all follow-up visits as told by your doctor. This is important. °Contact a doctor if: °· A joint hurts. °· Your feet or hands hurt or swell. °· You feel tired (fatigued). °Get help right away if: °· You have symptoms of infection. These include: °? Fever. °? Chills. °? Being very tired. °? Irritability. °? Poor eating. °? Throwing up (vomiting). °· You feel dizzy or faint. °· You have new stomach pain, especially on the left side. °· You have a an erection (priapism) that lasts more than 4 hours. °· You have numbness in your arms or legs. °· You have a hard time moving your arms or legs. °· You have trouble talking. °· You have pain that does not go away when you take medicine. °· You are short of breath. °· You are breathing fast. °· You have a long-term cough. °· You have pain in your chest. °· You have a bad headache. °· You have a stiff neck. °· Your stomach looks bloated even though you did not eat much. °· Your skin is pale. °· You suddenly cannot see well. °Summary °· Sickle cell anemia is a condition where your red blood cells are shaped like sickles. °· Follow your doctor's advice on ways to manage pain, food to eat, activities to do, and steps to take for safe travel. °· Get medical help right away if you have any signs of infection, such as a fever. °This information is not intended to replace advice given to you by   your health care provider. Make sure you discuss any questions you have with your health care provider. °Document Released: 05/02/2013 Document Revised: 11/03/2018 Document Reviewed: 08/17/2016 °Elsevier Patient Education © 2020 [employer] ° °

## 2019-07-08 NOTE — Discharge Summary (Signed)
[facility] Discharge Summary   Patient ID: Austin [name] MRN: [id number] DOB/AGE: 08-13-1990 28 y.o.  Admit date: 07/06/2019 Discharge date: 07/08/2019  Primary Care Physician:  Azzie Glatter, FNP  Admission Diagnoses:  Active Problems:   Sickle cell pain crisis [facility])   Discharge Medications:  Allergies as of 07/06/2019   No Known Allergies     Medication List    TAKE these medications   folic acid 1 MG tablet Commonly known as: FOLVITE Take 1 tablet (1 mg total) by mouth daily.   ibuprofen 800 MG tablet Commonly known as: ADVIL Take 1 tablet (800 mg total) by mouth every 8 (eight) hours as needed.   loratadine 10 MG tablet Commonly known as: CLARITIN Take 1 tablet (10 mg total) by mouth daily.   Oxycodone HCl 10 MG Tabs Take 1 tablet (10 mg total) by mouth every 6 (six) hours as needed (pain).   promethazine 25 MG tablet Commonly known as: PHENERGAN Take 1 tablet (25 mg total) by mouth every 6 (six) hours as needed for nausea or vomiting.   tetrahydrozoline 0.05 % ophthalmic solution Place 1 drop into both eyes 2 (two) times daily as needed (Red eyes).        Consults:  None  Significant Diagnostic Studies:  No results found. History of present illness: Austin [name], a 28 year old male with a medical history significant for sickle cell disease, chronic pain syndrome, opiate dependence and tolerance, and anemia of chronic disease presents complaining of low back and lower extremity pain that is consistent with his typical pain crisis.  Patient states that pain intensity increased this a.m. and has not improved on home medications.  He last had oxycodone at that time without sustained relief.  He attributes pain crisis to moving into a new home, he mostly moved himself with minimal assistance.  He says that he felt as if he was going to go into crisis on yesterday, but persevered.  Current pain intensity is 10/10 characterized as  constant, throbbing, and occasionally sharp.  Patient mostly manages pain at home with oxycodone and ibuprofen.  He has infrequent admissions for sickle cell crisis.  Patient's last sickle cell crisis was greater than 6 months ago.  He currently denies headache, chest pain, shortness of breath, persistent cough, no urinary symptoms, nausea, vomiting, or diarrhea.  Denies sick contacts, recent travel, or exposure to COVID-19.  [facility] Course: Austin [name] was admitted to sickle cell day infusion center for management of pain crisis. Unable to obtain labs due to poor venous access.  Patient is ready for discharge, does not have time to wait for another lab draw. Pain managed with IV Dilaudid PCA per weight-based protocol.  Settings of 0.5 mg, 10-minute lockout, and 3 mg/h.  Patient used a total of 3.5 mg with 7 demands and 7 delivered. Toradol 15 mg IV x1 dose Tylenol 1000 mg x 1 dose IV fluids, 0.45% saline at 100 mL/h Patient says that pain intensity has decreased to 3/10.  He feels that he can manage at home on current medication regimen.  He does not warrant admission at this time. Patient alert, oriented, and ambulating without assistance. He will discharge home with family in a hemodynamically stable condition.  Discharge instructions: Resume all home medications.  Follow up with PCP as previously  scheduled.   Discussed the importance of drinking 64 ounces of water daily to  help prevent pain crises, it is important to drink plenty of  water throughout the day. This is because dehydration of red blood cells may lead further sickling.   Avoid all stressors that precipitate sickle cell pain crisis.     The patient was given clear instructions to go to ER or return to medical center if symptoms do not improve, worsen or new problems develop.    Physical Exam at Discharge:  BP 114/74 (BP Location: Right Arm)   Pulse 80   Temp 97.6 F (36.4 C) (Oral)   Resp 12    SpO2 99%   Physical Exam Constitutional:      Appearance: Normal appearance.  HENT:     Nose: Nose normal.  Eyes:     Pupils: Pupils are equal, round, and reactive to light.  Cardiovascular:     Rate and Rhythm: Normal rate and regular rhythm.  Pulmonary:     Effort: Pulmonary effort is normal.     Breath sounds: Normal breath sounds.  Abdominal:     General: Abdomen is flat. Bowel sounds are normal.  Musculoskeletal:        General: Normal range of motion.  Skin:    General: Skin is warm.  Neurological:     General: No focal deficit present.     Mental Status: He is alert. Mental status is at baseline.  Psychiatric:        Mood and Affect: Mood normal.        Thought Content: Thought content normal.        Judgment: Judgment normal.     Disposition at Discharge: Discharge disposition: 01-Home or Self Care       Discharge Orders: Discharge Instructions    Discharge patient   Complete by: As directed    Discharge disposition: 01-Home or Self Care   Discharge patient date: 07/06/2019      Condition at Discharge:   Stable  Time spent on Discharge:  Greater than 30 minutes.  Signed:  Nolon Nations  APRN, MSN, FNP-C Patient Care [facility] [address] [place], [place] [address] [phone]  07/08/2019, 11:22 AM   This note was prepared using Dragon speech recognition software, errors in dictation are unintentional.

## 2019-07-09 ENCOUNTER — Telehealth: Payer: Self-pay

## 2019-07-09 ENCOUNTER — Ambulatory Visit: Payer: 59 | Admitting: Family Medicine

## 2019-07-09 ENCOUNTER — Other Ambulatory Visit: Payer: Self-pay | Admitting: Family Medicine

## 2019-07-09 DIAGNOSIS — D571 Sickle-cell disease without crisis: Secondary | ICD-10-CM

## 2019-07-09 DIAGNOSIS — F1190 Opioid use, unspecified, uncomplicated: Secondary | ICD-10-CM

## 2019-07-09 DIAGNOSIS — G894 Chronic pain syndrome: Secondary | ICD-10-CM

## 2019-07-09 DIAGNOSIS — R110 Nausea: Secondary | ICD-10-CM

## 2019-07-09 MED ORDER — OXYCODONE HCL 10 MG PO TABS: 10.0000 mg | ORAL_TABLET | Freq: Four times a day (QID) | ORAL | 0 refills | Status: DC | PRN

## 2019-07-09 MED ORDER — ONDANSETRON HCL 4 MG PO TABS: 4.0000 mg | ORAL_TABLET | Freq: Three times a day (TID) | ORAL | 3 refills | Status: DC | PRN

## 2019-07-09 NOTE — Telephone Encounter (Signed)
Refill request for oxycodone and promethazine. [facility]. Thanks!

## 2019-07-09 NOTE — Telephone Encounter (Signed)
Called and informed patient that oxycodone has been sent to pharmacy and that zofran has been sent in due to promethazine being too early. Thanks!

## 2019-07-23 ENCOUNTER — Other Ambulatory Visit: Payer: Self-pay

## 2019-07-23 ENCOUNTER — Ambulatory Visit (INDEPENDENT_AMBULATORY_CARE_PROVIDER_SITE_OTHER): Payer: 59 | Admitting: Family Medicine

## 2019-07-23 DIAGNOSIS — Z09 Encounter for follow-up examination after completed treatment for conditions other than malignant neoplasm: Secondary | ICD-10-CM | POA: Diagnosis not present

## 2019-07-23 DIAGNOSIS — R112 Nausea with vomiting, unspecified: Secondary | ICD-10-CM | POA: Diagnosis not present

## 2019-07-23 DIAGNOSIS — F1190 Opioid use, unspecified, uncomplicated: Secondary | ICD-10-CM

## 2019-07-23 DIAGNOSIS — G894 Chronic pain syndrome: Secondary | ICD-10-CM

## 2019-07-23 DIAGNOSIS — D571 Sickle-cell disease without crisis: Secondary | ICD-10-CM

## 2019-07-23 DIAGNOSIS — R110 Nausea: Secondary | ICD-10-CM

## 2019-07-23 MED ORDER — OXYCODONE HCL 10 MG PO TABS: 10.0000 mg | ORAL_TABLET | Freq: Four times a day (QID) | ORAL | 0 refills | Status: DC | PRN

## 2019-07-23 MED ORDER — PROMETHAZINE HCL 25 MG PO TABS: 25.0000 mg | ORAL_TABLET | Freq: Four times a day (QID) | ORAL | 0 refills | Status: DC | PRN

## 2019-07-23 NOTE — Progress Notes (Signed)
Virtual Visit via Telephone Note  I connected with Austin [name] on 07/23/19 at  3:00 PM EST by telephone and verified that I am speaking with the correct person using two identifiers.   I discussed the limitations, risks, security and privacy concerns of performing an evaluation and management service by telephone and the availability of in person appointments. I also discussed with the patient that there may be a patient responsible charge related to this service. The patient expressed understanding and agreed to proceed.   History of Present Illness:  Past Medical History:  Diagnosis Date  . Acute maxillary sinusitis   . Eczema   . Sickle cell anemia (HCC)    Family History  Problem Relation Age of Onset  . Sickle cell trait Mother   . Diabetes Father   . Hypertension Father     Social History   Socioeconomic History  . Marital status: Married    Spouse name: Not on file  . Number of children: Not on file  . Years of education: Not on file  . Highest education level: Not on file  Occupational History  . Not on file  Tobacco Use  . Smoking status: Former Smoker    Packs/day: 0.10  . Smokeless tobacco: Never Used  Substance and Sexual Activity  . Alcohol use: Not Currently    Comment: occ  . Drug use: Yes    Types: Marijuana  . Sexual activity: Not on file  Other Topics Concern  . Not on file  Social History Narrative  . Not on file   Social Determinants of Health   Financial Resource Strain:   . Difficulty of Paying Living Expenses: Not on file  Food Insecurity:   . Worried About Charity fundraiser in the Last Year: Not on file  . Ran Out of Food in the Last Year: Not on file  Transportation Needs:   . Lack of Transportation (Medical): Not on file  . Lack of Transportation (Non-Medical): Not on file  Physical Activity:   . Days of Exercise per Week: Not on file  . Minutes of Exercise per Session: Not on file  Stress:   . Feeling of Stress : Not on  file  Social Connections:   . Frequency of Communication with Friends and Family: Not on file  . Frequency of Social Gatherings with Friends and Family: Not on file  . Attends Religious Services: Not on file  . Active Member of Clubs or Organizations: Not on file  . Attends Archivist Meetings: Not on file  . Marital Status: Not on file  Intimate Partner Violence:   . Fear of Current or Ex-Partner: Not on file  . Emotionally Abused: Not on file  . Physically Abused: Not on file  . Sexually Abused: Not on file    No Known Allergies   Current Status: Since his last office visit, he is doing well with no complaints. He states that he has pain in his back. He rates his pain today at 3/10. He has not had a hospital visit for Sickle Cell Crisis since 07/06/2019 where He treated and discharged the same day. He is currently taking all medications as prescribed and staying well hydrated. He reports occasional nausea, constipation, dizziness and headaches. He denies fevers, chills, fatigue, recent infections, weight loss, and night sweats. He has not had any visual changes, and falls. No chest pain, heart palpitations, cough and shortness of breath reported. No reports of GI problems such  as vomiting, and diarrhea. He has no reports of blood in stools, dysuria and hematuria. No depression or anxiety reported today. He denies suicidal ideations, homicidal ideations, or auditory hallucinations.   Observations/Objective:  Telephone Virtual Visit   Assessment and Plan:  1. Hospital discharge follow-up  2. Sickle cell anemia without crisis [facility]) He is doing well today. He will continue to take pain medications as prescribed; will continue to avoid extreme heat and cold; will continue to eat a healthy diet and drink at least 64 ounces of water daily; continue stool softener as needed; will avoid colds and flu; will continue to get plenty of sleep and rest; will continue to avoid high  stressful situations and remain infection free; will continue Folic Acid 1 mg daily to avoid sickle cell crisis. Continue to follow up with Hematologist as needed.   3. Chronic pain syndrome  4. Chronic, continuous use of opioids  5. Nausea Mild.   6. Follow up He will follow up in 2 months.   No orders of the defined types were placed in this encounter.   No orders of the defined types were placed in this encounter.   Referral Orders  No referral(s) requested today    [name],  MSN, FNP-BC [facility]/Sickle Cell Center [facility] [address] [place], [place] [address] [phone] fax   Follow Up Instructions:    I discussed the assessment and treatment plan with the patient. The patient was provided an opportunity to ask questions and all were answered. The patient agreed with the plan and demonstrated an understanding of the instructions.   The patient was advised to call back or seek an in-person evaluation if the symptoms worsen or if the condition fails to improve as anticipated.  I provided 20 minutes of non-face-to-face time during this encounter.   Kallie Locks, FNP

## 2019-07-24 ENCOUNTER — Ambulatory Visit: Payer: 59 | Admitting: Family Medicine

## 2019-08-07 ENCOUNTER — Telehealth: Payer: Self-pay | Admitting: Family Medicine

## 2019-08-07 ENCOUNTER — Other Ambulatory Visit: Payer: Self-pay | Admitting: Family Medicine

## 2019-08-07 DIAGNOSIS — F1190 Opioid use, unspecified, uncomplicated: Secondary | ICD-10-CM

## 2019-08-07 DIAGNOSIS — G894 Chronic pain syndrome: Secondary | ICD-10-CM

## 2019-08-07 DIAGNOSIS — D571 Sickle-cell disease without crisis: Secondary | ICD-10-CM

## 2019-08-07 MED ORDER — OXYCODONE HCL 10 MG PO TABS: 10.0000 mg | ORAL_TABLET | Freq: Four times a day (QID) | ORAL | 0 refills | Status: DC | PRN

## 2019-08-08 NOTE — Telephone Encounter (Signed)
done

## 2019-08-21 ENCOUNTER — Other Ambulatory Visit: Payer: Self-pay | Admitting: Family Medicine

## 2019-08-21 ENCOUNTER — Telehealth: Payer: Self-pay | Admitting: Family Medicine

## 2019-08-21 DIAGNOSIS — F1190 Opioid use, unspecified, uncomplicated: Secondary | ICD-10-CM

## 2019-08-21 DIAGNOSIS — G894 Chronic pain syndrome: Secondary | ICD-10-CM

## 2019-08-21 DIAGNOSIS — D571 Sickle-cell disease without crisis: Secondary | ICD-10-CM

## 2019-08-21 MED ORDER — OXYCODONE HCL 10 MG PO TABS: 10.0000 mg | ORAL_TABLET | Freq: Four times a day (QID) | ORAL | 0 refills | Status: DC | PRN

## 2019-08-22 NOTE — Telephone Encounter (Signed)
done

## 2019-08-23 ENCOUNTER — Telehealth: Payer: Self-pay | Admitting: Family Medicine

## 2019-08-23 ENCOUNTER — Other Ambulatory Visit: Payer: Self-pay

## 2019-08-23 DIAGNOSIS — R112 Nausea with vomiting, unspecified: Secondary | ICD-10-CM

## 2019-08-23 MED ORDER — PROMETHAZINE HCL 25 MG PO TABS: 25.0000 mg | ORAL_TABLET | Freq: Four times a day (QID) | ORAL | 0 refills | Status: DC | PRN

## 2019-08-23 NOTE — Telephone Encounter (Signed)
Rx sent to [facility] 

## 2019-09-04 ENCOUNTER — Telehealth: Payer: Self-pay | Admitting: Family Medicine

## 2019-09-04 ENCOUNTER — Other Ambulatory Visit: Payer: Self-pay | Admitting: Family Medicine

## 2019-09-04 DIAGNOSIS — D571 Sickle-cell disease without crisis: Secondary | ICD-10-CM

## 2019-09-04 DIAGNOSIS — G894 Chronic pain syndrome: Secondary | ICD-10-CM

## 2019-09-04 DIAGNOSIS — F1190 Opioid use, unspecified, uncomplicated: Secondary | ICD-10-CM

## 2019-09-04 MED ORDER — OXYCODONE HCL 10 MG PO TABS: 10.0000 mg | ORAL_TABLET | Freq: Four times a day (QID) | ORAL | 0 refills | Status: DC | PRN

## 2019-09-04 NOTE — Telephone Encounter (Signed)
Pt needs refill on oxycodone.  °

## 2019-09-18 ENCOUNTER — Encounter: Payer: Self-pay | Admitting: Family Medicine

## 2019-09-18 ENCOUNTER — Other Ambulatory Visit: Payer: Self-pay

## 2019-09-18 ENCOUNTER — Ambulatory Visit (INDEPENDENT_AMBULATORY_CARE_PROVIDER_SITE_OTHER): Payer: 59 | Admitting: Family Medicine

## 2019-09-18 VITALS — BP 116/64 | HR 74 | Temp 98.0°F | Ht 67.0 in | Wt 179.0 lb

## 2019-09-18 DIAGNOSIS — G894 Chronic pain syndrome: Secondary | ICD-10-CM | POA: Diagnosis not present

## 2019-09-18 DIAGNOSIS — F1190 Opioid use, unspecified, uncomplicated: Secondary | ICD-10-CM

## 2019-09-18 DIAGNOSIS — D571 Sickle-cell disease without crisis: Secondary | ICD-10-CM

## 2019-09-18 DIAGNOSIS — R112 Nausea with vomiting, unspecified: Secondary | ICD-10-CM | POA: Diagnosis not present

## 2019-09-18 DIAGNOSIS — Z09 Encounter for follow-up examination after completed treatment for conditions other than malignant neoplasm: Secondary | ICD-10-CM | POA: Diagnosis not present

## 2019-09-18 DIAGNOSIS — R110 Nausea: Secondary | ICD-10-CM | POA: Diagnosis not present

## 2019-09-18 LAB — POCT URINALYSIS DIPSTICK
Bilirubin, UA: NEGATIVE
Blood, UA: NEGATIVE
Glucose, UA: NEGATIVE
Ketones, UA: NEGATIVE
Leukocytes, UA: NEGATIVE
Nitrite, UA: NEGATIVE
Protein, UA: NEGATIVE
Spec Grav, UA: >=1.03 — AB (ref 1.010–1.025)
Urobilinogen, UA: 4 E.U./dL — AB
pH, UA: 6 (ref 5.0–8.0)

## 2019-09-18 MED ORDER — OXYCODONE HCL 10 MG PO TABS: 10.0000 mg | ORAL_TABLET | Freq: Four times a day (QID) | ORAL | 0 refills | Status: DC | PRN

## 2019-09-18 MED ORDER — PROMETHAZINE HCL 25 MG PO TABS: 25.0000 mg | ORAL_TABLET | Freq: Four times a day (QID) | ORAL | 0 refills | Status: DC | PRN

## 2019-09-18 MED FILL — oxyCODONE HCL 10 MG TABS: 10 | 15 days supply | Qty: 60 | Fill #0

## 2019-09-18 MED FILL — PROMETHAZINE 25 MG TABLET: 25 | 7 days supply | Qty: 30 | Fill #0

## 2019-09-18 NOTE — Progress Notes (Signed)
Patient [name]    Established Patient Office Visit  Subjective:  Patient ID: Austin [name], male    DOB: [birth year]  Age: 29 y.o. MRN: [id number]  CC:  Chief Complaint  Patient presents with  . Follow-up    Sickle Cell  . Fatigue    HPI Austin [name] is a 29 year old male who presents for Follow Up today.   Past Medical History:  Diagnosis Date  . Acute maxillary sinusitis   . Eczema   . Sickle cell anemia [facility])      Patient Active Problem List   Diagnosis Date Noted  . Medication management 03/20/2019  . Bilious vomiting without nausea   . Viral gastroenteritis 01/23/2019  . Nausea vomiting and diarrhea   . Generalized abdominal pain   . Hb-SS disease with crisis ([place])   . Dehydration   . Sickle cell pain crisis ([place]) 05/18/2018  . Acute sickle cell crisis ([place]) 12/15/2017  . Thrombocytopenia ([place]) 12/15/2017  . Splenomegaly 12/15/2017  . Sickle cell anemia with crisis ([place]) 12/15/2017  . 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 Spleen enlarged 08/18/2017    Current Status: Since his last office visit, he is doing well with no complaints. He states that he has pain in his arms and legs. He rates his pain today at 5/10. He has not had a hospital visit for Sickle Cell Crisis since 07/06/2019 where he was treated and discharged the same day. He is currently taking all medications as prescribed and staying well hydrated. He reports occasional nausea, vomiting, constipation, dizziness and headaches.  He denies fevers, chills, fatigue, recent infections, weight loss, and night sweats. No reports of GI problems such as diarrhea. He has no reports of blood in stools, dysuria and hematuria. His anxiety is moderate today, r/t the death of his Auntie. He denies suicidal ideations, homicidal ideations, or auditory hallucinations. He denies pain today.   Past Surgical History:  Procedure Laterality Date  . WISDOM TOOTH  EXTRACTION      Family History  Problem Relation Age of Onset  . Sickle cell trait Mother   . Diabetes Father   . Hypertension Father     Social History   Socioeconomic History  . Marital status: Married    Spouse name: Not on file  . Number of children: Not on file  . Years of education: Not on file  . Highest education level: Not on file  Occupational History  . Not on file  Tobacco Use  . Smoking status: Former Smoker    Packs/day: 0.10  . Smokeless tobacco: Never Used  Substance and Sexual Activity  . Alcohol use: Not Currently    Comment: occ  . Drug use: Yes    Types: Marijuana  . Sexual activity: Yes  Other Topics Concern  . Not on file  Social History Narrative  . Not on file   Social Determinants of Health   Financial Resource Strain:   . Difficulty of Paying Living Expenses: Not on file  Food Insecurity:   . Worried About Charity fundraiser in the Last Year: Not on file  . Ran Out of Food in the Last Year: Not on file  Transportation Needs:   . Lack of Transportation (Medical): Not on file  . Lack of Transportation (Non-Medical): Not on file  Physical Activity:   . Days of Exercise per Week: Not on file  . Minutes of Exercise per Session:  Not on file  Stress:   . Feeling of Stress : Not on file  Social Connections:   . Frequency of Communication with Friends and Family: Not on file  . Frequency of Social Gatherings with Friends and Family: Not on file  . Attends Religious Services: Not on file  . Active Member of Clubs or Organizations: Not on file  . Attends Banker Meetings: Not on file  . Marital Status: Not on file  Intimate Partner Violence:   . Fear of Current or Ex-Partner: Not on file  . Emotionally Abused: Not on file  . Physically Abused: Not on file  . Sexually Abused: Not on file    Outpatient Medications Prior to Visit  Medication Sig Dispense Refill  . folic acid (FOLVITE) 1 MG tablet Take 1 tablet (1 mg total) by  mouth daily. 30 tablet 11  . ibuprofen (ADVIL,MOTRIN) 800 MG tablet Take 1 tablet (800 mg total) by mouth every 8 (eight) hours as needed. 30 tablet 1  . loratadine (CLARITIN) 10 MG tablet Take 1 tablet (10 mg total) by mouth daily. 30 tablet 6  . ondansetron (ZOFRAN) 4 MG tablet Take 1 tablet (4 mg total) by mouth every 8 (eight) hours as needed for nausea or vomiting. 20 tablet 3  . tetrahydrozoline 0.05 % ophthalmic solution Place 1 drop into both eyes 2 (two) times daily as needed (Red eyes).    . Oxycodone HCl 10 MG TABS Take 1 tablet (10 mg total) by mouth every 6 (six) hours as needed for up to 15 days (pain). 60 tablet 0  . promethazine (PHENERGAN) 25 MG tablet Take 1 tablet (25 mg total) by mouth every 6 (six) hours as needed for nausea or vomiting. 30 tablet 0   No facility-administered medications prior to visit.    No Known Allergies  ROS Review of Systems  Constitutional: Negative.   HENT: Negative.   Eyes: Negative.   Respiratory: Negative.   Cardiovascular: Negative.   Gastrointestinal: Positive for constipation (occasional ) and nausea (occasional ).  Endocrine: Negative.   Genitourinary: Negative.   Musculoskeletal: Positive for arthralgias (generalized joint pain).  Skin: Negative.   Allergic/Immunologic: Negative.   Neurological: Positive for dizziness (occasional ) and headaches (occasional).  Hematological: Negative.   Psychiatric/Behavioral: Negative.       Objective:    Physical Exam  Constitutional: He is oriented to person, place, and time. He appears well-developed and well-nourished.  HENT:  Head: Normocephalic and atraumatic.  Eyes: Conjunctivae are normal.  Cardiovascular: Normal rate, regular rhythm, normal heart sounds and intact distal pulses.  Pulmonary/Chest: Effort normal and breath sounds normal.  Abdominal: Soft. Bowel sounds are normal.  Musculoskeletal:        General: Normal range of motion.     Cervical back: Normal range of motion  and neck supple.  Neurological: He is alert and oriented to person, place, and time. He has normal reflexes.  Skin: Skin is warm and dry.  Psychiatric: He has a normal mood and affect. His behavior is normal. Judgment and thought content normal.  Nursing note and vitals reviewed.   BP 116/64   Pulse 74   Temp 98 F (36.7 C)   Ht 5\' 7"  (1.702 m)   Wt 179 lb (81.2 kg)   SpO2 99%   BMI 28.04 kg/m  Wt Readings from Last 3 Encounters:  09/18/19 179 lb (81.2 kg)  05/21/19 165 lb 5.5 oz (75 kg)  05/07/19 164 lb (74.4 kg)  Health Maintenance Due  Topic Date Due  . INFLUENZA VACCINE  02/24/2019    There are no preventive care reminders to display for this patient.  Lab Results  Component Value Date   TSH 0.662 08/12/2017   Lab Results  Component Value Date   WBC 12.7 (H) 05/21/2019   HGB 12.6 (L) 05/21/2019   HCT 35.3 (L) 05/21/2019   MCV 79.7 (L) 05/21/2019   PLT 154 05/21/2019   Lab Results  Component Value Date   NA 138 05/21/2019   K 4.3 05/21/2019   CO2 26 05/21/2019   GLUCOSE 160 (H) 05/21/2019   BUN 9 05/21/2019   CREATININE 0.92 05/21/2019   BILITOT 1.7 (H) 05/21/2019   ALKPHOS 53 05/21/2019   AST 10 (L) 05/21/2019   ALT 20 05/21/2019   PROT 8.6 (H) 05/21/2019   ALBUMIN 5.0 05/21/2019   CALCIUM 9.8 05/21/2019   ANIONGAP 8 05/21/2019   No results found for: CHOL No results found for: HDL No results found for: LDLCALC No results found for: TRIG No results found for: [facility] Lab Results  Component Value Date   HGBA1C 4.2 08/12/2017      Assessment & Plan:   1. Sickle cell anemia without crisis [facility]) He is doing well today. He will continue to take pain medications as prescribed; will continue to avoid extreme heat and cold; will continue to eat a healthy diet and drink at least 64 ounces of water daily; continue stool softener as needed; will avoid colds and flu; will continue to get plenty of sleep and rest; will continue to avoid high  stressful situations and remain infection free; will continue Folic Acid 1 mg daily to avoid sickle cell crisis. Continue to follow up with Hematologist as needed.  - POCT urinalysis dipstick - Oxycodone HCl 10 MG TABS; Take 1 tablet (10 mg total) by mouth every 6 (six) hours as needed for up to 15 days (pain).  Dispense: 60 tablet; Refill: 0  2. Chronic pain syndrome - Oxycodone HCl 10 MG TABS; Take 1 tablet (10 mg total) by mouth every 6 (six) hours as needed for up to 15 days (pain).  Dispense: 60 tablet; Refill: 0  3. Chronic, continuous use of opioids - Oxycodone HCl 10 MG TABS; Take 1 tablet (10 mg total) by mouth every 6 (six) hours as needed for up to 15 days (pain).  Dispense: 60 tablet; Refill: 0  4. Nausea and vomiting, intractability of vomiting not specified, unspecified vomiting type - promethazine (PHENERGAN) 25 MG tablet; Take 1 tablet (25 mg total) by mouth every 6 (six) hours as needed for nausea or vomiting.  Dispense: 30 tablet; Refill: 0  5. Nausea Stable today.   6. Follow up He will follow up in 2 months.   Meds ordered this encounter  Medications  . Oxycodone HCl 10 MG TABS    Sig: Take 1 tablet (10 mg total) by mouth every 6 (six) hours as needed for up to 15 days (pain).    Dispense:  60 tablet    Refill:  0  . promethazine (PHENERGAN) 25 MG tablet    Sig: Take 1 tablet (25 mg total) by mouth every 6 (six) hours as needed for nausea or vomiting.    Dispense:  30 tablet    Refill:  0    Orders Placed This Encounter  Procedures  . POCT urinalysis dipstick    Referral Orders  No referral(s) requested today    [name],  MSN,  FNP-BC [facility]/Sickle Cell Center [facility] [address] [place], [place] [address] [phone] fax  Problem List Items Addressed This Visit    None    Visit Diagnoses    Sickle cell anemia without crisis (HCC)    -  Primary   Relevant Medications    Oxycodone HCl 10 MG TABS   Other Relevant Orders   POCT urinalysis dipstick (Completed)   Chronic pain syndrome       Relevant Medications   Oxycodone HCl 10 MG TABS   Chronic, continuous use of opioids       Relevant Medications   Oxycodone HCl 10 MG TABS   Nausea and vomiting, intractability of vomiting not specified, unspecified vomiting type       Relevant Medications   promethazine (PHENERGAN) 25 MG tablet   Nausea       Follow up          Meds ordered this encounter  Medications  . Oxycodone HCl 10 MG TABS    Sig: Take 1 tablet (10 mg total) by mouth every 6 (six) hours as needed for up to 15 days (pain).    Dispense:  60 tablet    Refill:  0  . promethazine (PHENERGAN) 25 MG tablet    Sig: Take 1 tablet (25 mg total) by mouth every 6 (six) hours as needed for nausea or vomiting.    Dispense:  30 tablet    Refill:  0    Follow-up: Return in about 2 months (around 11/16/2019).    Kallie Locks, FNP

## 2019-09-19 ENCOUNTER — Telehealth: Payer: Self-pay

## 2019-09-19 NOTE — Telephone Encounter (Signed)
Patient stated his prescriptions where sent to the incorrect location on yesterday. But I am not sure if he going to go to [facility] to pick up sent scripts.   Message left for call back for more details. Austin [name] is stressed for time today.

## 2019-09-19 NOTE — Telephone Encounter (Signed)
He will go to pick up the scripts in [facility].

## 2019-09-20 DIAGNOSIS — R110 Nausea: Secondary | ICD-10-CM | POA: Insufficient documentation

## 2019-09-20 DIAGNOSIS — D571 Sickle-cell disease without crisis: Secondary | ICD-10-CM | POA: Insufficient documentation

## 2019-09-20 DIAGNOSIS — G894 Chronic pain syndrome: Secondary | ICD-10-CM | POA: Insufficient documentation

## 2019-09-20 DIAGNOSIS — F1190 Opioid use, unspecified, uncomplicated: Secondary | ICD-10-CM | POA: Insufficient documentation

## 2019-09-21 ENCOUNTER — Ambulatory Visit: Payer: 59 | Admitting: Family Medicine

## 2019-10-02 ENCOUNTER — Telehealth: Payer: Self-pay | Admitting: Family Medicine

## 2019-10-03 ENCOUNTER — Other Ambulatory Visit: Payer: Self-pay | Admitting: Family Medicine

## 2019-10-03 ENCOUNTER — Telehealth: Payer: Self-pay | Admitting: Family Medicine

## 2019-10-03 DIAGNOSIS — G894 Chronic pain syndrome: Secondary | ICD-10-CM

## 2019-10-03 DIAGNOSIS — D571 Sickle-cell disease without crisis: Secondary | ICD-10-CM

## 2019-10-03 DIAGNOSIS — F1190 Opioid use, unspecified, uncomplicated: Secondary | ICD-10-CM

## 2019-10-03 MED ORDER — OXYCODONE HCL 10 MG PO TABS: 10.0000 mg | ORAL_TABLET | Freq: Four times a day (QID) | ORAL | 0 refills | Status: DC | PRN

## 2019-10-03 NOTE — Telephone Encounter (Signed)
Pt called stressing urgency of [facility] [place]. Please follow up with pt. Oxycodone runs out/due today.

## 2019-10-05 NOTE — Telephone Encounter (Signed)
Done

## 2019-10-16 ENCOUNTER — Non-Acute Institutional Stay (HOSPITAL_COMMUNITY)
Admission: AD | Admit: 2019-10-16 | Discharge: 2019-10-16 | Disposition: A | Discharge status: Home / Self Care | Payer: 59 | Source: Ambulatory Visit | Attending: Internal Medicine | Admitting: Internal Medicine

## 2019-10-16 ENCOUNTER — Telehealth (HOSPITAL_COMMUNITY): Payer: Self-pay | Admitting: *Deleted

## 2019-10-16 ENCOUNTER — Encounter (HOSPITAL_COMMUNITY): Payer: Self-pay | Admitting: Family Medicine

## 2019-10-16 DIAGNOSIS — G894 Chronic pain syndrome: Secondary | ICD-10-CM | POA: Insufficient documentation

## 2019-10-16 DIAGNOSIS — Z87891 Personal history of nicotine dependence: Secondary | ICD-10-CM | POA: Insufficient documentation

## 2019-10-16 DIAGNOSIS — Z79899 Other long term (current) drug therapy: Secondary | ICD-10-CM | POA: Insufficient documentation

## 2019-10-16 DIAGNOSIS — F1120 Opioid dependence, uncomplicated: Secondary | ICD-10-CM | POA: Diagnosis not present

## 2019-10-16 DIAGNOSIS — Z832 Family history of diseases of the blood and blood-forming organs and certain disorders involving the immune mechanism: Secondary | ICD-10-CM | POA: Diagnosis not present

## 2019-10-16 DIAGNOSIS — D5700 Hb-SS disease with crisis, unspecified: Secondary | ICD-10-CM | POA: Diagnosis not present

## 2019-10-16 LAB — COMPREHENSIVE METABOLIC PANEL
ALT: 23 U/L (ref 0–44)
AST: 14 U/L — ABNORMAL LOW (ref 15–41)
Albumin: 4.6 g/dL (ref 3.5–5.0)
Alkaline Phosphatase: 57 U/L (ref 38–126)
Anion gap: 12 (ref 5–15)
BUN: 9 mg/dL (ref 6–20)
CO2: 24 mmol/L (ref 22–32)
Calcium: 9.5 mg/dL (ref 8.9–10.3)
Chloride: 103 mmol/L (ref 98–111)
Creatinine, Ser: 0.92 mg/dL (ref 0.61–1.24)
GFR calc Af Amer: >60 mL/min (ref >60)
GFR calc non Af Amer: >60 mL/min (ref >60)
Glucose, Bld: 184 mg/dL — ABNORMAL HIGH (ref 70–99)
Potassium: 3.9 mmol/L (ref 3.5–5.1)
Sodium: 139 mmol/L (ref 135–145)
Total Bilirubin: 1.6 mg/dL — ABNORMAL HIGH (ref 0.3–1.2)
Total Protein: 8.3 g/dL — ABNORMAL HIGH (ref 6.5–8.1)

## 2019-10-16 LAB — CBC WITH DIFFERENTIAL/PLATELET
Abs Immature Granulocytes: 0.05 10*3/uL (ref 0.00–0.07)
Basophils Absolute: 0 10*3/uL (ref 0.0–0.1)
Basophils Relative: 0 %
Eosinophils Absolute: 0.1 10*3/uL (ref 0.0–0.5)
Eosinophils Relative: 1 %
HCT: 36.6 % — ABNORMAL LOW (ref 39.0–52.0)
Hemoglobin: 12.9 g/dL — ABNORMAL LOW (ref 13.0–17.0)
Immature Granulocytes: 1 %
Lymphocytes Relative: 21 %
Lymphs Abs: 1.9 10*3/uL (ref 0.7–4.0)
MCH: 28.3 pg (ref 26.0–34.0)
MCHC: 35.2 g/dL (ref 30.0–36.0)
MCV: 80.3 fL (ref 80.0–100.0)
Monocytes Absolute: 0.6 10*3/uL (ref 0.1–1.0)
Monocytes Relative: 7 %
Neutro Abs: 6.3 10*3/uL (ref 1.7–7.7)
Neutrophils Relative %: 70 %
Platelets: 157 10*3/uL (ref 150–400)
RBC: 4.56 MIL/uL (ref 4.22–5.81)
RDW: 14.5 % (ref 11.5–15.5)
WBC: 9 10*3/uL (ref 4.0–10.5)
nRBC: 0.7 % — ABNORMAL HIGH (ref 0.0–0.2)

## 2019-10-16 LAB — RETICULOCYTES
Immature Retic Fract: 28.7 % — ABNORMAL HIGH (ref 2.3–15.9)
RBC.: 4.56 MIL/uL (ref 4.22–5.81)
Retic Count, Absolute: 206.6 10*3/uL — ABNORMAL HIGH (ref 19.0–186.0)
Retic Ct Pct: 4.5 % — ABNORMAL HIGH (ref 0.4–3.1)

## 2019-10-16 MED ORDER — DIPHENHYDRAMINE HCL 25 MG PO CAPS: 25.0000 mg | ORAL_CAPSULE | ORAL | Status: DC | PRN

## 2019-10-16 MED ORDER — PROMETHAZINE HCL 25 MG/ML IJ SOLN
12.5000 mg | Freq: Four times a day (QID) | INTRAMUSCULAR | Status: DC | PRN
  Administered 2019-10-16: 12.5 mg via INTRAVENOUS
  Filled 2019-10-16: qty 1

## 2019-10-16 MED ORDER — HYDROMORPHONE 1 MG/ML IV SOLN
INTRAVENOUS | Status: DC
  Administered 2019-10-16: 30 mg via INTRAVENOUS
  Administered 2019-10-16: 5.5 mg via INTRAVENOUS
  Filled 2019-10-16: qty 30

## 2019-10-16 MED ORDER — ONDANSETRON HCL 4 MG/2ML IJ SOLN
4.0000 mg | Freq: Four times a day (QID) | INTRAMUSCULAR | Status: DC | PRN
  Administered 2019-10-16: 4 mg via INTRAVENOUS
  Filled 2019-10-16: qty 2

## 2019-10-16 MED ORDER — SODIUM CHLORIDE 0.9% FLUSH: 9.0000 mL | INTRAVENOUS | Status: DC | PRN

## 2019-10-16 MED ORDER — SODIUM CHLORIDE 0.45 % IV SOLN: INTRAVENOUS | Status: DC

## 2019-10-16 MED ORDER — NALOXONE HCL 0.4 MG/ML IJ SOLN: 0.4000 mg | INTRAMUSCULAR | Status: DC | PRN

## 2019-10-16 MED ORDER — ACETAMINOPHEN 500 MG PO TABS
1000.0000 mg | ORAL_TABLET | Freq: Once | ORAL | Status: AC
  Administered 2019-10-16: 1000 mg via ORAL
  Filled 2019-10-16: qty 2

## 2019-10-16 MED ORDER — KETOROLAC TROMETHAMINE 30 MG/ML IJ SOLN
15.0000 mg | Freq: Once | INTRAMUSCULAR | Status: AC
  Administered 2019-10-16: 15 mg via INTRAVENOUS
  Filled 2019-10-16: qty 1

## 2019-10-16 NOTE — Discharge Instructions (Signed)
Sickle Cell Anemia, Adult  Sickle cell anemia is a condition where your red blood cells are shaped like sickles. Red blood cells carry oxygen through the body. Sickle-shaped cells do not live as long as normal red blood cells. They also clump together and block blood from flowing through the blood vessels. This prevents the body from getting enough oxygen. Sickle cell anemia causes organ damage and pain. It also increases the risk of infection. Follow these instructions at home: Medicines  Take over-the-counter and prescription medicines only as told by your doctor.  If you were prescribed an antibiotic medicine, take it as told by your doctor. Do not stop taking the antibiotic even if you start to feel better.  If you develop a fever, do not take medicines to lower the fever right away. Tell your doctor about the fever. Managing pain, stiffness, and swelling  Try these methods to help with pain: ? Use a heating pad. ? Take a warm bath. ? Distract yourself, such as by watching TV. Eating and drinking  Drink enough fluid to keep your pee (urine) clear or pale yellow. Drink more in hot weather and during exercise.  Limit or avoid alcohol.  Eat a healthy diet. Eat plenty of fruits, vegetables, whole grains, and lean protein.  Take vitamins and supplements as told by your doctor. Traveling  When traveling, keep these with you: ? Your medical information. ? The names of your doctors. ? Your medicines.  If you need to take an airplane, talk to your doctor first. Activity  Rest often.  Avoid exercises that make your heart beat much faster, such as jogging. General instructions  Do not use products that have nicotine or tobacco, such as cigarettes and e-cigarettes. If you need help quitting, ask your doctor.  Consider wearing a medical alert bracelet.  Avoid being in high places (high altitudes), such as mountains.  Avoid very hot or cold temperatures.  Avoid places where the  temperature changes a lot.  Keep all follow-up visits as told by your doctor. This is important. Contact a doctor if:  A joint hurts.  Your feet or hands hurt or swell.  You feel tired (fatigued). Get help right away if:  You have symptoms of infection. These include: ? Fever. ? Chills. ? Being very tired. ? Irritability. ? Poor eating. ? Throwing up (vomiting).  You feel dizzy or faint.  You have new stomach pain, especially on the left side.  You have a an erection (priapism) that lasts more than 4 hours.  You have numbness in your arms or legs.  You have a hard time moving your arms or legs.  You have trouble talking.  You have pain that does not go away when you take medicine.  You are short of breath.  You are breathing fast.  You have a long-term cough.  You have pain in your chest.  You have a bad headache.  You have a stiff neck.  Your stomach looks bloated even though you did not eat much.  Your skin is pale.  You suddenly cannot see well. Summary  Sickle cell anemia is a condition where your red blood cells are shaped like sickles.  Follow your doctor's advice on ways to manage pain, food to eat, activities to do, and steps to take for safe travel.  Get medical help right away if you have any signs of infection, such as a fever. This information is not intended to replace advice given to you by   your health care provider. Make sure you discuss any questions you have with your health care provider. Document Revised: 11/03/2018 Document Reviewed: 08/17/2016 Elsevier Patient Education  2020 Elsevier Inc.  

## 2019-10-16 NOTE — Progress Notes (Signed)
Patient admitted to the day infusion hospital for sickle cell pain crisis. Initially, patient reported left leg pain rated 10/10. For pain management, patient placed on Dilaudid PCA, given 15 mg Toradol, 1000 mg Tylenol  and hydrated with IV fluids. At discharge, patient rated pain at 3/10. Vital signs stable. Discharge instructions given. Patient alert, oriented and ambulatory at discharge.

## 2019-10-16 NOTE — H&P (Signed)
[facility] History and Physical   Date: 10/16/2019  Patient name: Austin [name] Medical record number: [id number] Date of birth: 09/29/1990 Age: 29 y.o. Gender: male PCP: Azzie Glatter, FNP  Attending physician: Tresa Garter, MD  Chief Complaint: Sickle cell pain  History of Present Illness: Austin [name], a 29 year old male with a medical history significant for sickle cell disease, chronic pain syndrome, opiate dependence and tolerance, and history of anemia of chronic disease presents complaining of generalized pain.  Pain is consistent with previous sickle cell pain crisis.  Patient says the pain intensity has been elevated over the past several days and has been unrelieved by home medications.  He has not identified any provocative factors concerning crisis.  Pain intensity is 9/10 characterized as constant and throbbing.  Patient last had oxycodone this a.m. without sustained relief.  He denies headache, fever, chills, shortness of breath, urinary symptoms, nausea, vomiting, or diarrhea.  He has no sick contacts, he has not traveled recently, or been exposed to COVID-19 infection.   Meds: Medications Prior to Admission  Medication Sig Dispense Refill Last Dose  . folic acid (FOLVITE) 1 MG tablet Take 1 tablet (1 mg total) by mouth daily. 30 tablet 11   . ibuprofen (ADVIL,MOTRIN) 800 MG tablet Take 1 tablet (800 mg total) by mouth every 8 (eight) hours as needed. 30 tablet 1   . loratadine (CLARITIN) 10 MG tablet Take 1 tablet (10 mg total) by mouth daily. 30 tablet 6   . ondansetron (ZOFRAN) 4 MG tablet Take 1 tablet (4 mg total) by mouth every 8 (eight) hours as needed for nausea or vomiting. 20 tablet 3   . Oxycodone HCl 10 MG TABS Take 1 tablet (10 mg total) by mouth every 6 (six) hours as needed for up to 15 days (pain). 60 tablet 0   . promethazine (PHENERGAN) 25 MG tablet Take 1 tablet (25 mg total) by mouth every 6 (six) hours as needed for  nausea or vomiting. 30 tablet 0   . tetrahydrozoline 0.05 % ophthalmic solution Place 1 drop into both eyes 2 (two) times daily as needed (Red eyes).       Allergies: Patient has no known allergies. Past Medical History:  Diagnosis Date  . Acute maxillary sinusitis   . Eczema   . Sickle cell anemia (HCC)    Past Surgical History:  Procedure Laterality Date  . WISDOM TOOTH EXTRACTION     Family History  Problem Relation Age of Onset  . Sickle cell trait Mother   . Diabetes Father   . Hypertension Father    Social History   Socioeconomic History  . Marital status: Married    Spouse name: Not on file  . Number of children: Not on file  . Years of education: Not on file  . Highest education level: Not on file  Occupational History  . Not on file  Tobacco Use  . Smoking status: Former Smoker    Packs/day: 0.10  . Smokeless tobacco: Never Used  Substance and Sexual Activity  . Alcohol use: Not Currently    Comment: occ  . Drug use: Yes    Types: Marijuana  . Sexual activity: Yes  Other Topics Concern  . Not on file  Social History Narrative  . Not on file   Social Determinants of Health   Financial Resource Strain:   . Difficulty of Paying Living Expenses:   Food Insecurity:   . Worried About Estate manager/land agent  of Food in the Last Year:   . Ran Out of Food in the Last Year:   Transportation Needs:   . Lack of Transportation (Medical):   Marland Kitchen Lack of Transportation (Non-Medical):   Physical Activity:   . Days of Exercise per Week:   . Minutes of Exercise per Session:   Stress:   . Feeling of Stress :   Social Connections:   . Frequency of Communication with Friends and Family:   . Frequency of Social Gatherings with Friends and Family:   . Attends Religious Services:   . Active Member of Clubs or Organizations:   . Attends Banker Meetings:   Marland Kitchen Marital Status:   Intimate Partner Violence:   . Fear of Current or Ex-Partner:   . Emotionally Abused:    Marland Kitchen Physically Abused:   . Sexually Abused:    Review of Systems  Constitutional: Negative.   HENT: Negative.   Eyes: Negative.   Respiratory: Negative.   Cardiovascular: Negative.   Gastrointestinal: Negative.   Genitourinary: Negative.   Musculoskeletal: Positive for back pain and joint pain.  Skin: Negative.   Neurological: Negative.   Psychiatric/Behavioral: Negative.     Physical Exam: There were no vitals taken for this visit.  Physical Exam Constitutional:      Appearance: Normal appearance.  HENT:     Head: Normocephalic.     Mouth/Throat:     Mouth: Mucous membranes are moist.     Pharynx: Oropharynx is clear.  Eyes:     General: No scleral icterus.    Pupils: Pupils are equal, round, and reactive to light.  Cardiovascular:     Rate and Rhythm: Normal rate and regular rhythm.     Pulses: Normal pulses.  Pulmonary:     Effort: Pulmonary effort is normal.     Breath sounds: Normal breath sounds.  Abdominal:     General: Bowel sounds are normal.  Musculoskeletal:        General: Normal range of motion.  Skin:    General: Skin is warm.  Neurological:     General: No focal deficit present.     Mental Status: He is alert. Mental status is at baseline.  Psychiatric:        Mood and Affect: Mood normal.        Behavior: Behavior normal.        Thought Content: Thought content normal.        Judgment: Judgment normal.     Lab results: No results found for this or any previous visit (from the past 24 hour(s)).  Imaging results:  No results found.   Assessment & Plan: Patient admitted to sickle cell day infusion center for management of pain crisis.  Patient is opiate tolerant Initiate IV dilaudid PCA. Settings of 0.5 mg, 10 minute lockout, 3 mg per hour IV fluids, 0.45% saline at 100 mL/h Toradol 15 mg IV times one dose Tylenol 1000 mg by mouth times one dose Review CBC with differential, complete metabolic panel, and reticulocytes as results become  available.  Pain intensity will be reevaluated in context of functioning and relationship to baseline as care progress If pain intensity remains elevated and/or sudden change in hemodynamic stability transition to inpatient services for higher level of care.       Nolon Nations  APRN, MSN, FNP-C [facility] [address] [place], [place] [address] [phone]  10/16/2019, 10:38 AM

## 2019-10-16 NOTE — Telephone Encounter (Signed)
Patient called requesting to come to the day hospital for sickle cell pain. Reports left leg pain and swelling x 1 week rated 10/10. Reports last taking Oxycodone last night. COVID-19 screening done and patient denies all symptoms and exposures. Denies fever, chest pain, nausea, vomiting, diarrhea, abdominal pain and priapism. Admits to having transportation at discharge without driving self. Patient's wife will transport him. [place], FNP notified. Patient can come to the day hospital for pain management. Patient advised and expresses an understanding.

## 2019-10-17 ENCOUNTER — Ambulatory Visit (INDEPENDENT_AMBULATORY_CARE_PROVIDER_SITE_OTHER): Payer: 59 | Admitting: Family Medicine

## 2019-10-17 ENCOUNTER — Other Ambulatory Visit: Payer: Self-pay | Admitting: Family Medicine

## 2019-10-17 ENCOUNTER — Encounter: Payer: Self-pay | Admitting: Family Medicine

## 2019-10-17 ENCOUNTER — Other Ambulatory Visit: Payer: Self-pay

## 2019-10-17 VITALS — BP 113/80 | HR 95 | Temp 98.0°F | Ht 67.0 in | Wt 183.0 lb

## 2019-10-17 DIAGNOSIS — M79604 Pain in right leg: Secondary | ICD-10-CM | POA: Insufficient documentation

## 2019-10-17 DIAGNOSIS — G894 Chronic pain syndrome: Secondary | ICD-10-CM | POA: Diagnosis not present

## 2019-10-17 DIAGNOSIS — F1190 Opioid use, unspecified, uncomplicated: Secondary | ICD-10-CM

## 2019-10-17 DIAGNOSIS — D5700 Hb-SS disease with crisis, unspecified: Secondary | ICD-10-CM

## 2019-10-17 DIAGNOSIS — R110 Nausea: Secondary | ICD-10-CM | POA: Diagnosis not present

## 2019-10-17 DIAGNOSIS — M79605 Pain in left leg: Secondary | ICD-10-CM | POA: Diagnosis not present

## 2019-10-17 DIAGNOSIS — R112 Nausea with vomiting, unspecified: Secondary | ICD-10-CM | POA: Diagnosis not present

## 2019-10-17 DIAGNOSIS — D571 Sickle-cell disease without crisis: Secondary | ICD-10-CM

## 2019-10-17 DIAGNOSIS — Z09 Encounter for follow-up examination after completed treatment for conditions other than malignant neoplasm: Secondary | ICD-10-CM

## 2019-10-17 MED ORDER — OXYCODONE HCL 10 MG PO TABS: 10.0000 mg | ORAL_TABLET | Freq: Four times a day (QID) | ORAL | 0 refills | Status: DC | PRN

## 2019-10-17 NOTE — Progress Notes (Signed)
[facility]    Established Patient Office Visit  Subjective:  Patient ID: Austin [name], male    DOB: [birth year]  Age: 29 y.o. MRN: [id number]  CC:  Chief Complaint  Patient presents with  . Hospitalization Follow-up    [facility] Cell Day Hospital follow up  . Work note    HPI Austin [name] is a 29 year old male who presents for Follow Up today.   Past Medical History:  Diagnosis Date  . Acute maxillary sinusitis   . Eczema   . Sickle cell anemia (HCC)     Current Status: Since his last office visit, he has c/o bilateral lower extremity pain and swelling, and tenderness noted, specially in left leg X 1 week now. She is doing well with no complaints. He states that she has pain r/t SCD in her arms and legs. She rates her pain today at 5/10. He has not had a hospital visit for Sickle Cell Crisis since 10/15/2019 where she was treated and discharged the same day. She is currently taking all medications as prescribed and staying well hydrated. She reports occasional nausea, constipation, dizziness and headaches. She denies fevers, chills, fatigue, recent infections, weight loss, and night sweats. Sh has not had any headaches, visual changes, dizziness, and falls. No chest pain, heart palpitations, cough and shortness of breath reported. No reports of GI problems such as nausea, vomiting, diarrhea, and constipation. She has no reports of blood in stools, dysuria and hematuria. No depression or anxiety reported today. He denies suicidal ideations, homicidal ideations, or auditory hallucinations. She is taking all medications as prescribed.   Past Surgical History:  Procedure Laterality Date  . WISDOM TOOTH EXTRACTION      Family History  Problem Relation Age of Onset  . Sickle cell trait Mother   . Diabetes Father   . Hypertension Father     Social History   Socioeconomic History  . Marital status: Married    Spouse name:  Not on file  . Number of children: Not on file  . Years of education: Not on file  . Highest education level: Not on file  Occupational History  . Not on file  Tobacco Use  . Smoking status: Former Smoker    Packs/day: 0.10  . Smokeless tobacco: Never Used  Substance and Sexual Activity  . Alcohol use: Not Currently    Comment: occ  . Drug use: Yes    Types: Marijuana  . Sexual activity: Yes  Other Topics Concern  . Not on file  Social History Narrative  . Not on file   Social Determinants of Health   Financial Resource Strain:   . Difficulty of Paying Living Expenses:   Food Insecurity:   . Worried About Programme researcher, broadcasting/film/video in the Last Year:   . Barista in the Last Year:   Transportation Needs:   . Freight forwarder (Medical):   Marland Kitchen Lack of Transportation (Non-Medical):   Physical Activity:   . Days of Exercise per Week:   . Minutes of Exercise per Session:   Stress:   . Feeling of Stress :   Social Connections:   . Frequency of Communication with Friends and Family:   . Frequency of Social Gatherings with Friends and Family:   . Attends Religious Services:   . Active Member of Clubs or Organizations:   . Attends Banker Meetings:   Marland Kitchen Marital Status:  Intimate Partner Violence:   . Fear of Current or Ex-Partner:   . Emotionally Abused:   Marland Kitchen Physically Abused:   . Sexually Abused:     Outpatient Medications Prior to Visit  Medication Sig Dispense Refill  . folic acid (FOLVITE) 1 MG tablet Take 1 tablet (1 mg total) by mouth daily. 30 tablet 11  . ibuprofen (ADVIL,MOTRIN) 800 MG tablet Take 1 tablet (800 mg total) by mouth every 8 (eight) hours as needed. 30 tablet 1  . loratadine (CLARITIN) 10 MG tablet Take 1 tablet (10 mg total) by mouth daily. 30 tablet 6  . ondansetron (ZOFRAN) 4 MG tablet Take 1 tablet (4 mg total) by mouth every 8 (eight) hours as needed for nausea or vomiting. 20 tablet 3  . Oxycodone HCl 10 MG TABS Take 1 tablet  (10 mg total) by mouth every 6 (six) hours as needed for up to 15 days (pain). 60 tablet 0  . promethazine (PHENERGAN) 25 MG tablet Take 1 tablet (25 mg total) by mouth every 6 (six) hours as needed for nausea or vomiting. 30 tablet 0  . tetrahydrozoline 0.05 % ophthalmic solution Place 1 drop into both eyes 2 (two) times daily as needed (Red eyes).     No facility-administered medications prior to visit.    No Known Allergies  ROS Review of Systems  HENT: Negative.   Eyes: Negative.   Respiratory: Negative.   Cardiovascular: Negative.   Gastrointestinal: Negative.   Endocrine: Negative.   Genitourinary: Negative.   Musculoskeletal: Positive for arthralgias (generalized joint pain).       Bilateral lower leg pain  Skin: Negative.   Allergic/Immunologic: Negative.   Neurological: Positive for dizziness (occasional ) and headaches (occasional).  Hematological: Negative.   Psychiatric/Behavioral: Negative.       Objective:    Physical Exam  Constitutional: He is oriented to person, place, and time. He appears well-developed and well-nourished.  HENT:  Head: Normocephalic and atraumatic.  Eyes: Conjunctivae are normal.  Cardiovascular: Normal rate, regular rhythm, normal heart sounds and intact distal pulses.  Pulmonary/Chest: Effort normal and breath sounds normal.  Abdominal: Soft. Bowel sounds are normal.  Musculoskeletal:     Cervical back: Normal range of motion and neck supple.     Comments: Bilateral leg pain  Neurological: He is alert and oriented to person, place, and time. He has normal reflexes.  Skin: Skin is warm and dry.  Psychiatric: He has a normal mood and affect. His behavior is normal. Judgment and thought content normal.  Nursing note and vitals reviewed.   BP 113/80   Pulse 95   Temp 98 F (36.7 C)   Ht 5\' 7"  (1.702 m)   Wt 183 lb (83 kg)   SpO2 100%   BMI 28.66 kg/m  Wt Readings from Last 3 Encounters:  10/17/19 183 lb (83 kg)  09/18/19 179  lb (81.2 kg)  05/21/19 165 lb 5.5 oz (75 kg)     Health Maintenance Due  Topic Date Due  . INFLUENZA VACCINE  02/24/2019    There are no preventive care reminders to display for this patient.  Lab Results  Component Value Date   TSH 0.662 08/12/2017   Lab Results  Component Value Date   WBC 9.0 10/16/2019   HGB 12.9 (L) 10/16/2019   HCT 36.6 (L) 10/16/2019   MCV 80.3 10/16/2019   PLT 157 10/16/2019   Lab Results  Component Value Date   NA 139 10/16/2019   K  3.9 10/16/2019   CO2 24 10/16/2019   GLUCOSE 184 (H) 10/16/2019   BUN 9 10/16/2019   CREATININE 0.92 10/16/2019   BILITOT 1.6 (H) 10/16/2019   ALKPHOS 57 10/16/2019   AST 14 (L) 10/16/2019   ALT 23 10/16/2019   PROT 8.3 (H) 10/16/2019   ALBUMIN 4.6 10/16/2019   CALCIUM 9.5 10/16/2019   ANIONGAP 12 10/16/2019   No results found for: CHOL No results found for: HDL No results found for: LDLCALC No results found for: TRIG No results found for: [facility] Lab Results  Component Value Date   HGBA1C 4.2 08/12/2017      Assessment & Plan:   1. Bilateral lower extremity pain We will get Doppler of lower extremities today.  - VAS [place] LOWER EXTREMITY VENOUS (DVT); Future  2. Sickle cell pain without crisis [facility]) He is doing well today r/t his chronic pain management. He will continue to take pain medications as prescribed; will continue to avoid extreme heat and cold; will continue to eat a healthy diet and drink at least 64 ounces of water daily; continue stool softener as needed; will avoid colds and flu; will continue to get plenty of sleep and rest; will continue to avoid high stressful situations and remain infection free; will continue Folic Acid 1 mg daily to avoid sickle cell crisis. Continue to follow up with Hematologist as needed.   3. Chronic pain syndrome  4. Chronic, continuous use of opioids  5. Nausea and vomiting, intractability of vomiting not specified, unspecified vomiting type  6. Nausea   7. Follow up He will follow up in 2 minutes.   No orders of the defined types were placed in this encounter.   Orders Placed This Encounter  Procedures  . US ARTERIAL LOWER EXTREMITY DUPLEX BILATERAL  . VAS [place] LOWER EXTREMITY VENOUS (DVT)    Referral Orders  No referral(s) requested today    [name],  MSN, FNP-BC [facility] [address] [place], [place] [address] [phone]   Problem List Items Addressed This Visit      Other   Chronic pain syndrome   Chronic, continuous use of opioids   Nausea   Sickle cell pain crisis (HCC)    Other Visit Diagnoses    Bilateral lower extremity pain    -  Primary   Relevant Orders   US ARTERIAL LOWER EXTREMITY DUPLEX BILATERAL   VAS [place] LOWER EXTREMITY VENOUS (DVT)   Nausea and vomiting, intractability of vomiting not specified, unspecified vomiting type       Follow up          No orders of the defined types were placed in this encounter.   Follow-up: Return in about 2 months (around 12/17/2019).    Kallie Locks, FNP

## 2019-10-17 NOTE — Discharge Summary (Signed)
[facility] Discharge Summary   Patient ID: Austin [name] MRN: [id number] DOB/AGE: Jun 26, 1991 29 y.o.  Admit date: 10/16/2019 Discharge date: 10/17/2019  Primary Care Physician:  Kallie Locks, FNP  Admission Diagnoses:  Active Problems:   Sickle cell pain crisis [facility])   Discharge Medications:  Allergies as of 10/16/2019   No Known Allergies     Medication List    TAKE these medications   folic acid 1 MG tablet Commonly known as: FOLVITE Take 1 tablet (1 mg total) by mouth daily.   ibuprofen 800 MG tablet Commonly known as: ADVIL Take 1 tablet (800 mg total) by mouth every 8 (eight) hours as needed.   loratadine 10 MG tablet Commonly known as: CLARITIN Take 1 tablet (10 mg total) by mouth daily.   ondansetron 4 MG tablet Commonly known as: Zofran Take 1 tablet (4 mg total) by mouth every 8 (eight) hours as needed for nausea or vomiting.   Oxycodone HCl 10 MG Tabs Take 1 tablet (10 mg total) by mouth every 6 (six) hours as needed for up to 15 days (pain).   promethazine 25 MG tablet Commonly known as: PHENERGAN Take 1 tablet (25 mg total) by mouth every 6 (six) hours as needed for nausea or vomiting.   tetrahydrozoline 0.05 % ophthalmic solution Place 1 drop into both eyes 2 (two) times daily as needed (Red eyes).        Consults:  None  Significant Diagnostic Studies:  No results found.  History of present illness: Austin [name], a 29 year old male with a medical history significant for sickle cell disease, chronic pain syndrome, opiate dependence and tolerance, and history of anemia of chronic disease presents complaining of generalized pain.  Pain is consistent with previous sickle cell pain crisis.  Patient says the pain intensity has been elevated over the past several days and has been unrelieved by home medications.  He has not identified any provocative factors concerning crisis.  Pain intensity is 9/10 characterized as  constant and throbbing.  Patient last had oxycodone this a.m. without sustained relief.  He denies headache, fever, chills, shortness of breath, urinary symptoms, nausea, vomiting, or diarrhea.  He has no sick contacts, he has not traveled recently, or been exposed to COVID-19 infection.  [facility] Course: Patient admitted to sickle cell day infusion center for management of pain crisis.  All laboratory values reviewed, appears to be consistent with patient's baseline. Pain managed with IV Dilaudid PCA.  Settings of 0.5 mg, 10-minute lockout, and 3 mg/h. IV Toradol 15 mg x 1 dose.  Tylenol 1000 mg by mouth x1 dose.  IV fluids, 0.45% saline at 100 mL/h. Pain intensity decreased to 3/10.  Patient does not warrant admission on today. He is alert, oriented, and ambulating without assistance.  Patient advised to follow-up with PCP as scheduled. He will discharge home with family in a hemodynamically stable condition.   Discharge instructions:  Resume all home medications.   Follow up with PCP as previously  scheduled.   Discussed the importance of drinking 64 ounces of water daily, dehydration of red blood cells may lead further sickling.   Avoid all stressors that precipitate sickle cell pain crisis.     The patient was given clear instructions to go to ER or return to medical center if symptoms do not improve, worsen or new problems develop.    Physical Exam at Discharge:  BP 110/71 (BP Location: Left Arm)   Pulse 64  Temp (!) 97.3 F (36.3 C) (Temporal)   Resp 14   SpO2 100%  Physical Exam Constitutional:      Appearance: Normal appearance.  HENT:     Nose: Nose normal.  Eyes:     Pupils: Pupils are equal, round, and reactive to light.  Cardiovascular:     Rate and Rhythm: Normal rate and regular rhythm.     Pulses: Normal pulses.  Pulmonary:     Effort: Pulmonary effort is normal.  Abdominal:     General: Abdomen is flat. Bowel sounds are normal.   Musculoskeletal:        General: Normal range of motion.  Skin:    General: Skin is warm.  Neurological:     General: No focal deficit present.     Mental Status: He is alert. Mental status is at baseline.  Psychiatric:        Mood and Affect: Mood normal.        Behavior: Behavior normal.        Thought Content: Thought content normal.        Judgment: Judgment normal.      Disposition at Discharge: Discharge disposition: 01-Home or Self Care       Discharge Orders: Discharge Instructions    Discharge patient   Complete by: As directed    Discharge disposition: 01-Home or Self Care   Discharge patient date: 10/16/2019      Condition at Discharge:   Stable  Time spent on Discharge:  Greater than 30 minutes.  Signed: [name]  APRN, MSN, FNP-C [facility] [address] [place], [place] [address] [phone]  10/17/2019, 5:27 PM

## 2019-10-18 ENCOUNTER — Other Ambulatory Visit: Payer: Self-pay

## 2019-10-18 ENCOUNTER — Ambulatory Visit (HOSPITAL_COMMUNITY)
Admission: RE | Admit: 2019-10-18 | Discharge: 2019-10-18 | Disposition: A | Discharge status: Home / Self Care | Payer: 59 | Source: Ambulatory Visit | Attending: Family Medicine | Admitting: Family Medicine

## 2019-10-18 ENCOUNTER — Telehealth: Payer: Self-pay | Admitting: Family Medicine

## 2019-10-18 DIAGNOSIS — M79605 Pain in left leg: Secondary | ICD-10-CM | POA: Insufficient documentation

## 2019-10-18 DIAGNOSIS — R112 Nausea with vomiting, unspecified: Secondary | ICD-10-CM

## 2019-10-18 DIAGNOSIS — M79604 Pain in right leg: Secondary | ICD-10-CM | POA: Diagnosis present

## 2019-10-18 MED ORDER — PROMETHAZINE HCL 25 MG PO TABS: 25.0000 mg | ORAL_TABLET | Freq: Four times a day (QID) | ORAL | 0 refills | Status: DC | PRN

## 2019-10-18 NOTE — Telephone Encounter (Signed)
Done

## 2019-10-18 NOTE — Progress Notes (Signed)
Bilateral lower extremity venous duplex exam completed.  Preliminary results can be found under CV proc under chart review.  10/18/2019 12:01 PM  Isabele Lollar, K., RDMS, RVT

## 2019-10-24 ENCOUNTER — Telehealth: Payer: Self-pay

## 2019-10-24 ENCOUNTER — Other Ambulatory Visit: Payer: Self-pay | Admitting: Family Medicine

## 2019-10-24 ENCOUNTER — Telehealth: Payer: Self-pay | Admitting: Family Medicine

## 2019-10-24 DIAGNOSIS — M79604 Pain in right leg: Secondary | ICD-10-CM

## 2019-10-24 DIAGNOSIS — M79605 Pain in left leg: Secondary | ICD-10-CM

## 2019-10-24 NOTE — Telephone Encounter (Signed)
Pt's leg is sore and pt lost balance 3 times. Please call pt back. Pt currently at work. Asked for a detailed message if they were unable to answer

## 2019-10-24 NOTE — Telephone Encounter (Signed)
Mr. Gaba called back:   He is still having the left leg pain. He almost loss his balance twice due to the pain. The pain has also moved up to his left hip.   Please advise.

## 2019-10-24 NOTE — Telephone Encounter (Signed)
Urgent referral to [facility] will be picked up by "Ladonna Snide" first thing in the AM. She will call Mr. Bolar for scheduling. ([phone])

## 2019-10-31 ENCOUNTER — Other Ambulatory Visit: Payer: Self-pay | Admitting: Family Medicine

## 2019-10-31 ENCOUNTER — Telehealth: Payer: Self-pay | Admitting: Family Medicine

## 2019-10-31 DIAGNOSIS — F1190 Opioid use, unspecified, uncomplicated: Secondary | ICD-10-CM

## 2019-10-31 DIAGNOSIS — G894 Chronic pain syndrome: Secondary | ICD-10-CM

## 2019-10-31 DIAGNOSIS — D571 Sickle-cell disease without crisis: Secondary | ICD-10-CM

## 2019-10-31 MED ORDER — OXYCODONE HCL 10 MG PO TABS: 10.0000 mg | ORAL_TABLET | Freq: Four times a day (QID) | ORAL | 0 refills | Status: DC | PRN

## 2019-11-01 NOTE — Telephone Encounter (Signed)
  Please call patient.  Rx sent. 

## 2019-11-05 ENCOUNTER — Telehealth: Payer: Self-pay | Admitting: Family Medicine

## 2019-11-05 NOTE — Telephone Encounter (Addendum)
Pt dropped off paperwork today. States they need it completed by tomorrow. Pt was made aware of the 14 day policy.

## 2019-11-06 ENCOUNTER — Encounter (HOSPITAL_COMMUNITY): Payer: Self-pay | Admitting: *Deleted

## 2019-11-06 ENCOUNTER — Other Ambulatory Visit: Payer: Self-pay

## 2019-11-06 ENCOUNTER — Emergency Department (HOSPITAL_COMMUNITY)
Admission: EM | Admit: 2019-11-06 | Discharge: 2019-11-07 | Disposition: A | Discharge status: Home / Self Care | Payer: 59 | Attending: Emergency Medicine | Admitting: Emergency Medicine

## 2019-11-06 DIAGNOSIS — E876 Hypokalemia: Secondary | ICD-10-CM | POA: Insufficient documentation

## 2019-11-06 DIAGNOSIS — Z79899 Other long term (current) drug therapy: Secondary | ICD-10-CM | POA: Insufficient documentation

## 2019-11-06 DIAGNOSIS — Z87891 Personal history of nicotine dependence: Secondary | ICD-10-CM | POA: Insufficient documentation

## 2019-11-06 DIAGNOSIS — D5700 Hb-SS disease with crisis, unspecified: Secondary | ICD-10-CM | POA: Diagnosis not present

## 2019-11-06 MED ORDER — SODIUM CHLORIDE 0.9% FLUSH
3.0000 mL | Freq: Once | INTRAVENOUS | Status: AC
  Administered 2019-11-07: 3 mL via INTRAVENOUS

## 2019-11-06 NOTE — ED Triage Notes (Signed)
Onset of bilateral leg pain and back pain, consistent with sickle cell pain. Took prescribed phenergan and oxy today without relief.

## 2019-11-06 NOTE — ED Provider Notes (Signed)
[facility]-EMERGENCY DEPT Provider Note   CSN: [id number] Arrival date & time: 11/06/19  2001     History Chief Complaint  Patient presents with  . Sickle Cell Pain Crisis    Austin [name] is a 29 y.o. male.  The history is provided by the patient and medical records. No language interpreter was used.  Sickle Cell Pain Crisis Location:  Back and lower extremity Severity:  Severe Onset quality:  Gradual Duration:  2 days Timing:  Constant Progression:  Worsening Chronicity:  Recurrent History of pulmonary emboli: no   Relieved by:  Nothing Worsened by:  Nothing Ineffective treatments:  None tried Associated symptoms: no chest pain, no congestion, no cough, no fatigue, no fever, no headaches, no leg ulcers, no nausea, no priapism, no shortness of breath, no swelling of legs and no wheezing        Past Medical History:  Diagnosis Date  . Acute maxillary sinusitis   . Eczema   . Sickle cell anemia [facility])     Patient Active Problem List   Diagnosis Date Noted  . Bilateral lower extremity pain 10/17/2019  . Sickle cell anemia without crisis (HCC) 09/20/2019  . Chronic pain syndrome 09/20/2019  . Nausea 09/20/2019  . Chronic, continuous use of opioids 09/20/2019  . Medication management 03/20/2019  . Bilious vomiting without nausea   . Viral gastroenteritis 01/23/2019  . Nausea vomiting and diarrhea   . Generalized abdominal pain   . Hb-SS disease with crisis (HCC)   . Dehydration   . Sickle cell pain crisis (HCC) 05/18/2018  . Acute sickle cell crisis (HCC) 12/15/2017  . Thrombocytopenia (HCC) 12/15/2017  . Splenomegaly 12/15/2017  . Sickle cell anemia with crisis (HCC) 12/15/2017  . 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 Spleen enlarged 08/18/2017    Past Surgical History:  Procedure Laterality Date  . WISDOM TOOTH EXTRACTION         Family History  Problem Relation Age of Onset  . Sickle cell trait Mother   . Diabetes  Father   . Hypertension Father     Social History   Tobacco Use  . Smoking status: Former Smoker    Packs/day: 0.10  . Smokeless tobacco: Never Used  Substance Use Topics  . Alcohol use: Not Currently    Comment: occ  . Drug use: Yes    Types: Marijuana    Home Medications Prior to Admission medications   Medication Sig Start Date End Date Taking? Authorizing Provider  folic acid (FOLVITE) 1 MG tablet Take 1 tablet (1 mg total) by mouth daily. 01/25/19   Massie Maroon, FNP  ibuprofen (ADVIL,MOTRIN) 800 MG tablet Take 1 tablet (800 mg total) by mouth every 8 (eight) hours as needed. 05/10/18   Kallie Locks, FNP  loratadine (CLARITIN) 10 MG tablet Take 1 tablet (10 mg total) by mouth daily. 10/03/18   Kallie Locks, FNP  ondansetron (ZOFRAN) 4 MG tablet Take 1 tablet (4 mg total) by mouth every 8 (eight) hours as needed for nausea or vomiting. 07/09/19   Kallie Locks, FNP  Oxycodone HCl 10 MG TABS Take 1 tablet (10 mg total) by mouth every 6 (six) hours as needed for up to 15 days (pain). 11/01/19 11/16/19  Kallie Locks, FNP  promethazine (PHENERGAN) 25 MG tablet Take 1 tablet (25 mg total) by mouth every 6 (six) hours as needed for nausea or vomiting. 10/18/19   Kallie Locks, FNP  tetrahydrozoline 0.05 %  ophthalmic solution Place 1 drop into both eyes 2 (two) times daily as needed (Red eyes).    [provider]    Allergies    Patient has no known allergies.  Review of Systems   Review of Systems  Constitutional: Negative for chills, diaphoresis, fatigue and fever.  HENT: Negative for congestion.   Respiratory: Negative for cough, chest tightness, shortness of breath and wheezing.   Cardiovascular: Negative for chest pain, palpitations and leg swelling.  Gastrointestinal: Negative for abdominal pain and nausea.  Genitourinary: Negative for dysuria and flank pain.  Musculoskeletal: Positive for back pain. Negative for neck pain and neck stiffness.   Skin: Negative for rash and wound.  Neurological: Negative for dizziness, weakness, light-headedness, numbness and headaches.  Psychiatric/Behavioral: Negative for agitation.  All other systems reviewed and are negative.   Physical Exam Updated Vital Signs BP 123/82 (BP Location: Left Arm)   Pulse 81   Temp 98.1 F (36.7 C) (Oral)   Resp 16   SpO2 98%   Physical Exam Vitals and nursing note reviewed.  Constitutional:      General: He is not in acute distress.    Appearance: He is well-developed. He is not ill-appearing, toxic-appearing or diaphoretic.  HENT:     Head: Normocephalic and atraumatic.     Nose: Nose normal. No congestion or rhinorrhea.  Eyes:     Conjunctiva/sclera: Conjunctivae normal.     Pupils: Pupils are equal, round, and reactive to light.  Cardiovascular:     Rate and Rhythm: Normal rate and regular rhythm.     Heart sounds: No murmur.  Pulmonary:     Effort: Pulmonary effort is normal. No respiratory distress.     Breath sounds: Normal breath sounds. No wheezing, rhonchi or rales.  Chest:     Chest wall: No tenderness.  Abdominal:     General: Abdomen is flat.     Palpations: Abdomen is soft.     Tenderness: There is no abdominal tenderness. There is no right CVA tenderness or left CVA tenderness.  Musculoskeletal:        General: Tenderness present. No signs of injury.     Cervical back: Neck supple. No tenderness.     Right lower leg: No edema.     Left lower leg: No edema.  Skin:    General: Skin is warm and dry.     Capillary Refill: Capillary refill takes less than 2 seconds.     Coloration: Skin is not pale.     Findings: No erythema.  Neurological:     General: No focal deficit present.     Mental Status: He is alert.  Psychiatric:        Mood and Affect: Mood normal.     ED Results / Procedures / Treatments   Labs (all labs ordered are listed, but only abnormal results are displayed) Labs Reviewed  COMPREHENSIVE METABOLIC PANEL   CBC WITH DIFFERENTIAL/PLATELET  RETICULOCYTES    EKG None  Radiology No results found.  Procedures Procedures (including critical care time)  Medications Ordered in ED Medications  sodium chloride flush (NS) 0.9 % injection 3 mL (has no administration in time range)  dextrose 5 %-0.45 % sodium chloride infusion (has no administration in time range)  HYDROmorphone (DILAUDID) injection 2 mg (has no administration in time range)  HYDROmorphone (DILAUDID) injection 2 mg (has no administration in time range)  ketorolac (TORADOL) 15 MG/ML injection 15 mg (has no administration in time range)  diphenhydrAMINE (  BENADRYL) capsule 25-50 mg (has no administration in time range)  promethazine (PHENERGAN) tablet 25 mg (has no administration in time range)    ED Course  I have reviewed the triage vital signs and the nursing notes.  Pertinent labs & imaging results that were available during my care of the patient were reviewed by me and considered in my medical decision making (see chart for details).    MDM Rules/Calculators/A&P                      Austin [name] is a 29 y.o. male with a past medical history significant for sickle cell disease who presents with pain.  Patient reports that he had sickle cell pain within the last few weeks similar in his bilateral legs in his low back.  He reports that this is the same.  He recently had ultrasounds were negative for DVT.  He says that yesterday he had pain but improved and then today he had pain that is uncontrolled with home pain medicines.  He reports the pain is 10 out of 10 in his bilateral legs and his low back.  He denies any fevers, chills, chest pain, shortness of breath, cough, palpitations, nausea vomiting, urinary symptoms or GI symptoms.  He says this feels consistent with his severe sickle cell pain.  On exam, lungs clear and chest nontender.  Abdomen nontender.  Patient has tenderness in his paraspinal back bilaterally and  both of his legs.  Good pulses in all extremities.  Normal sensation throughout.  Patient vital signs reassuring on monitor evaluation he is afebrile and nontachycardic.  Clinical aspect sickle cell pain crisis.  Patient will have screening labs and will be given pain medication to try alleviate his symptoms.  When asked, patient says he is unsure if he is going to feel well and to go home tonight but we are going to try and help him.  Care transferred to oncoming team while awaiting for reassessment after medications and labs.       Final Clinical Impression(s) / ED Diagnoses Final diagnoses:  Sickle cell pain crisis (HCC)    Clinical Impression: 1. Sickle cell pain crisis (HCC)     Disposition: Admit  This note was prepared with assistance of Dragon voice recognition software. Occasional wrong-word or sound-a-like substitutions may have occurred due to the inherent limitations of voice recognition software.      Sani Madariaga, Canary Brim, MD 11/07/19 [phone]

## 2019-11-07 DIAGNOSIS — D5700 Hb-SS disease with crisis, unspecified: Secondary | ICD-10-CM | POA: Diagnosis not present

## 2019-11-07 DIAGNOSIS — E876 Hypokalemia: Secondary | ICD-10-CM | POA: Diagnosis not present

## 2019-11-07 DIAGNOSIS — Z79899 Other long term (current) drug therapy: Secondary | ICD-10-CM | POA: Diagnosis not present

## 2019-11-07 DIAGNOSIS — Z87891 Personal history of nicotine dependence: Secondary | ICD-10-CM | POA: Diagnosis not present

## 2019-11-07 LAB — CBC WITH DIFFERENTIAL/PLATELET
Abs Immature Granulocytes: 0.03 10*3/uL (ref 0.00–0.07)
Basophils Absolute: 0 10*3/uL (ref 0.0–0.1)
Basophils Relative: 0 %
Eosinophils Absolute: 0.4 10*3/uL (ref 0.0–0.5)
Eosinophils Relative: 4 %
HCT: 33 % — ABNORMAL LOW (ref 39.0–52.0)
Hemoglobin: 12.1 g/dL — ABNORMAL LOW (ref 13.0–17.0)
Immature Granulocytes: 0 %
Lymphocytes Relative: 38 %
Lymphs Abs: 3.5 10*3/uL (ref 0.7–4.0)
MCH: 29.4 pg (ref 26.0–34.0)
MCHC: 36.7 g/dL — ABNORMAL HIGH (ref 30.0–36.0)
MCV: 80.1 fL (ref 80.0–100.0)
Monocytes Absolute: 0.7 10*3/uL (ref 0.1–1.0)
Monocytes Relative: 8 %
Neutro Abs: 4.5 10*3/uL (ref 1.7–7.7)
Neutrophils Relative %: 50 %
Platelets: 128 10*3/uL — ABNORMAL LOW (ref 150–400)
RBC: 4.12 MIL/uL — ABNORMAL LOW (ref 4.22–5.81)
RDW: 14.6 % (ref 11.5–15.5)
WBC: 9.1 10*3/uL (ref 4.0–10.5)
nRBC: 0.5 % — ABNORMAL HIGH (ref 0.0–0.2)

## 2019-11-07 LAB — COMPREHENSIVE METABOLIC PANEL WITH GFR
ALT: 21 U/L (ref 0–44)
AST: 11 U/L — ABNORMAL LOW (ref 15–41)
Albumin: 4.3 g/dL (ref 3.5–5.0)
Alkaline Phosphatase: 51 U/L (ref 38–126)
Anion gap: 10 (ref 5–15)
BUN: 10 mg/dL (ref 6–20)
CO2: 24 mmol/L (ref 22–32)
Calcium: 8.8 mg/dL — ABNORMAL LOW (ref 8.9–10.3)
Chloride: 104 mmol/L (ref 98–111)
Creatinine, Ser: 0.85 mg/dL (ref 0.61–1.24)
GFR calc Af Amer: >60 mL/min
GFR calc non Af Amer: >60 mL/min
Glucose, Bld: 140 mg/dL — ABNORMAL HIGH (ref 70–99)
Potassium: 3.2 mmol/L — ABNORMAL LOW (ref 3.5–5.1)
Sodium: 138 mmol/L (ref 135–145)
Total Bilirubin: 1.2 mg/dL (ref 0.3–1.2)
Total Protein: 7.4 g/dL (ref 6.5–8.1)

## 2019-11-07 LAB — RETICULOCYTES
Immature Retic Fract: 28.6 % — ABNORMAL HIGH (ref 2.3–15.9)
RBC.: 4.18 MIL/uL — ABNORMAL LOW (ref 4.22–5.81)
Retic Count, Absolute: 206.1 K/uL — ABNORMAL HIGH (ref 19.0–186.0)
Retic Ct Pct: 4.9 % — ABNORMAL HIGH (ref 0.4–3.1)

## 2019-11-07 MED ORDER — HYDROMORPHONE HCL 2 MG/ML IJ SOLN: 2.0000 mg | INTRAMUSCULAR | Status: DC

## 2019-11-07 MED ORDER — DEXTROSE-NACL 5-0.45 % IV SOLN: INTRAVENOUS | Status: DC

## 2019-11-07 MED ORDER — POTASSIUM CHLORIDE CRYS ER 20 MEQ PO TBCR
40.0000 meq | EXTENDED_RELEASE_TABLET | Freq: Once | ORAL | Status: AC
  Administered 2019-11-07: 40 meq via ORAL
  Filled 2019-11-07: qty 2

## 2019-11-07 MED ORDER — KETOROLAC TROMETHAMINE 15 MG/ML IJ SOLN
15.0000 mg | INTRAMUSCULAR | Status: AC
  Administered 2019-11-07: 15 mg via INTRAVENOUS
  Filled 2019-11-07: qty 1

## 2019-11-07 MED ORDER — HYDROMORPHONE HCL 2 MG/ML IJ SOLN
2.0000 mg | INTRAMUSCULAR | Status: AC
  Administered 2019-11-07: 2 mg via SUBCUTANEOUS
  Filled 2019-11-07: qty 1

## 2019-11-07 MED ORDER — HYDROMORPHONE HCL 2 MG/ML IJ SOLN
2.0000 mg | Freq: Once | INTRAMUSCULAR | Status: AC
  Administered 2019-11-07: 2 mg via SUBCUTANEOUS
  Filled 2019-11-07: qty 1

## 2019-11-07 MED ORDER — PROMETHAZINE HCL 25 MG PO TABS
25.0000 mg | ORAL_TABLET | ORAL | Status: DC | PRN
  Administered 2019-11-07: 25 mg via ORAL
  Filled 2019-11-07: qty 1

## 2019-11-07 MED ORDER — DIPHENHYDRAMINE HCL 25 MG PO CAPS
25.0000 mg | ORAL_CAPSULE | ORAL | Status: DC | PRN
  Administered 2019-11-07: 25 mg via ORAL
  Filled 2019-11-07: qty 1

## 2019-11-07 NOTE — ED Provider Notes (Signed)
  Nursing notes and vitals signs, including pulse oximetry, reviewed.  Summary of this visit's results, reviewed by myself:  EKG:  EKG Interpretation  Date/Time:    Ventricular Rate:    PR Interval:    QRS Duration:   QT Interval:    QTC Calculation:   R Axis:     Text Interpretation:         Labs:  Results for orders placed or performed during the hospital encounter of 11/06/19 (from the past 24 hour(s))  Comprehensive metabolic panel     Status: Abnormal   Collection Time: 11/07/19 12:45 AM  Result Value Ref Range   Sodium 138 135 - 145 mmol/L   Potassium 3.2 (L) 3.5 - 5.1 mmol/L   Chloride 104 98 - 111 mmol/L   CO2 24 22 - 32 mmol/L   Glucose, Bld 140 (H) 70 - 99 mg/dL   BUN 10 6 - 20 mg/dL   Creatinine, Ser 5.63 0.61 - 1.24 mg/dL   Calcium 8.8 (L) 8.9 - 10.3 mg/dL   Total Protein 7.4 6.5 - 8.1 g/dL   Albumin 4.3 3.5 - 5.0 g/dL   AST 11 (L) 15 - 41 U/L   ALT 21 0 - 44 U/L   Alkaline Phosphatase 51 38 - 126 U/L   Total Bilirubin 1.2 0.3 - 1.2 mg/dL   GFR calc non Af Amer >60 >60 mL/min   GFR calc Af Amer >60 >60 mL/min   Anion gap 10 5 - 15  CBC with Differential     Status: Abnormal   Collection Time: 11/07/19 12:45 AM  Result Value Ref Range   WBC 9.1 4.0 - 10.5 K/uL   RBC 4.12 (L) 4.22 - 5.81 MIL/uL   Hemoglobin 12.1 (L) 13.0 - 17.0 g/dL   HCT 14.9 (L) 70.2 - 63.7 %   MCV 80.1 80.0 - 100.0 fL   MCH 29.4 26.0 - 34.0 pg   MCHC 36.7 (H) 30.0 - 36.0 g/dL   RDW 85.8 85.0 - 27.7 %   Platelets 128 (L) 150 - 400 K/uL   nRBC 0.5 (H) 0.0 - 0.2 %   Neutrophils Relative % 50 %   Neutro Abs 4.5 1.7 - 7.7 K/uL   Lymphocytes Relative 38 %   Lymphs Abs 3.5 0.7 - 4.0 K/uL   Monocytes Relative 8 %   Monocytes Absolute 0.7 0.1 - 1.0 K/uL   Eosinophils Relative 4 %   Eosinophils Absolute 0.4 0.0 - 0.5 K/uL   Basophils Relative 0 %   Basophils Absolute 0.0 0.0 - 0.1 K/uL   Immature Granulocytes 0 %   Abs Immature Granulocytes 0.03 0.00 - 0.07 K/uL  Reticulocytes      Status: Abnormal   Collection Time: 11/07/19 12:45 AM  Result Value Ref Range   Retic Ct Pct 4.9 (H) 0.4 - 3.1 %   RBC. 4.18 (L) 4.22 - 5.81 MIL/uL   Retic Count, Absolute 206.1 (H) 19.0 - 186.0 K/uL   Immature Retic Fract 28.6 (H) 2.3 - 15.9 %    Imaging Studies: No results found.  3:47 AM Patient states he is ready to go home now.  He has had 3 doses of Dilaudid.  He was also given supplemental potassium for mild hypokalemia.   Johneric Mcfadden, Jonny Ruiz, MD 11/07/19 [phone]

## 2019-11-08 ENCOUNTER — Encounter: Payer: Self-pay | Admitting: Family Medicine

## 2019-11-16 ENCOUNTER — Telehealth: Payer: Self-pay | Admitting: Family Medicine

## 2019-11-16 ENCOUNTER — Ambulatory Visit: Payer: 59 | Admitting: Family Medicine

## 2019-11-16 ENCOUNTER — Other Ambulatory Visit: Payer: Self-pay | Admitting: Nurse Practitioner

## 2019-11-16 DIAGNOSIS — D571 Sickle-cell disease without crisis: Secondary | ICD-10-CM

## 2019-11-16 DIAGNOSIS — R112 Nausea with vomiting, unspecified: Secondary | ICD-10-CM

## 2019-11-16 DIAGNOSIS — G894 Chronic pain syndrome: Secondary | ICD-10-CM

## 2019-11-16 DIAGNOSIS — F1190 Opioid use, unspecified, uncomplicated: Secondary | ICD-10-CM

## 2019-11-16 MED ORDER — PROMETHAZINE HCL 25 MG PO TABS: 25.0000 mg | ORAL_TABLET | Freq: Four times a day (QID) | ORAL | 0 refills | Status: DC | PRN

## 2019-11-16 MED ORDER — OXYCODONE HCL 10 MG PO TABS: 10.0000 mg | ORAL_TABLET | Freq: Four times a day (QID) | ORAL | 0 refills | Status: DC | PRN

## 2019-11-16 NOTE — Telephone Encounter (Signed)
Sent!

## 2019-11-23 ENCOUNTER — Other Ambulatory Visit: Payer: Self-pay

## 2019-11-23 ENCOUNTER — Encounter: Payer: Self-pay | Admitting: Family Medicine

## 2019-11-23 ENCOUNTER — Ambulatory Visit (INDEPENDENT_AMBULATORY_CARE_PROVIDER_SITE_OTHER): Payer: 59 | Admitting: Family Medicine

## 2019-11-23 VITALS — BP 117/68 | HR 90 | Temp 97.9°F | Ht 67.0 in | Wt 180.0 lb

## 2019-11-23 DIAGNOSIS — Z09 Encounter for follow-up examination after completed treatment for conditions other than malignant neoplasm: Secondary | ICD-10-CM

## 2019-11-23 DIAGNOSIS — M79604 Pain in right leg: Secondary | ICD-10-CM

## 2019-11-23 DIAGNOSIS — G894 Chronic pain syndrome: Secondary | ICD-10-CM

## 2019-11-23 DIAGNOSIS — D571 Sickle-cell disease without crisis: Secondary | ICD-10-CM | POA: Diagnosis not present

## 2019-11-23 DIAGNOSIS — R110 Nausea: Secondary | ICD-10-CM

## 2019-11-23 DIAGNOSIS — E860 Dehydration: Secondary | ICD-10-CM

## 2019-11-23 DIAGNOSIS — M79605 Pain in left leg: Secondary | ICD-10-CM | POA: Diagnosis not present

## 2019-11-23 DIAGNOSIS — R112 Nausea with vomiting, unspecified: Secondary | ICD-10-CM | POA: Diagnosis not present

## 2019-11-23 DIAGNOSIS — F1190 Opioid use, unspecified, uncomplicated: Secondary | ICD-10-CM

## 2019-11-23 LAB — POCT URINALYSIS DIPSTICK
Bilirubin, UA: NEGATIVE
Blood, UA: NEGATIVE
Glucose, UA: NEGATIVE
Ketones, UA: NEGATIVE
Leukocytes, UA: NEGATIVE
Nitrite, UA: NEGATIVE
Protein, UA: NEGATIVE
Spec Grav, UA: 1.02 (ref 1.010–1.025)
Urobilinogen, UA: 0.2 E.U./dL
pH, UA: 7 (ref 5.0–8.0)

## 2019-11-23 NOTE — Progress Notes (Addendum)
[facility]    Established Patient Office Visit  Subjective:  Patient ID: Austin [name], male    DOB: April 14, 1991  Age: 29 y.o. MRN: [id number]  CC:  Chief Complaint  Patient presents with  . Follow-up    Sickle Cell   HPI Austin [name] is a 29 year old male who  presents for Follow Up today.   Past Medical History:  Diagnosis Date  . Acute maxillary sinusitis   . Eczema   . Sickle cell anemia (HCC) .    Current Status: Since his last office visit, he is doing well with no complaints. He states that he has pain in his arms and legs. He rates his pain today at 0/10. He has not had a hospital visit for Sickle Cell Crisis since 10/16/2019 where he was treated and discharged the same day. He is currently taking all medications as prescribed and staying well hydrated. He reports occasional nausea, constipation, dizziness and headaches. He denies fevers, chills, fatigue, recent infections, weight loss, and night sweats.  Denies GI problems such as diarrhea, and constipation. He has no reports of blood in stools, dysuria and hematuria. No depression or anxiety reported today. He denies suicidal ideations, homicidal ideations, or auditory hallucinations. He is taking all medications as prescribed. He denies pain today.   Past Surgical History:  Procedure Laterality Date  . WISDOM TOOTH EXTRACTION      Family History  Problem Relation Age of Onset  . Sickle cell trait Mother   . Diabetes Father   . Hypertension Father     Social History   Socioeconomic History  . Marital status: Married    Spouse name: Not on file  . Number of children: Not on file  . Years of education: Not on file  . Highest education level: Not on file  Occupational History  . Not on file  Tobacco Use  . Smoking status: Former Smoker    Packs/day: 0.10  . Smokeless tobacco: Never Used  Substance and Sexual Activity  . Alcohol use: Yes   Comment: occ  . Drug use: Yes    Types: Marijuana  . Sexual activity: Yes  Other Topics Concern  . Not on file  Social History Narrative  . Not on file   Social Determinants of Health   Financial Resource Strain:   . Difficulty of Paying Living Expenses:   Food Insecurity:   . Worried About Programme researcher, broadcasting/film/video in the Last Year:   . Barista in the Last Year:   Transportation Needs:   . Freight forwarder (Medical):   Marland Kitchen Lack of Transportation (Non-Medical):   Physical Activity:   . Days of Exercise per Week:   . Minutes of Exercise per Session:   Stress:   . Feeling of Stress :   Social Connections:   . Frequency of Communication with Friends and Family:   . Frequency of Social Gatherings with Friends and Family:   . Attends Religious Services:   . Active Member of Clubs or Organizations:   . Attends Banker Meetings:   Marland Kitchen Marital Status:   Intimate Partner Violence:   . Fear of Current or Ex-Partner:   . Emotionally Abused:   Marland Kitchen Physically Abused:   . Sexually Abused:     Outpatient Medications Prior to Visit  Medication Sig Dispense Refill  . folic acid (FOLVITE) 1 MG tablet Take 1 tablet (1 mg  total) by mouth daily. 30 tablet 11  . ibuprofen (ADVIL,MOTRIN) 800 MG tablet Take 1 tablet (800 mg total) by mouth every 8 (eight) hours as needed. 30 tablet 1  . loratadine (CLARITIN) 10 MG tablet Take 1 tablet (10 mg total) by mouth daily. 30 tablet 6  . ondansetron (ZOFRAN) 4 MG tablet Take 1 tablet (4 mg total) by mouth every 8 (eight) hours as needed for nausea or vomiting. 20 tablet 3  . Oxycodone HCl 10 MG TABS Take 1 tablet (10 mg total) by mouth every 6 (six) hours as needed for up to 15 days (pain). 60 tablet 0  . promethazine (PHENERGAN) 25 MG tablet Take 1 tablet (25 mg total) by mouth every 6 (six) hours as needed for nausea or vomiting. 30 tablet 0  . tetrahydrozoline 0.05 % ophthalmic solution Place 1 drop into both eyes 2 (two) times daily  as needed (Red eyes).     No facility-administered medications prior to visit.    No Known Allergies  ROS Review of Systems  Constitutional: Negative.   HENT: Negative.   Eyes: Negative.   Respiratory: Negative.   Cardiovascular: Negative.   Gastrointestinal: Positive for constipation (occasional ) and nausea.  Endocrine: Negative.   Genitourinary: Negative.   Musculoskeletal: Positive for arthralgias (generalizedi in joints. ).  Skin: Negative.   Allergic/Immunologic: Negative.   Neurological: Positive for dizziness (occasional ) and headaches (occasional ).  Hematological: Negative.   Psychiatric/Behavioral: Positive for behavioral problems.      Objective:    Physical Exam  Constitutional: He is oriented to person, place, and time. He appears well-developed and well-nourished.  HENT:  Head: Normocephalic and atraumatic.  Eyes: Conjunctivae are normal.  Cardiovascular: Normal rate, regular rhythm, normal heart sounds and intact distal pulses.  Pulmonary/Chest: Effort normal and breath sounds normal.  Abdominal: Soft. Bowel sounds are normal.  Musculoskeletal:        General: Normal range of motion.     Cervical back: Normal range of motion and neck supple.  Neurological: He is alert and oriented to person, place, and time. He has normal reflexes.  Skin: Skin is warm and dry.  Psychiatric: He has a normal mood and affect. His behavior is normal. Judgment and thought content normal.  Nursing note and vitals reviewed.   BP 117/68   Pulse 90   Temp 97.9 F (36.6 C)   Ht 5\' 7"  (1.702 m)   Wt 180 lb (81.6 kg)   SpO2 100%   BMI 28.19 kg/m  Wt Readings from Last 3 Encounters:  11/23/19 180 lb (81.6 kg)  10/17/19 183 lb (83 kg)  09/18/19 179 lb (81.2 kg)     Health Maintenance Due  Topic Date Due  . COVID-19 Vaccine (1) Never done    There are no preventive care reminders to display for this patient.  Lab Results  Component Value Date   TSH 0.662  08/12/2017   Lab Results  Component Value Date   WBC 9.1 11/07/2019   HGB 12.1 (L) 11/07/2019   HCT 33.0 (L) 11/07/2019   MCV 80.1 11/07/2019   PLT 128 (L) 11/07/2019   Lab Results  Component Value Date   NA 138 11/07/2019   K 3.2 (L) 11/07/2019   CO2 24 11/07/2019   GLUCOSE 140 (H) 11/07/2019   BUN 10 11/07/2019   CREATININE 0.85 11/07/2019   BILITOT 1.2 11/07/2019   ALKPHOS 51 11/07/2019   AST 11 (L) 11/07/2019   ALT 21 11/07/2019  PROT 7.4 11/07/2019   ALBUMIN 4.3 11/07/2019   CALCIUM 8.8 (L) 11/07/2019   ANIONGAP 10 11/07/2019   No results found for: CHOL No results found for: HDL No results found for: LDLCALC No results found for: TRIG No results found for: [facility] Lab Results  Component Value Date   HGBA1C 4.2 08/12/2017      Assessment & Plan:   1. Sickle cell anemia without crisis [facility]) He is doing well today r/t his chronic pain management. He will continue to take pain medications as prescribed; will continue to avoid extreme heat and cold; will continue to eat a healthy diet and drink at least 64 ounces of water daily; continue stool softener as needed; will avoid colds and flu; will continue to get plenty of sleep and rest; will continue to avoid high stressful situations and remain infection free; will continue Folic Acid 1 mg daily to avoid sickle cell crisis. Continue to follow up with Hematologist as needed.  - POCT Urinalysis Dipstick  2. Dehydration - POCT Urinalysis Dipstick  3. Chronic pain syndrome  4. Chronic, continuous use of opioids  5. Nausea and vomiting, intractability of vomiting not specified, unspecified vomiting type Stable today.   6. Bilateral lower extremity pain Resolved.   7. Nausea Stable.   8. Follow up He will follow up in 2 months.   No orders of the defined types were placed in this encounter.   Orders Placed This Encounter  Procedures  . POCT Urinalysis Dipstick    Referral Orders  No referral(s)  requested today    Kathe Becton,  MSN, FNP-BC [place], [place] [address] [phone]- fax   Problem List Items Addressed This Visit      Other   Bilateral lower extremity pain   Chronic pain syndrome   Chronic, continuous use of opioids   Dehydration   Relevant Orders   POCT Urinalysis Dipstick (Completed)   Nausea   Sickle cell anemia without crisis ([place]) - Primary   Relevant Orders   POCT Urinalysis Dipstick (Completed)    Other Visit Diagnoses    Nausea and vomiting, intractability of vomiting not specified, unspecified vomiting type       Follow up          No orders of the defined types were placed in this encounter.   Follow-up: Return in about 2 months (around 01/23/2020).    Azzie Glatter, FNP

## 2019-11-29 ENCOUNTER — Telehealth: Payer: Self-pay

## 2019-11-29 NOTE — Telephone Encounter (Signed)
Two messages left for Austin [name] with [employer] to call or fax the [facility] back on 11/26/2019.  For any additional information needed for Mr. Hayter leave.   Call placed again on 11/27/2019 & 11/29/2019.  No reply as of yet.    [employer] Ph# [phone] ext [phone].

## 2019-12-04 ENCOUNTER — Telehealth: Payer: Self-pay | Admitting: Family Medicine

## 2019-12-04 NOTE — Telephone Encounter (Signed)
Pt's FMLA paperwork finally received today. Pt needs it done by tomorrow. Pt also asked for refill on oxycodone 10mg .

## 2019-12-05 ENCOUNTER — Other Ambulatory Visit: Payer: Self-pay | Admitting: Family Medicine

## 2019-12-05 DIAGNOSIS — D571 Sickle-cell disease without crisis: Secondary | ICD-10-CM

## 2019-12-05 DIAGNOSIS — G894 Chronic pain syndrome: Secondary | ICD-10-CM

## 2019-12-05 DIAGNOSIS — F1190 Opioid use, unspecified, uncomplicated: Secondary | ICD-10-CM

## 2019-12-05 MED ORDER — OXYCODONE HCL 10 MG PO TABS: 10.0000 mg | ORAL_TABLET | Freq: Four times a day (QID) | ORAL | 0 refills | Status: DC | PRN

## 2019-12-13 IMAGING — CR DG CHEST 2V
2 series · 2 of 2 positions shown · non-contrast
Comparison: Chest x-ray of November 24, 2016

CLINICAL DATA: Sickle cell anemia. Chest and left back pain for 2
days. Former smoker of tobacco. Current marijuana smoker.

EXAM:
CHEST - 2 VIEW

[w chest pa]
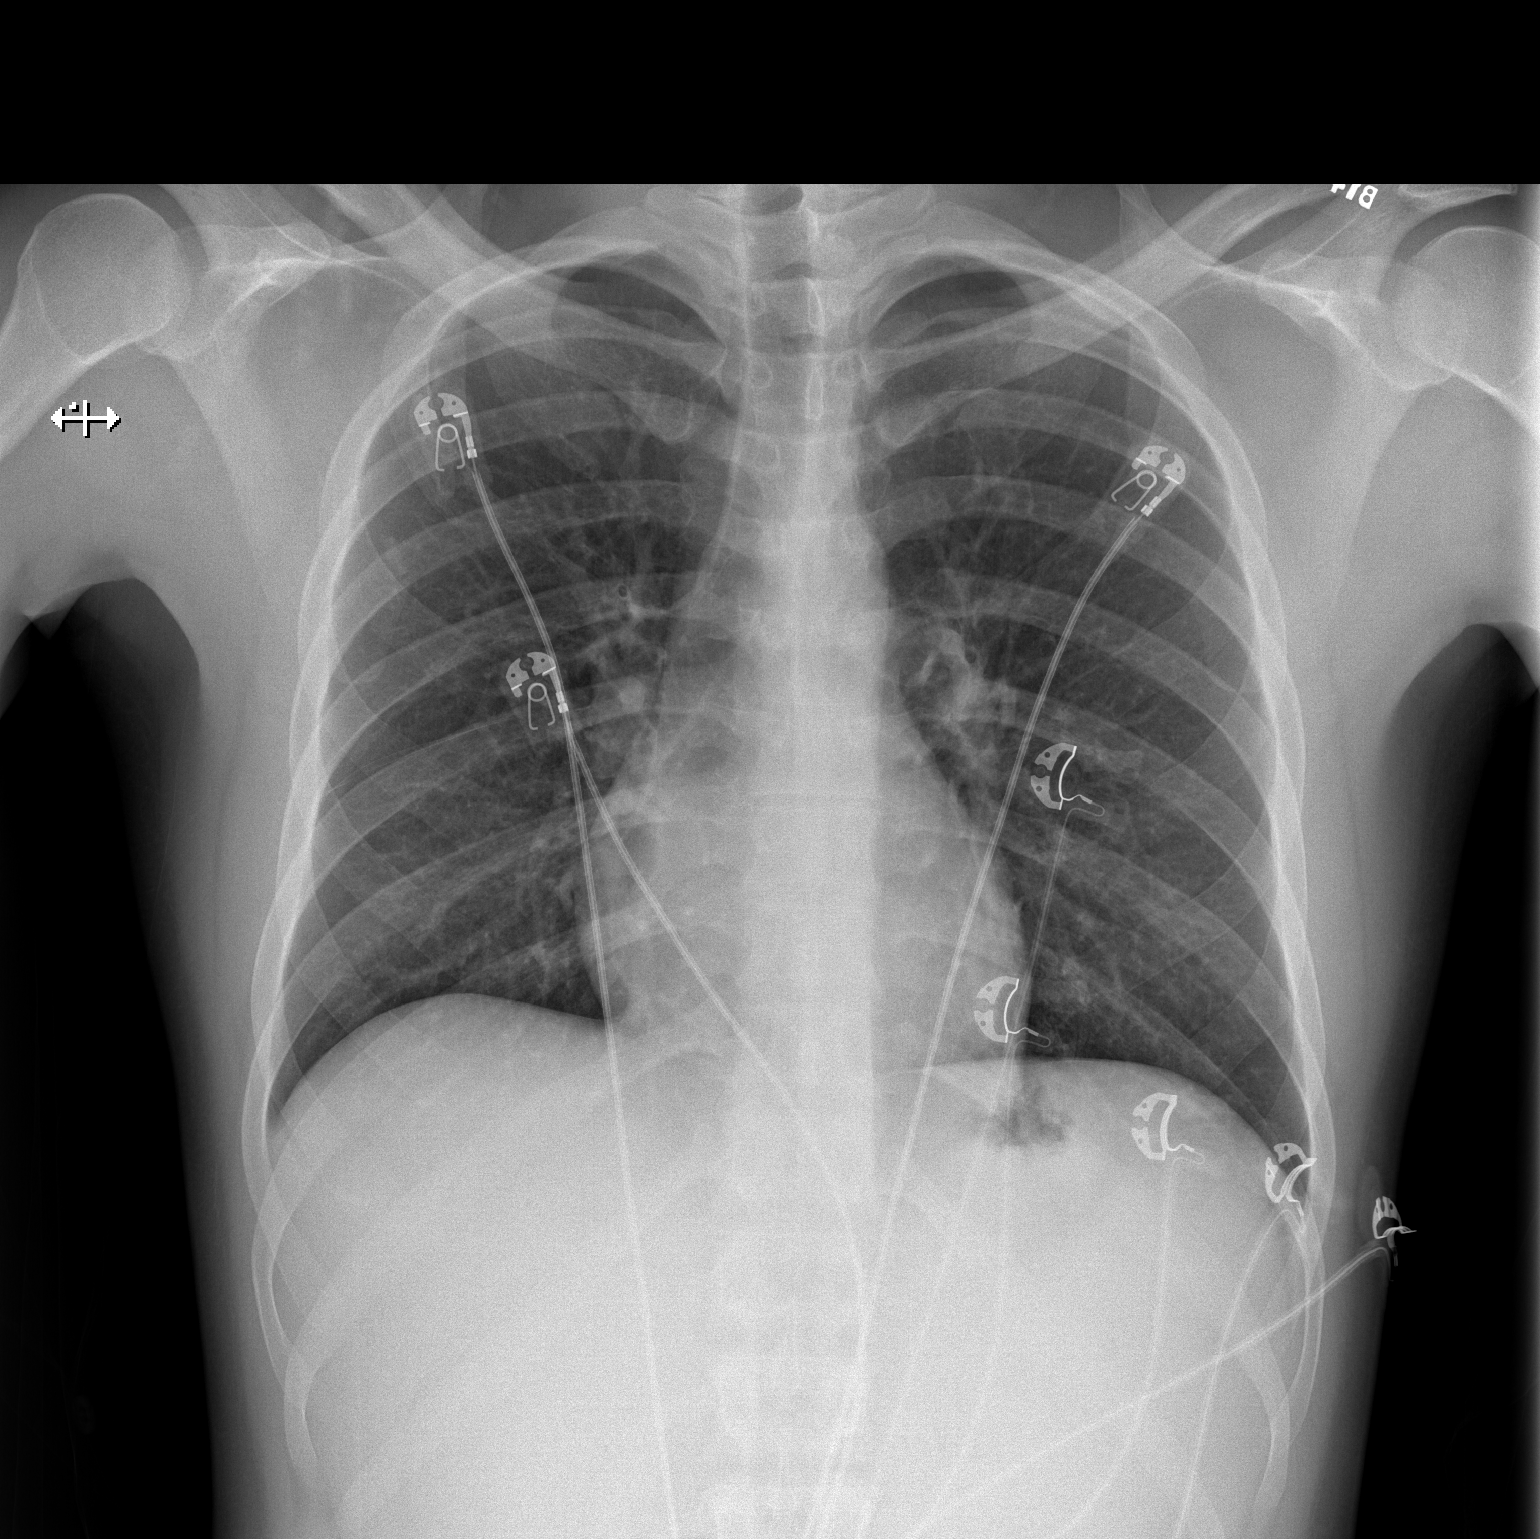

[w chest lat]
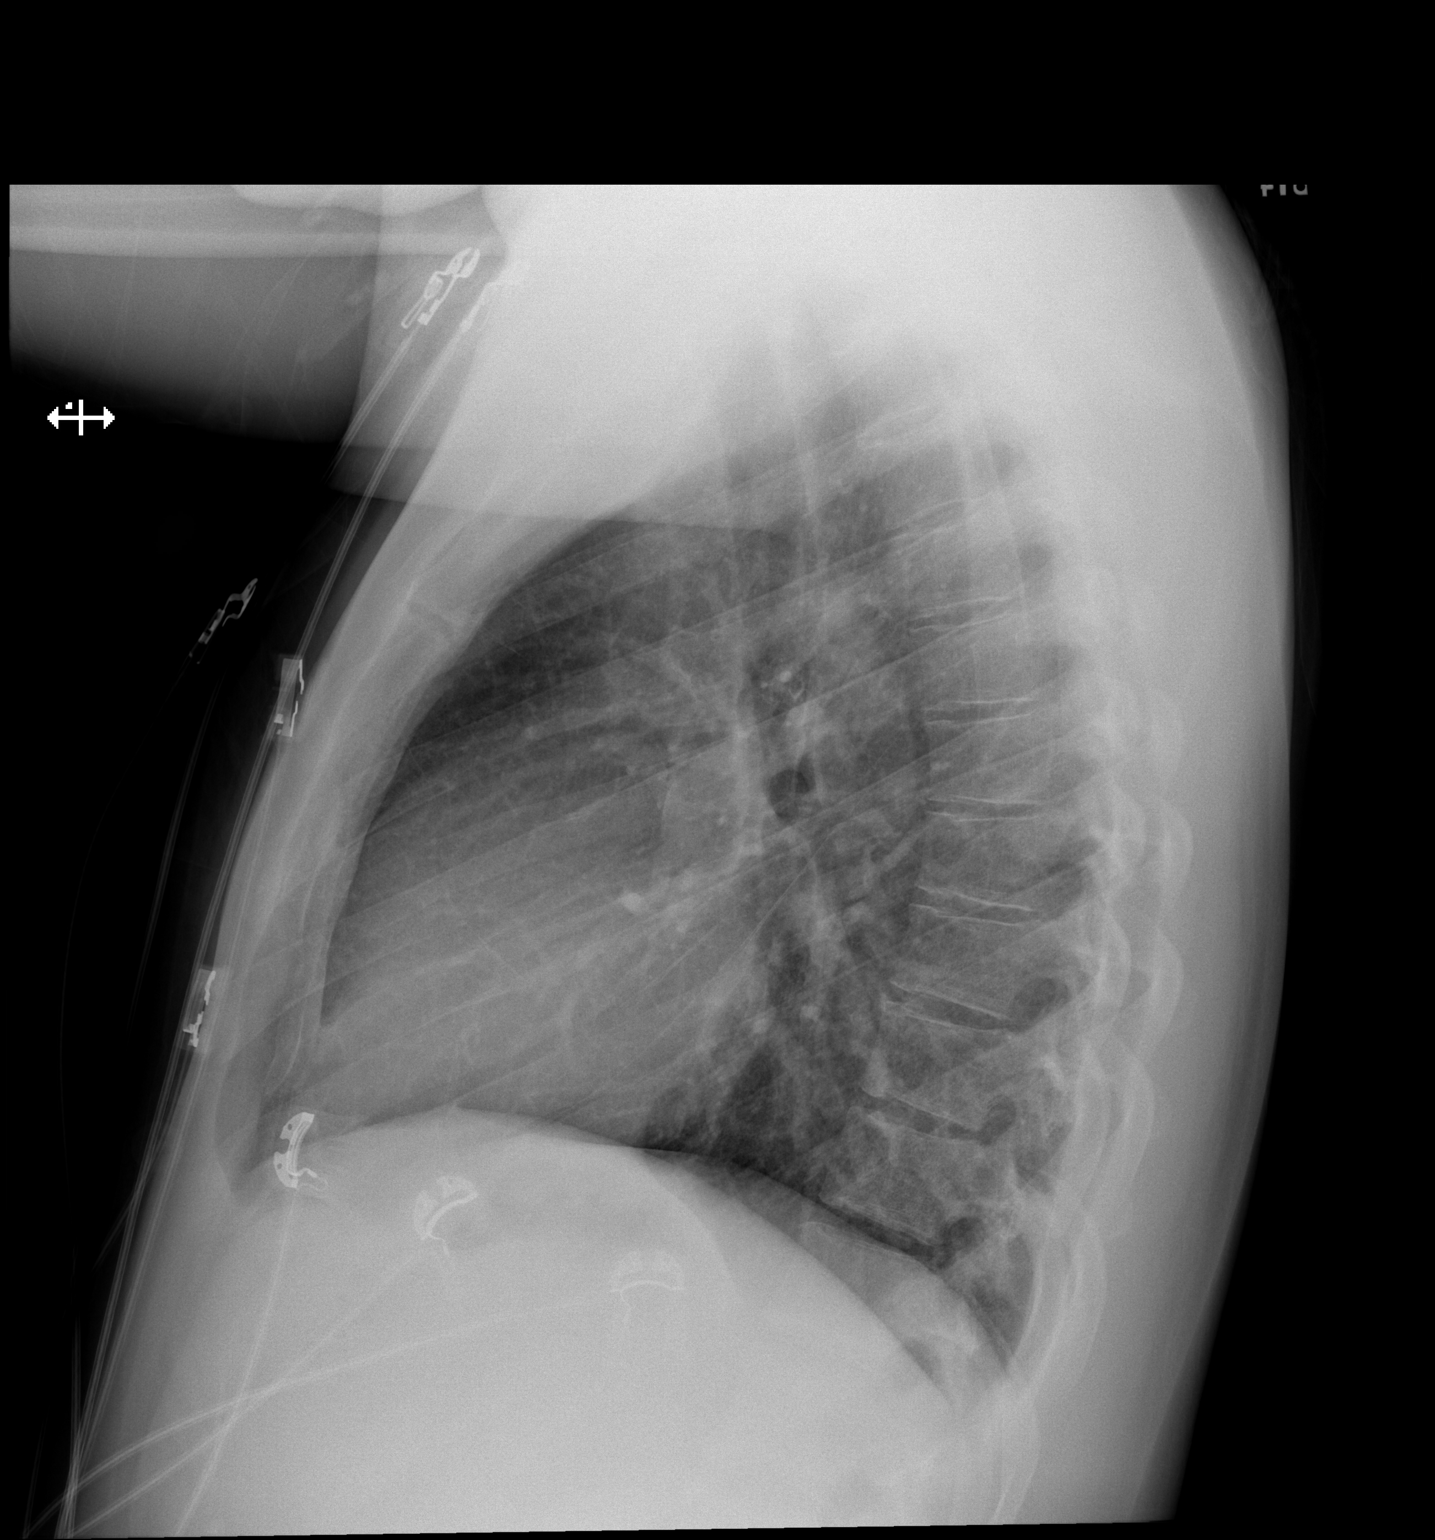

[2 of 2 positions shown; findings below may reference images not displayed]

FINDINGS: The lungs are adequately inflated. The interstitial markings are
coarse. The heart and pulmonary vascularity are normal. The
mediastinum is normal in width. There is no pleural effusion. The
bony thorax exhibits no acute abnormality.
IMPRESSION: Mild chronic interstitial prominence consistent with the smoking
history as well as sickle cell disease. There is no active
cardiopulmonary disease.

## 2019-12-20 ENCOUNTER — Telehealth: Payer: Self-pay | Admitting: Family Medicine

## 2019-12-20 ENCOUNTER — Other Ambulatory Visit: Payer: Self-pay | Admitting: Family Medicine

## 2019-12-20 DIAGNOSIS — F1190 Opioid use, unspecified, uncomplicated: Secondary | ICD-10-CM

## 2019-12-20 DIAGNOSIS — G894 Chronic pain syndrome: Secondary | ICD-10-CM

## 2019-12-20 DIAGNOSIS — R112 Nausea with vomiting, unspecified: Secondary | ICD-10-CM

## 2019-12-20 DIAGNOSIS — D571 Sickle-cell disease without crisis: Secondary | ICD-10-CM

## 2019-12-20 MED ORDER — PROMETHAZINE HCL 25 MG PO TABS: 25.0000 mg | ORAL_TABLET | Freq: Four times a day (QID) | ORAL | 0 refills | Status: DC | PRN

## 2019-12-20 MED ORDER — OXYCODONE HCL 10 MG PO TABS: 10.0000 mg | ORAL_TABLET | Freq: Four times a day (QID) | ORAL | 0 refills | Status: DC | PRN

## 2019-12-20 NOTE — Telephone Encounter (Signed)
Pt called in medication refill for oxycodone 10mg  and promethazine

## 2020-01-07 ENCOUNTER — Telehealth: Payer: Self-pay | Admitting: Family Medicine

## 2020-01-07 NOTE — Telephone Encounter (Signed)
Pt called in refill on oxycodone 

## 2020-01-08 ENCOUNTER — Other Ambulatory Visit: Payer: Self-pay | Admitting: Family Medicine

## 2020-01-08 ENCOUNTER — Telehealth: Payer: Self-pay | Admitting: Family Medicine

## 2020-01-08 DIAGNOSIS — F1190 Opioid use, unspecified, uncomplicated: Secondary | ICD-10-CM

## 2020-01-08 DIAGNOSIS — G894 Chronic pain syndrome: Secondary | ICD-10-CM

## 2020-01-08 DIAGNOSIS — D571 Sickle-cell disease without crisis: Secondary | ICD-10-CM

## 2020-01-08 MED ORDER — OXYCODONE HCL 10 MG PO TABS: 10.0000 mg | ORAL_TABLET | Freq: Four times a day (QID) | ORAL | 0 refills | Status: DC | PRN

## 2020-01-09 NOTE — Telephone Encounter (Signed)
Done

## 2020-01-17 ENCOUNTER — Encounter (HOSPITAL_COMMUNITY): Payer: Self-pay

## 2020-01-17 ENCOUNTER — Emergency Department (HOSPITAL_COMMUNITY)
Admission: EM | Admit: 2020-01-17 | Discharge: 2020-01-17 | Disposition: A | Discharge status: Home / Self Care | Payer: 59 | Attending: Emergency Medicine | Admitting: Emergency Medicine

## 2020-01-17 ENCOUNTER — Other Ambulatory Visit: Payer: Self-pay

## 2020-01-17 DIAGNOSIS — L309 Dermatitis, unspecified: Secondary | ICD-10-CM | POA: Diagnosis not present

## 2020-01-17 DIAGNOSIS — J0100 Acute maxillary sinusitis, unspecified: Secondary | ICD-10-CM | POA: Diagnosis not present

## 2020-01-17 DIAGNOSIS — Z87891 Personal history of nicotine dependence: Secondary | ICD-10-CM | POA: Insufficient documentation

## 2020-01-17 DIAGNOSIS — D57219 Sickle-cell/Hb-C disease with crisis, unspecified: Secondary | ICD-10-CM | POA: Insufficient documentation

## 2020-01-17 DIAGNOSIS — D5700 Hb-SS disease with crisis, unspecified: Secondary | ICD-10-CM | POA: Diagnosis not present

## 2020-01-17 LAB — COMPREHENSIVE METABOLIC PANEL
ALT: 28 U/L (ref 0–44)
AST: 16 U/L (ref 15–41)
Albumin: 4.6 g/dL (ref 3.5–5.0)
Alkaline Phosphatase: 52 U/L (ref 38–126)
Anion gap: 9 (ref 5–15)
BUN: 11 mg/dL (ref 6–20)
CO2: 22 mmol/L (ref 22–32)
Calcium: 9.4 mg/dL (ref 8.9–10.3)
Chloride: 104 mmol/L (ref 98–111)
Creatinine, Ser: 0.88 mg/dL (ref 0.61–1.24)
GFR calc Af Amer: >60 mL/min (ref >60)
GFR calc non Af Amer: >60 mL/min (ref >60)
Glucose, Bld: 103 mg/dL — ABNORMAL HIGH (ref 70–99)
Potassium: 4.5 mmol/L (ref 3.5–5.1)
Sodium: 135 mmol/L (ref 135–145)
Total Bilirubin: 1.4 mg/dL — ABNORMAL HIGH (ref 0.3–1.2)
Total Protein: 8.3 g/dL — ABNORMAL HIGH (ref 6.5–8.1)

## 2020-01-17 LAB — CBC WITH DIFFERENTIAL/PLATELET
Abs Immature Granulocytes: 0.04 10*3/uL (ref 0.00–0.07)
Basophils Absolute: 0 10*3/uL (ref 0.0–0.1)
Basophils Relative: 0 %
Eosinophils Absolute: 0.1 10*3/uL (ref 0.0–0.5)
Eosinophils Relative: 1 %
HCT: 38 % — ABNORMAL LOW (ref 39.0–52.0)
Hemoglobin: 13.4 g/dL (ref 13.0–17.0)
Immature Granulocytes: 0 %
Lymphocytes Relative: 26 %
Lymphs Abs: 2.4 10*3/uL (ref 0.7–4.0)
MCH: 28.3 pg (ref 26.0–34.0)
MCHC: 35.3 g/dL (ref 30.0–36.0)
MCV: 80.3 fL (ref 80.0–100.0)
Monocytes Absolute: 1 10*3/uL (ref 0.1–1.0)
Monocytes Relative: 12 %
Neutro Abs: 5.5 10*3/uL (ref 1.7–7.7)
Neutrophils Relative %: 61 %
Platelets: 144 10*3/uL — ABNORMAL LOW (ref 150–400)
RBC: 4.73 MIL/uL (ref 4.22–5.81)
RDW: 14.8 % (ref 11.5–15.5)
WBC: 9.1 10*3/uL (ref 4.0–10.5)
nRBC: 0.7 % — ABNORMAL HIGH (ref 0.0–0.2)

## 2020-01-17 LAB — RETICULOCYTES
Immature Retic Fract: 29.6 % — ABNORMAL HIGH (ref 2.3–15.9)
RBC.: 4.78 MIL/uL (ref 4.22–5.81)
Retic Count, Absolute: 192.6 10*3/uL — ABNORMAL HIGH (ref 19.0–186.0)
Retic Ct Pct: 4 % — ABNORMAL HIGH (ref 0.4–3.1)

## 2020-01-17 MED ORDER — DIPHENHYDRAMINE HCL 25 MG PO CAPS
25.0000 mg | ORAL_CAPSULE | ORAL | Status: DC | PRN
  Filled 2020-01-17: qty 2

## 2020-01-17 MED ORDER — ONDANSETRON HCL 4 MG/2ML IJ SOLN
4.0000 mg | INTRAMUSCULAR | Status: DC | PRN
  Administered 2020-01-17: 4 mg via INTRAVENOUS
  Filled 2020-01-17: qty 2

## 2020-01-17 MED ORDER — KETOROLAC TROMETHAMINE 15 MG/ML IJ SOLN
15.0000 mg | INTRAMUSCULAR | Status: AC
  Administered 2020-01-17: 15 mg via INTRAVENOUS
  Filled 2020-01-17: qty 1

## 2020-01-17 MED ORDER — DEXTROSE-NACL 5-0.45 % IV SOLN: INTRAVENOUS | Status: DC

## 2020-01-17 MED ORDER — HYDROMORPHONE HCL 2 MG/ML IJ SOLN
2.0000 mg | INTRAMUSCULAR | Status: AC
  Administered 2020-01-17: 2 mg via INTRAVENOUS
  Filled 2020-01-17: qty 1

## 2020-01-17 NOTE — ED Triage Notes (Signed)
Pt complains of lower back pain that started last ngiht due to a sickle cell crisis

## 2020-01-17 NOTE — ED Provider Notes (Signed)
[facility]-EMERGENCY DEPT Provider Note   CSN: [id number] Arrival date & time: 01/17/20  0548     History No chief complaint on file.   Austin [name] is a 29 y.o. male.  The history is provided by the patient and medical records. No language interpreter was used.   Austin [name] is a 29 y.o. male who presents to the Emergency Department complaining of sickle cell pain crisis. He presents the emergency department complaining of low back pain that started two days ago, similar to prior sickle-cell pain crises. He also has pain and bilateral knees. He states that he is been having to work extra work due to a callout and this seems to be making his pain worse. He denies any fevers, chest pain, shortness of breath, nausea, vomiting, numbness, weakness. He takes oxycodone daily for his pain. No significant improvement with this medication currently.    Past Medical History:  Diagnosis Date  . Acute maxillary sinusitis   . Eczema   . Sickle cell anemia (HCC) .    Patient Active Problem List   Diagnosis Date Noted  . Bilateral lower extremity pain 10/17/2019  . Sickle cell anemia without crisis (HCC) 09/20/2019  . Chronic pain syndrome 09/20/2019  . Nausea 09/20/2019  . Chronic, continuous use of opioids 09/20/2019  . Medication management 03/20/2019  . Bilious vomiting without nausea   . Viral gastroenteritis 01/23/2019  . Nausea vomiting and diarrhea   . Generalized abdominal pain   . Hb-SS disease with crisis (HCC)   . Dehydration   . Sickle cell pain crisis (HCC) 05/18/2018  . Acute sickle cell crisis (HCC) 12/15/2017  . Thrombocytopenia (HCC) 12/15/2017  . Splenomegaly 12/15/2017  . Sickle cell anemia with crisis (HCC) 12/15/2017  . 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 Spleen enlarged 08/18/2017    Past Surgical History:  Procedure Laterality Date  . WISDOM TOOTH EXTRACTION         Family History  Problem Relation Age of Onset   . Sickle cell trait Mother   . Diabetes Father   . Hypertension Father     Social History   Tobacco Use  . Smoking status: Former Smoker    Packs/day: 0.10  . Smokeless tobacco: Never Used  Vaping Use  . Vaping Use: Never used  Substance Use Topics  . Alcohol use: Yes    Comment: occ  . Drug use: Yes    Types: Marijuana    Home Medications Prior to Admission medications   Medication Sig Start Date End Date Taking? Authorizing Provider  ibuprofen (ADVIL,MOTRIN) 800 MG tablet Take 1 tablet (800 mg total) by mouth every 8 (eight) hours as needed. 05/10/18  Yes Kallie Locks, FNP  loratadine (CLARITIN) 10 MG tablet Take 1 tablet (10 mg total) by mouth daily. Patient taking differently: Take 10 mg by mouth daily as needed for allergies.  10/03/18  Yes Kallie Locks, FNP  ondansetron (ZOFRAN) 4 MG tablet Take 1 tablet (4 mg total) by mouth every 8 (eight) hours as needed for nausea or vomiting. 07/09/19  Yes Kallie Locks, FNP  Oxycodone HCl 10 MG TABS Take 1 tablet (10 mg total) by mouth every 6 (six) hours as needed for up to 15 days (pain). 01/08/20 01/23/20 Yes Kallie Locks, FNP  promethazine (PHENERGAN) 25 MG tablet Take 1 tablet (25 mg total) by mouth every 6 (six) hours as needed for nausea or vomiting. 12/20/19  Yes Kallie Locks,  FNP  tetrahydrozoline 0.05 % ophthalmic solution Place 1 drop into both eyes 2 (two) times daily as needed (Red eyes).   Yes [provider]    Allergies    Patient has no known allergies.  Review of Systems   Review of Systems  All other systems reviewed and are negative.   Physical Exam Updated Vital Signs BP 133/80   Pulse 78   Temp 98 F (36.7 C) (Oral)   Resp 15   Ht 5\' 7"  (1.702 m)   Wt 83.9 kg   SpO2 99%   BMI 28.98 kg/m   Physical Exam Vitals and nursing note reviewed.  Constitutional:      Appearance: He is well-developed.  HENT:     Head: Normocephalic and atraumatic.  Cardiovascular:      Rate and Rhythm: Normal rate and regular rhythm.     Heart sounds: No murmur heard.   Pulmonary:     Effort: Pulmonary effort is normal. No respiratory distress.     Breath sounds: Normal breath sounds.  Abdominal:     Palpations: Abdomen is soft.     Tenderness: There is no abdominal tenderness. There is no guarding or rebound.  Musculoskeletal:        General: No tenderness.     Comments: 2+ DP pulses bilaterally. There is no significant swelling or tenderness to the knees bilaterally.  Skin:    General: Skin is warm and dry.  Neurological:     Mental Status: He is alert and oriented to person, place, and time.  Psychiatric:        Behavior: Behavior normal.     ED Results / Procedures / Treatments   Labs (all labs ordered are listed, but only abnormal results are displayed) Labs Reviewed  COMPREHENSIVE METABOLIC PANEL - Abnormal; Notable for the following components:      Result Value   Glucose, Bld 103 (*)    Total Protein 8.3 (*)    Total Bilirubin 1.4 (*)    All other components within normal limits  CBC WITH DIFFERENTIAL/PLATELET - Abnormal; Notable for the following components:   HCT 38.0 (*)    Platelets 144 (*)    nRBC 0.7 (*)    All other components within normal limits  RETICULOCYTES - Abnormal; Notable for the following components:   Retic Ct Pct 4.0 (*)    Retic Count, Absolute 192.6 (*)    Immature Retic Fract 29.6 (*)    All other components within normal limits    EKG None  Radiology No results found.  Procedures Procedures (including critical care time)  Medications Ordered in ED Medications  dextrose 5 %-0.45 % sodium chloride infusion ( Intravenous New Bag/Given 01/17/20 0753)  diphenhydrAMINE (BENADRYL) capsule 25-50 mg (has no administration in time range)  ondansetron (ZOFRAN) injection 4 mg (4 mg Intravenous Given 01/17/20 0754)  HYDROmorphone (DILAUDID) injection 2 mg (2 mg Intravenous Given 01/17/20 0754)  HYDROmorphone (DILAUDID)  injection 2 mg (2 mg Intravenous Given 01/17/20 0825)  ketorolac (TORADOL) 15 MG/ML injection 15 mg (15 mg Intravenous Given 01/17/20 0754)    ED Course  I have reviewed the triage vital signs and the nursing notes.  Pertinent labs & imaging results that were available during my care of the patient were reviewed by me and considered in my medical decision making (see chart for details).    MDM Rules/Calculators/A&P  Patient with history of sickle cell anemia here for evaluation of pain, similar to prior pain crises. His labs are near his baseline. On repeat assessment after pain medications he is feeling improved. Plan to discharge home with outpatient follow-up and return precautions. Final Clinical Impression(s) / ED Diagnoses Final diagnoses:  Sickle cell pain crisis [facility])    Rx / Dexter Orders ED Discharge Orders    None       Quintella Reichert, MD 01/17/20 [phone]

## 2020-01-23 ENCOUNTER — Encounter: Payer: Self-pay | Admitting: Family Medicine

## 2020-01-23 ENCOUNTER — Ambulatory Visit (INDEPENDENT_AMBULATORY_CARE_PROVIDER_SITE_OTHER): Payer: 59 | Admitting: Family Medicine

## 2020-01-23 ENCOUNTER — Other Ambulatory Visit: Payer: Self-pay

## 2020-01-23 VITALS — BP 144/88 | Temp 85.0°F

## 2020-01-23 DIAGNOSIS — M79604 Pain in right leg: Secondary | ICD-10-CM | POA: Diagnosis not present

## 2020-01-23 DIAGNOSIS — G894 Chronic pain syndrome: Secondary | ICD-10-CM

## 2020-01-23 DIAGNOSIS — R112 Nausea with vomiting, unspecified: Secondary | ICD-10-CM | POA: Diagnosis not present

## 2020-01-23 DIAGNOSIS — D571 Sickle-cell disease without crisis: Secondary | ICD-10-CM

## 2020-01-23 DIAGNOSIS — Z09 Encounter for follow-up examination after completed treatment for conditions other than malignant neoplasm: Secondary | ICD-10-CM | POA: Diagnosis not present

## 2020-01-23 DIAGNOSIS — F1190 Opioid use, unspecified, uncomplicated: Secondary | ICD-10-CM | POA: Diagnosis not present

## 2020-01-23 DIAGNOSIS — F411 Generalized anxiety disorder: Secondary | ICD-10-CM

## 2020-01-23 DIAGNOSIS — M79605 Pain in left leg: Secondary | ICD-10-CM

## 2020-01-23 MED ORDER — ALPRAZOLAM 0.5 MG PO TABS: 0.5000 mg | ORAL_TABLET | Freq: Every evening | ORAL | 0 refills | Status: DC | PRN

## 2020-01-23 NOTE — Progress Notes (Signed)
Integrated Behavioral Health Behavioral Health Screening    01/23/2020 Name: Austin [name] MRN: [id number] DOB: 1991-05-20 Austin [name] is a 29 y.o. year old male who sees Kallie Locks, FNP for primary care. LCSW was consulted to assess mental health needs and assist the patient with Mental Health Counseling and Resources. Assessed thoughts of SI, plan and access to means.  Interpreter: No.   Interpreter Name & Language: none  SUBJECTIVE: Presenting issue / symptoms/concerns: anxiety, depression Duration of symptoms/ how impacting : several months Recent life changes: stress at work Family / Social support: wife  Mood: Anxious Affect: Tearful  OBJECTIVE:  Psychiatric History - Diagnoses: no existing psychiatric diagnoses, CSW to complete full assessment - Hospitalizations/ prior attempts:  none - Pharmacotherapy: PCP starting Xanax today - Outpatient therapy: some previous grief therapy Family history of psychiatric issues:  unknown Current and history of substance use:  unknown  PHQ-2 score of 4.  Depression screen [facility] 2/9 01/23/2020 11/23/2019 10/17/2019  Decreased Interest 2 0 0  Down, Depressed, Hopeless - 0 0  PHQ - 2 Score 2 0 0     No flowsheet data found.  Outpatient Encounter Medications as of 01/23/2020  Medication Sig Note  . ondansetron (ZOFRAN) 4 MG tablet Take 1 tablet (4 mg total) by mouth every 8 (eight) hours as needed for nausea or vomiting. 11/23/2019: As needed.   . Oxycodone HCl 10 MG TABS Take 1 tablet (10 mg total) by mouth every 6 (six) hours as needed for up to 15 days (pain).   . promethazine (PHENERGAN) 25 MG tablet Take 1 tablet (25 mg total) by mouth every 6 (six) hours as needed for nausea or vomiting.   Marland Kitchen tetrahydrozoline 0.05 % ophthalmic solution Place 1 drop into both eyes 2 (two) times daily as needed (Red eyes). 11/23/2019: As needed.   . ALPRAZolam (XANAX) 0.5 MG tablet Take 1 tablet (0.5 mg total) by mouth at bedtime as needed.    Marland Kitchen ibuprofen (ADVIL,MOTRIN) 800 MG tablet Take 1 tablet (800 mg total) by mouth every 8 (eight) hours as needed. (Patient not taking: Reported on 01/23/2020) 11/23/2019: As needed.   . loratadine (CLARITIN) 10 MG tablet Take 1 tablet (10 mg total) by mouth daily. (Patient not taking: Reported on 01/23/2020)    No facility-administered encounter medications on file as of 01/23/2020.    Review of patient status, including review of consultants reports, relevant laboratory and other test results, and collaboration with appropriate care team members and the patient's provider was performed as part of comprehensive patient evaluation and provision of services.    Assessment: Patient is currently experiencing symptoms of anxiety and depression which are exacerbated by work and family stressors. Patient denied thoughts of suicide. Brief supportive counseling today and mindfulness exercise. Planned for ongoing IBH follow up.  Recommendation: Patient may benefit from, and is in agreement to [facility] treatment (in clinic). CSW and patient to follow up on 01/31/20 for full assessment.   Intervention:Patient interviewed and appropriate assessments performed: brief mental health assessment . Patient interviewed and appropriate assessments performed . Mindfulness or Relaxation Training   SDOH (Social Determinants of Health) assessments performed: No   Goals Addressed   None     Follow Up Plan:  1. Integrated Behavioral Health treatment in clinic. Next appointment 01/31/20.   Abigail Butts, LCSW [facility] [phone]

## 2020-01-23 NOTE — Progress Notes (Signed)
[facility]  Hospital Follow Up  Subjective:  Patient ID: Austin [name], male    DOB: 31-Aug-1990  Age: 29 y.o. MRN: [id number]  CC:  Chief Complaint  Patient presents with  . Depression    Stressed with Sickle Cell; feels too much pressure on him.     HPI Austin [name] is a 29 year old presents for Hospital Follow Up today.     Patient Active Problem List   Diagnosis Date Noted  . Bilateral lower extremity pain 10/17/2019  . Sickle cell anemia without crisis (HCC) 09/20/2019  . Chronic pain syndrome 09/20/2019  . Nausea 09/20/2019  . Chronic, continuous use of opioids 09/20/2019  . Medication management 03/20/2019  . Bilious vomiting without nausea   . Viral gastroenteritis 01/23/2019  . Nausea vomiting and diarrhea   . Generalized abdominal pain   . Hb-SS disease with crisis (HCC)   . Dehydration   . Sickle cell pain crisis (HCC) 05/18/2018  . Acute sickle cell crisis (HCC) 12/15/2017  . Thrombocytopenia (HCC) 12/15/2017  . Splenomegaly 12/15/2017  . Sickle cell anemia with crisis (HCC) 12/15/2017  . 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 Spleen enlarged 08/18/2017    Past Medical History:  Diagnosis Date  . Acute maxillary sinusitis   . Eczema   . Sickle cell anemia (HCC) .    Current Status: Since his last office visit, his anxiety is high today, r/t personal and work stressors. He denies suicidal ideations, homicidal ideations, or auditory hallucinations.He states that he has pain in his arms and legs. He rates his pain today at 5/10. He has not had a hospital visit for Sickle Cell Crisis since 01/17/2020 where he was treated and discharged the same day. He is currently taking all medications as prescribed and staying well hydrated. He reports occasional nausea, constipation, dizziness and headaches. He denies fevers, chills, fatigue, recent infections, weight loss, and night sweats. He has not had  any visual changes, and falls. No chest pain, heart palpitations, cough and shortness of breath reported. Denies GI problems such as vomiting, and diarrhea. He has no reports of blood in stools, dysuria and hematuria. He is taking all medications as prescribed.   Past Surgical History:  Procedure Laterality Date  . WISDOM TOOTH EXTRACTION      Family History  Problem Relation Age of Onset  . Sickle cell trait Mother   . Diabetes Father   . Hypertension Father     Social History   Socioeconomic History  . Marital status: Married    Spouse name: Not on file  . Number of children: Not on file  . Years of education: Not on file  . Highest education level: Not on file  Occupational History  . Not on file  Tobacco Use  . Smoking status: Former Smoker    Packs/day: 0.10  . Smokeless tobacco: Never Used  Vaping Use  . Vaping Use: Never used  Substance and Sexual Activity  . Alcohol use: Yes    Comment: occ  . Drug use: Yes    Types: Marijuana  . Sexual activity: Yes  Other Topics Concern  . Not on file  Social History Narrative  . Not on file   Social Determinants of Health   Financial Resource Strain:   . Difficulty of Paying Living Expenses:   Food Insecurity:   . Worried About Programme researcher, broadcasting/film/video in the Last Year:   .  Ran Out of Food in the Last Year:   Transportation Needs:   . Freight forwarder (Medical):   Marland Kitchen Lack of Transportation (Non-Medical):   Physical Activity:   . Days of Exercise per Week:   . Minutes of Exercise per Session:   Stress:   . Feeling of Stress :   Social Connections:   . Frequency of Communication with Friends and Family:   . Frequency of Social Gatherings with Friends and Family:   . Attends Religious Services:   . Active Member of Clubs or Organizations:   . Attends Banker Meetings:   Marland Kitchen Marital Status:   Intimate Partner Violence:   . Fear of Current or Ex-Partner:   . Emotionally Abused:   Marland Kitchen Physically Abused:    . Sexually Abused:     Outpatient Medications Prior to Visit  Medication Sig Dispense Refill  . ondansetron (ZOFRAN) 4 MG tablet Take 1 tablet (4 mg total) by mouth every 8 (eight) hours as needed for nausea or vomiting. 20 tablet 3  . tetrahydrozoline 0.05 % ophthalmic solution Place 1 drop into both eyes 2 (two) times daily as needed (Red eyes).    . Oxycodone HCl 10 MG TABS Take 1 tablet (10 mg total) by mouth every 6 (six) hours as needed for up to 15 days (pain). 60 tablet 0  . promethazine (PHENERGAN) 25 MG tablet Take 1 tablet (25 mg total) by mouth every 6 (six) hours as needed for nausea or vomiting. 30 tablet 0  . ibuprofen (ADVIL,MOTRIN) 800 MG tablet Take 1 tablet (800 mg total) by mouth every 8 (eight) hours as needed. (Patient not taking: Reported on 01/23/2020) 30 tablet 1  . loratadine (CLARITIN) 10 MG tablet Take 1 tablet (10 mg total) by mouth daily. (Patient not taking: Reported on 01/23/2020) 30 tablet 6   No facility-administered medications prior to visit.    No Known Allergies  ROS Review of Systems  Constitutional: Negative.   HENT: Negative.   Eyes: Negative.   Respiratory: Negative.   Cardiovascular: Negative.   Gastrointestinal: Positive for constipation (occasional) and nausea (occasional).  Endocrine: Negative.   Genitourinary: Negative.   Musculoskeletal: Positive for arthralgias (generalized joint pain).  Skin: Negative.   Allergic/Immunologic: Negative.   Neurological: Positive for dizziness (occasional ) and headaches (occasional ).  Hematological: Negative.   Psychiatric/Behavioral: Positive for agitation. The patient is nervous/anxious and is hyperactive.       Objective:    Physical Exam Vitals and nursing note reviewed.  Constitutional:      Appearance: Normal appearance.  HENT:     Head: Normocephalic and atraumatic.     Nose: Nose normal.     Mouth/Throat:     Mouth: Mucous membranes are dry.  Cardiovascular:     Rate and Rhythm:  Normal rate and regular rhythm.     Pulses: Normal pulses.     Heart sounds: Normal heart sounds.  Pulmonary:     Effort: Pulmonary effort is normal.     Breath sounds: Normal breath sounds.  Abdominal:     General: Abdomen is flat. Bowel sounds are normal.     Palpations: Abdomen is soft.  Musculoskeletal:        General: Normal range of motion.     Cervical back: Normal range of motion and neck supple.  Skin:    General: Skin is warm and dry.  Neurological:     General: No focal deficit present.     Mental  Status: He is alert and oriented to person, place, and time.  Psychiatric:        Mood and Affect: Mood normal.        Behavior: Behavior normal.        Thought Content: Thought content normal.        Judgment: Judgment normal.     BP (!) 144/88   Temp (!) 85 F (29.4 C)   SpO2 100%  Wt Readings from Last 3 Encounters:  01/17/20 185 lb (83.9 kg)  11/23/19 180 lb (81.6 kg)  10/17/19 183 lb (83 kg)     Health Maintenance Due  Topic Date Due  . Hepatitis C Screening  Never done  . COVID-19 Vaccine (1) Never done  . TETANUS/TDAP  Never done    There are no preventive care reminders to display for this patient.  Lab Results  Component Value Date   TSH 0.662 08/12/2017   Lab Results  Component Value Date   WBC 9.1 01/17/2020   HGB 13.4 01/17/2020   HCT 38.0 (L) 01/17/2020   MCV 80.3 01/17/2020   PLT 144 (L) 01/17/2020   Lab Results  Component Value Date   NA 135 01/17/2020   K 4.5 01/17/2020   CO2 22 01/17/2020   GLUCOSE 103 (H) 01/17/2020   BUN 11 01/17/2020   CREATININE 0.88 01/17/2020   BILITOT 1.4 (H) 01/17/2020   ALKPHOS 52 01/17/2020   AST 16 01/17/2020   ALT 28 01/17/2020   PROT 8.3 (H) 01/17/2020   ALBUMIN 4.6 01/17/2020   CALCIUM 9.4 01/17/2020   ANIONGAP 9 01/17/2020   No results found for: CHOL No results found for: HDL No results found for: LDLCALC No results found for: TRIG No results found for: [facility] Lab Results  Component  Value Date   HGBA1C 4.2 08/12/2017    Assessment & Plan:   1. Anxiety state High anxiety today. Patient had visit with our social worker.  - ALPRAZolam Prudy Feeler) 0.5 MG tablet; Take 1 tablet (0.5 mg total) by mouth at bedtime as needed.  Dispense: 30 tablet; Refill: 0  2. Sickle cell anemia without crisis [facility]) He is doing well today r/t his chronic pain management. He will continue to take pain medications as prescribed; will continue to avoid extreme heat and cold; will continue to eat a healthy diet and drink at least 64 ounces of water daily; continue stool softener as needed; will avoid colds and flu; will continue to get plenty of sleep and rest; will continue to avoid high stressful situations and remain infection free; will continue Folic Acid 1 mg daily to avoid sickle cell crisis. Continue to follow up with Hematologist as needed.  - Urinalysis Dipstick - Oxycodone HCl 10 MG TABS; Take 1 tablet (10 mg total) by mouth every 6 (six) hours as needed for up to 15 days (pain).  Dispense: 60 tablet; Refill: 0  3. Chronic pain syndrome - Oxycodone HCl 10 MG TABS; Take 1 tablet (10 mg total) by mouth every 6 (six) hours as needed for up to 15 days (pain).  Dispense: 60 tablet; Refill: 0  4. Chronic, continuous use of opioids - Oxycodone HCl 10 MG TABS; Take 1 tablet (10 mg total) by mouth every 6 (six) hours as needed for up to 15 days (pain).  Dispense: 60 tablet; Refill: 0  5. Bilateral lower extremity pain Stable today.   6. Nausea and vomiting, intractability of vomiting not specified, unspecified vomiting type Stable.  - promethazine (PHENERGAN)  25 MG tablet; Take 1 tablet (25 mg total) by mouth every 6 (six) hours as needed for nausea or vomiting.  Dispense: 30 tablet; Refill: 0  7. Follow up He will follow up in 2 months.   Meds ordered this encounter  Medications  . ALPRAZolam (XANAX) 0.5 MG tablet    Sig: Take 1 tablet (0.5 mg total) by mouth at bedtime as needed.     Dispense:  30 tablet    Refill:  0  . promethazine (PHENERGAN) 25 MG tablet    Sig: Take 1 tablet (25 mg total) by mouth every 6 (six) hours as needed for nausea or vomiting.    Dispense:  30 tablet    Refill:  0  . Oxycodone HCl 10 MG TABS    Sig: Take 1 tablet (10 mg total) by mouth every 6 (six) hours as needed for up to 15 days (pain).    Dispense:  60 tablet    Refill:  0    Order Specific Question:   Supervising Provider    Answer:   Quentin AngstJEGEDE, OLUGBEMIGA E [id number]]    Orders Placed This Encounter  Procedures  . Urinalysis Dipstick    Referral Orders  No referral(s) requested today    Raliegh IpNatalie Melondy Blanchard,  MSN, FNP-BC [place] [facility] [address]  [place], [place] [address] [phone]   Problem List Items Addressed This Visit      Other   Bilateral lower extremity pain   Chronic pain syndrome   Relevant Medications   Oxycodone HCl 10 MG TABS   Chronic, continuous use of opioids   Relevant Medications   Oxycodone HCl 10 MG TABS   Sickle cell anemia without crisis (HCC)   Relevant Medications   Oxycodone HCl 10 MG TABS   Other Relevant Orders   Urinalysis Dipstick    Other Visit Diagnoses    Anxiety state    -  Primary   Relevant Medications   ALPRAZolam (XANAX) 0.5 MG tablet   Nausea and vomiting, intractability of vomiting not specified, unspecified vomiting type       Relevant Medications   promethazine (PHENERGAN) 25 MG tablet   Follow up          Meds ordered this encounter  Medications  . ALPRAZolam (XANAX) 0.5 MG tablet    Sig: Take 1 tablet (0.5 mg total) by mouth at bedtime as needed.    Dispense:  30 tablet    Refill:  0  . promethazine (PHENERGAN) 25 MG tablet    Sig: Take 1 tablet (25 mg total) by mouth every 6 (six) hours as needed for nausea or vomiting.    Dispense:  30 tablet    Refill:  0  . Oxycodone HCl 10 MG TABS    Sig: Take 1  tablet (10 mg total) by mouth every 6 (six) hours as needed for up to 15 days (pain).    Dispense:  60 tablet    Refill:  0    Order Specific Question:   Supervising Provider    Answer:   Quentin AngstJEGEDE, OLUGBEMIGA E [id number]]    Follow-up: No follow-ups on file.    Kallie LocksNatalie M Christianne Zacher, FNP

## 2020-01-24 MED ORDER — OXYCODONE HCL 10 MG PO TABS: 10.0000 mg | ORAL_TABLET | Freq: Four times a day (QID) | ORAL | 0 refills | Status: DC | PRN

## 2020-01-24 MED ORDER — PROMETHAZINE HCL 25 MG PO TABS: 25.0000 mg | ORAL_TABLET | Freq: Four times a day (QID) | ORAL | 0 refills | Status: DC | PRN

## 2020-01-31 ENCOUNTER — Ambulatory Visit (INDEPENDENT_AMBULATORY_CARE_PROVIDER_SITE_OTHER): Payer: 59 | Admitting: Clinical

## 2020-01-31 ENCOUNTER — Other Ambulatory Visit: Payer: Self-pay

## 2020-01-31 DIAGNOSIS — F411 Generalized anxiety disorder: Secondary | ICD-10-CM | POA: Diagnosis not present

## 2020-01-31 NOTE — BH Specialist Note (Signed)
ADULT Comprehensive Clinical Assessment (CCA) Note   01/31/2020 Austin [name] [id number]   Referring Provider: [name], NP Session Time: 3:25 - 4:20 55  minutes.  SUBJECTIVE: Austin [name] is a 29 y.o.   male accompanied by self  Austin [name] was seen in consultation at the request of Kallie Locks, FNP for evaluation of anxiety.  Types of Service: Individual psychotherapy  Reason for referral in patient/family's own words:  Anxiety and stress    He likes to be called Austin [name].  He came to the appointment with self.  Primary language at home is [place].  Constitutional Appearance: cooperative, well-nourished, well-developed, alert and well-appearing  (Patient to answer as appropriate) Gender identity: male Sex assigned at birth: male Pronouns: he/his   Mental status exam:   General Appearance Austin [name]:  Neat Eye Contact:  Good Motor Behavior:  Normal Speech:  Normal Level of Consciousness:  Alert Mood:  Euthymic Affect:  Appropriate Anxiety Level:  Minimal Thought Process:  Coherent Thought Content:  WNL Perception:  Normal Judgment:  Good Insight:  Present   Current Medications and therapies: He is taking:   Outpatient Encounter Medications as of 01/31/2020  Medication Sig  . ALPRAZolam (XANAX) 0.5 MG tablet Take 1 tablet (0.5 mg total) by mouth at bedtime as needed.  Marland Kitchen ibuprofen (ADVIL,MOTRIN) 800 MG tablet Take 1 tablet (800 mg total) by mouth every 8 (eight) hours as needed. (Patient not taking: Reported on 01/23/2020)  . loratadine (CLARITIN) 10 MG tablet Take 1 tablet (10 mg total) by mouth daily. (Patient not taking: Reported on 01/23/2020)  . ondansetron (ZOFRAN) 4 MG tablet Take 1 tablet (4 mg total) by mouth every 8 (eight) hours as needed for nausea or vomiting.  . Oxycodone HCl 10 MG TABS Take 1 tablet (10 mg total) by mouth every 6 (six) hours as needed for up to 15 days (pain).  . promethazine (PHENERGAN) 25 MG tablet Take 1  tablet (25 mg total) by mouth every 6 (six) hours as needed for nausea or vomiting.  Marland Kitchen tetrahydrozoline 0.05 % ophthalmic solution Place 1 drop into both eyes 2 (two) times daily as needed (Red eyes).   No facility-administered encounter medications on file as of 01/31/2020.     Therapies:  In the past behavioral therapy, agency unknown - grief therapy  Family history: Family mental illness:  No information Family school achievement history:  No information Other relevant family history:  No known history of substance use or alcoholism  Social History: Now living with wife and young son. Employment:  employed Religious or Spiritual Beliefs: none  Negative Mood Concerns Self-injury:  No Suicidal ideation:  No Suicide attempt:  No  Additional Anxiety Concerns: Panic attacks:  No Obsessions:  No Compulsions:  No  Stressors:  Family conflict, Grief/losses and Work environment  Alcohol and/or Substance Use: Have you recently consumed alcohol? no  Have you recently used any drugs?  no  Have you recently consumed any tobacco? no Does patient seem concerned about dependence or abuse of any substance? no  Substance Use Disorder Checklist:  Not applicable  Severity Risk Scoring based on DSM-5 Criteria for Substance Use Disorder. The presence of at least two (2) criteria in the last 12 months indicate a substance use disorder. The severity of the substance use disorder is defined as:  Mild: Presence of 2-3 criteria Moderate: Presence of 4-5 criteria Severe: Presence of 6 or more criteria  Traumatic Experiences: History or current traumatic events (natural disaster,  house fire, etc.)? yes History or current physical trauma?  yes History or current emotional trauma?  yes History or current sexual trauma?  no History or current domestic or intimate partner violence?  no History of bullying:  yes  Risk Assessment: Suicidal or homicidal thoughts?   no Self injurious behaviors?   no Guns in the home?  yes, registered and kept locked in safe  Self Harm Risk Factors: Chronic pain  Self Harm Thoughts?: No  Patient and/or Family's Strengths/Protective Factors: Social and Emotional competence, Concrete supports in place (healthy food, safe environments, etc.) and Physical Health (exercise, healthy diet, medication compliance, etc.)  Patient's and/or Family's Goals in their own words: Reduce anxiety and stress   Interventions: Interventions utilized:  Supportive Counseling  Standardized Assessments completed: GAD-7 and PHQ 2&9   Depression screen [facility] 2/9 01/31/2020 01/23/2020 11/23/2019  Decreased Interest 0 2 0  Down, Depressed, Hopeless 1 - 0  PHQ - 2 Score 1 2 0  Altered sleeping 0 - -  Tired, decreased energy 3 - -  Change in appetite 2 - -  Feeling bad or failure about yourself  3 - -  Trouble concentrating 0 - -  Moving slowly or fidgety/restless 0 - -  Suicidal thoughts 0 - -  PHQ-9 Score 9 - -   GAD 7 : Generalized Anxiety Score 01/31/2020  Nervous, Anxious, on Edge 2  Control/stop worrying 2  Worry too much - different things 2  Trouble relaxing 1  Restless 3  Easily annoyed or irritable 1  Afraid - awful might happen 2  Total GAD 7 Score 13    Patient Centered Plan: Patient is on the following Treatment Plan(s):  Anxiety and Depression  Coordination of Care: coordination with PCP  DSM-5 Diagnosis: Generalized anxiety disorder  Recommendations for Services/Supports/Treatments: Integrated Behavioral Health (IBH) services in clinic to address anxiety and mood concerns.  Progress towards Goals: Ongoing  Treatment Plan Summary: Behavioral Health Clinician will: Assess individual's status and evaluate for psychiatric symptoms, Provide coping skills enhancement and Utilize evidence based practices to address psychiatric symptoms   Individual will: Complete all homework and actively participate during therapy, Report any thoughts or plans of  harming themselves or others and Utilize coping skills taught in therapy to reduce symptoms  Referral(s): Integrated Behavioral Health in clinic  Austin Butts, LCSW [facility] [phone]

## 2020-02-04 ENCOUNTER — Telehealth (HOSPITAL_COMMUNITY): Payer: Self-pay | Admitting: *Deleted

## 2020-02-04 ENCOUNTER — Other Ambulatory Visit: Payer: Self-pay

## 2020-02-04 ENCOUNTER — Encounter: Payer: Self-pay | Admitting: Family Medicine

## 2020-02-04 ENCOUNTER — Ambulatory Visit (INDEPENDENT_AMBULATORY_CARE_PROVIDER_SITE_OTHER): Payer: 59 | Admitting: Family Medicine

## 2020-02-04 VITALS — BP 101/68 | HR 78 | Temp 98.1°F | Resp 16 | Ht 67.0 in | Wt 184.6 lb

## 2020-02-04 DIAGNOSIS — F1190 Opioid use, unspecified, uncomplicated: Secondary | ICD-10-CM | POA: Diagnosis not present

## 2020-02-04 DIAGNOSIS — D571 Sickle-cell disease without crisis: Secondary | ICD-10-CM | POA: Diagnosis not present

## 2020-02-04 DIAGNOSIS — G894 Chronic pain syndrome: Secondary | ICD-10-CM

## 2020-02-04 DIAGNOSIS — F419 Anxiety disorder, unspecified: Secondary | ICD-10-CM | POA: Diagnosis not present

## 2020-02-04 DIAGNOSIS — Z09 Encounter for follow-up examination after completed treatment for conditions other than malignant neoplasm: Secondary | ICD-10-CM

## 2020-02-04 NOTE — Telephone Encounter (Signed)
Patient called requesting to come to the day hospital for sickle cell pain. Patient reports lower back pain rated 10/10. Report taking Oxycodone last night. COVID-19 screening done and patient denies all symptoms and exposures. Patient admits to some vomiting but denies fever, chest pain, diarrhea, abdominal pain and priapism. [place], FNP notified. Day hospital is currently at capacity. Patient advised to continue to take prescribed medications around the clock. Patient can call back in the morning if pain persists. Advised to go to the ED if pain intensifies. Patient expresses an understanding.

## 2020-02-04 NOTE — Progress Notes (Signed)
[facility]    Established Patient Office Visit  Subjective:  Patient ID: Austin MikeKeith D Mecca, male    DOB: [birth year]  Age: 29 y.o. MRN: [id number]  CC:  Chief Complaint  Patient presents with  . Follow-up    Pt states he is here for his f/u. PT states he woke up feeling sick. Pt states he vomitted 3 times today. Se took his medicine.     HPI Austin [name] is a [age] year old male who presents for Follow Up today.     Patient Active Problem List   Diagnosis Date Noted  . Bilateral lower extremity pain 10/17/2019  . Sickle cell anemia without crisis (HCC) 09/20/2019  . Chronic pain syndrome 09/20/2019  . Nausea 09/20/2019  . Chronic, continuous use of opioids 09/20/2019  . Medication management 03/20/2019  . Bilious vomiting without nausea   . Viral gastroenteritis 01/23/2019  . Nausea vomiting and diarrhea   . Generalized abdominal pain   . Hb-SS disease with crisis (HCC)   . Dehydration   . Sickle cell pain crisis (HCC) 05/18/2018  . Acute sickle cell crisis (HCC) 12/15/2017  . Thrombocytopenia (HCC) 12/15/2017  . Splenomegaly 12/15/2017  . Sickle cell anemia with crisis (HCC) 12/15/2017  . 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 Spleen enlarged 08/18/2017    Past Medical History:  Diagnosis Date  . Acute maxillary sinusitis   . Eczema   . Sickle cell anemia (HCC) .   Current Status: Since his last office visit, he is doing well with no complaints. He states that he has pain in his back. He rates his pain today at 7/10. He has not had a hospital visit for Sickle Cell Crisis since 01/17/2020 where her was treated and discharged the same day. He is currently taking all medications as prescribed and staying well hydrated. He reports occasional nausea, constipation, dizziness and headaches. He has not had any visual changes, and falls. No chest pain, heart palpitations, cough and shortness of breath reported. Denies  GI problems such as vomiting, and diarrhea. He has no reports of blood in stools, dysuria and hematuria. No depression or anxiety, and denies suicidal ideations, homicidal ideations, or auditory hallucinations. He is taking all medications as prescribed.   Past Surgical History:  Procedure Laterality Date  . WISDOM TOOTH EXTRACTION      Family History  Problem Relation Age of Onset  . Sickle cell trait Mother   . Diabetes Father   . Hypertension Father     Social History   Socioeconomic History  . Marital status: Married    Spouse name: Not on file  . Number of children: Not on file  . Years of education: Not on file  . Highest education level: Not on file  Occupational History  . Not on file  Tobacco Use  . Smoking status: Former Smoker    Packs/day: 0.10  . Smokeless tobacco: Never Used  Vaping Use  . Vaping Use: Never used  Substance and Sexual Activity  . Alcohol use: Yes    Comment: occ  . Drug use: Yes    Types: Marijuana  . Sexual activity: Yes  Other Topics Concern  . Not on file  Social History Narrative  . Not on file   Social Determinants of Health   Financial Resource Strain:   . Difficulty of Paying Living Expenses:   Food Insecurity:   . Worried About Programme researcher, broadcasting/film/videounning Out of Food in  the Last Year:   . Ran Out of Food in the Last Year:   Transportation Needs:   . Freight forwarder (Medical):   Marland Kitchen Lack of Transportation (Non-Medical):   Physical Activity:   . Days of Exercise per Week:   . Minutes of Exercise per Session:   Stress:   . Feeling of Stress :   Social Connections:   . Frequency of Communication with Friends and Family:   . Frequency of Social Gatherings with Friends and Family:   . Attends Religious Services:   . Active Member of Clubs or Organizations:   . Attends Banker Meetings:   Marland Kitchen Marital Status:   Intimate Partner Violence:   . Fear of Current or Ex-Partner:   . Emotionally Abused:   Marland Kitchen Physically Abused:   .  Sexually Abused:     Outpatient Medications Prior to Visit  Medication Sig Dispense Refill  . ALPRAZolam (XANAX) 0.5 MG tablet Take 1 tablet (0.5 mg total) by mouth at bedtime as needed. 30 tablet 0  . loratadine (CLARITIN) 10 MG tablet Take 1 tablet (10 mg total) by mouth daily. 30 tablet 6  . Oxycodone HCl 10 MG TABS Take 1 tablet (10 mg total) by mouth every 6 (six) hours as needed for up to 15 days (pain). 60 tablet 0  . promethazine (PHENERGAN) 25 MG tablet Take 1 tablet (25 mg total) by mouth every 6 (six) hours as needed for nausea or vomiting. 30 tablet 0  . tetrahydrozoline 0.05 % ophthalmic solution Place 1 drop into both eyes 2 (two) times daily as needed (Red eyes).    . ondansetron (ZOFRAN) 4 MG tablet Take 1 tablet (4 mg total) by mouth every 8 (eight) hours as needed for nausea or vomiting. (Patient not taking: Reported on 02/04/2020) 20 tablet 3  . ibuprofen (ADVIL,MOTRIN) 800 MG tablet Take 1 tablet (800 mg total) by mouth every 8 (eight) hours as needed. (Patient not taking: Reported on 02/04/2020) 30 tablet 1   No facility-administered medications prior to visit.    No Known Allergies  ROS Review of Systems  Constitutional: Negative.   HENT: Negative.   Eyes: Negative.   Respiratory: Negative.   Cardiovascular: Negative.   Gastrointestinal: Positive for constipation (occasional ) and nausea (occasional ).  Endocrine: Negative.   Genitourinary: Negative.   Musculoskeletal: Positive for arthralgias (generalized joint pain.).  Skin: Negative.   Allergic/Immunologic: Negative.   Neurological: Positive for dizziness (occasional ) and headaches (occasional ).  Hematological: Negative.   Psychiatric/Behavioral: Negative.     Objective:    Physical Exam Vitals and nursing note reviewed.  Constitutional:      Appearance: Normal appearance.  HENT:     Head: Normocephalic and atraumatic.     Nose: Nose normal.     Mouth/Throat:     Mouth: Mucous membranes are  moist.     Pharynx: Oropharynx is clear.  Cardiovascular:     Rate and Rhythm: Normal rate and regular rhythm.     Pulses: Normal pulses.     Heart sounds: Normal heart sounds.  Pulmonary:     Effort: Pulmonary effort is normal.     Breath sounds: Normal breath sounds.  Abdominal:     General: Abdomen is flat. Bowel sounds are normal.     Palpations: Abdomen is soft.  Musculoskeletal:        General: Normal range of motion.     Cervical back: Normal range of motion and neck supple.  Skin:    General: Skin is warm and dry.  Neurological:     General: No focal deficit present.     Mental Status: He is alert and oriented to person, place, and time.  Psychiatric:        Mood and Affect: Mood normal.        Behavior: Behavior normal.        Thought Content: Thought content normal.        Judgment: Judgment normal.    BP 101/68 (BP Location: Left Arm, Patient Position: Sitting, Cuff Size: Normal)   Pulse 78   Temp 98.1 F (36.7 C)   Resp 16   Ht 5\' 7"  (1.702 m)   Wt 184 lb 9.6 oz (83.7 kg)   SpO2 100%   BMI 28.91 kg/m  Wt Readings from Last 3 Encounters:  02/04/20 184 lb 9.6 oz (83.7 kg)  01/17/20 185 lb (83.9 kg)  11/23/19 180 lb (81.6 kg)    Health Maintenance Due  Topic Date Due  . Hepatitis C Screening  Never done  . COVID-19 Vaccine (1) Never done  . TETANUS/TDAP  Never done    There are no preventive care reminders to display for this patient.  Lab Results  Component Value Date   TSH 0.662 08/12/2017   Lab Results  Component Value Date   WBC 9.1 01/17/2020   HGB 13.4 01/17/2020   HCT 38.0 (L) 01/17/2020   MCV 80.3 01/17/2020   PLT 144 (L) 01/17/2020   Lab Results  Component Value Date   NA 135 01/17/2020   K 4.5 01/17/2020   CO2 22 01/17/2020   GLUCOSE 103 (H) 01/17/2020   BUN 11 01/17/2020   CREATININE 0.88 01/17/2020   BILITOT 1.4 (H) 01/17/2020   ALKPHOS 52 01/17/2020   AST 16 01/17/2020   ALT 28 01/17/2020   PROT 8.3 (H) 01/17/2020    ALBUMIN 4.6 01/17/2020   CALCIUM 9.4 01/17/2020   ANIONGAP 9 01/17/2020   No results found for: CHOL No results found for: HDL No results found for: LDLCALC No results found for: TRIG No results found for: [facility] Lab Results  Component Value Date   HGBA1C 4.2 08/12/2017    Assessment & Plan:   1. Sickle cell anemia without crisis [facility]) He is doing well today r/t his chronic pain management. He will continue to take pain medications as prescribed; will continue to avoid extreme heat and cold; will continue to eat a healthy diet and drink at least 64 ounces of water daily; continue stool softener as needed; will avoid colds and flu; will continue to get plenty of sleep and rest; will continue to avoid high stressful situations and remain infection free; will continue Folic Acid 1 mg daily to avoid sickle cell crisis. Continue to follow up with Hematologist as needed.  - Urinalysis Dipstick  2. Chronic, continuous use of opioids  3. Chronic pain syndrome  4. Anxiety Stable today. He will continue to meet with [facility], LCSW in our office as needed.   5. Follow up He will follow up in 2 months.   No orders of the defined types were placed in this encounter.   Orders Placed This Encounter  Procedures  . Urinalysis Dipstick    Referral Orders  No referral(s) requested today    Abigail Butts,  MSN, FNP-BC [facility] [address] [place], [place] [phone] fax  Problem List Items Addressed This Visit      Other   Chronic pain syndrome   Chronic, continuous use of opioids   Sickle cell anemia without crisis (HCC) - Primary   Relevant Orders   Urinalysis Dipstick    Other Visit Diagnoses    Anxiety       Follow up          No orders of the defined types were placed in this encounter.   Follow-up: Return in about 2 months (around  04/06/2020).    Kallie Locks, FNP

## 2020-02-05 ENCOUNTER — Non-Acute Institutional Stay (HOSPITAL_COMMUNITY)
Admission: AD | Admit: 2020-02-05 | Discharge: 2020-02-05 | Disposition: A | Discharge status: Home / Self Care | Payer: 59 | Source: Ambulatory Visit | Attending: Internal Medicine | Admitting: Internal Medicine

## 2020-02-05 ENCOUNTER — Telehealth (HOSPITAL_COMMUNITY): Payer: Self-pay | Admitting: *Deleted

## 2020-02-05 DIAGNOSIS — G894 Chronic pain syndrome: Secondary | ICD-10-CM | POA: Insufficient documentation

## 2020-02-05 DIAGNOSIS — Z87891 Personal history of nicotine dependence: Secondary | ICD-10-CM | POA: Insufficient documentation

## 2020-02-05 DIAGNOSIS — D638 Anemia in other chronic diseases classified elsewhere: Secondary | ICD-10-CM | POA: Diagnosis not present

## 2020-02-05 DIAGNOSIS — F1120 Opioid dependence, uncomplicated: Secondary | ICD-10-CM | POA: Diagnosis not present

## 2020-02-05 DIAGNOSIS — Z832 Family history of diseases of the blood and blood-forming organs and certain disorders involving the immune mechanism: Secondary | ICD-10-CM | POA: Diagnosis not present

## 2020-02-05 DIAGNOSIS — D5700 Hb-SS disease with crisis, unspecified: Secondary | ICD-10-CM | POA: Diagnosis not present

## 2020-02-05 LAB — COMPREHENSIVE METABOLIC PANEL WITH GFR
ALT: 41 U/L (ref 0–44)
AST: 18 U/L (ref 15–41)
Albumin: 4.5 g/dL (ref 3.5–5.0)
Alkaline Phosphatase: 52 U/L (ref 38–126)
Anion gap: 9 (ref 5–15)
BUN: 8 mg/dL (ref 6–20)
CO2: 24 mmol/L (ref 22–32)
Calcium: 9.1 mg/dL (ref 8.9–10.3)
Chloride: 103 mmol/L (ref 98–111)
Creatinine, Ser: 0.83 mg/dL (ref 0.61–1.24)
GFR calc Af Amer: >60 mL/min
GFR calc non Af Amer: >60 mL/min
Glucose, Bld: 170 mg/dL — ABNORMAL HIGH (ref 70–99)
Potassium: 3.7 mmol/L (ref 3.5–5.1)
Sodium: 136 mmol/L (ref 135–145)
Total Bilirubin: 1.3 mg/dL — ABNORMAL HIGH (ref 0.3–1.2)
Total Protein: 8.1 g/dL (ref 6.5–8.1)

## 2020-02-05 LAB — CBC WITH DIFFERENTIAL/PLATELET
Abs Immature Granulocytes: 0.06 K/uL (ref 0.00–0.07)
Basophils Absolute: 0 K/uL (ref 0.0–0.1)
Basophils Relative: 0 %
Eosinophils Absolute: 0.1 K/uL (ref 0.0–0.5)
Eosinophils Relative: 1 %
HCT: 38.9 % — ABNORMAL LOW (ref 39.0–52.0)
Hemoglobin: 13.6 g/dL (ref 13.0–17.0)
Immature Granulocytes: 1 %
Lymphocytes Relative: 28 %
Lymphs Abs: 2.6 K/uL (ref 0.7–4.0)
MCH: 28.3 pg (ref 26.0–34.0)
MCHC: 35 g/dL (ref 30.0–36.0)
MCV: 80.9 fL (ref 80.0–100.0)
Monocytes Absolute: 0.6 K/uL (ref 0.1–1.0)
Monocytes Relative: 6 %
Neutro Abs: 5.9 K/uL (ref 1.7–7.7)
Neutrophils Relative %: 64 %
Platelets: 147 K/uL — ABNORMAL LOW (ref 150–400)
RBC: 4.81 MIL/uL (ref 4.22–5.81)
RDW: 14.7 % (ref 11.5–15.5)
WBC: 9.2 K/uL (ref 4.0–10.5)
nRBC: 0.9 % — ABNORMAL HIGH (ref 0.0–0.2)

## 2020-02-05 LAB — POCT URINALYSIS DIPSTICK
Bilirubin, UA: NEGATIVE
Blood, UA: NEGATIVE
Glucose, UA: NEGATIVE
Ketones, UA: NEGATIVE
Leukocytes, UA: NEGATIVE
Nitrite, UA: NEGATIVE
Protein, UA: NEGATIVE
Spec Grav, UA: 1.025 (ref 1.010–1.025)
Urobilinogen, UA: 2 E.U./dL — AB
pH, UA: 7 (ref 5.0–8.0)

## 2020-02-05 LAB — RETICULOCYTES
Immature Retic Fract: 35.9 % — ABNORMAL HIGH (ref 2.3–15.9)
RBC.: 4.81 MIL/uL (ref 4.22–5.81)
Retic Count, Absolute: 259.3 10*3/uL — ABNORMAL HIGH (ref 19.0–186.0)
Retic Ct Pct: 5.4 % — ABNORMAL HIGH (ref 0.4–3.1)

## 2020-02-05 MED ORDER — ONDANSETRON HCL 4 MG/2ML IJ SOLN
4.0000 mg | Freq: Four times a day (QID) | INTRAMUSCULAR | Status: DC | PRN
  Administered 2020-02-05: 4 mg via INTRAVENOUS
  Filled 2020-02-05: qty 2

## 2020-02-05 MED ORDER — NALOXONE HCL 0.4 MG/ML IJ SOLN: 0.4000 mg | INTRAMUSCULAR | Status: DC | PRN

## 2020-02-05 MED ORDER — SODIUM CHLORIDE 0.9% FLUSH: 9.0000 mL | INTRAVENOUS | Status: DC | PRN

## 2020-02-05 MED ORDER — ACETAMINOPHEN 500 MG PO TABS
1000.0000 mg | ORAL_TABLET | Freq: Once | ORAL | Status: AC
  Administered 2020-02-05: 1000 mg via ORAL
  Filled 2020-02-05: qty 2

## 2020-02-05 MED ORDER — SODIUM CHLORIDE 0.9 % IV SOLN
25.0000 mg | INTRAVENOUS | Status: DC | PRN
  Filled 2020-02-05: qty 0.5

## 2020-02-05 MED ORDER — DIPHENHYDRAMINE HCL 25 MG PO CAPS: 25.0000 mg | ORAL_CAPSULE | ORAL | Status: DC | PRN

## 2020-02-05 MED ORDER — SODIUM CHLORIDE 0.45 % IV SOLN: INTRAVENOUS | Status: DC

## 2020-02-05 MED ORDER — KETOROLAC TROMETHAMINE 30 MG/ML IJ SOLN
15.0000 mg | Freq: Once | INTRAMUSCULAR | Status: AC
  Administered 2020-02-05: 15 mg via INTRAVENOUS
  Filled 2020-02-05: qty 1

## 2020-02-05 MED ORDER — HYDROMORPHONE 1 MG/ML IV SOLN
INTRAVENOUS | Status: DC
  Administered 2020-02-05: 30 mg via INTRAVENOUS
  Administered 2020-02-05: 3.5 mg via INTRAVENOUS
  Filled 2020-02-05: qty 30

## 2020-02-05 NOTE — Progress Notes (Signed)
Patient admitted to the day infusion hospital for sickle cell pain crisis. Initially, patient reported back pain rated 7/10. For pain management, patient placed on Dilaudid PCA, given 15 mg Toradol, 1000 mg Tylenol and hydrated with IV fluids. At discharge, patient rated pain at 2/10. Vital signs stable. Discharge instructions given. Patient alert, oriented and ambulatory at discharge.

## 2020-02-05 NOTE — H&P (Signed)
[facility] History and Physical   Date: 02/05/2020  Patient name: Austin [name] Medical record number: [id number] Date of birth: 1991-04-08 Age: 29 y.o. Gender: male PCP: Kallie Locks, FNP  Attending physician: No att. providers found  Chief Complaint: Sickle cell pain  History of Present Illness: Austin [name] is a 29 year old male with a medical history significant for sickle cell disease, chronic pain syndrome, opiate dependence and tolerance, and history of anemia of chronic disease presents complaining of generalized pain that is consistent with his typical pain crisis.  Patient was in usual state of health until 2 days prior.  He attributes pain crisis to working increased hours and increased stressors at home.  Patient was treated and evaluated in the emergency department 2 days prior without complete resolution. He last had oxycodone this am without sustained relief.  Pain intensity is 9/10 characterized as constant and aching.  Patient denies headache, chest pain, fever, chills, urinary symptoms, nausea, vomiting, or diarrhea.  No sick contacts, recent travel, or exposure to COVID-19.  Meds: No medications prior to admission.    Allergies: Patient has no known allergies. Past Medical History:  Diagnosis Date  . Acute maxillary sinusitis   . Eczema   . Sickle cell anemia (HCC) .   Past Surgical History:  Procedure Laterality Date  . WISDOM TOOTH EXTRACTION     Family History  Problem Relation Age of Onset  . Sickle cell trait Mother   . Diabetes Father   . Hypertension Father    Social History   Socioeconomic History  . Marital status: Married    Spouse name: Not on file  . Number of children: Not on file  . Years of education: Not on file  . Highest education level: Not on file  Occupational History  . Not on file  Tobacco Use  . Smoking status: Former Smoker    Packs/day: 0.10  . Smokeless tobacco: Never Used  Vaping Use  .  Vaping Use: Never used  Substance and Sexual Activity  . Alcohol use: Yes    Comment: occ  . Drug use: Yes    Types: Marijuana  . Sexual activity: Yes  Other Topics Concern  . Not on file  Social History Narrative  . Not on file   Social Determinants of Health   Financial Resource Strain:   . Difficulty of Paying Living Expenses:   Food Insecurity:   . Worried About Programme researcher, broadcasting/film/video in the Last Year:   . Barista in the Last Year:   Transportation Needs:   . Freight forwarder (Medical):   Marland Kitchen Lack of Transportation (Non-Medical):   Physical Activity:   . Days of Exercise per Week:   . Minutes of Exercise per Session:   Stress:   . Feeling of Stress :   Social Connections:   . Frequency of Communication with Friends and Family:   . Frequency of Social Gatherings with Friends and Family:   . Attends Religious Services:   . Active Member of Clubs or Organizations:   . Attends Banker Meetings:   Marland Kitchen Marital Status:   Intimate Partner Violence:   . Fear of Current or Ex-Partner:   . Emotionally Abused:   Marland Kitchen Physically Abused:   . Sexually Abused:    Review of Systems  Constitutional: Negative for chills and fever.  HENT: Negative.   Eyes: Negative.   Respiratory: Negative.   Cardiovascular: Negative.   Gastrointestinal:  Negative.   Genitourinary: Negative.   Musculoskeletal: Positive for back pain and joint pain.  Skin: Negative.   Neurological: Negative.   Psychiatric/Behavioral: Negative.     Physical Exam: Blood pressure 117/77, pulse 65, temperature (!) 97.5 F (36.4 C), temperature source Oral, resp. rate 16, SpO2 96 %. Physical Exam Constitutional:      Appearance: Normal appearance.  HENT:     Mouth/Throat:     Mouth: Mucous membranes are moist.     Pharynx: Oropharynx is clear.  Cardiovascular:     Rate and Rhythm: Normal rate and regular rhythm.     Pulses: Normal pulses.     Heart sounds: Normal heart sounds.  Pulmonary:      Effort: Pulmonary effort is normal.     Breath sounds: Normal breath sounds.  Abdominal:     General: Abdomen is flat. Bowel sounds are normal.  Skin:    General: Skin is warm.  Neurological:     General: No focal deficit present.     Mental Status: He is alert. Mental status is at baseline.  Psychiatric:        Mood and Affect: Mood normal.        Behavior: Behavior normal.        Thought Content: Thought content normal.        Judgment: Judgment normal.     Lab results: Results for orders placed or performed during the hospital encounter of 02/05/20 (from the past 24 hour(s))  Comprehensive metabolic panel     Status: Abnormal   Collection Time: 02/05/20  9:25 AM  Result Value Ref Range   Sodium 136 135 - 145 mmol/L   Potassium 3.7 3.5 - 5.1 mmol/L   Chloride 103 98 - 111 mmol/L   CO2 24 22 - 32 mmol/L   Glucose, Bld 170 (H) 70 - 99 mg/dL   BUN 8 6 - 20 mg/dL   Creatinine, Ser 0.16 0.61 - 1.24 mg/dL   Calcium 9.1 8.9 - 01.0 mg/dL   Total Protein 8.1 6.5 - 8.1 g/dL   Albumin 4.5 3.5 - 5.0 g/dL   AST 18 15 - 41 U/L   ALT 41 0 - 44 U/L   Alkaline Phosphatase 52 38 - 126 U/L   Total Bilirubin 1.3 (H) 0.3 - 1.2 mg/dL   GFR calc non Af Amer >60 >60 mL/min   GFR calc Af Amer >60 >60 mL/min   Anion gap 9 5 - 15  CBC WITH DIFFERENTIAL     Status: Abnormal   Collection Time: 02/05/20  9:25 AM  Result Value Ref Range   WBC 9.2 4.0 - 10.5 K/uL   RBC 4.81 4.22 - 5.81 MIL/uL   Hemoglobin 13.6 13.0 - 17.0 g/dL   HCT 93.2 (L) 39 - 52 %   MCV 80.9 80.0 - 100.0 fL   MCH 28.3 26.0 - 34.0 pg   MCHC 35.0 30.0 - 36.0 g/dL   RDW 35.5 73.2 - 20.2 %   Platelets 147 (L) 150 - 400 K/uL   nRBC 0.9 (H) 0.0 - 0.2 %   Neutrophils Relative % 64 %   Neutro Abs 5.9 1.7 - 7.7 K/uL   Lymphocytes Relative 28 %   Lymphs Abs 2.6 0.7 - 4.0 K/uL   Monocytes Relative 6 %   Monocytes Absolute 0.6 0 - 1 K/uL   Eosinophils Relative 1 %   Eosinophils Absolute 0.1 0 - 0 K/uL   Basophils Relative  0 %  Basophils Absolute 0.0 0 - 0 K/uL   Immature Granulocytes 1 %   Abs Immature Granulocytes 0.06 0.00 - 0.07 K/uL  Reticulocytes     Status: Abnormal   Collection Time: 02/05/20  9:25 AM  Result Value Ref Range   Retic Ct Pct 5.4 (H) 0.4 - 3.1 %   RBC. 4.81 4.22 - 5.81 MIL/uL   Retic Count, Absolute 259.3 (H) 19.0 - 186.0 K/uL   Immature Retic Fract 35.9 (H) 2.3 - 15.9 %    Imaging results:  No results found.   Assessment & Plan: Patient admitted to sickle cell day infusion center for management of pain crisis.  Patient is opiate tolerant Initiate IV dilaudid PCA. Settings of 0.5 mg, 10-minute lockout, and 2 mg/h IV fluids, 0.45% saline at 150 mL/h Toradol 15 mg IV times one dose Tylenol 1000 mg by mouth times one dose Review CBC with differential, complete metabolic panel, and reticulocytes as results become available. Pain intensity will be reevaluated in context of functioning and relationship to baseline as care progress If pain intensity remains elevated and/or sudden change in hemodynamic stability transition to inpatient services for higher level of care.        Nolon Nations  APRN, MSN, FNP-C Patient Care [facility] [address] [place], [place] [address] [phone]  02/05/2020, 10:05 PM

## 2020-02-05 NOTE — BH Specialist Note (Signed)
[facility] General Follow Up Note  02/05/2020 Name: Austin [name] MRN: [id number] DOB: 09-10-1990 Austin [name] is a 29 y.o. year old male who sees Kallie Locks, FNP for primary care. LCSW was initially consulted to assess mental health needs and assist the patient with Mental Health Counseling and Resources.   Interpreter: No.   Interpreter Name & Language: none  Ongoing Intervention: Patient experiencing anxiety. Today CSW briefly visited patient at bedside in day hospital. Provided community resources for additional needs patient reported today. Following outpatient for anxiety.  Review of patient status, including review of consultants reports, relevant laboratory and other test results, and collaboration with appropriate care team members and the patient's provider was performed as part of comprehensive patient evaluation and provision of services.     Follow up Plan: 1. Continue to follow outpatient   Abigail Butts, LCSW [facility] [phone]

## 2020-02-05 NOTE — Telephone Encounter (Signed)
Patient called requesting to come to the day hospital for sickle cell pain. Patient reports lower back pain rated 7/10. Reports taking Oxycodone at 2:00 am. COVID-19 screening done and patient denies all symptoms and exposures. Denies fever, chest pain, nausea, vomiting, abdominal pain and priapism. Wife will transport patient at discharge. [place], FNP notified. Patient can come to the day hospital for pain management. Patient advised and expresses an understanding.

## 2020-02-05 NOTE — Discharge Summary (Signed)
[facility] Discharge Summary   Patient ID: Austin [name] MRN: [id number] DOB/AGE: March 25, 1991 29 y.o.  Admit date: 02/05/2020 Discharge date: 02/05/2020  Primary Care Physician:  Kallie Locks, FNP  Admission Diagnoses:  Active Problems:   Sickle cell pain crisis [facility])  Discharge Medications:  Allergies as of 02/05/2020   No Known Allergies     Medication List    TAKE these medications   ALPRAZolam 0.5 MG tablet Commonly known as: Xanax Take 1 tablet (0.5 mg total) by mouth at bedtime as needed.   loratadine 10 MG tablet Commonly known as: CLARITIN Take 1 tablet (10 mg total) by mouth daily.   ondansetron 4 MG tablet Commonly known as: Zofran Take 1 tablet (4 mg total) by mouth every 8 (eight) hours as needed for nausea or vomiting.   Oxycodone HCl 10 MG Tabs Take 1 tablet (10 mg total) by mouth every 6 (six) hours as needed for up to 15 days (pain).   promethazine 25 MG tablet Commonly known as: PHENERGAN Take 1 tablet (25 mg total) by mouth every 6 (six) hours as needed for nausea or vomiting.   tetrahydrozoline 0.05 % ophthalmic solution Place 1 drop into both eyes 2 (two) times daily as needed (Red eyes).        Consults:  None  Significant Diagnostic Studies:  No results found.  History of present illness:  Austin [name] is a 29 year old male with a medical history significant for sickle cell disease, chronic pain syndrome, opiate dependence and tolerance, and history of anemia of chronic disease presents complaining of generalized pain that is consistent with his typical pain crisis.  Patient was in usual state of health until 2 days prior.  He attributes pain crisis to working increased hours and increased stressors at home.  Patient was treated and evaluated in the emergency department 2 days prior without complete resolution. He last had oxycodone this am without sustained relief.  Pain intensity is 9/10 characterized as constant  and aching.  Patient denies headache, chest pain, fever, chills, urinary symptoms, nausea, vomiting, or diarrhea.  No sick contacts, recent travel, or exposure to COVID-19. [facility] Course: Patient admitted to sickle cell day infusion center for management of pain crisis.  Reviewed all laboratory values, consistent with patient's baseline. Pain managed with IV Dilaudid PCA with settings of 0.5 mg, 10-minute lockout, and 2 mg/h Toradol 15 mg IV x1 Tylenol 1000 mg by mouth x1 IV fluids, 0.45% saline at 150 mL/h Pain intensity decreased to 2/10.  Patient does not warrant admission on today.  Advised to follow-up with PCP as scheduled.  He is alert, oriented, and ambulating without assistance. Patient will discharge home in a hemodynamically stable condition.  Discharge instructions Resume all home medications.   Follow up with PCP as previously  scheduled.   Discussed the importance of drinking 64 ounces of water daily, dehydration of red blood cells may lead further sickling.   Avoid all stressors that precipitate sickle cell pain crisis.     The patient was given clear instructions to go to ER or return to medical center if symptoms do not improve, worsen or new problems develop.     Physical Exam at Discharge:  BP 117/77 (BP Location: Left Arm)   Pulse 65   Temp (!) 97.5 F (36.4 C) (Oral)   Resp 16   SpO2 96%  Physical Exam Constitutional:      Appearance: Normal appearance.  HENT:  Head: Normocephalic.  Eyes:     Pupils: Pupils are equal, round, and reactive to light.  Cardiovascular:     Rate and Rhythm: Normal rate and regular rhythm.     Pulses: Normal pulses.  Pulmonary:     Effort: Pulmonary effort is normal.  Abdominal:     General: Abdomen is flat. Bowel sounds are normal.  Musculoskeletal:        General: Normal range of motion.     Cervical back: Normal range of motion.  Neurological:     General: No focal deficit present.     Mental  Status: He is alert. Mental status is at baseline.  Psychiatric:        Mood and Affect: Mood normal.        Behavior: Behavior normal.        Thought Content: Thought content normal.        Judgment: Judgment normal.     Disposition at Discharge: Discharge disposition: 01-Home or Self Care       Discharge Orders: Discharge Instructions    Discharge patient   Complete by: As directed    Discharge disposition: 01-Home or Self Care   Discharge patient date: 02/05/2020      Condition at Discharge:   Stable  Time spent on Discharge:  Greater than 30 minutes.  Signed: Nolon Nations  APRN, MSN, FNP-C Patient Care [facility] [address] [place], [place] [address] [phone]  02/05/2020, 9:50 PM

## 2020-02-07 ENCOUNTER — Ambulatory Visit (INDEPENDENT_AMBULATORY_CARE_PROVIDER_SITE_OTHER): Payer: 59 | Admitting: Clinical

## 2020-02-07 ENCOUNTER — Other Ambulatory Visit: Payer: Self-pay

## 2020-02-07 DIAGNOSIS — F419 Anxiety disorder, unspecified: Secondary | ICD-10-CM | POA: Diagnosis not present

## 2020-02-07 NOTE — BH Specialist Note (Signed)
Integrated Behavioral Health Follow Up Visit  MRN: [id number] Name: Austin [name]  Number of Integrated Behavioral Health Clinician visits: 1/20 Session Start time: 9:15  Session End time: 10:15 Total time: 60  Type of Service: Integrated Behavioral Health- Individual/Family Interpretor:No. Interpretor Name and Language: none  SUBJECTIVE: Austin [name] is a 29 y.o. male accompanied by patient Patient was referred by NP, [name] for anxiety. Patient reports the following symptoms/concerns: anxiety Duration of problem: several months; Severity of problem: moderate  OBJECTIVE: Mood: Euthymic and Affect: Appropriate Risk of harm to self or others: No plan to harm self or others  LIFE CONTEXT: Family and Social: lives with wife and young son, has other family and friends in the area, contentious relationships with extended family members School/Work: works full time Self-Care: writing music, playing video games, exercising Life Changes: no recent significant life changes  GOALS ADDRESSED: Patient will: 1.  Reduce symptoms of: anxiety  2.  Increase knowledge and/or ability of: coping skills and self-management skills  3.  Demonstrate ability to: Increase healthy adjustment to current life circumstances  INTERVENTIONS: Interventions utilized:  Supportive Counseling Standardized Assessments completed: Not Needed  ASSESSMENT: Patient currently experiencing symptoms of anxiety and depression which are exacerbated by work and family stressors. Supportive counseling and goal setting today. Goals include improving sleep and eating habits, learning new skills and strategies for expressing emotions and healthy adjustment to current circumstances.  Patient may benefit from supportive counseling with CBT and problem solving as well as skills training to learn new coping strategies.  PLAN: 1. Follow up with behavioral health clinician on: 02/14/20 2. Behavioral  recommendations:  3. Referral(s): Integrated Behavioral Health Services (In Clinic) 4. "From scale of 1-10, how likely are you to follow plan?":   Abigail Butts, LCSW  [facility] [phone]

## 2020-02-11 ENCOUNTER — Telehealth: Payer: Self-pay | Admitting: Family Medicine

## 2020-02-12 ENCOUNTER — Other Ambulatory Visit: Payer: Self-pay | Admitting: Family Medicine

## 2020-02-12 ENCOUNTER — Telehealth: Payer: Self-pay | Admitting: Family Medicine

## 2020-02-12 DIAGNOSIS — F1190 Opioid use, unspecified, uncomplicated: Secondary | ICD-10-CM

## 2020-02-12 DIAGNOSIS — G894 Chronic pain syndrome: Secondary | ICD-10-CM

## 2020-02-12 DIAGNOSIS — D571 Sickle-cell disease without crisis: Secondary | ICD-10-CM

## 2020-02-12 MED ORDER — OXYCODONE HCL 10 MG PO TABS: 10.0000 mg | ORAL_TABLET | Freq: Four times a day (QID) | ORAL | 0 refills | Status: DC | PRN

## 2020-02-12 MED FILL — oxyCODONE HCL 10 MG TABS: 10 | 15 days supply | Qty: 60 | Fill #0

## 2020-02-13 NOTE — Telephone Encounter (Signed)
Done

## 2020-02-14 ENCOUNTER — Ambulatory Visit: Payer: 59 | Admitting: Clinical

## 2020-02-15 ENCOUNTER — Telehealth (INDEPENDENT_AMBULATORY_CARE_PROVIDER_SITE_OTHER): Payer: 59 | Admitting: Family Medicine

## 2020-02-15 ENCOUNTER — Other Ambulatory Visit: Payer: Self-pay

## 2020-02-15 DIAGNOSIS — F419 Anxiety disorder, unspecified: Secondary | ICD-10-CM | POA: Diagnosis not present

## 2020-02-15 DIAGNOSIS — F1190 Opioid use, unspecified, uncomplicated: Secondary | ICD-10-CM

## 2020-02-15 DIAGNOSIS — D571 Sickle-cell disease without crisis: Secondary | ICD-10-CM | POA: Diagnosis not present

## 2020-02-15 DIAGNOSIS — Z09 Encounter for follow-up examination after completed treatment for conditions other than malignant neoplasm: Secondary | ICD-10-CM

## 2020-02-15 DIAGNOSIS — G894 Chronic pain syndrome: Secondary | ICD-10-CM | POA: Diagnosis not present

## 2020-02-15 NOTE — Progress Notes (Signed)
Virtual Visit via Telephone Note  I connected with Austin [name] on 02/25/20 at  2:20 PM EDT by telephone and verified that I am speaking with the correct person using two identifiers.   I discussed the limitations, risks, security and privacy concerns of performing an evaluation and management service by telephone and the availability of in person appointments. I also discussed with the patient that there may be a patient responsible charge related to this service. The patient expressed understanding and agreed to proceed.   Televisit Today Patient Location: Home Provider Location: Office   History of Present Illness:  Past Surgical History:  Procedure Laterality Date  . WISDOM TOOTH EXTRACTION     Social History   Socioeconomic History  . Marital status: Married    Spouse name: Not on file  . Number of children: Not on file  . Years of education: Not on file  . Highest education level: Not on file  Occupational History  . Not on file  Tobacco Use  . Smoking status: Former Smoker    Packs/day: 0.10  . Smokeless tobacco: Never Used  Vaping Use  . Vaping Use: Never used  Substance and Sexual Activity  . Alcohol use: Yes    Comment: occ  . Drug use: Yes    Types: Marijuana  . Sexual activity: Yes  Other Topics Concern  . Not on file  Social History Narrative  . Not on file   Social Determinants of Health   Financial Resource Strain:   . Difficulty of Paying Living Expenses:   Food Insecurity:   . Worried About Programme researcher, broadcasting/film/video in the Last Year:   . Barista in the Last Year:   Transportation Needs:   . Freight forwarder (Medical):   Marland Kitchen Lack of Transportation (Non-Medical):   Physical Activity:   . Days of Exercise per Week:   . Minutes of Exercise per Session:   Stress:   . Feeling of Stress :   Social Connections:   . Frequency of Communication with Friends and Family:   . Frequency of Social Gatherings with Friends and Family:   .  Attends Religious Services:   . Active Member of Clubs or Organizations:   . Attends Banker Meetings:   Marland Kitchen Marital Status:   Intimate Partner Violence:   . Fear of Current or Ex-Partner:   . Emotionally Abused:   Marland Kitchen Physically Abused:   . Sexually Abused:     Family History  Problem Relation Age of Onset  . Sickle cell trait Mother   . Diabetes Father   . Hypertension Father     Past Medical History:  Diagnosis Date  . Acute maxillary sinusitis   . Eczema   . Sickle cell anemia (HCC) .    No Known Allergies   Current Status: Since his last office visit, he is doing well with no complaints. He states that he has pain in his arms and legs. He rates his pain today at 5/10. He has not had a hospital visit for Sickle Cell Crisis since 02/05/2020 where he was treated and discharged the same day. He is currently taking all medications as prescribed and staying well hydrated. He reports occasional nausea, constipation, dizziness and headaches. He denies fevers, chills, fatigue, recent infections, weight loss, and night sweats. He has not had any visual changes, and falls. No chest pain, heart palpitations, cough and shortness of breath reported. Denies GI problems such as vomiting,  and diarrhea. He has no reports of blood in stools, dysuria and hematuria. No depression or anxiety reported today. He denies suicidal ideations, homicidal ideations, or auditory hallucinations. He is taking all medications as prescribed.   No Known Allergies   Observations/Objective:  Telephone Visit   Assessment and Plan:  1. Sickle cell anemia without crisis [facility]) He is doing well today r/t his chronic pain management. He will continue to take pain medications as prescribed; will continue to avoid extreme heat and cold; will continue to eat a healthy diet and drink at least 64 ounces of water daily; continue stool softener as needed; will avoid colds and flu; will continue to get plenty of  sleep and rest; will continue to avoid high stressful situations and remain infection free; will continue Folic Acid 1 mg daily to avoid sickle cell crisis. Continue to follow up with Hematologist as needed.   2. Chronic pain syndrome  3. Chronic, continuous use of opioids  4. Anxiety  5. Follow up He will follow up in 2 months.   No orders of the defined types were placed in this encounter.   No orders of the defined types were placed in this encounter.   Referral Orders  No referral(s) requested today    Austin Ip,  MSN, FNP-BC [facility] Patient Care Center/Internal Medicine/Sickle Cell Center [facility] [address] [place], [place] [address] [phone] fax   I discussed the assessment and treatment plan with the patient. The patient was provided an opportunity to ask questions and all were answered. The patient agreed with the plan and demonstrated an understanding of the instructions.   The patient was advised to call back or seek an in-person evaluation if the symptoms worsen or if the condition fails to improve as anticipated.  I provided 20 minutes of non-face-to-face time during this encounter.   Kallie Locks, FNP

## 2020-02-21 ENCOUNTER — Ambulatory Visit (INDEPENDENT_AMBULATORY_CARE_PROVIDER_SITE_OTHER): Payer: 59 | Admitting: Clinical

## 2020-02-21 ENCOUNTER — Other Ambulatory Visit: Payer: Self-pay

## 2020-02-21 DIAGNOSIS — F419 Anxiety disorder, unspecified: Secondary | ICD-10-CM | POA: Diagnosis not present

## 2020-02-21 NOTE — BH Specialist Note (Signed)
Integrated Behavioral Health Follow Up Visit  MRN: [id number] Name: Austin [name]  Number of Integrated Behavioral Health Clinician visits: 2/20 Session Start time: 10:15  Session End time: 11:15 Total time: 60  Type of Service: Integrated Behavioral Health- Individual/Family Interpretor:No. Interpretor Name and Language: none  SUBJECTIVE: Austin [name] is a 29 y.o. male accompanied by self Patient was referred by NP, [name] for anxiety. Patient reports the following symptoms/concerns: anxiety Duration of problem: several months; Severity of problem: moderate  OBJECTIVE: Mood: Euthymic and Affect: Appropriate Risk of harm to self or others: No plan to harm self or others  LIFE CONTEXT: Family and Social: lives with wife and young son, has other family and friends in the area, contentious relationships with extended family members School/Work: works full time Self-Care: writing music, playing video games, exercising Life Changes: no recent significant life changes  GOALS ADDRESSED: Patient will: 1.  Reduce symptoms of: anxiety  2.  Increase knowledge and/or ability of: coping skills and self-management skills  3.  Demonstrate ability to: Increase healthy adjustment to current life circumstances  INTERVENTIONS: Interventions utilized:  Brief CBT and Supportive Counseling Standardized Assessments completed: Not Needed  ASSESSMENT: Patient currently experiencing symptoms ofanxiety and depressionwhich are exacerbated by work and family stressors. Patient reported improvement in work stressors recently. Supportive counseling and brief CBT today to process a new recent stressor and explore patient's related thoughts and emotions. Patient exhibits positive coping strategies.  Patient may benefit from supportive counseling with CBT and problem solving as well as skills training to learn new coping strategies.  PLAN: 1. Follow up with behavioral health clinician  on: week of 03/03/20 2. Behavioral recommendations:  3. Referral(s): Integrated Behavioral Health Services (In Clinic) 4. "From scale of 1-10, how likely are you to follow plan?":   Austin Butts, LCSW  [facility] [phone]

## 2020-02-26 ENCOUNTER — Telehealth: Payer: Self-pay | Admitting: Family Medicine

## 2020-02-27 ENCOUNTER — Other Ambulatory Visit: Payer: Self-pay | Admitting: Family Medicine

## 2020-02-27 DIAGNOSIS — R112 Nausea with vomiting, unspecified: Secondary | ICD-10-CM

## 2020-02-27 DIAGNOSIS — D571 Sickle-cell disease without crisis: Secondary | ICD-10-CM

## 2020-02-27 DIAGNOSIS — G894 Chronic pain syndrome: Secondary | ICD-10-CM

## 2020-02-27 DIAGNOSIS — F1190 Opioid use, unspecified, uncomplicated: Secondary | ICD-10-CM

## 2020-02-27 MED ORDER — PROMETHAZINE HCL 25 MG PO TABS: 25.0000 mg | ORAL_TABLET | Freq: Four times a day (QID) | ORAL | 0 refills | Status: DC | PRN

## 2020-02-27 MED ORDER — OXYCODONE HCL 10 MG PO TABS: 10.0000 mg | ORAL_TABLET | Freq: Four times a day (QID) | ORAL | 0 refills | Status: DC | PRN

## 2020-02-27 NOTE — Telephone Encounter (Signed)
Done

## 2020-02-29 ENCOUNTER — Other Ambulatory Visit: Payer: Self-pay

## 2020-02-29 ENCOUNTER — Telehealth (INDEPENDENT_AMBULATORY_CARE_PROVIDER_SITE_OTHER): Payer: 59 | Admitting: Family Medicine

## 2020-02-29 DIAGNOSIS — D571 Sickle-cell disease without crisis: Secondary | ICD-10-CM | POA: Diagnosis not present

## 2020-02-29 DIAGNOSIS — G894 Chronic pain syndrome: Secondary | ICD-10-CM

## 2020-02-29 DIAGNOSIS — F1190 Opioid use, unspecified, uncomplicated: Secondary | ICD-10-CM | POA: Diagnosis not present

## 2020-02-29 DIAGNOSIS — R112 Nausea with vomiting, unspecified: Secondary | ICD-10-CM

## 2020-02-29 DIAGNOSIS — Z09 Encounter for follow-up examination after completed treatment for conditions other than malignant neoplasm: Secondary | ICD-10-CM | POA: Diagnosis not present

## 2020-02-29 DIAGNOSIS — F419 Anxiety disorder, unspecified: Secondary | ICD-10-CM

## 2020-02-29 NOTE — Progress Notes (Signed)
Virtual Visit via Telephone Note  I connected with Austin [name] on 02/29/20 at  3:20 PM EDT by telephone and verified that I am speaking with the correct person using two identifiers.   I discussed the limitations, risks, security and privacy concerns of performing an evaluation and management service by telephone and the availability of in person appointments. I also discussed with the patient that there may be a patient responsible charge related to this service. The patient expressed understanding and agreed to proceed.  Televisit Today Patient Location: Home Provider Location: Office   History of Present Illness:  Social History   Socioeconomic History  . Marital status: Married    Spouse name: Not on file  . Number of children: Not on file  . Years of education: Not on file  . Highest education level: Not on file  Occupational History  . Not on file  Tobacco Use  . Smoking status: Former Smoker    Packs/day: 0.10  . Smokeless tobacco: Never Used  Vaping Use  . Vaping Use: Never used  Substance and Sexual Activity  . Alcohol use: Yes    Comment: occ  . Drug use: Yes    Types: Marijuana  . Sexual activity: Yes  Other Topics Concern  . Not on file  Social History Narrative  . Not on file   Social Determinants of Health   Financial Resource Strain:   . Difficulty of Paying Living Expenses:   Food Insecurity:   . Worried About Programme researcher, broadcasting/film/video in the Last Year:   . Barista in the Last Year:   Transportation Needs:   . Freight forwarder (Medical):   Marland Kitchen Lack of Transportation (Non-Medical):   Physical Activity:   . Days of Exercise per Week:   . Minutes of Exercise per Session:   Stress:   . Feeling of Stress :   Social Connections:   . Frequency of Communication with Friends and Family:   . Frequency of Social Gatherings with Friends and Family:   . Attends Religious Services:   . Active Member of Clubs or Organizations:   . Attends Occupational hygienist Meetings:   Marland Kitchen Marital Status:   Intimate Partner Violence:   . Fear of Current or Ex-Partner:   . Emotionally Abused:   Marland Kitchen Physically Abused:   . Sexually Abused:     Past Surgical History:  Procedure Laterality Date  . WISDOM TOOTH EXTRACTION      Family History  Problem Relation Age of Onset  . Sickle cell trait Mother   . Diabetes Father   . Hypertension Father     No Known Allergies   Patient Active Problem List   Diagnosis Date Noted  . Bilateral lower extremity pain 10/17/2019  . Sickle cell anemia without crisis (HCC) 09/20/2019  . Chronic pain syndrome 09/20/2019  . Nausea 09/20/2019  . Chronic, continuous use of opioids 09/20/2019  . Medication management 03/20/2019  . Bilious vomiting without nausea   . Viral gastroenteritis 01/23/2019  . Nausea vomiting and diarrhea   . Generalized abdominal pain   . Hb-SS disease with crisis (HCC)   . Dehydration   . Sickle cell pain crisis (HCC) 05/18/2018  . Acute sickle cell crisis (HCC) 12/15/2017  . Thrombocytopenia (HCC) 12/15/2017  . Splenomegaly 12/15/2017  . Sickle cell anemia with crisis (HCC) 12/15/2017  . 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 Spleen enlarged 08/18/2017   Current Status: Since his last  office visit, he is doing well with no complaints. He states that he has pain in his arms and legs. He rates his pain today at 5/10. He has not had a hospital visit for Sickle Cell Crisis since 02/05/2020 where he was treated and discharged the same day. He is currently taking all medications as prescribed and staying well hydrated. He reports occasional nausea, constipation, dizziness and headaches. He denies fevers, chills, fatigue, recent infections, weight loss, and night sweats. He has not had any headaches, visual changes, dizziness, and falls. No chest pain, heart palpitations, cough and shortness of breath reported. Denies GI problems such as vomiting, and diarrhea. He has no reports of  blood in stools, dysuria and hematuria. His anxiety is moderate today. He is taking all medications as prescribed.   Observations/Objective:  Telephone Visit  Assessment and Plan:  1. Sickle cell anemia without crisis [facility]) He is doing well today r/t his chronic pain management. He will continue to take pain medications as prescribed; will continue to avoid extreme heat and cold; will continue to eat a healthy diet and drink at least 64 ounces of water daily; continue stool softener as needed; will avoid colds and flu; will continue to get plenty of sleep and rest; will continue to avoid high stressful situations and remain infection free; will continue Folic Acid 1 mg daily to avoid sickle cell crisis. Continue to follow up with Hematologist as needed.   2. Chronic pain syndrome  3. Chronic, continuous use of opioids  4. Nausea and vomiting, intractability of vomiting not specified, unspecified vomiting type Stable today.   5. Anxiety  6. Follow up He will follow up in 2 months.    No orders of the defined types were placed in this encounter.   No orders of the defined types were placed in this encounter.   Referral Orders  No referral(s) requested today    [name],  MSN, FNP-BC [facility] Patient Care Center/Internal Medicine/Sickle Cell Center [facility] [address] [place], [place] [address] [phone] fax  I discussed the assessment and treatment plan with the patient. The patient was provided an opportunity to ask questions and all were answered. The patient agreed with the plan and demonstrated an understanding of the instructions.   The patient was advised to call back or seek an in-person evaluation if the symptoms worsen or if the condition fails to improve as anticipated.  I provided 20 minutes of non-face-to-face time during this encounter.   Kallie Locks, FNP

## 2020-03-05 ENCOUNTER — Encounter: Payer: Self-pay | Admitting: Family Medicine

## 2020-03-13 ENCOUNTER — Ambulatory Visit: Payer: 59 | Admitting: Clinical

## 2020-03-15 ENCOUNTER — Encounter: Payer: Self-pay | Admitting: Family Medicine

## 2020-03-18 ENCOUNTER — Telehealth: Payer: Self-pay | Admitting: Family Medicine

## 2020-03-18 ENCOUNTER — Other Ambulatory Visit: Payer: Self-pay | Admitting: Family Medicine

## 2020-03-18 DIAGNOSIS — D571 Sickle-cell disease without crisis: Secondary | ICD-10-CM

## 2020-03-18 DIAGNOSIS — G894 Chronic pain syndrome: Secondary | ICD-10-CM

## 2020-03-18 DIAGNOSIS — F1190 Opioid use, unspecified, uncomplicated: Secondary | ICD-10-CM

## 2020-03-18 MED ORDER — OXYCODONE HCL 10 MG PO TABS: 10.0000 mg | ORAL_TABLET | Freq: Four times a day (QID) | ORAL | 0 refills | Status: DC | PRN

## 2020-03-19 NOTE — Telephone Encounter (Signed)
Done

## 2020-03-21 ENCOUNTER — Telehealth: Payer: Self-pay | Admitting: Family Medicine

## 2020-03-21 ENCOUNTER — Other Ambulatory Visit: Payer: Self-pay | Admitting: Family Medicine

## 2020-03-21 DIAGNOSIS — R112 Nausea with vomiting, unspecified: Secondary | ICD-10-CM

## 2020-03-21 MED ORDER — PROMETHAZINE HCL 25 MG PO TABS: 25.0000 mg | ORAL_TABLET | Freq: Four times a day (QID) | ORAL | 0 refills | Status: DC | PRN

## 2020-03-24 NOTE — Telephone Encounter (Signed)
Done

## 2020-03-31 ENCOUNTER — Encounter: Payer: Self-pay | Admitting: Family Medicine

## 2020-04-01 ENCOUNTER — Telehealth: Payer: Self-pay | Admitting: Family Medicine

## 2020-04-01 NOTE — Telephone Encounter (Signed)
Spoke with patient at length about his concerns and the benefits of receiving COVID-19 Vaccine. Patient states that he has had detailed conversation with his wife about getting vaccine and retaining his current job with [employer]. He plans to get his 1st vaccine on 04/02/2020. Assured patient that he can contact our office if he has any adverse side effects from the vaccine for assessment. Patient thanked our office and seemed content with his decision to get vaccinated at this time.   [name],  MSN, FNP-BC [facility] Patient Care Center/Internal Medicine/Sickle [facility] [address] [place], [place] [address] [phone] fax

## 2020-04-04 ENCOUNTER — Other Ambulatory Visit: Payer: Self-pay | Admitting: Family Medicine

## 2020-04-04 ENCOUNTER — Telehealth: Payer: Self-pay | Admitting: Family Medicine

## 2020-04-04 DIAGNOSIS — D571 Sickle-cell disease without crisis: Secondary | ICD-10-CM

## 2020-04-04 DIAGNOSIS — G894 Chronic pain syndrome: Secondary | ICD-10-CM

## 2020-04-04 DIAGNOSIS — F1190 Opioid use, unspecified, uncomplicated: Secondary | ICD-10-CM

## 2020-04-04 MED ORDER — OXYCODONE HCL 10 MG PO TABS: 10.0000 mg | ORAL_TABLET | Freq: Four times a day (QID) | ORAL | 0 refills | Status: DC | PRN

## 2020-04-07 ENCOUNTER — Ambulatory Visit (INDEPENDENT_AMBULATORY_CARE_PROVIDER_SITE_OTHER): Payer: 59 | Admitting: Family Medicine

## 2020-04-07 ENCOUNTER — Other Ambulatory Visit: Payer: Self-pay

## 2020-04-07 ENCOUNTER — Encounter: Payer: Self-pay | Admitting: Family Medicine

## 2020-04-07 VITALS — BP 136/87 | HR 100 | Temp 99.3°F | Resp 18 | Ht 67.0 in | Wt 182.8 lb

## 2020-04-07 DIAGNOSIS — R112 Nausea with vomiting, unspecified: Secondary | ICD-10-CM | POA: Diagnosis not present

## 2020-04-07 DIAGNOSIS — F1190 Opioid use, unspecified, uncomplicated: Secondary | ICD-10-CM

## 2020-04-07 DIAGNOSIS — F419 Anxiety disorder, unspecified: Secondary | ICD-10-CM

## 2020-04-07 DIAGNOSIS — Z09 Encounter for follow-up examination after completed treatment for conditions other than malignant neoplasm: Secondary | ICD-10-CM | POA: Diagnosis not present

## 2020-04-07 DIAGNOSIS — G894 Chronic pain syndrome: Secondary | ICD-10-CM | POA: Diagnosis not present

## 2020-04-07 DIAGNOSIS — D571 Sickle-cell disease without crisis: Secondary | ICD-10-CM | POA: Diagnosis not present

## 2020-04-07 NOTE — Telephone Encounter (Signed)
Sent to Provider 

## 2020-04-07 NOTE — Progress Notes (Signed)
[facility]  Established Patient Office Visit  Subjective:  Patient ID: Austin [name], male    DOB: 11-14-1990  Age: 29 y.o. MRN: [id number]  CC:  Chief Complaint  Patient presents with  . Follow-up    Pt states no question or concerns he will like to discuss tosday.    HPI Austin [name] is a 29 year old male who presents for Follow Up today.   Patient Active Problem List   Diagnosis Date Noted  . Bilateral lower extremity pain 10/17/2019  . Sickle cell anemia without crisis (HCC) 09/20/2019  . Chronic pain syndrome 09/20/2019  . Nausea 09/20/2019  . Chronic, continuous use of opioids 09/20/2019  . Medication management 03/20/2019  . Bilious vomiting without nausea   . Viral gastroenteritis 01/23/2019  . Nausea vomiting and diarrhea   . Generalized abdominal pain   . Hb-SS disease with crisis (HCC)   . Dehydration   . Sickle cell pain crisis (HCC) 05/18/2018  . Acute sickle cell crisis (HCC) 12/15/2017  . Thrombocytopenia (HCC) 12/15/2017  . Splenomegaly 12/15/2017  . Sickle cell anemia with crisis (HCC) 12/15/2017  . 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 Spleen enlarged 08/18/2017    Current Status: Since his last office visit, he is doing well with no complaints. He states that he has pain in his back, arms and legs when he experiences crisis. He  rates his pain today at 0/10. He has not had a hospital visit for Sickle Cell Crisis since 02/04/2020 where he was treated and discharged the same day. He is currently taking all medications as prescribed and staying well hydrated. He reports occasional nausea, constipation, dizziness and headaches. His anxiety is increased today. He denies suicidal ideations, homicidal ideations, or auditory hallucinations. He denies fevers, chills, fatigue, recent infections, weight loss, and night sweats. He has not had any visual changes, and falls. No chest pain, heart  palpitations, cough and shortness of breath reported. Denies GI problems such as nausea, diarrhea, and constipation. He has no reports of blood in stools, dysuria and hematuria. He is taking all medications as prescribed. He denies pain today.   Past Medical History:  Diagnosis Date  . Acute maxillary sinusitis   . Eczema   . Sickle cell anemia (HCC) .    Past Surgical History:  Procedure Laterality Date  . WISDOM TOOTH EXTRACTION      Family History  Problem Relation Age of Onset  . Sickle cell trait Mother   . Diabetes Father   . Hypertension Father     Social History   Socioeconomic History  . Marital status: Married    Spouse name: Not on file  . Number of children: Not on file  . Years of education: Not on file  . Highest education level: Not on file  Occupational History  . Not on file  Tobacco Use  . Smoking status: Former Smoker    Packs/day: 0.10  . Smokeless tobacco: Never Used  Vaping Use  . Vaping Use: Never used  Substance and Sexual Activity  . Alcohol use: Yes    Comment: occ  . Drug use: Yes    Types: Marijuana  . Sexual activity: Yes  Other Topics Concern  . Not on file  Social History Narrative  . Not on file   Social Determinants of Health   Financial Resource Strain:   . Difficulty of Paying Living Expenses: Not on file  Food  Insecurity:   . Worried About Programme researcher, broadcasting/film/video in the Last Year: Not on file  . Ran Out of Food in the Last Year: Not on file  Transportation Needs:   . Lack of Transportation (Medical): Not on file  . Lack of Transportation (Non-Medical): Not on file  Physical Activity:   . Days of Exercise per Week: Not on file  . Minutes of Exercise per Session: Not on file  Stress:   . Feeling of Stress : Not on file  Social Connections:   . Frequency of Communication with Friends and Family: Not on file  . Frequency of Social Gatherings with Friends and Family: Not on file  . Attends Religious Services: Not on file  .  Active Member of Clubs or Organizations: Not on file  . Attends Banker Meetings: Not on file  . Marital Status: Not on file  Intimate Partner Violence:   . Fear of Current or Ex-Partner: Not on file  . Emotionally Abused: Not on file  . Physically Abused: Not on file  . Sexually Abused: Not on file    Outpatient Medications Prior to Visit  Medication Sig Dispense Refill  . ALPRAZolam (XANAX) 0.5 MG tablet Take 1 tablet (0.5 mg total) by mouth at bedtime as needed. 30 tablet 0  . loratadine (CLARITIN) 10 MG tablet Take 1 tablet (10 mg total) by mouth daily. 30 tablet 6  . ondansetron (ZOFRAN) 4 MG tablet Take 1 tablet (4 mg total) by mouth every 8 (eight) hours as needed for nausea or vomiting. 20 tablet 3  . Oxycodone HCl 10 MG TABS Take 1 tablet (10 mg total) by mouth every 6 (six) hours as needed for up to 15 days (pain). 60 tablet 0  . promethazine (PHENERGAN) 25 MG tablet Take 1 tablet (25 mg total) by mouth every 6 (six) hours as needed for nausea or vomiting. 30 tablet 0  . tetrahydrozoline 0.05 % ophthalmic solution Place 1 drop into both eyes 2 (two) times daily as needed (Red eyes).     No facility-administered medications prior to visit.    No Known Allergies  ROS Review of Systems  Constitutional: Negative.   HENT: Negative.   Eyes: Negative.   Respiratory: Negative.   Cardiovascular: Negative.   Gastrointestinal: Positive for constipation (occasional ) and nausea (occasional ).  Endocrine: Negative.   Genitourinary: Negative.   Musculoskeletal: Positive for arthralgias (generalized joint pain).  Skin: Negative.   Allergic/Immunologic: Negative.   Neurological: Positive for dizziness (occasional ) and headaches (occasional ).  Hematological: Negative.   Psychiatric/Behavioral: Positive for agitation. The patient is nervous/anxious and is hyperactive.       Objective:    Physical Exam Vitals and nursing note reviewed.  Constitutional:       Appearance: Normal appearance.  HENT:     Head: Normocephalic and atraumatic.     Nose: Nose normal.     Mouth/Throat:     Mouth: Mucous membranes are moist.     Pharynx: Oropharynx is clear.  Cardiovascular:     Rate and Rhythm: Normal rate and regular rhythm.     Pulses: Normal pulses.     Heart sounds: Normal heart sounds.  Pulmonary:     Effort: Pulmonary effort is normal.     Breath sounds: Normal breath sounds.  Abdominal:     General: Abdomen is flat. Bowel sounds are normal.     Palpations: Abdomen is soft.  Musculoskeletal:  General: Normal range of motion.     Cervical back: Normal range of motion and neck supple.  Skin:    General: Skin is warm and dry.  Neurological:     General: No focal deficit present.     Mental Status: He is alert and oriented to person, place, and time.  Psychiatric:        Thought Content: Thought content normal.        Judgment: Judgment normal.     Comments: Anxious/ Upset/ Angry    BP 136/87 (BP Location: Left Arm, Patient Position: Sitting, Cuff Size: Normal)   Pulse 100   Temp 99.3 F (37.4 C)   Resp 18   Ht 5\' 7"  (1.702 m)   Wt 182 lb 12.8 oz (82.9 kg)   SpO2 98%   BMI 28.63 kg/m  Wt Readings from Last 3 Encounters:  04/07/20 182 lb 12.8 oz (82.9 kg)  02/04/20 184 lb 9.6 oz (83.7 kg)  01/17/20 185 lb (83.9 kg)     Health Maintenance Due  Topic Date Due  . Hepatitis C Screening  Never done  . COVID-19 Vaccine (1) Never done  . TETANUS/TDAP  Never done  . INFLUENZA VACCINE  02/24/2020    There are no preventive care reminders to display for this patient.  Lab Results  Component Value Date   TSH 0.662 08/12/2017   Lab Results  Component Value Date   WBC 9.2 02/05/2020   HGB 13.6 02/05/2020   HCT 38.9 (L) 02/05/2020   MCV 80.9 02/05/2020   PLT 147 (L) 02/05/2020   Lab Results  Component Value Date   NA 136 02/05/2020   K 3.7 02/05/2020   CO2 24 02/05/2020   GLUCOSE 170 (H) 02/05/2020   BUN 8  02/05/2020   CREATININE 0.83 02/05/2020   BILITOT 1.3 (H) 02/05/2020   ALKPHOS 52 02/05/2020   AST 18 02/05/2020   ALT 41 02/05/2020   PROT 8.1 02/05/2020   ALBUMIN 4.5 02/05/2020   CALCIUM 9.1 02/05/2020   ANIONGAP 9 02/05/2020   No results found for: CHOL No results found for: HDL No results found for: LDLCALC No results found for: TRIG No results found for: [facility] Lab Results  Component Value Date   HGBA1C 4.2 08/12/2017    Assessment & Plan:   1. Sickle cell anemia without crisis [facility]) He is doing well today r/t his chronic pain management. He will continue to take pain medications as prescribed; will continue to avoid extreme heat and cold; will continue to eat a healthy diet and drink at least 64 ounces of water daily; continue stool softener as needed; will avoid colds and flu; will continue to get plenty of sleep and rest; will continue to avoid high stressful situations and remain infection free; will continue Folic Acid 1 mg daily to avoid sickle cell crisis. Continue to follow up with Hematologist as needed.   2. Chronic pain syndrome  3. Chronic, continuous use of opioids  4. Nausea and vomiting, intractability of vomiting not specified, unspecified vomiting type Stable today.   5. Anxiety Moderate. Monitor.   6. Follow up She will follow up in 2 months.   No orders of the defined types were placed in this encounter.   No orders of the defined types were placed in this encounter.   Referral Orders  No referral(s) requested today    [facility],  MSN, FNP-BC [place] [facility] [phone]  [address]  [place], [place] [address] [phone] fax    Problem List Items Addressed This Visit      Other   Chronic pain syndrome   Chronic, continuous use of opioids   Sickle cell anemia without crisis (HCC) - Primary    Other Visit Diagnoses    Nausea and  vomiting, intractability of vomiting not specified, unspecified vomiting type       Anxiety       Follow up          No orders of the defined types were placed in this encounter.   Follow-up: No follow-ups on file.    Kallie LocksNatalie M Keshon Markovitz, FNP

## 2020-04-09 ENCOUNTER — Encounter: Payer: Self-pay | Admitting: Family Medicine

## 2020-04-18 ENCOUNTER — Other Ambulatory Visit: Payer: Self-pay | Admitting: Family Medicine

## 2020-04-18 ENCOUNTER — Telehealth: Payer: Self-pay | Admitting: Family Medicine

## 2020-04-18 DIAGNOSIS — D571 Sickle-cell disease without crisis: Secondary | ICD-10-CM

## 2020-04-18 DIAGNOSIS — R112 Nausea with vomiting, unspecified: Secondary | ICD-10-CM

## 2020-04-18 DIAGNOSIS — G894 Chronic pain syndrome: Secondary | ICD-10-CM

## 2020-04-18 DIAGNOSIS — F1190 Opioid use, unspecified, uncomplicated: Secondary | ICD-10-CM

## 2020-04-18 MED ORDER — PROMETHAZINE HCL 25 MG PO TABS: 25.0000 mg | ORAL_TABLET | Freq: Four times a day (QID) | ORAL | 0 refills | Status: DC | PRN

## 2020-04-18 MED ORDER — OXYCODONE HCL 10 MG PO TABS: 10.0000 mg | ORAL_TABLET | Freq: Four times a day (QID) | ORAL | 0 refills | Status: AC | PRN

## 2020-04-21 ENCOUNTER — Encounter: Payer: Self-pay | Admitting: Family Medicine

## 2020-04-21 ENCOUNTER — Other Ambulatory Visit: Payer: Self-pay

## 2020-04-21 ENCOUNTER — Ambulatory Visit (INDEPENDENT_AMBULATORY_CARE_PROVIDER_SITE_OTHER): Payer: 59 | Admitting: Family Medicine

## 2020-04-21 VITALS — BP 117/73 | HR 81 | Temp 98.4°F | Resp 17 | Ht 67.0 in | Wt 185.8 lb

## 2020-04-21 DIAGNOSIS — R110 Nausea: Secondary | ICD-10-CM | POA: Diagnosis not present

## 2020-04-21 DIAGNOSIS — S76312A Strain of muscle, fascia and tendon of the posterior muscle group at thigh level, left thigh, initial encounter: Secondary | ICD-10-CM

## 2020-04-21 DIAGNOSIS — D571 Sickle-cell disease without crisis: Secondary | ICD-10-CM | POA: Diagnosis not present

## 2020-04-21 DIAGNOSIS — G894 Chronic pain syndrome: Secondary | ICD-10-CM

## 2020-04-21 DIAGNOSIS — Z09 Encounter for follow-up examination after completed treatment for conditions other than malignant neoplasm: Secondary | ICD-10-CM

## 2020-04-21 DIAGNOSIS — F1190 Opioid use, unspecified, uncomplicated: Secondary | ICD-10-CM | POA: Diagnosis not present

## 2020-04-21 DIAGNOSIS — F419 Anxiety disorder, unspecified: Secondary | ICD-10-CM | POA: Diagnosis not present

## 2020-04-21 MED ORDER — PREDNISONE 10 MG PO TABS: ORAL_TABLET | ORAL | 0 refills | Status: DC

## 2020-04-21 MED FILL — predniSONE 10 MG (21) TBPK: 10 | 6 days supply | Qty: 21 | Fill #0

## 2020-04-21 NOTE — Progress Notes (Signed)
[facility]   Established Patient Office Visit  Subjective:  Patient ID: Austin [name], male    DOB: 29-Mar-1991  Age: 29 y.o. MRN: [id number]  CC:  Chief Complaint  Patient presents with  . Follow-up    Pt stated she pulled his hemstring last night.     HPI Austin [name] is a 29 year old male who presents for Follow Up today.    Patient Active Problem List   Diagnosis Date Noted  . Bilateral lower extremity pain 10/17/2019  . Sickle cell anemia without crisis (HCC) 09/20/2019  . Chronic pain syndrome 09/20/2019  . Nausea 09/20/2019  . Chronic, continuous use of opioids 09/20/2019  . Medication management 03/20/2019  . Bilious vomiting without nausea   . Viral gastroenteritis 01/23/2019  . Nausea vomiting and diarrhea   . Generalized abdominal pain   . Hb-SS disease with crisis (HCC)   . Dehydration   . Sickle cell pain crisis (HCC) 05/18/2018  . Acute sickle cell crisis (HCC) 12/15/2017  . Thrombocytopenia (HCC) 12/15/2017  . Splenomegaly 12/15/2017  . Sickle cell anemia with crisis (HCC) 12/15/2017  . 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 Spleen enlarged 08/18/2017   Current Status: Since his last office visit, he is doing well with no complaints. He states that he has pain in his arms and legs. He rates his pain today at 5/10. He has not had a hospital visit for Sickle Cell Crisis since 02/23/2020 where he was treated and discharged the same day. He is currently taking all medications as prescribed and staying well hydrated. He reports occasional nausea, constipation, dizziness and headaches. He denies fevers, chills, recent iinfections, weight loss, and night sweats. He has not had any visual changes, and falls. No chest pain, heart palpitations, cough and shortness of breath reported. Denies GI problems such as vomiting, and diarrhea. He has no reports of blood in stools, dysuria and hematuria. No depression  or anxiety reported today.  He is taking all medications as prescribed.   Past Medical History:  Diagnosis Date  . Acute maxillary sinusitis   . Eczema   . Sickle cell anemia (HCC) .    Past Surgical History:  Procedure Laterality Date  . WISDOM TOOTH EXTRACTION      Family History  Problem Relation Age of Onset  . Sickle cell trait Mother   . Diabetes Father   . Hypertension Father     Social History   Socioeconomic History  . Marital status: Married    Spouse name: Not on file  . Number of children: Not on file  . Years of education: Not on file  . Highest education level: Not on file  Occupational History  . Not on file  Tobacco Use  . Smoking status: Former Smoker    Packs/day: 0.10  . Smokeless tobacco: Never Used  Vaping Use  . Vaping Use: Never used  Substance and Sexual Activity  . Alcohol use: Yes    Comment: occ  . Drug use: Yes    Types: Marijuana  . Sexual activity: Yes  Other Topics Concern  . Not on file  Social History Narrative  . Not on file   Social Determinants of Health   Financial Resource Strain:   . Difficulty of Paying Living Expenses: Not on file  Food Insecurity:   . Worried About Programme researcher, broadcasting/film/video in the Last Year: Not on file  . Ran Out  of Food in the Last Year: Not on file  Transportation Needs:   . Lack of Transportation (Medical): Not on file  . Lack of Transportation (Non-Medical): Not on file  Physical Activity:   . Days of Exercise per Week: Not on file  . Minutes of Exercise per Session: Not on file  Stress:   . Feeling of Stress : Not on file  Social Connections:   . Frequency of Communication with Friends and Family: Not on file  . Frequency of Social Gatherings with Friends and Family: Not on file  . Attends Religious Services: Not on file  . Active Member of Clubs or Organizations: Not on file  . Attends Banker Meetings: Not on file  . Marital Status: Not on file  Intimate Partner Violence:     . Fear of Current or Ex-Partner: Not on file  . Emotionally Abused: Not on file  . Physically Abused: Not on file  . Sexually Abused: Not on file    Outpatient Medications Prior to Visit  Medication Sig Dispense Refill  . ALPRAZolam (XANAX) 0.5 MG tablet Take 1 tablet (0.5 mg total) by mouth at bedtime as needed. 30 tablet 0  . loratadine (CLARITIN) 10 MG tablet Take 1 tablet (10 mg total) by mouth daily. 30 tablet 6  . ondansetron (ZOFRAN) 4 MG tablet Take 1 tablet (4 mg total) by mouth every 8 (eight) hours as needed for nausea or vomiting. 20 tablet 3  . Oxycodone HCl 10 MG TABS Take 1 tablet (10 mg total) by mouth every 6 (six) hours as needed for up to 15 days (pain). 60 tablet 0  . promethazine (PHENERGAN) 25 MG tablet Take 1 tablet (25 mg total) by mouth every 6 (six) hours as needed for nausea or vomiting. 30 tablet 0  . tetrahydrozoline 0.05 % ophthalmic solution Place 1 drop into both eyes 2 (two) times daily as needed (Red eyes).     No facility-administered medications prior to visit.    No Known Allergies  ROS Review of Systems  Constitutional: Negative.   HENT: Negative.   Eyes: Negative.   Respiratory: Positive for shortness of breath (occasional ).   Gastrointestinal: Positive for constipation (occasional ) and nausea (occasional ).  Endocrine: Negative.   Genitourinary: Negative.   Musculoskeletal: Positive for arthralgias (generalized joint pain).  Allergic/Immunologic: Negative.   Neurological: Positive for dizziness (occasional ) and headaches (occasional ).  Hematological: Negative.   Psychiatric/Behavioral: Negative.       Objective:    Physical Exam Vitals and nursing note reviewed.  Constitutional:      Appearance: Normal appearance.  HENT:     Head: Normocephalic and atraumatic.     Nose: Nose normal.     Mouth/Throat:     Mouth: Mucous membranes are moist.     Pharynx: Oropharynx is clear.  Cardiovascular:     Rate and Rhythm: Normal rate  and regular rhythm.     Pulses: Normal pulses.     Heart sounds: Normal heart sounds.  Pulmonary:     Effort: Pulmonary effort is normal.     Breath sounds: Normal breath sounds.  Abdominal:     General: Bowel sounds are normal.     Palpations: Abdomen is soft.  Musculoskeletal:        General: Normal range of motion.     Cervical back: Normal range of motion and neck supple.  Skin:    General: Skin is warm and dry.  Neurological:  General: No focal deficit present.     Mental Status: He is alert and oriented to person, place, and time.  Psychiatric:        Mood and Affect: Mood normal.        Behavior: Behavior normal.        Thought Content: Thought content normal.        Judgment: Judgment normal.     There were no vitals taken for this visit. Wt Readings from Last 3 Encounters:  04/07/20 182 lb 12.8 oz (82.9 kg)  02/04/20 184 lb 9.6 oz (83.7 kg)  01/17/20 185 lb (83.9 kg)     Health Maintenance Due  Topic Date Due  . Hepatitis C Screening  Never done  . COVID-19 Vaccine (1) Never done  . TETANUS/TDAP  Never done  . INFLUENZA VACCINE  02/24/2020    There are no preventive care reminders to display for this patient.  Lab Results  Component Value Date   TSH 0.662 08/12/2017   Lab Results  Component Value Date   WBC 9.2 02/05/2020   HGB 13.6 02/05/2020   HCT 38.9 (L) 02/05/2020   MCV 80.9 02/05/2020   PLT 147 (L) 02/05/2020   Lab Results  Component Value Date   NA 136 02/05/2020   K 3.7 02/05/2020   CO2 24 02/05/2020   GLUCOSE 170 (H) 02/05/2020   BUN 8 02/05/2020   CREATININE 0.83 02/05/2020   BILITOT 1.3 (H) 02/05/2020   ALKPHOS 52 02/05/2020   AST 18 02/05/2020   ALT 41 02/05/2020   PROT 8.1 02/05/2020   ALBUMIN 4.5 02/05/2020   CALCIUM 9.1 02/05/2020   ANIONGAP 9 02/05/2020   No results found for: CHOL No results found for: HDL No results found for: LDLCALC No results found for: TRIG No results found for: [facility] Lab Results    Component Value Date   HGBA1C 4.2 08/12/2017      Assessment & Plan:   1. Pulled hamstring, left initial  2. Sickle cell anemia without crisis [facility](HCC) He is doing well today r/t his chronic pain management. He will continue to take pain medications as prescribed; will continue to avoid extreme heat and cold; will continue to eat a healthy diet and drink at least 64 ounces of water daily; continue stool softener as needed; will avoid colds and flu; will continue to get plenty of sleep and rest; will continue to avoid high stressful situations and remain infection free; will continue Folic Acid 1 mg daily to avoid sickle cell crisis. Continue to follow up with Hematologist as needed.   3. Chronic pain syndrome  4. Chronic, continuous use of opioids  5. Nausea  6. Anxiety  7. Follow up He will follow up in 2 months.   No orders of the defined types were placed in this encounter.   No orders of the defined types were placed in this encounter.   Referral Orders  No referral(s) requested today    Raliegh IpNatalie Vesta Wheeland,  MSN, FNP-BC [facility] [address]  [place], [place] [address] [phone] fax  Problem List Items Addressed This Visit      Other   Chronic pain syndrome   Chronic, continuous use of opioids   Nausea   Sickle cell anemia without crisis (HCC) - Primary    Other Visit Diagnoses    Anxiety       Follow up  No orders of the defined types were placed in this encounter.   Follow-up: Return in about 2 months (around 06/21/2020).    Kallie Locks, FNP

## 2020-04-21 NOTE — Telephone Encounter (Signed)
Sent to Provider 

## 2020-04-21 NOTE — Patient Instructions (Signed)
RICE Therapy for Routine Care of Injuries Many injuries can be cared for with rest, ice, compression, and elevation (RICE therapy). This includes:  Resting the injured part.  Putting ice on the injury.  Putting pressure (compression) on the injury.  Raising the injured part (elevation). Using RICE therapy can help to lessen pain and swelling. Supplies needed:  Ice.  Plastic bag.  Towel.  Elastic bandage.  Pillow or pillows to raise (elevate) your injured body part. How to care for your injury with RICE therapy Rest Limit your normal activities, and try not to use the injured part of your body. You can go back to your normal activities when your doctor says it is okay to do them and you feel okay. Ask your doctor if you should do exercises to help your injury get better. Ice Put ice on the injured area. Do not put ice on your bare skin.  Put ice in a plastic bag.  Place a towel between your skin and the bag.  Leave the ice on for 20 minutes, 2-3 times a day. Use ice on as many days as told by your doctor.  Compression Compression means putting pressure on the injured area. This can be done with an elastic bandage. If an elastic bandage has been put on your injury:  Do not wrap the bandage too tight. Wrap the bandage more loosely if part of your body away from the bandage is blue, swollen, cold, painful, or loses feeling (gets numb).  Take off the bandage and put it on again. Do this every 3-4 hours or as told by your doctor.  See your doctor if the bandage seems to make your problems worse.  Elevation Elevation means keeping the injured area raised. If you can, raise the injured area above your heart or the center of your chest. Contact a doctor if:  You keep having pain and swelling.  Your symptoms get worse. Get help right away if:  You have sudden bad pain at your injury or lower than your injury.  You have redness or more swelling around your injury.  You  have tingling or numbness at your injury or lower than your injury, and it does not go away when you take off the bandage. Summary  Many injuries can be cared for using rest, ice, compression, and elevation (RICE therapy).  You can go back to your normal activities when you feel okay and your doctor says it is okay.  Put ice on the injured area as told by your doctor.  Get help if your symptoms get worse or if you keep having pain and swelling. This information is not intended to replace advice given to you by your health care provider. Make sure you discuss any questions you have with your health care provider. Document Revised: 04/01/2017 Document Reviewed: 04/01/2017 Elsevier Patient Education  2020 [employer] Hamstring Strain  A hamstring strain happens when the muscles in the back of the thighs (hamstring muscles) are overstretched or torn. The hamstring muscles are used in straightening the hips, bending the knees, and pulling back the legs. This injury is often called a pulled hamstring muscle. The tissue that connects the muscle to a bone (tendon) may also be affected. The severity of a hamstring strain may be rated in degrees or grades. First-degree (or grade 1) strains have the least amount of muscle tearing and pain. Second-degree and third-degree (grade 2 and 3) strains have increasingly more tearing and pain. What are the causes?  This condition is caused by a sudden, violent force being placed on the hamstring muscles, stretching them too far. This often happens during activities that involve running, jumping, kicking, or weight lifting. What increases the risk? Hamstring strains are especially common in athletes. The following factors may also make you more likely to develop this condition:  Having low strength, endurance, or flexibility of the hamstring muscles.  Doing high-impact physical activity or sports.  Having poor physical fitness.  Having a previous leg  injury.  Having tired (fatigued) muscles. What are the signs or symptoms? Symptoms of this condition include:  Pain in the back of the thigh.  Swelling.  Bruising.  Muscle spasms.  Trouble moving the affected muscle because of pain. For severe strains, you may feel popping or snapping in the back of your thigh when the injury occurs. How is this diagnosed? This condition is diagnosed based on your symptoms, your medical history, and a physical exam. How is this treated? Treatment for this condition usually involves:  Protecting, resting, icing, applying compression, and elevating the injured area (PRICE therapy).  Medicines. Your health care provider may recommend medicines to help reduce pain or inflammation.  Doing exercises to regain strength and flexibility in the muscles. Your health care provider will tell you when it is okay to begin exercising. Follow these instructions at home: PRICE therapy Use PRICE therapy to promote muscle healing during the first 2-3 days after your injury, or as told by your health care provider.  Protect the muscle from being injured again.  Rest your injury. This usually involves limiting your normal activities and not using the injured hamstring muscle. Talk with your health care provider about how you should limit your activities.  Apply ice to the injured area: ? Put ice in a plastic bag. ? Place a towel between your skin and the bag. ? Leave the ice on for 20 minutes, 2-3 times a day. After the third day, switch to applying heat as told.  Put pressure (compression) on your injured hamstring by wrapping it with an elastic bandage. Be careful not to wrap it too tightly. That may interfere with blood circulation or may increase swelling.  Raise (elevate) your injured hamstring above the level of your heart as often as possible. When you are lying down, you can do this by putting a pillow under your thigh.  Activity  Begin exercising or  stretching only as told by your health care provider.  Do not return to full activity level until your health care provider approves.  To help prevent muscle strains in the future, always warm up before exercising and stretch afterward. General instructions  Take over-the-counter and prescription medicines only as told by your health care provider.  If directed, apply heat to the affected area as often as told by your health care provider. Use the heat source that your health care provider recommends, such as a moist heat pack or a heating pad. ? Place a towel between your skin and the heat source. ? Leave the heat on for 20-30 minutes. ? Remove the heat if your skin turns bright red. This is especially important if you are unable to feel pain, heat, or cold. You may have a greater risk of getting burned.  Keep all follow-up visits as told by your health care provider. This is important. Contact a health care provider if you have:  Increasing pain or swelling in the injured area.  Numbness, tingling, or a significant loss of  strength in the injured area. Get help right away if:  Your foot or your toes become cold or turn blue. Summary  A hamstring strain happens when the muscles in the back of the thighs (hamstring muscles) are overstretched or torn.  This injury can be caused by a sudden, violent force being placed on the hamstring muscles, causing them to stretch too far.  Symptoms include pain, swelling, and muscle spasms in the injured area.  Treatment includes what is called PRICE therapy: protecting, resting, icing, applying compression, and elevating the injured area. This information is not intended to replace advice given to you by your health care provider. Make sure you discuss any questions you have with your health care provider. Document Revised: 06/24/2017 Document Reviewed: 06/09/2017 Elsevier Patient Education  2020 [employer].

## 2020-04-24 ENCOUNTER — Encounter: Payer: Self-pay | Admitting: Family Medicine

## 2020-04-30 ENCOUNTER — Encounter: Payer: Self-pay | Admitting: Family Medicine

## 2020-04-30 ENCOUNTER — Ambulatory Visit (INDEPENDENT_AMBULATORY_CARE_PROVIDER_SITE_OTHER): Payer: 59 | Admitting: Family Medicine

## 2020-04-30 ENCOUNTER — Other Ambulatory Visit: Payer: Self-pay

## 2020-04-30 VITALS — BP 130/78 | HR 79 | Temp 97.9°F | Resp 17 | Ht 67.0 in | Wt 186.0 lb

## 2020-04-30 DIAGNOSIS — D571 Sickle-cell disease without crisis: Secondary | ICD-10-CM

## 2020-04-30 DIAGNOSIS — G894 Chronic pain syndrome: Secondary | ICD-10-CM | POA: Diagnosis not present

## 2020-04-30 DIAGNOSIS — Z09 Encounter for follow-up examination after completed treatment for conditions other than malignant neoplasm: Secondary | ICD-10-CM

## 2020-04-30 DIAGNOSIS — Z760 Encounter for issue of repeat prescription: Secondary | ICD-10-CM | POA: Diagnosis not present

## 2020-04-30 DIAGNOSIS — R110 Nausea: Secondary | ICD-10-CM

## 2020-04-30 DIAGNOSIS — F1190 Opioid use, unspecified, uncomplicated: Secondary | ICD-10-CM | POA: Diagnosis not present

## 2020-04-30 DIAGNOSIS — F419 Anxiety disorder, unspecified: Secondary | ICD-10-CM | POA: Diagnosis not present

## 2020-04-30 DIAGNOSIS — S76302S Unspecified injury of muscle, fascia and tendon of the posterior muscle group at thigh level, left thigh, sequela: Secondary | ICD-10-CM | POA: Diagnosis not present

## 2020-04-30 NOTE — Progress Notes (Signed)
[facility]    Established Patient Office Visit  Subjective:  Patient ID: Austin [name], male    DOB: 11/08/1990  Age: 29 y.o. MRN: [id number]  CC:  Chief Complaint  Patient presents with  . Follow-up    Pt states no question or concerns to discuss today.    HPI Austin [name] is a 29 year old male who presents for Follow Up today.     Patient Active Problem List   Diagnosis Date Noted  . Bilateral lower extremity pain 10/17/2019  . Sickle cell anemia without crisis (HCC) 09/20/2019  . Chronic pain syndrome 09/20/2019  . Nausea 09/20/2019  . Chronic, continuous use of opioids 09/20/2019  . Medication management 03/20/2019  . Bilious vomiting without nausea   . Viral gastroenteritis 01/23/2019  . Nausea vomiting and diarrhea   . Generalized abdominal pain   . Hb-SS disease with crisis (HCC)   . Dehydration   . Sickle cell pain crisis (HCC) 05/18/2018  . Acute sickle cell crisis (HCC) 12/15/2017  . Thrombocytopenia (HCC) 12/15/2017  . Splenomegaly 12/15/2017  . Sickle cell anemia with crisis (HCC) 12/15/2017  . 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 Spleen enlarged 08/18/2017   Current Status: Since his last office visit, he is doing well with no complaints. He states that his has pain in his arms and legs. He rates his pain today at 0/10. He has not had a hospital visit for Sickle Cell Crisis since 02/13/2020 where he was treated and discharged the same day. He is currently taking all medications as prescribed and staying well hydrated. He reports occasional nausea, constipation, dizziness and headaches. His anxiety is mild today, r/t work, family, and personal stressors. He denies fevers, chills, fatigue, recent infections, weight loss, and night sweats. He has not had any visual changes, and falls. No chest pain, heart palpitations, cough and shortness of breath reported. Denies GI problems such as vomiting, and  diarrhea. He has no reports of blood in stools, dysuria and hematuria. He is taking all medications as prescribed. He denies pain today.  Past Medical History:  Diagnosis Date  . Acute maxillary sinusitis   . Eczema   . Sickle cell anemia (HCC) .    Past Surgical History:  Procedure Laterality Date  . WISDOM TOOTH EXTRACTION      Family History  Problem Relation Age of Onset  . Sickle cell trait Mother   . Diabetes Father   . Hypertension Father     Social History   Socioeconomic History  . Marital status: Married    Spouse name: Not on file  . Number of children: Not on file  . Years of education: Not on file  . Highest education level: Not on file  Occupational History  . Not on file  Tobacco Use  . Smoking status: Former Smoker    Packs/day: 0.10  . Smokeless tobacco: Never Used  Vaping Use  . Vaping Use: Never used  Substance and Sexual Activity  . Alcohol use: Yes    Comment: occ  . Drug use: Yes    Types: Marijuana  . Sexual activity: Yes  Other Topics Concern  . Not on file  Social History Narrative  . Not on file   Social Determinants of Health   Financial Resource Strain:   . Difficulty of Paying Living Expenses: Not on file  Food Insecurity:   . Worried About Programme researcher, broadcasting/film/video in the  Last Year: Not on file  . Ran Out of Food in the Last Year: Not on file  Transportation Needs:   . Lack of Transportation (Medical): Not on file  . Lack of Transportation (Non-Medical): Not on file  Physical Activity:   . Days of Exercise per Week: Not on file  . Minutes of Exercise per Session: Not on file  Stress:   . Feeling of Stress : Not on file  Social Connections:   . Frequency of Communication with Friends and Family: Not on file  . Frequency of Social Gatherings with Friends and Family: Not on file  . Attends Religious Services: Not on file  . Active Member of Clubs or Organizations: Not on file  . Attends Banker Meetings: Not on file   . Marital Status: Not on file  Intimate Partner Violence:   . Fear of Current or Ex-Partner: Not on file  . Emotionally Abused: Not on file  . Physically Abused: Not on file  . Sexually Abused: Not on file    Outpatient Medications Prior to Visit  Medication Sig Dispense Refill  . Oxycodone HCl 10 MG TABS Take 1 tablet (10 mg total) by mouth every 6 (six) hours as needed for up to 15 days (pain). 60 tablet 0  . promethazine (PHENERGAN) 25 MG tablet Take 1 tablet (25 mg total) by mouth every 6 (six) hours as needed for nausea or vomiting. 30 tablet 0  . ALPRAZolam (XANAX) 0.5 MG tablet Take 1 tablet (0.5 mg total) by mouth at bedtime as needed. (Patient not taking: Reported on 04/30/2020) 30 tablet 0  . loratadine (CLARITIN) 10 MG tablet Take 1 tablet (10 mg total) by mouth daily. (Patient not taking: Reported on 04/21/2020) 30 tablet 6  . ondansetron (ZOFRAN) 4 MG tablet Take 1 tablet (4 mg total) by mouth every 8 (eight) hours as needed for nausea or vomiting. (Patient not taking: Reported on 04/21/2020) 20 tablet 3  . tetrahydrozoline 0.05 % ophthalmic solution Place 1 drop into both eyes 2 (two) times daily as needed (Red eyes). (Patient not taking: Reported on 04/30/2020)    . predniSONE (DELTASONE) 10 MG tablet Day #1: Take 6 tablets by mouth Day #2: Take 5 tablets by mouth Day #3: Take 4 tablets by mouth Day #4: Take 3 tablets by mouth Day #5: Take 2 tablets by mouth Day #6: Take 1 tablet, then complete. (Patient not taking: Reported on 04/30/2020) 21 tablet 0   No facility-administered medications prior to visit.    No Known Allergies  ROS Review of Systems  Constitutional: Negative.   HENT: Negative.   Eyes: Negative.   Respiratory: Negative.   Cardiovascular: Negative.   Gastrointestinal: Positive for constipation (occasional ) and nausea (occasional ).  Endocrine: Negative.   Genitourinary: Negative.   Musculoskeletal: Positive for arthralgias (generalized joint pain).    Allergic/Immunologic: Negative.   Neurological: Positive for dizziness (occasional ) and headaches (occasional ).  Hematological: Negative.   Psychiatric/Behavioral: Negative.       Objective:    Physical Exam Vitals and nursing note reviewed.  Constitutional:      Appearance: Normal appearance.  HENT:     Head: Normocephalic and atraumatic.     Nose: Nose normal.     Mouth/Throat:     Mouth: Mucous membranes are moist.     Pharynx: Oropharynx is clear.  Cardiovascular:     Rate and Rhythm: Normal rate.     Pulses: Normal pulses.  Heart sounds: Normal heart sounds.  Pulmonary:     Effort: Pulmonary effort is normal.     Breath sounds: Normal breath sounds.  Abdominal:     General: Bowel sounds are normal.     Palpations: Abdomen is soft.  Musculoskeletal:        General: Normal range of motion.     Cervical back: Normal range of motion and neck supple.  Skin:    General: Skin is warm and dry.  Neurological:     General: No focal deficit present.     Mental Status: He is alert and oriented to person, place, and time.  Psychiatric:        Mood and Affect: Mood normal.        Behavior: Behavior normal.        Thought Content: Thought content normal.        Judgment: Judgment normal.    BP 130/78 (BP Location: Left Arm, Patient Position: Sitting, Cuff Size: Normal)   Pulse 79   Temp 97.9 F (36.6 C)   Resp 17   Ht 5\' 7"  (1.702 m)   Wt 186 lb (84.4 kg)   SpO2 100%   BMI 29.13 kg/m  Wt Readings from Last 3 Encounters:  04/30/20 186 lb (84.4 kg)  04/21/20 185 lb 12.8 oz (84.3 kg)  04/07/20 182 lb 12.8 oz (82.9 kg)     Health Maintenance Due  Topic Date Due  . Hepatitis C Screening  Never done  . COVID-19 Vaccine (1) Never done  . TETANUS/TDAP  Never done  . INFLUENZA VACCINE  02/24/2020    There are no preventive care reminders to display for this patient.  Lab Results  Component Value Date   TSH 0.662 08/12/2017   Lab Results  Component Value  Date   WBC 9.2 02/05/2020   HGB 13.6 02/05/2020   HCT 38.9 (L) 02/05/2020   MCV 80.9 02/05/2020   PLT 147 (L) 02/05/2020   Lab Results  Component Value Date   NA 136 02/05/2020   K 3.7 02/05/2020   CO2 24 02/05/2020   GLUCOSE 170 (H) 02/05/2020   BUN 8 02/05/2020   CREATININE 0.83 02/05/2020   BILITOT 1.3 (H) 02/05/2020   ALKPHOS 52 02/05/2020   AST 18 02/05/2020   ALT 41 02/05/2020   PROT 8.1 02/05/2020   ALBUMIN 4.5 02/05/2020   CALCIUM 9.1 02/05/2020   ANIONGAP 9 02/05/2020   No results found for: CHOL No results found for: HDL No results found for: LDLCALC No results found for: TRIG No results found for: [facility] Lab Results  Component Value Date   HGBA1C 4.2 08/12/2017      Assessment & Plan:   1. Sickle cell anemia without crisis [facility](HCC) He is doing well today r/t his chronic pain management. He will continue to take pain medications as prescribed; will continue to avoid extreme heat and cold; will continue to eat a healthy diet and drink at least 64 ounces of water daily; continue stool softener as needed; will avoid colds and flu; will continue to get plenty of sleep and rest; will continue to avoid high stressful situations and remain infection free; will continue Folic Acid 1 mg daily to avoid sickle cell crisis. Continue to follow up with Hematologist as needed.   2. Chronic pain syndrome  3. Chronic, continuous use of opioids  4. Hamstring injury, left, sequela  5. Nausea  6. Anxiety  7. Medication refill  8. Follow up He will  follow up in 2 months.   No orders of the defined types were placed in this encounter.   No orders of the defined types were placed in this encounter.   Referral Orders  No referral(s) requested today    [name],  MSN, FNP-BC [facility] [address] [place], [place] [address] [phone]- fax  Problem List  Items Addressed This Visit      Other   Chronic pain syndrome   Chronic, continuous use of opioids   Nausea   Sickle cell anemia without crisis (HCC) - Primary    Other Visit Diagnoses    Hamstring injury, left, sequela       Anxiety       Medication refill       Follow up          No orders of the defined types were placed in this encounter.   Follow-up: Return in about 2 months (around 06/30/2020).    Kallie Locks, FNP

## 2020-05-07 ENCOUNTER — Encounter: Payer: Self-pay | Admitting: Family Medicine

## 2020-05-18 ENCOUNTER — Other Ambulatory Visit: Payer: Self-pay

## 2020-05-18 ENCOUNTER — Emergency Department (HOSPITAL_COMMUNITY)
Admission: EM | Admit: 2020-05-18 | Discharge: 2020-05-18 | Disposition: A | Discharge status: Home / Self Care | Payer: 59 | Attending: Emergency Medicine | Admitting: Emergency Medicine

## 2020-05-18 ENCOUNTER — Encounter (HOSPITAL_COMMUNITY): Payer: Self-pay

## 2020-05-18 DIAGNOSIS — Z87891 Personal history of nicotine dependence: Secondary | ICD-10-CM | POA: Insufficient documentation

## 2020-05-18 DIAGNOSIS — D582 Other hemoglobinopathies: Secondary | ICD-10-CM | POA: Insufficient documentation

## 2020-05-18 DIAGNOSIS — G894 Chronic pain syndrome: Secondary | ICD-10-CM | POA: Diagnosis not present

## 2020-05-18 DIAGNOSIS — D57219 Sickle-cell/Hb-C disease with crisis, unspecified: Secondary | ICD-10-CM | POA: Diagnosis not present

## 2020-05-18 DIAGNOSIS — M545 Low back pain, unspecified: Secondary | ICD-10-CM | POA: Diagnosis present

## 2020-05-18 LAB — COMPREHENSIVE METABOLIC PANEL
ALT: 39 U/L (ref 0–44)
AST: 15 U/L (ref 15–41)
Albumin: 4.4 g/dL (ref 3.5–5.0)
Alkaline Phosphatase: 54 U/L (ref 38–126)
Anion gap: 9 (ref 5–15)
BUN: 9 mg/dL (ref 6–20)
CO2: 27 mmol/L (ref 22–32)
Calcium: 9.5 mg/dL (ref 8.9–10.3)
Chloride: 103 mmol/L (ref 98–111)
Creatinine, Ser: 0.84 mg/dL (ref 0.61–1.24)
GFR, Estimated: >60 mL/min (ref >60)
Glucose, Bld: 93 mg/dL (ref 70–99)
Potassium: 4.3 mmol/L (ref 3.5–5.1)
Sodium: 139 mmol/L (ref 135–145)
Total Bilirubin: 1.7 mg/dL — ABNORMAL HIGH (ref 0.3–1.2)
Total Protein: 7.6 g/dL (ref 6.5–8.1)

## 2020-05-18 LAB — CBC WITH DIFFERENTIAL/PLATELET
Abs Immature Granulocytes: 0.03 10*3/uL (ref 0.00–0.07)
Basophils Absolute: 0 10*3/uL (ref 0.0–0.1)
Basophils Relative: 0 %
Eosinophils Absolute: 0.1 10*3/uL (ref 0.0–0.5)
Eosinophils Relative: 2 %
HCT: 35.8 % — ABNORMAL LOW (ref 39.0–52.0)
Hemoglobin: 12.8 g/dL — ABNORMAL LOW (ref 13.0–17.0)
Immature Granulocytes: 0 %
Lymphocytes Relative: 27 %
Lymphs Abs: 2.4 10*3/uL (ref 0.7–4.0)
MCH: 28.8 pg (ref 26.0–34.0)
MCHC: 35.8 g/dL (ref 30.0–36.0)
MCV: 80.4 fL (ref 80.0–100.0)
Monocytes Absolute: 0.9 10*3/uL (ref 0.1–1.0)
Monocytes Relative: 10 %
Neutro Abs: 5.4 10*3/uL (ref 1.7–7.7)
Neutrophils Relative %: 61 %
Platelets: 117 10*3/uL — ABNORMAL LOW (ref 150–400)
RBC: 4.45 MIL/uL (ref 4.22–5.81)
RDW: 15.2 % (ref 11.5–15.5)
WBC: 8.9 10*3/uL (ref 4.0–10.5)
nRBC: 0.6 % — ABNORMAL HIGH (ref 0.0–0.2)

## 2020-05-18 LAB — RETICULOCYTES
Immature Retic Fract: 28.7 % — ABNORMAL HIGH (ref 2.3–15.9)
RBC.: 4.46 MIL/uL (ref 4.22–5.81)
Retic Count, Absolute: 248.4 10*3/uL — ABNORMAL HIGH (ref 19.0–186.0)
Retic Ct Pct: 5.6 % — ABNORMAL HIGH (ref 0.4–3.1)

## 2020-05-18 MED ORDER — HYDROMORPHONE HCL 2 MG/ML IJ SOLN
2.0000 mg | INTRAMUSCULAR | Status: AC
  Filled 2020-05-18: qty 1

## 2020-05-18 MED ORDER — HYDROMORPHONE HCL 2 MG/ML IJ SOLN: 2.0000 mg | INTRAMUSCULAR | Status: AC

## 2020-05-18 MED ORDER — SODIUM CHLORIDE 0.45 % IV SOLN: INTRAVENOUS | Status: DC

## 2020-05-18 MED ORDER — ONDANSETRON HCL 4 MG/2ML IJ SOLN
4.0000 mg | INTRAMUSCULAR | Status: DC | PRN
  Administered 2020-05-18: 4 mg via INTRAVENOUS
  Filled 2020-05-18: qty 2

## 2020-05-18 MED ORDER — KETOROLAC TROMETHAMINE 15 MG/ML IJ SOLN
15.0000 mg | INTRAMUSCULAR | Status: AC
  Administered 2020-05-18: 15 mg via INTRAVENOUS
  Filled 2020-05-18: qty 1

## 2020-05-18 MED ORDER — HYDROMORPHONE HCL 2 MG PO TABS
4.0000 mg | ORAL_TABLET | ORAL | Status: DC | PRN
  Administered 2020-05-18: 4 mg via ORAL
  Filled 2020-05-18: qty 2

## 2020-05-18 NOTE — Discharge Instructions (Addendum)
Continue taking your usual medications.  Follow-up with your doctor for problems.  It is not clear what is causing her back pain.  Of note, a CT scan from June 2020 did not show any serious problems with your spine.  Consider asking your doctor to evaluate your back pain further with either evaluation or treatment, by a spine specialist or consider physical therapy to help your ongoing back pain.

## 2020-05-18 NOTE — ED Triage Notes (Signed)
Pt arrived via walk in, c/o Sickle Cell Pain crisis starting approx 0430 this morning. Pain in bilateral back. Took oxycodone at home x3 hrs ago with little effect.

## 2020-05-18 NOTE — ED Provider Notes (Signed)
[facility]-EMERGENCY DEPT Provider Note   CSN: [id number] Arrival date & time: 05/18/20  6295     History Chief Complaint  Patient presents with  . Sickle Cell Pain Crisis    Austin [name] is a 29 y.o. male.  HPI He is here for evaluation of low back pain which "feels like a crisis."  He had similar pain several days ago which improved with oral medication.  He has chronic pain and sickle cell anemia with occasional crises.  He reports ongoing intermittent back pain since an injury to his lower back, when he was a child.  He does not get ongoing management and has never had a full diagnosis for spine problems.  He states his current pain, feels like a muscle soreness.  He is able to walk.  He has noticed some tingling in both feet, toes, on and off for the last couple of days.  There has been no weakness, falling, fever, chills, nausea, vomiting, shortness of breath, cough or chest pain.  He is taking his usual chronic pain medications.  There are no other known modifying factors.    Past Medical History:  Diagnosis Date  . Acute maxillary sinusitis   . Eczema   . Sickle cell anemia (HCC) .    Patient Active Problem List   Diagnosis Date Noted  . Bilateral lower extremity pain 10/17/2019  . Sickle cell anemia without crisis (HCC) 09/20/2019  . Chronic pain syndrome 09/20/2019  . Nausea 09/20/2019  . Chronic, continuous use of opioids 09/20/2019  . Medication management 03/20/2019  . Bilious vomiting without nausea   . Viral gastroenteritis 01/23/2019  . Nausea vomiting and diarrhea   . Generalized abdominal pain   . Hb-SS disease with crisis (HCC)   . Dehydration   . Sickle cell pain crisis (HCC) 05/18/2018  . Acute sickle cell crisis (HCC) 12/15/2017  . Thrombocytopenia (HCC) 12/15/2017  . Splenomegaly 12/15/2017  . Sickle cell anemia with crisis (HCC) 12/15/2017  . 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 Spleen enlarged 08/18/2017     Past Surgical History:  Procedure Laterality Date  . WISDOM TOOTH EXTRACTION         Family History  Problem Relation Age of Onset  . Sickle cell trait Mother   . Diabetes Father   . Hypertension Father     Social History   Tobacco Use  . Smoking status: Former Smoker    Packs/day: 0.10  . Smokeless tobacco: Never Used  Vaping Use  . Vaping Use: Never used  Substance Use Topics  . Alcohol use: Yes    Comment: occ  . Drug use: Yes    Types: Marijuana    Home Medications Prior to Admission medications   Medication Sig Start Date End Date Taking? Authorizing Provider  Oxycodone HCl 10 MG TABS Take 10 mg by mouth 4 (four) times daily as needed.  05/05/20  Yes [provider]  promethazine (PHENERGAN) 25 MG tablet Take 1 tablet (25 mg total) by mouth every 6 (six) hours as needed for nausea or vomiting. 04/18/20  Yes Kallie Locks, FNP  ALPRAZolam Prudy Feeler) 0.5 MG tablet Take 1 tablet (0.5 mg total) by mouth at bedtime as needed. Patient not taking: Reported on 04/30/2020 01/23/20   Kallie Locks, FNP  loratadine (CLARITIN) 10 MG tablet Take 1 tablet (10 mg total) by mouth daily. Patient not taking: Reported on 04/21/2020 10/03/18   Kallie Locks, FNP  ondansetron [facility]) 4  MG tablet Take 1 tablet (4 mg total) by mouth every 8 (eight) hours as needed for nausea or vomiting. Patient not taking: Reported on 04/21/2020 07/09/19   Kallie Locks, FNP  tetrahydrozoline 0.05 % ophthalmic solution Place 1 drop into both eyes 2 (two) times daily as needed (Red eyes). Patient not taking: Reported on 04/30/2020    [provider]    Allergies    Patient has no known allergies.  Review of Systems   Review of Systems  All other systems reviewed and are negative.   Physical Exam Updated Vital Signs BP 122/77   Pulse 64   Temp 97.8 F (36.6 C) (Oral)   Resp 16   SpO2 100%   Physical Exam Vitals and nursing note reviewed.  Constitutional:       General: He is not in acute distress.    Appearance: He is well-developed. He is not ill-appearing.  HENT:     Head: Normocephalic and atraumatic.     Right Ear: External ear normal.     Left Ear: External ear normal.  Eyes:     Conjunctiva/sclera: Conjunctivae normal.     Pupils: Pupils are equal, round, and reactive to light.  Neck:     Trachea: Phonation normal.  Cardiovascular:     Rate and Rhythm: Normal rate.  Pulmonary:     Effort: Pulmonary effort is normal.  Abdominal:     General: There is no distension.  Musculoskeletal:     Cervical back: Normal range of motion and neck supple.     Comments: He is able to sit up on the stretcher, but this causes low back pain which makes him grimace.  Normal range of motion arms and legs bilaterally.  Skin:    General: Skin is warm and dry.  Neurological:     Mental Status: He is alert and oriented to person, place, and time.     Cranial Nerves: No cranial nerve deficit.     Sensory: No sensory deficit.     Motor: No abnormal muscle tone.     Coordination: Coordination normal.  Psychiatric:        Mood and Affect: Mood normal.        Behavior: Behavior normal.        Thought Content: Thought content normal.        Judgment: Judgment normal.     ED Results / Procedures / Treatments   Labs (all labs ordered are listed, but only abnormal results are displayed) Labs Reviewed  CBC WITH DIFFERENTIAL/PLATELET - Abnormal; Notable for the following components:      Result Value   Hemoglobin 12.8 (*)    HCT 35.8 (*)    Platelets 117 (*)    nRBC 0.6 (*)    All other components within normal limits  RETICULOCYTES - Abnormal; Notable for the following components:   Retic Ct Pct 5.6 (*)    Retic Count, Absolute 248.4 (*)    Immature Retic Fract 28.7 (*)    All other components within normal limits  COMPREHENSIVE METABOLIC PANEL - Abnormal; Notable for the following components:   Total Bilirubin 1.7 (*)    All other components  within normal limits    EKG None  Radiology No results found.  Procedures Procedures (including critical care time)  Medications Ordered in ED Medications  0.45 % sodium chloride infusion ( Intravenous Stopped 05/18/20 1120)  HYDROmorphone (DILAUDID) injection 2 mg (2 mg Subcutaneous Refused 05/18/20 1101)  HYDROmorphone (DILAUDID)  injection 2 mg (2 mg Subcutaneous Refused 05/18/20 1102)  ondansetron (ZOFRAN) injection 4 mg (4 mg Intravenous Given 05/18/20 0956)  HYDROmorphone (DILAUDID) tablet 4 mg (4 mg Oral Given 05/18/20 1100)  ketorolac (TORADOL) 15 MG/ML injection 15 mg (15 mg Intravenous Given 05/18/20 0350)    ED Course  I have reviewed the triage vital signs and the nursing notes.  Pertinent labs & imaging results that were available during my care of the patient were reviewed by me and considered in my medical decision making (see chart for details).  Clinical Course as of May 18 1134  Wynelle Link May 18, 2020  1048 Patient told nursing he did not want to take subcutaneous Dilaudid.  I discussed with patient, why this was the case.  He states that when he goes to the sickle cell clinic for treatment he always gets this medication given by intravenous administration.  He initially stated he would be willing to try it subcutaneously then stated, "can you just give me a pill."  I ordered oral Dilaudid.   [EW]  1106 Normal  Comprehensive metabolic panel(!) [EW]  1106 Normal except hemoglobin slightly low, platelets low  CBC WITH DIFFERENTIAL(!) [EW]  1106 Reassuring reticulocyte count  Reticulocytes(!) [EW]    Clinical Course User Index [EW] Mancel Bale, MD   MDM Rules/Calculators/A&P                           Patient Vitals for the past 24 hrs:  BP Temp Temp src Pulse Resp SpO2  05/18/20 1000 122/77 -- -- 64 16 100 %  05/18/20 0915 122/78 -- -- 73 16 99 %  05/18/20 0813 (!) 139/93 97.8 F (36.6 C) Oral 88 16 99 %    11:05 AM Reevaluation with update and  discussion. After initial assessment and treatment, an updated evaluation reveals immediately after receiving an oral dose of hydromorphone, the patient has to be discharged.  This was not unexpected considering the discussion we had about 15 minutes ago.  The patient seems suspicious of my motives in prescribing subcutaneous hydromorphone. Mancel Bale   Medical Decision Making:  This patient is presenting for evaluation of  low back pain which feels like a sickle cell crisis, which does require a range of treatment options, and is a complaint that involves a moderate risk of morbidity and mortality. The differential diagnoses include muscle aches, chronic pain, sickle cell crisis. I decided to review old records, and in summary patient with chronic pain syndrome and 6 anemia with occasional crises but not a frequent utilizer ED services presenting with low back pain, without trauma.  He works a job requiring lifting.  He describes a injury when he was a child, while playing football.  I did not require additional historical information from anyone.  Clinical Laboratory Tests Ordered, included CBC, Metabolic panel and Reticulocyte count. Review indicates normal findings except hemoglobin slightly low, platelets low.  Reticulocyte count elevated   Critical Interventions-clinical evaluation, laboratory testing, medication treatment, observation and reassessment  After These Interventions, the Patient was reevaluated and was found stable for discharge.  He had an unusual response to the order of subcutaneous hydromorphone.  He states he has never had it nor is willing to try it today.  He has chronic pain, and hemoglobin C disease, not hemoglobin [place] or SS disease, as has been documented on prior visits.  There are numerous reports in the record of sickle cell anemia with crisis and  sickle cell pain crisis.  He takes chronic high-dose pain medications.  Today his evaluation and laboratory testing is  reassuring.  I doubt there is a significant injury or problem with his lower back.  Of note I reviewed the CT imaging from June 2020, when he had his abdomen pelvis imaged.  Review of bone windows from that test, are normal per my interpretation.  Patient chose to leave after single dose of oral hydromorphone, after declining routine treatment for sickle cell disease which he apparently does not actually have.  Interesting the patient uses the term crisis to describe his current condition.     There nursing Notes Reviewed/ Care Coordinated, and agree without changes. Applicable Imaging Reviewed.  Interpretation of Laboratory Data incorporated into ED treatment  EMR Information: Patient had a hemoglobinopathy evaluation, but blood testing, I am 08/02/2017-this result is in the EMR, and will be summarized as follows.  Hemoglobin F 2.6%, hemoglobin A 0%, hemoglobin S 0%, hemoglobin C 94%, hemoglobin A2 quantitation 3.4%.  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  CRITICAL CARE- No Performed by: Mancel BaleElliott Nazaret Chea  Nursing Notes Reviewed/ Care Coordinated Applicable Imaging Reviewed Interpretation of Laboratory Data incorporated into ED treatment  The patient appears reasonably screened and/or stabilized for discharge and I doubt any other medical condition or other [facility] requiring further screening, evaluation, or treatment in the ED at this time prior to discharge.  Plan: Home Medications-continue usual medications; Home Treatments-gradual advance diet and activity; return here if the recommended treatment, does not improve the symptoms; Recommended follow up-PCP, as needed     Final Clinical Impression(s) / ED Diagnoses Final diagnoses:  Chronic pain syndrome  Hemoglobin C-C disease (HCC)    Rx / DC Orders ED Discharge Orders    None       Mancel BaleWentz, Lindy Pennisi, MD 05/18/20 1136

## 2020-05-18 NOTE — ED Notes (Signed)
Pt twice refused subq hydromorphone and requested clarification. Informed provider.

## 2020-05-19 ENCOUNTER — Other Ambulatory Visit: Payer: Self-pay | Admitting: Nurse Practitioner

## 2020-05-19 ENCOUNTER — Telehealth: Payer: Self-pay | Admitting: Family Medicine

## 2020-05-19 DIAGNOSIS — R112 Nausea with vomiting, unspecified: Secondary | ICD-10-CM

## 2020-05-19 MED ORDER — PROMETHAZINE HCL 25 MG PO TABS: 25.0000 mg | ORAL_TABLET | Freq: Four times a day (QID) | ORAL | 0 refills | Status: DC | PRN

## 2020-05-19 NOTE — Telephone Encounter (Signed)
Sent!

## 2020-05-22 ENCOUNTER — Telehealth: Payer: Self-pay | Admitting: Family Medicine

## 2020-05-23 ENCOUNTER — Other Ambulatory Visit: Payer: Self-pay | Admitting: Nurse Practitioner

## 2020-05-23 MED ORDER — OXYCODONE HCL 10 MG PO TABS
10.0000 mg | ORAL_TABLET | Freq: Four times a day (QID) | ORAL | 0 refills | Status: DC | PRN
Start: 2020-05-23 — End: 2020-06-09

## 2020-05-23 NOTE — Telephone Encounter (Signed)
Refill sent.

## 2020-05-27 ENCOUNTER — Other Ambulatory Visit: Payer: Self-pay | Admitting: Family Medicine

## 2020-05-27 DIAGNOSIS — G894 Chronic pain syndrome: Secondary | ICD-10-CM

## 2020-05-27 DIAGNOSIS — F1190 Opioid use, unspecified, uncomplicated: Secondary | ICD-10-CM

## 2020-05-27 DIAGNOSIS — D571 Sickle-cell disease without crisis: Secondary | ICD-10-CM

## 2020-05-28 ENCOUNTER — Other Ambulatory Visit (INDEPENDENT_AMBULATORY_CARE_PROVIDER_SITE_OTHER): Payer: 59

## 2020-05-28 ENCOUNTER — Other Ambulatory Visit: Payer: Self-pay

## 2020-05-28 DIAGNOSIS — G894 Chronic pain syndrome: Secondary | ICD-10-CM

## 2020-05-28 DIAGNOSIS — F1190 Opioid use, unspecified, uncomplicated: Secondary | ICD-10-CM

## 2020-05-31 LAB — HGB FRACTIONATION BY HPLC
Hgb A2: 3.2 % (ref 1.8–3.2)
Hgb A: 0 % — ABNORMAL LOW (ref 96.4–98.8)
Hgb C: 94.6 % — ABNORMAL HIGH
Hgb E: 0 %
Hgb F: 2.2 % — ABNORMAL HIGH (ref 0.0–2.0)
Hgb S: 0 %
Hgb Variant: 0 %

## 2020-05-31 LAB — HGB FRACTIONATION CASCADE

## 2020-06-06 ENCOUNTER — Ambulatory Visit: Payer: 59 | Admitting: Family Medicine

## 2020-06-06 ENCOUNTER — Telehealth: Payer: Self-pay | Admitting: Family Medicine

## 2020-06-09 ENCOUNTER — Telehealth: Payer: Self-pay | Admitting: Family Medicine

## 2020-06-09 ENCOUNTER — Other Ambulatory Visit: Payer: Self-pay | Admitting: Family Medicine

## 2020-06-09 DIAGNOSIS — G894 Chronic pain syndrome: Secondary | ICD-10-CM

## 2020-06-09 DIAGNOSIS — D582 Other hemoglobinopathies: Secondary | ICD-10-CM

## 2020-06-09 DIAGNOSIS — F1190 Opioid use, unspecified, uncomplicated: Secondary | ICD-10-CM

## 2020-06-09 DIAGNOSIS — D5700 Hb-SS disease with crisis, unspecified: Secondary | ICD-10-CM

## 2020-06-09 MED ORDER — OXYCODONE HCL 10 MG PO TABS: 10.0000 mg | ORAL_TABLET | Freq: Four times a day (QID) | ORAL | 0 refills | Status: DC | PRN

## 2020-06-09 NOTE — Telephone Encounter (Signed)
Sent to provider 

## 2020-06-10 NOTE — Telephone Encounter (Signed)
Sent to provider 

## 2020-06-23 ENCOUNTER — Telehealth: Payer: Self-pay | Admitting: Family Medicine

## 2020-06-23 ENCOUNTER — Other Ambulatory Visit: Payer: Self-pay | Admitting: Family Medicine

## 2020-06-23 DIAGNOSIS — G894 Chronic pain syndrome: Secondary | ICD-10-CM

## 2020-06-23 DIAGNOSIS — R112 Nausea with vomiting, unspecified: Secondary | ICD-10-CM

## 2020-06-23 DIAGNOSIS — D582 Other hemoglobinopathies: Secondary | ICD-10-CM

## 2020-06-23 DIAGNOSIS — F1190 Opioid use, unspecified, uncomplicated: Secondary | ICD-10-CM

## 2020-06-23 MED ORDER — PROMETHAZINE HCL 25 MG PO TABS: 25.0000 mg | ORAL_TABLET | Freq: Four times a day (QID) | ORAL | 0 refills | Status: DC | PRN

## 2020-06-23 MED ORDER — OXYCODONE HCL 10 MG PO TABS: 10.0000 mg | ORAL_TABLET | Freq: Four times a day (QID) | ORAL | 0 refills | Status: DC | PRN

## 2020-06-24 NOTE — Telephone Encounter (Signed)
DOne

## 2020-06-30 ENCOUNTER — Telehealth (HOSPITAL_COMMUNITY): Payer: Self-pay | Admitting: *Deleted

## 2020-06-30 NOTE — Telephone Encounter (Signed)
Patient called requesting to come to the day hospital for sickle cell pain. Patient reports back and chest pain rated 10/10. Reports taking Oxycodone 20 minutes ago. COVID-19 screening done and patient denies all symptoms and exposures. Denies fever, nausea, vomiting, diarrhea, abdominal pain and priapism. Patient reports that his chest pain is atypical of his sickle cell crisis and is affected by his breathing. [place], FNP notified and advised that patient go to the ED for evaluation of his chest pain. Patient advised and expresses an understanding.

## 2020-07-01 ENCOUNTER — Ambulatory Visit (INDEPENDENT_AMBULATORY_CARE_PROVIDER_SITE_OTHER): Payer: 59 | Admitting: Family Medicine

## 2020-07-01 ENCOUNTER — Other Ambulatory Visit: Payer: Self-pay

## 2020-07-01 ENCOUNTER — Encounter: Payer: Self-pay | Admitting: Family Medicine

## 2020-07-01 VITALS — BP 123/82 | HR 75 | Temp 97.2°F | Resp 16 | Ht 67.0 in | Wt 191.6 lb

## 2020-07-01 DIAGNOSIS — G894 Chronic pain syndrome: Secondary | ICD-10-CM | POA: Diagnosis not present

## 2020-07-01 DIAGNOSIS — D571 Sickle-cell disease without crisis: Secondary | ICD-10-CM | POA: Diagnosis not present

## 2020-07-01 DIAGNOSIS — F419 Anxiety disorder, unspecified: Secondary | ICD-10-CM

## 2020-07-01 DIAGNOSIS — Z09 Encounter for follow-up examination after completed treatment for conditions other than malignant neoplasm: Secondary | ICD-10-CM | POA: Diagnosis not present

## 2020-07-01 DIAGNOSIS — R110 Nausea: Secondary | ICD-10-CM | POA: Diagnosis not present

## 2020-07-01 DIAGNOSIS — F1190 Opioid use, unspecified, uncomplicated: Secondary | ICD-10-CM

## 2020-07-01 NOTE — Progress Notes (Signed)
[facility]   Established Patient Office Visit  Subjective:  Patient ID: Austin [name], male    DOB: 02-24-1991  Age: 29 y.o. MRN: [id number]  CC:  Chief Complaint  Patient presents with  . Follow-up    HPI Austin [name] is a 29 year old male who presents for Follow Up today.     Patient Active Problem List   Diagnosis Date Noted  . Bilateral lower extremity pain 10/17/2019  . Sickle cell anemia without crisis (HCC) 09/20/2019  . Chronic pain syndrome 09/20/2019  . Nausea 09/20/2019  . Chronic, continuous use of opioids 09/20/2019  . Medication management 03/20/2019  . Bilious vomiting without nausea   . Viral gastroenteritis 01/23/2019  . Nausea vomiting and diarrhea   . Generalized abdominal pain   . Hb-SS disease with crisis (HCC)   . Dehydration   . Sickle cell pain crisis (HCC) 05/18/2018  . Acute sickle cell crisis (HCC) 12/15/2017  . Thrombocytopenia (HCC) 12/15/2017  . Splenomegaly 12/15/2017  . Sickle cell anemia with crisis (HCC) 12/15/2017  . 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 Spleen enlarged 08/18/2017    Current Status: Since his last office visit, he is doing well with no complaints. He states that he has pain in his arms and legs. He rates his pain today at 0/10. He has not had a hospital visit for Sickle Cell Crisis since 05/18/2020 where he was treated and discharged the same day. He is currently taking all medications as prescribed and staying well hydrated. He reports occasional nausea, constipation, dizziness and headaches.  He recently relocated 3 weeks ago. He denies fevers, chills, recent infections, weight loss, and night sweats. He has not had any visual changes, and falls. No chest pain, heart palpitations, cough and shortness of breath reported. Denies GI problems such as vomiting, and diarrhea. He has no reports of blood in stools, dysuria and hematuria. No depression or anxiety  reported today. He is taking all medications as prescribed. He denies pain today.    Past Medical History:  Diagnosis Date  . Acute maxillary sinusitis   . Eczema   . Sickle cell anemia (HCC) .    Past Surgical History:  Procedure Laterality Date  . WISDOM TOOTH EXTRACTION      Family History  Problem Relation Age of Onset  . Sickle cell trait Mother   . Diabetes Father   . Hypertension Father     Social History   Socioeconomic History  . Marital status: Married    Spouse name: Not on file  . Number of children: Not on file  . Years of education: Not on file  . Highest education level: Not on file  Occupational History  . Not on file  Tobacco Use  . Smoking status: Former Smoker    Packs/day: 0.10  . Smokeless tobacco: Never Used  Vaping Use  . Vaping Use: Never used  Substance and Sexual Activity  . Alcohol use: Yes    Comment: occ  . Drug use: Yes    Types: Marijuana  . Sexual activity: Yes  Other Topics Concern  . Not on file  Social History Narrative  . Not on file   Social Determinants of Health   Financial Resource Strain:   . Difficulty of Paying Living Expenses: Not on file  Food Insecurity:   . Worried About Programme researcher, broadcasting/film/video in the Last Year: Not on file  . Ran Out  of Food in the Last Year: Not on file  Transportation Needs:   . Lack of Transportation (Medical): Not on file  . Lack of Transportation (Non-Medical): Not on file  Physical Activity:   . Days of Exercise per Week: Not on file  . Minutes of Exercise per Session: Not on file  Stress:   . Feeling of Stress : Not on file  Social Connections:   . Frequency of Communication with Friends and Family: Not on file  . Frequency of Social Gatherings with Friends and Family: Not on file  . Attends Religious Services: Not on file  . Active Member of Clubs or Organizations: Not on file  . Attends BankerClub or Organization Meetings: Not on file  . Marital Status: Not on file  Intimate Partner  Violence:   . Fear of Current or Ex-Partner: Not on file  . Emotionally Abused: Not on file  . Physically Abused: Not on file  . Sexually Abused: Not on file    Outpatient Medications Prior to Visit  Medication Sig Dispense Refill  . ondansetron (ZOFRAN) 4 MG tablet Take 1 tablet (4 mg total) by mouth every 8 (eight) hours as needed for nausea or vomiting. 20 tablet 3  . Oxycodone HCl 10 MG TABS Take 1 tablet (10 mg total) by mouth 4 (four) times daily as needed. 60 tablet 0  . promethazine (PHENERGAN) 25 MG tablet Take 1 tablet (25 mg total) by mouth every 6 (six) hours as needed for nausea or vomiting. 30 tablet 0  . loratadine (CLARITIN) 10 MG tablet Take 1 tablet (10 mg total) by mouth daily. (Patient not taking: Reported on 04/21/2020) 30 tablet 6  . ALPRAZolam (XANAX) 0.5 MG tablet Take 1 tablet (0.5 mg total) by mouth at bedtime as needed. (Patient not taking: Reported on 04/30/2020) 30 tablet 0  . tetrahydrozoline 0.05 % ophthalmic solution Place 1 drop into both eyes 2 (two) times daily as needed (Red eyes). (Patient not taking: Reported on 04/30/2020)     No facility-administered medications prior to visit.    No Known Allergies  ROS Review of Systems  Constitutional: Negative.   HENT: Negative.   Eyes: Negative.   Respiratory: Negative.   Cardiovascular: Negative.   Gastrointestinal: Negative.   Endocrine: Negative.   Genitourinary: Negative.   Musculoskeletal: Positive for arthralgias (generalized joint pain).  Skin: Negative.   Allergic/Immunologic: Negative.   Neurological: Positive for dizziness (occasional ) and headaches (occasional ).  Hematological: Negative.   Psychiatric/Behavioral: Negative.       Objective:    Physical Exam Vitals and nursing note reviewed.  Constitutional:      Appearance: Normal appearance.  HENT:     Head: Normocephalic and atraumatic.     Nose: Nose normal.     Mouth/Throat:     Mouth: Mucous membranes are moist.     Pharynx:  Oropharynx is clear.  Cardiovascular:     Rate and Rhythm: Normal rate and regular rhythm.     Pulses: Normal pulses.     Heart sounds: Normal heart sounds.  Pulmonary:     Effort: Pulmonary effort is normal.     Breath sounds: Normal breath sounds.  Abdominal:     General: Bowel sounds are normal.     Palpations: Abdomen is soft.  Musculoskeletal:        General: Normal range of motion.     Cervical back: Normal range of motion and neck supple.  Skin:    General: Skin is  warm and dry.  Neurological:     General: No focal deficit present.     Mental Status: He is alert and oriented to person, place, and time.  Psychiatric:        Mood and Affect: Mood normal.        Behavior: Behavior normal.        Thought Content: Thought content normal.        Judgment: Judgment normal.    BP 123/82 (BP Location: Left Arm, Patient Position: Sitting, Cuff Size: Normal)   Pulse 75   Temp (!) 97.2 F (36.2 C) (Temporal)   Resp 16   Ht 5\' 7"  (1.702 m)   Wt 191 lb 9.6 oz (86.9 kg)   SpO2 100%   BMI 30.01 kg/m  Wt Readings from Last 3 Encounters:  07/01/20 191 lb 9.6 oz (86.9 kg)  04/30/20 186 lb (84.4 kg)  04/21/20 185 lb 12.8 oz (84.3 kg)     Health Maintenance Due  Topic Date Due  . Hepatitis C Screening  Never done    There are no preventive care reminders to display for this patient.  Lab Results  Component Value Date   TSH 0.662 08/12/2017   Lab Results  Component Value Date   WBC 8.9 05/18/2020   HGB 12.8 (L) 05/18/2020   HCT 35.8 (L) 05/18/2020   MCV 80.4 05/18/2020   PLT 117 (L) 05/18/2020   Lab Results  Component Value Date   NA 139 05/18/2020   K 4.3 05/18/2020   CO2 27 05/18/2020   GLUCOSE 93 05/18/2020   BUN 9 05/18/2020   CREATININE 0.84 05/18/2020   BILITOT 1.7 (H) 05/18/2020   ALKPHOS 54 05/18/2020   AST 15 05/18/2020   ALT 39 05/18/2020   PROT 7.6 05/18/2020   ALBUMIN 4.4 05/18/2020   CALCIUM 9.5 05/18/2020   ANIONGAP 9 05/18/2020   No  results found for: CHOL No results found for: HDL No results found for: LDLCALC No results found for: TRIG No results found for: [facility] Lab Results  Component Value Date   HGBA1C 4.2 08/12/2017    Assessment & Plan:   1. Sickle cell anemia without crisis [facility]) He is doing well today r/t his chronic pain management. He will continue to take pain medications as prescribed; will continue to avoid extreme heat and cold; will continue to eat a healthy diet and drink at least 64 ounces of water daily; continue stool softener as needed; will avoid colds and flu; will continue to get plenty of sleep and rest; will continue to avoid high stressful situations and remain infection free; will continue Folic Acid 1 mg daily to avoid sickle cell crisis. Continue to follow up with Hematologist as needed.   2. Chronic, continuous use of opioids  3. Chronic pain syndrome  4. Nausea  5. Anxiety  6. Follow up He will follow up in 2 months.   No orders of the defined types were placed in this encounter.   No orders of the defined types were placed in this encounter.   Referral Orders  No referral(s) requested today    [facility], MSN, ANE, FNP-BC [address] [facility] Medicine/S[facility]le Cell Center [facility] [address] [place], [place] [phone]- fax  Problem List Items Addressed This Visit      Other   Chronic pain syndrome   Chronic, continuous use of opioids   Nausea   Sickle cell anemia without crisis (  HCC) - Primary    Other Visit Diagnoses    Anxiety       Follow up          No orders of the defined types were placed in this encounter.   Follow-up: Return in about 2 months (around 09/01/2020).    Kallie Locks, FNP

## 2020-07-03 ENCOUNTER — Encounter: Payer: Self-pay | Admitting: Family Medicine

## 2020-07-08 ENCOUNTER — Telehealth: Payer: Self-pay | Admitting: Family Medicine

## 2020-07-08 ENCOUNTER — Other Ambulatory Visit: Payer: Self-pay | Admitting: Family Medicine

## 2020-07-08 DIAGNOSIS — G894 Chronic pain syndrome: Secondary | ICD-10-CM

## 2020-07-08 DIAGNOSIS — F1190 Opioid use, unspecified, uncomplicated: Secondary | ICD-10-CM

## 2020-07-08 DIAGNOSIS — D582 Other hemoglobinopathies: Secondary | ICD-10-CM

## 2020-07-08 MED ORDER — OXYCODONE HCL 10 MG PO TABS: 10.0000 mg | ORAL_TABLET | Freq: Four times a day (QID) | ORAL | 0 refills | Status: DC | PRN

## 2020-07-08 NOTE — Telephone Encounter (Signed)
Done

## 2020-07-22 ENCOUNTER — Telehealth: Payer: Self-pay | Admitting: Family Medicine

## 2020-07-22 ENCOUNTER — Other Ambulatory Visit: Payer: Self-pay | Admitting: Family Medicine

## 2020-07-22 DIAGNOSIS — D582 Other hemoglobinopathies: Secondary | ICD-10-CM

## 2020-07-22 DIAGNOSIS — G894 Chronic pain syndrome: Secondary | ICD-10-CM

## 2020-07-22 DIAGNOSIS — F1190 Opioid use, unspecified, uncomplicated: Secondary | ICD-10-CM

## 2020-07-22 DIAGNOSIS — R112 Nausea with vomiting, unspecified: Secondary | ICD-10-CM

## 2020-07-22 MED ORDER — OXYCODONE HCL 10 MG PO TABS: 10.0000 mg | ORAL_TABLET | Freq: Four times a day (QID) | ORAL | 0 refills | Status: DC | PRN

## 2020-07-22 MED ORDER — PROMETHAZINE HCL 25 MG PO TABS: 25.0000 mg | ORAL_TABLET | Freq: Four times a day (QID) | ORAL | 0 refills | Status: DC | PRN

## 2020-07-22 NOTE — Telephone Encounter (Signed)
Done

## 2020-08-06 ENCOUNTER — Telehealth: Payer: Self-pay | Admitting: Family Medicine

## 2020-08-06 ENCOUNTER — Other Ambulatory Visit: Payer: Self-pay | Admitting: Family Medicine

## 2020-08-06 DIAGNOSIS — G894 Chronic pain syndrome: Secondary | ICD-10-CM

## 2020-08-06 DIAGNOSIS — F1190 Opioid use, unspecified, uncomplicated: Secondary | ICD-10-CM

## 2020-08-06 DIAGNOSIS — D582 Other hemoglobinopathies: Secondary | ICD-10-CM

## 2020-08-06 MED ORDER — OXYCODONE HCL 10 MG PO TABS: 10.0000 mg | ORAL_TABLET | Freq: Four times a day (QID) | ORAL | 0 refills | Status: DC | PRN

## 2020-08-06 NOTE — Telephone Encounter (Signed)
Done

## 2020-08-19 ENCOUNTER — Other Ambulatory Visit: Payer: Self-pay | Admitting: Family Medicine

## 2020-08-19 ENCOUNTER — Telehealth: Payer: Self-pay | Admitting: Family Medicine

## 2020-08-19 DIAGNOSIS — R112 Nausea with vomiting, unspecified: Secondary | ICD-10-CM

## 2020-08-19 DIAGNOSIS — D582 Other hemoglobinopathies: Secondary | ICD-10-CM

## 2020-08-19 DIAGNOSIS — G894 Chronic pain syndrome: Secondary | ICD-10-CM

## 2020-08-19 DIAGNOSIS — F1190 Opioid use, unspecified, uncomplicated: Secondary | ICD-10-CM

## 2020-08-19 MED ORDER — OXYCODONE HCL 10 MG PO TABS: 10.0000 mg | ORAL_TABLET | Freq: Four times a day (QID) | ORAL | 0 refills | Status: DC | PRN

## 2020-08-19 MED ORDER — PROMETHAZINE HCL 25 MG PO TABS: 25.0000 mg | ORAL_TABLET | Freq: Four times a day (QID) | ORAL | 0 refills | Status: DC | PRN

## 2020-08-19 NOTE — Telephone Encounter (Signed)
Done

## 2020-09-10 ENCOUNTER — Encounter: Payer: Self-pay | Admitting: Family Medicine

## 2020-09-11 ENCOUNTER — Telehealth: Payer: Self-pay | Admitting: Family Medicine

## 2020-09-11 NOTE — Telephone Encounter (Signed)
Pt request refill OXYCODONE HCI 10 MG TABS & pt states he has about one week supply of PROMETHAZINE. Pt uses  [facility] [address]  PT [facility] [phone]

## 2020-09-12 ENCOUNTER — Telehealth (INDEPENDENT_AMBULATORY_CARE_PROVIDER_SITE_OTHER): Payer: 59 | Admitting: Family Medicine

## 2020-09-12 ENCOUNTER — Encounter: Payer: Self-pay | Admitting: Family Medicine

## 2020-09-12 VITALS — Ht 67.0 in | Wt 190.0 lb

## 2020-09-12 DIAGNOSIS — D582 Other hemoglobinopathies: Secondary | ICD-10-CM

## 2020-09-12 DIAGNOSIS — R112 Nausea with vomiting, unspecified: Secondary | ICD-10-CM | POA: Diagnosis not present

## 2020-09-12 DIAGNOSIS — G894 Chronic pain syndrome: Secondary | ICD-10-CM | POA: Diagnosis not present

## 2020-09-12 DIAGNOSIS — F1190 Opioid use, unspecified, uncomplicated: Secondary | ICD-10-CM | POA: Diagnosis not present

## 2020-09-12 DIAGNOSIS — D571 Sickle-cell disease without crisis: Secondary | ICD-10-CM

## 2020-09-12 DIAGNOSIS — M79672 Pain in left foot: Secondary | ICD-10-CM

## 2020-09-12 DIAGNOSIS — Z09 Encounter for follow-up examination after completed treatment for conditions other than malignant neoplasm: Secondary | ICD-10-CM | POA: Diagnosis not present

## 2020-09-12 MED ORDER — OXYCODONE HCL 10 MG PO TABS: 10.0000 mg | ORAL_TABLET | Freq: Four times a day (QID) | ORAL | 0 refills | Status: DC | PRN

## 2020-09-12 MED ORDER — IBUPROFEN 800 MG PO TABS: 800.0000 mg | ORAL_TABLET | Freq: Three times a day (TID) | ORAL | 3 refills | Status: DC | PRN

## 2020-09-12 MED ORDER — PROMETHAZINE HCL 25 MG PO TABS: 25.0000 mg | ORAL_TABLET | Freq: Four times a day (QID) | ORAL | 0 refills | Status: DC | PRN

## 2020-09-12 NOTE — Progress Notes (Signed)
Virtual Visit via Telephone Note  I connected with Austin [name] on 09/12/20 at  3:20 PM EST by telephone and verified that I am speaking with the correct person using two identifiers.  Location: Patient: Home Provider: Office   I discussed the limitations, risks, security and privacy concerns of performing an evaluation and management service by telephone and the availability of in person appointments. I also discussed with the patient that there may be a patient responsible charge related to this service. The patient expressed understanding and agreed to proceed.   History of Present Illness:  Patient Active Problem List   Diagnosis Date Noted  . Bilateral lower extremity pain 10/17/2019  . Sickle cell anemia without crisis (HCC) 09/20/2019  . Chronic pain syndrome 09/20/2019  . Nausea 09/20/2019  . Chronic, continuous use of opioids 09/20/2019  . Medication management 03/20/2019  . Bilious vomiting without nausea   . Viral gastroenteritis 01/23/2019  . Nausea vomiting and diarrhea   . Generalized abdominal pain   . Hb-SS disease with crisis (HCC)   . Dehydration   . Sickle cell pain crisis (HCC) 05/18/2018  . Acute sickle cell crisis (HCC) 12/15/2017  . Thrombocytopenia (HCC) 12/15/2017  . Splenomegaly 12/15/2017  . Sickle cell anemia with crisis (HCC) 12/15/2017  . 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 Spleen enlarged 08/18/2017    Current Status: Since his last office visit, he is doing well with no complaints. He states that he has chronic pain in his back and knees. He had left foot pain X 1 month, which he rates 10/10. He denies any trauma to foot. He rates his pain today at 0/10. He has not had a hospital visit for Sickle Cell Crisis since 05/18/2021  where he was treated and discharged the same day. [facility]. He is currently taking all medications as prescribed and staying well hydrated. He reports occasional  nausea, constipation, dizziness and headaches. He last took pain medications earlier today. He denies fevers, chills, fatigue, recent infections, weight loss, and night sweats. He has not had any visual changes, and falls. No chest pain, heart palpitations, cough and shortness of breath reported. Denies GI problems such as vomiting, and diarrhea. He has no reports of blood in stools, dysuria and hematuria. No depression or anxiety reported today. He is taking all medications as prescribed. 20 denies pain today.     Observations/Objective:  Telephone Virtual Visit   Assessment and Plan:  1. Sickle cell anemia without crisis [facility]) He is doing well today r/t his chronic pain management. He will continue to take pain medications as prescribed; will continue to avoid extreme heat and cold; will continue to eat a healthy diet and drink at least 64 ounces of water daily; continue stool softener as needed; will avoid colds and flu; will continue to get plenty of sleep and rest; will continue to avoid high stressful situations and remain infection free; will continue Folic Acid 1 mg daily to avoid sickle cell crisis. Continue to follow up with Hematologist as needed.   2. Hemoglobin C-C disease (HCC) - Oxycodone HCl 10 MG TABS; Take 1 tablet (10 mg total) by mouth 4 (four) times daily as needed.  Dispense: 60 tablet; Refill: 0  3. Chronic pain syndrome - Oxycodone HCl 10 MG TABS; Take 1 tablet (10 mg total) by mouth 4 (four) times daily as needed.  Dispense: 60 tablet; Refill: 0  4. Chronic, continuous use of opioids -  Oxycodone HCl 10 MG TABS; Take 1 tablet (10 mg total) by mouth 4 (four) times daily as needed.  Dispense: 60 tablet; Refill: 0  5. Acute foot pain, left - ibuprofen (ADVIL) 800 MG tablet; Take 1 tablet (800 mg total) by mouth every 8 (eight) hours as needed.  Dispense: 30 tablet; Refill: 3 - DG Foot Complete Left; Future  6. Nausea and vomiting, intractability of vomiting not  specified, unspecified vomiting type - promethazine (PHENERGAN) 25 MG tablet; Take 1 tablet (25 mg total) by mouth every 6 (six) hours as needed for nausea or vomiting.  Dispense: 30 tablet; Refill: 0  7. Follow up He will keep scheduled follow up appointment.   Meds ordered this encounter  Medications  . ibuprofen (ADVIL) 800 MG tablet    Sig: Take 1 tablet (800 mg total) by mouth every 8 (eight) hours as needed.    Dispense:  30 tablet    Refill:  3  . Oxycodone HCl 10 MG TABS    Sig: Take 1 tablet (10 mg total) by mouth 4 (four) times daily as needed.    Dispense:  60 tablet    Refill:  0    Order Specific Question:   Supervising Provider    Answer:   Quentin Angst [id number]  . promethazine (PHENERGAN) 25 MG tablet    Sig: Take 1 tablet (25 mg total) by mouth every 6 (six) hours as needed for nausea or vomiting.    Dispense:  30 tablet    Refill:  0    Orders Placed This Encounter  Procedures  . DG Foot Complete Left    Referral Orders  No referral(s) requested today    [name], MSN, ANE, FNP-BC [facility] [address] [place], [place] [address] [phone]    I discussed the assessment and treatment plan with the patient. The patient was provided an opportunity to ask questions and all were answered. The patient agreed with the plan and demonstrated an understanding of the instructions.   The patient was advised to call back or seek an in-person evaluation if the symptoms worsen or if the condition fails to improve as anticipated.  I provided 20 minutes of non-face-to-face time during this encounter.   Kallie Locks, FNP

## 2020-09-13 ENCOUNTER — Encounter (HOSPITAL_COMMUNITY): Payer: Self-pay | Admitting: Emergency Medicine

## 2020-09-13 ENCOUNTER — Emergency Department (HOSPITAL_COMMUNITY)
Admission: EM | Admit: 2020-09-13 | Discharge: 2020-09-13 | Disposition: A | Discharge status: Home / Self Care | Payer: 59 | Attending: Emergency Medicine | Admitting: Emergency Medicine

## 2020-09-13 ENCOUNTER — Emergency Department (HOSPITAL_COMMUNITY): Payer: 59

## 2020-09-13 ENCOUNTER — Other Ambulatory Visit: Payer: Self-pay

## 2020-09-13 DIAGNOSIS — Z87891 Personal history of nicotine dependence: Secondary | ICD-10-CM | POA: Insufficient documentation

## 2020-09-13 DIAGNOSIS — S99922A Unspecified injury of left foot, initial encounter: Secondary | ICD-10-CM | POA: Diagnosis present

## 2020-09-13 DIAGNOSIS — S9032XA Contusion of left foot, initial encounter: Secondary | ICD-10-CM | POA: Insufficient documentation

## 2020-09-13 DIAGNOSIS — X58XXXA Exposure to other specified factors, initial encounter: Secondary | ICD-10-CM | POA: Diagnosis not present

## 2020-09-13 NOTE — ED Triage Notes (Signed)
Patient complaining of Left foot pain. Patient states he does not know how he hurt his foot. Patient states is a bruise on the bottom of his foot.

## 2020-09-13 NOTE — ED Provider Notes (Signed)
[facility]-EMERGENCY DEPT Provider Note   CSN: [id number] Arrival date & time: 09/13/20  5170     History Chief Complaint  Patient presents with  . Foot Injury    Austin [name] is a 30 y.o. male who presents to ED with a chief complaint of left foot pain.  States that for the past month he has had progressively worsening pain and he noticed a bruise to the bottom of his foot.  States that he does walk a lot for work but does not recall a specific incident or injury that would have caused the symptoms.  He is contacted his PCP yesterday and was prescribed 800 mg of ibuprofen which he did not pick up today because the pharmacy was not open.  He decided to come to the ER for an x-ray.  He has not tried any other medications to help with his symptoms.  No fevers, drainage from the area, lacerations or wounds, prior fracture, dislocations or procedures in the area.  HPI     Past Medical History:  Diagnosis Date  . Acute maxillary sinusitis   . Eczema   . Sickle cell anemia (HCC) .    Patient Active Problem List   Diagnosis Date Noted  . Bilateral lower extremity pain 10/17/2019  . Sickle cell anemia without crisis (HCC) 09/20/2019  . Chronic pain syndrome 09/20/2019  . Nausea 09/20/2019  . Chronic, continuous use of opioids 09/20/2019  . Medication management 03/20/2019  . Bilious vomiting without nausea   . Viral gastroenteritis 01/23/2019  . Nausea vomiting and diarrhea   . Generalized abdominal pain   . Hb-SS disease with crisis (HCC)   . Dehydration   . Sickle cell pain crisis (HCC) 05/18/2018  . Acute sickle cell crisis (HCC) 12/15/2017  . Thrombocytopenia (HCC) 12/15/2017  . Splenomegaly 12/15/2017  . Sickle cell anemia with crisis (HCC) 12/15/2017  . 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 Spleen enlarged 08/18/2017    Past Surgical History:  Procedure Laterality Date  . WISDOM TOOTH EXTRACTION         Family History  Problem  Relation Age of Onset  . Sickle cell trait Mother   . Diabetes Father   . Hypertension Father     Social History   Tobacco Use  . Smoking status: Former Smoker    Packs/day: 0.10  . Smokeless tobacco: Never Used  Vaping Use  . Vaping Use: Never used  Substance Use Topics  . Alcohol use: Yes    Comment: occ  . Drug use: Yes    Types: Marijuana    Home Medications Prior to Admission medications   Medication Sig Start Date End Date Taking? Authorizing Provider  ibuprofen (ADVIL) 800 MG tablet Take 1 tablet (800 mg total) by mouth every 8 (eight) hours as needed. 09/12/20   Kallie Locks, FNP  loratadine (CLARITIN) 10 MG tablet Take 1 tablet (10 mg total) by mouth daily. 10/03/18   Kallie Locks, FNP  ondansetron (ZOFRAN) 4 MG tablet Take 1 tablet (4 mg total) by mouth every 8 (eight) hours as needed for nausea or vomiting. 07/09/19   Kallie Locks, FNP  Oxycodone HCl 10 MG TABS Take 1 tablet (10 mg total) by mouth 4 (four) times daily as needed. 09/12/20   Kallie Locks, FNP  promethazine (PHENERGAN) 25 MG tablet Take 1 tablet (25 mg total) by mouth every 6 (six) hours as needed for nausea or vomiting. 09/12/20   Bradly Chris,  Rolm Gala, FNP    Allergies    Patient has no known allergies.  Review of Systems   Review of Systems  Constitutional: Negative for fever.  Musculoskeletal: Positive for myalgias.  Neurological: Negative for weakness and numbness.    Physical Exam Updated Vital Signs BP 140/80 (BP Location: Left Arm)   Pulse 92   Temp 98.1 F (36.7 C) (Oral)   Resp 17   Ht 5\' 7"  (1.702 m)   Wt 90.7 kg   SpO2 99%   BMI 31.32 kg/m   Physical Exam Vitals and nursing note reviewed.  Constitutional:      General: He is not in acute distress.    Appearance: He is well-developed and well-nourished. He is not diaphoretic.  HENT:     Head: Normocephalic and atraumatic.  Eyes:     General: No scleral icterus.    Extraocular Movements: EOM normal.      Conjunctiva/sclera: Conjunctivae normal.  Pulmonary:     Effort: Pulmonary effort is normal. No respiratory distress.  Musculoskeletal:        General: Tenderness present.     Cervical back: Normal range of motion.       Feet:  Feet:     Comments: Tenderness to palpation in the indicated area with a small bruise noted.  No lacerations, open wounds noted.  2+ DP pulse noted bilaterally.  Normal sensation to light touch of feet.  Normal range of motion of bilateral ankles and digits. Skin:    Findings: No rash.  Neurological:     Mental Status: He is alert.  Psychiatric:        Mood and Affect: Mood and affect normal.     ED Results / Procedures / Treatments   Labs (all labs ordered are listed, but only abnormal results are displayed) Labs Reviewed - No data to display  EKG None  Radiology DG Foot Complete Left  Result Date: 09/13/2020 CLINICAL DATA:  Pain with bruising in the mid plantar surface of the left foot. EXAM: LEFT FOOT - COMPLETE 3+ VIEW COMPARISON:  None. FINDINGS: There is no evidence of fracture or dislocation. There is no evidence of arthropathy or other focal bone abnormality. Soft tissues are unremarkable. IMPRESSION: Negative. Electronically Signed   By: 09/15/2020 III M.D   On: 09/13/2020 09:53    Procedures Procedures   Medications Ordered in ED Medications - No data to display  ED Course  I have reviewed the triage vital signs and the nursing notes.  Pertinent labs & imaging results that were available during my care of the patient were reviewed by me and considered in my medical decision making (see chart for details).    MDM Rules/Calculators/A&P                          30 year old male presenting to the ED with a chief complaint of left foot pain.  States that the bottom of his foot has gradually been getting more painful and he noticed a bruise in the area.  Symptoms have been going on for about 1 month.  No specific trigger or injury.  He  contacted his PCP yesterday and she sent in a prescription for ibuprofen which he tried to get filled this morning.  The pharmacy was closed so he came to the ER.  He states that he is on his feet a lot for work.  On exam there is tenderness to the bottom of  his foot with a bruise noted in the area.  Areas are vastly intact.  No open or draining wounds noted.  Normal sensation to light touch.  Normal range of motion of digits and ankle of bilateral feet.  X-ray without any abnormalities.  Suspect that symptoms could be due to contusion or from overuse.  He remains ambulatory.  Doubt infectious or vascular cause of symptoms.  We will have him take ibuprofen as prescribed by PCP and referred to specialist.  Return precautions given.  All imaging, if done today, including plain films, CT scans, and ultrasounds, independently reviewed by me, and interpretations confirmed via formal radiology reads.  Patient is hemodynamically stable, in NAD, and able to ambulate in the ED. Evaluation does not show pathology that would require ongoing emergent intervention or inpatient treatment. I explained the diagnosis to the patient. Pain has been managed and has no complaints prior to discharge. Patient is comfortable with above plan and is stable for discharge at this time. All questions were answered prior to disposition. Strict return precautions for returning to the ED were discussed. Encouraged follow up with PCP.   An After Visit Summary was printed and given to the patient.   Portions of this note were generated with Scientist, clinical (histocompatibility and immunogenetics). Dictation errors may occur despite best attempts at proofreading.  Final Clinical Impression(s) / ED Diagnoses Final diagnoses:  Contusion of left foot, initial encounter    Rx / DC Orders ED Discharge Orders    None       Dietrich Pates, PA-C 09/13/20 1013    Tegeler, Canary Brim, MD 09/13/20 [phone]

## 2020-09-13 NOTE — Discharge Instructions (Signed)
Take the medication as prescribed by your primary care provider. Follow-up with the podiatrist listed below. You can apply ice to the area as needed. Return to the ER if you start to experience worsening pain, injuries, wound to the area or drainage.

## 2020-09-13 NOTE — ED Notes (Signed)
Pt in room , states pain in foot has been ongoing. Gave Pt ice pack.

## 2020-09-15 ENCOUNTER — Ambulatory Visit (INDEPENDENT_AMBULATORY_CARE_PROVIDER_SITE_OTHER): Payer: 59 | Admitting: Family Medicine

## 2020-09-15 ENCOUNTER — Other Ambulatory Visit: Payer: Self-pay

## 2020-09-15 ENCOUNTER — Encounter: Payer: Self-pay | Admitting: Family Medicine

## 2020-09-15 VITALS — BP 126/82 | HR 84 | Temp 97.0°F | Resp 16 | Ht 67.0 in | Wt 191.0 lb

## 2020-09-15 DIAGNOSIS — M79672 Pain in left foot: Secondary | ICD-10-CM

## 2020-09-15 DIAGNOSIS — Z09 Encounter for follow-up examination after completed treatment for conditions other than malignant neoplasm: Secondary | ICD-10-CM | POA: Diagnosis not present

## 2020-09-15 DIAGNOSIS — D571 Sickle-cell disease without crisis: Secondary | ICD-10-CM

## 2020-09-15 DIAGNOSIS — G894 Chronic pain syndrome: Secondary | ICD-10-CM

## 2020-09-15 NOTE — Patient Instructions (Signed)
Plantar Fasciitis  Plantar fasciitis is a painful foot condition that affects the heel. It occurs when the band of tissue that connects the toes to the heel bone (plantar fascia) becomes irritated. This can happen as the result of exercising too much or doing other repetitive activities (overuse injury). Plantar fasciitis can cause mild irritation to severe pain that makes it difficult to walk or move. The pain is usually worse in the morning after sleeping, or after sitting or lying down for a period of time. Pain may also be worse after long periods of walking or standing. What are the causes? This condition may be caused by:  Standing for long periods of time.  Wearing shoes that do not have good arch support.  Doing activities that put stress on joints (high-impact activities). This includes ballet and exercise that makes your heart beat faster (aerobic exercise), such as running.  Being overweight.  An abnormal way of walking (gait).  Tight muscles in the back of your lower leg (calf).  High arches in your feet or flat feet.  Starting a new athletic activity. What are the signs or symptoms? The main symptom of this condition is heel pain. Pain may get worse after the following:  Taking the first steps after a time of rest, especially in the morning after awakening, or after you have been sitting or lying down for a while.  Long periods of standing still. Pain may decrease after 30-45 minutes of activity, such as gentle walking. How is this diagnosed? This condition may be diagnosed based on your medical history, a physical exam, and your symptoms. Your health care provider will check for:  A tender area on the bottom of your foot.  A high arch in your foot or flat feet.  Pain when you move your foot.  Difficulty moving your foot. You may have imaging tests to confirm the diagnosis, such as:  X-rays.  Ultrasound.  MRI. How is this treated? Treatment for plantar  fasciitis depends on how severe your condition is. Treatment may include:  Rest, ice, pressure (compression), and raising (elevating) the affected foot. This is called RICE therapy. Your health care provider may recommend RICE therapy along with over-the-counter pain medicines to manage your pain.  Exercises to stretch your calves and your plantar fascia.  A splint that holds your foot in a stretched, upward position while you sleep (night splint).  Physical therapy to relieve symptoms and prevent problems in the future.  Injections of steroid medicine (cortisone) to relieve pain and inflammation.  Stimulating your plantar fascia with electrical impulses (extracorporeal shock wave therapy). This is usually the last treatment option before surgery.  Surgery, if other treatments have not worked after 12 months. Follow these instructions at home: Managing pain, stiffness, and swelling  If directed, put ice on the painful area. To do this: ? Put ice in a plastic bag, or use a frozen bottle of water. ? Place a towel between your skin and the bag or bottle. ? Roll the bottom of your foot over the bag or bottle. ? Do this for 20 minutes, 2-3 times a day.  Wear athletic shoes that have air-sole or gel-sole cushions, or try soft shoe inserts that are designed for plantar fasciitis.  Elevate your foot above the level of your heart while you are sitting or lying down.   Activity  Avoid activities that cause pain. Ask your health care provider what activities are safe for you.  Do physical therapy exercises   and stretches as told by your health care provider.  Try activities and forms of exercise that are easier on your joints (low impact). Examples include swimming, water aerobics, and biking. General instructions  Take over-the-counter and prescription medicines only as told by your health care provider.  Wear a night splint while sleeping, if told by your health care provider. Loosen the  splint if your toes tingle, become numb, or turn cold and blue.  Maintain a healthy weight, or work with your health care provider to lose weight as needed.  Keep all follow-up visits. This is important. Contact a health care provider if you have:  Symptoms that do not go away with home treatment.  Pain that gets worse.  Pain that affects your ability to move or do daily activities. Summary  Plantar fasciitis is a painful foot condition that affects the heel. It occurs when the band of tissue that connects the toes to the heel bone (plantar fascia) becomes irritated.  Heel pain is the main symptom of this condition. It may get worse after exercising too much or standing still for a long time.  Treatment varies, but it usually starts with rest, ice, pressure (compression), and raising (elevating) the affected foot. This is called RICE therapy. Over-the-counter medicines can also be used to manage pain. This information is not intended to replace advice given to you by your health care provider. Make sure you discuss any questions you have with your health care provider. Document Revised: 10/29/2019 Document Reviewed: 10/29/2019 Elsevier Patient Education  2021 [employer]  

## 2020-09-15 NOTE — Progress Notes (Signed)
[facility]   Sick Visit  Subjective:  Patient ID: Austin [name], male    DOB: 01-17-1991  Age: 30 y.o. MRN: [id number]  CC:  Chief Complaint  Patient presents with  . Foot Pain    HPI Austin [name] is a 30 year old male who presents for Sick Visit today.   Patient Active Problem List   Diagnosis Date Noted  . Bilateral lower extremity pain 10/17/2019  . Sickle cell anemia without crisis (HCC) 09/20/2019  . Chronic pain syndrome 09/20/2019  . Nausea 09/20/2019  . Chronic, continuous use of opioids 09/20/2019  . Medication management 03/20/2019  . Bilious vomiting without nausea   . Viral gastroenteritis 01/23/2019  . Nausea vomiting and diarrhea   . Generalized abdominal pain   . Hb-SS disease with crisis (HCC)   . Dehydration   . Sickle cell pain crisis (HCC) 05/18/2018  . Acute sickle cell crisis (HCC) 12/15/2017  . Thrombocytopenia (HCC) 12/15/2017  . Splenomegaly 12/15/2017  . Sickle cell anemia with crisis (HCC) 12/15/2017  . 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 Spleen enlarged 08/18/2017   Current Status: Since his last office visit, he has c/o left plantar foot pain, bruises, and swelling X about 30 days now, which is progressively getting worse. He is currently taking Motrin for minimal relief of foot pain. He does have extensive standing on his job. He denies injury to foot.   Past Medical History:  Diagnosis Date  . Acute maxillary sinusitis   . Eczema   . Sickle cell anemia (HCC) .    Past Surgical History:  Procedure Laterality Date  . WISDOM TOOTH EXTRACTION      Family History  Problem Relation Age of Onset  . Sickle cell trait Mother   . Diabetes Father   . Hypertension Father     Social History   Socioeconomic History  . Marital status: Married    Spouse name: Not on file  . Number of children: Not on file  . Years of education: Not on file  . Highest education level:  Not on file  Occupational History  . Not on file  Tobacco Use  . Smoking status: Former Smoker    Packs/day: 0.10  . Smokeless tobacco: Never Used  Vaping Use  . Vaping Use: Never used  Substance and Sexual Activity  . Alcohol use: Yes    Comment: occ  . Drug use: Yes    Types: Marijuana  . Sexual activity: Yes  Other Topics Concern  . Not on file  Social History Narrative  . Not on file   Social Determinants of Health   Financial Resource Strain: Not on file  Food Insecurity: Not on file  Transportation Needs: Not on file  Physical Activity: Not on file  Stress: Not on file  Social Connections: Not on file  Intimate Partner Violence: Not on file    Outpatient Medications Prior to Visit  Medication Sig Dispense Refill  . ibuprofen (ADVIL) 800 MG tablet Take 1 tablet (800 mg total) by mouth every 8 (eight) hours as needed. 30 tablet 3  . loratadine (CLARITIN) 10 MG tablet Take 1 tablet (10 mg total) by mouth daily. 30 tablet 6  . ondansetron (ZOFRAN) 4 MG tablet Take 1 tablet (4 mg total) by mouth every 8 (eight) hours as needed for nausea or vomiting. 20 tablet 3  . Oxycodone HCl 10 MG TABS Take 1 tablet (10 mg total)  by mouth 4 (four) times daily as needed. 60 tablet 0  . promethazine (PHENERGAN) 25 MG tablet Take 1 tablet (25 mg total) by mouth every 6 (six) hours as needed for nausea or vomiting. 30 tablet 0   No facility-administered medications prior to visit.    No Known Allergies  ROS Review of Systems  Constitutional: Negative.   Respiratory: Negative.   Cardiovascular: Negative.   Musculoskeletal: Positive for arthralgias (generalized joint pain r/t sickle cell disease).       Left foot pain  Psychiatric/Behavioral: Negative.    Objective:    Physical Exam Vitals and nursing note reviewed.  Constitutional:      Appearance: Normal appearance.  Cardiovascular:     Rate and Rhythm: Normal rate and regular rhythm.     Pulses: Normal pulses.     Heart  sounds: Normal heart sounds.  Pulmonary:     Effort: Pulmonary effort is normal.     Breath sounds: Normal breath sounds.  Skin:    Comments: Left plantar foot lesion  Neurological:     Mental Status: He is alert.  Psychiatric:        Mood and Affect: Mood normal.        Behavior: Behavior normal.        Thought Content: Thought content normal.        Judgment: Judgment normal.     BP 126/82   Pulse 84   Temp (!) 97 F (36.1 C)   Resp 16   Wt 191 lb (86.6 kg)   SpO2 99%   BMI 29.91 kg/m  Wt Readings from Last 3 Encounters:  09/15/20 191 lb (86.6 kg)  09/13/20 200 lb (90.7 kg)  09/12/20 190 lb (86.2 kg)     Health Maintenance Due  Topic Date Due  . Hepatitis C Screening  Never done    There are no preventive care reminders to display for this patient.  Lab Results  Component Value Date   TSH 0.662 08/12/2017   Lab Results  Component Value Date   WBC 8.9 05/18/2020   HGB 12.8 (L) 05/18/2020   HCT 35.8 (L) 05/18/2020   MCV 80.4 05/18/2020   PLT 117 (L) 05/18/2020   Lab Results  Component Value Date   NA 139 05/18/2020   K 4.3 05/18/2020   CO2 27 05/18/2020   GLUCOSE 93 05/18/2020   BUN 9 05/18/2020   CREATININE 0.84 05/18/2020   BILITOT 1.7 (H) 05/18/2020   ALKPHOS 54 05/18/2020   AST 15 05/18/2020   ALT 39 05/18/2020   PROT 7.6 05/18/2020   ALBUMIN 4.4 05/18/2020   CALCIUM 9.5 05/18/2020   ANIONGAP 9 05/18/2020   No results found for: CHOL No results found for: HDL No results found for: LDLCALC No results found for: TRIG No results found for: [facility] Lab Results  Component Value Date   HGBA1C 4.2 08/12/2017    Assessment & Plan:   1. Acute foot pain, left AVS with treatment for plantar fascitis printed at discharge.   2. Sickle cell anemia without crisis [facility]) He is doing well today r/t his chronic pain management. He will continue to take pain medications as prescribed; will continue to avoid extreme heat and cold; will continue to  eat a healthy diet and drink at least 64 ounces of water daily; continue stool softener as needed; will avoid colds and flu; will continue to get plenty of sleep and rest; will continue to avoid high stressful situations and remain  infection free; will continue Folic Acid 1 mg daily to avoid sickle cell crisis. Continue to follow up with Hematologist as needed.   3. Chronic pain syndrome  4. Follow up He will keep follow up appointment as prescribed.   No orders of the defined types were placed in this encounter.   No orders of the defined types were placed in this encounter.   Referral Orders  No referral(s) requested today    [name], MSN, ANE, FNP-BC [facility] [address] [place], [place] [address] [phone] fax  Problem List Items Addressed This Visit      Other   Chronic pain syndrome   Sickle cell anemia without crisis (HCC)    Other Visit Diagnoses    Acute foot pain, left    -  Primary   Follow up          No orders of the defined types were placed in this encounter.   Follow-up: No follow-ups on file.    Kallie Locks, FNP

## 2020-09-15 NOTE — Progress Notes (Signed)
F/u left foot pain, HFU-  Has a bruise where arch is Onset with pain x 1 month Pain 6/10  OTC medication- Ibuprofen 800 mg

## 2020-09-23 IMAGING — US ULTRASOUND ABDOMEN LIMITED
1 series · 14 of 25 positions shown · non-contrast
Comparison: None.

CLINICAL DATA: Bilious emesis.  Vomiting.  Hepatitis C.

EXAM:
ULTRASOUND ABDOMEN LIMITED RIGHT UPPER QUADRANT

[Series 1: ultrasound abdomen limited · 14 of 36 slices shown]
[im 1/36]
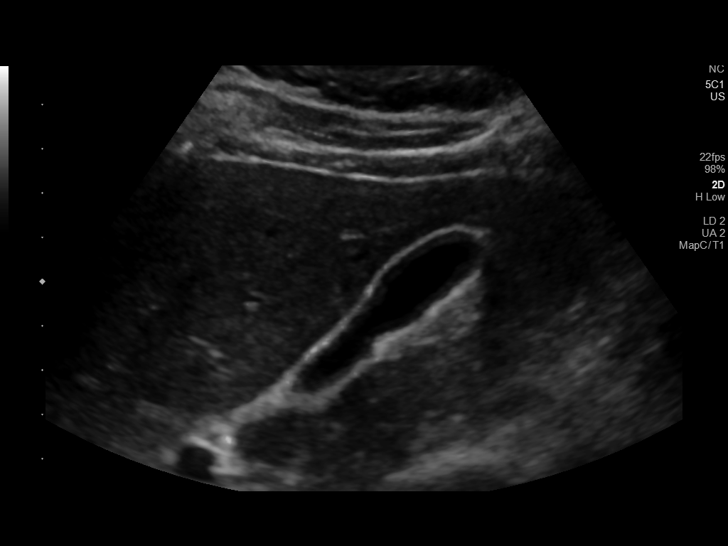
[im 3/36]
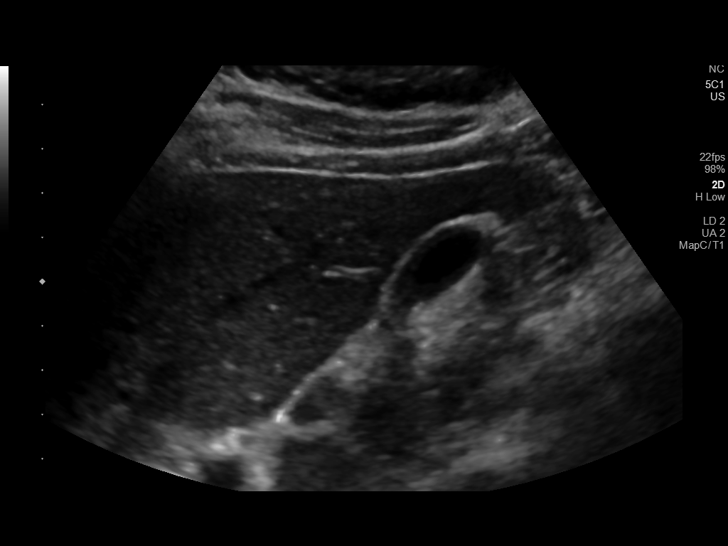
[im 6/36]
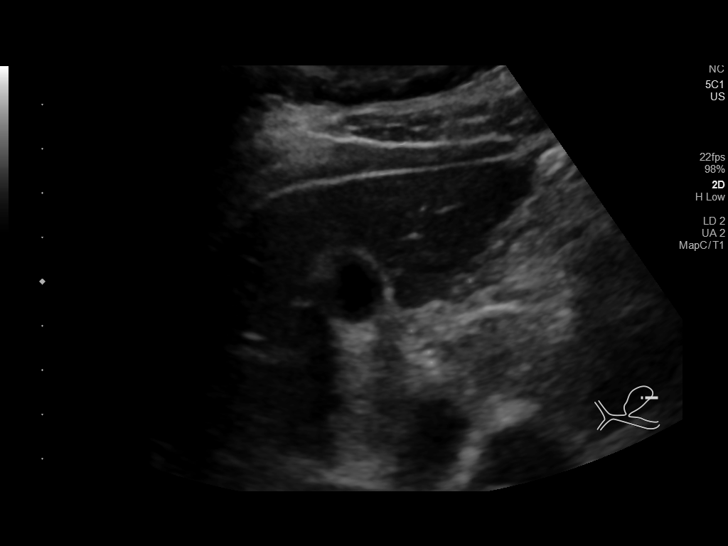
[im 9/36]
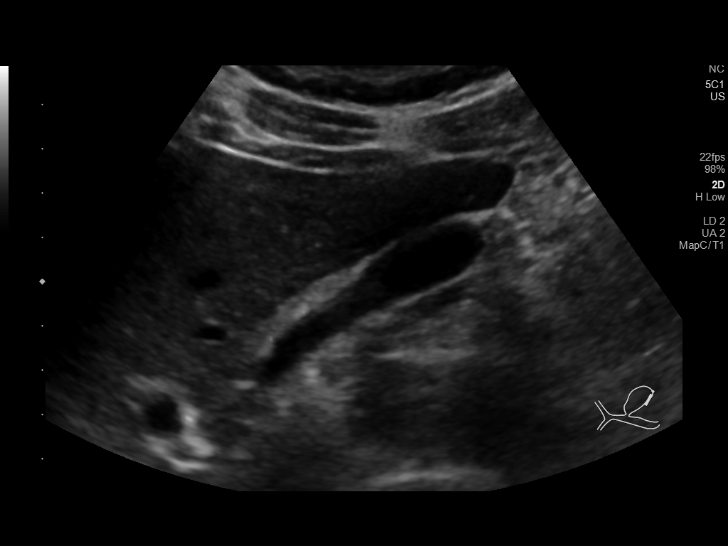
[im 12/36]
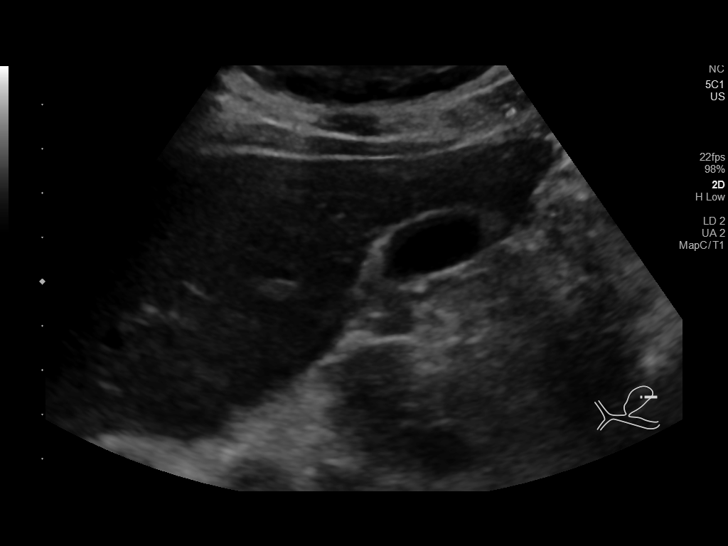
[im 14/36]
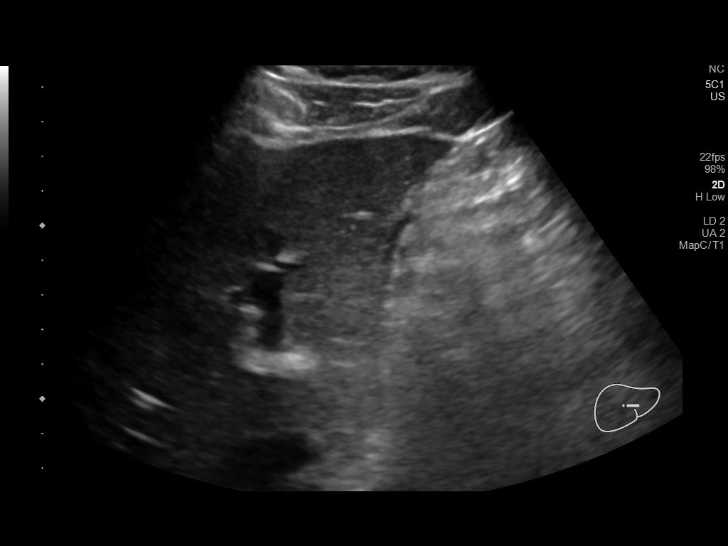
[im 17/36]
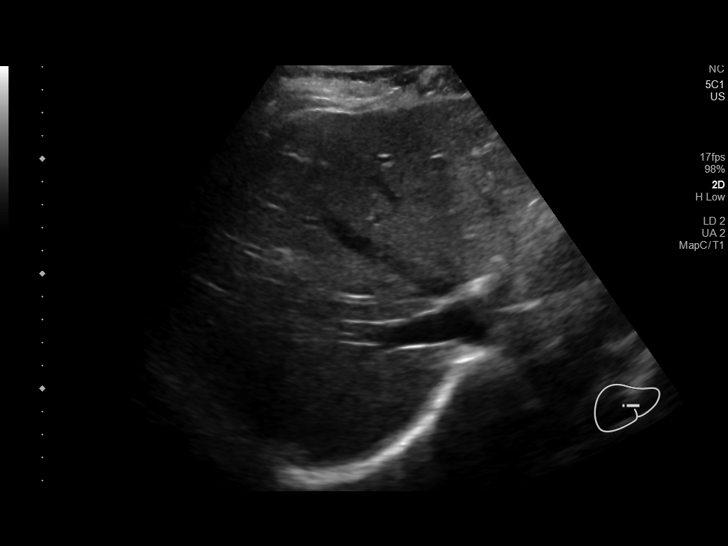
[im 19/36]
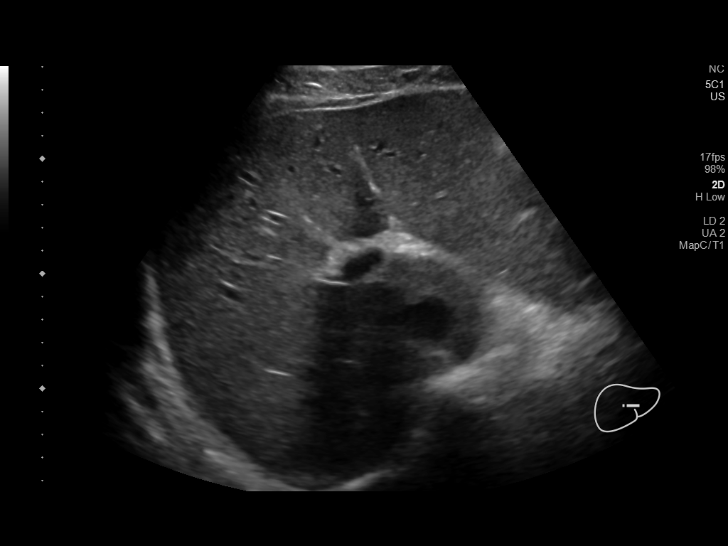
[im 22/36]
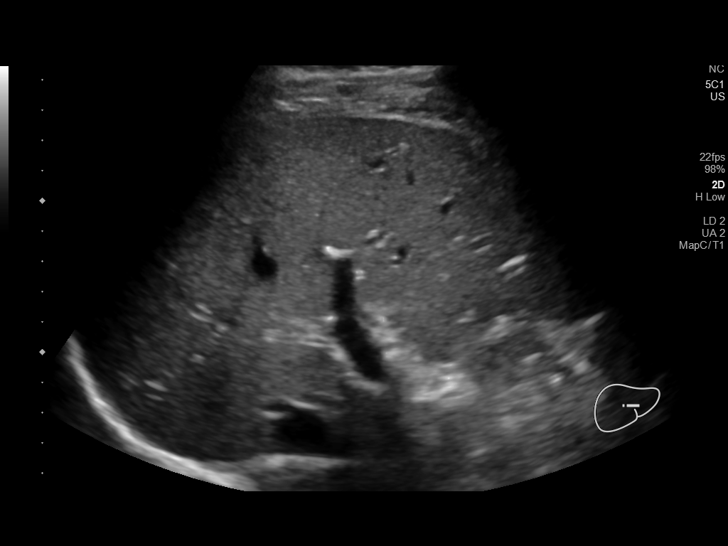
[im 24/36]
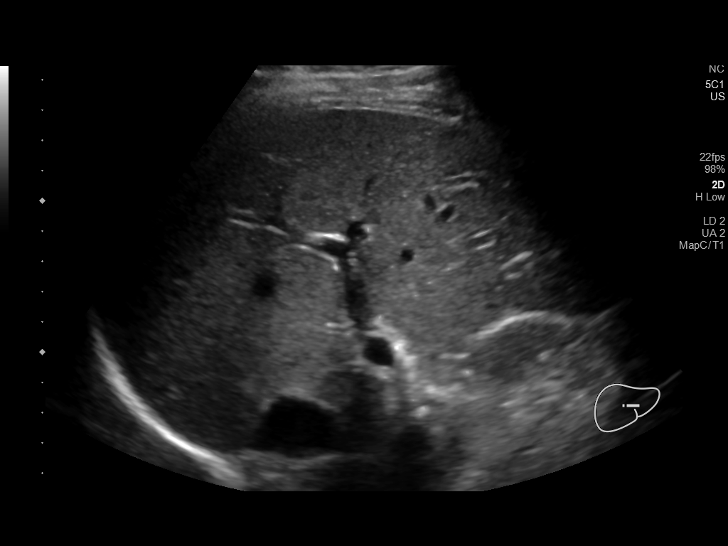
[im 27/36]
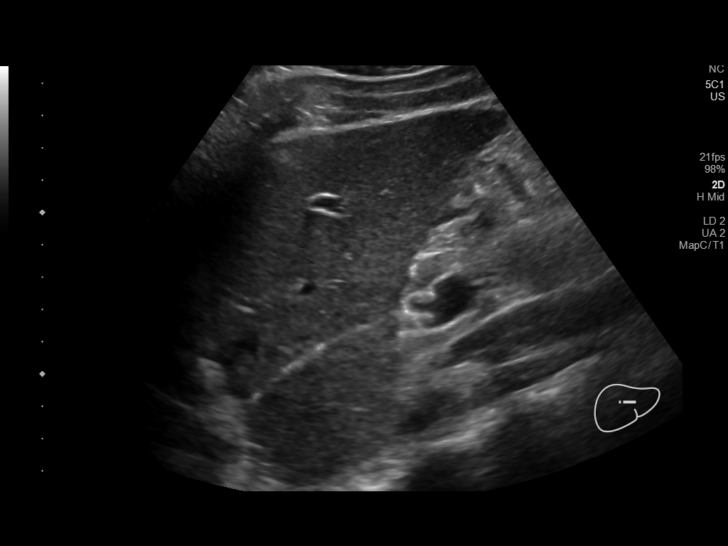
[im 30/36]
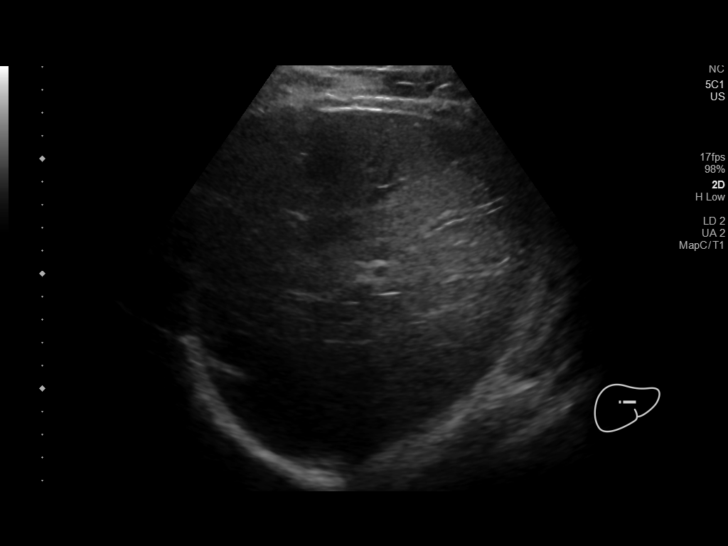
[im 33/36]
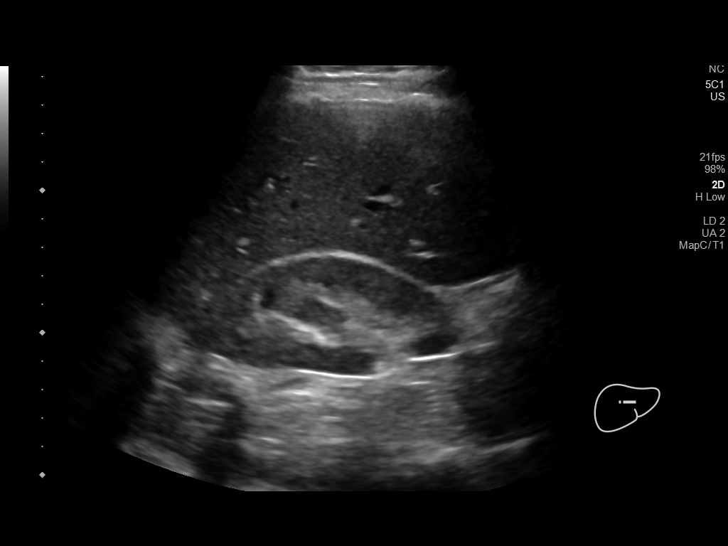
[im 36/36]
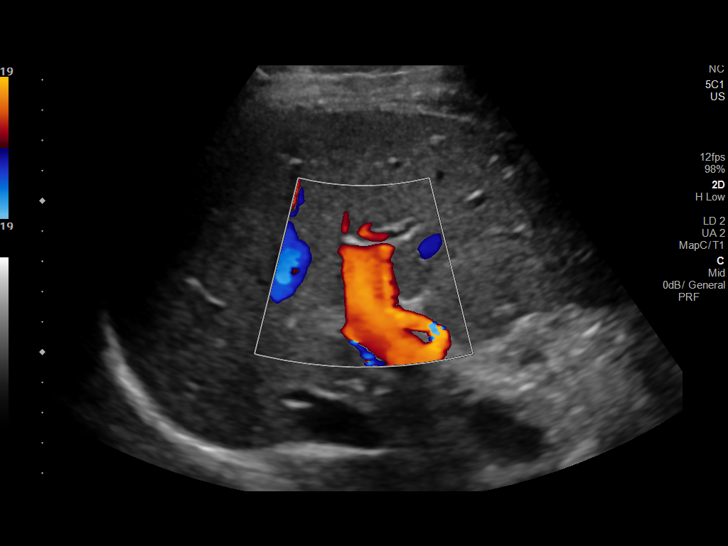

[14 of 25 positions shown; findings below may reference images not displayed]

FINDINGS: Gallbladder:

Gallbladder is poorly distended limiting evaluation with no obvious
abnormalities. No stones, wall thickening, pericholecystic fluid, or
Murphy's sign.

Common bile duct:

Not visualized.

Liver:

No focal lesion identified. Within normal limits in parenchymal
echogenicity. Portal vein is patent on color Doppler imaging with
normal direction of blood flow towards the liver.
IMPRESSION: 1. The study is limited due to patient condition and inability to
lie still. The common bile duct is not visualized. The gallbladder
and liver are normal.

## 2020-09-29 ENCOUNTER — Ambulatory Visit: Payer: 59 | Admitting: Family Medicine

## 2020-10-03 ENCOUNTER — Telehealth: Payer: Self-pay

## 2020-10-03 ENCOUNTER — Encounter: Payer: Self-pay | Admitting: Family Medicine

## 2020-10-03 NOTE — Telephone Encounter (Signed)
Left message for patient to call back regarding his concerns.

## 2020-10-06 ENCOUNTER — Other Ambulatory Visit: Payer: Self-pay | Admitting: Family Medicine

## 2020-10-06 DIAGNOSIS — D582 Other hemoglobinopathies: Secondary | ICD-10-CM

## 2020-10-06 DIAGNOSIS — F1190 Opioid use, unspecified, uncomplicated: Secondary | ICD-10-CM

## 2020-10-06 DIAGNOSIS — G894 Chronic pain syndrome: Secondary | ICD-10-CM

## 2020-10-06 DIAGNOSIS — R112 Nausea with vomiting, unspecified: Secondary | ICD-10-CM

## 2020-10-06 MED ORDER — PROMETHAZINE HCL 25 MG PO TABS: 25.0000 mg | ORAL_TABLET | Freq: Four times a day (QID) | ORAL | 0 refills | Status: DC | PRN

## 2020-10-06 MED ORDER — OXYCODONE HCL 10 MG PO TABS: 10.0000 mg | ORAL_TABLET | Freq: Four times a day (QID) | ORAL | 0 refills | Status: DC | PRN

## 2020-10-09 DIAGNOSIS — M71572 Other bursitis, not elsewhere classified, left ankle and foot: Secondary | ICD-10-CM | POA: Diagnosis not present

## 2020-10-09 DIAGNOSIS — M19072 Primary osteoarthritis, left ankle and foot: Secondary | ICD-10-CM | POA: Diagnosis not present

## 2020-10-09 DIAGNOSIS — M722 Plantar fascial fibromatosis: Secondary | ICD-10-CM | POA: Diagnosis not present

## 2020-10-16 DIAGNOSIS — M722 Plantar fascial fibromatosis: Secondary | ICD-10-CM | POA: Diagnosis not present

## 2020-10-16 DIAGNOSIS — M71572 Other bursitis, not elsewhere classified, left ankle and foot: Secondary | ICD-10-CM | POA: Diagnosis not present

## 2020-10-22 ENCOUNTER — Other Ambulatory Visit: Payer: Self-pay | Admitting: Family Medicine

## 2020-10-22 ENCOUNTER — Telehealth: Payer: Self-pay | Admitting: Family Medicine

## 2020-10-22 DIAGNOSIS — G894 Chronic pain syndrome: Secondary | ICD-10-CM

## 2020-10-22 DIAGNOSIS — D582 Other hemoglobinopathies: Secondary | ICD-10-CM

## 2020-10-22 DIAGNOSIS — F1190 Opioid use, unspecified, uncomplicated: Secondary | ICD-10-CM

## 2020-10-22 MED ORDER — OXYCODONE HCL 10 MG PO TABS: 10.0000 mg | ORAL_TABLET | Freq: Four times a day (QID) | ORAL | 0 refills | Status: DC | PRN

## 2020-10-23 DIAGNOSIS — M71572 Other bursitis, not elsewhere classified, left ankle and foot: Secondary | ICD-10-CM | POA: Diagnosis not present

## 2020-10-23 DIAGNOSIS — M722 Plantar fascial fibromatosis: Secondary | ICD-10-CM | POA: Diagnosis not present

## 2020-10-23 NOTE — Telephone Encounter (Signed)
done

## 2020-11-13 ENCOUNTER — Other Ambulatory Visit: Payer: Self-pay

## 2020-11-13 ENCOUNTER — Ambulatory Visit: Payer: 59 | Admitting: Nurse Practitioner

## 2020-11-13 ENCOUNTER — Encounter: Payer: Self-pay | Admitting: Nurse Practitioner

## 2020-11-13 VITALS — BP 108/66 | HR 77 | Temp 98.2°F | Ht 67.0 in | Wt 189.4 lb

## 2020-11-13 DIAGNOSIS — F1190 Opioid use, unspecified, uncomplicated: Secondary | ICD-10-CM | POA: Diagnosis not present

## 2020-11-13 DIAGNOSIS — G894 Chronic pain syndrome: Secondary | ICD-10-CM | POA: Diagnosis not present

## 2020-11-13 DIAGNOSIS — D571 Sickle-cell disease without crisis: Secondary | ICD-10-CM

## 2020-11-13 DIAGNOSIS — D5700 Hb-SS disease with crisis, unspecified: Secondary | ICD-10-CM

## 2020-11-13 DIAGNOSIS — R112 Nausea with vomiting, unspecified: Secondary | ICD-10-CM | POA: Diagnosis not present

## 2020-11-13 DIAGNOSIS — J302 Other seasonal allergic rhinitis: Secondary | ICD-10-CM | POA: Diagnosis not present

## 2020-11-13 DIAGNOSIS — D582 Other hemoglobinopathies: Secondary | ICD-10-CM

## 2020-11-13 DIAGNOSIS — M79672 Pain in left foot: Secondary | ICD-10-CM

## 2020-11-13 DIAGNOSIS — M722 Plantar fascial fibromatosis: Secondary | ICD-10-CM | POA: Diagnosis not present

## 2020-11-13 MED ORDER — PROMETHAZINE HCL 25 MG PO TABS: 25.0000 mg | ORAL_TABLET | Freq: Four times a day (QID) | ORAL | 0 refills | Status: DC | PRN

## 2020-11-13 MED ORDER — LORATADINE 10 MG PO TABS: 10.0000 mg | ORAL_TABLET | Freq: Every day | ORAL | 6 refills | Status: DC

## 2020-11-13 MED ORDER — IBUPROFEN 800 MG PO TABS: 800.0000 mg | ORAL_TABLET | Freq: Three times a day (TID) | ORAL | 3 refills | Status: DC | PRN

## 2020-11-13 MED ORDER — OLOPATADINE HCL 0.2 % OP SOLN: 1.0000 [drp] | Freq: Two times a day (BID) | OPHTHALMIC | 11 refills | Status: AC

## 2020-11-13 MED ORDER — OXYCODONE HCL 10 MG PO TABS: 10.0000 mg | ORAL_TABLET | Freq: Four times a day (QID) | ORAL | 0 refills | Status: DC | PRN

## 2020-11-13 NOTE — Patient Instructions (Addendum)
Sickle Cell Anemia, Adult  Sickle cell anemia is a condition where your red blood cells are shaped like sickles. Red blood cells carry oxygen through the body. Sickle-shaped cells do not live as long as normal red blood cells. They also clump together and block blood from flowing through the blood vessels. This prevents the body from getting enough oxygen. Sickle cell anemia causes organ damage and pain. It also increases the risk of infection. Follow these instructions at home: Medicines  Take over-the-counter and prescription medicines only as told by your doctor.  If you were prescribed an antibiotic medicine, take it as told by your doctor. Do not stop taking the antibiotic even if you start to feel better.  If you develop a fever, do not take medicines to lower the fever right away. Tell your doctor about the fever. Managing pain, stiffness, and swelling  Try these methods to help with pain: ? Use a heating pad. ? Take a warm bath. ? Distract yourself, such as by watching TV. Eating and drinking  Drink enough fluid to keep your pee (urine) clear or pale yellow. Drink more in hot weather and during exercise.  Limit or avoid alcohol.  Eat a healthy diet. Eat plenty of fruits, vegetables, whole grains, and lean protein.  Take vitamins and supplements as told by your doctor. Traveling  When traveling, keep these with you: ? Your medical information. ? The names of your doctors. ? Your medicines.  If you need to take an airplane, talk to your doctor first. Activity  Rest often.  Avoid exercises that make your heart beat much faster, such as jogging. General instructions  Do not use products that have nicotine or tobacco, such as cigarettes and e-cigarettes. If you need help quitting, ask your doctor.  Consider wearing a medical alert bracelet.  Avoid being in high places (high altitudes), such as mountains.  Avoid very hot or cold temperatures.  Avoid places where the  temperature changes a lot.  Keep all follow-up visits as told by your doctor. This is important. Contact a doctor if:  A joint hurts.  Your feet or hands hurt or swell.  You feel tired (fatigued). Get help right away if:  You have symptoms of infection. These include: ? Fever. ? Chills. ? Being very tired. ? Irritability. ? Poor eating. ? Throwing up (vomiting).  You feel dizzy or faint.  You have new stomach pain, especially on the left side.  You have a an erection (priapism) that lasts more than 4 hours.  You have numbness in your arms or legs.  You have a hard time moving your arms or legs.  You have trouble talking.  You have pain that does not go away when you take medicine.  You are short of breath.  You are breathing fast.  You have a long-term cough.  You have pain in your chest.  You have a bad headache.  You have a stiff neck.  Your stomach looks bloated even though you did not eat much.  Your skin is pale.  You suddenly cannot see well. Summary  Sickle cell anemia is a condition where your red blood cells are shaped like sickles.  Follow your doctor's advice on ways to manage pain, food to eat, activities to do, and steps to take for safe travel.  Get medical help right away if you have any signs of infection, such as a fever. This information is not intended to replace advice given to you by   your health care provider. Make sure you discuss any questions you have with your health care provider. Document Revised: 12/06/2019 Document Reviewed: 12/06/2019 Elsevier Patient Education  2021 Elsevier Inc.  

## 2020-11-13 NOTE — Progress Notes (Signed)
[facility] [address] [place], [place]  [address] Phone:  [phone]   Fax:  [phone]   Established Patient Office Visit  Subjective:  Patient ID: Austin [name], male    DOB: 08/06/1990  Age: 30 y.o. MRN: [id number]  CC:  Chief Complaint  Patient presents with  . Follow-up    2 month follow up , he is going have to  foot surgery on left foot , need a refill on pain meds     HPI Austin [name] presents for follow up. He  has a past medical history of Acute maxillary sinusitis, Eczema, and Sickle cell anemia (HCC) (.).   He has an internal cyst and torn tissue on his left foot. He is waiting on consultation with Dr Logan Bores. He is following up for his SCD.  He reports that he is not having any pain today. He denies fever, headache, cough, wheezing, shortness of breath, chest pains, abdominal pain, constipation, back pain, hip pain, or leg pain. Denies any open wounds, skin irritation. He works full-time. He admits that he is being over exacerbations. He is standing a lot on his job. He denies any skin concerns. Last eye exam was is unknown.  He has allergic rhinitis. He has used Claritin or benadryl in the past however has not been on any medication recently. He feels like his symptoms are exacerbated due to being out at the park today with his wife and 35 year old son.   Past Medical History:  Diagnosis Date  . Acute maxillary sinusitis   . Eczema   . Sickle cell anemia (HCC) .    Past Surgical History:  Procedure Laterality Date  . WISDOM TOOTH EXTRACTION      Family History  Problem Relation Age of Onset  . Sickle cell trait Mother   . Diabetes Father   . Hypertension Father     Social History   Socioeconomic History  . Marital status: Married    Spouse name: Not on file  . Number of children: Not on file  . Years of education: Not on file  . Highest education level: Not on file  Occupational History  . Not on file  Tobacco  Use  . Smoking status: Former Smoker    Packs/day: 0.10  . Smokeless tobacco: Never Used  Vaping Use  . Vaping Use: Never used  Substance and Sexual Activity  . Alcohol use: Yes    Comment: occ  . Drug use: Yes    Types: Marijuana  . Sexual activity: Yes  Other Topics Concern  . Not on file  Social History Narrative  . Not on file   Social Determinants of Health   Financial Resource Strain: Not on file  Food Insecurity: Not on file  Transportation Needs: Not on file  Physical Activity: Not on file  Stress: Not on file  Social Connections: Not on file  Intimate Partner Violence: Not on file    Outpatient Medications Prior to Visit  Medication Sig Dispense Refill  . ibuprofen (ADVIL) 800 MG tablet Take 1 tablet (800 mg total) by mouth every 8 (eight) hours as needed. 30 tablet 3  . loratadine (CLARITIN) 10 MG tablet Take 1 tablet (10 mg total) by mouth daily. 30 tablet 6  . ondansetron (ZOFRAN) 4 MG tablet Take 1 tablet (4 mg total) by mouth every 8 (eight) hours as needed for nausea or vomiting. 20 tablet 3  . Oxycodone HCl 10  MG TABS Take 1 tablet (10 mg total) by mouth 4 (four) times daily as needed. 60 tablet 0  . promethazine (PHENERGAN) 25 MG tablet Take 1 tablet (25 mg total) by mouth every 6 (six) hours as needed for nausea or vomiting. 30 tablet 0   No facility-administered medications prior to visit.    Not on File  ROS Review of Systems    Objective:    Physical Exam HENT:     Head: Normocephalic and atraumatic.  Eyes:     Comments: Mild irritation  Cardiovascular:     Rate and Rhythm: Normal rate.     Pulses: Normal pulses.     Heart sounds: Normal heart sounds.  Pulmonary:     Effort: Pulmonary effort is normal.     Breath sounds: Normal breath sounds.  Abdominal:     Palpations: Abdomen is soft.  Musculoskeletal:        General: Normal range of motion.     Cervical back: Normal range of motion.  Skin:    General: Skin is warm.      Capillary Refill: Capillary refill takes less than 2 seconds.  Neurological:     General: No focal deficit present.     Mental Status: He is alert and oriented to person, place, and time.  Psychiatric:        Mood and Affect: Mood normal.        Behavior: Behavior normal.        Thought Content: Thought content normal.        Judgment: Judgment normal.     BP 108/66 (BP Location: Left Arm, Patient Position: Sitting, Cuff Size: Large)   Pulse 77   Temp 98.2 F (36.8 C) (Temporal)   Ht $R'5\' 7"'Qd$  (1.702 m)   Wt 189 lb 6.4 oz (85.9 kg)   SpO2 98%   BMI 29.66 kg/m  Wt Readings from Last 3 Encounters:  11/13/20 189 lb 6.4 oz (85.9 kg)  09/15/20 191 lb (86.6 kg)  09/13/20 200 lb (90.7 kg)     Health Maintenance Due  Topic Date Due  . Hepatitis C Screening  Never done  . COVID-19 Vaccine (3 - Booster for Pfizer series) 10/24/2020    There are no preventive care reminders to display for this patient.  Lab Results  Component Value Date   TSH 0.662 08/12/2017   Lab Results  Component Value Date   WBC 10.2 11/13/2020   HGB 13.6 11/13/2020   HCT 40.5 11/13/2020   MCV 84 11/13/2020   PLT 177 11/13/2020   Lab Results  Component Value Date   NA 138 11/13/2020   K 4.4 11/13/2020   CO2 25 11/13/2020   GLUCOSE 84 11/13/2020   BUN 8 11/13/2020   CREATININE 0.98 11/13/2020   BILITOT 1.1 11/13/2020   ALKPHOS 67 11/13/2020   AST 12 11/13/2020   ALT 27 11/13/2020   PROT 8.0 11/13/2020   ALBUMIN 4.8 11/13/2020   CALCIUM 9.4 11/13/2020   ANIONGAP 9 05/18/2020   EGFR 106 11/13/2020   No results found for: CHOL No results found for: HDL No results found for: LDLCALC No results found for: TRIG No results found for: CHOLHDL Lab Results  Component Value Date   HGBA1C 4.2 08/12/2017      Assessment & Plan:   Problem List Items Addressed This Visit      Other   Chronic pain syndrome Stable  Controlled   Relevant Medications   ibuprofen (ADVIL) 800 MG  tablet    Oxycodone HCl 10 MG TABS   Chronic, continuous use of opioids Stable   Relevant Medications   Oxycodone HCl 10 MG TABS   Hb-SS disease without crisis ([place]) - Primary Ensure adequate hydration. Move frequently to reduce venous thromboembolism risk. Avoid situations that could lead to dehydration or could exacerbate pain Discussed S&S of infection, seizures, stroke acute chest, DVT and how important it is to seek medical attention Take medication as directed along with pain contract and overall compliance Discussed the risk related to opiate use (addition, tolerance and dependency)     Relevant Orders   Sickle Cell Panel (Completed)     Other Visit Diagnoses    Seasonal allergies    Persistent  Will restart treatment    Relevant Medications   loratadine (CLARITIN) 10 MG tablet   Acute foot pain, left     Persistent awaiting surgical consult    Relevant Medications   ibuprofen (ADVIL) 800 MG tablet   Nausea and vomiting, intractability of vomiting not specified, unspecified vomiting type       Relevant Medications   promethazine (PHENERGAN) 25 MG tablet      Meds ordered this encounter  Medications  . Olopatadine HCl 0.2 % SOLN    Sig: Apply 1 drop to eye in the morning and at bedtime.    Dispense:  3 mL    Refill:  11    Order Specific Question:   Supervising Provider    Answer:   Tresa Garter [id number]  . loratadine (CLARITIN) 10 MG tablet    Sig: Take 1 tablet (10 mg total) by mouth daily.    Dispense:  30 tablet    Refill:  6    Order Specific Question:   Supervising Provider    Answer:   Tresa Garter [id number]  . ibuprofen (ADVIL) 800 MG tablet    Sig: Take 1 tablet (800 mg total) by mouth every 8 (eight) hours as needed.    Dispense:  30 tablet    Refill:  3    Order Specific Question:   Supervising Provider    Answer:   Tresa Garter [id number]  . Oxycodone HCl 10 MG TABS    Sig: Take 1 tablet (10 mg total) by mouth 4 (four) times daily  as needed.    Dispense:  60 tablet    Refill:  0    Order Specific Question:   Supervising Provider    Answer:   Tresa Garter [id number]  . promethazine (PHENERGAN) 25 MG tablet    Sig: Take 1 tablet (25 mg total) by mouth every 6 (six) hours as needed for nausea or vomiting.    Dispense:  30 tablet    Refill:  0    Order Specific Question:   Supervising Provider    Answer:   Tresa Garter [[id number]]    Follow-up: Return in about 2 months (around 01/13/2021).    Vevelyn Francois, NP

## 2020-11-14 ENCOUNTER — Ambulatory Visit: Payer: 59 | Admitting: Family Medicine

## 2020-11-15 LAB — CMP14+CBC/D/PLT+FER+RETIC+V...
ALT: 27 IU/L (ref 0–44)
AST: 12 IU/L (ref 0–40)
Albumin/Globulin Ratio: 1.5 (ref 1.2–2.2)
Albumin: 4.8 g/dL (ref 4.1–5.2)
Alkaline Phosphatase: 67 IU/L (ref 44–121)
BUN/Creatinine Ratio: 8 — ABNORMAL LOW (ref 9–20)
BUN: 8 mg/dL (ref 6–20)
Basophils Absolute: 0 10*3/uL (ref 0.0–0.2)
Basos: 0 %
Bilirubin Total: 1.1 mg/dL (ref 0.0–1.2)
CO2: 25 mmol/L (ref 20–29)
Calcium: 9.4 mg/dL (ref 8.7–10.2)
Chloride: 98 mmol/L (ref 96–106)
Creatinine, Ser: 0.98 mg/dL (ref 0.76–1.27)
EOS (ABSOLUTE): 0.2 10*3/uL (ref 0.0–0.4)
Eos: 2 %
Ferritin: 645 ng/mL — ABNORMAL HIGH (ref 30–400)
Globulin, Total: 3.2 g/dL (ref 1.5–4.5)
Glucose: 84 mg/dL (ref 65–99)
Hematocrit: 40.5 % (ref 37.5–51.0)
Hemoglobin: 13.6 g/dL (ref 13.0–17.7)
Immature Grans (Abs): 0 10*3/uL (ref 0.0–0.1)
Immature Granulocytes: 0 %
Lymphocytes Absolute: 3.8 10*3/uL — ABNORMAL HIGH (ref 0.7–3.1)
Lymphs: 37 %
MCH: 28.1 pg (ref 26.6–33.0)
MCHC: 33.6 g/dL (ref 31.5–35.7)
MCV: 84 fL (ref 79–97)
Monocytes Absolute: 0.9 10*3/uL (ref 0.1–0.9)
Monocytes: 9 %
Neutrophils Absolute: 5.3 10*3/uL (ref 1.4–7.0)
Neutrophils: 52 %
Platelets: 177 10*3/uL (ref 150–450)
Potassium: 4.4 mmol/L (ref 3.5–5.2)
RBC: 4.84 x10E6/uL (ref 4.14–5.80)
RDW: 16 % — ABNORMAL HIGH (ref 11.6–15.4)
Retic Ct Pct: 4.6 % — ABNORMAL HIGH (ref 0.6–2.6)
Sodium: 138 mmol/L (ref 134–144)
Total Protein: 8 g/dL (ref 6.0–8.5)
Vit D, 25-Hydroxy: 9.4 ng/mL — ABNORMAL LOW (ref 30.0–100.0)
WBC: 10.2 10*3/uL (ref 3.4–10.8)
eGFR: 106 mL/min/{1.73_m2} (ref >59)

## 2020-11-16 ENCOUNTER — Other Ambulatory Visit: Payer: Self-pay | Admitting: Nurse Practitioner

## 2020-11-16 ENCOUNTER — Encounter: Payer: Self-pay | Admitting: Nurse Practitioner

## 2020-11-16 DIAGNOSIS — E559 Vitamin D deficiency, unspecified: Secondary | ICD-10-CM

## 2020-11-16 MED ORDER — ERGOCALCIFEROL 1.25 MG (50000 UT) PO CAPS: 50000.0000 [IU] | ORAL_CAPSULE | ORAL | 0 refills | Status: AC

## 2020-11-16 NOTE — Progress Notes (Signed)
   [facility] Patient [facility] [address] [place], [place]  [address] Phone:  [phone]   Fax:  [phone]  Vitamin D deficiency.   Started vitamin D 50,000 units weekly.

## 2020-11-24 ENCOUNTER — Other Ambulatory Visit: Payer: Self-pay

## 2020-11-24 ENCOUNTER — Ambulatory Visit (INDEPENDENT_AMBULATORY_CARE_PROVIDER_SITE_OTHER): Payer: 59 | Admitting: Podiatry

## 2020-11-24 ENCOUNTER — Ambulatory Visit (INDEPENDENT_AMBULATORY_CARE_PROVIDER_SITE_OTHER): Payer: 59

## 2020-11-24 DIAGNOSIS — M67472 Ganglion, left ankle and foot: Secondary | ICD-10-CM

## 2020-11-24 DIAGNOSIS — M722 Plantar fascial fibromatosis: Secondary | ICD-10-CM

## 2020-11-24 DIAGNOSIS — M79672 Pain in left foot: Secondary | ICD-10-CM

## 2020-11-24 NOTE — Progress Notes (Signed)
   HPI: 30 y.o. male presenting today as a new patient referral from Dr. Elijah Birk, local podiatrist, for evaluation of left foot pain. Patient has been dealing with this pain for over 6 months now.  Patient states that he has received steroid injections and anti-inflammatories with shoe gear modifications with no significant relief.  He was referred here for possible surgical consultation.  He presents for further treatment and evaluation  Past Medical History:  Diagnosis Date  . Acute maxillary sinusitis   . Eczema   . Sickle cell anemia (HCC) .     Physical Exam: General: The patient is alert and oriented x3 in no acute distress.  Dermatology: Skin is warm, dry and supple bilateral lower extremities. Negative for open lesions or macerations.  Vascular: Palpable pedal pulses bilaterally. No edema or erythema noted. Capillary refill within normal limits.  Neurological: Epicritic and protective threshold grossly intact bilaterally.   Musculoskeletal Exam: Range of motion within normal limits to all pedal and ankle joints bilateral. Muscle strength 5/5 in all groups bilateral. Palpable lesion noted along the plantar arch with pain associated.   Radiographic Exam:  Normal osseous mineralization. Joint spaces preserved. No fracture/dislocation/boney destruction.    Assessment: 1. Plantar fibroma / plantar fasciitis left   Plan of Care:  1. Patient evaluated. X-Rays reviewed.  2. Patient has now dealt with this pain for over 6 months now and conservative treatment has been unsuccessful. MRI ordered left foot w/o contrast for surgical planning 3. RTC after MRI to review results and possible surgical consultation  *Works in [employer] at [employer]   Felecia Shelling, DPM [facility]  Dr. Felecia Shelling, DPM    [address] [place], [place] [address]                Office [phone]  Fax [phone]

## 2020-11-28 ENCOUNTER — Ambulatory Visit
Admission: RE | Admit: 2020-11-28 | Discharge: 2020-11-28 | Disposition: A | Discharge status: Home / Self Care | Payer: 59 | Source: Ambulatory Visit | Attending: Podiatry | Admitting: Podiatry

## 2020-11-28 ENCOUNTER — Other Ambulatory Visit: Payer: Self-pay

## 2020-11-28 DIAGNOSIS — M67472 Ganglion, left ankle and foot: Secondary | ICD-10-CM

## 2020-11-28 DIAGNOSIS — M79672 Pain in left foot: Secondary | ICD-10-CM

## 2020-11-28 DIAGNOSIS — S96912A Strain of unspecified muscle and tendon at ankle and foot level, left foot, initial encounter: Secondary | ICD-10-CM | POA: Diagnosis not present

## 2020-11-28 DIAGNOSIS — M722 Plantar fascial fibromatosis: Secondary | ICD-10-CM

## 2020-12-02 ENCOUNTER — Other Ambulatory Visit: Payer: Self-pay

## 2020-12-02 DIAGNOSIS — F1190 Opioid use, unspecified, uncomplicated: Secondary | ICD-10-CM

## 2020-12-02 DIAGNOSIS — G894 Chronic pain syndrome: Secondary | ICD-10-CM

## 2020-12-03 ENCOUNTER — Telehealth: Payer: Self-pay | Admitting: Podiatry

## 2020-12-03 NOTE — Telephone Encounter (Signed)
Patient called inquiring about MRI results . Patient has expressed concerns for moving forward with surgery, Please Advise

## 2020-12-04 ENCOUNTER — Emergency Department (HOSPITAL_COMMUNITY)
Admission: EM | Admit: 2020-12-04 | Discharge: 2020-12-04 | Disposition: A | Discharge status: Home / Self Care | Payer: 59 | Attending: Emergency Medicine | Admitting: Emergency Medicine

## 2020-12-04 ENCOUNTER — Encounter (HOSPITAL_COMMUNITY): Payer: Self-pay

## 2020-12-04 DIAGNOSIS — J029 Acute pharyngitis, unspecified: Secondary | ICD-10-CM | POA: Diagnosis not present

## 2020-12-04 DIAGNOSIS — M545 Low back pain, unspecified: Secondary | ICD-10-CM | POA: Diagnosis not present

## 2020-12-04 DIAGNOSIS — D5700 Hb-SS disease with crisis, unspecified: Secondary | ICD-10-CM | POA: Insufficient documentation

## 2020-12-04 DIAGNOSIS — Z20822 Contact with and (suspected) exposure to covid-19: Secondary | ICD-10-CM | POA: Diagnosis not present

## 2020-12-04 DIAGNOSIS — Z87891 Personal history of nicotine dependence: Secondary | ICD-10-CM | POA: Insufficient documentation

## 2020-12-04 DIAGNOSIS — L539 Erythematous condition, unspecified: Secondary | ICD-10-CM | POA: Insufficient documentation

## 2020-12-04 DIAGNOSIS — R599 Enlarged lymph nodes, unspecified: Secondary | ICD-10-CM | POA: Diagnosis not present

## 2020-12-04 LAB — CBC WITH DIFFERENTIAL/PLATELET
Abs Immature Granulocytes: 0.05 10*3/uL (ref 0.00–0.07)
Basophils Absolute: 0.1 10*3/uL (ref 0.0–0.1)
Basophils Relative: 0 %
Eosinophils Absolute: 0.3 10*3/uL (ref 0.0–0.5)
Eosinophils Relative: 2 %
HCT: 35.3 % — ABNORMAL LOW (ref 39.0–52.0)
Hemoglobin: 12.6 g/dL — ABNORMAL LOW (ref 13.0–17.0)
Immature Granulocytes: 0 %
Lymphocytes Relative: 21 %
Lymphs Abs: 2.6 10*3/uL (ref 0.7–4.0)
MCH: 28.3 pg (ref 26.0–34.0)
MCHC: 35.7 g/dL (ref 30.0–36.0)
MCV: 79.1 fL — ABNORMAL LOW (ref 80.0–100.0)
Monocytes Absolute: 1 10*3/uL (ref 0.1–1.0)
Monocytes Relative: 8 %
Neutro Abs: 8.6 10*3/uL — ABNORMAL HIGH (ref 1.7–7.7)
Neutrophils Relative %: 69 %
Platelets: 176 10*3/uL (ref 150–400)
RBC: 4.46 MIL/uL (ref 4.22–5.81)
RDW: 15.1 % (ref 11.5–15.5)
WBC: 12.6 10*3/uL — ABNORMAL HIGH (ref 4.0–10.5)
nRBC: 0.2 % (ref 0.0–0.2)

## 2020-12-04 LAB — BASIC METABOLIC PANEL
Anion gap: 8 (ref 5–15)
BUN: 11 mg/dL (ref 6–20)
CO2: 27 mmol/L (ref 22–32)
Calcium: 9.6 mg/dL (ref 8.9–10.3)
Chloride: 100 mmol/L (ref 98–111)
Creatinine, Ser: 0.96 mg/dL (ref 0.61–1.24)
GFR, Estimated: >60 mL/min (ref >60)
Glucose, Bld: 165 mg/dL — ABNORMAL HIGH (ref 70–99)
Potassium: 3.8 mmol/L (ref 3.5–5.1)
Sodium: 135 mmol/L (ref 135–145)

## 2020-12-04 LAB — RESP PANEL BY RT-PCR (FLU A&B, COVID) ARPGX2
Influenza A by PCR: NEGATIVE
Influenza B by PCR: NEGATIVE
SARS Coronavirus 2 by RT PCR: NEGATIVE

## 2020-12-04 LAB — RETICULOCYTES
Immature Retic Fract: 29 % — ABNORMAL HIGH (ref 2.3–15.9)
RBC.: 4.48 MIL/uL (ref 4.22–5.81)
Retic Count, Absolute: 168.9 10*3/uL (ref 19.0–186.0)
Retic Ct Pct: 3.8 % — ABNORMAL HIGH (ref 0.4–3.1)

## 2020-12-04 LAB — GROUP A STREP BY PCR: Group A Strep by PCR: NOT DETECTED

## 2020-12-04 MED ORDER — LIDOCAINE VISCOUS HCL 2 % MT SOLN
15.0000 mL | Freq: Once | OROMUCOSAL | Status: AC
  Administered 2020-12-04: 15 mL via OROMUCOSAL
  Filled 2020-12-04: qty 15

## 2020-12-04 MED ORDER — LIDOCAINE VISCOUS HCL 2 % MT SOLN: 15.0000 mL | OROMUCOSAL | 0 refills | Status: DC | PRN

## 2020-12-04 MED ORDER — KETOROLAC TROMETHAMINE 15 MG/ML IJ SOLN
15.0000 mg | INTRAMUSCULAR | Status: AC
  Administered 2020-12-04: 15 mg via INTRAVENOUS
  Filled 2020-12-04: qty 1

## 2020-12-04 MED ORDER — HYDROMORPHONE HCL 2 MG/ML IJ SOLN
2.0000 mg | INTRAMUSCULAR | Status: AC
  Administered 2020-12-04: 2 mg via INTRAVENOUS
  Filled 2020-12-04: qty 1

## 2020-12-04 MED ORDER — ONDANSETRON HCL 4 MG/2ML IJ SOLN
4.0000 mg | INTRAMUSCULAR | Status: DC | PRN
  Administered 2020-12-04: 4 mg via INTRAVENOUS
  Filled 2020-12-04: qty 2

## 2020-12-04 MED ORDER — OXYCODONE HCL 10 MG PO TABS: 10.0000 mg | ORAL_TABLET | Freq: Four times a day (QID) | ORAL | 0 refills | Status: DC | PRN

## 2020-12-04 MED ORDER — DIPHENHYDRAMINE HCL 25 MG PO CAPS
25.0000 mg | ORAL_CAPSULE | ORAL | Status: DC | PRN
Start: 2020-12-04 — End: 2020-12-04

## 2020-12-04 MED ORDER — HYDROMORPHONE HCL 2 MG/ML IJ SOLN
2.0000 mg | INTRAMUSCULAR | Status: AC
Start: 2020-12-04 — End: 2020-12-04
  Administered 2020-12-04: 2 mg via INTRAVENOUS
  Filled 2020-12-04: qty 1

## 2020-12-04 NOTE — ED Provider Notes (Signed)
[facility]-EMERGENCY DEPT Provider Note   CSN: [id number] Arrival date & time: 12/04/20  0753     History Chief Complaint  Patient presents with  . Sickle Cell Pain Crisis  . Sore Throat    Austin [name] is a 30 y.o. male.  The history is provided by the patient. No language interpreter was used.  Sickle Cell Pain Crisis Sore Throat     30 year old male significant history of sickle cell anemia, chronic pain syndrome, who presents complaining back pain and sore throat.  Patient report for the past 4 days he has had progressive worsening pain in his throat.  Pain is described as a soreness throbbing achy sensation, moderate in severity and last night he was awoke with sensation of his throat closing.  He also endorsed having pain in his low back that started last night as well.  Sharp achy pain to the back that felt very similar to prior sickle cell crisis.  Tried taking his home medication without relief.  No associated fever runny nose sneezing coughing ear pain chest pain trouble breathing abdominal pain dysuria or rash.  He has been fully vaccinated for COVID-19.  He denies any sick recent sick contact.  Past Medical History:  Diagnosis Date  . Acute maxillary sinusitis   . Eczema   . Sickle cell anemia (HCC) .    Patient Active Problem List   Diagnosis Date Noted  . Vitamin D deficiency 11/16/2020  . Bilateral lower extremity pain 10/17/2019  . Sickle cell anemia without crisis (HCC) 09/20/2019  . Chronic pain syndrome 09/20/2019  . Nausea 09/20/2019  . Chronic, continuous use of opioids 09/20/2019  . Medication management 03/20/2019  . Bilious vomiting without nausea   . Viral gastroenteritis 01/23/2019  . Nausea vomiting and diarrhea   . Generalized abdominal pain   . Hb-SS disease with crisis (HCC)   . Dehydration   . Sickle cell pain crisis (HCC) 05/18/2018  . Acute sickle cell crisis (HCC) 12/15/2017  . Thrombocytopenia (HCC)  12/15/2017  . Splenomegaly 12/15/2017  . Sickle cell anemia with crisis (HCC) 12/15/2017  . Spleen enlarged 08/18/2017    Past Surgical History:  Procedure Laterality Date  . WISDOM TOOTH EXTRACTION         Family History  Problem Relation Age of Onset  . Sickle cell trait Mother   . Diabetes Father   . Hypertension Father     Social History   Tobacco Use  . Smoking status: Former Smoker    Packs/day: 0.10  . Smokeless tobacco: Never Used  Vaping Use  . Vaping Use: Never used  Substance Use Topics  . Alcohol use: Yes    Comment: occ  . Drug use: Yes    Types: Marijuana    Home Medications Prior to Admission medications   Medication Sig Start Date End Date Taking? Authorizing Provider  ergocalciferol (VITAMIN D2) 1.25 MG (50000 UT) capsule Take 1 capsule (50,000 Units total) by mouth once a week. X 12 weeks. 11/16/20 02/14/21  Barbette Merino, NP  ibuprofen (ADVIL) 800 MG tablet Take 1 tablet (800 mg total) by mouth every 8 (eight) hours as needed. 11/13/20   Barbette Merino, NP  loratadine (CLARITIN) 10 MG tablet Take 1 tablet (10 mg total) by mouth daily. 11/13/20   Barbette Merino, NP  Olopatadine HCl 0.2 % SOLN Apply 1 drop to eye in the morning and at bedtime. 11/13/20 11/13/21  Barbette Merino, NP  Oxycodone  HCl 10 MG TABS Take 1 tablet (10 mg total) by mouth 4 (four) times daily as needed. 11/13/20   Barbette Merino, NP  promethazine (PHENERGAN) 25 MG tablet Take 1 tablet (25 mg total) by mouth every 6 (six) hours as needed for nausea or vomiting. 11/13/20   Barbette Merino, NP    Allergies    Patient has no known allergies.  Review of Systems   Review of Systems  All other systems reviewed and are negative.   Physical Exam Updated Vital Signs BP (!) 134/114 (BP Location: Right Arm)   Pulse 88   Temp 98 F (36.7 C) (Oral)   Resp 18   SpO2 100%   Physical Exam Vitals and nursing note reviewed.  Constitutional:      General: He is not in acute distress.     Appearance: He is well-developed.  HENT:     Head: Atraumatic.     Mouth/Throat:     Mouth: Mucous membranes are moist.     Pharynx: Uvula midline. Pharyngeal swelling and posterior oropharyngeal erythema present. No uvula swelling.     Comments: Bilateral tonsillar enlargement with a bit of fullness noted to the right tonsillar pillar without obvious PTA.  Uvula midline.  No trismus.  No discharge noted. Eyes:     Conjunctiva/sclera: Conjunctivae normal.  Cardiovascular:     Rate and Rhythm: Normal rate and regular rhythm.     Heart sounds: Normal heart sounds.  Pulmonary:     Effort: Pulmonary effort is normal.     Breath sounds: Normal breath sounds.  Abdominal:     Palpations: Abdomen is soft.     Comments: No splenomegaly  Musculoskeletal:     Cervical back: Neck supple.     Comments: No significant midline spine tenderness.  tenderness to lumbar paraspinal muscle bilaterally.  Lymphadenopathy:     Cervical: Cervical adenopathy present.  Skin:    Findings: No rash.  Neurological:     Mental Status: He is alert.  Psychiatric:        Mood and Affect: Mood normal.     ED Results / Procedures / Treatments   Labs (all labs ordered are listed, but only abnormal results are displayed) Labs Reviewed  RETICULOCYTES - Abnormal; Notable for the following components:      Result Value   Retic Ct Pct 3.8 (*)    Immature Retic Fract 29.0 (*)    All other components within normal limits  BASIC METABOLIC PANEL - Abnormal; Notable for the following components:   Glucose, Bld 165 (*)    All other components within normal limits  CBC WITH DIFFERENTIAL/PLATELET - Abnormal; Notable for the following components:   WBC 12.6 (*)    Hemoglobin 12.6 (*)    HCT 35.3 (*)    MCV 79.1 (*)    Neutro Abs 8.6 (*)    All other components within normal limits  GROUP A STREP BY PCR  RESP PANEL BY RT-PCR (FLU A&B, COVID) [id number]    EKG None  Radiology No results  found.  Procedures Procedures   Medications Ordered in ED Medications  diphenhydrAMINE (BENADRYL) capsule 25-50 mg (has no administration in time range)  ondansetron (ZOFRAN) injection 4 mg (4 mg Intravenous Given 12/04/20 1000)  lidocaine (XYLOCAINE) 2 % viscous mouth solution 15 mL (15 mLs Mouth/Throat Given 12/04/20 0844)  ketorolac (TORADOL) 15 MG/ML injection 15 mg (15 mg Intravenous Given 12/04/20 0845)  HYDROmorphone (DILAUDID) injection 2 mg (2 mg Intravenous  Given 12/04/20 0903)  HYDROmorphone (DILAUDID) injection 2 mg (2 mg Intravenous Given 12/04/20 7564)    ED Course  I have reviewed the triage vital signs and the nursing notes.  Pertinent labs & imaging results that were available during my care of the patient were reviewed by me and considered in my medical decision making (see chart for details).    MDM Rules/Calculators/A&P                          Blood pressure (!) 150/112, pulse 83, temperature 98 F (36.7 C), temperature source Oral, resp. rate 20, height 5\' 7"  (1.702 m), weight 85 kg, SpO2 95 %.   Final Clinical Impression(s) / ED Diagnoses Final diagnoses:  Pharyngitis, unspecified etiology  Sickle cell pain crisis (HCC)    Rx / DC Orders ED Discharge Orders         Ordered    lidocaine (XYLOCAINE) 2 % solution  As needed        12/04/20 1157         8:28 AM Patient here with sore throat for the past 4 days.  Throat exam reveals tonsillar enlargement with slight fullness noted to the right tonsillar pillar but no obvious signs of peritonsillar abscess.  Uvula is midline.  He also now endorse having lower back pain that felt similar to sickle cell crisis.  I suspect his throat infection is likely contributing to his sickle cell crisis.  Will screen for strep throat, check labs, provide symptomatic treatment.  Patient is nontoxic in appearance  11:54 AM COVID and flu test negative, group A strep test negative as well.  Mildly elevated white count of 12.6.   Hemoglobin is also 12.6.  Patient received treatment for his sickle cell related pain and at this time felt much better and request to be discharged.  Will prescribe viscous lidocaine for sore throat.  Return precaution given.   02/03/21, PA-C 12/04/20 1158    02/03/21, MD 12/04/20 [phone]

## 2020-12-04 NOTE — ED Triage Notes (Signed)
Pt presents with c/o sickle cell pain in his lower back that started last night. Pt has taken home meds with no relief. Pt also c/o sore throat for 4 days.

## 2020-12-15 ENCOUNTER — Ambulatory Visit (INDEPENDENT_AMBULATORY_CARE_PROVIDER_SITE_OTHER): Payer: 59 | Admitting: Podiatry

## 2020-12-15 ENCOUNTER — Other Ambulatory Visit: Payer: Self-pay

## 2020-12-15 DIAGNOSIS — M722 Plantar fascial fibromatosis: Secondary | ICD-10-CM

## 2020-12-15 NOTE — Progress Notes (Signed)
   HPI: 30 y.o. male presenting today for follow-up evaluation of plantar fasciitis to the left foot.  Patient states he continues to be very painful.  He has tried multiple conservative modalities with minimal relief.  Last visit MRI was ordered.  He presents to review the MRI results and discuss further treatment options including surgery  Past Medical History:  Diagnosis Date  . Acute maxillary sinusitis   . Eczema   . Sickle cell anemia (HCC) .     Physical Exam: General: The patient is alert and oriented x3 in no acute distress.  Dermatology: Skin is warm, dry and supple bilateral lower extremities. Negative for open lesions or macerations.  Vascular: Palpable pedal pulses bilaterally. No edema or erythema noted. Capillary refill within normal limits.  Neurological: Epicritic and protective threshold grossly intact bilaterally.   Musculoskeletal Exam: Range of motion within normal limits to all pedal and ankle joints bilateral. Muscle strength 5/5 in all groups bilateral. Palpable lesion noted along the plantar arch with pain associated.   MRI impression LT foot w/out contrast 11/28/2020: 1. Partial-thickness tear of the medial band of the plantar fascia 8.5 cm distal to the calcaneal insertion at the level of the first MTP joint.  Assessment: 1.  Partial thickness tear/ plantar fasciitis left   Plan of Care:  1. Patient evaluated.  MRI reviewed.  2. Today we discussed the conservative versus surgical management of the presenting pathology. The patient opts for surgical management. All possible complications and details of the procedure were explained. All patient questions were answered. No guarantees were expressed or implied. 3. Authorization for surgery was initiated today. Surgery will consist of endoscopic, possible open, plantar fasciotomy left 4.  Return to clinic 1 week postop  *Works in [employer] at [employer]   Felecia Shelling, DPM [facility]  Dr. Felecia Shelling, DPM    [address] [place], [place] [address]                Office [phone]  Fax [phone]

## 2020-12-16 ENCOUNTER — Telehealth: Payer: Self-pay | Admitting: Urology

## 2020-12-16 NOTE — Telephone Encounter (Signed)
DOS - 12/25/20  EPF LEFT --- 38756   [facility] EFFECTIVE DATE - 07/26/20   PLAN DEDUCTIBLE - This does not apply to your plan OUT OF POCKET - $4000.00 W/ $3,022.91 REMAINING COINSURANCE - 40% COPAY - $0.00   SPOKE WITH VILLISHA WITH [employer] AND SHE STATED THAT FOR CPT CODE 43329 NO PRIOR AUTH IS REQUIRED.   REF # [id number]

## 2020-12-18 ENCOUNTER — Other Ambulatory Visit: Payer: Self-pay

## 2020-12-18 DIAGNOSIS — F1190 Opioid use, unspecified, uncomplicated: Secondary | ICD-10-CM

## 2020-12-18 DIAGNOSIS — G894 Chronic pain syndrome: Secondary | ICD-10-CM

## 2020-12-18 DIAGNOSIS — R112 Nausea with vomiting, unspecified: Secondary | ICD-10-CM

## 2020-12-18 MED ORDER — OXYCODONE HCL 10 MG PO TABS: 10.0000 mg | ORAL_TABLET | Freq: Four times a day (QID) | ORAL | 0 refills | Status: DC | PRN

## 2020-12-18 MED ORDER — PROMETHAZINE HCL 25 MG PO TABS: 25.0000 mg | ORAL_TABLET | Freq: Four times a day (QID) | ORAL | 0 refills | Status: DC | PRN

## 2020-12-25 ENCOUNTER — Encounter: Payer: Self-pay | Admitting: Podiatry

## 2020-12-25 DIAGNOSIS — M722 Plantar fascial fibromatosis: Secondary | ICD-10-CM | POA: Diagnosis not present

## 2020-12-25 DIAGNOSIS — M25572 Pain in left ankle and joints of left foot: Secondary | ICD-10-CM | POA: Diagnosis not present

## 2020-12-25 HISTORY — PX: FOOT SURGERY: SHX648

## 2020-12-26 ENCOUNTER — Telehealth: Payer: Self-pay | Admitting: Podiatry

## 2020-12-26 ENCOUNTER — Other Ambulatory Visit: Payer: Self-pay | Admitting: Podiatry

## 2020-12-26 DIAGNOSIS — M722 Plantar fascial fibromatosis: Secondary | ICD-10-CM

## 2020-12-26 NOTE — Telephone Encounter (Signed)
He was prescribed sixty oxycodone10 last week by his PCP; take with motrin 800 three times daily.  I just placed an order for crutches.

## 2020-12-26 NOTE — Telephone Encounter (Signed)
Patient is calling to request crutches after surgery yesterday. Patient states he can not put any pressure on his foot and his mobility is very limited. He also states he needs pain medication post op.

## 2020-12-26 NOTE — Progress Notes (Signed)
PRN postop 

## 2020-12-31 ENCOUNTER — Other Ambulatory Visit: Payer: Self-pay

## 2020-12-31 ENCOUNTER — Ambulatory Visit (INDEPENDENT_AMBULATORY_CARE_PROVIDER_SITE_OTHER): Payer: 59 | Admitting: Podiatry

## 2020-12-31 ENCOUNTER — Telehealth: Payer: Self-pay

## 2020-12-31 DIAGNOSIS — Z9889 Other specified postprocedural states: Secondary | ICD-10-CM

## 2020-12-31 NOTE — Telephone Encounter (Signed)
Order for knee scooter submitted to [employer].

## 2020-12-31 NOTE — Progress Notes (Signed)
   Subjective:  Patient presents today status post EPF left 60. DOS: 09/14/2020.  Patient states that he is doing very well.  He has not applying any pressure to the foot.  He has been wearing the cam boot and kept the dressings clean and dry as instructed.  Past Medical History:  Diagnosis Date  . Acute maxillary sinusitis   . Eczema   . Sickle cell anemia (HCC) .      Objective/Physical Exam Neurovascular status intact.  Skin incisions appear to be well coapted with sutures intact. No sign of infectious process noted. No dehiscence. No active bleeding noted. Moderate edema noted to the surgical extremity.   Assessment: 1. s/p EPF LT.  DOS: 12/25/2020   Plan of Care:  1. Patient was evaluated. 2.  Order placed today for a knee scooter so the patient can be with nonweightbearing 3.  Continue cam boot. 4.  Return to clinic in 1 week for suture removal   Felecia Shelling, DPM [facility]  Dr. Felecia Shelling, DPM    [address] [place], [place] [address]                Office [phone]  Fax [phone]

## 2021-01-05 ENCOUNTER — Ambulatory Visit (INDEPENDENT_AMBULATORY_CARE_PROVIDER_SITE_OTHER): Payer: 59 | Admitting: Podiatry

## 2021-01-05 ENCOUNTER — Other Ambulatory Visit: Payer: Self-pay

## 2021-01-05 DIAGNOSIS — M722 Plantar fascial fibromatosis: Secondary | ICD-10-CM

## 2021-01-05 DIAGNOSIS — Z9889 Other specified postprocedural states: Secondary | ICD-10-CM

## 2021-01-05 NOTE — Progress Notes (Signed)
   Subjective:  Patient presents today status post EPF left 60. DOS: 09/14/2020.  Patient states that he is doing very well.  He has not applying any pressure to the foot.  He has been wearing the cam boot and using crutches.  He also takes the oxycodone 10 mg from pain management for his sickle cell anemia  Past Medical History:  Diagnosis Date   Acute maxillary sinusitis    Eczema    Sickle cell anemia (HCC) .      Objective/Physical Exam Neurovascular status intact.  Skin incisions appear to be well coapted with sutures intact. No sign of infectious process noted. No dehiscence. No active bleeding noted. Moderate edema noted to the surgical extremity specifically to the foot.   Assessment: 1. s/p EPF LT.  DOS: 12/25/2020   Plan of Care:  1. Patient was evaluated. 2.  Patient states that he is picking at the knee scooter today 3.  Sutures removed 4.  Continue Ace wrap daily with the cam boot 5.  Patient may begin weightbearing in the cam boot.  Slowly transition from nonweightbearing to weightbearing 6.  Return to clinic in 4 weeks.  At this time we may need to initiate physical therapy if he has not improved significantly   Felecia Shelling, DPM [facility]  Dr. Felecia Shelling, DPM    [address] [place], [place] [address]                Office [phone]  Fax [phone]

## 2021-01-09 ENCOUNTER — Other Ambulatory Visit: Payer: Self-pay

## 2021-01-09 DIAGNOSIS — R112 Nausea with vomiting, unspecified: Secondary | ICD-10-CM

## 2021-01-09 DIAGNOSIS — F1190 Opioid use, unspecified, uncomplicated: Secondary | ICD-10-CM

## 2021-01-09 DIAGNOSIS — G894 Chronic pain syndrome: Secondary | ICD-10-CM

## 2021-01-10 MED ORDER — PROMETHAZINE HCL 25 MG PO TABS: 25.0000 mg | ORAL_TABLET | Freq: Four times a day (QID) | ORAL | 0 refills | Status: DC | PRN

## 2021-01-10 MED ORDER — OXYCODONE HCL 10 MG PO TABS: 10.0000 mg | ORAL_TABLET | Freq: Four times a day (QID) | ORAL | 0 refills | Status: DC | PRN

## 2021-01-12 ENCOUNTER — Encounter: Payer: 59 | Admitting: Podiatry

## 2021-01-14 ENCOUNTER — Ambulatory Visit: Payer: 59 | Admitting: Nurse Practitioner

## 2021-01-14 ENCOUNTER — Other Ambulatory Visit: Payer: Self-pay

## 2021-01-14 ENCOUNTER — Encounter: Payer: Self-pay | Admitting: Nurse Practitioner

## 2021-01-14 VITALS — BP 119/74 | HR 92 | Temp 97.2°F | Ht 67.0 in | Wt 193.0 lb

## 2021-01-14 DIAGNOSIS — G894 Chronic pain syndrome: Secondary | ICD-10-CM

## 2021-01-14 DIAGNOSIS — E559 Vitamin D deficiency, unspecified: Secondary | ICD-10-CM | POA: Diagnosis not present

## 2021-01-14 DIAGNOSIS — D571 Sickle-cell disease without crisis: Secondary | ICD-10-CM

## 2021-01-14 DIAGNOSIS — Z9889 Other specified postprocedural states: Secondary | ICD-10-CM

## 2021-01-14 DIAGNOSIS — M79672 Pain in left foot: Secondary | ICD-10-CM | POA: Diagnosis not present

## 2021-01-14 MED ORDER — IBUPROFEN 800 MG PO TABS: 800.0000 mg | ORAL_TABLET | Freq: Three times a day (TID) | ORAL | 3 refills | Status: DC | PRN

## 2021-01-14 NOTE — Progress Notes (Signed)
[address] [place], [place]  [address] Phone:  [phone]   Fax:  [phone]   Established Patient Office Visit  Subjective:  Patient ID: Austin [name], male    DOB: Nov 23, 1990  Age: 30 y.o. MRN: [id number]  CC:  Chief Complaint  Patient presents with   Follow-up    Foot Surgery 6/2; was not given any pain medication due to him taking oxycodone     HPI Austin [name] presents for follow up. He  has a past medical history of Acute maxillary sinusitis, Eczema, and Sickle cell anemia (HCC) (.).   He has been having increased pain. He reports using more his Oxycodone 10 mg. He has some numbness on 1-2 both . He is not able to bear weight. He using a motorized scooter. He reports that he feels like he has to learn how to walk again. He has followed up with surgeon on June 13.    Denies fever, headache, cough, wheezing, shortness of breath, chest pains, abdominal pain, back pain, hip pain,  Past Medical History:  Diagnosis Date   Acute maxillary sinusitis    Eczema    Sickle cell anemia ([place]) .    Past Surgical History:  Procedure Laterality Date   WISDOM TOOTH EXTRACTION      Family History  Problem Relation Age of Onset   Sickle cell trait Mother    Diabetes Father    Hypertension Father     Social History   Socioeconomic History   Marital status: Married    Spouse name: Not on file   Number of children: Not on file   Years of education: Not on file   Highest education level: Not on file  Occupational History   Not on file  Tobacco Use   Smoking status: Former    Packs/day: 0.10    Pack years: 0.00    Types: Cigarettes   Smokeless tobacco: Never  Vaping Use   Vaping Use: Never used  Substance and Sexual Activity   Alcohol use: Yes    Comment: occ   Drug use: Yes    Types: Marijuana   Sexual activity: Yes  Other Topics Concern   Not on file  Social History Narrative   Not on file   Social Determinants  of Health   Financial Resource Strain: Not on file  Food Insecurity: Not on file  Transportation Needs: Not on file  Physical Activity: Not on file  Stress: Not on file  Social Connections: Not on file  Intimate Partner Violence: Not on file    Outpatient Medications Prior to Visit  Medication Sig Dispense Refill   ergocalciferol (VITAMIN D2) 1.25 MG (50000 UT) capsule Take 1 capsule (50,000 Units total) by mouth once a week. X 12 weeks. 12 capsule 0   loratadine (CLARITIN) 10 MG tablet Take 1 tablet (10 mg total) by mouth daily. 30 tablet 6   Olopatadine HCl 0.2 % SOLN Apply 1 drop to eye in the morning and at bedtime. 3 mL 11   Oxycodone HCl 10 MG TABS Take 1 tablet (10 mg total) by mouth 4 (four) times daily as needed. 60 tablet 0   promethazine (PHENERGAN) 25 MG tablet Take 1 tablet (25 mg total) by mouth every 6 (six) hours as needed for nausea or vomiting. 30 tablet 0   ibuprofen (ADVIL) 800 MG tablet Take 1 tablet (800 mg total) by mouth every 8 (eight) hours as needed. [place]  tablet 3   lidocaine (XYLOCAINE) 2 % solution Use as directed 15 mLs in the mouth or throat as needed for mouth pain. 150 mL 0   No facility-administered medications prior to visit.    No Known Allergies  ROS Review of Systems    Objective:    Physical Exam HENT:     Head: Normocephalic.     Nose: Nose normal.  Cardiovascular:     Rate and Rhythm: Normal rate.     Pulses: Normal pulses.     Heart sounds: Normal heart sounds.  Pulmonary:     Effort: Pulmonary effort is normal.     Breath sounds: Normal breath sounds.  Abdominal:     Palpations: Abdomen is soft.  Musculoskeletal:     Right lower leg: No edema.     Left lower leg: No edema.     Comments: Leg scooter in use  Skin:    General: Skin is warm and dry.     Capillary Refill: Capillary refill takes less than 2 seconds.  Neurological:     General: No focal deficit present.     Mental Status: He is alert and oriented to person,  place, and time.  Psychiatric:        Mood and Affect: Mood normal.        Behavior: Behavior normal.        Thought Content: Thought content normal.        Judgment: Judgment normal.   BP 119/74 (BP Location: Right Arm, Patient Position: Sitting)   Pulse 92   Temp (!) 97.2 F (36.2 C)   Ht $R'5\' 7"'Nz$  (1.702 m)   Wt 193 lb (87.5 kg)   SpO2 96%   BMI 30.23 kg/m  Wt Readings from Last 3 Encounters:  01/14/21 193 lb (87.5 kg)  12/04/20 187 lb 6.3 oz (85 kg)  11/13/20 189 lb 6.4 oz (85.9 kg)     There are no preventive care reminders to display for this patient.   There are no preventive care reminders to display for this patient.  Lab Results  Component Value Date   TSH 0.662 08/12/2017   Lab Results  Component Value Date   WBC 12.6 (H) 12/04/2020   HGB 12.6 (L) 12/04/2020   HCT 35.3 (L) 12/04/2020   MCV 79.1 (L) 12/04/2020   PLT 176 12/04/2020   Lab Results  Component Value Date   NA 135 12/04/2020   K 3.8 12/04/2020   CO2 27 12/04/2020   GLUCOSE 165 (H) 12/04/2020   BUN 11 12/04/2020   CREATININE 0.96 12/04/2020   BILITOT 1.1 11/13/2020   ALKPHOS 67 11/13/2020   AST 12 11/13/2020   ALT 27 11/13/2020   PROT 8.0 11/13/2020   ALBUMIN 4.8 11/13/2020   CALCIUM 9.6 12/04/2020   ANIONGAP 8 12/04/2020   EGFR 106 11/13/2020   No results found for: CHOL No results found for: HDL No results found for: LDLCALC No results found for: TRIG No results found for: CHOLHDL Lab Results  Component Value Date   HGBA1C 4.2 08/12/2017      Assessment & Plan:   Problem List Items Addressed This Visit       Other   Chronic pain syndrome    Relevant Medications   ibuprofen (ADVIL) 800 MG tablet   Other Relevant Orders   Ambulatory referral to Physical Therapy   Vitamin D deficiency   Relevant Orders   Ambulatory referral to Physical Therapy   Other Visit Diagnoses  Sickle-cell disease without crisis ([place])    -  Primary Ensure adequate hydration. Move  frequently to reduce venous thromboembolism risk. Avoid situations that could lead to dehydration or could exacerbate pain Discussed S&S of infection, seizures, stroke acute chest, DVT and how important it is to seek medical attention Take medication as directed along with pain contract and overall compliance Discussed the risk related to opiate use (addition, tolerance and dependency) Referral for physical therapy overall assistance with ambulation due to recent surgery    Relevant Orders   Ambulatory referral to Physical Therapy   Status post left foot surgery       Relevant Orders   Ambulatory referral to Physical Therapy   Acute foot pain, left       Relevant Medications   ibuprofen (ADVIL) 800 MG tablet       Meds ordered this encounter  Medications   ibuprofen (ADVIL) 800 MG tablet    Sig: Take 1 tablet (800 mg total) by mouth every 8 (eight) hours as needed.    Dispense:  30 tablet    Refill:  3    Order Specific Question:   Supervising Provider    Answer:   Tresa Garter [id number]    Follow-up: Return in about 2 months (around 03/16/2021) for SCD 43888.    Vevelyn Francois, NP

## 2021-01-14 NOTE — Patient Instructions (Signed)
Sickle Cell Anemia, Adult ?Sickle cell anemia is a condition where your red blood cells are shaped like sickles. Red blood cells carry oxygen through the body. Sickle-shaped cells do not live as long as normal red blood cells. They also clump together and block blood from flowing through the blood vessels. This prevents the body from getting enough oxygen. Sickle cell anemia causes organ damage and pain. It also increases the risk of infection. ?Follow these instructions at home: ?Medicines ?Take over-the-counter and prescription medicines only as told by your doctor. ?If you were prescribed an antibiotic medicine, take it as told by your doctor. Do not stop taking the antibiotic even if you start to feel better. ?If you develop a fever, do not take medicines to lower the fever right away. Tell your doctor about the fever. ?Managing pain, stiffness, and swelling ?Try these methods to help with pain: ?Use a heating pad. ?Take a warm bath. ?Distract yourself, such as by watching TV. ?Eating and drinking ?Drink enough fluid to keep your pee (urine) clear or pale yellow. Drink more in hot weather and during exercise. ?Limit or avoid alcohol. ?Eat a healthy diet. Eat plenty of fruits, vegetables, whole grains, and lean protein. ?Take vitamins and supplements as told by your doctor. ?Traveling ?When traveling, keep these with you: ?Your medical information. ?The names of your doctors. ?Your medicines. ?If you need to take an airplane, talk to your doctor first. ?Activity ?Rest often. ?Avoid exercises that make your heart beat much faster, such as jogging. ?General instructions ?Do not use products that have nicotine or tobacco, such as cigarettes and e-cigarettes. If you need help quitting, ask your doctor. ?Consider wearing a medical alert bracelet. ?Avoid being in high places (high altitudes), such as mountains. ?Avoid very hot or cold temperatures. ?Avoid places where the temperature changes a lot. ?Keep all follow-up  visits as told by your doctor. This is important. ?Contact a doctor if: ?A joint hurts. ?Your feet or hands hurt or swell. ?You feel tired (fatigued). ?Get help right away if: ?You have symptoms of infection. These include: ?Fever. ?Chills. ?Being very tired. ?Irritability. ?Poor eating. ?Throwing up (vomiting). ?You feel dizzy or faint. ?You have new stomach pain, especially on the left side. ?You have a an erection (priapism) that lasts more than 4 hours. ?You have numbness in your arms or legs. ?You have a hard time moving your arms or legs. ?You have trouble talking. ?You have pain that does not go away when you take medicine. ?You are short of breath. ?You are breathing fast. ?You have a long-term cough. ?You have pain in your chest. ?You have a bad headache. ?You have a stiff neck. ?Your stomach looks bloated even though you did not eat much. ?Your skin is pale. ?You suddenly cannot see well. ?Summary ?Sickle cell anemia is a condition where your red blood cells are shaped like sickles. ?Follow your doctor's advice on ways to manage pain, food to eat, activities to do, and steps to take for safe travel. ?Get medical help right away if you have any signs of infection, such as a fever. ?This information is not intended to replace advice given to you by your health care provider. Make sure you discuss any questions you have with your health care provider. ?Document Revised: 12/06/2019 Document Reviewed: 12/06/2019 ?Elsevier Patient Education ? 2022 [employer] ? ?

## 2021-01-15 ENCOUNTER — Encounter: Payer: Self-pay | Admitting: Nurse Practitioner

## 2021-01-19 ENCOUNTER — Telehealth: Payer: Self-pay | Admitting: Podiatry

## 2021-01-19 ENCOUNTER — Encounter: Payer: Self-pay | Admitting: Podiatry

## 2021-01-19 NOTE — Telephone Encounter (Signed)
Is it ok to extend leave for Austin [name]. He saw his sickle cell Doctor and she said that the healing process may be taking longer due to his Sickle Cell, he will also be starting PT 01/20/2021. How long should I extend

## 2021-01-19 NOTE — Telephone Encounter (Signed)
We extended for 8 weeks, he said he didn't need that long. I sent his information over.

## 2021-01-19 NOTE — Telephone Encounter (Signed)
Okay to extend for up to 6 months if the patient would like. - Dr. Logan Bores

## 2021-01-19 NOTE — Telephone Encounter (Signed)
Thanks

## 2021-01-20 ENCOUNTER — Ambulatory Visit: Payer: 59

## 2021-01-20 ENCOUNTER — Telehealth: Payer: Self-pay | Admitting: Podiatry

## 2021-01-20 NOTE — Telephone Encounter (Signed)
[facility] outpatient therapy called and stated that this patient received a referral from his Sickle cell provider stating that he needed physical therapy-for his left foot sx-  His appointment is at 11:00am today  [phone].

## 2021-01-28 ENCOUNTER — Other Ambulatory Visit: Payer: Self-pay

## 2021-01-28 ENCOUNTER — Ambulatory Visit: Payer: 59 | Attending: Nurse Practitioner | Admitting: Physical Therapy

## 2021-01-28 ENCOUNTER — Encounter: Payer: Self-pay | Admitting: Physical Therapy

## 2021-01-28 ENCOUNTER — Ambulatory Visit (INDEPENDENT_AMBULATORY_CARE_PROVIDER_SITE_OTHER): Payer: 59 | Admitting: Podiatry

## 2021-01-28 DIAGNOSIS — G894 Chronic pain syndrome: Secondary | ICD-10-CM

## 2021-01-28 DIAGNOSIS — M25672 Stiffness of left ankle, not elsewhere classified: Secondary | ICD-10-CM | POA: Diagnosis not present

## 2021-01-28 DIAGNOSIS — R2689 Other abnormalities of gait and mobility: Secondary | ICD-10-CM | POA: Insufficient documentation

## 2021-01-28 DIAGNOSIS — M25572 Pain in left ankle and joints of left foot: Secondary | ICD-10-CM | POA: Diagnosis not present

## 2021-01-28 DIAGNOSIS — R112 Nausea with vomiting, unspecified: Secondary | ICD-10-CM

## 2021-01-28 DIAGNOSIS — M6281 Muscle weakness (generalized): Secondary | ICD-10-CM | POA: Insufficient documentation

## 2021-01-28 DIAGNOSIS — Z9889 Other specified postprocedural states: Secondary | ICD-10-CM

## 2021-01-28 DIAGNOSIS — F1190 Opioid use, unspecified, uncomplicated: Secondary | ICD-10-CM

## 2021-01-28 NOTE — Progress Notes (Signed)
   Subjective:  Patient presents today status post EPF left 60. DOS: 09/14/2020.  Patient states that he is doing very well.  He has not applying any pressure to the foot.  He has been wearing the cam boot and using crutches.  He also takes the oxycodone 10 mg from pain management for his sickle cell anemia  Past Medical History:  Diagnosis Date   Acute maxillary sinusitis    Eczema    Sickle cell anemia (HCC) .      Objective/Physical Exam Neurovascular status intact.  Skin incisions appear to be well coapted and healed. No sign of infectious process noted. No dehiscence. No active bleeding noted. Moderate edema noted to the surgical extremity specifically to the foot.   Assessment: 1. s/p EPF LT.  DOS: 12/25/2020   Plan of Care:  1. Patient was evaluated. 2.  Patient begins physical therapy today at [facility] 3.  Continue Ace wrap daily 4.  Patient may discontinue the knee scooter.  Make a slow transition out of the cam boot into good supportive sneakers 5.  Return to clinic in 6 weeks  Felecia Shelling, DPM [facility]  Dr. Felecia Shelling, DPM    [address] [place], [place] [address]                Office [phone]  Fax [phone]

## 2021-01-28 NOTE — Therapy (Signed)
[facility] [address] [place], [place], [address] Phone: [phone]   Fax:  [phone]  Physical Therapy Evaluation  Patient Details  Name: Austin [name] MRN: [id number] Date of Birth: 1991/01/29 Referring Provider (PT): Thad Rangerrystal King, NP   Encounter Date: 01/28/2021   PT End of Session - 01/28/21 1310     Visit Number 1    Number of Visits 13    Date for PT Re-Evaluation 03/11/21    Authorization Type [facility]    PT Start Time 1223    PT Stop Time 1315    PT Time Calculation (min) 52 min    Activity Tolerance Patient tolerated treatment well;No increased pain    Behavior During Therapy WFL for tasks assessed/performed             Past Medical History:  Diagnosis Date   Acute maxillary sinusitis    Eczema    Sickle cell anemia (HCC) .    Past Surgical History:  Procedure Laterality Date   WISDOM TOOTH EXTRACTION      There were no vitals filed for this visit.    Subjective Assessment - 01/28/21 1228     Subjective Patient reports 7 months of L foot pain, now s/p plantar fasciotomy (12/25/2020). Four weeks and has not been able to walk on it and MD referred to PT.    Pertinent History Sickle cell anemia.    Limitations Standing;Walking;House hold activities    How long can you stand comfortably? Almost always in the boot.    How long can you walk comfortably? Hops to avoid WB, has a knee walker.    Patient Stated Goals Get back to walking without pain.    Currently in Pain? Yes    Pain Score 7    Max= 9/10, Min= 5/10 this week   Pain Location Foot    Pain Orientation Left    Pain Descriptors / Indicators Aching;Throbbing;Sharp    Pain Type Surgical pain    Pain Radiating Towards "numb feeling in great toe and side of foot.    Pain Onset 1 to 4 weeks ago    Pain Frequency Constant    Aggravating Factors  Walking and standing    Pain Relieving Factors Elevation, ice, heated massager, medication.                 [facility] 01/28/21 0001       Assessment   Medical Diagnosis S/P plantar fasciotomy    Referring Provider (PT) Thad Rangerrystal King, NP    Onset Date/Surgical Date 12/25/20    Hand Dominance Left    Next MD Visit TBD - 2 months    Prior Therapy No      Restrictions   Weight Bearing Restrictions Yes    LLE Weight Bearing Weight bearing as tolerated      Balance Screen   Has the patient fallen in the past 6 months No    Has the patient had a decrease in activity level because of a fear of falling?  No    Is the patient reluctant to leave their home because of a fear of falling?  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    Skin Integrity Incisions intact    Focus on Therapeutic Outcomes (FOTO)  29% (62% predicted)      Sensation   Light Touch Impaired Detail    Light Touch Impaired Details Impaired LLE   Decrased sensation L 1st and 2nd toe and lateral foot.     AROM   Right Ankle Dorsiflexion 8    Right Ankle Plantar Flexion 37    Right Ankle Inversion 24    Right Ankle Eversion 10    Left Ankle Dorsiflexion -15    Left Ankle Plantar Flexion 32    Left Ankle Inversion 26    Left Ankle Eversion -6      PROM   Left Ankle Dorsiflexion -13    Left Ankle Plantar Flexion 35      Strength   Right Ankle Dorsiflexion 5/5    Right Ankle Plantar Flexion 5/5    Right Ankle Inversion 5/5    Right Ankle Eversion 5/5    Left Ankle  Dorsiflexion 3-/5    Left Ankle Plantar Flexion 2+/5    Left Ankle Inversion 3+/5    Left Ankle Eversion 3-/5      Palpation   Palpation comment B lateral heel, plantar heel and arch region.                        Objective measurements completed on examination: See above findings.       [facility] Adult PT Treatment/Exercise - 01/28/21 0001       Ankle Exercises: Stretches   Plantar Fascia Stretch 2 reps;30 seconds    Plantar Fascia Stretch Limitations TOWEL long sitting    Other Stretch Toe flex/ext stretch x10ea dir      Ankle Exercises: Seated   Towel Crunch 5 reps    Towel Inversion/Eversion 2 reps    Towel Inversion/Eversion Limitations Bath towel R/L    Other Seated Ankle Exercises DF RED x20reps                    PT Education - 01/28/21 1407     Education Details Results of evaluation, FOTO, POC, HEP    Person(s) Educated Patient    Methods Explanation;Demonstration;Verbal cues;Handout    Comprehension Verbalized understanding;Returned demonstration              PT Short Term Goals - 01/28/21 1428       PT SHORT TERM GOAL #1   Title Patient will be independent with initial HEP for symptom management.    Baseline No exercise program    Time 6    Period Weeks    Status New    Target Date 03/11/21      PT SHORT TERM GOAL #2   Title Patient will fully weight bear on L LE without CAM boot to begin gait training with no more than 5/10 pain.    Baseline WB only in CAM boot for <10 feet with pain >8/10.    Time 3    Period Weeks    Status New    Target Date 02/18/21      PT SHORT TERM GOAL #  3   Title Patient will improve L DF to neutral to allow gait training to progress toward nomalized pattern.    Baseline L DF=-15deg    Time 3    Period Weeks    Status New    Target Date 02/18/21               PT Long Term Goals - 01/28/21 1431       PT LONG TERM GOAL #1   Title Patient will be independent with final HEP and  progression to continue to reduce pain and symptoms after discharge.    Baseline No exercise program    Time 6    Period Weeks    Status New    Target Date 03/11/21      PT LONG TERM GOAL #2   Title FOTO score will increase to 62% as predicted for improved perception of functional abilities.    Baseline 29%    Time 6    Period Weeks    Status New    Target Date 03/11/21      PT LONG TERM GOAL #3   Title Patient will report walking >20 min with no more than 2/10 pain without assistive device to demonstrate short community mobility.    Baseline Using knee scooter for all mobility out of the house and most in the house.    Time 6    Period Weeks    Status New    Target Date 03/11/21      PT LONG TERM GOAL #4   Title Patient will improve L DF to >10deg to allow up/down steps to enter with reciprocal pattern per home environment.    Baseline Step to pattern with CAM boot and incrased pain/difficulty navigating up/down steps to enter at home.    Time 6    Period Weeks    Status New    Target Date 03/11/21                    Plan - 01/28/21 1414     Clinical Impression Statement 30 yo male with h/o sickle cell disorder, 7 month c/o plantar surface pain with walking/standing, referred to OPPT s/p L plantar fasciotomy. Patient presenting to PT evaluation on knee scooter with CAM boot.  Patient with pain weight bearing on L foot in CAM boot, altered gait, decreased ankle and foot AROM/PROM and strength. Patient is motivated to get back to work.  Patient will benefit from skilled PT to address deficits and maximize safe functional mobility and return to independent community mobility.    Personal Factors and Comorbidities Comorbidity 1    Comorbidities Sickle Cell disorder    Examination-Activity Limitations Bathing;Transfers;Squat;Caring for Others;Carry;Stand;Stairs;Locomotion Level    Examination-Participation Restrictions Cleaning;Laundry;Yard Work;Community  Activity;Occupation;Shop    Stability/Clinical Decision Making Stable/Uncomplicated    Clinical Decision Making Low    Rehab Potential Good    PT Frequency 2x / week    PT Duration 6 weeks    PT Treatment/Interventions ADLs/Self Care Home Management;Cryotherapy;Moist Heat;DME Instruction;Gait training;Stair training;Functional mobility training;Therapeutic activities;Therapeutic exercise;Balance training;Neuromuscular re-education;Patient/family education;Manual techniques;Scar mobilization;Passive range of motion;Taping;Vasopneumatic Device    PT Next Visit Plan Measure edema, weight bearing/gait/AD, scar mobs, assess HEP and progress as appropriate.    PT Home Exercise Plan Access Code: [email or web address]    Consulted and Agree with Plan of Care Patient             Patient will benefit from skilled therapeutic intervention in order to improve  the following deficits and impairments:  Abnormal gait, Decreased balance, Decreased endurance, Decreased mobility, Difficulty walking, Impaired sensation, Increased edema, Decreased activity tolerance, Decreased strength, Hypermobility, Impaired flexibility, Pain  Visit Diagnosis: Pain in left ankle and joints of left foot  Stiffness of left ankle, not elsewhere classified  Muscle weakness (generalized)  Other abnormalities of gait and mobility     Problem List Patient Active Problem List   Diagnosis Date Noted   Vitamin D deficiency 11/16/2020   Bilateral lower extremity pain 10/17/2019   Sickle cell anemia without crisis (HCC) 09/20/2019   Chronic pain syndrome 09/20/2019   Nausea 09/20/2019   Chronic, continuous use of opioids 09/20/2019   Medication management 03/20/2019   Bilious vomiting without nausea    Viral gastroenteritis 01/23/2019   Nausea vomiting and diarrhea    Generalized abdominal pain    Hb-SS disease with crisis (HCC)    Dehydration    Sickle cell pain crisis (HCC) 05/18/2018   Acute sickle cell crisis (HCC)  12/15/2017   Thrombocytopenia (HCC) 12/15/2017   Splenomegaly 12/15/2017   Sickle cell anemia with crisis (HCC) 12/15/2017   Spleen enlarged 08/18/2017    Myrla Halsted, PT 01/28/2021, 2:42 PM  [facility] [address] [place], [place], [address] Phone: [phone]   Fax:  [phone]  Name: GRANGER CHUI MRN: [id number] Date of Birth: January 02, 1991

## 2021-01-28 NOTE — Patient Instructions (Signed)
Access Code: [email or web address] URL: [email or web address] Date: 01/28/2021 Prepared by: Myrla Halsted  Exercises Long Sitting Calf Stretch with Strap - 1 x daily - 7 x weekly - 2 sets - 10 reps Long Sitting Ankle Eversion with Resistance - 1 x daily - 7 x weekly - 2 sets - 10 reps Towel Scrunches - 1 x daily - 7 x weekly - 2 sets - 10 reps Ankle Inversion Eversion Towel Slide - 1 x daily - 7 x weekly - 2 sets - 10 reps Seated Toe Flexion Extension PROM - 1 x daily - 7 x weekly - 2 sets - 10 reps

## 2021-01-29 ENCOUNTER — Other Ambulatory Visit: Payer: Self-pay | Admitting: Nurse Practitioner

## 2021-01-29 ENCOUNTER — Ambulatory Visit: Payer: 59

## 2021-01-29 DIAGNOSIS — R112 Nausea with vomiting, unspecified: Secondary | ICD-10-CM

## 2021-01-29 DIAGNOSIS — F1190 Opioid use, unspecified, uncomplicated: Secondary | ICD-10-CM

## 2021-01-29 DIAGNOSIS — G894 Chronic pain syndrome: Secondary | ICD-10-CM

## 2021-01-29 MED ORDER — PROMETHAZINE HCL 25 MG PO TABS: 25.0000 mg | ORAL_TABLET | Freq: Four times a day (QID) | ORAL | 5 refills | Status: DC | PRN

## 2021-01-29 MED ORDER — OXYCODONE HCL 10 MG PO TABS: 10.0000 mg | ORAL_TABLET | Freq: Four times a day (QID) | ORAL | 0 refills | Status: DC | PRN

## 2021-01-29 NOTE — Telephone Encounter (Signed)
Patient coming in for nurse visit today 01/29/2021

## 2021-02-02 ENCOUNTER — Ambulatory Visit: Payer: 59 | Admitting: Physical Therapy

## 2021-02-02 ENCOUNTER — Other Ambulatory Visit: Payer: Self-pay

## 2021-02-02 ENCOUNTER — Encounter: Payer: Self-pay | Admitting: Physical Therapy

## 2021-02-02 DIAGNOSIS — M6281 Muscle weakness (generalized): Secondary | ICD-10-CM | POA: Diagnosis not present

## 2021-02-02 DIAGNOSIS — R2689 Other abnormalities of gait and mobility: Secondary | ICD-10-CM

## 2021-02-02 DIAGNOSIS — M25672 Stiffness of left ankle, not elsewhere classified: Secondary | ICD-10-CM | POA: Diagnosis not present

## 2021-02-02 DIAGNOSIS — M25572 Pain in left ankle and joints of left foot: Secondary | ICD-10-CM

## 2021-02-02 NOTE — Therapy (Signed)
[facility] [address] [place], [place], [address] Phone: [phone]   Fax:  [phone]  Physical Therapy Treatment  Patient Details  Name: Austin [name] MRN: [id number] Date of Birth: September 24, 1990 Referring Provider (PT): Thad Ranger, NP   Encounter Date: 02/02/2021   PT End of Session - 02/02/21 1320     Visit Number 2    Number of Visits 13    Date for PT Re-Evaluation 03/11/21    Authorization Type [facility]    PT Start Time 1225    PT Stop Time 1310    PT Time Calculation (min) 45 min    Activity Tolerance Patient tolerated treatment well;No increased pain    Behavior During Therapy WFL for tasks assessed/performed             Past Medical History:  Diagnosis Date   Acute maxillary sinusitis    Eczema    Sickle cell anemia (HCC) .    Past Surgical History:  Procedure Laterality Date   WISDOM TOOTH EXTRACTION      There were no vitals filed for this visit.   Subjective Assessment - 02/02/21 1227     Subjective Patient reports he is sore from stepping on his son's monster truck with L foot without his boot on. Patient reports he "did a little bit" of the home work.  Patient reports min compliance with HEP.    Limitations Standing;Walking;House hold activities    Patient Stated Goals Get back to walking without pain.    Currently in Pain? Yes    Pain Score 7    Min=3/10 when resting.   Pain Location Foot    Pain Orientation Left                [facility] PT Assessment - 02/02/21 0001       Assessment   Medical Diagnosis S/P plantar fasciotomy    Referring Provider (PT) Thad Ranger, NP    Onset Date/Surgical Date 12/25/20    Hand Dominance Left    Next MD Visit TBD - 2 months    Prior Therapy No      Restrictions   Weight Bearing Restrictions Yes    LLE Weight Bearing Weight bearing as tolerated      Observation/Other Assessments-Edema    Edema Figure 8      Figure 8 Edema   Figure 8 -  Left  54cm                           [facility] Adult PT Treatment/Exercise - 02/02/21 0001       Ambulation/Gait   Pre-Gait Activities Weight shift L/R, R heel raise with weight shift, step fwd/bk over 1" object initiating with knee flex, land on heel, weight shift onto Left.      Manual Therapy   Manual Therapy Soft tissue mobilization;Joint mobilization;Passive ROM    Joint Mobilization Grade III/IV ant drawer, transverse rotation, AP/PA mets    Soft tissue mobilization STM plantar surface and lateral ankle    Passive ROM DF and toe DF/PF      Ankle Exercises: Stretches   Plantar Fascia Stretch 2 reps;30 seconds    Plantar Fascia Stretch Limitations Strap                    PT Education - 02/02/21 1310     Education Details Addition of standing exercises for improve ankle reaction and balance.  Person(s) Educated Patient    Methods Explanation;Demonstration;Verbal cues;Handout    Comprehension Verbalized understanding              PT Short Term Goals - 01/28/21 1428       PT SHORT TERM GOAL #1   Title Patient will be independent with initial HEP for symptom management.    Baseline No exercise program    Time 6    Period Weeks    Status New    Target Date 03/11/21      PT SHORT TERM GOAL #2   Title Patient will fully weight bear on L LE without CAM boot to begin gait training with no more than 5/10 pain.    Baseline WB only in CAM boot for <10 feet with pain >8/10.    Time 3    Period Weeks    Status New    Target Date 02/18/21      PT SHORT TERM GOAL #3   Title Patient will improve L DF to neutral to allow gait training to progress toward nomalized pattern.    Baseline L DF=-15deg    Time 3    Period Weeks    Status New    Target Date 02/18/21               PT Long Term Goals - 01/28/21 1431       PT LONG TERM GOAL #1   Title Patient will be independent with final HEP and progression to continue to reduce pain and  symptoms after discharge.    Baseline No exercise program    Time 6    Period Weeks    Status New    Target Date 03/11/21      PT LONG TERM GOAL #2   Title FOTO score will increase to 62% as predicted for improved perception of functional abilities.    Baseline 29%    Time 6    Period Weeks    Status New    Target Date 03/11/21      PT LONG TERM GOAL #3   Title Patient will report walking >20 min with no more than 2/10 pain without assistive device to demonstrate short community mobility.    Baseline Using knee scooter for all mobility out of the house and most in the house.    Time 6    Period Weeks    Status New    Target Date 03/11/21      PT LONG TERM GOAL #4   Title Patient will improve L DF to >10deg to allow up/down steps to enter with reciprocal pattern per home environment.    Baseline Step to pattern with CAM boot and incrased pain/difficulty navigating up/down steps to enter at home.    Time 6    Period Weeks    Status New    Target Date 03/11/21                   Plan - 02/02/21 1321     Clinical Impression Statement Patient 10 min late for treatment, pt forgot to bring tennis shoe.  Patient able to stand and perform short gait in bars with attention to pattern.  Patient continues to be challenged by SLS balance and full weight bearing with ambulation. Patient will benefit from continued skilled PT to address deficits and maximize functional mobility and return to work.    Comorbidities Sickle Cell disorder    Examination-Activity Limitations Bathing;Transfers;Squat;Caring for Others;Carry;Stand;Stairs;Locomotion Level  PT Treatment/Interventions ADLs/Self Care Home Management;Cryotherapy;Moist Heat;DME Instruction;Gait training;Stair training;Functional mobility training;Therapeutic activities;Therapeutic exercise;Balance training;Neuromuscular re-education;Patient/family education;Manual techniques;Scar mobilization;Passive range of  motion;Taping;Vasopneumatic Device    PT Next Visit Plan Progress weight bearing/gait/AD, dynamic balance, scar mobs, assess HEP and progress as appropriate.    PT Home Exercise Plan Access Code: [email or web address]    Consulted and Agree with Plan of Care Patient             Patient will benefit from skilled therapeutic intervention in order to improve the following deficits and impairments:  Abnormal gait, Decreased balance, Decreased endurance, Decreased mobility, Difficulty walking, Impaired sensation, Increased edema, Decreased activity tolerance, Decreased strength, Hypermobility, Impaired flexibility, Pain  Visit Diagnosis: Pain in left ankle and joints of left foot  Stiffness of left ankle, not elsewhere classified  Muscle weakness (generalized)  Other abnormalities of gait and mobility     Problem List Patient Active Problem List   Diagnosis Date Noted   Vitamin D deficiency 11/16/2020   Bilateral lower extremity pain 10/17/2019   Sickle cell anemia without crisis (HCC) 09/20/2019   Chronic pain syndrome 09/20/2019   Nausea 09/20/2019   Chronic, continuous use of opioids 09/20/2019   Medication management 03/20/2019   Bilious vomiting without nausea    Viral gastroenteritis 01/23/2019   Nausea vomiting and diarrhea    Generalized abdominal pain    Hb-SS disease with crisis (HCC)    Dehydration    Sickle cell pain crisis (HCC) 05/18/2018   Acute sickle cell crisis (HCC) 12/15/2017   Thrombocytopenia (HCC) 12/15/2017   Splenomegaly 12/15/2017   Sickle cell anemia with crisis (HCC) 12/15/2017   Spleen enlarged 08/18/2017    Myrla Halsted, PT 02/02/2021, 1:26 PM  [facility] [address] [place], [place], [address] Phone: [phone]   Fax:  [phone]  Name: Austin [name] MRN: [id number] Date of Birth: Jul 31, 1990

## 2021-02-02 NOTE — Patient Instructions (Signed)
Access Code: [email or web address] URL: [email or web address] Date: 02/02/2021 Prepared by: Myrla Halsted  NEW Exercises  Single Leg Stance - 2 x daily - 7 x weekly - 3 sets - 30sec hold Standing Heel Raise with Support - 2 x daily - 7 x weekly - 3 sets - 10 reps Single Leg Balance with Clock Reach - 2 x daily - 7 x weekly - 3 sets - 10 reps

## 2021-02-05 ENCOUNTER — Ambulatory Visit: Payer: 59 | Admitting: Physical Therapy

## 2021-02-05 ENCOUNTER — Encounter: Payer: Self-pay | Admitting: Physical Therapy

## 2021-02-05 ENCOUNTER — Other Ambulatory Visit: Payer: Self-pay

## 2021-02-05 DIAGNOSIS — M25572 Pain in left ankle and joints of left foot: Secondary | ICD-10-CM

## 2021-02-05 DIAGNOSIS — R2689 Other abnormalities of gait and mobility: Secondary | ICD-10-CM

## 2021-02-05 DIAGNOSIS — M25672 Stiffness of left ankle, not elsewhere classified: Secondary | ICD-10-CM

## 2021-02-05 DIAGNOSIS — M6281 Muscle weakness (generalized): Secondary | ICD-10-CM

## 2021-02-05 NOTE — Therapy (Signed)
[facility] [address] [place], [place], [address] Phone: [phone]   Fax:  [phone]  Physical Therapy Treatment  Patient Details  Name: Austin [name] MRN: [id number] Date of Birth: May 02, 1991 Referring Provider (PT): Thad Ranger, NP   Encounter Date: 02/05/2021   PT End of Session - 02/05/21 1615     Visit Number 3    Number of Visits 13    Date for PT Re-Evaluation 03/11/21    Authorization Type [facility]    PT Start Time 1537    PT Stop Time 1616    PT Time Calculation (min) 39 min    Activity Tolerance Patient tolerated treatment well;No increased pain    Behavior During Therapy WFL for tasks assessed/performed             Past Medical History:  Diagnosis Date   Acute maxillary sinusitis    Eczema    Sickle cell anemia (HCC) .    Past Surgical History:  Procedure Laterality Date   WISDOM TOOTH EXTRACTION      There were no vitals filed for this visit.   Subjective Assessment - 02/05/21 1538     Subjective Patient reports good compliance with HEP. Patient reports pain when bearfoot, compared to wearing a sock.    Pertinent History Sickle cell anemia.    Limitations Standing;Walking;House hold activities    Patient Stated Goals Get back to walking without pain.    Currently in Pain? Yes    Pain Score 5     Pain Location Foot    Pain Orientation Left    Pain Descriptors / Indicators Aching;Sharp;Throbbing    Pain Type Surgical pain                [facility] PT Assessment - 02/05/21 0001       Assessment   Medical Diagnosis S/P plantar fasciotomy    Referring Provider (PT) Thad Ranger, NP    Onset Date/Surgical Date 12/25/20    Hand Dominance Left    Next MD Visit TBD - 2 months    Prior Therapy No      Restrictions   Weight Bearing Restrictions Yes    LLE Weight Bearing Weight bearing as tolerated                           [facility] Adult PT Treatment/Exercise -  02/05/21 0001       Knee/Hip Exercises: Standing   Lateral Step Up Step Height: 6";10 reps    Step Down Step Height: 6";10 reps    Functional Squat 20 reps    Functional Squat Limitations on AIREX    SLS 2x10sec with some instability, no UE support      Ankle Exercises: Aerobic   Recumbent Bike L4x72min      Ankle Exercises: Standing   Rocker Board 1 minute    Rocker Board Limitations FWD/BK, R/L    Heel Raises 20 reps      Ankle Exercises: Stretches   Restaurant manager, fast food 30 seconds;2 reps   Gast and Sol                   PT Education - 02/05/21 1614     Education Details Continue HEP    Person(s) Educated Patient    Methods Explanation;Demonstration;Verbal cues    Comprehension Verbalized understanding;Returned demonstration              PT Short Term  Goals - 02/05/21 1625       PT SHORT TERM GOAL #1   Title Patient will be independent with initial HEP for symptom management.    Status Achieved      PT SHORT TERM GOAL #2   Title Patient will fully weight bear on L LE without CAM boot to begin gait training with no more than 5/10 pain.    Baseline WB only in CAM boot for <10 feet with pain >8/10.    Status Achieved               PT Long Term Goals - 01/28/21 1431       PT LONG TERM GOAL #1   Title Patient will be independent with final HEP and progression to continue to reduce pain and symptoms after discharge.    Baseline No exercise program    Time 6    Period Weeks    Status New    Target Date 03/11/21      PT LONG TERM GOAL #2   Title FOTO score will increase to 62% as predicted for improved perception of functional abilities.    Baseline 29%    Time 6    Period Weeks    Status New    Target Date 03/11/21      PT LONG TERM GOAL #3   Title Patient will report walking >20 min with no more than 2/10 pain without assistive device to demonstrate short community mobility.    Baseline Using knee scooter for all mobility out of the  house and most in the house.    Time 6    Period Weeks    Status New    Target Date 03/11/21      PT LONG TERM GOAL #4   Title Patient will improve L DF to >10deg to allow up/down steps to enter with reciprocal pattern per home environment.    Baseline Step to pattern with CAM boot and incrased pain/difficulty navigating up/down steps to enter at home.    Time 6    Period Weeks    Status New    Target Date 03/11/21                   Plan - 02/05/21 1615     Clinical Impression Statement Patient 7 min late for treatment, patient arrived in sneakers today. Patient ambulating with decreased pace, toe out, likely baseline as this is bilateral.  Patient significant improvement in SLS, full weight bearing x10sec without UE support.  Patient is ready for higher level standing activity. Patient will benefit from continued skilled PT to address deficits and maximize return to full and active independent community access.    Comorbidities Sickle Cell disorder    Examination-Activity Limitations Bathing;Transfers;Squat;Caring for Others;Carry;Stand;Stairs;Locomotion Level    Examination-Participation Restrictions Cleaning;Laundry;Yard Work;Community Activity;Occupation;Shop    PT Treatment/Interventions ADLs/Self Care Home Management;Cryotherapy;Moist Heat;DME Instruction;Gait training;Stair training;Functional mobility training;Therapeutic activities;Therapeutic exercise;Balance training;Neuromuscular re-education;Patient/family education;Manual techniques;Scar mobilization;Passive range of motion;Taping;Vasopneumatic Device    PT Next Visit Plan Progress weight bearing/gait/AD, dynamic balance, assess HEP and PROGRESS next visit. Floor ladder, check stairs, ankle ROM.    PT Home Exercise Plan Access Code: [email or web address]    Consulted and Agree with Plan of Care Patient             Patient will benefit from skilled therapeutic intervention in order to improve the following deficits and  impairments:  Abnormal gait, Decreased balance, Decreased endurance, Decreased mobility, Difficulty walking, Impaired sensation,  Increased edema, Decreased activity tolerance, Decreased strength, Hypermobility, Impaired flexibility, Pain  Visit Diagnosis: Pain in left ankle and joints of left foot  Stiffness of left ankle, not elsewhere classified  Muscle weakness (generalized)  Other abnormalities of gait and mobility     Problem List Patient Active Problem List   Diagnosis Date Noted   Vitamin D deficiency 11/16/2020   Bilateral lower extremity pain 10/17/2019   Sickle cell anemia without crisis (HCC) 09/20/2019   Chronic pain syndrome 09/20/2019   Nausea 09/20/2019   Chronic, continuous use of opioids 09/20/2019   Medication management 03/20/2019   Bilious vomiting without nausea    Viral gastroenteritis 01/23/2019   Nausea vomiting and diarrhea    Generalized abdominal pain    Hb-SS disease with crisis (HCC)    Dehydration    Sickle cell pain crisis (HCC) 05/18/2018   Acute sickle cell crisis (HCC) 12/15/2017   Thrombocytopenia (HCC) 12/15/2017   Splenomegaly 12/15/2017   Sickle cell anemia with crisis (HCC) 12/15/2017   Spleen enlarged 08/18/2017    Myrla Halsted, PT 02/05/2021, 4:30 PM  [facility] [address] [place], [place], [address] Phone: [phone]   Fax:  [phone]  Name: Austin [name] MRN: [id number] Date of Birth: 1991/02/04

## 2021-02-09 ENCOUNTER — Ambulatory Visit: Payer: 59 | Admitting: Physical Therapy

## 2021-02-09 ENCOUNTER — Emergency Department (HOSPITAL_COMMUNITY)
Admission: EM | Admit: 2021-02-09 | Discharge: 2021-02-09 | Disposition: A | Discharge status: Home / Self Care | Payer: 59 | Attending: Emergency Medicine | Admitting: Emergency Medicine

## 2021-02-09 ENCOUNTER — Other Ambulatory Visit: Payer: Self-pay | Admitting: Nurse Practitioner

## 2021-02-09 ENCOUNTER — Other Ambulatory Visit: Payer: Self-pay

## 2021-02-09 ENCOUNTER — Encounter (HOSPITAL_COMMUNITY): Payer: Self-pay

## 2021-02-09 DIAGNOSIS — R519 Headache, unspecified: Secondary | ICD-10-CM | POA: Insufficient documentation

## 2021-02-09 DIAGNOSIS — R5383 Other fatigue: Secondary | ICD-10-CM | POA: Insufficient documentation

## 2021-02-09 DIAGNOSIS — R197 Diarrhea, unspecified: Secondary | ICD-10-CM | POA: Diagnosis not present

## 2021-02-09 DIAGNOSIS — R112 Nausea with vomiting, unspecified: Secondary | ICD-10-CM | POA: Insufficient documentation

## 2021-02-09 DIAGNOSIS — Z87891 Personal history of nicotine dependence: Secondary | ICD-10-CM | POA: Diagnosis not present

## 2021-02-09 DIAGNOSIS — D5700 Hb-SS disease with crisis, unspecified: Secondary | ICD-10-CM | POA: Diagnosis not present

## 2021-02-09 DIAGNOSIS — R6883 Chills (without fever): Secondary | ICD-10-CM | POA: Insufficient documentation

## 2021-02-09 LAB — COMPREHENSIVE METABOLIC PANEL
ALT: 34 U/L (ref 0–44)
AST: 17 U/L (ref 15–41)
Albumin: 5.1 g/dL — ABNORMAL HIGH (ref 3.5–5.0)
Alkaline Phosphatase: 60 U/L (ref 38–126)
Anion gap: 13 (ref 5–15)
BUN: 8 mg/dL (ref 6–20)
CO2: 22 mmol/L (ref 22–32)
Calcium: 10.1 mg/dL (ref 8.9–10.3)
Chloride: 104 mmol/L (ref 98–111)
Creatinine, Ser: 0.83 mg/dL (ref 0.61–1.24)
GFR, Estimated: >60 mL/min (ref >60)
Glucose, Bld: 121 mg/dL — ABNORMAL HIGH (ref 70–99)
Potassium: 3.8 mmol/L (ref 3.5–5.1)
Sodium: 139 mmol/L (ref 135–145)
Total Bilirubin: 2 mg/dL — ABNORMAL HIGH (ref 0.3–1.2)
Total Protein: 9.2 g/dL — ABNORMAL HIGH (ref 6.5–8.1)

## 2021-02-09 LAB — CBC WITH DIFFERENTIAL/PLATELET
Abs Immature Granulocytes: 0.07 10*3/uL (ref 0.00–0.07)
Basophils Absolute: 0 10*3/uL (ref 0.0–0.1)
Basophils Relative: 0 %
Eosinophils Absolute: 0.1 10*3/uL (ref 0.0–0.5)
Eosinophils Relative: 1 %
HCT: 39.9 % (ref 39.0–52.0)
Hemoglobin: 14.3 g/dL (ref 13.0–17.0)
Immature Granulocytes: 1 %
Lymphocytes Relative: 20 %
Lymphs Abs: 2.4 10*3/uL (ref 0.7–4.0)
MCH: 28.7 pg (ref 26.0–34.0)
MCHC: 35.8 g/dL (ref 30.0–36.0)
MCV: 80.1 fL (ref 80.0–100.0)
Monocytes Absolute: 1 10*3/uL (ref 0.1–1.0)
Monocytes Relative: 8 %
Neutro Abs: 8.2 10*3/uL — ABNORMAL HIGH (ref 1.7–7.7)
Neutrophils Relative %: 70 %
Platelets: 162 10*3/uL (ref 150–400)
RBC: 4.98 MIL/uL (ref 4.22–5.81)
RDW: 15.9 % — ABNORMAL HIGH (ref 11.5–15.5)
WBC: 11.9 10*3/uL — ABNORMAL HIGH (ref 4.0–10.5)
nRBC: 0.4 % — ABNORMAL HIGH (ref 0.0–0.2)

## 2021-02-09 LAB — RETICULOCYTES
Immature Retic Fract: 38.7 % — ABNORMAL HIGH (ref 2.3–15.9)
RBC.: 4.87 MIL/uL (ref 4.22–5.81)
Retic Count, Absolute: 233.8 10*3/uL — ABNORMAL HIGH (ref 19.0–186.0)
Retic Ct Pct: 4.8 % — ABNORMAL HIGH (ref 0.4–3.1)

## 2021-02-09 MED ORDER — KETOROLAC TROMETHAMINE 15 MG/ML IJ SOLN
15.0000 mg | INTRAMUSCULAR | Status: AC
  Administered 2021-02-09: 15 mg via INTRAVENOUS
  Filled 2021-02-09: qty 1

## 2021-02-09 MED ORDER — DEXTROSE-NACL 5-0.45 % IV SOLN: INTRAVENOUS | Status: DC

## 2021-02-09 MED ORDER — AMOXICILLIN 500 MG PO TABS: 500.0000 mg | ORAL_TABLET | Freq: Two times a day (BID) | ORAL | 0 refills | Status: AC

## 2021-02-09 MED ORDER — LIDOCAINE 5 % EX PTCH: 1.0000 | MEDICATED_PATCH | CUTANEOUS | 0 refills | Status: DC

## 2021-02-09 MED ORDER — HYDROMORPHONE HCL 2 MG/ML IJ SOLN
2.0000 mg | INTRAMUSCULAR | Status: AC
  Administered 2021-02-09: 2 mg via INTRAVENOUS
  Filled 2021-02-09: qty 1

## 2021-02-09 MED ORDER — ONDANSETRON HCL 4 MG/2ML IJ SOLN
4.0000 mg | INTRAMUSCULAR | Status: DC | PRN
  Administered 2021-02-09: 4 mg via INTRAVENOUS
  Filled 2021-02-09: qty 2

## 2021-02-09 NOTE — ED Notes (Signed)
Pt was given ice water and diet ginger ale at this time.

## 2021-02-09 NOTE — Discharge Instructions (Addendum)
You came to the ED with back pain and nausea and vomiting appearing to be in a sickle cell crisis. You were treated for nausea and pain and they both have resolved. I would recommend following up with PCP within 1-2 weeks.

## 2021-02-09 NOTE — ED Provider Notes (Signed)
[facility]-EMERGENCY DEPT Provider Note   CSN: [id number] Arrival date & time: 02/09/21  0734     History Chief Complaint  Patient presents with   Sickle Cell Pain Crisis   Emesis   Chills    Austin [name] is a 30 y.o. male with PMH of sickle cell anemia, Vitamin D deficiency, presenting to ED with nausea and vomiting along with lower back pain. He states the symptoms started this morning and are progressively getting worse. He denied any SOB, CP, or abdominal pain. He endorsed chills, and periodic headaches. He has difficulty characterizing the nature of his pain and states it is 8/10 but started off 5/10. The back pain is chronic issue for him that last flared in May 2022. Last sickle cell crisis documented on 04/2021 but patient endorsed getting around 5 crises per year.   The history is provided by the patient.  Sickle Cell Pain Crisis Associated symptoms: fatigue, headaches, nausea and vomiting   Associated symptoms: no chest pain, no cough, no fever, no shortness of breath and no sore throat   Emesis Associated symptoms: chills, diarrhea and headaches   Associated symptoms: no abdominal pain, no arthralgias, no cough, no fever, no myalgias and no sore throat       Past Medical History:  Diagnosis Date   Acute maxillary sinusitis    Eczema    Sickle cell anemia (HCC) .    Patient Active Problem List   Diagnosis Date Noted   Vitamin D deficiency 11/16/2020   Bilateral lower extremity pain 10/17/2019   Sickle cell anemia without crisis (HCC) 09/20/2019   Chronic pain syndrome 09/20/2019   Nausea 09/20/2019   Chronic, continuous use of opioids 09/20/2019   Medication management 03/20/2019   Bilious vomiting without nausea    Viral gastroenteritis 01/23/2019   Nausea vomiting and diarrhea    Generalized abdominal pain    Hb-SS disease with crisis (HCC)    Dehydration    Sickle cell pain crisis (HCC) 05/18/2018   Acute sickle cell crisis  (HCC) 12/15/2017   Thrombocytopenia (HCC) 12/15/2017   Splenomegaly 12/15/2017   Sickle cell anemia with crisis (HCC) 12/15/2017   Spleen enlarged 08/18/2017    Past Surgical History:  Procedure Laterality Date   FOOT SURGERY Left 12/25/2020   WISDOM TOOTH EXTRACTION         Family History  Problem Relation Age of Onset   Sickle cell trait Mother    Diabetes Father    Hypertension Father     Social History   Tobacco Use   Smoking status: Former    Packs/day: 0.10    Types: Cigarettes   Smokeless tobacco: Never  Vaping Use   Vaping Use: Never used  Substance Use Topics   Alcohol use: Yes    Comment: occ   Drug use: Yes    Types: Marijuana    Home Medications Prior to Admission medications   Medication Sig Start Date End Date Taking? Authorizing Provider  ibuprofen (ADVIL) 800 MG tablet Take 1 tablet (800 mg total) by mouth every 8 (eight) hours as needed. Patient taking differently: Take 800 mg by mouth every 8 (eight) hours as needed for mild pain. 01/14/21  Yes Barbette Merino, NP  loratadine (CLARITIN) 10 MG tablet Take 1 tablet (10 mg total) by mouth daily. Patient taking differently: Take 10 mg by mouth daily as needed for allergies. 11/13/20  Yes Barbette Merino, NP  Olopatadine HCl 0.2 % SOLN Apply  1 drop to eye in the morning and at bedtime. Patient taking differently: Apply 1 drop to eye 2 (two) times daily as needed (allergy symptoms). 11/13/20 11/13/21 Yes Barbette MerinoKing, Crystal M, NP  Oxycodone HCl 10 MG TABS Take 1 tablet (10 mg total) by mouth 4 (four) times daily as needed. 01/29/21  Yes Barbette MerinoKing, Crystal M, NP  promethazine (PHENERGAN) 25 MG tablet Take 1 tablet (25 mg total) by mouth every 6 (six) hours as needed for nausea or vomiting. 01/29/21  Yes Barbette MerinoKing, Crystal M, NP  amoxicillin (AMOXIL) 500 MG tablet Take 1 tablet (500 mg total) by mouth 2 (two) times daily for 10 days. 02/09/21 02/19/21  Barbette MerinoKing, Crystal M, NP  ergocalciferol (VITAMIN D2) 1.25 MG (50000 UT) capsule Take  1 capsule (50,000 Units total) by mouth once a week. X 12 weeks. Patient not taking: Reported on 02/09/2021 11/16/20 02/14/21  Barbette MerinoKing, Crystal M, NP    Allergies    Patient has no known allergies.  Review of Systems   Review of Systems  Constitutional:  Positive for chills and fatigue. Negative for fever.  HENT:  Negative for ear pain and sore throat.   Eyes:  Negative for pain and visual disturbance.  Respiratory:  Negative for cough and shortness of breath.   Cardiovascular:  Negative for chest pain and palpitations.  Gastrointestinal:  Positive for diarrhea, nausea and vomiting. Negative for abdominal pain.  Genitourinary:  Negative for dysuria and hematuria.  Musculoskeletal:  Positive for back pain. Negative for arthralgias, myalgias, neck pain and neck stiffness.  Skin:  Negative for color change and rash.  Neurological:  Positive for headaches. Negative for seizures and syncope.  Psychiatric/Behavioral:  Negative for agitation and behavioral problems.   All other systems reviewed and are negative.  Physical Exam Updated Vital Signs BP 130/88   Pulse 94   Temp (!) 97.5 F (36.4 C) (Oral)   Resp 16   Ht 5\' 7"  (1.702 m)   Wt 86.2 kg   SpO2 100%   BMI 29.76 kg/m   Physical Exam Vitals and nursing note reviewed.  Constitutional:      General: He is in acute distress.     Appearance: He is well-developed.  HENT:     Head: Normocephalic and atraumatic.     Right Ear: External ear normal.     Left Ear: External ear normal.     Mouth/Throat:     Mouth: Mucous membranes are moist.     Pharynx: Oropharynx is clear. No oropharyngeal exudate or posterior oropharyngeal erythema.  Eyes:     Conjunctiva/sclera: Conjunctivae normal.     Pupils: Pupils are equal, round, and reactive to light.  Cardiovascular:     Rate and Rhythm: Normal rate and regular rhythm.     Heart sounds: No murmur heard. Pulmonary:     Effort: Pulmonary effort is normal. No respiratory distress.      Breath sounds: Normal breath sounds.  Abdominal:     General: Bowel sounds are normal. There is no distension.     Palpations: Abdomen is soft. There is no mass.     Tenderness: There is no abdominal tenderness.  Musculoskeletal:        General: Tenderness (low back pain that improves with palpation) present. No swelling or signs of injury. Normal range of motion.     Cervical back: Normal range of motion and neck supple.  Skin:    General: Skin is warm and dry.     Capillary Refill:  Capillary refill takes less than 2 seconds.     Findings: No bruising, erythema, lesion or rash.  Neurological:     General: No focal deficit present.     Mental Status: He is alert and oriented to person, place, and time.  Psychiatric:        Mood and Affect: Mood normal.        Behavior: Behavior normal.    ED Results / Procedures / Treatments   Labs (all labs ordered are listed, but only abnormal results are displayed) Labs Reviewed  COMPREHENSIVE METABOLIC PANEL - Abnormal; Notable for the following components:      Result Value   Glucose, Bld 121 (*)    Total Protein 9.2 (*)    Albumin 5.1 (*)    Total Bilirubin 2.0 (*)    All other components within normal limits  CBC WITH DIFFERENTIAL/PLATELET - Abnormal; Notable for the following components:   WBC 11.9 (*)    RDW 15.9 (*)    nRBC 0.4 (*)    Neutro Abs 8.2 (*)    All other components within normal limits  RETICULOCYTES - Abnormal; Notable for the following components:   Retic Ct Pct 4.8 (*)    Retic Count, Absolute 233.8 (*)    Immature Retic Fract 38.7 (*)    All other components within normal limits    EKG None  Radiology No results found.  Procedures Procedures   Medications Ordered in ED Medications  dextrose 5 %-0.45 % sodium chloride infusion ( Intravenous New Bag/Given 02/09/21 0915)  ondansetron (ZOFRAN) injection 4 mg (4 mg Intravenous Given 02/09/21 0911)  ketorolac (TORADOL) 15 MG/ML injection 15 mg (15 mg  Intravenous Given 02/09/21 0912)  HYDROmorphone (DILAUDID) injection 2 mg (2 mg Intravenous Given 02/09/21 0913)  HYDROmorphone (DILAUDID) injection 2 mg (2 mg Intravenous Given 02/09/21 1035)    ED Course  I have reviewed the triage vital signs and the nursing notes.  Pertinent labs & imaging results that were available during my care of the patient were reviewed by me and considered in my medical decision making (see chart for details).  Clinical Course as of 02/09/21 1502  Mon Feb 09, 2021  0840 Sickle cell order set initiated for this patient [GK]  1100 Re-evaluated patient, patient do well and moving around. States he is ready for discharge [GK]    Clinical Course User Index [GK] Gwenevere Abbot, MD   MDM Rules/Calculators/A&P                          Low back pain/Nausea/Vomiting Patient presented to the ED with nausea and vomiting along with low back pain. He endorsed chills but no fever. Ddx include sickle cell crises vs gastroenteritis vs COVID vs pneumonia. Most likely appears to be sickle cell crises because patient endorsed similar pain to that. Patient also stated he gets around 5 crises per year. Could be gastroenteritis due to nausea/vomiting and diarrhea but he does not have any fever. He also ate same food as his family members and they are not sick. COVID and pneumonia are less likely as patient denies any myalgias, sob, cough or fever. Patient feeling at baseline after getting IV meds. Will prescribe lidoderm patch if low back pain recurs to avoid opioid use.   Plan: -Initiate Sickle Cell Crisis protocol (2 mg Dilaudid, Toradol 15 mg, and Zofran 4 mg) -CBC, Retic, CMP -Lidoderm 5% patch -Follow up with PCP  Final Clinical Impression(s) /  ED Diagnoses Final diagnoses:  None    Rx / DC Orders ED Discharge Orders     None        Gwenevere Abbot, MD 02/09/21 1505    Rolan Bucco, MD 02/09/21 [phone]

## 2021-02-09 NOTE — ED Triage Notes (Signed)
Patient c/o sickle cell pain in his lower back this AM.  Patient also c/o emesis and chills since this AM.

## 2021-02-10 ENCOUNTER — Other Ambulatory Visit: Payer: Self-pay

## 2021-02-10 ENCOUNTER — Encounter (HOSPITAL_COMMUNITY): Payer: Self-pay

## 2021-02-10 ENCOUNTER — Emergency Department (HOSPITAL_COMMUNITY)
Admission: EM | Admit: 2021-02-10 | Discharge: 2021-02-10 | Disposition: A | Discharge status: Home / Self Care | Payer: 59 | Attending: Emergency Medicine | Admitting: Emergency Medicine

## 2021-02-10 DIAGNOSIS — E860 Dehydration: Secondary | ICD-10-CM | POA: Insufficient documentation

## 2021-02-10 DIAGNOSIS — R197 Diarrhea, unspecified: Secondary | ICD-10-CM | POA: Insufficient documentation

## 2021-02-10 DIAGNOSIS — Z87891 Personal history of nicotine dependence: Secondary | ICD-10-CM | POA: Diagnosis not present

## 2021-02-10 DIAGNOSIS — R112 Nausea with vomiting, unspecified: Secondary | ICD-10-CM | POA: Diagnosis not present

## 2021-02-10 DIAGNOSIS — D72829 Elevated white blood cell count, unspecified: Secondary | ICD-10-CM | POA: Diagnosis not present

## 2021-02-10 DIAGNOSIS — Z20822 Contact with and (suspected) exposure to covid-19: Secondary | ICD-10-CM | POA: Insufficient documentation

## 2021-02-10 LAB — COMPREHENSIVE METABOLIC PANEL
ALT: 41 U/L (ref 0–44)
AST: 15 U/L (ref 15–41)
Albumin: 5.5 g/dL — ABNORMAL HIGH (ref 3.5–5.0)
Alkaline Phosphatase: 59 U/L (ref 38–126)
Anion gap: 15 (ref 5–15)
BUN: 11 mg/dL (ref 6–20)
CO2: 28 mmol/L (ref 22–32)
Calcium: 10.8 mg/dL — ABNORMAL HIGH (ref 8.9–10.3)
Chloride: 99 mmol/L (ref 98–111)
Creatinine, Ser: 0.8 mg/dL (ref 0.61–1.24)
GFR, Estimated: >60 mL/min (ref >60)
Glucose, Bld: 122 mg/dL — ABNORMAL HIGH (ref 70–99)
Potassium: 3.7 mmol/L (ref 3.5–5.1)
Sodium: 142 mmol/L (ref 135–145)
Total Bilirubin: 2.4 mg/dL — ABNORMAL HIGH (ref 0.3–1.2)
Total Protein: 9.6 g/dL — ABNORMAL HIGH (ref 6.5–8.1)

## 2021-02-10 LAB — CBC WITH DIFFERENTIAL/PLATELET
Abs Immature Granulocytes: 0.09 10*3/uL — ABNORMAL HIGH (ref 0.00–0.07)
Basophils Absolute: 0 10*3/uL (ref 0.0–0.1)
Basophils Relative: 0 %
Eosinophils Absolute: 0 10*3/uL (ref 0.0–0.5)
Eosinophils Relative: 0 %
HCT: 38.8 % — ABNORMAL LOW (ref 39.0–52.0)
Hemoglobin: 14 g/dL (ref 13.0–17.0)
Immature Granulocytes: 1 %
Lymphocytes Relative: 13 %
Lymphs Abs: 1.7 10*3/uL (ref 0.7–4.0)
MCH: 29 pg (ref 26.0–34.0)
MCHC: 36.1 g/dL — ABNORMAL HIGH (ref 30.0–36.0)
MCV: 80.5 fL (ref 80.0–100.0)
Monocytes Absolute: 0.7 10*3/uL (ref 0.1–1.0)
Monocytes Relative: 5 %
Neutro Abs: 10.2 10*3/uL — ABNORMAL HIGH (ref 1.7–7.7)
Neutrophils Relative %: 81 %
Platelets: 171 10*3/uL (ref 150–400)
RBC: 4.82 MIL/uL (ref 4.22–5.81)
RDW: 15.5 % (ref 11.5–15.5)
WBC: 12.7 10*3/uL — ABNORMAL HIGH (ref 4.0–10.5)
nRBC: 0.2 % (ref 0.0–0.2)

## 2021-02-10 LAB — RETICULOCYTES
Immature Retic Fract: 30.1 % — ABNORMAL HIGH (ref 2.3–15.9)
RBC.: 4.85 MIL/uL (ref 4.22–5.81)
Retic Count, Absolute: 218.3 10*3/uL — ABNORMAL HIGH (ref 19.0–186.0)
Retic Ct Pct: 4.5 % — ABNORMAL HIGH (ref 0.4–3.1)

## 2021-02-10 LAB — RESP PANEL BY RT-PCR (FLU A&B, COVID) ARPGX2
Influenza A by PCR: NEGATIVE
Influenza B by PCR: NEGATIVE
SARS Coronavirus 2 by RT PCR: NEGATIVE

## 2021-02-10 MED ORDER — MORPHINE SULFATE 30 MG PO TABS
30.0000 mg | ORAL_TABLET | Freq: Once | ORAL | Status: AC
  Administered 2021-02-10: 30 mg via ORAL
  Filled 2021-02-10: qty 1

## 2021-02-10 MED ORDER — DICYCLOMINE HCL 20 MG PO TABS: 20.0000 mg | ORAL_TABLET | Freq: Two times a day (BID) | ORAL | 0 refills | Status: DC

## 2021-02-10 MED ORDER — ONDANSETRON HCL 4 MG/2ML IJ SOLN
4.0000 mg | Freq: Once | INTRAMUSCULAR | Status: AC
  Administered 2021-02-10: 4 mg via INTRAVENOUS
  Filled 2021-02-10: qty 2

## 2021-02-10 MED ORDER — KETOROLAC TROMETHAMINE 30 MG/ML IJ SOLN
15.0000 mg | Freq: Once | INTRAMUSCULAR | Status: AC
  Administered 2021-02-10: 15 mg via INTRAVENOUS
  Filled 2021-02-10: qty 1

## 2021-02-10 MED ORDER — DEXTROSE-NACL 5-0.45 % IV SOLN: INTRAVENOUS | Status: DC

## 2021-02-10 MED ORDER — HYDROMORPHONE HCL 2 MG/ML IJ SOLN
2.0000 mg | INTRAMUSCULAR | Status: AC
  Administered 2021-02-10: 2 mg via INTRAVENOUS
  Filled 2021-02-10: qty 1

## 2021-02-10 MED ORDER — ONDANSETRON 4 MG PO TBDP: 4.0000 mg | ORAL_TABLET | Freq: Three times a day (TID) | ORAL | 0 refills | Status: DC | PRN

## 2021-02-10 MED ORDER — ACETAMINOPHEN 500 MG PO TABS
1000.0000 mg | ORAL_TABLET | Freq: Once | ORAL | Status: AC
  Administered 2021-02-10: 1000 mg via ORAL
  Filled 2021-02-10: qty 2

## 2021-02-10 NOTE — ED Provider Notes (Signed)
[facility]-EMERGENCY DEPT Provider Note   CSN: [id number] Arrival date & time: 02/10/21  1404     History Chief Complaint  Patient presents with   Sickle Cell Pain Crisis   Emesis   Diarrhea    Austin [name] is a 30 y.o. male.  HPI Patient is a 30 year old male with past medical history significant for sickle cell disease, he has a history of nausea vomiting diarrhea and states that he was seen in the ER yesterday for similar symptoms.  He states he went home yesterday after the ER visit and felt well until he woke up this morning has had intractable nausea vomiting diarrhea since waking up.  He attempted take Phenergan which he is prescribed for nausea however he vomited back up.  Has not been able to tolerate p.o. today.  He denies any fevers but endorses chills.  He also states that he has had low back pain and bilateral hip pain which is consistent with his normal sickle cell crisis pain.  No fever, no urinary frequency urgency dysuria hematuria.  No chest pain or shortness of breath.  No lightheadedness or dizziness.  No blood in his diarrhea.  No recent antibiotics.  No cough.    Past Medical History:  Diagnosis Date   Acute maxillary sinusitis    Eczema    Sickle cell anemia (HCC) .    Patient Active Problem List   Diagnosis Date Noted   Vitamin D deficiency 11/16/2020   Bilateral lower extremity pain 10/17/2019   Sickle cell anemia without crisis (HCC) 09/20/2019   Chronic pain syndrome 09/20/2019   Nausea 09/20/2019   Chronic, continuous use of opioids 09/20/2019   Medication management 03/20/2019   Bilious vomiting without nausea    Viral gastroenteritis 01/23/2019   Nausea vomiting and diarrhea    Generalized abdominal pain    Hb-SS disease with crisis (HCC)    Dehydration    Sickle cell pain crisis (HCC) 05/18/2018   Acute sickle cell crisis (HCC) 12/15/2017   Thrombocytopenia (HCC) 12/15/2017   Splenomegaly 12/15/2017   Sickle  cell anemia with crisis (HCC) 12/15/2017   Spleen enlarged 08/18/2017    Past Surgical History:  Procedure Laterality Date   FOOT SURGERY Left 12/25/2020   WISDOM TOOTH EXTRACTION         Family History  Problem Relation Age of Onset   Sickle cell trait Mother    Diabetes Father    Hypertension Father     Social History   Tobacco Use   Smoking status: Former    Packs/day: 0.10    Types: Cigarettes   Smokeless tobacco: Never  Vaping Use   Vaping Use: Never used  Substance Use Topics   Alcohol use: Yes    Comment: occ   Drug use: Yes    Types: Marijuana    Home Medications Prior to Admission medications   Medication Sig Start Date End Date Taking? Authorizing Provider  dicyclomine (BENTYL) 20 MG tablet Take 1 tablet (20 mg total) by mouth 2 (two) times daily. 02/10/21  Yes Tyreanna Bisesi S, PA  ondansetron (ZOFRAN ODT) 4 MG disintegrating tablet Take 1 tablet (4 mg total) by mouth every 8 (eight) hours as needed for nausea or vomiting. 02/10/21  Yes Afrah Burlison S, PA  amoxicillin (AMOXIL) 500 MG tablet Take 1 tablet (500 mg total) by mouth 2 (two) times daily for 10 days. 02/09/21 02/19/21  Barbette Merino, NP  ergocalciferol (VITAMIN D2) 1.25 MG (50000  UT) capsule Take 1 capsule (50,000 Units total) by mouth once a week. X 12 weeks. Patient not taking: Reported on 02/09/2021 11/16/20 02/14/21  Barbette Merino, NP  ibuprofen (ADVIL) 800 MG tablet Take 1 tablet (800 mg total) by mouth every 8 (eight) hours as needed. Patient taking differently: Take 800 mg by mouth every 8 (eight) hours as needed for mild pain. 01/14/21   Barbette Merino, NP  lidocaine (LIDODERM) 5 % Place 1 patch onto the skin daily. Remove & Discard patch within 12 hours or as directed by MD 02/09/21   Gwenevere Abbot, MD  loratadine (CLARITIN) 10 MG tablet Take 1 tablet (10 mg total) by mouth daily. Patient taking differently: Take 10 mg by mouth daily as needed for allergies. 11/13/20   Barbette Merino, NP   Olopatadine HCl 0.2 % SOLN Apply 1 drop to eye in the morning and at bedtime. Patient taking differently: Apply 1 drop to eye 2 (two) times daily as needed (allergy symptoms). 11/13/20 11/13/21  Barbette Merino, NP  Oxycodone HCl 10 MG TABS Take 1 tablet (10 mg total) by mouth 4 (four) times daily as needed. 01/29/21   Barbette Merino, NP  promethazine (PHENERGAN) 25 MG tablet Take 1 tablet (25 mg total) by mouth every 6 (six) hours as needed for nausea or vomiting. 01/29/21   Barbette Merino, NP    Allergies    Patient has no known allergies.  Review of Systems   Review of Systems  Constitutional:  Positive for chills. Negative for fever.  HENT:  Negative for congestion.   Eyes:  Negative for pain.  Respiratory:  Negative for cough and shortness of breath.   Cardiovascular:  Negative for chest pain and leg swelling.  Gastrointestinal:  Positive for diarrhea, nausea and vomiting. Negative for abdominal pain.  Genitourinary:  Negative for dysuria.  Musculoskeletal:  Negative for myalgias.  Skin:  Negative for rash.  Neurological:  Negative for dizziness and headaches.   Physical Exam Updated Vital Signs BP 122/76   Pulse 90   Temp 98 F (36.7 C) (Oral)   Resp 18   Ht 5\' 7"  (1.702 m)   Wt 86.2 kg   SpO2 100%   BMI 29.76 kg/m   Physical Exam Vitals and nursing note reviewed.  Constitutional:      Appearance: He is ill-appearing. He is not toxic-appearing.     Comments: Uncomfortable appearing 30 year old male   HENT:     Head: Normocephalic and atraumatic.     Nose: Nose normal.     Mouth/Throat:     Mouth: Mucous membranes are dry.  Eyes:     General: No scleral icterus. Cardiovascular:     Rate and Rhythm: Normal rate and regular rhythm.     Pulses: Normal pulses.     Heart sounds: Normal heart sounds.  Pulmonary:     Effort: Pulmonary effort is normal. No respiratory distress.     Breath sounds: No wheezing.  Abdominal:     Palpations: Abdomen is soft.      Tenderness: There is no abdominal tenderness. There is no right CVA tenderness, left CVA tenderness, guarding or rebound.     Comments: Abdomen is soft, nontender.  No guarding or rebound.  No CVA tenderness.  Musculoskeletal:     Cervical back: Normal range of motion.     Right lower leg: No edema.     Left lower leg: No edema.  Skin:    General: Skin  is warm and dry.     Capillary Refill: Capillary refill takes less than 2 seconds.  Neurological:     Mental Status: He is alert. Mental status is at baseline.  Psychiatric:        Mood and Affect: Mood normal.        Behavior: Behavior normal.    ED Results / Procedures / Treatments   Labs (all labs ordered are listed, but only abnormal results are displayed) Labs Reviewed  CBC WITH DIFFERENTIAL/PLATELET - Abnormal; Notable for the following components:      Result Value   WBC 12.7 (*)    HCT 38.8 (*)    MCHC 36.1 (*)    Neutro Abs 10.2 (*)    Abs Immature Granulocytes 0.09 (*)    All other components within normal limits  COMPREHENSIVE METABOLIC PANEL - Abnormal; Notable for the following components:   Glucose, Bld 122 (*)    Calcium 10.8 (*)    Total Protein 9.6 (*)    Albumin 5.5 (*)    Total Bilirubin 2.4 (*)    All other components within normal limits  RETICULOCYTES - Abnormal; Notable for the following components:   Retic Ct Pct 4.5 (*)    Retic Count, Absolute 218.3 (*)    Immature Retic Fract 30.1 (*)    All other components within normal limits  RESP PANEL BY RT-PCR (FLU A&B, COVID) ARPGX2    EKG None  Radiology No results found.  Procedures Procedures   Medications Ordered in ED Medications  dextrose 5 %-0.45 % sodium chloride infusion ( Intravenous Stopped 02/10/21 1931)  ketorolac (TORADOL) 30 MG/ML injection 15 mg (15 mg Intravenous Given 02/10/21 1629)  HYDROmorphone (DILAUDID) injection 2 mg (2 mg Intravenous Given 02/10/21 1629)  HYDROmorphone (DILAUDID) injection 2 mg (2 mg Intravenous Given  02/10/21 1705)  morphine (MSIR) tablet 30 mg (30 mg Oral Given 02/10/21 1808)  ondansetron (ZOFRAN) injection 4 mg (4 mg Intravenous Given 02/10/21 1629)  acetaminophen (TYLENOL) tablet 1,000 mg (1,000 mg Oral Given 02/10/21 1629)    ED Course  I have reviewed the triage vital signs and the nursing notes.  Pertinent labs & imaging results that were available during my care of the patient were reviewed by me and considered in my medical decision making (see chart for details).    MDM Rules/Calculators/A&P                           Patient is a 30 year old male presented today for his second visit in the past 2 days he is presented today with continued nausea vomiting diarrhea.  He is overall well-appearing although he appears uncomfortable.  Given analgesia, IV hydration, Toradol, Zofran, Tylenol and Dilaudid.  Patient reassessed he feels much improved this time.  Is ambulatory states his pain is at a 0 and he is no longer nauseous.  Seems that he is been tolerating Zofran very well perhaps better than his Phenergan that he was prescribed for chronic nausea.  Will discharge home with Zofran, Bentyl.  Return precautions given.  COVID influenza test negative.  CBC with mild leukocytosis which appears to be an ongoing lab value I am not specifically concerned about a bacterial infection today.  No significant anemia.  Mild elevation in reticulocyte count indicative that patient has not an aplastic anemia.  CMP unremarkable.  Final Clinical Impression(s) / ED Diagnoses Final diagnoses:  Dehydration  Nausea vomiting and diarrhea    Rx /  DC Orders ED Discharge Orders          Ordered    ondansetron (ZOFRAN ODT) 4 MG disintegrating tablet  Every 8 hours PRN        02/10/21 1923    dicyclomine (BENTYL) 20 MG tablet  2 times daily        02/10/21 1923             Gailen ShelterFondaw, Ashlye Oviedo S, [place]PA 02/10/21 2358    Lorre NickAllen, Anthony, MD 02/11/21 1530

## 2021-02-10 NOTE — Discharge Instructions (Signed)
COVID and influenza test were negative today.  Your work-up was ultimately reassuring.  I am that you feel improved after the medication given here.  I given you 1 additional dose of Zofran before discharge 2.  I prescribed you Zofran for nausea and vomiting.  Bentyl for pain. Please follow-up with a primary care provider within the next 48 hours.  Drink plenty of water.  Please use Tylenol or ibuprofen for pain.  You may use 600 mg ibuprofen every 6 hours or 1000 mg of Tylenol every 6 hours.  You may choose to alternate between the 2.  This would be most effective.  Not to exceed 4 g of Tylenol within 24 hours.  Not to exceed 3200 mg ibuprofen 24 hours.

## 2021-02-10 NOTE — ED Triage Notes (Signed)
Patient c/o sickle cell pain in the lower back since yesterday, Patient also c/o chills,vomiting and diarrhea since yesterday.

## 2021-02-12 ENCOUNTER — Encounter: Payer: Self-pay | Admitting: Physical Therapy

## 2021-02-12 ENCOUNTER — Ambulatory Visit: Payer: 59 | Admitting: Physical Therapy

## 2021-02-12 ENCOUNTER — Other Ambulatory Visit: Payer: Self-pay

## 2021-02-12 DIAGNOSIS — M25572 Pain in left ankle and joints of left foot: Secondary | ICD-10-CM

## 2021-02-12 DIAGNOSIS — M25672 Stiffness of left ankle, not elsewhere classified: Secondary | ICD-10-CM | POA: Diagnosis not present

## 2021-02-12 DIAGNOSIS — M6281 Muscle weakness (generalized): Secondary | ICD-10-CM | POA: Diagnosis not present

## 2021-02-12 DIAGNOSIS — R2689 Other abnormalities of gait and mobility: Secondary | ICD-10-CM

## 2021-02-12 NOTE — Therapy (Signed)
[facility] [address] [place], [place], [address] Phone: [phone]   Fax:  [phone]  Physical Therapy Treatment  Patient Details  Name: Austin [name] MRN: [id number] Date of Birth: April 14, 1991 Referring Provider (PT): Thad Ranger, NP   Encounter Date: 02/12/2021   PT End of Session - 02/12/21 1535     Visit Number 4    Number of Visits 13    Date for PT Re-Evaluation 03/11/21    Authorization Type [facility]    PT Start Time 1533    PT Stop Time 1618    PT Time Calculation (min) 45 min    Activity Tolerance Patient tolerated treatment well;No increased pain    Behavior During Therapy WFL for tasks assessed/performed             Past Medical History:  Diagnosis Date   Acute maxillary sinusitis    Eczema    Sickle cell anemia (HCC) .    Past Surgical History:  Procedure Laterality Date   FOOT SURGERY Left 12/25/2020   WISDOM TOOTH EXTRACTION      There were no vitals filed for this visit.   Subjective Assessment - 02/12/21 1536     Subjective Patient reports good compliance with HEP, no longer using the CAM boot. Patient reports 2 sickle cell crises, went to ED 2 days in a row.    Pertinent History Sickle cell anemia.    Limitations Standing;Walking;House hold activities    Pain Score 3     Pain Location Foot    Pain Type Acute pain;Surgical pain    Pain Onset 1 to 4 weeks ago                [facility] PT Assessment - 02/12/21 0001       Assessment   Medical Diagnosis S/P plantar fasciotomy    Referring Provider (PT) Thad Ranger, NP    Onset Date/Surgical Date 12/25/20    Next MD Visit TBD - 2 months      Restrictions   Weight Bearing Restrictions Yes    LLE Weight Bearing Weight bearing as tolerated      AROM   Left Ankle Dorsiflexion 6    Left Ankle Plantar Flexion 44                           [facility] Adult PT Treatment/Exercise - 02/12/21 0001       Knee/Hip  Exercises: Standing   Side Lunges 10 reps    Side Lunges Limitations BOSU    Other Standing Knee Exercises Floor ladder: in and outs, diagonals, flat foot, not ready for on toes      Manual Therapy   Manual Therapy Soft tissue mobilization;Joint mobilization;Passive ROM    Joint Mobilization Grade III/IV ant drawer, transverse rotation, AP/PA mets    Soft tissue mobilization STM plantar surface and lateral ankle    Passive ROM DF and toe DF/PF      Ankle Exercises: Aerobic   Recumbent Bike BUSY      Ankle Exercises: Stretches   Slant Board Stretch 30 seconds;2 reps      Ankle Exercises: Standing   Rocker Board 1 minute    Rocker Board Limitations FWD/BK, R/L    Heel Raises --      Ankle Exercises: Machines for Strengthening   Cybex Leg Press 65# Bx15; 35# Lx22, x10; Heel raises 65# B x20  PT Education - 02/12/21 1626     Education Details Replace seated stretches with standing stretches, d/c towel exercises, focus on dynamic SLS activity.    Person(s) Educated Patient    Methods Explanation;Demonstration;Verbal cues    Comprehension Returned demonstration              PT Short Term Goals - 02/12/21 1631       PT SHORT TERM GOAL #2   Title Patient will fully weight bear on L LE without CAM boot to begin gait training with no more than 5/10 pain.    Baseline WB only in CAM boot for <10 feet with pain >8/10.    Status Achieved      PT SHORT TERM GOAL #3   Title Patient will improve L DF to neutral to allow gait training to progress toward nomalized pattern.    Baseline L DF=-15deg    Status Achieved               PT Long Term Goals - 01/28/21 1431       PT LONG TERM GOAL #1   Title Patient will be independent with final HEP and progression to continue to reduce pain and symptoms after discharge.    Baseline No exercise program    Time 6    Period Weeks    Status New    Target Date 03/11/21      PT LONG TERM GOAL #2    Title FOTO score will increase to 62% as predicted for improved perception of functional abilities.    Baseline 29%    Time 6    Period Weeks    Status New    Target Date 03/11/21      PT LONG TERM GOAL #3   Title Patient will report walking >20 min with no more than 2/10 pain without assistive device to demonstrate short community mobility.    Baseline Using knee scooter for all mobility out of the house and most in the house.    Time 6    Period Weeks    Status New    Target Date 03/11/21      PT LONG TERM GOAL #4   Title Patient will improve L DF to >10deg to allow up/down steps to enter with reciprocal pattern per home environment.    Baseline Step to pattern with CAM boot and incrased pain/difficulty navigating up/down steps to enter at home.    Time 6    Period Weeks    Status New    Target Date 03/11/21                   Plan - 02/12/21 1619     Clinical Impression Statement Patient now ambulating full time without CAM boot, using sneakers. Patient reports 2 episodes of sickle cell crisis this week. Patient has progressed with ankle AROM DF=6, PF=44.  Patient without pain at start of treatment, increased pain with foot intrinsics after treatment.  Patient continues to be challenged by stability in dynamic single leg stance. Patient will benefit from continued skilled PT to address deficits and maximize return to full and active independent community access.    Comorbidities Sickle Cell disorder    Examination-Activity Limitations Bathing;Transfers;Squat;Caring for Others;Carry;Stand;Stairs;Locomotion Level    PT Treatment/Interventions ADLs/Self Care Home Management;Cryotherapy;Moist Heat;DME Instruction;Gait training;Stair training;Functional mobility training;Therapeutic activities;Therapeutic exercise;Balance training;Neuromuscular re-education;Patient/family education;Manual techniques;Scar mobilization;Passive range of motion;Taping;Vasopneumatic Device    PT Next  Visit Plan Progress weight bearing/gait/AD, dynamic balance, assess  HEP and PROGRESS next visit, add SLS with forward reach. Floor ladder, TRX, check stairs, ankle ROM.    PT Home Exercise Plan Access Code: [email or web address]    Consulted and Agree with Plan of Care Patient             Patient will benefit from skilled therapeutic intervention in order to improve the following deficits and impairments:  Abnormal gait, Decreased balance, Decreased endurance, Decreased mobility, Difficulty walking, Impaired sensation, Increased edema, Decreased activity tolerance, Decreased strength, Hypermobility, Impaired flexibility, Pain  Visit Diagnosis: Pain in left ankle and joints of left foot  Stiffness of left ankle, not elsewhere classified  Muscle weakness (generalized)  Other abnormalities of gait and mobility     Problem List Patient Active Problem List   Diagnosis Date Noted   Vitamin D deficiency 11/16/2020   Bilateral lower extremity pain 10/17/2019   Sickle cell anemia without crisis (HCC) 09/20/2019   Chronic pain syndrome 09/20/2019   Nausea 09/20/2019   Chronic, continuous use of opioids 09/20/2019   Medication management 03/20/2019   Bilious vomiting without nausea    Viral gastroenteritis 01/23/2019   Nausea vomiting and diarrhea    Generalized abdominal pain    Hb-SS disease with crisis (HCC)    Dehydration    Sickle cell pain crisis (HCC) 05/18/2018   Acute sickle cell crisis (HCC) 12/15/2017   Thrombocytopenia (HCC) 12/15/2017   Splenomegaly 12/15/2017   Sickle cell anemia with crisis (HCC) 12/15/2017   Spleen enlarged 08/18/2017    Myrla Halsted, PT 02/12/2021, 4:34 PM  [facility] [address] [place], [place], [address] Phone: [phone]   Fax:  [phone]  Name: ROSHAN ROBACK MRN: [id number] Date of Birth: 13-Apr-1991

## 2021-02-12 NOTE — Patient Instructions (Signed)
Access Code: [email or web address] URL: [email or web address] Date: 02/12/2021 Prepared by: Myrla Halsted  NEW Exercises  Standing Gastroc Stretch on Foam 1/2 Roll - 1 x daily - 7 x weekly - 2 sets - 30sec hold Standing Soleus Stretch on Foam 1/2 Roll - 1 x daily - 7 x weekly - 2 sets - 30sec hold

## 2021-02-13 ENCOUNTER — Other Ambulatory Visit: Payer: Self-pay | Admitting: Family Medicine

## 2021-02-13 ENCOUNTER — Other Ambulatory Visit: Payer: Self-pay

## 2021-02-13 DIAGNOSIS — F1190 Opioid use, unspecified, uncomplicated: Secondary | ICD-10-CM

## 2021-02-13 DIAGNOSIS — G894 Chronic pain syndrome: Secondary | ICD-10-CM

## 2021-02-13 MED ORDER — OXYCODONE HCL 10 MG PO TABS: 10.0000 mg | ORAL_TABLET | Freq: Four times a day (QID) | ORAL | 0 refills | Status: DC | PRN

## 2021-02-13 NOTE — Progress Notes (Signed)
Reviewed PDMP substance reporting system prior to prescribing opiate medications. No inconsistencies noted.    Meds ordered this encounter  Medications   Oxycodone HCl 10 MG TABS    Sig: Take 1 tablet (10 mg total) by mouth 4 (four) times daily as needed.    Dispense:  60 tablet    Refill:  0    Order Specific Question:   Supervising Provider    Answer:   Quentin Angst [[id number]]     Nolon Nations  APRN, MSN, FNP-C Patient Care [facility] [address] [place], [place] [address] [phone]

## 2021-02-16 ENCOUNTER — Encounter: Payer: Self-pay | Admitting: Physical Therapy

## 2021-02-16 ENCOUNTER — Ambulatory Visit: Payer: 59 | Admitting: Physical Therapy

## 2021-02-16 ENCOUNTER — Other Ambulatory Visit: Payer: Self-pay

## 2021-02-16 DIAGNOSIS — M25672 Stiffness of left ankle, not elsewhere classified: Secondary | ICD-10-CM

## 2021-02-16 DIAGNOSIS — R2689 Other abnormalities of gait and mobility: Secondary | ICD-10-CM | POA: Diagnosis not present

## 2021-02-16 DIAGNOSIS — M6281 Muscle weakness (generalized): Secondary | ICD-10-CM | POA: Diagnosis not present

## 2021-02-16 DIAGNOSIS — M25572 Pain in left ankle and joints of left foot: Secondary | ICD-10-CM

## 2021-02-16 NOTE — Therapy (Signed)
[place], [place], [address] Phone: [phone]   Fax:  [phone]  Physical Therapy Treatment  Patient Details  Name: Austin [name] MRN: [id number] Date of Birth: Sep 07, 1990 Referring Provider (PT): Dionisio David, NP   Encounter Date: 02/16/2021   PT End of Session - 02/16/21 1540     Visit Number 5    Number of Visits 13    Date for PT Re-Evaluation 03/11/21    Authorization Type [facility]    PT Start Time 1532    PT Stop Time 7867    PT Time Calculation (min) 43 min    Activity Tolerance Patient limited by pain    Behavior During Therapy [facility] for tasks assessed/performed             Past Medical History:  Diagnosis Date   Acute maxillary sinusitis    Eczema    Sickle cell anemia ([place]) .    Past Surgical History:  Procedure Laterality Date   FOOT SURGERY Left 12/25/2020   WISDOM TOOTH EXTRACTION      There were no vitals filed for this visit.   Subjective Assessment - 02/16/21 1535     Subjective Patient reports he has been very sore the past few days, and he also felt a pop on the top of his foot yesterday while grilling. States the pop was painful, and he still feels it.    Patient Stated Goals Get back to walking without pain.    Currently in Pain? Yes    Pain Score 7     Pain Location Foot    Pain Orientation Left    Pain Descriptors / Indicators Constant    Pain Type Chronic pain;Surgical pain    Pain Onset More than a month ago    Pain Frequency Constant                [facility] PT Assessment - 02/16/21 0001       Strength   Left Ankle Dorsiflexion 4/5    Left Ankle Inversion 4/5    Left Ankle Eversion 3-/5      Palpation   Palpation comment TTP mainly  base and mid-shaft 5th met, also with TTP dorsal aspect of mid foot and plantar surface                           [facility] Adult PT Treatment/Exercise - 02/16/21 0001       Exercises    Exercises Ankle      Manual Therapy   Manual Therapy Soft tissue mobilization;Myofascial release    Soft tissue mobilization Lef gastroc soleus complex    Myofascial Release Left lateral gastroc      Ankle Exercises: Stretches   Gastroc Stretch 2 reps;30 seconds    Gastroc Stretch Limitations supine with strap      Ankle Exercises: Seated   Towel Crunch Limitations 2 x 10    Towel Inversion/Eversion Limitations 2 x 10    Heel Raises 10 reps   2 sets   Toe Raise 10 reps   2 sets     Ankle Exercises: Supine   T-Band 4-way banded ankle with red 2 x 10 except for eversion, manual resistance given to eversion 2 x 10                    PT Education - 02/16/21 1539     Education Details  HEP, pain and swelling management    Person(s) Educated Patient    Methods Explanation;Demonstration;Verbal cues    Comprehension Verbalized understanding;Returned demonstration;Verbal cues required;Need further instruction              PT Short Term Goals - 02/12/21 1631       PT SHORT TERM GOAL #2   Title Patient will fully weight bear on L LE without CAM boot to begin gait training with no more than 5/10 pain.    Baseline WB only in CAM boot for <10 feet with pain >8/10.    Status Achieved      PT SHORT TERM GOAL #3   Title Patient will improve L DF to neutral to allow gait training to progress toward nomalized pattern.    Baseline L DF=-15deg    Status Achieved               PT Long Term Goals - 01/28/21 1431       PT LONG TERM GOAL #1   Title Patient will be independent with final HEP and progression to continue to reduce pain and symptoms after discharge.    Baseline No exercise program    Time 6    Period Weeks    Status New    Target Date 03/11/21      PT LONG TERM GOAL #2   Title FOTO score will increase to 62% as predicted for improved perception of functional abilities.    Baseline 29%    Time 6    Period Weeks    Status New    Target Date 03/11/21       PT LONG TERM GOAL #3   Title Patient will report walking >20 min with no more than 2/10 pain without assistive device to demonstrate short community mobility.    Baseline Using knee scooter for all mobility out of the house and most in the house.    Time 6    Period Weeks    Status New    Target Date 03/11/21      PT LONG TERM GOAL #4   Title Patient will improve L DF to >10deg to allow up/down steps to enter with reciprocal pattern per home environment.    Baseline Step to pattern with CAM boot and incrased pain/difficulty navigating up/down steps to enter at home.    Time 6    Period Weeks    Status New    Target Date 03/11/21                   Plan - 02/16/21 1622     Clinical Impression Statement Patient limited this visit due to left foot pain from feeling pop in the mid foot region yesterday. He did exhibit tenderness to the mid-lateral midfoot area and over 5th met, he also exhibited eversion weakness this visit. Therapy was regressed due to pain and performed light AROM and strengthening exercises. Patient was also reporting increased calf tightness so STM performed to left gastroc with palpable trigger points. Patient encouraged to use ice, elevation, compression, and reduce activity level over the next day or two to reduce pain. He would benefit from continued skilled PT to progress his ankle mobility and strength in order to reduce pain and maximize functional ability.    PT Treatment/Interventions ADLs/Self Care Home Management;Cryotherapy;Moist Heat;DME Instruction;Gait training;Stair training;Functional mobility training;Therapeutic activities;Therapeutic exercise;Balance training;Neuromuscular re-education;Patient/family education;Manual techniques;Scar mobilization;Passive range of motion;Taping;Vasopneumatic Device    PT Next Visit Plan Progress weight bearing/gait/AD,  dynamic balance, assess HEP and PROGRESS next visit, add SLS with forward reach. Floor ladder,  TRX, check stairs, ankle ROM.    PT Home Exercise Plan Access Code: [email or web address]    Consulted and Agree with Plan of Care Patient             Patient will benefit from skilled therapeutic intervention in order to improve the following deficits and impairments:  Abnormal gait, Decreased balance, Decreased endurance, Decreased mobility, Difficulty walking, Impaired sensation, Increased edema, Decreased activity tolerance, Decreased strength, Hypermobility, Impaired flexibility, Pain  Visit Diagnosis: Pain in left ankle and joints of left foot  Stiffness of left ankle, not elsewhere classified  Muscle weakness (generalized)  Other abnormalities of gait and mobility     Problem List Patient Active Problem List   Diagnosis Date Noted   Vitamin D deficiency 11/16/2020   Bilateral lower extremity pain 10/17/2019   Sickle cell anemia without crisis ([place]) 09/20/2019   Chronic pain syndrome 09/20/2019   Nausea 09/20/2019   Chronic, continuous use of opioids 09/20/2019   Medication management 03/20/2019   Bilious vomiting without nausea    Viral gastroenteritis 01/23/2019   Nausea vomiting and diarrhea    Generalized abdominal pain    Hb-SS disease with crisis ([place])    Dehydration    Sickle cell pain crisis ([place]) 05/18/2018   Acute sickle cell crisis ([place]) 12/15/2017   Thrombocytopenia ([place]) 12/15/2017   Splenomegaly 12/15/2017   Sickle cell anemia with crisis ([place]) 12/15/2017   Spleen enlarged 08/18/2017    Hilda Blades, PT, DPT, LAT, ATC 02/16/21  4:30 PM Phone: [phone] Fax: [facility] [address] [place], [place], [address] Phone: [phone]   Fax:  [phone]  Name: SHEROD CISSE MRN: [id number] Date of Birth: 1991-01-18

## 2021-02-19 ENCOUNTER — Other Ambulatory Visit: Payer: Self-pay

## 2021-02-19 ENCOUNTER — Ambulatory Visit: Payer: 59 | Admitting: Physical Therapy

## 2021-02-19 ENCOUNTER — Encounter: Payer: Self-pay | Admitting: Physical Therapy

## 2021-02-19 DIAGNOSIS — M6281 Muscle weakness (generalized): Secondary | ICD-10-CM

## 2021-02-19 DIAGNOSIS — M25572 Pain in left ankle and joints of left foot: Secondary | ICD-10-CM

## 2021-02-19 DIAGNOSIS — R2689 Other abnormalities of gait and mobility: Secondary | ICD-10-CM | POA: Diagnosis not present

## 2021-02-19 DIAGNOSIS — M25672 Stiffness of left ankle, not elsewhere classified: Secondary | ICD-10-CM

## 2021-02-19 NOTE — Therapy (Signed)
[place], [place], [address] Phone: [phone]   Fax:  [phone]  Physical Therapy Treatment  Patient Details  Name: Austin [name] MRN: [id number] Date of Birth: 09-22-1990 Referring Provider (PT): Dionisio David, NP   Encounter Date: 02/19/2021   PT End of Session - 02/19/21 1537     Visit Number 6    Number of Visits 13    Date for PT Re-Evaluation 03/11/21    Authorization Type [facility]    PT Start Time 1497    PT Stop Time 0263    PT Time Calculation (min) 38 min    Activity Tolerance Patient tolerated treatment well;No increased pain    Behavior During Therapy [facility] for tasks assessed/performed             Past Medical History:  Diagnosis Date   Acute maxillary sinusitis    Eczema    Sickle cell anemia ([place]) .    Past Surgical History:  Procedure Laterality Date   FOOT SURGERY Left 12/25/2020   WISDOM TOOTH EXTRACTION      There were no vitals filed for this visit.   Subjective Assessment - 02/19/21 1539     Subjective Patient reports he still has pain and discoloration on L foot.    Pertinent History Sickle cell anemia.    Limitations Standing;Walking;House hold activities    How long can you stand comfortably? Almost always in the boot.    Currently in Pain? Yes    Pain Score 0-No pain   Max=7/10  when up on it for too long.   Pain Location Foot    Pain Orientation Left    Pain Descriptors / Indicators Constant    Pain Type Surgical pain                               [facility] Adult PT Treatment/Exercise - 02/19/21 0001       Knee/Hip Exercises: Standing   Rocker Board 1 minute   2 sets DF/PF and R/L   SLS 2x30sec on THERAPAD, no UE support    Other Standing Knee Exercises [place] leg squat/kick/reach (see HEP) x10   initially small range/small squat     Knee/Hip Exercises: Sidelying   Other Sidelying Knee/Hip Exercises straight leg toe taps  front/behind R/L x10ea for glute med strength      Manual Therapy   Manual Therapy Soft tissue mobilization;Myofascial release    Soft tissue mobilization Lef gastroc soleus complex    Myofascial Release Left lateral gastroc    Passive ROM DF and toe DF/PF      Ankle Exercises: Standing   Heel Raises 10 reps   Single leg with R foot resting on chair                   PT Education - 02/19/21 1803     Education Details Additions to HEP.    Person(s) Educated Patient    Methods Explanation;Demonstration;Verbal cues    Comprehension Returned demonstration              PT Short Term Goals - 02/12/21 1631       PT SHORT TERM GOAL #2   Title Patient will fully weight bear on L LE without CAM boot to begin gait training with no more than 5/10 pain.    Baseline WB only in CAM boot for <10 feet with pain >  8/10.    Status Achieved      PT SHORT TERM GOAL #3   Title Patient will improve L DF to neutral to allow gait training to progress toward nomalized pattern.    Baseline L DF=-15deg    Status Achieved               PT Long Term Goals - 01/28/21 1431       PT LONG TERM GOAL #1   Title Patient will be independent with final HEP and progression to continue to reduce pain and symptoms after discharge.    Baseline No exercise program    Time 6    Period Weeks    Status New    Target Date 03/11/21      PT LONG TERM GOAL #2   Title FOTO score will increase to 62% as predicted for improved perception of functional abilities.    Baseline 29%    Time 6    Period Weeks    Status New    Target Date 03/11/21      PT LONG TERM GOAL #3   Title Patient will report walking >20 min with no more than 2/10 pain without assistive device to demonstrate short community mobility.    Baseline Using knee scooter for all mobility out of the house and most in the house.    Time 6    Period Weeks    Status New    Target Date 03/11/21      PT LONG TERM GOAL #4   Title  Patient will improve L DF to >10deg to allow up/down steps to enter with reciprocal pattern per home environment.    Baseline Step to pattern with CAM boot and incrased pain/difficulty navigating up/down steps to enter at home.    Time 6    Period Weeks    Status New    Target Date 03/11/21                   Plan - 02/19/21 1556     Clinical Impression Statement Patient responded well to manual treatment with improved flexibility.  Patient continues with some pain and lateral discoloration but seems to be improved since prior visit, more tenderness at first met head as at initial evaluation. Patient has tendency toward hip ER in static stance, demonstrated weak glute med. Will send message to surgeon about sudden onset of pain and lateral foot discoloration. He would benefit from continued skilled PT to progress his ankle mobility and strength in order to reduce pain and maximize functional ability.    Comorbidities Sickle Cell disorder    Examination-Activity Limitations Bathing;Transfers;Squat;Caring for Others;Carry;Stand;Stairs;Locomotion Level    Examination-Participation Restrictions Cleaning;Laundry;Yard Work;Community Activity;Occupation;Shop    PT Treatment/Interventions ADLs/Self Care Home Management;Cryotherapy;Moist Heat;DME Instruction;Gait training;Stair training;Functional mobility training;Therapeutic activities;Therapeutic exercise;Balance training;Neuromuscular re-education;Patient/family education;Manual techniques;Scar mobilization;Passive range of motion;Taping;Vasopneumatic Device    PT Next Visit Plan NEEDS FOTO next visit. Progress weight bearing/gait/AD, dynamic balance, assess HEP and PROGRESS; Floor ladder, TRX, check stairs, ankle ROM.    PT Home Exercise Plan Access Code: [email or web address]    Consulted and Agree with Plan of Care Patient             Patient will benefit from skilled therapeutic intervention in order to improve the following deficits and  impairments:  Abnormal gait, Decreased balance, Decreased endurance, Decreased mobility, Difficulty walking, Impaired sensation, Increased edema, Decreased activity tolerance, Decreased strength, Hypermobility, Impaired flexibility, Pain  Visit Diagnosis: Pain in left ankle  and joints of left foot  Stiffness of left ankle, not elsewhere classified  Muscle weakness (generalized)  Other abnormalities of gait and mobility     Problem List Patient Active Problem List   Diagnosis Date Noted   Vitamin D deficiency 11/16/2020   Bilateral lower extremity pain 10/17/2019   Sickle cell anemia without crisis ([place]) 09/20/2019   Chronic pain syndrome 09/20/2019   Nausea 09/20/2019   Chronic, continuous use of opioids 09/20/2019   Medication management 03/20/2019   Bilious vomiting without nausea    Viral gastroenteritis 01/23/2019   Nausea vomiting and diarrhea    Generalized abdominal pain    Hb-SS disease with crisis ([place])    Dehydration    Sickle cell pain crisis ([place]) 05/18/2018   Acute sickle cell crisis ([place]) 12/15/2017   Thrombocytopenia ([place]) 12/15/2017   Splenomegaly 12/15/2017   Sickle cell anemia with crisis ([place]) 12/15/2017   Spleen enlarged 08/18/2017    Pollyann Samples 02/19/2021, 6:19 PM  [facility] [address] [place], [place], [address] Phone: [phone]   Fax:  [phone]  Name: Austin [name] MRN: [id number] Date of Birth: 07-28-1990

## 2021-02-19 NOTE — Patient Instructions (Signed)
Access Code: [email or web address] URL: [email or web address] Date: 02/19/2021 Prepared by: Myrla Halsted  New Exercises  Single Leg Squat with Forward Reach - 1 x daily - 7 x weekly - 1 sets - 3sec hold - 10 reps Sidelying toe taps for glute med strength - 1 x daily - 7 x weekly - 2 sets - 10 reps

## 2021-02-23 ENCOUNTER — Encounter: Payer: Self-pay | Admitting: Physical Therapy

## 2021-02-23 ENCOUNTER — Other Ambulatory Visit: Payer: Self-pay

## 2021-02-23 ENCOUNTER — Ambulatory Visit: Payer: 59 | Attending: Nurse Practitioner | Admitting: Physical Therapy

## 2021-02-23 DIAGNOSIS — M25672 Stiffness of left ankle, not elsewhere classified: Secondary | ICD-10-CM | POA: Insufficient documentation

## 2021-02-23 DIAGNOSIS — M6281 Muscle weakness (generalized): Secondary | ICD-10-CM | POA: Diagnosis not present

## 2021-02-23 DIAGNOSIS — R2689 Other abnormalities of gait and mobility: Secondary | ICD-10-CM | POA: Diagnosis not present

## 2021-02-23 DIAGNOSIS — M25572 Pain in left ankle and joints of left foot: Secondary | ICD-10-CM | POA: Insufficient documentation

## 2021-02-23 NOTE — Therapy (Signed)
[facility] [address] [place], [place], [address] Phone: [phone]   Fax:  [phone]  Physical Therapy Treatment  Patient Details  Name: Austin [name] MRN: [id number] Date of Birth: 1991/07/23 Referring Provider (PT): Thad Ranger, NP   Encounter Date: 02/23/2021   PT End of Session - 02/23/21 1300     Visit Number 7    Number of Visits 13    Date for PT Re-Evaluation 03/11/21    Authorization Type [facility]    PT Start Time 1220    PT Stop Time 1300    PT Time Calculation (min) 40 min    Activity Tolerance Patient tolerated treatment well    Behavior During Therapy [facility] for tasks assessed/performed             Past Medical History:  Diagnosis Date   Acute maxillary sinusitis    Eczema    Sickle cell anemia ([facility]) .    Past Surgical History:  Procedure Laterality Date   FOOT SURGERY Left 12/25/2020   WISDOM TOOTH EXTRACTION      There were no vitals filed for this visit.   Subjective Assessment - 02/23/21 1225     Subjective Patient reports he is still having pain on the outside of the foot and midfoot when he is on his feet, hasn't really improved. He does report he has been in bed most of the day today from having a headache so isn't having too much pain now.    Patient Stated Goals Get back to walking without pain.    Currently in Pain? Yes    Pain Score 5     Pain Location Foot    Pain Orientation Left    Pain Descriptors / Indicators Aching;Throbbing    Pain Type Surgical pain;Chronic pain    Pain Onset More than a month ago    Pain Frequency Constant    Aggravating Factors  Walking, standing                               [facility] Adult PT Treatment/Exercise - 02/23/21 0001       Exercises   Exercises Ankle      Manual Therapy   Manual Therapy Soft tissue mobilization;Myofascial release    Soft tissue mobilization IASTM left gastroc soleus complex and plantar fascia     Myofascial Release Left lateral gastroc      Ankle Exercises: Stretches   Slant Board Stretch 3 reps;30 seconds      Ankle Exercises: Standing   SLS 2 x 30 sec on left only    Heel Raises 15 reps   2 sets   Heel Raises Limitations on Airex pad for cushioning    Other Standing Ankle Exercises Standing SL squat/kick/reach 2 x 10      Ankle Exercises: Supine   T-Band 4-way banded ankle with red 2 x 15    Other Supine Ankle Exercises Hip abduction arcs 2 x 10    Other Supine Ankle Exercises Eversion AROM 2 x 10                    PT Education - 02/23/21 1228     Education Details HEP    Person(s) Educated Patient    Methods Explanation;Demonstration;Verbal cues    Comprehension Verbalized understanding;Returned demonstration;Verbal cues required;Need further instruction  PT Short Term Goals - 02/12/21 1631       PT SHORT TERM GOAL #2   Title Patient will fully weight bear on L LE without CAM boot to begin gait training with no more than 5/10 pain.    Baseline WB only in CAM boot for <10 feet with pain >8/10.    Status Achieved      PT SHORT TERM GOAL #3   Title Patient will improve L DF to neutral to allow gait training to progress toward nomalized pattern.    Baseline L DF=-15deg    Status Achieved               PT Long Term Goals - 01/28/21 1431       PT LONG TERM GOAL #1   Title Patient will be independent with final HEP and progression to continue to reduce pain and symptoms after discharge.    Baseline No exercise program    Time 6    Period Weeks    Status New    Target Date 03/11/21      PT LONG TERM GOAL #2   Title FOTO score will increase to 62% as predicted for improved perception of functional abilities.    Baseline 29%    Time 6    Period Weeks    Status New    Target Date 03/11/21      PT LONG TERM GOAL #3   Title Patient will report walking >20 min with no more than 2/10 pain without assistive device to  demonstrate short community mobility.    Baseline Using knee scooter for all mobility out of the house and most in the house.    Time 6    Period Weeks    Status New    Target Date 03/11/21      PT LONG TERM GOAL #4   Title Patient will improve L DF to >10deg to allow up/down steps to enter with reciprocal pattern per home environment.    Baseline Step to pattern with CAM boot and incrased pain/difficulty navigating up/down steps to enter at home.    Time 6    Period Weeks    Status New    Target Date 03/11/21                   Plan - 02/23/21 1308     Clinical Impression Statement Patient tolerated therapy well with no adverse effects. He does continue to report persistent lateral foot and midfoot pain for past week, so he was instructed to contact his doctor to see if they would like to see him any sooner. Patient did respond well to manual therapy for reduced calf tightness and was able to tolerate standing strenthening and balance well. He seemed to perform eversion strengthening much better this visit. No changes to HEP and her would benefit from continued skilled PT to progress his ankle mobility and strength in order to reduce pain and maximize functional ability.    PT Treatment/Interventions ADLs/Self Care Home Management;Cryotherapy;Moist Heat;DME Instruction;Gait training;Stair training;Functional mobility training;Therapeutic activities;Therapeutic exercise;Balance training;Neuromuscular re-education;Patient/family education;Manual techniques;Scar mobilization;Passive range of motion;Taping;Vasopneumatic Device    PT Next Visit Plan Assess FOTO next visit. Progress ankle mobility and strength, balance training    PT Home Exercise Plan Access Code: [email or web address]    Consulted and Agree with Plan of Care Patient             Patient will benefit from skilled therapeutic intervention in order to improve  the following deficits and impairments:  Abnormal gait, Decreased  balance, Decreased endurance, Decreased mobility, Difficulty walking, Impaired sensation, Increased edema, Decreased activity tolerance, Decreased strength, Hypermobility, Impaired flexibility, Pain  Visit Diagnosis: Pain in left ankle and joints of left foot  Stiffness of left ankle, not elsewhere classified  Muscle weakness (generalized)  Other abnormalities of gait and mobility     Problem List Patient Active Problem List   Diagnosis Date Noted   Vitamin D deficiency 11/16/2020   Bilateral lower extremity pain 10/17/2019   Sickle cell anemia without crisis ([facility]) 09/20/2019   Chronic pain syndrome 09/20/2019   Nausea 09/20/2019   Chronic, continuous use of opioids 09/20/2019   Medication management 03/20/2019   Bilious vomiting without nausea    Viral gastroenteritis 01/23/2019   Nausea vomiting and diarrhea    Generalized abdominal pain    Hb-SS disease with crisis ([facility])    Dehydration    Sickle cell pain crisis ([facility]) 05/18/2018   Acute sickle cell crisis ([facility]) 12/15/2017   Thrombocytopenia ([facility]) 12/15/2017   Splenomegaly 12/15/2017   Sickle cell anemia with crisis ([facility]) 12/15/2017   Spleen enlarged 08/18/2017    Rosana Hoes, PT, DPT, LAT, ATC 02/23/21  1:15 PM Phone: [phone] Fax: [phone]   [facility] [address] [place], [place], [address] Phone: [phone]   Fax:  [phone]  Name: GRANTHAM HIPPERT MRN: [id number] Date of Birth: 22-Jan-1991

## 2021-02-26 ENCOUNTER — Other Ambulatory Visit: Payer: Self-pay | Admitting: Nurse Practitioner

## 2021-02-26 ENCOUNTER — Ambulatory Visit: Payer: 59 | Admitting: Physical Therapy

## 2021-02-26 MED ORDER — ONDANSETRON 4 MG PO TBDP: 4.0000 mg | ORAL_TABLET | Freq: Three times a day (TID) | ORAL | 0 refills | Status: DC | PRN

## 2021-02-26 MED ORDER — DICYCLOMINE HCL 20 MG PO TABS: 20.0000 mg | ORAL_TABLET | Freq: Two times a day (BID) | ORAL | 0 refills | Status: DC

## 2021-02-28 ENCOUNTER — Other Ambulatory Visit: Payer: Self-pay

## 2021-02-28 DIAGNOSIS — G894 Chronic pain syndrome: Secondary | ICD-10-CM

## 2021-02-28 DIAGNOSIS — F1190 Opioid use, unspecified, uncomplicated: Secondary | ICD-10-CM

## 2021-03-02 ENCOUNTER — Other Ambulatory Visit: Payer: Self-pay

## 2021-03-02 ENCOUNTER — Ambulatory Visit: Payer: 59 | Admitting: Physical Therapy

## 2021-03-02 ENCOUNTER — Encounter: Payer: Self-pay | Admitting: Physical Therapy

## 2021-03-02 DIAGNOSIS — F1190 Opioid use, unspecified, uncomplicated: Secondary | ICD-10-CM

## 2021-03-02 DIAGNOSIS — G894 Chronic pain syndrome: Secondary | ICD-10-CM

## 2021-03-02 DIAGNOSIS — R2689 Other abnormalities of gait and mobility: Secondary | ICD-10-CM

## 2021-03-02 DIAGNOSIS — M25672 Stiffness of left ankle, not elsewhere classified: Secondary | ICD-10-CM

## 2021-03-02 DIAGNOSIS — M6281 Muscle weakness (generalized): Secondary | ICD-10-CM

## 2021-03-02 DIAGNOSIS — M25572 Pain in left ankle and joints of left foot: Secondary | ICD-10-CM

## 2021-03-02 MED ORDER — OXYCODONE HCL 10 MG PO TABS: 10.0000 mg | ORAL_TABLET | Freq: Four times a day (QID) | ORAL | 0 refills | Status: DC | PRN

## 2021-03-02 NOTE — Therapy (Signed)
[facility] [address] [place], [place], [address] Phone: [phone]   Fax:  [phone]  Physical Therapy Treatment  Patient Details  Name: Austin [name] MRN: [id number] Date of Birth: 12-Dec-1990 Referring Provider (PT): Thad Ranger, NP   Encounter Date: 03/02/2021   PT End of Session - 03/02/21 0920     Visit Number 8    Number of Visits 13    Date for PT Re-Evaluation 03/11/21    Authorization Type [facility]    PT Start Time 0920    PT Stop Time 1000    PT Time Calculation (min) 40 min    Activity Tolerance Patient tolerated treatment well    Behavior During Therapy [facility] for tasks assessed/performed             Past Medical History:  Diagnosis Date   Acute maxillary sinusitis    Eczema    Sickle cell anemia (HCC) .    Past Surgical History:  Procedure Laterality Date   FOOT SURGERY Left 12/25/2020   WISDOM TOOTH EXTRACTION      There were no vitals filed for this visit.   Subjective Assessment - 03/02/21 0923     Subjective Patient reports he is slowly starting to feel a little bit better, the side of the foot is not as bad but does have some soreness on top of the foot. He does report that when he is on his feet for 4-5 hours he will have more soreness. Patient did cut his yard yesterday and states he is sore but did not have any increase in significant pain.    Patient Stated Goals Get back to walking without pain.    Currently in Pain? Yes    Pain Score 4     Pain Location Foot    Pain Orientation Left    Pain Descriptors / Indicators Sore    Pain Type Chronic pain;Surgical pain    Pain Onset More than a month ago    Pain Frequency Intermittent    Aggravating Factors  Walking or standing extended periods                [facility] PT Assessment - 03/02/21 0001       Observation/Other Assessments   Focus on Therapeutic Outcomes (FOTO)  59% functional status      Strength   Left Ankle  Dorsiflexion 5/5    Left Ankle Plantar Flexion 4-/5    Left Ankle Inversion 4/5    Left Ankle Eversion 4-/5                           [facility] Adult PT Treatment/Exercise - 03/02/21 0001       Exercises   Exercises Ankle      Ankle Exercises: Stretches   Gastroc Stretch 30 seconds    Slant Board Stretch 3 reps;30 seconds      Ankle Exercises: Aerobic   Nustep L5 x 4 min with UE/LE while taking subjective      Ankle Exercises: Standing   SLS 3 x 30 sec on Airex, left only    Heel Raises 15 reps;10 reps   3 sets   Heel Raises Limitations edge of step, SL heel raise flat surface    Other Standing Ankle Exercises Standing SL squat/kick/reach 2 x 10    Other Standing Ankle Exercises Lateral band walk with green around knees 2 x 20  Ankle Exercises: Supine   T-Band 4-way banded ankle with green 2 x 15                    PT Education - 03/02/21 0920     Education Details HEP    Person(s) Educated Patient    Methods Explanation;Demonstration;Verbal cues    Comprehension Verbalized understanding;Returned demonstration;Verbal cues required;Need further instruction              PT Short Term Goals - 02/12/21 1631       PT SHORT TERM GOAL #2   Title Patient will fully weight bear on L LE without CAM boot to begin gait training with no more than 5/10 pain.    Baseline WB only in CAM boot for <10 feet with pain >8/10.    Status Achieved      PT SHORT TERM GOAL #3   Title Patient will improve L DF to neutral to allow gait training to progress toward nomalized pattern.    Baseline L DF=-15deg    Status Achieved               PT Long Term Goals - 01/28/21 1431       PT LONG TERM GOAL #1   Title Patient will be independent with final HEP and progression to continue to reduce pain and symptoms after discharge.    Baseline No exercise program    Time 6    Period Weeks    Status New    Target Date 03/11/21      PT LONG TERM GOAL #2    Title FOTO score will increase to 62% as predicted for improved perception of functional abilities.    Baseline 29%    Time 6    Period Weeks    Status New    Target Date 03/11/21      PT LONG TERM GOAL #3   Title Patient will report walking >20 min with no more than 2/10 pain without assistive device to demonstrate short community mobility.    Baseline Using knee scooter for all mobility out of the house and most in the house.    Time 6    Period Weeks    Status New    Target Date 03/11/21      PT LONG TERM GOAL #4   Title Patient will improve L DF to >10deg to allow up/down steps to enter with reciprocal pattern per home environment.    Baseline Step to pattern with CAM boot and incrased pain/difficulty navigating up/down steps to enter at home.    Time 6    Period Weeks    Status New    Target Date 03/11/21                   Plan - 03/02/21 4098     Clinical Impression Statement Patient tolerated therapy well with no adverse effects. He seems to be progressing well with therapy and tolerated progression in standing strengthening this visit. He did not report any increase in pain and states that the pain on the lateral aspect of the foot has resolved. Updated HEP to continue progressing strength. Patient would benefit from continued skilled PT to progress his ankle mobility and strength in order to reduce pain and maximize functional ability.    PT Treatment/Interventions ADLs/Self Care Home Management;Cryotherapy;Moist Heat;DME Instruction;Gait training;Stair training;Functional mobility training;Therapeutic activities;Therapeutic exercise;Balance training;Neuromuscular re-education;Patient/family education;Manual techniques;Scar mobilization;Passive range of motion;Taping;Vasopneumatic Device    PT Next Visit  Plan Review HEP and progress PRN, progress ankle mobility and strength, balance training    PT Home Exercise Plan [email or web address]    Consulted and Agree with Plan of Care  Patient             Patient will benefit from skilled therapeutic intervention in order to improve the following deficits and impairments:  Abnormal gait, Decreased balance, Decreased endurance, Decreased mobility, Difficulty walking, Impaired sensation, Increased edema, Decreased activity tolerance, Decreased strength, Hypermobility, Impaired flexibility, Pain  Visit Diagnosis: Pain in left ankle and joints of left foot  Stiffness of left ankle, not elsewhere classified  Muscle weakness (generalized)  Other abnormalities of gait and mobility     Problem List Patient Active Problem List   Diagnosis Date Noted   Vitamin D deficiency 11/16/2020   Bilateral lower extremity pain 10/17/2019   Sickle cell anemia without crisis (HCC) 09/20/2019   Chronic pain syndrome 09/20/2019   Nausea 09/20/2019   Chronic, continuous use of opioids 09/20/2019   Medication management 03/20/2019   Bilious vomiting without nausea    Viral gastroenteritis 01/23/2019   Nausea vomiting and diarrhea    Generalized abdominal pain    Hb-SS disease with crisis (HCC)    Dehydration    Sickle cell pain crisis (HCC) 05/18/2018   Acute sickle cell crisis (HCC) 12/15/2017   Thrombocytopenia (HCC) 12/15/2017   Splenomegaly 12/15/2017   Sickle cell anemia with crisis (HCC) 12/15/2017   Spleen enlarged 08/18/2017    Rosana Hoes, PT, DPT, LAT, ATC 03/02/21  10:03 AM Phone: [phone] Fax: [phone]   [facility] [address] [place], [place], [address] Phone: [phone]   Fax:  [phone]  Name: ASIM GERSTEN MRN: [id number] Date of Birth: 04-26-1991

## 2021-03-02 NOTE — Patient Instructions (Signed)
Access Code: [email or web address] URL: [email or web address] Date: 03/02/2021 Prepared by: Rosana Hoes  Exercises Long Sitting Ankle Eversion with Resistance - 1 x daily - 7 x weekly - 2 sets - 20 reps Long Sitting Ankle Inversion with Resistance - 1 x daily - 7 x weekly - 2 sets - 20 reps Gastroc Stretch on Wall - 1 x daily - 7 x weekly - 2 reps - 30 hold Standing Gastroc Stretch on Foam 1/2 Roll - 1 x daily - 7 x weekly - 2 sets - 30 hold Standing Soleus Stretch on Foam 1/2 Roll - 1 x daily - 7 x weekly - 2 sets - 30 hold Heel Raise on Step - 1 x daily - 7 x weekly - 2 sets - 15 reps Standing Single Leg Heel Raise - 1 x daily - 7 x weekly - 2 sets - 10 reps Single Leg Squat with Forward Reach - 1 x daily - 7 x weekly - 2 sets - 3sec hold - 10 reps Single Leg Balance with Clock Reach - 1 x daily - 7 x weekly - 3 sets - 10 reps Side Stepping with Resistance at Thighs - 1 x daily - 7 x weekly - 3 sets - 20 reps

## 2021-03-04 ENCOUNTER — Encounter (HOSPITAL_COMMUNITY): Payer: Self-pay

## 2021-03-04 ENCOUNTER — Emergency Department (HOSPITAL_COMMUNITY)
Admission: EM | Admit: 2021-03-04 | Discharge: 2021-03-04 | Disposition: A | Discharge status: Home / Self Care | Payer: 59 | Source: Home / Self Care | Attending: Emergency Medicine | Admitting: Emergency Medicine

## 2021-03-04 DIAGNOSIS — D5700 Hb-SS disease with crisis, unspecified: Secondary | ICD-10-CM | POA: Insufficient documentation

## 2021-03-04 DIAGNOSIS — Z87891 Personal history of nicotine dependence: Secondary | ICD-10-CM | POA: Insufficient documentation

## 2021-03-04 DIAGNOSIS — R112 Nausea with vomiting, unspecified: Secondary | ICD-10-CM | POA: Insufficient documentation

## 2021-03-04 DIAGNOSIS — R1111 Vomiting without nausea: Secondary | ICD-10-CM | POA: Diagnosis not present

## 2021-03-04 DIAGNOSIS — Z79899 Other long term (current) drug therapy: Secondary | ICD-10-CM | POA: Insufficient documentation

## 2021-03-04 DIAGNOSIS — R61 Generalized hyperhidrosis: Secondary | ICD-10-CM | POA: Insufficient documentation

## 2021-03-04 DIAGNOSIS — I10 Essential (primary) hypertension: Secondary | ICD-10-CM | POA: Diagnosis not present

## 2021-03-04 DIAGNOSIS — R9431 Abnormal electrocardiogram [ECG] [EKG]: Secondary | ICD-10-CM | POA: Diagnosis not present

## 2021-03-04 DIAGNOSIS — I959 Hypotension, unspecified: Secondary | ICD-10-CM | POA: Diagnosis not present

## 2021-03-04 DIAGNOSIS — R42 Dizziness and giddiness: Secondary | ICD-10-CM | POA: Diagnosis not present

## 2021-03-04 LAB — COMPREHENSIVE METABOLIC PANEL
ALT: 38 U/L (ref 0–44)
AST: 17 U/L (ref 15–41)
Albumin: 5.2 g/dL — ABNORMAL HIGH (ref 3.5–5.0)
Alkaline Phosphatase: 50 U/L (ref 38–126)
Anion gap: 9 (ref 5–15)
BUN: 9 mg/dL (ref 6–20)
CO2: 24 mmol/L (ref 22–32)
Calcium: 10 mg/dL (ref 8.9–10.3)
Chloride: 106 mmol/L (ref 98–111)
Creatinine, Ser: 0.72 mg/dL (ref 0.61–1.24)
GFR, Estimated: >60 mL/min (ref >60)
Glucose, Bld: 117 mg/dL — ABNORMAL HIGH (ref 70–99)
Potassium: 4.5 mmol/L (ref 3.5–5.1)
Sodium: 139 mmol/L (ref 135–145)
Total Bilirubin: 1.6 mg/dL — ABNORMAL HIGH (ref 0.3–1.2)
Total Protein: 9.1 g/dL — ABNORMAL HIGH (ref 6.5–8.1)

## 2021-03-04 LAB — CBC WITH DIFFERENTIAL/PLATELET
Abs Immature Granulocytes: 0.11 10*3/uL — ABNORMAL HIGH (ref 0.00–0.07)
Basophils Absolute: 0 10*3/uL (ref 0.0–0.1)
Basophils Relative: 0 %
Eosinophils Absolute: 0 10*3/uL (ref 0.0–0.5)
Eosinophils Relative: 0 %
HCT: 41.1 % (ref 39.0–52.0)
Hemoglobin: 14.2 g/dL (ref 13.0–17.0)
Immature Granulocytes: 1 %
Lymphocytes Relative: 12 %
Lymphs Abs: 1.6 10*3/uL (ref 0.7–4.0)
MCH: 28.1 pg (ref 26.0–34.0)
MCHC: 34.5 g/dL (ref 30.0–36.0)
MCV: 81.4 fL (ref 80.0–100.0)
Monocytes Absolute: 0.8 10*3/uL (ref 0.1–1.0)
Monocytes Relative: 6 %
Neutro Abs: 10.4 10*3/uL — ABNORMAL HIGH (ref 1.7–7.7)
Neutrophils Relative %: 81 %
Platelets: 164 10*3/uL (ref 150–400)
RBC: 5.05 MIL/uL (ref 4.22–5.81)
RDW: 14.8 % (ref 11.5–15.5)
WBC: 13 10*3/uL — ABNORMAL HIGH (ref 4.0–10.5)
nRBC: 0.4 % — ABNORMAL HIGH (ref 0.0–0.2)

## 2021-03-04 LAB — RAPID URINE DRUG SCREEN, HOSP PERFORMED
Amphetamines: NOT DETECTED
Barbiturates: NOT DETECTED
Benzodiazepines: NOT DETECTED
Cocaine: NOT DETECTED
Opiates: NOT DETECTED
Tetrahydrocannabinol: POSITIVE — AB

## 2021-03-04 LAB — RETICULOCYTES
Immature Retic Fract: 29.6 % — ABNORMAL HIGH (ref 2.3–15.9)
RBC.: 4.92 MIL/uL (ref 4.22–5.81)
Retic Count, Absolute: 240.6 10*3/uL — ABNORMAL HIGH (ref 19.0–186.0)
Retic Ct Pct: 4.9 % — ABNORMAL HIGH (ref 0.4–3.1)

## 2021-03-04 MED ORDER — DIPHENHYDRAMINE HCL 50 MG/ML IJ SOLN
25.0000 mg | Freq: Once | INTRAMUSCULAR | Status: DC
  Filled 2021-03-04: qty 1

## 2021-03-04 MED ORDER — HALOPERIDOL LACTATE 5 MG/ML IJ SOLN
5.0000 mg | Freq: Once | INTRAMUSCULAR | Status: AC
  Administered 2021-03-04: 5 mg via INTRAVENOUS
  Filled 2021-03-04: qty 1

## 2021-03-04 MED ORDER — ONDANSETRON HCL 4 MG/2ML IJ SOLN
4.0000 mg | INTRAMUSCULAR | Status: DC | PRN
Start: 2021-03-04 — End: 2021-03-05
  Administered 2021-03-04 (×2): 4 mg via INTRAVENOUS
  Filled 2021-03-04 (×2): qty 2

## 2021-03-04 MED ORDER — HYDROMORPHONE HCL 2 MG/ML IJ SOLN
2.0000 mg | INTRAMUSCULAR | Status: DC
  Filled 2021-03-04: qty 1

## 2021-03-04 MED ORDER — ONDANSETRON 4 MG PO TBDP: 4.0000 mg | ORAL_TABLET | Freq: Three times a day (TID) | ORAL | 0 refills | Status: DC | PRN

## 2021-03-04 MED ORDER — HYDROMORPHONE HCL 2 MG/ML IJ SOLN
2.0000 mg | INTRAMUSCULAR | Status: AC
  Administered 2021-03-04: 2 mg via INTRAVENOUS

## 2021-03-04 MED ORDER — HYDROMORPHONE HCL 2 MG/ML IJ SOLN
2.0000 mg | INTRAMUSCULAR | Status: AC
  Filled 2021-03-04: qty 1

## 2021-03-04 MED ORDER — KETOROLAC TROMETHAMINE 15 MG/ML IJ SOLN: 15.0000 mg | INTRAMUSCULAR | Status: DC

## 2021-03-04 MED ORDER — HYDROMORPHONE HCL 2 MG/ML IJ SOLN: 2.0000 mg | INTRAMUSCULAR | Status: DC

## 2021-03-04 MED ORDER — SODIUM CHLORIDE 0.45 % IV SOLN: INTRAVENOUS | Status: DC

## 2021-03-04 NOTE — ED Notes (Signed)
PA-C at the bedside.  ?

## 2021-03-04 NOTE — Discharge Instructions (Addendum)
It was a pleasure taking care of you today.  As discussed, all of your labs were reassuring.  I am sending you home with nausea medication.  Take as needed for nausea.  Please follow-up with your PCP within the next week for further evaluation.  Return to the ER for any worsening symptoms.

## 2021-03-04 NOTE — ED Provider Notes (Signed)
Care assumed from Fayrene Helper, PA-C at shift change pending pain relief. See his note for full HPI.  In short, patient is a 30 year old male who presents to the ED due to possible sickle cell pain crisis.  Patient states he started having pain in his back and full body last night consistent with previous pain crises.  He also notices nausea and vomiting.  Emesis is nonbloody and nonbilious.  No fever, chills, chest pain, shortness of breath, or productive cough.  Patient admits to frequent marijuana use with last use last night.  Plan from previous provider: Reassessed patient after Dilaudid for pain relief.  Previous provider had no concerned about acute chest or PE.  Previous provider had concerns about cannabinoid hyperemesis syndrome which was treated with haloperidol with improvement.   ED Course/Procedures    Results for orders placed or performed during the hospital encounter of 03/04/21 (from the past 24 hour(s))  Comprehensive metabolic panel     Status: Abnormal   Collection Time: 03/04/21 11:52 AM  Result Value Ref Range   Sodium 139 135 - 145 mmol/L   Potassium 4.5 3.5 - 5.1 mmol/L   Chloride 106 98 - 111 mmol/L   CO2 24 22 - 32 mmol/L   Glucose, Bld 117 (H) 70 - 99 mg/dL   BUN 9 6 - 20 mg/dL   Creatinine, Ser 1.32 0.61 - 1.24 mg/dL   Calcium 44.0 8.9 - 10.2 mg/dL   Total Protein 9.1 (H) 6.5 - 8.1 g/dL   Albumin 5.2 (H) 3.5 - 5.0 g/dL   AST 17 15 - 41 U/L   ALT 38 0 - 44 U/L   Alkaline Phosphatase 50 38 - 126 U/L   Total Bilirubin 1.6 (H) 0.3 - 1.2 mg/dL   GFR, Estimated >72 >53 mL/min   Anion gap 9 5 - 15  CBC with Differential     Status: Abnormal   Collection Time: 03/04/21 11:52 AM  Result Value Ref Range   WBC 13.0 (H) 4.0 - 10.5 K/uL   RBC 5.05 4.22 - 5.81 MIL/uL   Hemoglobin 14.2 13.0 - 17.0 g/dL   HCT 66.4 40.3 - 47.4 %   MCV 81.4 80.0 - 100.0 fL   MCH 28.1 26.0 - 34.0 pg   MCHC 34.5 30.0 - 36.0 g/dL   RDW 25.9 56.3 - 87.5 %   Platelets 164 150 - 400 K/uL    nRBC 0.4 (H) 0.0 - 0.2 %   Neutrophils Relative % 81 %   Neutro Abs 10.4 (H) 1.7 - 7.7 K/uL   Lymphocytes Relative 12 %   Lymphs Abs 1.6 0.7 - 4.0 K/uL   Monocytes Relative 6 %   Monocytes Absolute 0.8 0.1 - 1.0 K/uL   Eosinophils Relative 0 %   Eosinophils Absolute 0.0 0.0 - 0.5 K/uL   Basophils Relative 0 %   Basophils Absolute 0.0 0.0 - 0.1 K/uL   Immature Granulocytes 1 %   Abs Immature Granulocytes 0.11 (H) 0.00 - 0.07 K/uL  Reticulocytes     Status: Abnormal   Collection Time: 03/04/21 11:52 AM  Result Value Ref Range   Retic Ct Pct 4.9 (H) 0.4 - 3.1 %   RBC. 4.92 4.22 - 5.81 MIL/uL   Retic Count, Absolute 240.6 (H) 19.0 - 186.0 K/uL   Immature Retic Fract 29.6 (H) 2.3 - 15.9 %  Rapid urine drug screen (hospital performed)     Status: Abnormal   Collection Time: 03/04/21  2:17 PM  Result Value  Ref Range   Opiates NONE DETECTED NONE DETECTED   Cocaine NONE DETECTED NONE DETECTED   Benzodiazepines NONE DETECTED NONE DETECTED   Amphetamines NONE DETECTED NONE DETECTED   Tetrahydrocannabinol POSITIVE (A) NONE DETECTED   Barbiturates NONE DETECTED NONE DETECTED    Procedures  MDM  Care assumed from Fayrene Helper, PA-C at shift change pending pain relief. See his note for full HPI.  30 year old male presents to the ED due to nausea and vomiting.  Patient also endorses a sickle cell pain crisis.  No chest pain or shortness of breath.  No lower extremity edema.  No infectious symptoms.  Patient endorses frequent marijuana use.  Prior to shift change, patient given haloperidol for presumed cannabinoid hyperemesis syndrome with improvement in symptoms.  CBC significant for mild leukocytosis at 13.  CMP reassuring with normal renal function.  No major electrolyte derangements.  Reticulocytes at baseline.  UDS positive for THC.  EKG personally reviewed which demonstrates normal sinus rhythm with probable early repolarization.  5:52 PM reassessed patient at bedside who notes resolution in  symptoms after 1 dose of Dilaudid.  Patient declines further pain medications.  Patient notes he is ready to go home.  Denies chest pain or shortness of breath.  Low suspicion for acute chest syndrome or PE.  No clinical signs of DVT on exam. Patient able to tolerate po at bedside without difficulty. Patient discharged with zofran as needed for nausea. Strict ED precautions discussed with patient. Patient states understanding and agrees to plan. Patient discharged home in no acute distress and stable vitals         Jesusita Oka 03/04/21 Silva Bandy    Ernie Avena, MD 03/05/21 [phone]

## 2021-03-04 NOTE — ED Notes (Signed)
Provider at the bedside to re-assess.

## 2021-03-04 NOTE — ED Triage Notes (Signed)
Pt presents to the ED via EMS from home for sickle cell crisis. Pt was admitted 10 days ago for same. Pt now c/o N/V, back pain, and bilateral leg pain. Pt was given 500cc NS bolus en route as well as 4mg  Zofran and Fentanyl.

## 2021-03-04 NOTE — ED Notes (Signed)
Urinal at bedside. Pt aware urine sample needed °

## 2021-03-04 NOTE — ED Provider Notes (Signed)
[facility]ENC[facility]PT Provider Note   CSN: [id number] Arrival date & time: 03/04/21  1007     History Chief Complaint  Patient presents with   Sickle Cell Pain Crisis    CRAIGORY TOSTE is a 30 y.o. male.  The history is provided by the patient and medical records. No language interpreter was used.  Sickle Cell Pain Crisis  30 year old male significant history of sickle cell disease presenting complaining of sickle related pain.  Patient endorses last night he has had pain to his back and body.  Pain is achy moderate in severity persistent.  Endorse nausea and vomiting and diaphoretic.  Pain feels similar to prior sickle cell related pain.  No fever chills chest pain shortness of breath or productive cough.  No recent change in his home medication.  Last marijuana use was last night.  Past Medical History:  Diagnosis Date   Acute maxillary sinusitis    Eczema    Sickle cell anemia (HCC) .    Patient Active Problem List   Diagnosis Date Noted   Vitamin D deficiency 11/16/2020   Bilateral lower extremity pain 10/17/2019   Sickle cell anemia without crisis (HCC) 09/20/2019   Chronic pain syndrome 09/20/2019   Nausea 09/20/2019   Chronic, continuous use of opioids 09/20/2019   Medication management 03/20/2019   Bilious vomiting without nausea    Viral gastroenteritis 01/23/2019   Nausea vomiting and diarrhea    Generalized abdominal pain    Hb-SS disease with crisis (HCC)    Dehydration    Sickle cell pain crisis (HCC) 05/18/2018   Acute sickle cell crisis (HCC) 12/15/2017   Thrombocytopenia (HCC) 12/15/2017   Splenomegaly 12/15/2017   Sickle cell anemia with crisis (HCC) 12/15/2017   Spleen enlarged 08/18/2017    Past Surgical History:  Procedure Laterality Date   FOOT SURGERY Left 12/25/2020   WISDOM TOOTH EXTRACTION         Family History  Problem Relation Age of Onset   Sickle cell trait Mother    Diabetes Father     Hypertension Father     Social History   Tobacco Use   Smoking status: Former    Packs/day: 0.10    Types: Cigarettes   Smokeless tobacco: Never  Vaping Use   Vaping Use: Never used  Substance Use Topics   Alcohol use: Yes    Comment: occ   Drug use: Yes    Types: Marijuana    Home Medications Prior to Admission medications   Medication Sig Start Date End Date Taking? Authorizing Provider  dicyclomine (BENTYL) 20 MG tablet Take 1 tablet (20 mg total) by mouth 2 (two) times daily. 02/26/21   Barbette Merino, NP  ibuprofen (ADVIL) 800 MG tablet Take 1 tablet (800 mg total) by mouth every 8 (eight) hours as needed. Patient taking differently: Take 800 mg by mouth every 8 (eight) hours as needed for mild pain. 01/14/21   Barbette Merino, NP  lidocaine (LIDODERM) 5 % Place 1 patch onto the skin daily. Remove & Discard patch within 12 hours or as directed by MD 02/09/21   Gwenevere Abbot, MD  loratadine (CLARITIN) 10 MG tablet Take 1 tablet (10 mg total) by mouth daily. Patient taking differently: Take 10 mg by mouth daily as needed for allergies. 11/13/20   Barbette Merino, NP  Olopatadine HCl 0.2 % SOLN Apply 1 drop to eye in the morning and at bedtime. Patient taking differently: Apply 1 drop to  eye 2 (two) times daily as needed (allergy symptoms). 11/13/20 11/13/21  Barbette Merino, NP  ondansetron (ZOFRAN ODT) 4 MG disintegrating tablet Take 1 tablet (4 mg total) by mouth every 8 (eight) hours as needed for nausea or vomiting. 02/26/21   Barbette Merino, NP  Oxycodone HCl 10 MG TABS Take 1 tablet (10 mg total) by mouth 4 (four) times daily as needed for up to 15 days. 03/02/21 03/17/21  Quentin Angst, MD  promethazine (PHENERGAN) 25 MG tablet Take 1 tablet (25 mg total) by mouth every 6 (six) hours as needed for nausea or vomiting. 01/29/21   Barbette Merino, NP    Allergies    Patient has no known allergies.  Review of Systems   Review of Systems  All other systems reviewed and are  negative.  Physical Exam Updated Vital Signs BP (!) 128/91   Pulse 72   Temp 97.6 F (36.4 C) (Oral)   Resp 16   Ht 5\' 7"  (1.702 m)   Wt 88.9 kg   SpO2 98%   BMI 30.70 kg/m   Physical Exam Vitals and nursing note reviewed.  Constitutional:      Appearance: He is well-developed. He is diaphoretic.     Comments: Patient appears uncomfortable  HENT:     Head: Atraumatic.  Eyes:     Conjunctiva/sclera: Conjunctivae normal.  Cardiovascular:     Rate and Rhythm: Normal rate and regular rhythm.     Pulses: Normal pulses.     Heart sounds: Normal heart sounds.  Pulmonary:     Effort: Pulmonary effort is normal.     Breath sounds: Normal breath sounds.  Abdominal:     Palpations: Abdomen is soft.  Musculoskeletal:        General: No tenderness (No significant reproducible midline spine tenderness).     Cervical back: Neck supple.  Skin:    Findings: No rash.  Neurological:     Mental Status: He is alert. Mental status is at baseline.  Psychiatric:        Mood and Affect: Mood normal.    ED Results / Procedures / Treatments   Labs (all labs ordered are listed, but only abnormal results are displayed) Labs Reviewed  COMPREHENSIVE METABOLIC PANEL - Abnormal; Notable for the following components:      Result Value   Glucose, Bld 117 (*)    Total Protein 9.1 (*)    Albumin 5.2 (*)    Total Bilirubin 1.6 (*)    All other components within normal limits  CBC WITH DIFFERENTIAL/PLATELET - Abnormal; Notable for the following components:   WBC 13.0 (*)    nRBC 0.4 (*)    Neutro Abs 10.4 (*)    Abs Immature Granulocytes 0.11 (*)    All other components within normal limits  RETICULOCYTES - Abnormal; Notable for the following components:   Retic Ct Pct 4.9 (*)    Retic Count, Absolute 240.6 (*)    Immature Retic Fract 29.6 (*)    All other components within normal limits  RAPID URINE DRUG SCREEN, HOSP PERFORMED    EKG None ED ECG REPORT   Date: 03/04/2021  Rate:  78  Rhythm: normal sinus rhythm  QRS Axis: normal  Intervals: normal  ST/T Wave abnormalities: nonspecific ST changes  Conduction Disutrbances:none  Narrative Interpretation:   Old EKG Reviewed: none available  I have personally reviewed the EKG tracing and agree with the computerized printout as noted.   Radiology No  results found.  Procedures Procedures   Medications Ordered in ED Medications  0.45 % sodium chloride infusion ( Intravenous New Bag/Given 03/04/21 1217)  diphenhydrAMINE (BENADRYL) injection 25 mg (0 mg Intravenous Hold 03/04/21 1247)  ondansetron (ZOFRAN) injection 4 mg (4 mg Intravenous Given 03/04/21 1228)  ketorolac (TORADOL) 15 MG/ML injection 15 mg (has no administration in time range)  HYDROmorphone (DILAUDID) injection 2 mg (has no administration in time range)  HYDROmorphone (DILAUDID) injection 2 mg (has no administration in time range)  haloperidol lactate (HALDOL) injection 5 mg (5 mg Intravenous Given 03/04/21 1248)    ED Course  I have reviewed the triage vital signs and the nursing notes.  Pertinent labs & imaging results that were available during my care of the patient were reviewed by me and considered in my medical decision making (see chart for details).    MDM Rules/Calculators/A&P                         BP (!) 142/91   Pulse 80   Temp 97.6 F (36.4 C) (Oral)   Resp 16   Ht 5\' 7"  (1.702 m)   Wt 88.5 kg   SpO2 100%   BMI 30.56 kg/m    BP (!) 142/91   Pulse 80   Temp 97.6 F (36.4 C) (Oral)   Resp 16   Ht 5\' 7"  (1.702 m)   Wt 88.5 kg   SpO2 100%   BMI 30.56 kg/m   Final Clinical Impression(s) / ED Diagnoses Final diagnoses:  Sickle-cell disease with pain (HCC)    Rx / DC Orders ED Discharge Orders     None      12:16 PM Patient with history of sickle cell disease here with complaints of sickle cell related pain which includes back pain.  However his presentation is more suggestive of cannabinoid hyperemesis  syndrome.  He is diaphoretic, he does not have any true reproducible midline spine tenderness.  White count is 13, hemoglobin is 14.2.  Will obtain EKG, if normal QT will consider treating symptoms with Haldol.  12:38 PM EKG without evidence of prolonged QT, will hold off on opiate medication and give Haldol instead for symptom control.  2:30 PM Patient did receive Haldol and reported mild improvement of symptoms however he still having quite a bit of pain.  At this time will follow through with his sickle cell pain protocol and provide appropriate pain treatment.  3:19 PM Pt sign out to oncoming provider who will reassess pt once he have received appropriate pain management per protocol, and will determine disposition.    , PA-C 03/04/21 1519    Fayrene Helper, DO 03/04/21 2137

## 2021-03-05 ENCOUNTER — Encounter (HOSPITAL_COMMUNITY): Payer: Self-pay

## 2021-03-05 ENCOUNTER — Other Ambulatory Visit: Payer: Self-pay

## 2021-03-05 ENCOUNTER — Ambulatory Visit: Payer: 59 | Admitting: Physical Therapy

## 2021-03-05 ENCOUNTER — Inpatient Hospital Stay (HOSPITAL_COMMUNITY)
Admission: EM | Admit: 2021-03-05 | Discharge: 2021-03-07 | DRG: 812 | Disposition: A | Discharge status: Home / Self Care | Payer: 59 | Attending: Internal Medicine | Admitting: Internal Medicine

## 2021-03-05 DIAGNOSIS — Z87891 Personal history of nicotine dependence: Secondary | ICD-10-CM | POA: Diagnosis not present

## 2021-03-05 DIAGNOSIS — D638 Anemia in other chronic diseases classified elsewhere: Secondary | ICD-10-CM | POA: Diagnosis not present

## 2021-03-05 DIAGNOSIS — R1110 Vomiting, unspecified: Secondary | ICD-10-CM | POA: Diagnosis not present

## 2021-03-05 DIAGNOSIS — Z832 Family history of diseases of the blood and blood-forming organs and certain disorders involving the immune mechanism: Secondary | ICD-10-CM

## 2021-03-05 DIAGNOSIS — I10 Essential (primary) hypertension: Secondary | ICD-10-CM | POA: Diagnosis not present

## 2021-03-05 DIAGNOSIS — D72828 Other elevated white blood cell count: Secondary | ICD-10-CM | POA: Diagnosis present

## 2021-03-05 DIAGNOSIS — G894 Chronic pain syndrome: Secondary | ICD-10-CM | POA: Diagnosis present

## 2021-03-05 DIAGNOSIS — D5700 Hb-SS disease with crisis, unspecified: Secondary | ICD-10-CM | POA: Diagnosis not present

## 2021-03-05 DIAGNOSIS — E876 Hypokalemia: Secondary | ICD-10-CM | POA: Diagnosis not present

## 2021-03-05 DIAGNOSIS — E559 Vitamin D deficiency, unspecified: Secondary | ICD-10-CM | POA: Diagnosis not present

## 2021-03-05 DIAGNOSIS — Z8249 Family history of ischemic heart disease and other diseases of the circulatory system: Secondary | ICD-10-CM | POA: Diagnosis not present

## 2021-03-05 DIAGNOSIS — Z20822 Contact with and (suspected) exposure to covid-19: Secondary | ICD-10-CM | POA: Diagnosis present

## 2021-03-05 DIAGNOSIS — Z79899 Other long term (current) drug therapy: Secondary | ICD-10-CM

## 2021-03-05 DIAGNOSIS — R110 Nausea: Secondary | ICD-10-CM | POA: Diagnosis not present

## 2021-03-05 DIAGNOSIS — R112 Nausea with vomiting, unspecified: Secondary | ICD-10-CM | POA: Diagnosis not present

## 2021-03-05 DIAGNOSIS — R1111 Vomiting without nausea: Secondary | ICD-10-CM | POA: Diagnosis not present

## 2021-03-05 LAB — CBC WITH DIFFERENTIAL/PLATELET
Abs Immature Granulocytes: 0.08 10*3/uL — ABNORMAL HIGH (ref 0.00–0.07)
Basophils Absolute: 0 10*3/uL (ref 0.0–0.1)
Basophils Relative: 0 %
Eosinophils Absolute: 0 10*3/uL (ref 0.0–0.5)
Eosinophils Relative: 0 %
HCT: 35.2 % — ABNORMAL LOW (ref 39.0–52.0)
Hemoglobin: 12.6 g/dL — ABNORMAL LOW (ref 13.0–17.0)
Immature Granulocytes: 1 %
Lymphocytes Relative: 15 %
Lymphs Abs: 1.8 10*3/uL (ref 0.7–4.0)
MCH: 29 pg (ref 26.0–34.0)
MCHC: 35.8 g/dL (ref 30.0–36.0)
MCV: 80.9 fL (ref 80.0–100.0)
Monocytes Absolute: 0.8 10*3/uL (ref 0.1–1.0)
Monocytes Relative: 7 %
Neutro Abs: 9.3 10*3/uL — ABNORMAL HIGH (ref 1.7–7.7)
Neutrophils Relative %: 77 %
Platelets: 132 10*3/uL — ABNORMAL LOW (ref 150–400)
RBC: 4.35 MIL/uL (ref 4.22–5.81)
RDW: 14.7 % (ref 11.5–15.5)
WBC: 12 10*3/uL — ABNORMAL HIGH (ref 4.0–10.5)
nRBC: 0.3 % — ABNORMAL HIGH (ref 0.0–0.2)

## 2021-03-05 LAB — COMPREHENSIVE METABOLIC PANEL
ALT: 29 U/L (ref 0–44)
AST: 12 U/L — ABNORMAL LOW (ref 15–41)
Albumin: 3.9 g/dL (ref 3.5–5.0)
Alkaline Phosphatase: 41 U/L (ref 38–126)
Anion gap: 8 (ref 5–15)
BUN: 8 mg/dL (ref 6–20)
CO2: 20 mmol/L — ABNORMAL LOW (ref 22–32)
Calcium: 7.9 mg/dL — ABNORMAL LOW (ref 8.9–10.3)
Chloride: 109 mmol/L (ref 98–111)
Creatinine, Ser: 0.71 mg/dL (ref 0.61–1.24)
GFR, Estimated: >60 mL/min (ref >60)
Glucose, Bld: 101 mg/dL — ABNORMAL HIGH (ref 70–99)
Potassium: 2.9 mmol/L — ABNORMAL LOW (ref 3.5–5.1)
Sodium: 137 mmol/L (ref 135–145)
Total Bilirubin: 2 mg/dL — ABNORMAL HIGH (ref 0.3–1.2)
Total Protein: 6.8 g/dL (ref 6.5–8.1)

## 2021-03-05 LAB — RETICULOCYTES
Immature Retic Fract: 31.2 % — ABNORMAL HIGH (ref 2.3–15.9)
RBC.: 4.38 MIL/uL (ref 4.22–5.81)
Retic Count, Absolute: 204.1 10*3/uL — ABNORMAL HIGH (ref 19.0–186.0)
Retic Ct Pct: 4.7 % — ABNORMAL HIGH (ref 0.4–3.1)

## 2021-03-05 LAB — LIPASE, BLOOD: Lipase: 25 U/L (ref 11–51)

## 2021-03-05 MED ORDER — POTASSIUM CHLORIDE CRYS ER 20 MEQ PO TBCR
40.0000 meq | EXTENDED_RELEASE_TABLET | Freq: Once | ORAL | Status: AC
  Administered 2021-03-05: 40 meq via ORAL
  Filled 2021-03-05: qty 2

## 2021-03-05 MED ORDER — KETOROLAC TROMETHAMINE 15 MG/ML IJ SOLN
15.0000 mg | Freq: Once | INTRAMUSCULAR | Status: AC
  Administered 2021-03-05: 15 mg via INTRAVENOUS
  Filled 2021-03-05: qty 1

## 2021-03-05 MED ORDER — MAGNESIUM OXIDE -MG SUPPLEMENT 400 (240 MG) MG PO TABS
800.0000 mg | ORAL_TABLET | Freq: Once | ORAL | Status: AC
  Administered 2021-03-05: 800 mg via ORAL
  Filled 2021-03-05: qty 2

## 2021-03-05 MED ORDER — SODIUM CHLORIDE 0.9% FLUSH: 9.0000 mL | INTRAVENOUS | Status: DC | PRN

## 2021-03-05 MED ORDER — SENNOSIDES-DOCUSATE SODIUM 8.6-50 MG PO TABS
1.0000 | ORAL_TABLET | Freq: Two times a day (BID) | ORAL | Status: DC
  Administered 2021-03-05 – 2021-03-07 (×5): 1 via ORAL
  Filled 2021-03-05 (×5): qty 1

## 2021-03-05 MED ORDER — ONDANSETRON HCL 4 MG/2ML IJ SOLN
4.0000 mg | Freq: Once | INTRAMUSCULAR | Status: AC
  Administered 2021-03-05: 4 mg via INTRAVENOUS
  Filled 2021-03-05: qty 2

## 2021-03-05 MED ORDER — DIPHENHYDRAMINE HCL 25 MG PO CAPS
25.0000 mg | ORAL_CAPSULE | ORAL | Status: DC | PRN
  Administered 2021-03-05 – 2021-03-06 (×2): 25 mg via ORAL
  Filled 2021-03-05 (×2): qty 1

## 2021-03-05 MED ORDER — HYDROMORPHONE HCL 2 MG/ML IJ SOLN
2.0000 mg | INTRAMUSCULAR | Status: AC
  Administered 2021-03-05: 2 mg via INTRAVENOUS
  Filled 2021-03-05: qty 1

## 2021-03-05 MED ORDER — POLYETHYLENE GLYCOL 3350 17 G PO PACK: 17.0000 g | PACK | Freq: Every day | ORAL | Status: DC | PRN

## 2021-03-05 MED ORDER — SODIUM CHLORIDE 0.45 % IV SOLN: INTRAVENOUS | Status: DC

## 2021-03-05 MED ORDER — SODIUM CHLORIDE 0.9 % IV SOLN
25.0000 mg | INTRAVENOUS | Status: DC | PRN
  Filled 2021-03-05: qty 0.5

## 2021-03-05 MED ORDER — HYDROMORPHONE HCL 2 MG/ML IJ SOLN
2.0000 mg | Freq: Once | INTRAMUSCULAR | Status: AC
  Administered 2021-03-05: 2 mg via INTRAVENOUS
  Filled 2021-03-05: qty 1

## 2021-03-05 MED ORDER — ONDANSETRON HCL 4 MG/2ML IJ SOLN
4.0000 mg | Freq: Four times a day (QID) | INTRAMUSCULAR | Status: DC | PRN
  Administered 2021-03-05 – 2021-03-07 (×4): 4 mg via INTRAVENOUS
  Filled 2021-03-05 (×4): qty 2

## 2021-03-05 MED ORDER — HYDROMORPHONE 1 MG/ML IV SOLN
INTRAVENOUS | Status: DC
  Administered 2021-03-05: 1 mg via INTRAVENOUS
  Administered 2021-03-05: 3.5 mg via INTRAVENOUS
  Administered 2021-03-06: 3 mg via INTRAVENOUS
  Administered 2021-03-06: 0.5 mg via INTRAVENOUS
  Administered 2021-03-06: 3 mg via INTRAVENOUS
  Administered 2021-03-06: 2.5 mg via INTRAVENOUS
  Administered 2021-03-06: 1 mg via INTRAVENOUS
  Administered 2021-03-07: 3 mg via INTRAVENOUS
  Administered 2021-03-07: 2.5 mL via INTRAVENOUS
  Administered 2021-03-07: 4.5 mg via INTRAVENOUS
  Administered 2021-03-07: 6 mg via INTRAVENOUS
  Filled 2021-03-05 (×2): qty 30

## 2021-03-05 MED ORDER — KETOROLAC TROMETHAMINE 15 MG/ML IJ SOLN
15.0000 mg | Freq: Four times a day (QID) | INTRAMUSCULAR | Status: DC
  Administered 2021-03-05 – 2021-03-07 (×8): 15 mg via INTRAVENOUS
  Filled 2021-03-05 (×8): qty 1

## 2021-03-05 MED ORDER — ENOXAPARIN SODIUM 40 MG/0.4ML IJ SOSY
40.0000 mg | PREFILLED_SYRINGE | INTRAMUSCULAR | Status: DC
  Administered 2021-03-05 – 2021-03-06 (×2): 40 mg via SUBCUTANEOUS
  Filled 2021-03-05 (×2): qty 0.4

## 2021-03-05 MED ORDER — NALOXONE HCL 0.4 MG/ML IJ SOLN: 0.4000 mg | INTRAMUSCULAR | Status: DC | PRN

## 2021-03-05 MED ORDER — KETOROLAC TROMETHAMINE 15 MG/ML IJ SOLN: 15.0000 mg | Freq: Four times a day (QID) | INTRAMUSCULAR | Status: DC

## 2021-03-05 MED ORDER — OXYCODONE HCL 5 MG PO TABS
10.0000 mg | ORAL_TABLET | Freq: Four times a day (QID) | ORAL | Status: DC | PRN
  Administered 2021-03-05 – 2021-03-06 (×3): 10 mg via ORAL
  Filled 2021-03-05 (×3): qty 2

## 2021-03-05 NOTE — ED Triage Notes (Signed)
Pt BIB EMS. Pt c/o SCC, n/v and lower back pain. EMS gave fluids and 4mg  zofran. Pt is currently vomiting.

## 2021-03-05 NOTE — H&P (Signed)
H&P  Patient Demographics:  Austin [name], is a 30 y.o. male  MRN: [id number]   DOB - Aug 16, 1990  Admit Date - 03/05/2021  Outpatient Primary MD for the patient is Barbette Merino, NP  Chief Complaint  Patient presents with   Sickle Cell Pain Crisis   Nausea   Vomiting     HPI:   Austin [name]  is a 30 y.o. male with a medical history significant for sickle cell disease, chronic pain syndrome, opiate dependence and tolerance, and anemia of chronic disease who presented to the emergency department today, second day in a row with major complaints of generalized body pain that is typical of his sickle cell pain crisis.  He also complains of vomiting for the past 2 months, that is usually in the morning, continue recently ingested food, sometimes bilious but nonbloody, nonprojectile.  Patient presented with the same complaint yesterday but requested to be discharged home because he felt better after pain management.  He was also given Haldol because his symptoms were thought to be likely due to marijuana hyperemesis syndrome.  He denies any fever, cough, chest pain, abdominal pain or distention, constipation or diarrhea.  Patient claims he is still able to hold food down especially in the afternoon and towards the evening but usually unable to do that in the morning.  ED course: Patient was found to be hemodynamically stable with normal vital signs, his abdominal examination revealed some epigastric tenderness.  There was bilateral paraspinal lumbar muscles  tenderness.  Laboratory evaluation reveals a mild leukocytosis of 12, stable hemoglobin of 12.6, reticulocyte count of 4.1% and his comprehensive metabolic panel revealed hypokalemia of 2.9 and CO2 of 20.  His COVID screening was negative.  We are called to admit patient for further evaluation and management of intractable vomiting and sickle cell pain crisis.   Review of systems:  In addition to the HPI above, patient reports No fever or  chills No Headache, No changes with vision or hearing No problems swallowing food or liquids No chest pain, cough or shortness of breath No abdominal pain, No nausea or vomiting, Bowel movements are regular No blood in stool or urine No dysuria No new skin rashes or bruises No new joints pains-aches No new weakness, tingling, numbness in any extremity No recent weight gain or loss No polyuria, polydypsia or polyphagia No significant Mental Stressors  A full 10 point Review of Systems was done, except as stated above, all other Review of Systems were negative.  With Past History of the following :   Past Medical History:  Diagnosis Date   Acute maxillary sinusitis    Eczema    Sickle cell anemia (HCC) .      Past Surgical History:  Procedure Laterality Date   FOOT SURGERY Left 12/25/2020   WISDOM TOOTH EXTRACTION       Social History:   Social History   Tobacco Use   Smoking status: Former    Packs/day: 0.10    Types: Cigarettes   Smokeless tobacco: Never  Substance Use Topics   Alcohol use: Yes    Comment: occ     Lives - At home   Family History :   Family History  Problem Relation Age of Onset   Sickle cell trait Mother    Diabetes Father    Hypertension Father      Home Medications:   Prior to Admission medications   Medication Sig Start Date End Date Taking? Authorizing Provider  dicyclomine (  BENTYL) 20 MG tablet Take 1 tablet (20 mg total) by mouth 2 (two) times daily. 02/26/21   Barbette Merino, NP  ibuprofen (ADVIL) 800 MG tablet Take 1 tablet (800 mg total) by mouth every 8 (eight) hours as needed. Patient taking differently: Take 800 mg by mouth every 8 (eight) hours as needed for mild pain. 01/14/21   Barbette Merino, NP  lidocaine (LIDODERM) 5 % Place 1 patch onto the skin daily. Remove & Discard patch within 12 hours or as directed by MD 02/09/21   Gwenevere Abbot, MD  loratadine (CLARITIN) 10 MG tablet Take 1 tablet (10 mg total) by mouth  daily. Patient taking differently: Take 10 mg by mouth daily as needed for allergies. 11/13/20   Barbette Merino, NP  Olopatadine HCl 0.2 % SOLN Apply 1 drop to eye in the morning and at bedtime. Patient taking differently: Apply 1 drop to eye 2 (two) times daily as needed (allergy symptoms). 11/13/20 11/13/21  Barbette Merino, NP  ondansetron (ZOFRAN ODT) 4 MG disintegrating tablet Take 1 tablet (4 mg total) by mouth every 8 (eight) hours as needed for nausea or vomiting. 03/04/21   Mannie Stabile, PA-C  Oxycodone HCl 10 MG TABS Take 1 tablet (10 mg total) by mouth 4 (four) times daily as needed for up to 15 days. 03/02/21 03/17/21  Quentin Angst, MD  promethazine (PHENERGAN) 25 MG tablet Take 1 tablet (25 mg total) by mouth every 6 (six) hours as needed for nausea or vomiting. 01/29/21   Barbette Merino, NP     Allergies:   No Known Allergies   Physical Exam:   Vitals:   Vitals:   03/05/21 1000 03/05/21 1045  BP: 116/71 119/73  Pulse: 82 78  Resp: 18 18  Temp:    SpO2: 99% 96%    Physical Exam: Constitutional: Patient appears well-developed and well-nourished. Not in obvious distress but acutely ill looking HENT: Normocephalic, atraumatic, External right and left ear normal. Oropharynx is clear and moist.  Eyes: Conjunctivae and EOM are normal. PERRLA, no scleral icterus. Neck: Normal ROM. Neck supple. No JVD. No tracheal deviation. No thyromegaly. CVS: RRR, S1/S2 +, no murmurs, no gallops, no carotid bruit.  Pulmonary: Effort and breath sounds normal, no stridor, rhonchi, wheezes, rales.  Abdominal: Mild epigastric tenderness, soft. BS +, no distension, rebound or guarding.  Musculoskeletal: Normal range of motion. No edema and no tenderness.  Lymphadenopathy: No lymphadenopathy noted, cervical, inguinal or axillary Neuro: Alert. Normal reflexes, muscle tone coordination. No cranial nerve deficit. Skin: Skin is warm and dry. No rash noted. Not diaphoretic. No erythema. No  pallor. Psychiatric: Normal mood and affect. Behavior, judgment, thought content normal.   Data Review:   CBC Recent Labs  Lab 03/04/21 1152 03/05/21 0845  WBC 13.0* 12.0*  HGB 14.2 12.6*  HCT 41.1 35.2*  PLT 164 132*  MCV 81.4 80.9  MCH 28.1 29.0  MCHC 34.5 35.8  RDW 14.8 14.7  LYMPHSABS 1.6 1.8  MONOABS 0.8 0.8  EOSABS 0.0 0.0  BASOSABS 0.0 0.0   ------------------------------------------------------------------------------------------------------------------  Chemistries  Recent Labs  Lab 03/04/21 1152 03/05/21 0845  NA 139 137  K 4.5 2.9*  CL 106 109  CO2 24 20*  GLUCOSE 117* 101*  BUN 9 8  CREATININE 0.72 0.71  CALCIUM 10.0 7.9*  AST 17 12*  ALT 38 29  ALKPHOS 50 41  BILITOT 1.6* 2.0*   ------------------------------------------------------------------------------------------------------------------ estimated creatinine clearance is 142.1 mL/min (by C-G  formula based on SCr of 0.71 mg/dL). ------------------------------------------------------------------------------------------------------------------ No results for input(s): TSH, T4TOTAL, T3FREE, THYROIDAB in the last 72 hours.  Invalid input(s): FREET3  Coagulation profile No results for input(s): INR, PROTIME in the last 168 hours. ------------------------------------------------------------------------------------------------------------------- No results for input(s): DDIMER in the last 72 hours. -------------------------------------------------------------------------------------------------------------------  Cardiac Enzymes No results for input(s): CKMB, TROPONINI, MYOGLOBIN in the last 168 hours.  Invalid input(s): CK ------------------------------------------------------------------------------------------------------------------ No results found for: BNP  ---------------------------------------------------------------------------------------------------------------  Urinalysis     Component Value Date/Time   COLORURINE YELLOW 05/21/2019 2325   APPEARANCEUR HAZY (A) 05/21/2019 2325   LABSPEC 1.020 05/21/2019 2325   PHURINE 8.0 05/21/2019 2325   GLUCOSEU 50 (A) 05/21/2019 2325   HGBUR NEGATIVE 05/21/2019 2325   BILIRUBINUR negative 02/05/2020 0749   KETONESUR NEGATIVE 05/21/2019 2325   PROTEINUR Negative 02/05/2020 0749   PROTEINUR NEGATIVE 05/21/2019 2325   UROBILINOGEN 2.0 (A) 02/05/2020 0749   UROBILINOGEN 1.0 10/18/2017 0841   NITRITE negative 02/05/2020 0749   NITRITE NEGATIVE 05/21/2019 2325   LEUKOCYTESUR Negative 02/05/2020 0749   LEUKOCYTESUR NEGATIVE 05/21/2019 2325    ----------------------------------------------------------------------------------------------------------------   Imaging Results:    No results found.   Assessment & Plan:  Principal Problem:   Sickle cell anemia with crisis (HCC) Active Problems:   Chronic pain syndrome   Hypokalemia   Anemia of chronic disease  Hb Sickle Cell Disease with crisis: Admit patient, start IVF D5 .45% Saline @ 125 mls/hour, start weight based Dilaudid PCA, start IV Toradol 15 mg Q 6 H, Restart oral home pain medications, Monitor vitals very closely, Re-evaluate pain scale regularly, 2 L of Oxygen by [place], Patient will be re-evaluated for pain in the context of function and relationship to baseline as care progresses. Nausea and vomiting: Patient has had vomiting for over 2 months mostly in the morning.  He is hypokalemic as a result.  IV fluid, IV promethazine and Zofran.  If there is no significant improvement or sustained relief, will order CT abdomen and pelvis and consider GI consult for upper GI endoscopy. Hypokalemia: Replete and repeat labs in AM. Leukocytosis: Mild.  Most likely as a result of sickle cell crisis, will observe without antibiotics for now.  Monitor closely and repeat labs in AM. Sickle Cell Anemia: Hemoglobin is stable at baseline.  There is no clinical indication for blood  transfusion at this time. Chronic pain Syndrome: Restart and continue home pain medications. Marijuana use disorder: Patient counseled extensively about the possibility of marijuana hyperemesis syndrome that may account for or contribute to the present symptoms.  DVT Prophylaxis: Subcut Lovenox   AM Labs Ordered, also please review Full Orders  Family Communication: Admission, patient's condition and plan of care including tests being ordered have been discussed with the patient who indicate understanding and agree with the plan and Code Status.  Code Status: Full Code  Consults called: None    Admission status: Inpatient    Time spent in minutes : 50 minutes  Jeanann Lewandowsky MD, MHA, CPE, FACP 03/05/2021 at 11:37 AM

## 2021-03-05 NOTE — ED Provider Notes (Signed)
[facility]-EMERGENCY DEPT Provider Note   CSN: [id number] Arrival date & time: 03/05/21  2725     History Chief Complaint  Patient presents with   Sickle Cell Pain Crisis   Nausea   Vomiting    Austin [name] is a 30 y.o. male with PMH sickle cell disease with frequent presentations for sickle cell pain crisis who presents to the emergency department for evaluation of back pain, nausea, vomiting.  Patient was seen in the emergency department yesterday for similar symptoms and was ultimately discharged after receiving Dilaudid and Haldol for nausea.  He states that his symptoms were mildly improved upon discharge but at midnight last night, his symptoms returned and he has had greater than 10 episodes of nonbloody nonbilious emesis since midnight.  He states he has not smoked any marijuana since discharge.  Denies fevers, chest pain, shortness of breath, cough or other systemic symptoms.   Sickle Cell Pain Crisis Associated symptoms: nausea and vomiting   Associated symptoms: no chest pain, no cough, no fever, no shortness of breath and no sore throat       Past Medical History:  Diagnosis Date   Acute maxillary sinusitis    Eczema    Sickle cell anemia (HCC) .    Patient Active Problem List   Diagnosis Date Noted   Vitamin D deficiency 11/16/2020   Bilateral lower extremity pain 10/17/2019   Sickle cell anemia without crisis (HCC) 09/20/2019   Chronic pain syndrome 09/20/2019   Nausea 09/20/2019   Chronic, continuous use of opioids 09/20/2019   Medication management 03/20/2019   Bilious vomiting without nausea    Viral gastroenteritis 01/23/2019   Nausea vomiting and diarrhea    Generalized abdominal pain    Hb-SS disease with crisis (HCC)    Dehydration    Sickle cell pain crisis (HCC) 05/18/2018   Acute sickle cell crisis (HCC) 12/15/2017   Thrombocytopenia (HCC) 12/15/2017   Splenomegaly 12/15/2017   Sickle cell anemia with crisis (HCC)  12/15/2017   Spleen enlarged 08/18/2017    Past Surgical History:  Procedure Laterality Date   FOOT SURGERY Left 12/25/2020   WISDOM TOOTH EXTRACTION         Family History  Problem Relation Age of Onset   Sickle cell trait Mother    Diabetes Father    Hypertension Father     Social History   Tobacco Use   Smoking status: Former    Packs/day: 0.10    Types: Cigarettes   Smokeless tobacco: Never  Vaping Use   Vaping Use: Never used  Substance Use Topics   Alcohol use: Yes    Comment: occ   Drug use: Yes    Types: Marijuana    Home Medications Prior to Admission medications   Medication Sig Start Date End Date Taking? Authorizing Provider  dicyclomine (BENTYL) 20 MG tablet Take 1 tablet (20 mg total) by mouth 2 (two) times daily. 02/26/21   Barbette Merino, NP  ibuprofen (ADVIL) 800 MG tablet Take 1 tablet (800 mg total) by mouth every 8 (eight) hours as needed. Patient taking differently: Take 800 mg by mouth every 8 (eight) hours as needed for mild pain. 01/14/21   Barbette Merino, NP  lidocaine (LIDODERM) 5 % Place 1 patch onto the skin daily. Remove & Discard patch within 12 hours or as directed by MD 02/09/21   Gwenevere Abbot, MD  loratadine (CLARITIN) 10 MG tablet Take 1 tablet (10 mg total) by mouth  daily. Patient taking differently: Take 10 mg by mouth daily as needed for allergies. 11/13/20   Barbette Merino, NP  Olopatadine HCl 0.2 % SOLN Apply 1 drop to eye in the morning and at bedtime. Patient taking differently: Apply 1 drop to eye 2 (two) times daily as needed (allergy symptoms). 11/13/20 11/13/21  Barbette Merino, NP  ondansetron (ZOFRAN ODT) 4 MG disintegrating tablet Take 1 tablet (4 mg total) by mouth every 8 (eight) hours as needed for nausea or vomiting. 02/26/21   Barbette Merino, NP  ondansetron (ZOFRAN ODT) 4 MG disintegrating tablet Take 1 tablet (4 mg total) by mouth every 8 (eight) hours as needed for nausea or vomiting. 03/04/21   Mannie Stabile, PA-C   Oxycodone HCl 10 MG TABS Take 1 tablet (10 mg total) by mouth 4 (four) times daily as needed for up to 15 days. 03/02/21 03/17/21  Quentin Angst, MD  promethazine (PHENERGAN) 25 MG tablet Take 1 tablet (25 mg total) by mouth every 6 (six) hours as needed for nausea or vomiting. 01/29/21   Barbette Merino, NP    Allergies    Patient has no known allergies.  Review of Systems   Review of Systems  Constitutional:  Negative for chills and fever.  HENT:  Negative for ear pain and sore throat.   Eyes:  Negative for pain and visual disturbance.  Respiratory:  Negative for cough and shortness of breath.   Cardiovascular:  Negative for chest pain and palpitations.  Gastrointestinal:  Positive for nausea and vomiting. Negative for abdominal pain.  Genitourinary:  Negative for dysuria and hematuria.  Musculoskeletal:  Positive for back pain. Negative for arthralgias.  Skin:  Negative for color change and rash.  Neurological:  Negative for seizures and syncope.  All other systems reviewed and are negative.  Physical Exam Updated Vital Signs There were no vitals taken for this visit.  Physical Exam Vitals and nursing note reviewed.  Constitutional:      Appearance: He is well-developed. He is ill-appearing.  HENT:     Head: Normocephalic and atraumatic.  Eyes:     Conjunctiva/sclera: Conjunctivae normal.  Cardiovascular:     Rate and Rhythm: Normal rate and regular rhythm.     Heart sounds: No murmur heard. Pulmonary:     Effort: Pulmonary effort is normal. No respiratory distress.     Breath sounds: Normal breath sounds.  Abdominal:     Palpations: Abdomen is soft.     Tenderness: There is abdominal tenderness (epigastric, mild).  Musculoskeletal:        General: Tenderness (lumbar spinal, b/l paraspinal) present.     Cervical back: Neck supple.  Skin:    General: Skin is warm and dry.  Neurological:     Mental Status: He is alert.    ED Results / Procedures / Treatments    Labs (all labs ordered are listed, but only abnormal results are displayed) Labs Reviewed - No data to display  EKG None  Radiology No results found.  Procedures Procedures   Medications Ordered in ED Medications - No data to display  ED Course  I have reviewed the triage vital signs and the nursing notes.  Pertinent labs & imaging results that were available during my care of the patient were reviewed by me and considered in my medical decision making (see chart for details).    MDM Rules/Calculators/A&P  Patient seen emergency department for evaluation of back pain and presumed sickle cell crisis.  Physical exam reveals tenderness to the bilateral paraspinal lumbar muscles but is otherwise unremarkable.  Laboratory evaluation reveals leukocytosis to 12.0, hemoglobin 12.6, reticulocyte count elevated to 2 of 4.1, chemistry with hypokalemia 2.9, CO2 20.  Potassium repleted with 40 mEq of oral potassium and 800 mg magnesium oxide.  Patient was given 3 rounds of 2 mg Dilaudid every 30 minutes as well as Toradol but continued to have persistent back pain.  Sickle cell team consulted and patient will likely require admission for pain control and persistent pain in the setting of a sickle cell crisis.  Low suspicion for acute chest at this time and patient is afebrile. Final Clinical Impression(s) / ED Diagnoses Final diagnoses:  None    Rx / DC Orders ED Discharge Orders     None        Sanaz Scarlett, MD 03/05/21 1119

## 2021-03-06 ENCOUNTER — Inpatient Hospital Stay (HOSPITAL_COMMUNITY): Payer: 59

## 2021-03-06 LAB — BASIC METABOLIC PANEL
Anion gap: 10 (ref 5–15)
BUN: 11 mg/dL (ref 6–20)
CO2: 26 mmol/L (ref 22–32)
Calcium: 9.7 mg/dL (ref 8.9–10.3)
Chloride: 101 mmol/L (ref 98–111)
Creatinine, Ser: 0.84 mg/dL (ref 0.61–1.24)
GFR, Estimated: >60 mL/min (ref >60)
Glucose, Bld: 111 mg/dL — ABNORMAL HIGH (ref 70–99)
Potassium: 3.2 mmol/L — ABNORMAL LOW (ref 3.5–5.1)
Sodium: 137 mmol/L (ref 135–145)

## 2021-03-06 LAB — CBC WITH DIFFERENTIAL/PLATELET
Abs Immature Granulocytes: 0.07 10*3/uL (ref 0.00–0.07)
Basophils Absolute: 0 10*3/uL (ref 0.0–0.1)
Basophils Relative: 0 %
Eosinophils Absolute: 0.1 10*3/uL (ref 0.0–0.5)
Eosinophils Relative: 1 %
HCT: 39.7 % (ref 39.0–52.0)
Hemoglobin: 14 g/dL (ref 13.0–17.0)
Immature Granulocytes: 1 %
Lymphocytes Relative: 37 %
Lymphs Abs: 4.4 10*3/uL — ABNORMAL HIGH (ref 0.7–4.0)
MCH: 28.7 pg (ref 26.0–34.0)
MCHC: 35.3 g/dL (ref 30.0–36.0)
MCV: 81.4 fL (ref 80.0–100.0)
Monocytes Absolute: 1 10*3/uL (ref 0.1–1.0)
Monocytes Relative: 9 %
Neutro Abs: 6.3 10*3/uL (ref 1.7–7.7)
Neutrophils Relative %: 52 %
Platelets: 115 10*3/uL — ABNORMAL LOW (ref 150–400)
RBC: 4.88 MIL/uL (ref 4.22–5.81)
RDW: 14.6 % (ref 11.5–15.5)
WBC: 12 10*3/uL — ABNORMAL HIGH (ref 4.0–10.5)
nRBC: 0.6 % — ABNORMAL HIGH (ref 0.0–0.2)

## 2021-03-06 LAB — HIV ANTIBODY (ROUTINE TESTING W REFLEX): HIV Screen 4th Generation wRfx: NONREACTIVE

## 2021-03-06 MED ORDER — IOHEXOL 9 MG/ML PO SOLN
1000.0000 mL | ORAL | Status: AC
Start: 2021-03-06 — End: 2021-03-06
  Administered 2021-03-06: 500 mL via ORAL

## 2021-03-06 MED ORDER — IOHEXOL 350 MG/ML SOLN
100.0000 mL | Freq: Once | INTRAVENOUS | Status: AC | PRN
  Administered 2021-03-06: 80 mL via INTRAVENOUS

## 2021-03-06 MED ORDER — IOHEXOL 9 MG/ML PO SOLN
ORAL | Status: AC
  Administered 2021-03-06: 500 mL
  Filled 2021-03-06: qty 1000

## 2021-03-06 NOTE — Progress Notes (Signed)
Patient ID: Austin [name], male   DOB: 03-30-1991, 30 y.o.   MRN: [id number] Subjective: Austin [name]  is a 30 y.o. male with a medical history significant for sickle cell disease, chronic pain syndrome, opiate dependence and tolerance, and anemia of chronic disease who was admitted for sickle cell pain crisis.  Today patient still feel some pain but much improved from yesterday.  He still having vomiting especially in the morning.  He had vomiting this morning with some bile in vomitus.  His epigastric pain continues.  No distention.  He denies any other symptoms.  No fever.  No urinary symptoms.  Objective:  Vital signs in last 24 hours:  Vitals:   03/06/21 0448 03/06/21 0849 03/06/21 1007 03/06/21 1221  BP:   121/79   Pulse:   60   Resp: 14 14 14 11   Temp:   97.8 F (36.6 C)   TempSrc:   Oral   SpO2: 98% 98% 100% 98%  Weight:      Height:        Intake/Output from previous day:   Intake/Output Summary (Last 24 hours) at 03/06/2021 1614 Last data filed at 03/06/2021 1600 Gross per 24 hour  Intake 2997.38 ml  Output --  Net 2997.38 ml    Physical Exam: General: Alert, awake, oriented x3, in no acute distress.  HEENT: [place]/AT PEERL, EOMI Neck: Trachea midline,  no masses, no thyromegal,y no JVD, no carotid bruit OROPHARYNX:  Moist, No exudate/ erythema/lesions.  Heart: Regular rate and rhythm, without murmurs, rubs, gallops, PMI non-displaced, no heaves or thrills on palpation.  Lungs: Clear to auscultation, no wheezing or rhonchi noted. No increased vocal fremitus resonant to percussion  Abdomen: Soft, nontender, nondistended, positive bowel sounds, no masses no hepatosplenomegaly noted..  Neuro: No focal neurological deficits noted cranial nerves II through XII grossly intact. DTRs 2+ bilaterally upper and lower extremities. Strength 5 out of 5 in bilateral upper and lower extremities. Musculoskeletal: No warm swelling or erythema around joints, no spinal tenderness  noted. Psychiatric: Patient alert and oriented x3, good insight and cognition, good recent to remote recall. Lymph node survey: No cervical axillary or inguinal lymphadenopathy noted.  Lab Results:  Basic Metabolic Panel:    Component Value Date/Time   NA 137 03/06/2021 0552   NA 138 11/13/2020 1605   K 3.2 (L) 03/06/2021 0552   CL 101 03/06/2021 0552   CO2 26 03/06/2021 0552   BUN 11 03/06/2021 0552   BUN 8 11/13/2020 1605   CREATININE 0.84 03/06/2021 0552   GLUCOSE 111 (H) 03/06/2021 0552   CALCIUM 9.7 03/06/2021 0552   CBC:    Component Value Date/Time   WBC 12.0 (H) 03/06/2021 0552   HGB 14.0 03/06/2021 0552   HGB 13.6 11/13/2020 1605   HCT 39.7 03/06/2021 0552   HCT 40.5 11/13/2020 1605   PLT 115 (L) 03/06/2021 0552   PLT 177 11/13/2020 1605   MCV 81.4 03/06/2021 0552   MCV 84 11/13/2020 1605   NEUTROABS 6.3 03/06/2021 0552   NEUTROABS 5.3 11/13/2020 1605   LYMPHSABS 4.4 (H) 03/06/2021 0552   LYMPHSABS 3.8 (H) 11/13/2020 1605   MONOABS 1.0 03/06/2021 0552   EOSABS 0.1 03/06/2021 0552   EOSABS 0.2 11/13/2020 1605   BASOSABS 0.0 03/06/2021 0552   BASOSABS 0.0 11/13/2020 1605    No results found for this or any previous visit (from the past 240 hour(s)).  Studies/Results: No results found.  Medications: Scheduled Meds:  enoxaparin (LOVENOX) injection  40 mg Subcutaneous Q24H   HYDROmorphone   Intravenous Q4H   ketorolac  15 mg Intravenous Q6H   senna-docusate  1 tablet Oral BID   Continuous Infusions:  sodium chloride 125 mL/hr at 03/06/21 1600   diphenhydrAMINE     PRN Meds:.diphenhydrAMINE **OR** diphenhydrAMINE, naloxone **AND** sodium chloride flush, ondansetron (ZOFRAN) IV, oxyCODONE, polyethylene glycol  Consultants: None  Procedures: None  Antibiotics: None  Assessment/Plan: Principal Problem:   Sickle cell anemia with crisis (HCC) Active Problems:   Chronic pain syndrome   Hypokalemia   Anemia of chronic disease  Hb Sickle Cell  Disease with crisis: Continue IVF D5 .45% Saline @ 125 mls/hour in view of ongoing vomiting, continue weight based Dilaudid PCA at current dose setting, continue IV Toradol 15 mg Q 6 H for total of 5 days, continue home medications as tolerated.  Monitor vitals very closely, Re-evaluate pain scale regularly, 2 L of Oxygen by [place]. Intractable nausea and vomiting: We will order CT abdomen.  Patient may need upper endoscopic evaluation.   Leukocytosis: Most likely demargination from sickle cell pain crisis.  We will continue to observe without antibiotics. Sickle Cell Anemia: Hemoglobin continues to be stable at baseline.  No clinical indication for blood transfusion today. Chronic pain Syndrome: Continue home medications. Hypokalemia: Corrected.  Code Status: Full Code Family Communication: N/A Disposition Plan: Not yet ready for discharge  Austin [name]  If 7PM-7AM, please contact night-coverage.  03/06/2021, 4:14 PM  LOS: 1 day

## 2021-03-07 DIAGNOSIS — D5700 Hb-SS disease with crisis, unspecified: Principal | ICD-10-CM

## 2021-03-07 LAB — CBC WITH DIFFERENTIAL/PLATELET
Abs Immature Granulocytes: 0.03 10*3/uL (ref 0.00–0.07)
Basophils Absolute: 0 10*3/uL (ref 0.0–0.1)
Basophils Relative: 0 %
Eosinophils Absolute: 0.1 10*3/uL (ref 0.0–0.5)
Eosinophils Relative: 1 %
HCT: 31.4 % — ABNORMAL LOW (ref 39.0–52.0)
Hemoglobin: 11.1 g/dL — ABNORMAL LOW (ref 13.0–17.0)
Immature Granulocytes: 0 %
Lymphocytes Relative: 30 %
Lymphs Abs: 2.6 10*3/uL (ref 0.7–4.0)
MCH: 28.8 pg (ref 26.0–34.0)
MCHC: 35.4 g/dL (ref 30.0–36.0)
MCV: 81.3 fL (ref 80.0–100.0)
Monocytes Absolute: 1 10*3/uL (ref 0.1–1.0)
Monocytes Relative: 11 %
Neutro Abs: 5.1 10*3/uL (ref 1.7–7.7)
Neutrophils Relative %: 58 %
Platelets: 105 10*3/uL — ABNORMAL LOW (ref 150–400)
RBC: 3.86 MIL/uL — ABNORMAL LOW (ref 4.22–5.81)
RDW: 14.6 % (ref 11.5–15.5)
WBC: 8.8 10*3/uL (ref 4.0–10.5)
nRBC: 0.3 % — ABNORMAL HIGH (ref 0.0–0.2)

## 2021-03-07 LAB — COMPREHENSIVE METABOLIC PANEL
ALT: 49 U/L — ABNORMAL HIGH (ref 0–44)
AST: 16 U/L (ref 15–41)
Albumin: 4.2 g/dL (ref 3.5–5.0)
Alkaline Phosphatase: 41 U/L (ref 38–126)
Anion gap: 8 (ref 5–15)
BUN: 7 mg/dL (ref 6–20)
CO2: 26 mmol/L (ref 22–32)
Calcium: 8.9 mg/dL (ref 8.9–10.3)
Chloride: 101 mmol/L (ref 98–111)
Creatinine, Ser: 0.7 mg/dL (ref 0.61–1.24)
GFR, Estimated: >60 mL/min (ref >60)
Glucose, Bld: 97 mg/dL (ref 70–99)
Potassium: 3.5 mmol/L (ref 3.5–5.1)
Sodium: 135 mmol/L (ref 135–145)
Total Bilirubin: 3 mg/dL — ABNORMAL HIGH (ref 0.3–1.2)
Total Protein: 7.5 g/dL (ref 6.5–8.1)

## 2021-03-07 NOTE — Discharge Summary (Signed)
Physician Discharge Summary  Patient ID: Austin [name] MRN: [id number] DOB/AGE: 25-Jul-1991 30 y.o.  Admit date: 03/05/2021 Discharge date: 03/07/2021  Admission Diagnoses:  Discharge Diagnoses:  Principal Problem:   Sickle cell anemia with crisis [facility]) Active Problems:   Chronic pain syndrome   Hypokalemia   Anemia of chronic disease   Discharged Condition: good  Hospital Course: Patient is 30 year old with history of sickle cell disease presented with nausea vomiting abdominal pain and sickle cell painful crisis.  He was admitted and started on Dilaudid PCA.  Work-up for his nausea vomiting abdominal pain was performed including CT abdomen pelvis.  All of his Back within normal.  He is currently 6 stable and tolerated diet.  Pain is much better controlled and back to baseline.  Patient therefore being discharged home.  Looks like he may have some acute viral illness with some acute viral gastroenteritis.  Consults: None  Significant Diagnostic Studies: labs: CBC and CMP is checked his seem to be at baseline.  And radiology: CT scan: Abdomen and pelvis no acute findings  Treatments: IV hydration and analgesia: acetaminophen and Dilaudid  Discharge Exam: Blood pressure 131/81, pulse 80, temperature 98.2 F (36.8 C), temperature source Oral, resp. rate 14, height 5\' 7"  (1.702 m), weight 88.1 kg, SpO2 100 %. General appearance: alert, cooperative, appears stated age, and no distress Head: Normocephalic, without obvious abnormality, atraumatic Resp: clear to auscultation bilaterally Cardio: regular rate and rhythm, S1, S2 normal, no murmur, click, rub or gallop GI: soft, non-tender; bowel sounds normal; no masses,  no organomegaly Extremities: extremities normal, atraumatic, no cyanosis or edema Pulses: 2+ and symmetric Skin: Skin color, texture, turgor normal. No rashes or lesions  Disposition:    Allergies as of 03/07/2021   No Known Allergies      Medication List      STOP taking these medications    ibuprofen 800 MG tablet Commonly known as: ADVIL       TAKE these medications    dicyclomine 20 MG tablet Commonly known as: BENTYL Take 1 tablet (20 mg total) by mouth 2 (two) times daily.   lidocaine 5 % Commonly known as: Lidoderm Place 1 patch onto the skin daily. Remove & Discard patch within 12 hours or as directed by MD   loratadine 10 MG tablet Commonly known as: CLARITIN Take 1 tablet (10 mg total) by mouth daily. What changed:  when to take this reasons to take this   Olopatadine HCl 0.2 % Soln Apply 1 drop to eye in the morning and at bedtime. What changed:  when to take this reasons to take this   ondansetron 4 MG disintegrating tablet Commonly known as: Zofran ODT Take 1 tablet (4 mg total) by mouth every 8 (eight) hours as needed for nausea or vomiting.   Oxycodone HCl 10 MG Tabs Take 1 tablet (10 mg total) by mouth 4 (four) times daily as needed for up to 15 days.   promethazine 25 MG tablet Commonly known as: PHENERGAN Take 1 tablet (25 mg total) by mouth every 6 (six) hours as needed for nausea or vomiting.         Signed8/15/2022 03/07/2021, 1:21 PM

## 2021-03-07 NOTE — Progress Notes (Signed)
Patient discharged to home with family, discharge instructions reviewed with patient who verbalized understanding. 

## 2021-03-09 ENCOUNTER — Ambulatory Visit: Payer: 59 | Admitting: Physical Therapy

## 2021-03-09 ENCOUNTER — Other Ambulatory Visit: Payer: Self-pay | Admitting: *Deleted

## 2021-03-09 ENCOUNTER — Other Ambulatory Visit: Payer: Self-pay

## 2021-03-09 ENCOUNTER — Encounter: Payer: Self-pay | Admitting: Physical Therapy

## 2021-03-09 DIAGNOSIS — R2689 Other abnormalities of gait and mobility: Secondary | ICD-10-CM

## 2021-03-09 DIAGNOSIS — M6281 Muscle weakness (generalized): Secondary | ICD-10-CM

## 2021-03-09 DIAGNOSIS — M25672 Stiffness of left ankle, not elsewhere classified: Secondary | ICD-10-CM

## 2021-03-09 DIAGNOSIS — M25572 Pain in left ankle and joints of left foot: Secondary | ICD-10-CM | POA: Diagnosis not present

## 2021-03-09 NOTE — Therapy (Signed)
[facility] [address] [place], [place], [address] Phone: [phone]   Fax:  [phone]  Physical Therapy Treatment / ERO  Patient Details  Name: Austin [name] MRN: [id number] Date of Birth: 1990-11-01 Referring Provider (PT): Thad Ranger, NP   Encounter Date: 03/09/2021   PT End of Session - 03/09/21 1059     Visit Number 9    Number of Visits 13    Date for PT Re-Evaluation 03/30/21    Authorization Type [facility] UMR    PT Start Time 1053    PT Stop Time 1132    PT Time Calculation (min) 39 min    Activity Tolerance Patient tolerated treatment well    Behavior During Therapy [facility] for tasks assessed/performed             Past Medical History:  Diagnosis Date   Acute maxillary sinusitis    Eczema    Sickle cell anemia (HCC) .    Past Surgical History:  Procedure Laterality Date   FOOT SURGERY Left 12/25/2020   WISDOM TOOTH EXTRACTION      There were no vitals filed for this visit.   Subjective Assessment - 03/09/21 1055     Subjective Patient reports he was in the hospital for 3 days due to sickle cell pain crisis. He does report that the few days off of his feet really helped his foot pain and is feeling a lot more comfortable. He reports exercises are going well.    Patient Stated Goals Get back to walking without pain.    Currently in Pain? No/denies                [facility] PT Assessment - 03/09/21 0001       Assessment   Medical Diagnosis S/P plantar fasciotomy    Referring Provider (PT) Thad Ranger, NP    Onset Date/Surgical Date 12/25/20      Precautions   Precautions None      Restrictions   Weight Bearing Restrictions No      Balance Screen   Has the patient fallen in the past 6 months No      Prior Function   Level of Independence Independent      Observation/Other Assessments   Focus on Therapeutic Outcomes (FOTO)  67% functional status      AROM   Right Ankle Dorsiflexion  8    Left Ankle Dorsiflexion 8      Strength   Left Ankle Dorsiflexion 5/5    Left Ankle Plantar Flexion 4/5    Left Ankle Inversion 4/5    Left Ankle Eversion 4/5      Palpation   Palpation comment Non-TTP      Transfers   Transfers Independent with all Transfers      Ambulation/Gait   Gait Pattern Within Functional Limits                           [facility] Adult PT Treatment/Exercise - 03/09/21 0001       Neuro Re-ed    Neuro Re-ed Details  SL clock reach cone taps 3 x 5 each      Exercises   Exercises Ankle      Ankle Exercises: Stretches   Gastroc Stretch 2 reps;30 seconds    Gastroc Stretch Limitations runner stretch      Ankle Exercises: Aerobic   Elliptical L1 R1 x 3 min    Recumbent  Bike L3 x 3 min while taking subjective      Ankle Exercises: Standing   Rebounder SL stance on Airex 2 x 15 each    Heel Raises 15 reps   3 sets   Heel Raises Limitations single leg bilat    Other Standing Ankle Exercises Standing SL squat/kick/reach 2 x 10 each    Other Standing Ankle Exercises Lateral band walk with green around ankles 2 x 30      Ankle Exercises: Supine   T-Band Eversion/Inversion with green 2 x 20 each                    PT Education - 03/09/21 1059     Education Details POC, FOTO, HEP    Person(s) Educated Patient    Methods Explanation;Demonstration;Verbal cues    Comprehension Verbalized understanding;Returned demonstration;Verbal cues required;Need further instruction              PT Short Term Goals - 02/12/21 1631       PT SHORT TERM GOAL #2   Title Patient will fully weight bear on L LE without CAM boot to begin gait training with no more than 5/10 pain.    Baseline WB only in CAM boot for <10 feet with pain >8/10.    Status Achieved      PT SHORT TERM GOAL #3   Title Patient will improve L DF to neutral to allow gait training to progress toward nomalized pattern.    Baseline L DF=-15deg    Status Achieved                PT Long Term Goals - 03/09/21 1131       PT LONG TERM GOAL #1   Title Patient will be independent with final HEP and progression to continue to reduce pain and symptoms after discharge.    Baseline progressing with final HEP    Time 3    Period Weeks    Status On-going    Target Date 03/30/21      PT LONG TERM GOAL #2   Title FOTO score will increase to 62% as predicted for improved perception of functional abilities.    Baseline 67% functional status    Time --    Period --    Status Achieved      PT LONG TERM GOAL #3   Title Patient will report walking >20 min with no more than 2/10 pain without assistive device to demonstrate short community mobility.    Baseline Patient reports increase in pain with extended periods of walking    Time 3    Period Weeks    Status On-going    Target Date 03/30/21      PT LONG TERM GOAL #4   Title Patient will improve L DF to >10deg to allow up/down steps to enter with reciprocal pattern per home environment.    Baseline 8 deg ankle DF    Time 3    Period Weeks    Status On-going    Target Date 03/30/21                   Plan - 03/09/21 1105     Clinical Impression Statement Patient tolerated therapy well with no adverse effects. He reports great improvement since last visit and is progressing well towards established goals. He has achieved his FOTO goal but does continue to exhibit calf tightness and weakness. He is progressing well with his strengthening  exercises and only reported slight increase in soreness post therapy. Patient would benefit from continued skilled PT to progress his ankle mobility and strength in order to reduce pain and maximize functional ability.    PT Frequency 2x / week    PT Duration 3 weeks    PT Treatment/Interventions ADLs/Self Care Home Management;Cryotherapy;Moist Heat;DME Instruction;Gait training;Stair training;Functional mobility training;Therapeutic activities;Therapeutic  exercise;Balance training;Neuromuscular re-education;Patient/family education;Manual techniques;Scar mobilization;Passive range of motion;Taping;Vasopneumatic Device    PT Next Visit Plan Review HEP and progress PRN, progress ankle mobility and strength, balance training    PT Home Exercise Plan [email or web address]    Consulted and Agree with Plan of Care Patient             Patient will benefit from skilled therapeutic intervention in order to improve the following deficits and impairments:  Abnormal gait, Decreased balance, Decreased endurance, Decreased mobility, Difficulty walking, Impaired sensation, Increased edema, Decreased activity tolerance, Decreased strength, Hypermobility, Impaired flexibility, Pain  Visit Diagnosis: Pain in left ankle and joints of left foot  Stiffness of left ankle, not elsewhere classified  Muscle weakness (generalized)  Other abnormalities of gait and mobility     Problem List Patient Active Problem List   Diagnosis Date Noted   Hypokalemia 03/05/2021   Anemia of chronic disease 03/05/2021   Vitamin D deficiency 11/16/2020   Bilateral lower extremity pain 10/17/2019   Sickle cell anemia without crisis (HCC) 09/20/2019   Chronic pain syndrome 09/20/2019   Nausea 09/20/2019   Chronic, continuous use of opioids 09/20/2019   Medication management 03/20/2019   Bilious vomiting without nausea    Viral gastroenteritis 01/23/2019   Nausea vomiting and diarrhea    Generalized abdominal pain    Hb-SS disease with crisis (HCC)    Dehydration    Sickle cell pain crisis (HCC) 05/18/2018   Acute sickle cell crisis (HCC) 12/15/2017   Thrombocytopenia (HCC) 12/15/2017   Splenomegaly 12/15/2017   Sickle cell anemia with crisis (HCC) 12/15/2017   Spleen enlarged 08/18/2017    Rosana Hoes, PT, DPT, LAT, ATC 03/09/21  11:43 AM Phone: [phone] Fax: [phone]   [facility] Out[facility] Rehabilitation [facility] [address] [place], [place], [address] Phone: [phone]   Fax:  [phone]  Name: Austin [name] MRN: [id number] Date of Birth: July 26, 1991

## 2021-03-09 NOTE — Patient Outreach (Signed)
[facility]) Care Management  03/09/2021  Austin [name] 02/28/1991 [id number]  Transition of care telephone call  Referral received:03/06/21 Initial outreach:03/09/21 Insurance: [facility]   Initial unsuccessful telephone call to patient's preferred number in order to complete transition of care assessment; no answer, received message called party temporarily unavailable.   Objective: Per the electronic medical record, [name] was hospitalized at [facility]  8/11-8/13 for Sickle Cell Pain Crisis,Abdominal pain nausea and vomiting . Comorbidities include: Sickle Cell anemia , Left foot surgery by Podiatry, Endoscopic plantar fasciotomy 12/25/20  He was discharged to home on 02/1321 without the need for home health services or DME  Return call from patient  Subjective: Initial successful telephone call to patient's preferred number in order to complete transition of care assessment; 2 HIPAA identifiers verified. Explained purpose of call and completed transition of care assessment.  States she/he is doing well,feeling great best he has felt in a while. He expressed appreciation of great care he received during hospital stay. He reports pain controlled with prn medication tolerating diet, staying hydrated no vomiting this am. He denies bowel or bladder problems, he discussed Spouse is assisting in recovery. He reports having a good physical therapy session on today reports .   Reviewed accessing the following [place] Benefits : He does not have the hospital indemnity plan, he is discussed still being on FMLA until September  after recent foot surgery . He does not use a [facility] outpatient pharmacy.        Assessment:  Patient voices good understanding of all discharge instructions.  See transition of care flowsheet for assessment details.   Plan:  Reviewed hospital discharge diagnosis of Sickle Cell pain crisis   and discharge treatment  plan using hospital discharge instructions, assessing medication adherence, reviewing problems requiring provider notification, and discussing the importance of follow up with surgeon, primary care provider and/or specialists as directed.  Reviewed  healthy lifestyle program information to receive discounted premium for  2023   Step 1: Get  your annual physical  Step 2: Complete your health assessment  Step 3:Identify your current health status and complete the corresponding action step between July 26, 2020 and March 26, 2021.       No ongoing care management needs identified so will close case to [facility] Care Management services and route successful outreach letter with [facility] Care Management pamphlet and 24 Hour Nurse Line Magnet to [employer] Care Management clinical pool to be mailed to patient's home address.  Thanked patient for their services to [facility].   Austin Garibaldi, RN, BSN  [facility] Care Management,Care Management Coordinator  [phone]- Mobile [phone]- Toll Free Main Office

## 2021-03-11 ENCOUNTER — Ambulatory Visit (INDEPENDENT_AMBULATORY_CARE_PROVIDER_SITE_OTHER): Payer: 59 | Admitting: Podiatry

## 2021-03-11 ENCOUNTER — Other Ambulatory Visit: Payer: Self-pay

## 2021-03-11 ENCOUNTER — Encounter: Payer: Self-pay | Admitting: Podiatry

## 2021-03-11 DIAGNOSIS — Z9889 Other specified postprocedural states: Secondary | ICD-10-CM

## 2021-03-11 NOTE — Progress Notes (Signed)
   Subjective:  Patient presents today status post EPF left. DOS: 09/14/2020.  Patient states that he is doing very well.  Overall there is significant improvement.  He is very happy with the surgery so far.  He is ambulating in good supportive sneakers.  Past Medical History:  Diagnosis Date   Acute maxillary sinusitis    Eczema    Sickle cell anemia (HCC) .      Objective/Physical Exam Neurovascular status intact.  Skin incisions appear to be well coapted and healed.   Negative for any significant tenderness to palpation along the plantar heel.   Assessment: 1. s/p EPF LT.  DOS: 12/25/2020   Plan of Care:  1. Patient was evaluated. 2.  Patient states that he is doing very well.  He has completed his physical therapy.  He is ready to return to work with restrictions. 3.  Note for work was provided today.  Patient may return to work beginning 03/26/2021 half shift only x1 month.  Beginning 04/25/2021 he may return to work full activity no restrictions 4.  Return to clinic as needed  Felecia Shelling, DPM [facility]  Dr. Felecia Shelling, DPM    [address] [place], [place] [address]                Office [phone]  Fax [phone]

## 2021-03-12 ENCOUNTER — Ambulatory Visit: Payer: 59 | Admitting: Physical Therapy

## 2021-03-16 ENCOUNTER — Other Ambulatory Visit: Payer: Self-pay

## 2021-03-16 ENCOUNTER — Encounter: Payer: Self-pay | Admitting: Physical Therapy

## 2021-03-16 ENCOUNTER — Ambulatory Visit: Payer: 59 | Admitting: Physical Therapy

## 2021-03-16 DIAGNOSIS — M6281 Muscle weakness (generalized): Secondary | ICD-10-CM | POA: Diagnosis not present

## 2021-03-16 DIAGNOSIS — G894 Chronic pain syndrome: Secondary | ICD-10-CM

## 2021-03-16 DIAGNOSIS — M25572 Pain in left ankle and joints of left foot: Secondary | ICD-10-CM | POA: Diagnosis not present

## 2021-03-16 DIAGNOSIS — F1190 Opioid use, unspecified, uncomplicated: Secondary | ICD-10-CM

## 2021-03-16 DIAGNOSIS — M25672 Stiffness of left ankle, not elsewhere classified: Secondary | ICD-10-CM | POA: Diagnosis not present

## 2021-03-16 DIAGNOSIS — R2689 Other abnormalities of gait and mobility: Secondary | ICD-10-CM | POA: Diagnosis not present

## 2021-03-16 NOTE — Therapy (Signed)
[facility] [address] [place], [place], [address] Phone: [phone]   Fax:  [phone]  Physical Therapy Treatment  Patient Details  Name: BARTHOLOMEW RAMESH MRN: [id number] Date of Birth: [birth year] Referring Provider (PT): Thad Ranger, NP   Encounter Date: 03/16/2021   PT End of Session - 03/16/21 1055     Visit Number 10    Number of Visits 13    Date for PT Re-Evaluation 03/30/21    Authorization Type [facility] UMR    PT Start Time 1050    PT Stop Time 1130    PT Time Calculation (min) 40 min    Activity Tolerance Patient tolerated treatment well    Behavior During Therapy [facility] for tasks assessed/performed             Past Medical History:  Diagnosis Date   Acute maxillary sinusitis    Eczema    Sickle cell anemia (HCC) .    Past Surgical History:  Procedure Laterality Date   FOOT SURGERY Left 12/25/2020   WISDOM TOOTH EXTRACTION      There were no vitals filed for this visit.   Subjective Assessment - 03/16/21 1054     Subjective Patient reports he is feeling tired today, his foot is feeling ok. He has been starting to workout at home again and states exercies are still going well.    Patient Stated Goals Get back to walking without pain.    Currently in Pain? Yes    Pain Score 2     Pain Location Foot    Pain Orientation Left    Pain Descriptors / Indicators Sore    Pain Type Chronic pain    Pain Onset More than a month ago    Pain Frequency Intermittent                [facility] PT Assessment - 03/16/21 0001       Strength   Left Ankle Plantar Flexion 4/5                           [facility] Adult PT Treatment/Exercise - 03/16/21 0001       Exercises   Exercises Ankle      Ankle Exercises: Stretches   Slant Board Stretch 3 reps;30 seconds      Ankle Exercises: Aerobic   Recumbent Bike L3 x 4 min while taking subjective      Ankle Exercises: Machines for Strengthening    Cybex Leg Press 85# x 10, 105# 3 x 10      Ankle Exercises: Standing   Rebounder SL stance on Airex 3 x 15 each    Heel Raises 20 reps   2 sets   Heel Raises Limitations edge of step    Other Standing Ankle Exercises SL RDL with 15# 2 x 10 each    Other Standing Ankle Exercises SL heel raises 2 x 15 each      Ankle Exercises: Seated   Heel Raises 20 reps   2 sets   Heel Raises Limitations 30#      Ankle Exercises: Supine   T-Band Eversion/Inversion with green 2 x 20 each   left only                   PT Education - 03/16/21 1055     Education Details HEP    Person(s) Educated Patient    Methods Explanation;Demonstration;Verbal cues  Comprehension Verbalized understanding;Returned demonstration;Verbal cues required;Need further instruction              PT Short Term Goals - 02/12/21 1631       PT SHORT TERM GOAL #2   Title Patient will fully weight bear on L LE without CAM boot to begin gait training with no more than 5/10 pain.    Baseline WB only in CAM boot for <10 feet with pain >8/10.    Status Achieved      PT SHORT TERM GOAL #3   Title Patient will improve L DF to neutral to allow gait training to progress toward nomalized pattern.    Baseline L DF=-15deg    Status Achieved               PT Long Term Goals - 03/09/21 1131       PT LONG TERM GOAL #1   Title Patient will be independent with final HEP and progression to continue to reduce pain and symptoms after discharge.    Baseline progressing with final HEP    Time 3    Period Weeks    Status On-going    Target Date 03/30/21      PT LONG TERM GOAL #2   Title FOTO score will increase to 62% as predicted for improved perception of functional abilities.    Baseline 67% functional status    Time --    Period --    Status Achieved      PT LONG TERM GOAL #3   Title Patient will report walking >20 min with no more than 2/10 pain without assistive device to demonstrate short community  mobility.    Baseline Patient reports increase in pain with extended periods of walking    Time 3    Period Weeks    Status On-going    Target Date 03/30/21      PT LONG TERM GOAL #4   Title Patient will improve L DF to >10deg to allow up/down steps to enter with reciprocal pattern per home environment.    Baseline 8 deg ankle DF    Time 3    Period Weeks    Status On-going    Target Date 03/30/21                   Plan - 03/16/21 1056     Clinical Impression Statement Patient tolerated therapy well with no adverse effects. Therapy continued to focus on progress calf flexibility, ankle strength and control. He seems to be progressing well with his single leg balance on unstable surface with ball toss and did not report any increase in pain this visit. He does continue to demonstrate strength deficit of left ankle compared to the right. Patient would benefit from continued skilled PT to progress his ankle mobility and strength in order to reduce pain and maximize functional ability.    PT Treatment/Interventions ADLs/Self Care Home Management;Cryotherapy;Moist Heat;DME Instruction;Gait training;Stair training;Functional mobility training;Therapeutic activities;Therapeutic exercise;Balance training;Neuromuscular re-education;Patient/family education;Manual techniques;Scar mobilization;Passive range of motion;Taping;Vasopneumatic Device    PT Next Visit Plan Review HEP and progress PRN, progress ankle mobility and strength, balance training    PT Home Exercise Plan [email or web address]    Consulted and Agree with Plan of Care Patient             Patient will benefit from skilled therapeutic intervention in order to improve the following deficits and impairments:  Abnormal gait, Decreased balance, Decreased endurance, Decreased mobility, Difficulty walking,  Impaired sensation, Increased edema, Decreased activity tolerance, Decreased strength, Hypermobility, Impaired flexibility, Pain  Visit  Diagnosis: Pain in left ankle and joints of left foot  Stiffness of left ankle, not elsewhere classified  Muscle weakness (generalized)  Other abnormalities of gait and mobility     Problem List Patient Active Problem List   Diagnosis Date Noted   Hypokalemia 03/05/2021   Anemia of chronic disease 03/05/2021   Vitamin D deficiency 11/16/2020   Bilateral lower extremity pain 10/17/2019   Sickle cell anemia without crisis (HCC) 09/20/2019   Chronic pain syndrome 09/20/2019   Nausea 09/20/2019   Chronic, continuous use of opioids 09/20/2019   Medication management 03/20/2019   Bilious vomiting without nausea    Viral gastroenteritis 01/23/2019   Nausea vomiting and diarrhea    Generalized abdominal pain    Hb-SS disease with crisis (HCC)    Dehydration    Sickle cell pain crisis (HCC) 05/18/2018   Acute sickle cell crisis (HCC) 12/15/2017   Thrombocytopenia (HCC) 12/15/2017   Splenomegaly 12/15/2017   Sickle cell anemia with crisis (HCC) 12/15/2017   Spleen enlarged 08/18/2017    Rosana Hoes, PT, DPT, LAT, ATC 03/16/21  11:47 AM Phone: [phone] Fax: [phone]   [facility] Outpatient Rehabilitation [facility] [address] [place], [place], [address] Phone: [phone]   Fax:  [phone]  Name: MENELIK MCFARREN MRN: [id number] Date of Birth: 04/16/1991

## 2021-03-17 ENCOUNTER — Telehealth: Payer: Self-pay

## 2021-03-17 ENCOUNTER — Other Ambulatory Visit: Payer: Self-pay

## 2021-03-17 DIAGNOSIS — F1190 Opioid use, unspecified, uncomplicated: Secondary | ICD-10-CM

## 2021-03-17 DIAGNOSIS — G894 Chronic pain syndrome: Secondary | ICD-10-CM

## 2021-03-17 MED ORDER — DICYCLOMINE HCL 20 MG PO TABS: 20.0000 mg | ORAL_TABLET | Freq: Two times a day (BID) | ORAL | 0 refills | Status: DC

## 2021-03-17 NOTE — Telephone Encounter (Signed)
Percocet Dicyclomine

## 2021-03-18 MED ORDER — OXYCODONE HCL 10 MG PO TABS: 10.0000 mg | ORAL_TABLET | Freq: Four times a day (QID) | ORAL | 0 refills | Status: DC | PRN

## 2021-03-19 ENCOUNTER — Ambulatory Visit: Payer: 59 | Admitting: Physical Therapy

## 2021-03-19 ENCOUNTER — Encounter: Payer: Self-pay | Admitting: Physical Therapy

## 2021-03-19 ENCOUNTER — Other Ambulatory Visit: Payer: Self-pay

## 2021-03-19 DIAGNOSIS — R2689 Other abnormalities of gait and mobility: Secondary | ICD-10-CM | POA: Diagnosis not present

## 2021-03-19 DIAGNOSIS — M6281 Muscle weakness (generalized): Secondary | ICD-10-CM | POA: Diagnosis not present

## 2021-03-19 DIAGNOSIS — M25572 Pain in left ankle and joints of left foot: Secondary | ICD-10-CM | POA: Diagnosis not present

## 2021-03-19 DIAGNOSIS — M25672 Stiffness of left ankle, not elsewhere classified: Secondary | ICD-10-CM | POA: Diagnosis not present

## 2021-03-19 NOTE — Therapy (Signed)
[place], [place], [address] Phone: [phone]   Fax:  [phone]  Physical Therapy Treatment / Discharge  Patient Details  Name: Austin [name] MRN: [id number] Date of Birth: 10-21-1990 Referring Provider (PT): Dionisio David, NP   Encounter Date: 03/19/2021   PT End of Session - 03/19/21 1546     Visit Number 11    Number of Visits 13    Date for PT Re-Evaluation 03/30/21    Authorization Type [facility] U[facility]    PT Start Time 1536    PT Stop Time 2811    PT Time Calculation (min) 38 min    Activity Tolerance Patient tolerated treatment well    Behavior During Therapy [facility] for tasks assessed/performed             Past Medical History:  Diagnosis Date   Acute maxillary sinusitis    Eczema    Sickle cell anemia ([place]) .    Past Surgical History:  Procedure Laterality Date   FOOT SURGERY Left 12/25/2020   WISDOM TOOTH EXTRACTION      There were no vitals filed for this visit.   Subjective Assessment - 03/19/21 1542     Subjective Patient reports he is doing well with no new issues. He is feeling better and states he returns to work on 03/26/21. He is independent with his HEP and feels he is ready for discharge from therapy.    How long can you stand comfortably? 3-4 hours    Patient Stated Goals Get back to walking without pain.    Currently in Pain? No/denies                [facility] PT Assessment - 03/19/21 0001       Assessment   Medical Diagnosis S/P plantar fasciotomy    Referring Provider (PT) Dionisio David, NP    Onset Date/Surgical Date 12/25/20      Precautions   Precautions None      Restrictions   Weight Bearing Restrictions No      Balance Screen   Has the patient fallen in the past 6 months No      Prior Function   Level of Independence Independent      Observation/Other Assessments   Focus on Therapeutic Outcomes (FOTO)  67% functional status   assessed 03/09/2021      AROM   Right Ankle Dorsiflexion 10    Left Ankle Dorsiflexion 10      Strength   Left Ankle Dorsiflexion 5/5    Left Ankle Plantar Flexion 5/5    Left Ankle Inversion 5/5    Left Ankle Eversion 5/5      Ambulation/Gait   Gait Pattern Within Functional Limits                           [facility] Adult PT Treatment/Exercise - 03/19/21 0001       Self-Care   Self-Care Other Self-Care Comments    Other Self-Care Comments  POC discharge, exam findings and meeting goals, HEP      Exercises   Exercises Ankle      Ankle Exercises: Aerobic   Recumbent Bike L3 x 3 min while taking subjective      Ankle Exercises: Machines for Strengthening   Cybex Leg Press 85# x 10, 105# 3 x 10      Ankle Exercises: Standing   Rebounder SL stance on Airex 3  x 15 each    Heel Raises 20 reps   2 sets   Heel Raises Limitations edge of step    Side Shuffle (Round Trip) Lateral band walk with green around ankles 2 x 30    Other Standing Ankle Exercises Standing SL squat/kick/reach 2 x 10 each    Other Standing Ankle Exercises SL heel raises 2 x 15 each      Ankle Exercises: Supine   T-Band Eversion/Inversion with green 2 x 20 each                    PT Education - 03/19/21 1546     Education Details POC discharge, HEP    Person(s) Educated Patient    Methods Explanation    Comprehension Verbalized understanding              PT Short Term Goals - 02/12/21 1631       PT SHORT TERM GOAL #2   Title Patient will fully weight bear on L LE without CAM boot to begin gait training with no more than 5/10 pain.    Baseline WB only in CAM boot for <10 feet with pain >8/10.    Status Achieved      PT SHORT TERM GOAL #3   Title Patient will improve L DF to neutral to allow gait training to progress toward nomalized pattern.    Baseline L DF=-15deg    Status Achieved               PT Long Term Goals - 03/19/21 1549       PT LONG TERM GOAL #1   Title  Patient will be independent with final HEP and progression to continue to reduce pain and symptoms after discharge.    Baseline patient I with final HEP    Time 3    Period Weeks    Status Achieved      PT LONG TERM GOAL #2   Title FOTO score will increase to 62% as predicted for improved perception of functional abilities.    Baseline 67% functional status    Status Achieved      PT LONG TERM GOAL #3   Title Patient will report walking >20 min with no more than 2/10 pain without assistive device to demonstrate short community mobility.    Baseline Patient reports ability to stand walk 3-4 hours without increase in pain    Time 3    Period Weeks    Status Achieved      PT LONG TERM GOAL #4   Title Patient will improve L DF to >10deg to allow up/down steps to enter with reciprocal pattern per home environment.    Baseline 10 deg ankle DF    Time 3    Period Weeks    Status Achieved                   Plan - 03/19/21 1546     Clinical Impression Statement Patient tolerated therapy well with no adverse effects. He has achieved all established LTGs and demonstrates improved motion, strength, balance, and overall function. He is independent with current HEP program and progressing strengthening at home. He will be formally discharged from PT as therapy is no longer indicated and patient has achieved all goals and is pleased with his current functional level.    PT Treatment/Interventions ADLs/Self Care Home Management;Cryotherapy;Moist Heat;DME Instruction;Gait training;Stair training;Functional mobility training;Therapeutic activities;Therapeutic exercise;Balance training;Neuromuscular re-education;Patient/family education;Manual techniques;Scar mobilization;Passive range  of motion;Taping;Vasopneumatic Device    PT Next Visit Plan NA - discharge    PT Home Exercise Plan [email or web address]    Consulted and Agree with Plan of Care Patient             Patient will benefit from skilled  therapeutic intervention in order to improve the following deficits and impairments:  Abnormal gait, Decreased balance, Decreased endurance, Decreased mobility, Difficulty walking, Impaired sensation, Increased edema, Decreased activity tolerance, Decreased strength, Hypermobility, Impaired flexibility, Pain  Visit Diagnosis: Pain in left ankle and joints of left foot  Stiffness of left ankle, not elsewhere classified  Muscle weakness (generalized)  Other abnormalities of gait and mobility     Problem List Patient Active Problem List   Diagnosis Date Noted   Hypokalemia 03/05/2021   Anemia of chronic disease 03/05/2021   Vitamin D deficiency 11/16/2020   Bilateral lower extremity pain 10/17/2019   Sickle cell anemia without crisis ([place]) 09/20/2019   Chronic pain syndrome 09/20/2019   Nausea 09/20/2019   Chronic, continuous use of opioids 09/20/2019   Medication management 03/20/2019   Bilious vomiting without nausea    Viral gastroenteritis 01/23/2019   Nausea vomiting and diarrhea    Generalized abdominal pain    Hb-SS disease with crisis ([place])    Dehydration    Sickle cell pain crisis ([place]) 05/18/2018   Acute sickle cell crisis ([place]) 12/15/2017   Thrombocytopenia ([place]) 12/15/2017   Splenomegaly 12/15/2017   Sickle cell anemia with crisis ([place]) 12/15/2017   Spleen enlarged 08/18/2017    Hilda Blades, PT, DPT, LAT, ATC 03/19/21  4:18 PM Phone: [phone] Fax: [place], [place], [address] Phone: [phone]   Fax:  [phone]  Name: Austin [name] MRN: [id number] Date of Birth: 01/03/1991   PHYSICAL THERAPY DISCHARGE SUMMARY  Visits from Start of Care: 11  Current functional level related to goals / functional outcomes: See above   Remaining deficits: See above   Education / Equipment: HEP   Patient agrees to discharge. Patient goals were met. Patient  is being discharged due to meeting the stated rehab goals.

## 2021-03-19 NOTE — Patient Instructions (Signed)
Access Code: [email or web address] URL: [email or web address] Date: 03/19/2021 Prepared by: Rosana Hoes  Exercises Long Sitting Ankle Eversion with Resistance - 1 x daily - 7 x weekly - 2 sets - 20 reps Long Sitting Ankle Inversion with Resistance - 1 x daily - 7 x weekly - 2 sets - 20 reps Gastroc Stretch on Wall - 1 x daily - 7 x weekly - 2 reps - 30 hold Standing Gastroc Stretch on Foam 1/2 Roll - 1 x daily - 7 x weekly - 2 sets - 30 hold Standing Soleus Stretch on Foam 1/2 Roll - 1 x daily - 7 x weekly - 2 sets - 30 hold Heel Raise on Step - 1 x daily - 7 x weekly - 2 sets - 15 reps Standing Single Leg Heel Raise - 1 x daily - 7 x weekly - 2 sets - 10 reps Single Leg Squat with Forward Reach - 1 x daily - 7 x weekly - 2 sets - 3sec hold - 10 reps Single Leg Balance with Clock Reach - 1 x daily - 7 x weekly - 3 sets - 10 reps Side Stepping with Resistance at Thighs - 1 x daily - 7 x weekly - 3 sets - 20 reps

## 2021-03-23 ENCOUNTER — Other Ambulatory Visit: Payer: Self-pay

## 2021-03-23 ENCOUNTER — Ambulatory Visit: Payer: 59 | Admitting: Nurse Practitioner

## 2021-03-23 ENCOUNTER — Encounter: Payer: Self-pay | Admitting: Nurse Practitioner

## 2021-03-23 VITALS — BP 127/76 | HR 73 | Temp 97.9°F | Ht 68.0 in | Wt 187.4 lb

## 2021-03-23 DIAGNOSIS — H938X2 Other specified disorders of left ear: Secondary | ICD-10-CM

## 2021-03-23 DIAGNOSIS — G894 Chronic pain syndrome: Secondary | ICD-10-CM | POA: Diagnosis not present

## 2021-03-23 DIAGNOSIS — H6121 Impacted cerumen, right ear: Secondary | ICD-10-CM

## 2021-03-23 DIAGNOSIS — D571 Sickle-cell disease without crisis: Secondary | ICD-10-CM | POA: Diagnosis not present

## 2021-03-23 DIAGNOSIS — R112 Nausea with vomiting, unspecified: Secondary | ICD-10-CM | POA: Diagnosis not present

## 2021-03-23 DIAGNOSIS — Z09 Encounter for follow-up examination after completed treatment for conditions other than malignant neoplasm: Secondary | ICD-10-CM | POA: Diagnosis not present

## 2021-03-23 LAB — POCT URINALYSIS DIP (CLINITEK)
Bilirubin, UA: NEGATIVE
Blood, UA: NEGATIVE
Glucose, UA: NEGATIVE mg/dL
Ketones, POC UA: NEGATIVE mg/dL
Leukocytes, UA: NEGATIVE
Nitrite, UA: NEGATIVE
POC PROTEIN,UA: NEGATIVE
Spec Grav, UA: 1.025 (ref 1.010–1.025)
Urobilinogen, UA: 2 E.U./dL — AB
pH, UA: 5.5 (ref 5.0–8.0)

## 2021-03-23 MED ORDER — ONDANSETRON 4 MG PO TBDP: 4.0000 mg | ORAL_TABLET | Freq: Three times a day (TID) | ORAL | 5 refills | Status: AC | PRN

## 2021-03-23 MED ORDER — DICYCLOMINE HCL 20 MG PO TABS: 20.0000 mg | ORAL_TABLET | Freq: Two times a day (BID) | ORAL | 11 refills | Status: DC

## 2021-03-23 NOTE — Patient Instructions (Addendum)
Sickle Cell Anemia, Adult  Sickle cell anemia is a condition where your red blood cells are shaped like sickles. Red blood cells carry oxygen through the body. Sickle-shaped cells do not live as long as normal red blood cells. They also clump together and block blood from flowing through the blood vessels. This prevents the body from getting enough oxygen. Sickle cell anemia causes organ damage and pain. It alsoincreases the risk of infection. Follow these instructions at home: Medicines Take over-the-counter and prescription medicines only as told by your doctor. If you were prescribed an antibiotic medicine, take it as told by your doctor. Do not stop taking the antibiotic even if you start to feel better. If you develop a fever, do not take medicines to lower the fever right away. Tell your doctor about the fever. Managing pain, stiffness, and swelling Try these methods to help with pain: Use a heating pad. Take a warm bath. Distract yourself, such as by watching TV. Eating and drinking Drink enough fluid to keep your pee (urine) clear or pale yellow. Drink more in hot weather and during exercise. Limit or avoid alcohol. Eat a healthy diet. Eat plenty of fruits, vegetables, whole grains, and lean protein. Take vitamins and supplements as told by your doctor. Traveling When traveling, keep these with you: Your medical information. The names of your doctors. Your medicines. If you need to take an airplane, talk to your doctor first. Activity Rest often. Avoid exercises that make your heart beat much faster, such as jogging. General instructions Do not use products that have nicotine or tobacco, such as cigarettes and e-cigarettes. If you need help quitting, ask your doctor. Consider wearing a medical alert bracelet. Avoid being in high places (high altitudes), such as mountains. Avoid very hot or cold temperatures. Avoid places where the temperature changes a lot. Keep all follow-up  visits as told by your doctor. This is important. Contact a doctor if: A joint hurts. Your feet or hands hurt or swell. You feel tired (fatigued). Get help right away if: You have symptoms of infection. These include: Fever. Chills. Being very tired. Irritability. Poor eating. Throwing up (vomiting). You feel dizzy or faint. You have new stomach pain, especially on the left side. You have a an erection (priapism) that lasts more than 4 hours. You have numbness in your arms or legs. You have a hard time moving your arms or legs. You have trouble talking. You have pain that does not go away when you take medicine. You are short of breath. You are breathing fast. You have a long-term cough. You have pain in your chest. You have a bad headache. You have a stiff neck. Your stomach looks bloated even though you did not eat much. Your skin is pale. You suddenly cannot see well. Summary Sickle cell anemia is a condition where your red blood cells are shaped like sickles. Follow your doctor's advice on ways to manage pain, food to eat, activities to do, and steps to take for safe travel. Get medical help right away if you have any signs of infection, such as a fever. This information is not intended to replace advice given to you by your health care provider. Make sure you discuss any questions you have with your healthcare provider. Document Revised: 12/06/2019 Document Reviewed: 12/06/2019 Elsevier Patient Education  2022 [employer] Inc.  Earwax Buildup, Adult The ears produce a substance called earwax that helps keep bacteria out of the ear and protects the skin in the  ear canal. Occasionally, earwax can build upin the ear and cause discomfort or hearing loss. What are the causes? This condition is caused by a buildup of earwax. Ear canals are self-cleaning. Ear wax is made in the outer part of the ear canal and generally falls out insmall amounts over time. When the self-cleaning  mechanism is not working, earwax builds up and can cause decreased hearing and discomfort. Attempting to clean ears with cotton swabs can push the earwax deep into the ear canal and cause decreased hearing andpain. What increases the risk? This condition is more likely to develop in people who: Clean their ears often with cotton swabs. Pick at their ears. Use earplugs or in-ear headphones often, or wear hearing aids. The following factors may also make you more likely to develop this condition: Being male. Being of older age. Naturally producing more earwax. Having narrow ear canals. Having earwax that is overly thick or sticky. Having excess hair in the ear canal. Having eczema. Being dehydrated. What are the signs or symptoms? Symptoms of this condition include: Reduced or muffled hearing. A feeling of fullness in the ear or feeling that the ear is plugged. Fluid coming from the ear. Ear pain or an itchy ear. Ringing in the ear. Coughing. Balance problems. An obvious piece of earwax that can be seen inside the ear canal. How is this diagnosed? This condition may be diagnosed based on: Your symptoms. Your medical history. An ear exam. During the exam, your health care provider will look into your ear with an instrument called an otoscope. You may have tests, including a hearing test. How is this treated? This condition may be treated by: Using ear drops to soften the earwax. Having the earwax removed by a health care provider. The health care provider may: Flush the ear with water. Use an instrument that has a loop on the end (curette). Use a suction device. Having surgery to remove the wax buildup. This may be done in severe cases. Follow these instructions at home:  Take over-the-counter and prescription medicines only as told by your health care provider. Do not put any objects, including cotton swabs, into your ear. You can clean the opening of your ear canal with a  washcloth or facial tissue. Follow instructions from your health care provider about cleaning your ears. Do not overclean your ears. Drink enough fluid to keep your urine pale yellow. This will help to thin the earwax. Keep all follow-up visits as told. If earwax builds up in your ears often or if you use hearing aids, consider seeing your health care provider for routine, preventive ear cleanings. Ask your health care provider how often you should schedule your cleanings. If you have hearing aids, clean them according to instructions from the manufacturer and your health care provider. Contact a health care provider if: You have ear pain. You develop a fever. You have pus or other fluid coming from your ear. You have hearing loss. You have ringing in your ears that does not go away. You feel like the room is spinning (vertigo). Your symptoms do not improve with treatment. Get help right away if: You have bleeding from the affected ear. You have severe ear pain. Summary Earwax can build up in the ear and cause discomfort or hearing loss. The most common symptoms of this condition include reduced or muffled hearing, a feeling of fullness in the ear, or feeling that the ear is plugged. This condition may be diagnosed based on your  symptoms, your medical history, and an ear exam. This condition may be treated by using ear drops to soften the earwax or by having the earwax removed by a health care provider. Do not put any objects, including cotton swabs, into your ear. You can clean the opening of your ear canal with a washcloth or facial tissue. This information is not intended to replace advice given to you by your health care provider. Make sure you discuss any questions you have with your healthcare provider. Document Revised: 10/30/2019 Document Reviewed: 10/30/2019 Elsevier Patient Education  2022 [employer].

## 2021-03-23 NOTE — Progress Notes (Signed)
Austin [name], [place]  [address] Phone:  [phone]   Fax:  [phone]   Established Patient Office Visit  Subjective:  Patient ID: Austin [name], male    DOB: 1991/06/11  Age: 30 y.o. MRN: [id number]  CC:  Chief Complaint  Patient presents with   Follow-up    Pt is here today for his 2 month follow up visit. Pt states that his left ear is still clogged up. Pt states he has been flushing out both his ears but only the right ear is unclogged.    HPI Austin [name] presents for follow up. He  has a past medical history of Acute maxillary sinusitis, Eczema, and Sickle cell anemia (H[facility]) (.).   Cerumen Impaction [name] presents with clogged in the left ear for the past  several  days. There is a prior history of cerumen impaction. The patient has been using ear drops to loosen wax immediately prior to this visit. The patient denies ear pain.  Denies fever, headache, cough, wheezing, shortness of breath, chest pains, abdominal pain, back pain, hip pain, or leg pain. Denies any open wounds, skin irritation.   He was to complete PT for a 4 weeks 2 times per week. He reports that this went well.     Past Medical History:  Diagnosis Date   Acute maxillary sinusitis    Eczema    Sickle cell anemia ([place] [name]) .    Past Surgical History:  Procedure Laterality Date   FOOT SURGERY Left 12/25/2020   WISDOM TOOTH EXTRACTION      Family History  Problem Relation Age of Onset   Sickle cell trait Mother    Diabetes Father    Hypertension Father     Social History   Socioeconomic History   Marital status: Married    Spouse name: Not on file   Number of children: Not on file   Years of education: Not on file   Highest education level: Not on file  Occupational History   Not on file  Tobacco Use   Smoking status: Former    Packs/day: 0.10    Types: Cigarettes   Smokeless tobacco: Never  Vaping Use   Vaping Use: Never used   Substance and Sexual Activity   Alcohol use: Not Currently   Drug use: Yes    Types: Marijuana    Comment: occasionally   Sexual activity: Yes    Birth control/protection: None    Comment: Married  Other Topics Concern   Not on file  Social History Narrative   Not on file   Social Determinants of Health   Financial Resource Strain: Not on file  Food Insecurity: Not on file  Transportation Needs: Not on file  Physical Activity: Not on file  Stress: Not on file  Social Connections: Not on file  Intimate Partner Violence: Not on file    Outpatient Medications Prior to Visit  Medication Sig Dispense Refill   lidocaine (LIDODERM) 5 % Place 1 patch onto the skin daily. Remove & Discard patch within 12 hours or as directed by MD 15 patch 0   loratadine (CLARITIN) 10 MG tablet Take 1 tablet (10 mg total) by mouth daily. 30 tablet 6   Oxycodone HCl 10 MG TABS Take 1 tablet (10 mg total) by mouth 4 (four) times daily as needed for up to 15 days. 60 tablet 0   dicyclomine (BENTYL) 20 MG tablet Take 1 tablet (20  mg total) by mouth 2 (two) times daily. 20 tablet 0   ondansetron (ZOFRAN ODT) 4 MG disintegrating tablet Take 1 tablet (4 mg total) by mouth every 8 (eight) hours as needed for nausea or vomiting. 20 tablet 0   Olopatadine HCl 0.2 % SOLN Apply 1 drop to eye in the morning and at bedtime. (Patient not taking: Reported on 03/23/2021) 3 mL 11   promethazine (PHENERGAN) 25 MG tablet Take 1 tablet (25 mg total) by mouth every 6 (six) hours as needed for nausea or vomiting. (Patient not taking: Reported on 03/23/2021) 30 tablet 5   No facility-administered medications prior to visit.    No Known Allergies  ROS Review of Systems    Objective:    Physical Exam HENT:     Head: Normocephalic and atraumatic.     Right Ear: There is impacted cerumen.     Left Ear: Tympanic membrane normal.     Mouth/Throat:     Mouth: Mucous membranes are moist.  Cardiovascular:     Rate and  Rhythm: Normal rate and regular rhythm.     Pulses: Normal pulses.     Heart sounds: Normal heart sounds.  Pulmonary:     Effort: Pulmonary effort is normal.     Breath sounds: Normal breath sounds.  Musculoskeletal:        General: Normal range of motion.     Cervical back: Normal range of motion.  Skin:    General: Skin is warm and dry.     Capillary Refill: Capillary refill takes less than 2 seconds.  Neurological:     General: No focal deficit present.     Mental Status: He is alert and oriented to person, place, and time.  Psychiatric:        Mood and Affect: Mood normal.        Behavior: Behavior normal.        Thought Content: Thought content normal.        Judgment: Judgment normal.    BP 127/76   Pulse 73   Temp 97.9 F (36.6 C)   Ht $R'5\' 8"'KZ$  (1.727 m)   Wt 187 lb 6.4 oz (85 kg)   SpO2 100%   BMI 28.49 kg/m  Wt Readings from Last 3 Encounters:  03/23/21 187 lb 6.4 oz (85 kg)  03/05/21 194 lb 3.6 oz (88.1 kg)  03/04/21 195 lb 1.7 oz (88.5 kg)     Health Maintenance Due  Topic Date Due   INFLUENZA VACCINE  02/23/2021    There are no preventive care reminders to display for this patient.  Lab Results  Component Value Date   TSH 0.662 08/12/2017   Lab Results  Component Value Date   WBC 8.8 03/07/2021   HGB 11.1 (L) 03/07/2021   HCT 31.4 (L) 03/07/2021   MCV 81.3 03/07/2021   PLT 105 (L) 03/07/2021   Lab Results  Component Value Date   NA 135 03/07/2021   K 3.5 03/07/2021   CO2 26 03/07/2021   GLUCOSE 97 03/07/2021   BUN 7 03/07/2021   CREATININE 0.70 03/07/2021   BILITOT 3.0 (H) 03/07/2021   ALKPHOS 41 03/07/2021   AST 16 03/07/2021   ALT 49 (H) 03/07/2021   PROT 7.5 03/07/2021   ALBUMIN 4.2 03/07/2021   CALCIUM 8.9 03/07/2021   ANIONGAP 8 03/07/2021   EGFR 106 11/13/2020   No results found for: CHOL No results found for: HDL No results found for: LDLCALC No results found  for: TRIG No results found for: CHOLHDL Lab Results  Component  Value Date   HGBA1C 4.2 08/12/2017      Assessment & Plan:   Problem List Items Addressed This Visit       Other   Chronic pain syndrome   Other Visit Diagnoses     Sickle-cell disease without crisis ([place])    -  Primary Stable  We discussed the need for good hydration, monitoring of hydration status, avoidance of heat, cold, stress, and infection triggers. We discussed the risks and benefits of Hydrea, including bone marrow suppression, the possibility of GI upset, skin ulcers, hair thinning, and teratogenicity. The patient was reminded of the need to seek medical attention of any symptoms of bleeding, anemia, or infection. Continue folic acid 1 mg daily to prevent aplastic bone marrow crises.    Follow up       Relevant Orders   POCT URINALYSIS DIP (CLINITEK) (Completed)   Clogged ear, left     Persistent and ongoing Exam negative Hearing test completed    Relevant Orders   Ambulatory referral to ENT   Right ear impacted cerumen     Impacted however feels fine  Will hold in office tx and allow home tx     Relevant Orders   Ambulatory referral to ENT   Nausea and vomiting in adult     Persistent  Seeking evaluation for possible cause   Relevant Orders   Ambulatory referral to Gastroenterology       Meds ordered this encounter  Medications   ondansetron (ZOFRAN ODT) 4 MG disintegrating tablet    Sig: Take 1 tablet (4 mg total) by mouth every 8 (eight) hours as needed for up to 15 days for nausea or vomiting.    Dispense:  30 tablet    Refill:  5    Order Specific Question:   Supervising Provider    Answer:   Tresa Garter [[id number]]   dicyclomine (BENTYL) 20 MG tablet    Sig: Take 1 tablet (20 mg total) by mouth 2 (two) times daily.    Dispense:  60 tablet    Refill:  11    Order Specific Question:   Supervising Provider    Answer:   Tresa Garter [[id number]]     Follow-up: Return in about 2 months (around 05/23/2021) for Follow up SCD [id number].     Vevelyn Francois, NP

## 2021-04-02 ENCOUNTER — Other Ambulatory Visit: Payer: Self-pay | Admitting: Nurse Practitioner

## 2021-04-02 ENCOUNTER — Other Ambulatory Visit: Payer: Self-pay

## 2021-04-02 DIAGNOSIS — F1190 Opioid use, unspecified, uncomplicated: Secondary | ICD-10-CM

## 2021-04-02 DIAGNOSIS — G894 Chronic pain syndrome: Secondary | ICD-10-CM

## 2021-04-02 MED ORDER — OXYCODONE HCL 10 MG PO TABS: 10.0000 mg | ORAL_TABLET | Freq: Four times a day (QID) | ORAL | 0 refills | Status: DC | PRN

## 2021-04-16 ENCOUNTER — Encounter: Payer: Self-pay | Admitting: Podiatry

## 2021-04-16 ENCOUNTER — Other Ambulatory Visit: Payer: Self-pay

## 2021-04-16 DIAGNOSIS — G894 Chronic pain syndrome: Secondary | ICD-10-CM

## 2021-04-16 DIAGNOSIS — F1190 Opioid use, unspecified, uncomplicated: Secondary | ICD-10-CM

## 2021-04-16 MED ORDER — OXYCODONE HCL 10 MG PO TABS: 10.0000 mg | ORAL_TABLET | Freq: Four times a day (QID) | ORAL | 0 refills | Status: DC | PRN

## 2021-04-20 ENCOUNTER — Other Ambulatory Visit: Payer: Self-pay

## 2021-04-20 DIAGNOSIS — F1190 Opioid use, unspecified, uncomplicated: Secondary | ICD-10-CM

## 2021-04-20 DIAGNOSIS — G894 Chronic pain syndrome: Secondary | ICD-10-CM

## 2021-04-27 ENCOUNTER — Other Ambulatory Visit: Payer: Self-pay

## 2021-04-27 ENCOUNTER — Ambulatory Visit (INDEPENDENT_AMBULATORY_CARE_PROVIDER_SITE_OTHER): Payer: 59 | Admitting: Otolaryngology

## 2021-04-27 DIAGNOSIS — H6123 Impacted cerumen, bilateral: Secondary | ICD-10-CM

## 2021-04-27 DIAGNOSIS — H6522 Chronic serous otitis media, left ear: Secondary | ICD-10-CM | POA: Diagnosis not present

## 2021-04-27 MED ORDER — AMOXICILLIN 875 MG PO TABS: 875.0000 mg | ORAL_TABLET | Freq: Two times a day (BID) | ORAL | 0 refills | Status: AC

## 2021-04-27 MED ORDER — TRIAMCINOLONE ACETONIDE 55 MCG/ACT NA AERO: 2.0000 | INHALATION_SPRAY | Freq: Every day | NASAL | 4 refills | Status: DC

## 2021-04-27 NOTE — Progress Notes (Signed)
HPI: Austin [name] is a 30 y.o. male who presents is referred by his PCP for evaluation of decreased hearing in the left ear for the past 2 months.  He thought it might be wax buildup.  He has noticed decreased hearing in the left ear for the past 2 months ever since he tried to clean wax out of the left ear.  He denies any ear pain..  Past Medical History:  Diagnosis Date   Acute maxillary sinusitis    Eczema    Sickle cell anemia (HCC) .   Past Surgical History:  Procedure Laterality Date   FOOT SURGERY Left 12/25/2020   WISDOM TOOTH EXTRACTION     Social History   Socioeconomic History   Marital status: Married    Spouse name: Not on file   Number of children: Not on file   Years of education: Not on file   Highest education level: Not on file  Occupational History   Not on file  Tobacco Use   Smoking status: Former    Packs/day: 0.10    Types: Cigarettes   Smokeless tobacco: Never  Vaping Use   Vaping Use: Never used  Substance and Sexual Activity   Alcohol use: Not Currently   Drug use: Yes    Types: Marijuana    Comment: occasionally   Sexual activity: Yes    Birth control/protection: None    Comment: Married  Other Topics Concern   Not on file  Social History Narrative   Not on file   Social Determinants of Health   Financial Resource Strain: Not on file  Food Insecurity: Not on file  Transportation Needs: Not on file  Physical Activity: Not on file  Stress: Not on file  Social Connections: Not on file   Family History  Problem Relation Age of Onset   Sickle cell trait Mother    Diabetes Father    Hypertension Father    No Known Allergies Prior to Admission medications   Medication Sig Start Date End Date Taking? Authorizing Provider  dicyclomine (BENTYL) 20 MG tablet Take 1 tablet (20 mg total) by mouth 2 (two) times daily. 03/23/21 03/23/22  Barbette Merino, NP  lidocaine (LIDODERM) 5 % Place 1 patch onto the skin daily. Remove & Discard  patch within 12 hours or as directed by MD 02/09/21   Gwenevere Abbot, MD  loratadine (CLARITIN) 10 MG tablet Take 1 tablet (10 mg total) by mouth daily. 11/13/20   Barbette Merino, NP  Olopatadine HCl 0.2 % SOLN Apply 1 drop to eye in the morning and at bedtime. Patient not taking: Reported on 03/23/2021 11/13/20 11/13/21  Barbette Merino, NP  Oxycodone HCl 10 MG TABS Take 1 tablet (10 mg total) by mouth 4 (four) times daily as needed for up to 15 days. 04/17/21 05/02/21  Barbette Merino, NP  promethazine (PHENERGAN) 25 MG tablet Take 1 tablet (25 mg total) by mouth every 6 (six) hours as needed for nausea or vomiting. Patient not taking: Reported on 03/23/2021 01/29/21   Barbette Merino, NP     Positive ROS: Otherwise negative  All other systems have been reviewed and were otherwise negative with the exception of those mentioned in the HPI and as above.  Physical Exam: Constitutional: Alert, well-appearing, no acute distress Ears: External ears without lesions or tenderness.  Right ear canal was obstructed with cerumen that was cleaned with this suction.  Right TM is clear.  The left TM had  minimal cerumen that was cleaned with suction however the left TM was retracted with a serous otitis media.  I was able to insufflate some air behind the left TM with improvement of his hearing. Nasal: External nose without lesions. Septum with minimal deformity and mild rhinitis.  On nasopharyngoscopy the nasopharynx was clear with no obstruction of the eustachian tube on the left side.  Mild rhinitis with no signs of infection.. Oral: Lips and gums without lesions. Tongue and palate mucosa without lesions. Posterior oropharynx clear. Neck: No palpable adenopathy or masses Respiratory: Breathing comfortably  Skin: No facial/neck lesions or rash noted.  Cerumen impaction removal  Date/Time: 04/27/2021 3:35 PM Performed by: Drema Halon, MD Authorized by: Drema Halon, MD   Consent:    Consent  obtained:  Verbal   Consent given by:  Patient   Risks discussed:  Pain and bleeding Procedure details:    Location:  L ear and R ear   Procedure type: suction   Post-procedure details:    Inspection:  TM intact and canal normal   Hearing quality:  Improved   Procedure completion:  Tolerated well, no immediate complications Comments:     Right side was worse than the left the right TM was clear.  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  Plan: Prescribed Nasacort 2 sprays each nostril at night. Also prescribed amoxicillin 875 mg twice daily for 10 days. Reviewed with him concerning trying to "pop" the ears if they become obstructed. He will notify us if hearing is not resolved after 2 weeks.   Narda Bonds, MD   CC:

## 2021-05-01 ENCOUNTER — Encounter: Payer: Self-pay | Admitting: Podiatry

## 2021-05-04 ENCOUNTER — Other Ambulatory Visit: Payer: Self-pay | Admitting: Nurse Practitioner

## 2021-05-04 DIAGNOSIS — G894 Chronic pain syndrome: Secondary | ICD-10-CM

## 2021-05-04 DIAGNOSIS — F1190 Opioid use, unspecified, uncomplicated: Secondary | ICD-10-CM

## 2021-05-04 MED ORDER — OXYCODONE HCL 10 MG PO TABS: 10.0000 mg | ORAL_TABLET | Freq: Four times a day (QID) | ORAL | 0 refills | Status: DC | PRN

## 2021-05-05 ENCOUNTER — Encounter (HOSPITAL_COMMUNITY): Payer: Self-pay

## 2021-05-05 ENCOUNTER — Other Ambulatory Visit: Payer: Self-pay

## 2021-05-05 ENCOUNTER — Emergency Department (HOSPITAL_COMMUNITY)
Admission: EM | Admit: 2021-05-05 | Discharge: 2021-05-05 | Disposition: A | Discharge status: Home / Self Care | Payer: 59 | Attending: Emergency Medicine | Admitting: Emergency Medicine

## 2021-05-05 DIAGNOSIS — I10 Essential (primary) hypertension: Secondary | ICD-10-CM | POA: Diagnosis not present

## 2021-05-05 DIAGNOSIS — Z87891 Personal history of nicotine dependence: Secondary | ICD-10-CM | POA: Diagnosis not present

## 2021-05-05 DIAGNOSIS — R109 Unspecified abdominal pain: Secondary | ICD-10-CM | POA: Insufficient documentation

## 2021-05-05 DIAGNOSIS — R61 Generalized hyperhidrosis: Secondary | ICD-10-CM | POA: Insufficient documentation

## 2021-05-05 DIAGNOSIS — R110 Nausea: Secondary | ICD-10-CM | POA: Diagnosis not present

## 2021-05-05 DIAGNOSIS — D5700 Hb-SS disease with crisis, unspecified: Secondary | ICD-10-CM

## 2021-05-05 DIAGNOSIS — R1111 Vomiting without nausea: Secondary | ICD-10-CM | POA: Diagnosis not present

## 2021-05-05 DIAGNOSIS — R1110 Vomiting, unspecified: Secondary | ICD-10-CM | POA: Diagnosis not present

## 2021-05-05 DIAGNOSIS — M545 Low back pain, unspecified: Secondary | ICD-10-CM | POA: Insufficient documentation

## 2021-05-05 DIAGNOSIS — R1084 Generalized abdominal pain: Secondary | ICD-10-CM | POA: Diagnosis not present

## 2021-05-05 LAB — CBC WITH DIFFERENTIAL/PLATELET
Abs Immature Granulocytes: 0.06 10*3/uL (ref 0.00–0.07)
Basophils Absolute: 0 10*3/uL (ref 0.0–0.1)
Basophils Relative: 0 %
Eosinophils Absolute: 0 10*3/uL (ref 0.0–0.5)
Eosinophils Relative: 0 %
HCT: 41.2 % (ref 39.0–52.0)
Hemoglobin: 14.6 g/dL (ref 13.0–17.0)
Immature Granulocytes: 0 %
Lymphocytes Relative: 11 %
Lymphs Abs: 1.7 10*3/uL (ref 0.7–4.0)
MCH: 28.5 pg (ref 26.0–34.0)
MCHC: 35.4 g/dL (ref 30.0–36.0)
MCV: 80.3 fL (ref 80.0–100.0)
Monocytes Absolute: 1 10*3/uL (ref 0.1–1.0)
Monocytes Relative: 6 %
Neutro Abs: 13.2 10*3/uL — ABNORMAL HIGH (ref 1.7–7.7)
Neutrophils Relative %: 83 %
Platelets: 179 10*3/uL (ref 150–400)
RBC: 5.13 MIL/uL (ref 4.22–5.81)
RDW: 14.3 % (ref 11.5–15.5)
WBC: 15.9 10*3/uL — ABNORMAL HIGH (ref 4.0–10.5)
nRBC: 0.3 % — ABNORMAL HIGH (ref 0.0–0.2)

## 2021-05-05 LAB — COMPREHENSIVE METABOLIC PANEL
ALT: 25 U/L (ref 0–44)
AST: 12 U/L — ABNORMAL LOW (ref 15–41)
Albumin: 5.4 g/dL — ABNORMAL HIGH (ref 3.5–5.0)
Alkaline Phosphatase: 61 U/L (ref 38–126)
Anion gap: 8 (ref 5–15)
BUN: 8 mg/dL (ref 6–20)
CO2: 26 mmol/L (ref 22–32)
Calcium: 10.1 mg/dL (ref 8.9–10.3)
Chloride: 103 mmol/L (ref 98–111)
Creatinine, Ser: 0.84 mg/dL (ref 0.61–1.24)
GFR, Estimated: >60 mL/min (ref >60)
Glucose, Bld: 108 mg/dL — ABNORMAL HIGH (ref 70–99)
Potassium: 4.2 mmol/L (ref 3.5–5.1)
Sodium: 137 mmol/L (ref 135–145)
Total Bilirubin: 1.9 mg/dL — ABNORMAL HIGH (ref 0.3–1.2)
Total Protein: 9.2 g/dL — ABNORMAL HIGH (ref 6.5–8.1)

## 2021-05-05 LAB — RETICULOCYTES
Immature Retic Fract: 25.4 % — ABNORMAL HIGH (ref 2.3–15.9)
RBC.: 5.03 MIL/uL (ref 4.22–5.81)
Retic Count, Absolute: 214.8 10*3/uL — ABNORMAL HIGH (ref 19.0–186.0)
Retic Ct Pct: 4.3 % — ABNORMAL HIGH (ref 0.4–3.1)

## 2021-05-05 LAB — LIPASE, BLOOD: Lipase: 25 U/L (ref 11–51)

## 2021-05-05 MED ORDER — METOCLOPRAMIDE HCL 10 MG PO TABS: 10.0000 mg | ORAL_TABLET | Freq: Once | ORAL | Status: DC

## 2021-05-05 MED ORDER — METOCLOPRAMIDE HCL 5 MG/ML IJ SOLN
10.0000 mg | Freq: Once | INTRAMUSCULAR | Status: AC
  Administered 2021-05-05: 10 mg via INTRAMUSCULAR
  Filled 2021-05-05: qty 2

## 2021-05-05 MED ORDER — HYDROMORPHONE HCL 2 MG/ML IJ SOLN
2.0000 mg | INTRAMUSCULAR | Status: AC
  Administered 2021-05-05: 2 mg via INTRAVENOUS
  Filled 2021-05-05: qty 1

## 2021-05-05 MED ORDER — HYDROMORPHONE HCL 2 MG/ML IJ SOLN
2.0000 mg | Freq: Once | INTRAMUSCULAR | Status: AC
  Administered 2021-05-05: 2 mg via INTRAMUSCULAR
  Filled 2021-05-05: qty 1

## 2021-05-05 MED ORDER — HYDROMORPHONE HCL 1 MG/ML IJ SOLN: 1.0000 mg | INTRAMUSCULAR | Status: DC

## 2021-05-05 MED ORDER — ONDANSETRON HCL 4 MG/2ML IJ SOLN
4.0000 mg | INTRAMUSCULAR | Status: DC | PRN
  Administered 2021-05-05: 4 mg via INTRAVENOUS
  Filled 2021-05-05: qty 2

## 2021-05-05 MED ORDER — HYDROMORPHONE HCL 2 MG/ML IJ SOLN: 2.0000 mg | INTRAMUSCULAR | Status: DC

## 2021-05-05 MED ORDER — HYDROMORPHONE HCL 1 MG/ML IJ SOLN: 0.5000 mg | INTRAMUSCULAR | Status: DC

## 2021-05-05 MED ORDER — DIPHENHYDRAMINE HCL 50 MG/ML IJ SOLN
25.0000 mg | Freq: Once | INTRAMUSCULAR | Status: AC
  Administered 2021-05-05: 25 mg via INTRAVENOUS
  Filled 2021-05-05: qty 1

## 2021-05-05 MED ORDER — DEXTROSE-NACL 5-0.45 % IV SOLN: INTRAVENOUS | Status: DC

## 2021-05-05 NOTE — ED Notes (Signed)
IV team at bedside 

## 2021-05-05 NOTE — ED Provider Notes (Signed)
Pt seen by Dr Deretha Emory.  Please see his note.  Pt is feeling better at this time.  Ready for discharge.     Linwood Dibbles, MD 05/05/21 [phone]

## 2021-05-05 NOTE — ED Triage Notes (Signed)
Per [facility] crisis that started this am-complaining of abdominal pain and back pain-some vomiting-took home Zofran

## 2021-05-05 NOTE — ED Provider Notes (Signed)
[facility]-EMERGENCY DEPT Provider Note   CSN: [id number] Arrival date & time: 05/05/21  9381     History No chief complaint on file.   Austin [name] is a 30 y.o. male.  Patient with known history of sickle cell disease as well as chronic pain.  Patient this a.m. with a complaint of abdominal pain and but mostly low back pain that reminds him of his sickle cell.  He is diaphoretic.  Has had some vomiting.  Denies any fevers.      Past Medical History:  Diagnosis Date   Acute maxillary sinusitis    Eczema    Sickle cell anemia (HCC) .    Patient Active Problem List   Diagnosis Date Noted   Hypokalemia 03/05/2021   Anemia of chronic disease 03/05/2021   Vitamin D deficiency 11/16/2020   Bilateral lower extremity pain 10/17/2019   Sickle cell anemia without crisis (HCC) 09/20/2019   Chronic pain syndrome 09/20/2019   Nausea 09/20/2019   Chronic, continuous use of opioids 09/20/2019   Medication management 03/20/2019   Bilious vomiting without nausea    Viral gastroenteritis 01/23/2019   Nausea vomiting and diarrhea    Generalized abdominal pain    Hb-SS disease with crisis (HCC)    Dehydration    Sickle cell pain crisis (HCC) 05/18/2018   Acute sickle cell crisis (HCC) 12/15/2017   Thrombocytopenia (HCC) 12/15/2017   Splenomegaly 12/15/2017   Sickle cell anemia with crisis (HCC) 12/15/2017   Spleen enlarged 08/18/2017    Past Surgical History:  Procedure Laterality Date   FOOT SURGERY Left 12/25/2020   WISDOM TOOTH EXTRACTION         Family History  Problem Relation Age of Onset   Sickle cell trait Mother    Diabetes Father    Hypertension Father     Social History   Tobacco Use   Smoking status: Former    Packs/day: 0.10    Types: Cigarettes   Smokeless tobacco: Never  Vaping Use   Vaping Use: Never used  Substance Use Topics   Alcohol use: Not Currently   Drug use: Yes    Types: Marijuana    Comment: occasionally     Home Medications Prior to Admission medications   Medication Sig Start Date End Date Taking? Authorizing Provider  amoxicillin (AMOXIL) 875 MG tablet Take 1 tablet (875 mg total) by mouth 2 (two) times daily for 10 days. 04/27/21 05/07/21  Drema Halon, MD  dicyclomine (BENTYL) 20 MG tablet Take 1 tablet (20 mg total) by mouth 2 (two) times daily. 03/23/21 03/23/22  Barbette Merino, NP  lidocaine (LIDODERM) 5 % Place 1 patch onto the skin daily. Remove & Discard patch within 12 hours or as directed by MD 02/09/21   Gwenevere Abbot, MD  loratadine (CLARITIN) 10 MG tablet Take 1 tablet (10 mg total) by mouth daily. 11/13/20   Barbette Merino, NP  Olopatadine HCl 0.2 % SOLN Apply 1 drop to eye in the morning and at bedtime. Patient not taking: Reported on 03/23/2021 11/13/20 11/13/21  Barbette Merino, NP  Oxycodone HCl 10 MG TABS Take 1 tablet (10 mg total) by mouth 4 (four) times daily as needed for up to 15 days. 05/05/21 05/20/21  Barbette Merino, NP  promethazine (PHENERGAN) 25 MG tablet Take 1 tablet (25 mg total) by mouth every 6 (six) hours as needed for nausea or vomiting. Patient not taking: Reported on 03/23/2021 01/29/21   Thad Ranger  M, NP  triamcinolone (NASACORT) 55 MCG/ACT AERO nasal inhaler Place 2 sprays into the nose daily. 2 sprays each nostril at night 04/27/21   Drema Halon, MD    Allergies    Patient has no known allergies.  Review of Systems   Review of Systems  Constitutional:  Negative for chills and fever.  HENT:  Negative for ear pain and sore throat.   Eyes:  Negative for pain and visual disturbance.  Respiratory:  Negative for cough and shortness of breath.   Cardiovascular:  Negative for chest pain and palpitations.  Gastrointestinal:  Positive for abdominal pain, nausea and vomiting.  Genitourinary:  Negative for dysuria and hematuria.  Musculoskeletal:  Positive for back pain. Negative for arthralgias.  Skin:  Negative for color change and rash.   Neurological:  Negative for seizures and syncope.  All other systems reviewed and are negative.  Physical Exam Updated Vital Signs BP (!) 146/98 (BP Location: Left Arm)   Pulse 88   Temp 97.6 F (36.4 C) (Oral)   Resp 20   Ht 1.702 m (5\' 7" )   Wt 84.8 kg   SpO2 100%   BMI 29.29 kg/m   Physical Exam Vitals and nursing note reviewed.  Constitutional:      Appearance: He is well-developed. He is ill-appearing and diaphoretic.  HENT:     Head: Normocephalic and atraumatic.  Eyes:     Extraocular Movements: Extraocular movements intact.     Conjunctiva/sclera: Conjunctivae normal.     Pupils: Pupils are equal, round, and reactive to light.  Cardiovascular:     Rate and Rhythm: Normal rate and regular rhythm.     Heart sounds: No murmur heard. Pulmonary:     Effort: Pulmonary effort is normal. No respiratory distress.     Breath sounds: Normal breath sounds.  Abdominal:     Palpations: Abdomen is soft.     Tenderness: There is abdominal tenderness.     Comments: Diffuse tenderness to palpation.  Musculoskeletal:        General: Normal range of motion.     Cervical back: Neck supple.  Skin:    General: Skin is warm.  Neurological:     General: No focal deficit present.     Mental Status: He is alert and oriented to person, place, and time.    ED Results / Procedures / Treatments   Labs (all labs ordered are listed, but only abnormal results are displayed) Labs Reviewed  CBC WITH DIFFERENTIAL/PLATELET  RETICULOCYTES  COMPREHENSIVE METABOLIC PANEL  LIPASE, BLOOD    EKG None  Radiology No results found.  Procedures Procedures   Medications Ordered in ED Medications  HYDROmorphone (DILAUDID) injection 2 mg (2 mg Intramuscular Given 05/05/21 0842)  metoCLOPramide (REGLAN) injection 10 mg (10 mg Intramuscular Given 05/05/21 0840)    ED Course  I have reviewed the triage vital signs and the nursing notes.  Pertinent labs & imaging results that were  available during my care of the patient were reviewed by me and considered in my medical decision making (see chart for details).    MDM Rules/Calculators/A&P                         CRITICAL CARE Performed by: 07/05/21 Total critical care time: 35 minutes Critical care time was exclusive of separately billable procedures and treating other patients. Critical care was necessary to treat or prevent imminent or life-threatening deterioration. Critical care was time  spent personally by me on the following activities: development of treatment plan with patient and/or surrogate as well as nursing, discussions with consultants, evaluation of patient's response to treatment, examination of patient, obtaining history from patient or surrogate, ordering and performing treatments and interventions, ordering and review of laboratory studies, ordering and review of radiographic studies, pulse oximetry and re-evaluation of patient's condition.   Patient medically screened in triage.  Patient given IM Reglan given IM hydromorphone.  Basic sickle cell labs ordered.  We will go ahead and give him some IV fluids.  And also add on some additional stuff with the sickle cell protocol.  Patient feels that symptoms are more related to his sickle cell disease.  However withdrawal from narcotics is a possibility.   Final Clinical Impression(s) / ED Diagnoses Final diagnoses:  None    Rx / DC Orders ED Discharge Orders     None        Vanetta Mulders, MD 05/05/21 1528

## 2021-05-05 NOTE — ED Notes (Signed)
Two unsuccessful IV attempts, IV team contacted

## 2021-05-05 NOTE — Discharge Instructions (Addendum)
Follow-up with your sickle cell doctor.  Continue your current medication regimen

## 2021-05-05 NOTE — ED Provider Notes (Signed)
Emergency Medicine Provider Triage Evaluation Note  Austin [name] , a 30 y.o. male  was evaluated in triage.  Pt complains of sickle cell pain crisis. He woke up with abdominal pain, back pain and vomiting. He has had multiple episodes of vomiting NBNB vomitus.  States that his happens with typical crisis sometimes. No fever or cp. Took bentyl and zofran without relief  Review of Systems  Positive: Vomiting/ abdominal pain Negative: Fever, cp  Physical Exam  BP (!) 146/98 (BP Location: Left Arm)   Pulse 88   Temp 97.6 F (36.4 C) (Oral)   Resp 20   Ht 5\' 7"  (1.702 m)   Wt 84.8 kg   SpO2 100%   BMI 29.29 kg/m  Gen:   Awake, actively vomiting Resp:  Normal effort  MSK:   Moves extremities without difficulty  Other:  Abd tenderness  Medical Decision Making  Medically screening exam initiated at 8:26 AM.  Appropriate orders placed.  Austin [name] was informed that the remainder of the evaluation will be completed by another provider, this initial triage assessment does not replace that evaluation, and the importance of remaining in the ED until their evaluation is complete.  Patient with c/o abd/ nausea/ vomiting. Labs ordered. Pain meds/ anitemetics given.   Austin Edward, PA-C 05/05/21 07/05/21    [id number], MD 05/05/21 [phone]

## 2021-05-06 ENCOUNTER — Ambulatory Visit (INDEPENDENT_AMBULATORY_CARE_PROVIDER_SITE_OTHER): Payer: 59 | Admitting: Podiatry

## 2021-05-06 ENCOUNTER — Encounter: Payer: Self-pay | Admitting: Podiatry

## 2021-05-06 DIAGNOSIS — Z9889 Other specified postprocedural states: Secondary | ICD-10-CM | POA: Diagnosis not present

## 2021-05-06 DIAGNOSIS — M722 Plantar fascial fibromatosis: Secondary | ICD-10-CM

## 2021-05-06 NOTE — Progress Notes (Signed)
   Subjective:  Patient presents today status post EPF left. DOS: 09/14/2020.  Patient states that when he returned to work and increase his activity he began to experience severe pain and tenderness to the left plantar heel.  The pain has slowly increased.  He says that after 2 hours he is in a significant amount of pain and unable to perform his job duties.  He presents for further treatment and evaluation  Past Medical History:  Diagnosis Date   Acute maxillary sinusitis    Eczema    Sickle cell anemia (HCC) .      Objective/Physical Exam Neurovascular status intact.  Skin incisions appear to be well coapted and healed.  Today there is a significant amount of pain and tenderness associated to the left plantar heel.   Assessment: 1. s/p EPF LT.  DOS: 12/25/2020   Plan of Care:  1. Patient was evaluated. 2.  Due to the severe increase in pain and tenderness associated to the left heel when the patient returned to work we are going to refrain from work at the moment.  Note was provided for the patient today to refrain from work x2 additional months.  The patient's healing may be prolonged given his PMHx of sickle cell 3.  Continue wearing good supportive shoes and orthotics  4.  Return to clinic in 2 months  Felecia Shelling, DPM [facility]  Dr. Felecia Shelling, DPM    [address] [place], [place] [address]                Office [phone]  Fax [phone]

## 2021-05-07 DIAGNOSIS — M79676 Pain in unspecified toe(s): Secondary | ICD-10-CM

## 2021-05-11 DIAGNOSIS — M79676 Pain in unspecified toe(s): Secondary | ICD-10-CM

## 2021-05-20 ENCOUNTER — Other Ambulatory Visit: Payer: Self-pay

## 2021-05-20 DIAGNOSIS — G894 Chronic pain syndrome: Secondary | ICD-10-CM

## 2021-05-20 DIAGNOSIS — F1190 Opioid use, unspecified, uncomplicated: Secondary | ICD-10-CM

## 2021-05-20 MED ORDER — OXYCODONE HCL 10 MG PO TABS: 10.0000 mg | ORAL_TABLET | Freq: Four times a day (QID) | ORAL | 0 refills | Status: DC | PRN

## 2021-05-25 ENCOUNTER — Telehealth: Payer: 59 | Admitting: Nurse Practitioner

## 2021-05-27 ENCOUNTER — Telehealth (INDEPENDENT_AMBULATORY_CARE_PROVIDER_SITE_OTHER): Payer: 59 | Admitting: Nurse Practitioner

## 2021-05-27 ENCOUNTER — Encounter: Payer: Self-pay | Admitting: Nurse Practitioner

## 2021-05-27 ENCOUNTER — Other Ambulatory Visit: Payer: Self-pay

## 2021-05-27 DIAGNOSIS — G894 Chronic pain syndrome: Secondary | ICD-10-CM | POA: Diagnosis not present

## 2021-05-27 DIAGNOSIS — F1190 Opioid use, unspecified, uncomplicated: Secondary | ICD-10-CM

## 2021-05-27 DIAGNOSIS — E559 Vitamin D deficiency, unspecified: Secondary | ICD-10-CM | POA: Diagnosis not present

## 2021-05-27 DIAGNOSIS — D571 Sickle-cell disease without crisis: Secondary | ICD-10-CM

## 2021-05-27 MED ORDER — ERGOCALCIFEROL 1.25 MG (50000 UT) PO CAPS: 50000.0000 [IU] | ORAL_CAPSULE | ORAL | 3 refills | Status: DC

## 2021-05-27 MED ORDER — OXYCODONE HCL 10 MG PO TABS: 10.0000 mg | ORAL_TABLET | Freq: Four times a day (QID) | ORAL | 0 refills | Status: DC | PRN

## 2021-05-27 NOTE — Progress Notes (Signed)
   [facility] [address] [place], [place]  [address] Phone:  [phone]   Fax:  [phone] Virtual Visit via Telephone Note  I connected with Lucio Edward Worthing on 05/27/21 at  3:20 PM EDT by telephone and verified that I am speaking with the correct person using two identifiers.   I discussed the limitations, risks, security and privacy concerns of performing an evaluation and management service by telephone and the availability of in person appointments. I also discussed with the patient that there may be a patient responsible charge related to this service. The patient expressed understanding and agreed to proceed.  Patient home Provider Office  History of Present Illness:  GABRIELA GIANNELLI  has a past medical history of Acute maxillary sinusitis, Eczema, and Sickle cell anemia (HCC) (.).   He states that he has been doing "pretty good". Denies fever, headache, cough, wheezing, shortness of breath, chest pains, abdominal pain, back pain, hip pain, or leg pain. Denies any open wounds, skin irritation.   He had stated back to work post foor surgery however was taken out of work again by [employer].   ROS   Observations/Objective: No exam; telephone visit  Assessment and Plan: 1. Sickle cell anemia without crisis (HCC) Ensure adequate hydration. Move frequently to reduce venous thromboembolism risk. Avoid situations that could lead to dehydration or could exacerbate pain Discussed S&S of infection, seizures, stroke acute chest, DVT and how important it is to seek medical attention Take medication as directed along with pain contract and overall compliance Discussed the risk related to opiate use (addition, tolerance and dependency)  2. Chronic, continuous use of opioids - Oxycodone HCl 10 MG TABS; Take 1 tablet (10 mg total) by mouth 4 (four) times daily as needed for up to 15 days.  Dispense: 60 tablet; Refill: 0  3. Chronic pain syndrome - Oxycodone  HCl 10 MG TABS; Take 1 tablet (10 mg total) by mouth 4 (four) times daily as needed for up to 15 days.  Dispense: 60 tablet; Refill: 0  4. Vitamin D deficiency - ergocalciferol (VITAMIN D2) 1.25 MG (50000 UT) capsule; Take 1 capsule (50,000 Units total) by mouth once a week. X 12 weeks.  Dispense: 12 capsule; Refill: 3   Follow Up Instructions: 3 months    I discussed the assessment and treatment plan with the patient. The patient was provided an opportunity to ask questions and all were answered. The patient agreed with the plan and demonstrated an understanding of the instructions.   The patient was advised to call back or seek an in-person evaluation if the symptoms worsen or if the condition fails to improve as anticipated.  I provided 9 minutes of telephone- visit time during this encounter.   Barbette Merino, NP

## 2021-05-27 NOTE — Patient Instructions (Signed)
Sickle Cell Anemia, Adult ?Sickle cell anemia is a condition where your red blood cells are shaped like sickles. Red blood cells carry oxygen through the body. Sickle-shaped cells do not live as long as normal red blood cells. They also clump together and block blood from flowing through the blood vessels. This prevents the body from getting enough oxygen. Sickle cell anemia causes organ damage and pain. It also increases the risk of infection. ?Follow these instructions at home: ?Medicines ?Take over-the-counter and prescription medicines only as told by your doctor. ?If you were prescribed an antibiotic medicine, take it as told by your doctor. Do not stop taking the antibiotic even if you start to feel better. ?If you develop a fever, do not take medicines to lower the fever right away. Tell your doctor about the fever. ?Managing pain, stiffness, and swelling ?Try these methods to help with pain: ?Use a heating pad. ?Take a warm bath. ?Distract yourself, such as by watching TV. ?Eating and drinking ?Drink enough fluid to keep your pee (urine) clear or pale yellow. Drink more in hot weather and during exercise. ?Limit or avoid alcohol. ?Eat a healthy diet. Eat plenty of fruits, vegetables, whole grains, and lean protein. ?Take vitamins and supplements as told by your doctor. ?Traveling ?When traveling, keep these with you: ?Your medical information. ?The names of your doctors. ?Your medicines. ?If you need to take an airplane, talk to your doctor first. ?Activity ?Rest often. ?Avoid exercises that make your heart beat much faster, such as jogging. ?General instructions ?Do not use products that have nicotine or tobacco, such as cigarettes and e-cigarettes. If you need help quitting, ask your doctor. ?Consider wearing a medical alert bracelet. ?Avoid being in high places (high altitudes), such as mountains. ?Avoid very hot or cold temperatures. ?Avoid places where the temperature changes a lot. ?Keep all follow-up  visits as told by your doctor. This is important. ?Contact a doctor if: ?A joint hurts. ?Your feet or hands hurt or swell. ?You feel tired (fatigued). ?Get help right away if: ?You have symptoms of infection. These include: ?Fever. ?Chills. ?Being very tired. ?Irritability. ?Poor eating. ?Throwing up (vomiting). ?You feel dizzy or faint. ?You have new stomach pain, especially on the left side. ?You have a an erection (priapism) that lasts more than 4 hours. ?You have numbness in your arms or legs. ?You have a hard time moving your arms or legs. ?You have trouble talking. ?You have pain that does not go away when you take medicine. ?You are short of breath. ?You are breathing fast. ?You have a long-term cough. ?You have pain in your chest. ?You have a bad headache. ?You have a stiff neck. ?Your stomach looks bloated even though you did not eat much. ?Your skin is pale. ?You suddenly cannot see well. ?Summary ?Sickle cell anemia is a condition where your red blood cells are shaped like sickles. ?Follow your doctor's advice on ways to manage pain, food to eat, activities to do, and steps to take for safe travel. ?Get medical help right away if you have any signs of infection, such as a fever. ?This information is not intended to replace advice given to you by your health care provider. Make sure you discuss any questions you have with your health care provider. ?Document Revised: 12/06/2019 Document Reviewed: 12/06/2019 ?Elsevier Patient Education ? 2022 [employer] ? ?

## 2021-05-28 ENCOUNTER — Encounter: Payer: Self-pay | Admitting: Nurse Practitioner

## 2021-06-04 ENCOUNTER — Other Ambulatory Visit: Payer: Self-pay

## 2021-06-04 DIAGNOSIS — G894 Chronic pain syndrome: Secondary | ICD-10-CM

## 2021-06-04 DIAGNOSIS — F1190 Opioid use, unspecified, uncomplicated: Secondary | ICD-10-CM

## 2021-06-22 ENCOUNTER — Other Ambulatory Visit: Payer: Self-pay | Admitting: Nurse Practitioner

## 2021-06-22 DIAGNOSIS — G894 Chronic pain syndrome: Secondary | ICD-10-CM

## 2021-06-22 DIAGNOSIS — F1190 Opioid use, unspecified, uncomplicated: Secondary | ICD-10-CM

## 2021-06-22 MED ORDER — OXYCODONE HCL 10 MG PO TABS: 10.0000 mg | ORAL_TABLET | Freq: Four times a day (QID) | ORAL | 0 refills | Status: DC | PRN

## 2021-06-26 ENCOUNTER — Encounter (HOSPITAL_COMMUNITY): Payer: Self-pay

## 2021-06-26 ENCOUNTER — Other Ambulatory Visit: Payer: Self-pay

## 2021-06-26 ENCOUNTER — Emergency Department (HOSPITAL_COMMUNITY)
Admission: EM | Admit: 2021-06-26 | Discharge: 2021-06-26 | Disposition: A | Discharge status: Home / Self Care | Payer: 59 | Attending: Emergency Medicine | Admitting: Emergency Medicine

## 2021-06-26 DIAGNOSIS — Z87891 Personal history of nicotine dependence: Secondary | ICD-10-CM | POA: Insufficient documentation

## 2021-06-26 DIAGNOSIS — M25561 Pain in right knee: Secondary | ICD-10-CM | POA: Diagnosis present

## 2021-06-26 DIAGNOSIS — Z20822 Contact with and (suspected) exposure to covid-19: Secondary | ICD-10-CM | POA: Insufficient documentation

## 2021-06-26 DIAGNOSIS — D5700 Hb-SS disease with crisis, unspecified: Secondary | ICD-10-CM | POA: Diagnosis not present

## 2021-06-26 LAB — CBC WITH DIFFERENTIAL/PLATELET
Abs Immature Granulocytes: 0.05 10*3/uL (ref 0.00–0.07)
Basophils Absolute: 0 10*3/uL (ref 0.0–0.1)
Basophils Relative: 0 %
Eosinophils Absolute: 0.1 10*3/uL (ref 0.0–0.5)
Eosinophils Relative: 1 %
HCT: 38.4 % — ABNORMAL LOW (ref 39.0–52.0)
Hemoglobin: 14 g/dL (ref 13.0–17.0)
Immature Granulocytes: 1 %
Lymphocytes Relative: 23 %
Lymphs Abs: 2.2 10*3/uL (ref 0.7–4.0)
MCH: 29 pg (ref 26.0–34.0)
MCHC: 36.5 g/dL — ABNORMAL HIGH (ref 30.0–36.0)
MCV: 79.7 fL — ABNORMAL LOW (ref 80.0–100.0)
Monocytes Absolute: 1 10*3/uL (ref 0.1–1.0)
Monocytes Relative: 10 %
Neutro Abs: 6.2 10*3/uL (ref 1.7–7.7)
Neutrophils Relative %: 65 %
Platelets: 156 10*3/uL (ref 150–400)
RBC: 4.82 MIL/uL (ref 4.22–5.81)
RDW: 14.8 % (ref 11.5–15.5)
WBC: 9.6 10*3/uL (ref 4.0–10.5)
nRBC: 0.5 % — ABNORMAL HIGH (ref 0.0–0.2)

## 2021-06-26 LAB — RESP PANEL BY RT-PCR (FLU A&B, COVID) ARPGX2
Influenza A by PCR: NEGATIVE
Influenza B by PCR: NEGATIVE
SARS Coronavirus 2 by RT PCR: NEGATIVE

## 2021-06-26 LAB — COMPREHENSIVE METABOLIC PANEL
ALT: 34 U/L (ref 0–44)
AST: 13 U/L — ABNORMAL LOW (ref 15–41)
Albumin: 5 g/dL (ref 3.5–5.0)
Alkaline Phosphatase: 55 U/L (ref 38–126)
Anion gap: 10 (ref 5–15)
BUN: 9 mg/dL (ref 6–20)
CO2: 25 mmol/L (ref 22–32)
Calcium: 9.4 mg/dL (ref 8.9–10.3)
Chloride: 99 mmol/L (ref 98–111)
Creatinine, Ser: 0.79 mg/dL (ref 0.61–1.24)
GFR, Estimated: >60 mL/min (ref >60)
Glucose, Bld: 94 mg/dL (ref 70–99)
Potassium: 4.2 mmol/L (ref 3.5–5.1)
Sodium: 134 mmol/L — ABNORMAL LOW (ref 135–145)
Total Bilirubin: 1.5 mg/dL — ABNORMAL HIGH (ref 0.3–1.2)
Total Protein: 8.6 g/dL — ABNORMAL HIGH (ref 6.5–8.1)

## 2021-06-26 LAB — RETICULOCYTES
Immature Retic Fract: 29.1 % — ABNORMAL HIGH (ref 2.3–15.9)
RBC.: 4.86 MIL/uL (ref 4.22–5.81)
Retic Count, Absolute: 196.3 10*3/uL — ABNORMAL HIGH (ref 19.0–186.0)
Retic Ct Pct: 4 % — ABNORMAL HIGH (ref 0.4–3.1)

## 2021-06-26 MED ORDER — DIPHENHYDRAMINE HCL 50 MG/ML IJ SOLN
25.0000 mg | Freq: Once | INTRAMUSCULAR | Status: AC
  Administered 2021-06-26: 25 mg via INTRAVENOUS
  Filled 2021-06-26: qty 1

## 2021-06-26 MED ORDER — SODIUM CHLORIDE 0.45 % IV SOLN: INTRAVENOUS | Status: DC

## 2021-06-26 MED ORDER — HYDROMORPHONE HCL 2 MG/ML IJ SOLN
2.0000 mg | Freq: Once | INTRAMUSCULAR | Status: AC
  Administered 2021-06-26: 2 mg via INTRAVENOUS
  Filled 2021-06-26: qty 1

## 2021-06-26 MED ORDER — KETOROLAC TROMETHAMINE 15 MG/ML IJ SOLN
15.0000 mg | INTRAMUSCULAR | Status: AC
  Administered 2021-06-26: 15 mg via INTRAVENOUS
  Filled 2021-06-26: qty 1

## 2021-06-26 MED ORDER — HYDROMORPHONE HCL 2 MG/ML IJ SOLN
2.0000 mg | INTRAMUSCULAR | Status: AC
  Administered 2021-06-26: 2 mg via INTRAVENOUS
  Filled 2021-06-26: qty 1

## 2021-06-26 MED ORDER — ONDANSETRON HCL 4 MG/2ML IJ SOLN
4.0000 mg | INTRAMUSCULAR | Status: DC | PRN
  Administered 2021-06-26: 4 mg via INTRAVENOUS
  Filled 2021-06-26: qty 2

## 2021-06-26 NOTE — ED Provider Notes (Signed)
[facility]-EMERGENCY DEPT Provider Note   CSN: [id number] Arrival date & time: 06/26/21  0809     History Chief Complaint  Patient presents with   Sickle Cell Pain Crisis    Austin [name] is a 30 y.o. male.  Pt presents to the ED today with sickle cell pain.  Pt has pain in his back and in both knees.  He denies any f/c.  No swelling in his knees.  No trauma. He's been taking his meds, but they have not been helping.      Past Medical History:  Diagnosis Date   Acute maxillary sinusitis    Eczema    Sickle cell anemia (HCC) .    Patient Active Problem List   Diagnosis Date Noted   Hypokalemia 03/05/2021   Anemia of chronic disease 03/05/2021   Vitamin D deficiency 11/16/2020   Bilateral lower extremity pain 10/17/2019   Sickle cell anemia without crisis (HCC) 09/20/2019   Chronic pain syndrome 09/20/2019   Nausea 09/20/2019   Chronic, continuous use of opioids 09/20/2019   Medication management 03/20/2019   Bilious vomiting without nausea    Viral gastroenteritis 01/23/2019   Nausea vomiting and diarrhea    Generalized abdominal pain    Hb-SS disease with crisis (HCC)    Dehydration    Sickle cell pain crisis (HCC) 05/18/2018   Acute sickle cell crisis (HCC) 12/15/2017   Thrombocytopenia (HCC) 12/15/2017   Splenomegaly 12/15/2017   Sickle cell anemia with crisis (HCC) 12/15/2017   Spleen enlarged 08/18/2017    Past Surgical History:  Procedure Laterality Date   FOOT SURGERY Left 12/25/2020   WISDOM TOOTH EXTRACTION         Family History  Problem Relation Age of Onset   Sickle cell trait Mother    Diabetes Father    Hypertension Father     Social History   Tobacco Use   Smoking status: Former    Packs/day: 0.10    Types: Cigarettes   Smokeless tobacco: Never  Vaping Use   Vaping Use: Never used  Substance Use Topics   Alcohol use: Not Currently   Drug use: Yes    Types: Marijuana    Comment: occasionally     Home Medications Prior to Admission medications   Medication Sig Start Date End Date Taking? Authorizing Provider  dicyclomine (BENTYL) 20 MG tablet Take 1 tablet (20 mg total) by mouth 2 (two) times daily. 03/23/21 03/23/22  Barbette Merino, NP  ergocalciferol (VITAMIN D2) 1.25 MG (50000 UT) capsule Take 1 capsule (50,000 Units total) by mouth once a week. X 12 weeks. 05/27/21 05/27/22  Barbette Merino, NP  loratadine (CLARITIN) 10 MG tablet Take 1 tablet (10 mg total) by mouth daily. 11/13/20   Barbette Merino, NP  Olopatadine HCl 0.2 % SOLN Apply 1 drop to eye in the morning and at bedtime. Patient not taking: No sig reported 11/13/20 11/13/21  Barbette Merino, NP  ondansetron (ZOFRAN-ODT) 4 MG disintegrating tablet Take 4 mg by mouth every 8 (eight) hours as needed for nausea/vomiting. 04/30/21   [provider]  Oxycodone HCl 10 MG TABS Take 1 tablet (10 mg total) by mouth 4 (four) times daily as needed for up to 15 days. 06/22/21 07/07/21  Barbette Merino, NP  triamcinolone (NASACORT) 55 MCG/ACT AERO nasal inhaler Place 2 sprays into the nose daily. 2 sprays each nostril at night 04/27/21   Drema Halon, MD    Allergies  Patient has no known allergies.  Review of Systems   Review of Systems  Musculoskeletal:  Positive for back pain.       Bilateral knee pain  All other systems reviewed and are negative.  Physical Exam Updated Vital Signs BP 120/87   Pulse 78   Temp (!) 97.5 F (36.4 C) (Oral)   Resp 16   Ht 5\' 7"  (1.702 m)   Wt 87.1 kg   SpO2 98%   BMI 30.07 kg/m   Physical Exam Vitals and nursing note reviewed.  Constitutional:      Appearance: Normal appearance.  HENT:     Head: Normocephalic and atraumatic.     Right Ear: External ear normal.     Left Ear: External ear normal.     Nose: Nose normal.     Mouth/Throat:     Mouth: Mucous membranes are moist.     Pharynx: Oropharynx is clear.  Eyes:     Extraocular Movements: Extraocular movements  intact.     Conjunctiva/sclera: Conjunctivae normal.     Pupils: Pupils are equal, round, and reactive to light.  Cardiovascular:     Rate and Rhythm: Normal rate and regular rhythm.     Pulses: Normal pulses.     Heart sounds: Normal heart sounds.  Pulmonary:     Effort: Pulmonary effort is normal.     Breath sounds: Normal breath sounds.  Abdominal:     General: Abdomen is flat. Bowel sounds are normal.  Musculoskeletal:        General: Normal range of motion.     Cervical back: Normal range of motion and neck supple.  Skin:    General: Skin is warm.     Capillary Refill: Capillary refill takes less than 2 seconds.  Neurological:     General: No focal deficit present.     Mental Status: He is alert and oriented to person, place, and time.  Psychiatric:        Mood and Affect: Mood normal.        Behavior: Behavior normal.    ED Results / Procedures / Treatments   Labs (all labs ordered are listed, but only abnormal results are displayed) Labs Reviewed  COMPREHENSIVE METABOLIC PANEL - Abnormal; Notable for the following components:      Result Value   Sodium 134 (*)    Total Protein 8.6 (*)    AST 13 (*)    Total Bilirubin 1.5 (*)    All other components within normal limits  CBC WITH DIFFERENTIAL/PLATELET - Abnormal; Notable for the following components:   HCT 38.4 (*)    MCV 79.7 (*)    MCHC 36.5 (*)    nRBC 0.5 (*)    All other components within normal limits  RETICULOCYTES - Abnormal; Notable for the following components:   Retic Ct Pct 4.0 (*)    Retic Count, Absolute 196.3 (*)    Immature Retic Fract 29.1 (*)    All other components within normal limits  RESP PANEL BY RT-PCR (FLU A&B, COVID) ARPGX2    EKG None  Radiology No results found.  Procedures Procedures   Medications Ordered in ED Medications  0.45 % sodium chloride infusion ( Intravenous New Bag/Given 06/26/21 0851)  ondansetron (ZOFRAN) injection 4 mg (4 mg Intravenous Given 06/26/21 0851)   diphenhydrAMINE (BENADRYL) injection 25 mg (has no administration in time range)  HYDROmorphone (DILAUDID) injection 2 mg (has no administration in time range)  ketorolac (TORADOL) 15 MG/ML  injection 15 mg (15 mg Intravenous Given 06/26/21 0853)  HYDROmorphone (DILAUDID) injection 2 mg (2 mg Intravenous Given 06/26/21 0856)  HYDROmorphone (DILAUDID) injection 2 mg (2 mg Intravenous Given 06/26/21 0942)  diphenhydrAMINE (BENADRYL) injection 25 mg (25 mg Intravenous Given 06/26/21 0855)    ED Course  I have reviewed the triage vital signs and the nursing notes.  Pertinent labs & imaging results that were available during my care of the patient were reviewed by me and considered in my medical decision making (see chart for details).    MDM Rules/Calculators/A&P                           Pt is feeling much better.  Labs unremarkable for anything acute. He requests 3rd dose and then d/c.   Final Clinical Impression(s) / ED Diagnoses Final diagnoses:  Sickle cell pain crisis [facility])    Rx / DC Orders ED Discharge Orders     None        Jacalyn Lefevre, MD 06/26/21 [phone]

## 2021-06-26 NOTE — ED Notes (Signed)
Attempted labs but Pt expressed need for IV [place]

## 2021-06-26 NOTE — ED Triage Notes (Signed)
Patient c/o sickle cell pain in the lower back and bilateral knees x 2 days.

## 2021-07-02 ENCOUNTER — Other Ambulatory Visit: Payer: Self-pay | Admitting: Nurse Practitioner

## 2021-07-06 ENCOUNTER — Other Ambulatory Visit: Payer: Self-pay

## 2021-07-06 ENCOUNTER — Ambulatory Visit (INDEPENDENT_AMBULATORY_CARE_PROVIDER_SITE_OTHER): Payer: 59 | Admitting: Podiatry

## 2021-07-06 ENCOUNTER — Encounter: Payer: Self-pay | Admitting: Podiatry

## 2021-07-06 DIAGNOSIS — M722 Plantar fascial fibromatosis: Secondary | ICD-10-CM

## 2021-07-06 NOTE — Progress Notes (Signed)
   Subjective:  Patient presents today status post EPF left. DOS: 09/14/2020.  Patient states that he is doing very well.  He has had steady improvement on his feet.  No new complaints at this time  Past Medical History:  Diagnosis Date   Acute maxillary sinusitis    Eczema    Sickle cell anemia (HCC) .      Objective/Physical Exam Neurovascular status intact.  Skin incisions appear to be well coapted and healed.  Negative for any significant tenderness or pain on palpation.  Full range of motion.  Muscle strength 5/5 all compartments.   Assessment: 1. s/p EPF LT.  DOS: 12/25/2020   Plan of Care:  1. Patient was evaluated. 2.  Patient is doing very well.  He states that he is ready to return to work. 3.  Patient may resume full activity no restrictions.  Return to work 07/07/2021 4.  Return to clinic as needed  Felecia Shelling, DPM [facility]  Dr. Felecia Shelling, DPM    [address] [place], [place] [address]                Office [phone]  Fax [phone]

## 2021-07-10 ENCOUNTER — Other Ambulatory Visit: Payer: Self-pay | Admitting: Nurse Practitioner

## 2021-07-10 DIAGNOSIS — F1190 Opioid use, unspecified, uncomplicated: Secondary | ICD-10-CM

## 2021-07-10 DIAGNOSIS — G894 Chronic pain syndrome: Secondary | ICD-10-CM

## 2021-07-10 MED ORDER — OXYCODONE HCL 10 MG PO TABS: 10.0000 mg | ORAL_TABLET | Freq: Four times a day (QID) | ORAL | 0 refills | Status: DC | PRN

## 2021-07-30 ENCOUNTER — Emergency Department (HOSPITAL_COMMUNITY)
Admission: EM | Admit: 2021-07-30 | Discharge: 2021-07-30 | Disposition: A | Discharge status: Home / Self Care | Payer: 59 | Attending: Emergency Medicine | Admitting: Emergency Medicine

## 2021-07-30 ENCOUNTER — Other Ambulatory Visit: Payer: Self-pay

## 2021-07-30 ENCOUNTER — Encounter (HOSPITAL_COMMUNITY): Payer: Self-pay

## 2021-07-30 DIAGNOSIS — D5700 Hb-SS disease with crisis, unspecified: Secondary | ICD-10-CM | POA: Insufficient documentation

## 2021-07-30 LAB — RETICULOCYTES
Immature Retic Fract: 30.5 % — ABNORMAL HIGH (ref 2.3–15.9)
RBC.: 4.76 MIL/uL (ref 4.22–5.81)
Retic Count, Absolute: 173.3 10*3/uL (ref 19.0–186.0)
Retic Ct Pct: 3.6 % — ABNORMAL HIGH (ref 0.4–3.1)

## 2021-07-30 LAB — CBC WITH DIFFERENTIAL/PLATELET
Abs Immature Granulocytes: 0.03 10*3/uL (ref 0.00–0.07)
Basophils Absolute: 0 10*3/uL (ref 0.0–0.1)
Basophils Relative: 0 %
Eosinophils Absolute: 0.1 10*3/uL (ref 0.0–0.5)
Eosinophils Relative: 1 %
HCT: 37.3 % — ABNORMAL LOW (ref 39.0–52.0)
Hemoglobin: 13.5 g/dL (ref 13.0–17.0)
Immature Granulocytes: 0 %
Lymphocytes Relative: 19 %
Lymphs Abs: 2.1 10*3/uL (ref 0.7–4.0)
MCH: 28.8 pg (ref 26.0–34.0)
MCHC: 36.2 g/dL — ABNORMAL HIGH (ref 30.0–36.0)
MCV: 79.5 fL — ABNORMAL LOW (ref 80.0–100.0)
Monocytes Absolute: 0.8 10*3/uL (ref 0.1–1.0)
Monocytes Relative: 7 %
Neutro Abs: 7.9 10*3/uL — ABNORMAL HIGH (ref 1.7–7.7)
Neutrophils Relative %: 73 %
Platelets: 163 10*3/uL (ref 150–400)
RBC: 4.69 MIL/uL (ref 4.22–5.81)
RDW: 14 % (ref 11.5–15.5)
WBC: 10.9 10*3/uL — ABNORMAL HIGH (ref 4.0–10.5)
nRBC: 0.3 % — ABNORMAL HIGH (ref 0.0–0.2)

## 2021-07-30 LAB — COMPREHENSIVE METABOLIC PANEL
ALT: 22 U/L (ref 0–44)
AST: 12 U/L — ABNORMAL LOW (ref 15–41)
Albumin: 4.5 g/dL (ref 3.5–5.0)
Alkaline Phosphatase: 49 U/L (ref 38–126)
Anion gap: 9 (ref 5–15)
BUN: 11 mg/dL (ref 6–20)
CO2: 26 mmol/L (ref 22–32)
Calcium: 9.6 mg/dL (ref 8.9–10.3)
Chloride: 103 mmol/L (ref 98–111)
Creatinine, Ser: 0.92 mg/dL (ref 0.61–1.24)
GFR, Estimated: >60 mL/min (ref >60)
Glucose, Bld: 129 mg/dL — ABNORMAL HIGH (ref 70–99)
Potassium: 4 mmol/L (ref 3.5–5.1)
Sodium: 138 mmol/L (ref 135–145)
Total Bilirubin: 1.6 mg/dL — ABNORMAL HIGH (ref 0.3–1.2)
Total Protein: 7.7 g/dL (ref 6.5–8.1)

## 2021-07-30 MED ORDER — HYDROMORPHONE HCL 2 MG/ML IJ SOLN
2.0000 mg | INTRAMUSCULAR | Status: AC
  Administered 2021-07-30: 2 mg via SUBCUTANEOUS
  Filled 2021-07-30: qty 1

## 2021-07-30 MED ORDER — ONDANSETRON 8 MG PO TBDP
8.0000 mg | ORAL_TABLET | Freq: Three times a day (TID) | ORAL | Status: DC | PRN
  Administered 2021-07-30: 8 mg via ORAL
  Filled 2021-07-30: qty 1

## 2021-07-30 MED ORDER — DIPHENHYDRAMINE HCL 25 MG PO CAPS
25.0000 mg | ORAL_CAPSULE | ORAL | Status: DC | PRN
  Administered 2021-07-30: 25 mg via ORAL
  Filled 2021-07-30: qty 1

## 2021-07-30 MED ORDER — HYDROMORPHONE HCL 2 MG/ML IJ SOLN
2.0000 mg | INTRAMUSCULAR | Status: AC
  Administered 2021-07-30: 2 mg via SUBCUTANEOUS
  Filled 2021-07-30 (×2): qty 1

## 2021-07-30 NOTE — ED Notes (Signed)
Pt ambulatory from ED waiting room to ED exam room 10.

## 2021-07-30 NOTE — ED Notes (Signed)
Pt ambulatory to the bathroom at this time.

## 2021-07-30 NOTE — ED Notes (Signed)
Pt declines second dose of subcu Dilaudid 2mg , states pain is a "6/10" and would like to "wait" on second dose of pain medication at this time.

## 2021-07-30 NOTE — ED Provider Notes (Signed)
[facility]-EMERGENCY DEPT Provider Note   CSN: [id number] Arrival date & time: 07/30/21  0800     History  Chief Complaint  Patient presents with   Sickle Cell Pain Crisis    Austin [name] is a 31 y.o. male.  31 year old male with history of sickle cell disease presents with pain crisis localized to his back.  Denies any cough or congestion.  No urinary symptoms.  Patient states that he has been out of his chronic opiates and that he feels that this is what exacerbated this.  Denies any new injuries.  No radicular symptoms.  Pain characterizes sharp and worse with movements.      Home Medications Prior to Admission medications   Medication Sig Start Date End Date Taking? Authorizing Provider  dicyclomine (BENTYL) 20 MG tablet Take 1 tablet (20 mg total) by mouth 2 (two) times daily. 03/23/21 03/23/22  Barbette Merino, NP  ergocalciferol (VITAMIN D2) 1.25 MG (50000 UT) capsule Take 1 capsule (50,000 Units total) by mouth once a week. X 12 weeks. 05/27/21 05/27/22  Barbette Merino, NP  loratadine (CLARITIN) 10 MG tablet Take 1 tablet (10 mg total) by mouth daily. 11/13/20   Barbette Merino, NP  Olopatadine HCl 0.2 % SOLN Apply 1 drop to eye in the morning and at bedtime. Patient not taking: No sig reported 11/13/20 11/13/21  Barbette Merino, NP  ondansetron (ZOFRAN-ODT) 4 MG disintegrating tablet TAKE 1 TABLET BY MOUTH EVERY 8 HOURS AS NEEDED FOR UP TO 15 DAYS FOR NAUSEA OR VOMITING. 07/03/21   Barbette Merino, NP  triamcinolone (NASACORT) 55 MCG/ACT AERO nasal inhaler Place 2 sprays into the nose daily. 2 sprays each nostril at night 04/27/21   Drema Halon, MD      Allergies    Patient has no known allergies.    Review of Systems   Review of Systems  All other systems reviewed and are negative.  Physical Exam Updated Vital Signs BP 122/81    Pulse 68    Temp 98 F (36.7 C) (Oral)    Resp 18    Ht 1.727 m (5\' 8" )    Wt 86.6 kg    SpO2 100%    BMI  29.04 kg/m  Physical Exam Vitals and nursing note reviewed.  Constitutional:      General: He is not in acute distress.    Appearance: Normal appearance. He is well-developed. He is not toxic-appearing.  HENT:     Head: Normocephalic and atraumatic.  Eyes:     General: Lids are normal.     Conjunctiva/sclera: Conjunctivae normal.     Pupils: Pupils are equal, round, and reactive to light.  Neck:     Thyroid: No thyroid mass.     Trachea: No tracheal deviation.  Cardiovascular:     Rate and Rhythm: Normal rate and regular rhythm.     Heart sounds: Normal heart sounds. No murmur heard.   No gallop.  Pulmonary:     Effort: Pulmonary effort is normal. No respiratory distress.     Breath sounds: Normal breath sounds. No stridor. No decreased breath sounds, wheezing, rhonchi or rales.  Abdominal:     General: There is no distension.     Palpations: Abdomen is soft.     Tenderness: There is no abdominal tenderness. There is no rebound.  Musculoskeletal:        General: No tenderness. Normal range of motion.     Cervical back:  Normal range of motion and neck supple.       Back:  Skin:    General: Skin is warm and dry.     Findings: No abrasion or rash.  Neurological:     Mental Status: He is alert and oriented to person, place, and time. Mental status is at baseline.     GCS: GCS eye subscore is 4. GCS verbal subscore is 5. GCS motor subscore is 6.     Cranial Nerves: No cranial nerve deficit.     Sensory: No sensory deficit.     Motor: Motor function is intact.  Psychiatric:        Attention and Perception: Attention normal.        Speech: Speech normal.        Behavior: Behavior normal.    ED Results / Procedures / Treatments   Labs (all labs ordered are listed, but only abnormal results are displayed) Labs Reviewed  COMPREHENSIVE METABOLIC PANEL - Abnormal; Notable for the following components:      Result Value   Glucose, Bld 129 (*)    AST 12 (*)    Total Bilirubin  1.6 (*)    All other components within normal limits  CBC WITH DIFFERENTIAL/PLATELET - Abnormal; Notable for the following components:   WBC 10.9 (*)    HCT 37.3 (*)    MCV 79.5 (*)    MCHC 36.2 (*)    nRBC 0.3 (*)    Neutro Abs 7.9 (*)    All other components within normal limits  RETICULOCYTES - Abnormal; Notable for the following components:   Retic Ct Pct 3.6 (*)    Immature Retic Fract 30.5 (*)    All other components within normal limits    EKG None  Radiology No results found.  Procedures Procedures    Medications Ordered in ED Medications  HYDROmorphone (DILAUDID) injection 2 mg (has no administration in time range)  HYDROmorphone (DILAUDID) injection 2 mg (has no administration in time range)  ondansetron (ZOFRAN-ODT) disintegrating tablet 8 mg (has no administration in time range)  diphenhydrAMINE (BENADRYL) capsule 25-50 mg (has no administration in time range)    ED Course/ Medical Decision Making/ A&P                           Medical Decision Making Patient treated for likely chronic pain exacerbation versus sickle cell crisis with subcu hydromorphone x2.  Patient has no signs of acute chest or other infectious etiologies.  Labs here were reviewed and are reassuring.  Patient has run out of his chronic opiates and was strongly encouraged to follow-up in the clinic to have them refilled.  He does not require admission at this time           Final Clinical Impression(s) / ED Diagnoses Final diagnoses:  None    Rx / DC Orders ED Discharge Orders     None         Lorre Nick, MD 07/30/21 1411

## 2021-07-30 NOTE — ED Triage Notes (Signed)
Patient c/o sickle cell pain in his lower back x 3 days. Patient  denies SOB and chest pain.

## 2021-07-30 NOTE — ED Notes (Addendum)
This nurse attempted to stick pt in left posterior hand and pt shouted, pulling hand away from nurse, leaving IV needle exposed. Pt stated that he is a hard stick and the [place] would need to be used. IV team consult ordered at this time. EDP notified and aware.

## 2021-07-30 NOTE — ED Notes (Addendum)
Subcu medications to be given at this time, per EDP's written order.

## 2021-07-31 ENCOUNTER — Observation Stay (HOSPITAL_COMMUNITY)
Admission: EM | Admit: 2021-07-31 | Discharge: 2021-08-01 | Disposition: A | Discharge status: Home / Self Care | Payer: 59 | Attending: Internal Medicine | Admitting: Internal Medicine

## 2021-07-31 ENCOUNTER — Other Ambulatory Visit: Payer: Self-pay

## 2021-07-31 ENCOUNTER — Encounter (HOSPITAL_COMMUNITY): Payer: Self-pay

## 2021-07-31 DIAGNOSIS — D72829 Elevated white blood cell count, unspecified: Secondary | ICD-10-CM | POA: Diagnosis not present

## 2021-07-31 DIAGNOSIS — D5700 Hb-SS disease with crisis, unspecified: Secondary | ICD-10-CM | POA: Diagnosis not present

## 2021-07-31 DIAGNOSIS — R110 Nausea: Secondary | ICD-10-CM | POA: Diagnosis not present

## 2021-07-31 DIAGNOSIS — Z20822 Contact with and (suspected) exposure to covid-19: Secondary | ICD-10-CM | POA: Diagnosis not present

## 2021-07-31 DIAGNOSIS — R9431 Abnormal electrocardiogram [ECG] [EKG]: Secondary | ICD-10-CM | POA: Diagnosis not present

## 2021-07-31 LAB — CBC WITH DIFFERENTIAL/PLATELET
Abs Immature Granulocytes: 0.07 10*3/uL (ref 0.00–0.07)
Basophils Absolute: 0 10*3/uL (ref 0.0–0.1)
Basophils Relative: 0 %
Eosinophils Absolute: 0 10*3/uL (ref 0.0–0.5)
Eosinophils Relative: 0 %
HCT: 39.3 % (ref 39.0–52.0)
Hemoglobin: 14.1 g/dL (ref 13.0–17.0)
Immature Granulocytes: 0 %
Lymphocytes Relative: 5 %
Lymphs Abs: 0.9 10*3/uL (ref 0.7–4.0)
MCH: 28.1 pg (ref 26.0–34.0)
MCHC: 35.9 g/dL (ref 30.0–36.0)
MCV: 78.3 fL — ABNORMAL LOW (ref 80.0–100.0)
Monocytes Absolute: 0.6 10*3/uL (ref 0.1–1.0)
Monocytes Relative: 3 %
Neutro Abs: 16 10*3/uL — ABNORMAL HIGH (ref 1.7–7.7)
Neutrophils Relative %: 92 %
Platelets: 166 10*3/uL (ref 150–400)
RBC: 5.02 MIL/uL (ref 4.22–5.81)
RDW: 14.2 % (ref 11.5–15.5)
WBC: 17.6 10*3/uL — ABNORMAL HIGH (ref 4.0–10.5)
nRBC: 0.1 % (ref 0.0–0.2)

## 2021-07-31 LAB — LIPASE, BLOOD: Lipase: 21 U/L (ref 11–51)

## 2021-07-31 LAB — RESP PANEL BY RT-PCR (FLU A&B, COVID) ARPGX2
Influenza A by PCR: NEGATIVE
Influenza B by PCR: NEGATIVE
SARS Coronavirus 2 by RT PCR: NEGATIVE

## 2021-07-31 LAB — RETICULOCYTES
Immature Retic Fract: 26.1 % — ABNORMAL HIGH (ref 2.3–15.9)
RBC.: 4.98 MIL/uL (ref 4.22–5.81)
Retic Count, Absolute: 183.3 10*3/uL (ref 19.0–186.0)
Retic Ct Pct: 3.7 % — ABNORMAL HIGH (ref 0.4–3.1)

## 2021-07-31 LAB — HEPATIC FUNCTION PANEL
ALT: 23 U/L (ref 0–44)
AST: 12 U/L — ABNORMAL LOW (ref 15–41)
Albumin: 5.1 g/dL — ABNORMAL HIGH (ref 3.5–5.0)
Alkaline Phosphatase: 53 U/L (ref 38–126)
Bilirubin, Direct: 0.4 mg/dL — ABNORMAL HIGH (ref 0.0–0.2)
Indirect Bilirubin: 1.4 mg/dL — ABNORMAL HIGH (ref 0.3–0.9)
Total Bilirubin: 1.8 mg/dL — ABNORMAL HIGH (ref 0.3–1.2)
Total Protein: 9 g/dL — ABNORMAL HIGH (ref 6.5–8.1)

## 2021-07-31 LAB — BASIC METABOLIC PANEL
Anion gap: 10 (ref 5–15)
BUN: 10 mg/dL (ref 6–20)
CO2: 26 mmol/L (ref 22–32)
Calcium: 10 mg/dL (ref 8.9–10.3)
Chloride: 100 mmol/L (ref 98–111)
Creatinine, Ser: 0.94 mg/dL (ref 0.61–1.24)
GFR, Estimated: >60 mL/min (ref >60)
Glucose, Bld: 138 mg/dL — ABNORMAL HIGH (ref 70–99)
Potassium: 3.9 mmol/L (ref 3.5–5.1)
Sodium: 136 mmol/L (ref 135–145)

## 2021-07-31 MED ORDER — ENOXAPARIN SODIUM 40 MG/0.4ML IJ SOSY: 40.0000 mg | PREFILLED_SYRINGE | INTRAMUSCULAR | Status: DC

## 2021-07-31 MED ORDER — DIPHENHYDRAMINE HCL 50 MG/ML IJ SOLN
25.0000 mg | Freq: Once | INTRAMUSCULAR | Status: AC
  Administered 2021-07-31: 25 mg via INTRAVENOUS
  Filled 2021-07-31: qty 1

## 2021-07-31 MED ORDER — SODIUM CHLORIDE 0.9 % IV SOLN
25.0000 mg | INTRAVENOUS | Status: DC | PRN
  Filled 2021-07-31: qty 0.5

## 2021-07-31 MED ORDER — SENNOSIDES-DOCUSATE SODIUM 8.6-50 MG PO TABS
1.0000 | ORAL_TABLET | Freq: Two times a day (BID) | ORAL | Status: DC
  Filled 2021-07-31: qty 1

## 2021-07-31 MED ORDER — HYDROMORPHONE HCL 1 MG/ML IJ SOLN
1.0000 mg | INTRAMUSCULAR | Status: DC | PRN
Start: 2021-07-31 — End: 2021-08-01
  Administered 2021-07-31 – 2021-08-01 (×4): 1 mg via INTRAVENOUS
  Filled 2021-07-31 (×4): qty 1

## 2021-07-31 MED ORDER — DEXTROSE-NACL 5-0.45 % IV SOLN: INTRAVENOUS | Status: DC

## 2021-07-31 MED ORDER — DIPHENHYDRAMINE HCL 25 MG PO CAPS: 25.0000 mg | ORAL_CAPSULE | ORAL | Status: DC | PRN

## 2021-07-31 MED ORDER — SODIUM CHLORIDE 0.9 % IV SOLN
12.5000 mg | Freq: Four times a day (QID) | INTRAVENOUS | Status: DC | PRN
  Administered 2021-07-31: 12.5 mg via INTRAVENOUS
  Filled 2021-07-31: qty 12.5

## 2021-07-31 MED ORDER — POLYETHYLENE GLYCOL 3350 17 G PO PACK: 17.0000 g | PACK | Freq: Every day | ORAL | Status: DC | PRN

## 2021-07-31 MED ORDER — ONDANSETRON HCL 4 MG/2ML IJ SOLN
4.0000 mg | INTRAMUSCULAR | Status: DC | PRN
  Administered 2021-08-01: 4 mg via INTRAVENOUS
  Filled 2021-07-31: qty 2

## 2021-07-31 MED ORDER — HYDROMORPHONE HCL 2 MG/ML IJ SOLN
2.0000 mg | INTRAMUSCULAR | Status: AC
  Administered 2021-07-31: 2 mg via INTRAVENOUS
  Filled 2021-07-31: qty 1

## 2021-07-31 MED ORDER — KETOROLAC TROMETHAMINE 15 MG/ML IJ SOLN
15.0000 mg | Freq: Four times a day (QID) | INTRAMUSCULAR | Status: DC
  Administered 2021-07-31 – 2021-08-01 (×3): 15 mg via INTRAVENOUS
  Filled 2021-07-31 (×3): qty 1

## 2021-07-31 MED ORDER — DICYCLOMINE HCL 20 MG PO TABS
20.0000 mg | ORAL_TABLET | Freq: Two times a day (BID) | ORAL | Status: DC
  Administered 2021-07-31 – 2021-08-01 (×2): 20 mg via ORAL
  Filled 2021-07-31 (×2): qty 1

## 2021-07-31 MED ORDER — ONDANSETRON HCL 4 MG PO TABS: 4.0000 mg | ORAL_TABLET | ORAL | Status: DC | PRN

## 2021-07-31 MED ORDER — ALUM & MAG HYDROXIDE-SIMETH 200-200-20 MG/5ML PO SUSP: 15.0000 mL | ORAL | Status: DC | PRN

## 2021-07-31 NOTE — ED Provider Notes (Signed)
[facility] DEPT Provider Note   CSN: [id number] Arrival date & time: 07/31/21  0818     History  Chief Complaint  Patient presents with   Sickle Cell Pain Crisis    Austin [name] is a 31 y.o. male. PMH includes sickle cell disease. Department in acute sickle cell pain crisis.  He states that he ran out of his home oxycodone and has been out of it for about 1 week.  He describes severe constant pain in his lower back, abdomen, and right lower leg.  He has had having nausea and vomiting.  Vomit is nonbloody and nonbilious.  This is fairly typical of his usual sickle cell crisis, however he says today symptoms are much more severe than normal.  He was seen here yesterday for similar symptoms and was treated, however symptoms never completely resolved and they continued to get worse while he was at home.  Sickle Cell Pain Crisis Associated symptoms: nausea and vomiting       Home Medications Prior to Admission medications   Medication Sig Start Date End Date Taking? Authorizing Provider  Oxycodone HCl 10 MG TABS Take 10 mg by mouth every 4 (four) hours as needed (pain).   Yes [provider]  [employer] NASAL ALLERGY SPRAY 55 MCG/ACT AERO nasal inhaler SMARTSIG:Both Nares 07/21/21   [provider]  dicyclomine (BENTYL) 20 MG tablet Take 1 tablet (20 mg total) by mouth 2 (two) times daily. 03/23/21 03/23/22  Vevelyn Francois, NP  ergocalciferol (VITAMIN D2) 1.25 MG (50000 UT) capsule Take 1 capsule (50,000 Units total) by mouth once a week. X 12 weeks. 05/27/21 05/27/22  Vevelyn Francois, NP  loratadine (CLARITIN) 10 MG tablet Take 1 tablet (10 mg total) by mouth daily. 11/13/20   Vevelyn Francois, NP  Olopatadine HCl 0.2 % SOLN Apply 1 drop to eye in the morning and at bedtime. Patient not taking: No sig reported 11/13/20 11/13/21  Vevelyn Francois, NP  ondansetron (ZOFRAN-ODT) 4 MG disintegrating tablet TAKE 1 TABLET BY MOUTH EVERY 8 HOURS AS NEEDED  FOR UP TO 15 DAYS FOR NAUSEA OR VOMITING. 07/03/21   Vevelyn Francois, NP  triamcinolone (NASACORT) 55 MCG/ACT AERO nasal inhaler Place 2 sprays into the nose daily. 2 sprays each nostril at night 04/27/21   Rozetta Nunnery, MD      Allergies    Patient has no known allergies.    Review of Systems   Review of Systems  Gastrointestinal:  Positive for abdominal pain, nausea and vomiting.  Musculoskeletal:  Positive for arthralgias and back pain.  All other systems reviewed and are negative.  Physical Exam Updated Vital Signs BP (!) 144/77    Pulse (!) 103    Temp (!) 97.4 F (36.3 C) (Oral)    Resp 20    Ht _0  (1.727 m)    Wt 86.6 kg    SpO2 100%    BMI 29.04 kg/m  Physical Exam Vitals and nursing note reviewed.  Constitutional:      General: He is not in acute distress.    Appearance: Normal appearance. He is ill-appearing. He is not toxic-appearing or diaphoretic.     Comments: Patient appears unwell and uncomfortable.  HENT:     Head: Normocephalic and atraumatic.     Nose: No nasal deformity.     Mouth/Throat:     Lips: Pink. No lesions.     Mouth: Mucous membranes are moist. No injury, lacerations,  oral lesions or angioedema.     Pharynx: Oropharynx is clear. Uvula midline. No pharyngeal swelling, oropharyngeal exudate, posterior oropharyngeal erythema or uvula swelling.  Eyes:     General: Gaze aligned appropriately. No scleral icterus.       Right eye: No discharge.        Left eye: No discharge.     Conjunctiva/sclera: Conjunctivae normal.     Right eye: Right conjunctiva is not injected. No exudate or hemorrhage.    Left eye: Left conjunctiva is not injected. No exudate or hemorrhage. Cardiovascular:     Rate and Rhythm: Normal rate and regular rhythm.     Pulses: Normal pulses.          Radial pulses are 2+ on the right side and 2+ on the left side.       Dorsalis pedis pulses are 2+ on the right side and 2+ on the left side.     Heart sounds: Normal heart  sounds, S1 normal and S2 normal. Heart sounds not distant. No murmur heard.   No friction rub. No gallop. No S3 or S4 sounds.  Pulmonary:     Effort: Pulmonary effort is normal. No accessory muscle usage or respiratory distress.     Breath sounds: Normal breath sounds. No stridor. No wheezing, rhonchi or rales.  Chest:     Chest wall: No tenderness.  Abdominal:     General: Abdomen is flat. Bowel sounds are normal. There is no distension.     Palpations: Abdomen is soft. There is no mass or pulsatile mass.     Tenderness: There is abdominal tenderness. There is no guarding or rebound.     Comments: Abdominal tenderness that is pretty severe to the touch.  Musculoskeletal:     Right lower leg: No edema.     Left lower leg: No edema.  Skin:    General: Skin is warm and dry.     Coloration: Skin is not jaundiced or pale.     Findings: No bruising, erythema, lesion or rash.  Neurological:     General: No focal deficit present.     Mental Status: He is alert and oriented to person, place, and time.     GCS: GCS eye subscore is 4. GCS verbal subscore is 5. GCS motor subscore is 6.  Psychiatric:        Mood and Affect: Mood normal.        Behavior: Behavior normal. Behavior is cooperative.    ED Results / Procedures / Treatments   Labs (all labs ordered are listed, but only abnormal results are displayed) Labs Reviewed  CBC WITH DIFFERENTIAL/PLATELET - Abnormal; Notable for the following components:      Result Value   WBC 17.6 (*)    MCV 78.3 (*)    Neutro Abs 16.0 (*)    All other components within normal limits  RETICULOCYTES - Abnormal; Notable for the following components:   Retic Ct Pct 3.7 (*)    Immature Retic Fract 26.1 (*)    All other components within normal limits  BASIC METABOLIC PANEL - Abnormal; Notable for the following components:   Glucose, Bld 138 (*)    All other components within normal limits  HEPATIC FUNCTION PANEL - Abnormal; Notable for the following  components:   Total Protein 9.0 (*)    Albumin 5.1 (*)    AST 12 (*)    Total Bilirubin 1.8 (*)    Bilirubin, Direct 0.4 (*)  Indirect Bilirubin 1.4 (*)    All other components within normal limits  RESP PANEL BY RT-PCR (FLU A&B, COVID) [id number]  LIPASE, BLOOD    EKG EKG Interpretation  Date/Time:  Friday July 31 2021 09:33:26 EST Ventricular Rate:  63 PR Interval:  127 QRS Duration: 92 QT Interval:  395 QTC Calculation: 405 R Axis:   85 Text Interpretation: Sinus rhythm repol pattern No significant change since last tracing Abnormal ECG Confirmed by Carmin Muskrat [phone]) on 07/31/2021 9:42:05 AM  Radiology No results found.  Procedures Procedures  Patient was monitored with continuous cardiac monitoring while in the ED.  Medications Ordered in ED Medications  dextrose 5 %-0.45 % sodium chloride infusion ( Intravenous New Bag/Given 07/31/21 1633)  promethazine (PHENERGAN) 12.5 mg in sodium chloride 0.9 % 50 mL IVPB (0 mg Intravenous Stopped 07/31/21 1711)  HYDROmorphone (DILAUDID) injection 1 mg (1 mg Intravenous Given 07/31/21 2045)  HYDROmorphone (DILAUDID) injection 2 mg (2 mg Intravenous Given 07/31/21 1634)  HYDROmorphone (DILAUDID) injection 2 mg (2 mg Intravenous Given 07/31/21 1733)  diphenhydrAMINE (BENADRYL) injection 25 mg (25 mg Intravenous Given 07/31/21 1633)    ED Course/ Medical Decision Making/ A&P Clinical Course as of 07/31/21 2052  Fri Jul 31, 2021  1842 Patel to accept [GL]    Clinical Course User Index [GL] Sherre Poot Adora Fridge, PA-C                           Medical Decision Making Amount and/or Complexity of Data Reviewed External Data Reviewed: labs, radiology, ECG and notes.    Details: previous admission in August 2022 Labs: ordered. Decision-making details documented in ED Course. ECG/medicine tests: ordered and independent interpretation performed. Decision-making details documented in ED Course. Discussion of management or test interpretation  with external provider(s): Discussed case with hospitalist  Risk Prescription drug management. Parenteral controlled substances. Decision regarding hospitalization.   This is a 31 y.o. male with a PMH of sickle cell disease who presents to the ED in apparent acute pain crisis. This is typical of his pain crises.  There are no apparent signs of acute chest syndrome.  Patient does have pretty diffuse generalized abdominal tenderness.  Sickle cell and abdominal labs were obtained in triage.  Review of Past Records: Last admission for sickle cell pain crisis was in August 2022.  That time, he had similar symptoms with abdominal pain nausea and vomiting.  His work-up was overall unremarkable.  It was thought that the symptoms are consistent with pain crises.  He required PCA drip at this time.  Since then he has been seen in the emergency department multiple times for the same symptoms, however has been able to get them well enough controlled to return home.  Seen yesterday afternoon for a pain crisis and was discharged home with some symptom relief.  Vitals:  stable. Exam: generalized abd ttp. Likely consistent with sickle cell pain crisis. Abdominal labs obtained.   I personally reviewed all laboratory work and imaging. Abnormal results outlined below. Ret count is stable from baseline. Leukocytosis to 17.6,  however, his wbc has been intermittently elevated to around this level. All other labs are stable from baseline. Labs not consistent with aplastic crisis. I don't think that leukocytosis represents an acute infection because he has had similar wbc in the past. Patient is not septic. Not consistent with acute chest syndrome. Likely pain crisis. Patient may require admission since he failed outpatient trial yesterday.  Patient was initiated on IVF, pain regimen, antiemetics. On reevaluation. Patient is still with significant pain. His abdominal pain has improved and he is not so tender on exam. I  do not feel that we need CT abdomen at this time because his presentation seems consistent with his previous pain crises. He would like to be admitted for pain management.   Dispo Plan: admit to hospitalist.   I have seen and evaluated this patient in conjunction with my attending physician who agrees and has made changes to the plan accordingly.  Portions of this note were generated with [employer]. Dictation errors may occur despite best attempts at proofreading.  Final Clinical Impression(s) / ED Diagnoses Final diagnoses:  Sickle cell pain crisis [facility])    Rx / DC Orders ED Discharge Orders     None         Adolphus Birchwood, PA-C 07/31/21 2052    Carmin Muskrat, MD 08/01/21 1106

## 2021-07-31 NOTE — ED Provider Triage Note (Signed)
Emergency Medicine Provider Triage Evaluation Note  Austin [name] , a 31 y.o. male  was evaluated in triage.  History of sickle cell disease presented for what he describes as sickle cell pain crisis onset 2 days ago.  Patient reports he is out of his home pain medication.  Patient reports he was here yesterday for the same back pain has worsened.  He describes severe constant pain of his low back, does not radiate, no clear aggravating or alleviating factors, he reports this is his typical sickle cell pain denies any abnormal features.  He reports he has also developed some nausea as well as nonbloody/nonbilious emesis over the last day which also occurs with his sickle cell pain.  He denies any fall, injury, fever, chills, chest pain, shortness of breath, cough, numbness, tingling, weakness or any additional concerns today.  Review of Systems  Positive: Low back pain, nausea, vomiting Negative: Fever, chills, chest pain, shortness of breath, cough/hemoptysis, numbness, tingling, weakness  Physical Exam  BP (!) 136/98 (BP Location: Left Arm)    Pulse 90    Temp (!) 97.4 F (36.3 C) (Oral)    Resp 18    SpO2 100%  Gen:   Awake, no distress   Resp:  Normal effort  MSK:   Moves extremities without difficulty.  Mild bilateral paraspinal muscular tenderness palpation.  No midline spinal tenderness palpation.  No Step-off or deformity. Other:  Abdomen soft nontender.  Medical Decision Making  Medically screening exam initiated at 8:42 AM.  Appropriate orders placed.  Delontae Lamm Murphey was informed that the remainder of the evaluation will be completed by another provider, this initial triage assessment does not replace that evaluation, and the importance of remaining in the ED until their evaluation is complete.  31 year old male presents for what he describes as his typical sickle cell pain crisis, out of pain medication, also experiencing some nausea vomiting.  No symptoms suggestive of acute  chest syndrome or infection.  SCC labs ordered, lipase, LFTs added.  Note: Portions of this report may have been transcribed using voice recognition software. Every effort was made to ensure accuracy; however, inadvertent computerized transcription errors may still be present.    Bill Sal[place], [place] 07/31/21 [phone]

## 2021-07-31 NOTE — ED Triage Notes (Signed)
Per EMS- patient s/o sickle cell pain and nausea x 3 days. Patient states he is out of his oxycodone and has been out x 1 week. Patient was seen yesterday for the same.

## 2021-07-31 NOTE — ED Notes (Signed)
Hospitalist at bedside 

## 2021-07-31 NOTE — H&P (Signed)
History and Physical    Austin [name] [id number] DOB: [birth year] DOA: 07/31/2021  PCP: Vevelyn Francois, NP  Patient coming from: Home  I have personally briefly reviewed patient's old medical records in [place]  Chief Complaint: Sickle cell pain  HPI: Austin [name] is a 31 y.o. male with medical history significant for sickle cell disease who presented to the ED for evaluation of sickle cell pain.  Patient reports several days of sickle cell pain involving his low back which is typical for his pain crisis episodes.  He usually takes oxycodone 10 mg 4 times daily as needed for pain but ran out of his medications.  He says his next refill was due 12/30 but prescription was not sent in.  He came to the ED yesterday (1/5) for sickle cell pain.  He was treated with subcutaneous hydromorphone x2 with improvement.  He was discharged to home.  Patient returns with continued uncontrolled pain.  He reports some abdominal and bilateral feet pain as well which is relatively new.  He has not had any subjective fevers, chills, diaphoresis, chest pain, dyspnea, cough.  He has had some nausea without emesis.  ED Course:  Initial vitals showed BP 136/98, pulse 90, RR 18, temp 97.4 F, SPO2 100% on room air.  Labs show WBC 17.6, hemoglobin 14.1, platelets 166,000, sodium 136, potassium 3.9, bicarb 26, BUN 10, creatinine 0.94, serum glucose 138, AST 12, ALT 23, alk phos 53, total bilirubin 1.8, lipase 21.  Respiratory panel in process.  Patient was given IV Dilaudid 2 mg x 2, D5-half-normal saline, IV Benadryl.  The hospitalist service was consulted to admit for further management of sick cell pain crisis.  Review of Systems: All systems reviewed and are negative except as documented in history of present illness above.   Past Medical History:  Diagnosis Date   Acute maxillary sinusitis    Eczema    Sickle cell anemia ([place]) .    Past Surgical History:  Procedure Laterality  Date   FOOT SURGERY Left 12/25/2020   WISDOM TOOTH EXTRACTION      Social History:  reports that he has quit smoking. His smoking use included cigarettes. He smoked an average of .1 packs per day. He has never used smokeless tobacco. He reports that he does not currently use alcohol. He reports current drug use. Drug: Marijuana.  No Known Allergies  Family History  Problem Relation Age of Onset   Sickle cell trait Mother    Diabetes Father    Hypertension Father      Prior to Admission medications   Medication Sig Start Date End Date Taking? Authorizing Provider  dicyclomine (BENTYL) 20 MG tablet Take 1 tablet (20 mg total) by mouth 2 (two) times daily. 03/23/21 03/23/22  Vevelyn Francois, NP  ergocalciferol (VITAMIN D2) 1.25 MG (50000 UT) capsule Take 1 capsule (50,000 Units total) by mouth once a week. X 12 weeks. 05/27/21 05/27/22  Vevelyn Francois, NP  loratadine (CLARITIN) 10 MG tablet Take 1 tablet (10 mg total) by mouth daily. 11/13/20   Vevelyn Francois, NP  Olopatadine HCl 0.2 % SOLN Apply 1 drop to eye in the morning and at bedtime. Patient not taking: No sig reported 11/13/20 11/13/21  Vevelyn Francois, NP  ondansetron (ZOFRAN-ODT) 4 MG disintegrating tablet TAKE 1 TABLET BY MOUTH EVERY 8 HOURS AS NEEDED FOR UP TO 15 DAYS FOR NAUSEA OR VOMITING. 07/03/21   Vevelyn Francois, NP  triamcinolone (NASACORT)  55 MCG/ACT AERO nasal inhaler Place 2 sprays into the nose daily. 2 sprays each nostril at night 04/27/21   Rozetta Nunnery, MD    Physical Exam: Vitals:   07/31/21 1730 07/31/21 1800 07/31/21 1815 07/31/21 1830  BP: 127/82 130/87 130/84 (!) 144/77  Pulse: 86 83 95 (!) 103  Resp: _0 Temp:      TempSrc:      SpO2: 98% 100% 99% 100%  Weight:      Height:       Constitutional: Resting in bed, NAD, calm, comfortable Eyes: PERRL, lids and conjunctivae normal ENMT: Mucous membranes are moist. Posterior pharynx clear of any exudate or lesions.Normal dentition.  Neck:  normal, supple, no masses. Respiratory: clear to auscultation bilaterally, no wheezing, no crackles. Normal respiratory effort. No accessory muscle use.  Cardiovascular: Regular rate and rhythm, no murmurs / rubs / gallops. No extremity edema. 2+ pedal pulses. Abdomen: Mild right-sided tenderness, no masses palpated. No hepatosplenomegaly. Bowel sounds positive.  Musculoskeletal: no clubbing / cyanosis. No joint deformity upper and lower extremities. Good ROM, no contractures. Normal muscle tone.  Skin: no rashes, lesions, ulcers. No induration Neurologic: CN 2-12 grossly intact. Sensation intact. Strength 5/5 in all 4.  Psychiatric: Normal judgment and insight. Alert and oriented x 3. Normal mood.   Labs on Admission: I have personally reviewed following labs and imaging studies  CBC: Recent Labs  Lab 07/30/21 0820 07/31/21 1637  WBC 10.9* 17.6*  NEUTROABS 7.9* 16.0*  HGB 13.5 14.1  HCT 37.3* 39.3  MCV 79.5* 78.3*  PLT 163 314   Basic Metabolic Panel: Recent Labs  Lab 07/30/21 0820 07/31/21 1637  NA 138 136  K 4.0 3.9  CL 103 100  CO2 26 26  GLUCOSE 129* 138*  BUN 11 10  CREATININE 0.92 0.94  CALCIUM 9.6 10.0   GFR: Estimated Creatinine Clearance: 121.9 mL/min (by C-G formula based on SCr of 0.94 mg/dL). Liver Function Tests: Recent Labs  Lab 07/30/21 0820 07/31/21 1637  AST 12* 12*  ALT 22 23  ALKPHOS 49 53  BILITOT 1.6* 1.8*  PROT 7.7 9.0*  ALBUMIN 4.5 5.1*   Recent Labs  Lab 07/31/21 1637  LIPASE 21   No results for input(s): AMMONIA in the last 168 hours. Coagulation Profile: No results for input(s): INR, PROTIME in the last 168 hours. Cardiac Enzymes: No results for input(s): CKTOTAL, CKMB, CKMBINDEX, TROPONINI in the last 168 hours. BNP (last 3 results) No results for input(s): PROBNP in the last 8760 hours. HbA1C: No results for input(s): HGBA1C in the last 72 hours. CBG: No results for input(s): GLUCAP in the last 168 hours. Lipid  Profile: No results for input(s): CHOL, HDL, LDLCALC, TRIG, CHOLHDL, LDLDIRECT in the last 72 hours. Thyroid Function Tests: No results for input(s): TSH, T4TOTAL, FREET4, T3FREE, THYROIDAB in the last 72 hours. Anemia Panel: Recent Labs    07/30/21 0820 07/31/21 1638  RETICCTPCT 3.6* 3.7*   Urine analysis:    Component Value Date/Time   COLORURINE YELLOW 05/21/2019 2325   APPEARANCEUR HAZY (A) 05/21/2019 2325   LABSPEC 1.020 05/21/2019 2325   PHURINE 8.0 05/21/2019 2325   GLUCOSEU 50 (A) 05/21/2019 2325   HGBUR NEGATIVE 05/21/2019 2325   BILIRUBINUR negative 03/23/2021 1337   BILIRUBINUR negative 02/05/2020 0749   KETONESUR negative 03/23/2021 1337   KETONESUR NEGATIVE 05/21/2019 2325   PROTEINUR Negative 02/05/2020 0749   PROTEINUR NEGATIVE 05/21/2019 2325   UROBILINOGEN 2.0 (A) 03/23/2021 1337  UROBILINOGEN 1.0 10/18/2017 0841   NITRITE Negative 03/23/2021 1337   NITRITE negative 02/05/2020 0749   NITRITE NEGATIVE 05/21/2019 2325   LEUKOCYTESUR Negative 03/23/2021 [place] 05/21/2019 2325    Radiological Exams on Admission: No results found.  EKG: Personally reviewed. Normal sinus rhythm with early repolarization pattern, LVH.  Similar to prior.  Assessment/Plan Principal Problem:   Sickle cell pain crisis ([place])   BANKS CHAIKIN is a 31 y.o. male with medical history significant for sickle cell disease who is admitted for sickle cell pain crisis.  Sickle cell pain crisis: Pain uncontrolled after running out of home oxycodone.  Admitted for continued pain control. -Continue IV Dilaudid 1 mg every 2 hours as needed -Start IV Toradol 15 mg every 6 hours -Continue IV fluids with D5-1/2 NS_0  mL/hour  Leukocytosis: Suspect related to acute sickle cell pain crisis.  No obvious infectious symptoms.  Denies any chest pain, dyspnea, fevers, cough, dysuria.  Continue to monitor.  DVT prophylaxis: Lovenox Code Status: Full code, confirmed on  admission Family Communication: Discussed with patient, he has discussed with family Disposition Plan: From home and likely discharge to home pending adequate pain control Consults called: None Level of care: Med-Surg Admission status:  Status is: Observation  The patient remains OBS appropriate and will d/c before 2 midnights.  Zada Finders MD [facility]  If 7PM-7AM, please contact night-coverage [email or web address]  07/31/2021, 9:18 PM

## 2021-07-31 NOTE — ED Notes (Signed)
I attempted to collect labs and was unsuccessful.  I did not see or feel any where to collect from.

## 2021-08-01 DIAGNOSIS — D72829 Elevated white blood cell count, unspecified: Secondary | ICD-10-CM | POA: Diagnosis not present

## 2021-08-01 DIAGNOSIS — D5700 Hb-SS disease with crisis, unspecified: Secondary | ICD-10-CM

## 2021-08-01 DIAGNOSIS — Z20822 Contact with and (suspected) exposure to covid-19: Secondary | ICD-10-CM | POA: Diagnosis not present

## 2021-08-01 MED ORDER — OXYCODONE HCL 10 MG PO TABS: 10.0000 mg | ORAL_TABLET | ORAL | 0 refills | Status: AC

## 2021-08-01 NOTE — Discharge Summary (Signed)
Physician Discharge Summary  [name] UMP:[id number] DOB: 1990-09-27 DOA: 07/31/2021  PCP: Vevelyn Francois, NP  Admit date: 07/31/2021  Discharge date: 08/01/2021  Discharge Diagnoses:  Principal Problem:   Sickle cell pain crisis [facility])  Discharge Condition: Stable  Disposition:   Follow-up Information     Vevelyn Francois, NP. Schedule an appointment as soon as possible for a visit in 1 week(s).   Specialty: Adult Health Nurse Practitioner Contact information: [address] [place] [address] [phone]                Pt is discharged home in good condition and is to follow up with Vevelyn Francois, NP this week to have labs evaluated. Michah Minton Lamica is instructed to increase activity slowly and balance with rest for the next few days, and use prescribed medication to complete treatment of pain  Diet: Regular Wt Readings from Last 3 Encounters:  07/31/21 80.6 kg  07/30/21 86.6 kg  06/26/21 87.1 kg    History of present illness:  Austin [name] is a 31 y.o. male with medical history significant for sickle cell disease who presented to the ED for evaluation of sickle cell pain.  Patient reports several days of sickle cell pain involving his low back which is typical for his pain crisis episodes.  He usually takes oxycodone 10 mg 4 times daily as needed for pain but ran out of his medications. He says his next refill was due 12/30 but prescription was not sent in.  He came to the ED yesterday (1/5) for sickle cell pain.  He was treated with subcutaneous hydromorphone x2 with improvement.  He was discharged to home.  Patient returns with continued uncontrolled pain.  He reports some abdominal and bilateral feet pain as well which is relatively new.  He has not had any subjective fevers, chills, diaphoresis, chest pain, dyspnea, cough.  He has had some nausea without emesis.   ED Course:  Initial vitals showed BP 136/98, pulse 90, RR 18, temp 97.4 F,  SPO2 100% on room air.   Labs show WBC 17.6, hemoglobin 14.1, platelets 166,000, sodium 136, potassium 3.9, bicarb 26, BUN 10, creatinine 0.94, serum glucose 138, AST 12, ALT 23, alk phos 53, total bilirubin 1.8, lipase 21.   Respiratory panel in process.   Patient was given IV Dilaudid 2 mg x 2, D5-half-normal saline, IV Benadryl.  The hospitalist service was consulted to admit for further management of sick cell pain crisis.  Hospital Course:  Patient was admitted for sickle cell pain crisis and managed appropriately with IVF, IV Dilaudid via PCA and IV Toradol, as well as other adjunct therapies per sickle cell pain management protocols.  Patient was admitted overnight, he did well on this regimen and when seen this morning, clean his pain is at a 1/10 and want to be discharged home. He needs refill of his pain medication which was sent to the pharmacy for a 15-day supply. Patient is tolerating p.o. intake, hemodynamically stable, ambulating well with no pain. Patient was therefore discharged home today in a hemodynamically stable condition.   Zayvier will follow-up with PCP within 1 week of this discharge. Manjinder was counseled extensively about nonpharmacologic means of pain management, patient verbalized understanding and was appreciative of  the care received during this admission.   We discussed the need for good hydration, monitoring of hydration status, avoidance of heat, cold, stress, and infection triggers. We discussed the need to be  adherent with taking Hydrea and other home medications. Patient was reminded of the need to seek medical attention immediately if any symptom of bleeding, anemia, or infection occurs.  Discharge Exam: Vitals:   08/01/21 0531 08/01/21 1113  BP: 120/69 124/78  Pulse: 84 82  Resp: 18 16  Temp: 98.2 F (36.8 C) 98.4 F (36.9 C)  SpO2: 100% 99%   Vitals:   07/31/21 2151 08/01/21 0158 08/01/21 0531 08/01/21 1113  BP: 123/88 119/75 120/69 124/78  Pulse: 96  78 84 82  Resp: _0 Temp: 98.2 F (36.8 C) 97.9 F (36.6 C) 98.2 F (36.8 C) 98.4 F (36.9 C)  TempSrc: Oral Oral Oral Oral  SpO2: 100% 100% 100% 99%  Weight: 80.6 kg     Height:       General appearance : Awake, alert, not in any distress. Speech Clear. Not toxic looking HEENT: Atraumatic and Normocephalic, pupils equally reactive to light and accomodation Neck: Supple, no JVD. No cervical lymphadenopathy.  Chest: Good air entry bilaterally, no added sounds  CVS: S1 S2 regular, no murmurs.  Abdomen: Bowel sounds present, Non tender and not distended with no gaurding, rigidity or rebound. Extremities: B/L Lower Ext shows no edema, both legs are warm to touch Neurology: Awake alert, and oriented X 3, CN II-XII intact, Non focal Skin: No Rash  Discharge Instructions  Discharge Instructions     Diet - low sodium heart healthy   Complete by: As directed    Increase activity slowly   Complete by: As directed       Allergies as of 08/01/2021   No Known Allergies      Medication List     TAKE these medications    dicyclomine 20 MG tablet Commonly known as: BENTYL Take 1 tablet (20 mg total) by mouth 2 (two) times daily.   ergocalciferol 1.25 MG (50000 UT) capsule Commonly known as: VITAMIN D2 Take 1 capsule (50,000 Units total) by mouth once a week. X 12 weeks. What changed: when to take this   loratadine 10 MG tablet Commonly known as: CLARITIN Take 1 tablet (10 mg total) by mouth daily. What changed:  when to take this reasons to take this   Olopatadine HCl 0.2 % Soln Apply 1 drop to eye in the morning and at bedtime.   ondansetron 4 MG disintegrating tablet Commonly known as: ZOFRAN-ODT TAKE 1 TABLET BY MOUTH EVERY 8 HOURS AS NEEDED FOR UP TO 15 DAYS FOR NAUSEA OR VOMITING. What changed: See the new instructions.   Oxycodone HCl 10 MG Tabs Take 1 tablet (10 mg total) by mouth See admin instructions for 15 days. What changed:  how much to  take additional instructions   triamcinolone 55 MCG/ACT Aero nasal inhaler Commonly known as: NASACORT Place 2 sprays into the nose daily. 2 sprays each nostril at night   [employer] Nasal Allergy Spray 55 MCG/ACT Aero nasal inhaler Generic drug: triamcinolone Place 1-2 sprays into the nose daily as needed (for allergies).        The results of significant diagnostics from this hospitalization (including imaging, microbiology, ancillary and laboratory) are listed below for reference.    Significant Diagnostic Studies: No results found.  Microbiology: Recent Results (from the past 240 hour(s))  Resp Panel by RT-PCR (Flu A&B, Covid) Nasopharyngeal Swab     Status: None   Collection Time: 07/31/21  6:44 PM   Specimen: Nasopharyngeal Swab; Nasopharyngeal(NP) swabs in vial transport medium  Result Value Ref  Range Status   SARS Coronavirus 2 by RT PCR NEGATIVE NEGATIVE Final    Comment: (NOTE) SARS-CoV-2 target nucleic acids are NOT DETECTED.  The SARS-CoV-2 RNA is generally detectable in upper respiratory specimens during the acute phase of infection. The lowest concentration of SARS-CoV-2 viral copies this assay can detect is 138 copies/mL. A negative result does not preclude SARS-Cov-2 infection and should not be used as the sole basis for treatment or other patient management decisions. A negative result may occur with  improper specimen collection/handling, submission of specimen other than nasopharyngeal swab, presence of viral mutation(s) within the areas targeted by this assay, and inadequate number of viral copies(<138 copies/mL). A negative result must be combined with clinical observations, patient history, and epidemiological information. The expected result is Negative.  Fact Sheet for Patients:  EntrepreneurPulse.com.au  Fact Sheet for Healthcare Providers:  IncredibleEmployment.be  This test is no t yet approved or cleared by the  [place] FDA and  has been authorized for detection and/or diagnosis of SARS-CoV-2 by FDA under an Emergency Use Authorization (EUA). This EUA will remain  in effect (meaning this test can be used) for the duration of the COVID-19 declaration under Section 564(b)(1) of the Act, 21 U.S.C.section 360bbb-3(b)(1), unless the authorization is terminated  or revoked sooner.       Influenza A by PCR NEGATIVE NEGATIVE Final   Influenza B by PCR NEGATIVE NEGATIVE Final    Comment: (NOTE) The Xpert Xpress SARS-CoV-2/FLU/RSV plus assay is intended as an aid in the diagnosis of influenza from Nasopharyngeal swab specimens and should not be used as a sole basis for treatment. Nasal washings and aspirates are unacceptable for Xpert Xpress SARS-CoV-2/FLU/RSV testing.  Fact Sheet for Patients: EntrepreneurPulse.com.au  Fact Sheet for Healthcare Providers: IncredibleEmployment.be  This test is not yet approved or cleared by the [place] FDA and has been authorized for detection and/or diagnosis of SARS-CoV-2 by FDA under an Emergency Use Authorization (EUA). This EUA will remain in effect (meaning this test can be used) for the duration of the COVID-19 declaration under Section 564(b)(1) of the Act, 21 U.S.C. section 360bbb-3(b)(1), unless the authorization is terminated or revoked.  Performed at [facility], [place] [address] [place], [place] [address]      Labs: Basic Metabolic Panel: Recent Labs  Lab 07/30/21 0820 07/31/21 1637  NA 138 136  K 4.0 3.9  CL 103 100  CO2 26 26  GLUCOSE 129* 138*  BUN 11 10  CREATININE 0.92 0.94  CALCIUM 9.6 10.0   Liver Function Tests: Recent Labs  Lab 07/30/21 0820 07/31/21 1637  AST 12* 12*  ALT 22 23  ALKPHOS 49 53  BILITOT 1.6* 1.8*  PROT 7.7 9.0*  ALBUMIN 4.5 5.1*   Recent Labs  Lab 07/31/21 1637  LIPASE 21   No results for input(s): AMMONIA in the last 168  hours. CBC: Recent Labs  Lab 07/30/21 0820 07/31/21 1637  WBC 10.9* 17.6*  NEUTROABS 7.9* 16.0*  HGB 13.5 14.1  HCT 37.3* 39.3  MCV 79.5* 78.3*  PLT 163 166   Cardiac Enzymes: No results for input(s): CKTOTAL, CKMB, CKMBINDEX, TROPONINI in the last 168 hours. BNP: Invalid input(s): POCBNP CBG: No results for input(s): GLUCAP in the last 168 hours.  Time coordinating discharge: 50 minutes  Signed:  [facility] 08/01/2021, 12:17 PM

## 2021-08-04 ENCOUNTER — Encounter (HOSPITAL_COMMUNITY): Payer: Self-pay

## 2021-08-04 ENCOUNTER — Emergency Department (HOSPITAL_COMMUNITY): Payer: 59

## 2021-08-04 ENCOUNTER — Emergency Department (HOSPITAL_COMMUNITY)
Admission: EM | Admit: 2021-08-04 | Discharge: 2021-08-04 | Disposition: A | Discharge status: Home / Self Care | Payer: 59 | Attending: Emergency Medicine | Admitting: Emergency Medicine

## 2021-08-04 DIAGNOSIS — R079 Chest pain, unspecified: Secondary | ICD-10-CM | POA: Diagnosis not present

## 2021-08-04 DIAGNOSIS — R110 Nausea: Secondary | ICD-10-CM | POA: Diagnosis not present

## 2021-08-04 DIAGNOSIS — R112 Nausea with vomiting, unspecified: Secondary | ICD-10-CM | POA: Diagnosis not present

## 2021-08-04 DIAGNOSIS — R739 Hyperglycemia, unspecified: Secondary | ICD-10-CM | POA: Diagnosis not present

## 2021-08-04 DIAGNOSIS — D5700 Hb-SS disease with crisis, unspecified: Secondary | ICD-10-CM | POA: Insufficient documentation

## 2021-08-04 DIAGNOSIS — I10 Essential (primary) hypertension: Secondary | ICD-10-CM | POA: Diagnosis not present

## 2021-08-04 DIAGNOSIS — R1084 Generalized abdominal pain: Secondary | ICD-10-CM | POA: Diagnosis not present

## 2021-08-04 DIAGNOSIS — R0789 Other chest pain: Secondary | ICD-10-CM | POA: Diagnosis not present

## 2021-08-04 DIAGNOSIS — R1111 Vomiting without nausea: Secondary | ICD-10-CM | POA: Diagnosis not present

## 2021-08-04 LAB — RETICULOCYTES
Immature Retic Fract: 37.1 % — ABNORMAL HIGH (ref 2.3–15.9)
RBC.: 4.61 MIL/uL (ref 4.22–5.81)
Retic Count, Absolute: 191.8 10*3/uL — ABNORMAL HIGH (ref 19.0–186.0)
Retic Ct Pct: 4.2 % — ABNORMAL HIGH (ref 0.4–3.1)

## 2021-08-04 LAB — COMPREHENSIVE METABOLIC PANEL
ALT: 28 U/L (ref 0–44)
AST: 12 U/L — ABNORMAL LOW (ref 15–41)
Albumin: 4.9 g/dL (ref 3.5–5.0)
Alkaline Phosphatase: 52 U/L (ref 38–126)
Anion gap: 8 (ref 5–15)
BUN: 14 mg/dL (ref 6–20)
CO2: 27 mmol/L (ref 22–32)
Calcium: 9.7 mg/dL (ref 8.9–10.3)
Chloride: 103 mmol/L (ref 98–111)
Creatinine, Ser: 0.96 mg/dL (ref 0.61–1.24)
GFR, Estimated: >60 mL/min (ref >60)
Glucose, Bld: 141 mg/dL — ABNORMAL HIGH (ref 70–99)
Potassium: 3.9 mmol/L (ref 3.5–5.1)
Sodium: 138 mmol/L (ref 135–145)
Total Bilirubin: 1.8 mg/dL — ABNORMAL HIGH (ref 0.3–1.2)
Total Protein: 8.5 g/dL — ABNORMAL HIGH (ref 6.5–8.1)

## 2021-08-04 LAB — CBC WITH DIFFERENTIAL/PLATELET
Abs Immature Granulocytes: 0.08 10*3/uL — ABNORMAL HIGH (ref 0.00–0.07)
Basophils Absolute: 0.1 10*3/uL (ref 0.0–0.1)
Basophils Relative: 0 %
Eosinophils Absolute: 0.2 10*3/uL (ref 0.0–0.5)
Eosinophils Relative: 1 %
HCT: 36.3 % — ABNORMAL LOW (ref 39.0–52.0)
Hemoglobin: 13.2 g/dL (ref 13.0–17.0)
Immature Granulocytes: 1 %
Lymphocytes Relative: 17 %
Lymphs Abs: 2.4 10*3/uL (ref 0.7–4.0)
MCH: 28.3 pg (ref 26.0–34.0)
MCHC: 36.4 g/dL — ABNORMAL HIGH (ref 30.0–36.0)
MCV: 77.7 fL — ABNORMAL LOW (ref 80.0–100.0)
Monocytes Absolute: 1 10*3/uL (ref 0.1–1.0)
Monocytes Relative: 7 %
Neutro Abs: 10 10*3/uL — ABNORMAL HIGH (ref 1.7–7.7)
Neutrophils Relative %: 74 %
Platelets: 131 10*3/uL — ABNORMAL LOW (ref 150–400)
RBC: 4.67 MIL/uL (ref 4.22–5.81)
RDW: 14.1 % (ref 11.5–15.5)
WBC: 13.6 10*3/uL — ABNORMAL HIGH (ref 4.0–10.5)
nRBC: 0.4 % — ABNORMAL HIGH (ref 0.0–0.2)

## 2021-08-04 MED ORDER — ONDANSETRON 4 MG PO TBDP
4.0000 mg | ORAL_TABLET | Freq: Once | ORAL | Status: AC
  Administered 2021-08-04: 4 mg via ORAL
  Filled 2021-08-04: qty 1

## 2021-08-04 MED ORDER — SODIUM CHLORIDE 0.45 % IV SOLN: INTRAVENOUS | Status: DC

## 2021-08-04 MED ORDER — SODIUM CHLORIDE 0.9 % IV SOLN: Freq: Once | INTRAVENOUS | Status: AC

## 2021-08-04 MED ORDER — DIPHENHYDRAMINE HCL 50 MG/ML IJ SOLN
25.0000 mg | Freq: Once | INTRAMUSCULAR | Status: AC
  Administered 2021-08-04: 25 mg via INTRAVENOUS
  Filled 2021-08-04: qty 1

## 2021-08-04 MED ORDER — HYDROMORPHONE HCL 2 MG/ML IJ SOLN
2.0000 mg | INTRAMUSCULAR | Status: AC
  Administered 2021-08-04: 2 mg via INTRAVENOUS
  Filled 2021-08-04: qty 1

## 2021-08-04 MED ORDER — HYDROMORPHONE HCL 2 MG/ML IJ SOLN
2.0000 mg | Freq: Once | INTRAMUSCULAR | Status: AC
  Administered 2021-08-04: 2 mg via INTRAVENOUS
  Filled 2021-08-04: qty 1

## 2021-08-04 MED ORDER — ONDANSETRON HCL 4 MG/2ML IJ SOLN: 4.0000 mg | INTRAMUSCULAR | Status: DC | PRN

## 2021-08-04 NOTE — ED Provider Notes (Signed)
[facility] DEPT Provider Note   CSN: [id number] Arrival date & time: 08/04/21  0547     History  Chief Complaint  Patient presents with   Sickle Cell Pain Crisis    Austin [name] is a 31 y.o. male.  This is a 31 y.o. male with significant medical history as below, including sickle cell disease, anemia of chronic disease  who presents to the ED with complaint of pain.  Patient reports onset of pain to his low back, chest, legs associate with nausea and vomiting over the past 3 hours.  Consistent with prior episodes of sickle cell pain crisis. Pain described as sharp/ stabbing/ non radiating, aching. Patient has not tried taking his home analgesics morning secondary to not eating anything.  No fevers or chills.  No no dyspnea, rashes, headaches, numbness or tingling.  No change in bowel or bladder function.  No changes to his regular medication regimen.  Last oral intake was yesterday evening.  Normal state of health when he went to bed last night.  No other acute medical complaints offered.  The history is provided by the patient. No language interpreter was used.  Sickle Cell Pain Crisis Associated symptoms: chest pain, nausea and vomiting   Associated symptoms: no cough, no fever, no headaches and no shortness of breath   Patient Active Problem List   Diagnosis Date Noted   Hypokalemia 03/05/2021   Anemia of chronic disease 03/05/2021   Vitamin D deficiency 11/16/2020   Bilateral lower extremity pain 10/17/2019   Sickle cell anemia without crisis ([place]) 09/20/2019   Chronic pain syndrome 09/20/2019   Nausea 09/20/2019   Chronic, continuous use of opioids 09/20/2019   Medication management 03/20/2019   Bilious vomiting without nausea    Viral gastroenteritis 01/23/2019   Nausea vomiting and diarrhea    Generalized abdominal pain    Hb-SS disease with crisis ([place])    Dehydration    Sickle cell pain crisis ([place]) 05/18/2018   Acute sickle cell  crisis ([place]) 12/15/2017   Thrombocytopenia ([place]) 12/15/2017   Splenomegaly 12/15/2017   Sickle cell anemia with crisis ([place]) 12/15/2017   Spleen enlarged 08/18/2017       Home Medications Prior to Admission medications   Medication Sig Start Date End Date Taking? Authorizing Provider  [facility] NASAL ALLERGY SPRAY 55 MCG/ACT AERO nasal inhaler Place 1-2 sprays into the nose daily as needed (for allergies). 07/21/21   [provider]  dicyclomine (BENTYL) 20 MG tablet Take 1 tablet (20 mg total) by mouth 2 (two) times daily. 03/23/21 03/23/22  Vevelyn Francois, NP  ergocalciferol (VITAMIN D2) 1.25 MG (50000 UT) capsule Take 1 capsule (50,000 Units total) by mouth once a week. X 12 weeks. Patient taking differently: Take 50,000 Units by mouth every Thursday. X 12 weeks. 05/27/21 05/27/22  Vevelyn Francois, NP  loratadine (CLARITIN) 10 MG tablet Take 1 tablet (10 mg total) by mouth daily. Patient taking differently: Take 10 mg by mouth daily as needed for allergies or rhinitis. 11/13/20   Vevelyn Francois, NP  Olopatadine HCl 0.2 % SOLN Apply 1 drop to eye in the morning and at bedtime. Patient not taking: Reported on 07/31/2021 11/13/20 11/13/21  Vevelyn Francois, NP  ondansetron (ZOFRAN-ODT) 4 MG disintegrating tablet TAKE 1 TABLET BY MOUTH EVERY 8 HOURS AS NEEDED FOR UP TO 15 DAYS FOR NAUSEA OR VOMITING. Patient taking differently: Take 4 mg by mouth See admin instructions. Dissolve 4 mg in the mouth  two times a day and an additional 4 mg once a day as needed for nausea and/or vomiting 07/03/21   Vevelyn Francois, NP  Oxycodone HCl 10 MG TABS Take 1 tablet (10 mg total) by mouth See admin instructions for 15 days. 08/01/21 08/16/21  Tresa Garter, MD  triamcinolone (NASACORT) 55 MCG/ACT AERO nasal inhaler Place 2 sprays into the nose daily. 2 sprays each nostril at night Patient not taking: Reported on 07/31/2021 04/27/21   Rozetta Nunnery, MD      Allergies    Patient has no known allergies.     Review of Systems   Review of Systems  Constitutional:  Negative for chills and fever.  HENT:  Negative for facial swelling and trouble swallowing.   Eyes:  Negative for photophobia and visual disturbance.  Respiratory:  Negative for cough and shortness of breath.   Cardiovascular:  Positive for chest pain. Negative for palpitations.  Gastrointestinal:  Positive for abdominal pain, nausea and vomiting. Negative for diarrhea.  Endocrine: Negative for polydipsia and polyuria.  Genitourinary:  Negative for difficulty urinating and hematuria.  Musculoskeletal:  Positive for arthralgias. Negative for gait problem and joint swelling.  Skin:  Negative for pallor and rash.  Neurological:  Negative for syncope and headaches.  Psychiatric/Behavioral:  Negative for agitation and confusion.    Physical Exam Updated Vital Signs BP (!) 130/91    Pulse 80    Temp 97.7 F (36.5 C) (Oral)    Resp 13    Ht 5\' 8"  (1.727 m)    Wt 81.6 kg    SpO2 100%    BMI 27.37 kg/m  Physical Exam Vitals and nursing note reviewed.  Constitutional:      General: He is not in acute distress.    Appearance: Normal appearance. He is well-developed. He is not ill-appearing, toxic-appearing or diaphoretic.  HENT:     Head: Normocephalic and atraumatic.     Right Ear: External ear normal.     Left Ear: External ear normal.     Mouth/Throat:     Mouth: Mucous membranes are moist.  Eyes:     General: No scleral icterus. Cardiovascular:     Rate and Rhythm: Normal rate and regular rhythm.     Pulses: Normal pulses.     Heart sounds: Normal heart sounds.  Pulmonary:     Effort: Pulmonary effort is normal. No respiratory distress.     Breath sounds: Normal breath sounds.  Abdominal:     General: Abdomen is flat. There is no distension.     Palpations: Abdomen is soft.     Tenderness: There is no abdominal tenderness. There is no guarding.  Musculoskeletal:        General: Normal range of motion.     Cervical  back: Normal range of motion.     Right lower leg: No edema.     Left lower leg: No edema.  Skin:    General: Skin is warm and dry.     Capillary Refill: Capillary refill takes less than 2 seconds.  Neurological:     Mental Status: He is alert and oriented to person, place, and time.  Psychiatric:        Mood and Affect: Mood normal.        Behavior: Behavior normal.    ED Results / Procedures / Treatments   Labs (all labs ordered are listed, but only abnormal results are displayed) Labs Reviewed  CBC WITH DIFFERENTIAL/PLATELET - Abnormal;  Notable for the following components:      Result Value   WBC 13.6 (*)    HCT 36.3 (*)    MCV 77.7 (*)    MCHC 36.4 (*)    Platelets 131 (*)    nRBC 0.4 (*)    Neutro Abs 10.0 (*)    Abs Immature Granulocytes 0.08 (*)    All other components within normal limits  RETICULOCYTES - Abnormal; Notable for the following components:   Retic Ct Pct 4.2 (*)    Retic Count, Absolute 191.8 (*)    Immature Retic Fract 37.1 (*)    All other components within normal limits  COMPREHENSIVE METABOLIC PANEL - Abnormal; Notable for the following components:   Glucose, Bld 141 (*)    Total Protein 8.5 (*)    AST 12 (*)    Total Bilirubin 1.8 (*)    All other components within normal limits    EKG None  Radiology No results found.  Procedures Procedures    Medications Ordered in ED Medications  HYDROmorphone (DILAUDID) injection 2 mg (has no administration in time range)  ondansetron (ZOFRAN) injection 4 mg (has no administration in time range)  HYDROmorphone (DILAUDID) injection 2 mg (2 mg Intravenous Given 08/04/21 0646)  diphenhydrAMINE (BENADRYL) injection 25 mg (25 mg Intravenous Given 08/04/21 0647)  ondansetron (ZOFRAN-ODT) disintegrating tablet 4 mg (4 mg Oral Given 08/04/21 0615)  0.9 %  sodium chloride infusion ( Intravenous New Bag/Given 08/04/21 [id number])    ED Course/ Medical Decision Making/ A&P                           Medical  Decision Making   CC: sickle cell pain   This patient complains of above; this involves an extensive number of treatment options and is a complaint that carries with it a high risk of complications and morbidity. Vital signs were reviewed. Serious etiologies considered.  Record review:  Previous records obtained and reviewed   Work up as above, notable for:  Labs & imaging results that were available during my care of the patient were reviewed by me and considered in my medical decision making.   I ordered imaging studies which included CXR, pt initially refused to have CXR completed, will re-attempt once he has received medications.  Management: Given analgesics, anti-emetics, IVF  Labs reviewed and are stable.   Pt pending medication at this time. Signed out to incoming EDP pending analgesics and re-assessment, would favor discharge home if pain and emesis is well controlled. HDS.       This chart was dictated using voice recognition software.  Despite best efforts to proofread,  errors can occur which can change the documentation meaning.  Final Clinical Impression(s) / ED Diagnoses Final diagnoses:  Sickle cell pain crisis [facility])    Rx / DC Orders ED Discharge Orders     None         Jeanell Sparrow, DO 08/04/21 [id number]

## 2021-08-04 NOTE — ED Provider Notes (Signed)
Pt signed out by Dr. Wallace Cullens pending pain control.  Pt is feeling much better.  He said his pain is down to a 3.  He does not feel he needs admission.  He is able to tolerate apple juice and water.  Pt is to f/u with his pcp and to return if worse.    Jacalyn Lefevre, MD 08/04/21 1044

## 2021-08-04 NOTE — ED Triage Notes (Signed)
Patient brought in by EMS from home. Patient reports abdominal pain, N/V for 2 hours. Patient has history of sickle cell disease.

## 2021-08-17 ENCOUNTER — Other Ambulatory Visit: Payer: Self-pay | Admitting: Nurse Practitioner

## 2021-08-17 DIAGNOSIS — G894 Chronic pain syndrome: Secondary | ICD-10-CM

## 2021-08-17 DIAGNOSIS — F1190 Opioid use, unspecified, uncomplicated: Secondary | ICD-10-CM

## 2021-08-17 MED ORDER — OXYCODONE HCL 10 MG PO TABS: 10.0000 mg | ORAL_TABLET | Freq: Four times a day (QID) | ORAL | 0 refills | Status: DC | PRN

## 2021-08-21 ENCOUNTER — Encounter: Payer: Self-pay | Admitting: Nurse Practitioner

## 2021-08-21 ENCOUNTER — Ambulatory Visit: Payer: 59 | Admitting: Nurse Practitioner

## 2021-08-21 ENCOUNTER — Other Ambulatory Visit: Payer: Self-pay

## 2021-08-21 VITALS — BP 107/63 | HR 86 | Resp 16

## 2021-08-21 DIAGNOSIS — D571 Sickle-cell disease without crisis: Secondary | ICD-10-CM | POA: Diagnosis not present

## 2021-08-21 DIAGNOSIS — G894 Chronic pain syndrome: Secondary | ICD-10-CM

## 2021-08-21 DIAGNOSIS — F1190 Opioid use, unspecified, uncomplicated: Secondary | ICD-10-CM | POA: Diagnosis not present

## 2021-08-21 DIAGNOSIS — R2242 Localized swelling, mass and lump, left lower limb: Secondary | ICD-10-CM | POA: Diagnosis not present

## 2021-08-21 LAB — POCT URINALYSIS DIP (CLINITEK)
Bilirubin, UA: NEGATIVE
Blood, UA: NEGATIVE
Glucose, UA: NEGATIVE mg/dL
Ketones, POC UA: NEGATIVE mg/dL
Leukocytes, UA: NEGATIVE
Nitrite, UA: NEGATIVE
POC PROTEIN,UA: NEGATIVE
Spec Grav, UA: >=1.03 — AB (ref 1.010–1.025)
Urobilinogen, UA: 2 E.U./dL — AB
pH, UA: 6 (ref 5.0–8.0)

## 2021-08-21 MED ORDER — ONDANSETRON 4 MG PO TBDP: 4.0000 mg | ORAL_TABLET | Freq: Three times a day (TID) | ORAL | 5 refills | Status: DC | PRN

## 2021-08-21 NOTE — Progress Notes (Signed)
Patient states to feeling a chest ache that started yesterday and also states that his ankle is hurting and swells at times. Medication refills: Zofran

## 2021-08-21 NOTE — Progress Notes (Signed)
Established Patient Office Visit  Subjective:  Patient ID: Austin [name], male    DOB: Oct 18, 1990  Age: 31 y.o. MRN: [id number]  CC: No chief complaint on file.   HPI Austin [name] presents for follow-up.  He  has a past medical history of Acute maxillary sinusitis, Eczema, and Sickle cell anemia (HCC) (.).   He is following up for SCD. He has noticed some chest pain that is intermittent. He is rating 7/10. He noticed that this was 45 minutes after taking Aleve on yesterday. It last a few second that last a few seconds. He mild aching. He has taken Aleve in the past. He denies neck or arm pain and nausea. Denies fever, headache, cough, wheezing, shortness of breath, chest pains, abdominal pain, back pain, hip pain, or leg pain. Denies any open wounds, skin irritation. He reports that he has made a change to his diet. He is wanting to get into more natural things.   He is having swelling in his left ankle and foot. He has noticed the swelling for the last 3 weeks. He denies any injury. He is SP left foot surgery; heel surgery. He has not had a recent follow up and he has not contacted their office. He has noticed pain also within the "pinky" toe. He has some limitation with standing. However he continues to work fulltime. Past Medical History:  Diagnosis Date   Acute maxillary sinusitis    Eczema    Sickle cell anemia (HCC) .    Past Surgical History:  Procedure Laterality Date   FOOT SURGERY Left 12/25/2020   WISDOM TOOTH EXTRACTION      Family History  Problem Relation Age of Onset   Sickle cell trait Mother    Diabetes Father    Hypertension Father     Social History   Socioeconomic History   Marital status: Married    Spouse name: Not on file   Number of children: Not on file   Years of education: Not on file   Highest education level: Not on file  Occupational History   Not on file  Tobacco Use   Smoking status: Former    Packs/day: 0.10    Types:  Cigarettes   Smokeless tobacco: Never  Vaping Use   Vaping Use: Never used  Substance and Sexual Activity   Alcohol use: Not Currently   Drug use: Yes    Types: Marijuana    Comment: occasionally   Sexual activity: Yes    Birth control/protection: None    Comment: Married  Other Topics Concern   Not on file  Social History Narrative   Not on file   Social Determinants of Health   Financial Resource Strain: Not on file  Food Insecurity: Not on file  Transportation Needs: Not on file  Physical Activity: Not on file  Stress: Not on file  Social Connections: Not on file  Intimate Partner Violence: Not on file    Outpatient Medications Prior to Visit  Medication Sig Dispense Refill   [employer] NASAL ALLERGY SPRAY 55 MCG/ACT AERO nasal inhaler Place 1-2 sprays into the nose daily as needed (for allergies).     dicyclomine (BENTYL) 20 MG tablet Take 1 tablet (20 mg total) by mouth 2 (two) times daily. 60 tablet 11   ergocalciferol (VITAMIN D2) 1.25 MG (50000 UT) capsule Take 1 capsule (50,000 Units total) by mouth once a week. X 12 weeks. (Patient taking differently: Take 50,000 Units by mouth every Thursday.  X 12 weeks.) 12 capsule 3   loratadine (CLARITIN) 10 MG tablet Take 1 tablet (10 mg total) by mouth daily. (Patient taking differently: Take 10 mg by mouth daily as needed for allergies or rhinitis.) 30 tablet 6   Olopatadine HCl 0.2 % SOLN Apply 1 drop to eye in the morning and at bedtime. 3 mL 11   Oxycodone HCl 10 MG TABS Take 1 tablet (10 mg total) by mouth 4 (four) times daily as needed for up to 15 days. 60 tablet 0   triamcinolone (NASACORT) 55 MCG/ACT AERO nasal inhaler Place 2 sprays into the nose daily. 2 sprays each nostril at night 1 each 4   ondansetron (ZOFRAN-ODT) 4 MG disintegrating tablet TAKE 1 TABLET BY MOUTH EVERY 8 HOURS AS NEEDED FOR UP TO 15 DAYS FOR NAUSEA OR VOMITING. (Patient taking differently: Take 4 mg by mouth See admin instructions. Dissolve 4 mg in the  mouth two times a day and an additional 4 mg once a day as needed for nausea and/or vomiting) 30 tablet 5   No facility-administered medications prior to visit.    No Known Allergies  ROS Review of Systems    Objective:    Physical Exam HENT:     Head: Normocephalic and atraumatic.     Nose: Nose normal.  Cardiovascular:     Rate and Rhythm: Normal rate and regular rhythm.     Pulses: Normal pulses.     Heart sounds: Normal heart sounds.  Pulmonary:     Effort: Pulmonary effort is normal.     Breath sounds: Normal breath sounds.  Musculoskeletal:        General: Swelling present. Normal range of motion.     Cervical back: Normal range of motion.     Right lower leg: No edema.     Left lower leg: No edema.  Skin:    General: Skin is warm and dry.     Capillary Refill: Capillary refill takes less than 2 seconds.  Neurological:     General: No focal deficit present.     Mental Status: He is alert.  Psychiatric:        Mood and Affect: Mood normal.        Behavior: Behavior normal.        Thought Content: Thought content normal.        Judgment: Judgment normal.    BP 107/63    Pulse 86    Resp 16    SpO2 99%  Wt Readings from Last 3 Encounters:  08/04/21 180 lb (81.6 kg)  07/31/21 177 lb 11.1 oz (80.6 kg)  07/30/21 191 lb (86.6 kg)     There are no preventive care reminders to display for this patient.   There are no preventive care reminders to display for this patient.  Lab Results  Component Value Date   TSH 0.662 08/12/2017   Lab Results  Component Value Date   WBC 13.6 (H) 08/04/2021   HGB 13.2 08/04/2021   HCT 36.3 (L) 08/04/2021   MCV 77.7 (L) 08/04/2021   PLT 131 (L) 08/04/2021   Lab Results  Component Value Date   NA 138 08/04/2021   K 3.9 08/04/2021   CO2 27 08/04/2021   GLUCOSE 141 (H) 08/04/2021   BUN 14 08/04/2021   CREATININE 0.96 08/04/2021   BILITOT 1.8 (H) 08/04/2021   ALKPHOS 52 08/04/2021   AST 12 (L) 08/04/2021   ALT 28  08/04/2021   PROT 8.5 (H)  08/04/2021   ALBUMIN 4.9 08/04/2021   CALCIUM 9.7 08/04/2021   ANIONGAP 8 08/04/2021   EGFR 106 11/13/2020   No results found for: CHOL No results found for: HDL No results found for: LDLCALC No results found for: TRIG No results found for: CHOLHDL Lab Results  Component Value Date   HGBA1C 4.2 08/12/2017      Assessment & Plan:   Problem List Items Addressed This Visit       Other   Chronic pain syndrome   Chronic, continuous use of opioids   Other Visit Diagnoses     Sickle-cell disease without crisis ([place])    -  Primary Stable  Labs pending    Relevant Orders   Hgb Fractionation [place]   POCT URINALYSIS DIP (CLINITEK) (Completed)   Localized swelling of left foot     Persistent  Images pending    Relevant Orders   DG Foot Complete Left       Meds ordered this encounter  Medications   ondansetron (ZOFRAN-ODT) 4 MG disintegrating tablet    Sig: Take 1 tablet (4 mg total) by mouth every 8 (eight) hours as needed for nausea or vomiting. TAKE 1 TABLET BY MOUTH EVERY 8 HOURS AS NEEDED    Dispense:  30 tablet    Refill:  5    Order Specific Question:   Supervising Provider    Answer:   Tresa Garter [[id number]]    Follow-up: Return in about 3 months (around 11/19/2021) for Follow up SCD [id number].    Vevelyn Francois, NP

## 2021-08-25 ENCOUNTER — Encounter (HOSPITAL_COMMUNITY): Payer: Self-pay

## 2021-08-25 ENCOUNTER — Emergency Department (HOSPITAL_COMMUNITY)
Admission: EM | Admit: 2021-08-25 | Discharge: 2021-08-25 | Disposition: A | Discharge status: Home / Self Care | Payer: 59 | Attending: Emergency Medicine | Admitting: Emergency Medicine

## 2021-08-25 ENCOUNTER — Ambulatory Visit (HOSPITAL_COMMUNITY)
Admission: RE | Admit: 2021-08-25 | Discharge: 2021-08-25 | Disposition: A | Discharge status: Home / Self Care | Payer: 59 | Source: Ambulatory Visit | Attending: Internal Medicine | Admitting: Internal Medicine

## 2021-08-25 ENCOUNTER — Ambulatory Visit (INDEPENDENT_AMBULATORY_CARE_PROVIDER_SITE_OTHER): Payer: 59

## 2021-08-25 ENCOUNTER — Other Ambulatory Visit: Payer: Self-pay

## 2021-08-25 ENCOUNTER — Encounter (HOSPITAL_COMMUNITY): Payer: Self-pay | Admitting: *Deleted

## 2021-08-25 VITALS — BP 113/74 | HR 73 | Temp 97.9°F | Resp 14

## 2021-08-25 DIAGNOSIS — R231 Pallor: Secondary | ICD-10-CM | POA: Diagnosis not present

## 2021-08-25 DIAGNOSIS — G8929 Other chronic pain: Secondary | ICD-10-CM | POA: Diagnosis not present

## 2021-08-25 DIAGNOSIS — D72829 Elevated white blood cell count, unspecified: Secondary | ICD-10-CM | POA: Insufficient documentation

## 2021-08-25 DIAGNOSIS — R197 Diarrhea, unspecified: Secondary | ICD-10-CM | POA: Diagnosis not present

## 2021-08-25 DIAGNOSIS — M79672 Pain in left foot: Secondary | ICD-10-CM | POA: Diagnosis not present

## 2021-08-25 DIAGNOSIS — R112 Nausea with vomiting, unspecified: Secondary | ICD-10-CM | POA: Insufficient documentation

## 2021-08-25 DIAGNOSIS — D649 Anemia, unspecified: Secondary | ICD-10-CM | POA: Diagnosis not present

## 2021-08-25 DIAGNOSIS — D5700 Hb-SS disease with crisis, unspecified: Secondary | ICD-10-CM | POA: Insufficient documentation

## 2021-08-25 DIAGNOSIS — S9032XS Contusion of left foot, sequela: Secondary | ICD-10-CM

## 2021-08-25 DIAGNOSIS — M79675 Pain in left toe(s): Secondary | ICD-10-CM

## 2021-08-25 DIAGNOSIS — S99922A Unspecified injury of left foot, initial encounter: Secondary | ICD-10-CM | POA: Diagnosis not present

## 2021-08-25 DIAGNOSIS — M549 Dorsalgia, unspecified: Secondary | ICD-10-CM | POA: Insufficient documentation

## 2021-08-25 DIAGNOSIS — R1084 Generalized abdominal pain: Secondary | ICD-10-CM | POA: Diagnosis not present

## 2021-08-25 DIAGNOSIS — R110 Nausea: Secondary | ICD-10-CM | POA: Diagnosis not present

## 2021-08-25 DIAGNOSIS — D57219 Sickle-cell/Hb-C disease with crisis, unspecified: Secondary | ICD-10-CM | POA: Diagnosis not present

## 2021-08-25 DIAGNOSIS — R9431 Abnormal electrocardiogram [ECG] [EKG]: Secondary | ICD-10-CM | POA: Diagnosis not present

## 2021-08-25 LAB — CBC WITH DIFFERENTIAL/PLATELET
Abs Immature Granulocytes: 0.06 10*3/uL (ref 0.00–0.07)
Basophils Absolute: 0 10*3/uL (ref 0.0–0.1)
Basophils Relative: 0 %
Eosinophils Absolute: 0.1 10*3/uL (ref 0.0–0.5)
Eosinophils Relative: 1 %
HCT: 35 % — ABNORMAL LOW (ref 39.0–52.0)
Hemoglobin: 12.8 g/dL — ABNORMAL LOW (ref 13.0–17.0)
Immature Granulocytes: 1 %
Lymphocytes Relative: 21 %
Lymphs Abs: 2.5 10*3/uL (ref 0.7–4.0)
MCH: 28.1 pg (ref 26.0–34.0)
MCHC: 36.6 g/dL — ABNORMAL HIGH (ref 30.0–36.0)
MCV: 76.9 fL — ABNORMAL LOW (ref 80.0–100.0)
Monocytes Absolute: 0.9 10*3/uL (ref 0.1–1.0)
Monocytes Relative: 7 %
Neutro Abs: 8.7 10*3/uL — ABNORMAL HIGH (ref 1.7–7.7)
Neutrophils Relative %: 70 %
Platelets: 125 10*3/uL — ABNORMAL LOW (ref 150–400)
RBC: 4.55 MIL/uL (ref 4.22–5.81)
RDW: 14.4 % (ref 11.5–15.5)
WBC: 12.3 10*3/uL — ABNORMAL HIGH (ref 4.0–10.5)
nRBC: 0.3 % — ABNORMAL HIGH (ref 0.0–0.2)

## 2021-08-25 LAB — COMPREHENSIVE METABOLIC PANEL
ALT: 21 U/L (ref 0–44)
AST: 12 U/L — ABNORMAL LOW (ref 15–41)
Albumin: 4.7 g/dL (ref 3.5–5.0)
Alkaline Phosphatase: 51 U/L (ref 38–126)
Anion gap: 7 (ref 5–15)
BUN: 11 mg/dL (ref 6–20)
CO2: 29 mmol/L (ref 22–32)
Calcium: 9.2 mg/dL (ref 8.9–10.3)
Chloride: 102 mmol/L (ref 98–111)
Creatinine, Ser: 0.93 mg/dL (ref 0.61–1.24)
GFR, Estimated: >60 mL/min (ref >60)
Glucose, Bld: 119 mg/dL — ABNORMAL HIGH (ref 70–99)
Potassium: 3.7 mmol/L (ref 3.5–5.1)
Sodium: 138 mmol/L (ref 135–145)
Total Bilirubin: 1.3 mg/dL — ABNORMAL HIGH (ref 0.3–1.2)
Total Protein: 8.2 g/dL — ABNORMAL HIGH (ref 6.5–8.1)

## 2021-08-25 LAB — RETICULOCYTES
Immature Retic Fract: 32.5 % — ABNORMAL HIGH (ref 2.3–15.9)
RBC.: 4.57 MIL/uL (ref 4.22–5.81)
Retic Count, Absolute: 213.4 10*3/uL — ABNORMAL HIGH (ref 19.0–186.0)
Retic Ct Pct: 4.7 % — ABNORMAL HIGH (ref 0.4–3.1)

## 2021-08-25 MED ORDER — HALOPERIDOL LACTATE 5 MG/ML IJ SOLN
5.0000 mg | Freq: Once | INTRAMUSCULAR | Status: AC
  Administered 2021-08-25: 5 mg via INTRAVENOUS
  Filled 2021-08-25: qty 1

## 2021-08-25 MED ORDER — SODIUM CHLORIDE 0.9 % IV SOLN
12.5000 mg | Freq: Once | INTRAVENOUS | Status: DC
  Filled 2021-08-25: qty 0.25

## 2021-08-25 MED ORDER — HYDROMORPHONE HCL 2 MG/ML IJ SOLN
2.0000 mg | INTRAMUSCULAR | Status: AC
  Administered 2021-08-25: 2 mg via INTRAVENOUS
  Filled 2021-08-25: qty 1

## 2021-08-25 MED ORDER — SODIUM CHLORIDE 0.45 % IV SOLN: INTRAVENOUS | Status: DC

## 2021-08-25 MED ORDER — HYDROMORPHONE HCL 2 MG/ML IJ SOLN: 2.0000 mg | INTRAMUSCULAR | Status: AC

## 2021-08-25 MED ORDER — OXYCODONE HCL 5 MG PO TABS
10.0000 mg | ORAL_TABLET | Freq: Once | ORAL | Status: AC
  Administered 2021-08-25: 10 mg via ORAL
  Filled 2021-08-25: qty 2

## 2021-08-25 MED ORDER — SODIUM CHLORIDE 0.9 % IV BOLUS
1000.0000 mL | Freq: Once | INTRAVENOUS | Status: AC
Start: 2021-08-25 — End: 2021-08-25
  Administered 2021-08-25: 1000 mL via INTRAVENOUS

## 2021-08-25 MED ORDER — ONDANSETRON HCL 4 MG/2ML IJ SOLN: 4.0000 mg | INTRAMUSCULAR | Status: DC | PRN

## 2021-08-25 MED ORDER — MELOXICAM 7.5 MG PO TABS: 7.5000 mg | ORAL_TABLET | Freq: Every day | ORAL | 0 refills | Status: AC

## 2021-08-25 MED ORDER — ONDANSETRON HCL 4 MG/2ML IJ SOLN
4.0000 mg | Freq: Once | INTRAMUSCULAR | Status: AC
  Administered 2021-08-25: 4 mg via INTRAVENOUS
  Filled 2021-08-25: qty 2

## 2021-08-25 NOTE — ED Notes (Signed)
ED RN went in room to check on pt. Pt is asleep at this time.

## 2021-08-25 NOTE — ED Triage Notes (Signed)
Pt presents with left foot pain and swelling after hitting his foot in November.

## 2021-08-25 NOTE — Discharge Instructions (Addendum)
Your x-ray is negative for acute fracture or bony abnormality However, given your significant podiatric history, I would recommend follow-up with your podiatrist for possible advanced imaging. In the meantime I have called in a medication for you to try which is good for chronic orthopedic issues.  Please take this once a day in the morning with food.

## 2021-08-25 NOTE — ED Notes (Signed)
ED Provider at bedside. 

## 2021-08-25 NOTE — ED Notes (Signed)
Pt states understanding of dc instructions, importance of follow up. Pt denies questions or concerns and declined transportation assistance upon dc. Pt ambulated w/ a steady gait w/o need for assistance. No belongings left in room upon dc. ? ?

## 2021-08-25 NOTE — ED Provider Notes (Signed)
[facility]    CSN: [id number] Arrival date & time: 08/25/21  1749      History   Chief Complaint Chief Complaint  Patient presents with   Foot Injury    HPI Austin [name] is a 31 y.o. male.   31 year old male who presents today with concerns of left foot pain.  He apparently has an extensive history of left foot issues.  He reports having had a partial tear of his plantar fascia in spring 2022, and performed a surgical intervention around June 2022.  Patient states he had a nerve block performed during the procedure which caused his leg to be permanently numb until mid December.  Patient states that in mid November while his leg was still numb, he heard his child choking, and hopped out of bed to run to see what the problem was.  In his haste, he states he smacked his leg on his bed.  He reports that his leg was numb after the initial injury, and did not start feeling pain to his lateral foot, primarily in his fifth digit until mid December 2022.  He states he has not seen his podiatrist for this injury.  He is not currently taking any medication for his toe pain.  He was however actually seen earlier today at the emergency room for a sickle cell crisis.  He had fentanyl and Dilaudid.  His last dose of Dilaudid was around 6:30 AM this morning.  Patient states it has not helped with his toe pain.  Patient otherwise denies any acute complaints.   Foot Injury  Past Medical History:  Diagnosis Date   Acute maxillary sinusitis    Eczema    Sickle cell anemia ([place]) .    Patient Active Problem List   Diagnosis Date Noted   Hypokalemia 03/05/2021   Anemia of chronic disease 03/05/2021   Vitamin D deficiency 11/16/2020   Bilateral lower extremity pain 10/17/2019   Sickle cell anemia without crisis ([place]) 09/20/2019   Chronic pain syndrome 09/20/2019   Nausea 09/20/2019   Chronic, continuous use of opioids 09/20/2019   Medication management 03/20/2019   Bilious  vomiting without nausea    Viral gastroenteritis 01/23/2019   Nausea vomiting and diarrhea    Generalized abdominal pain    Dehydration    Sickle cell pain crisis ([place]) 05/18/2018   Acute sickle cell crisis ([place]) 12/15/2017   Thrombocytopenia ([place]) 12/15/2017   Splenomegaly 12/15/2017   Sickle cell anemia with crisis ([place]) 12/15/2017   Spleen enlarged 08/18/2017    Past Surgical History:  Procedure Laterality Date   FOOT SURGERY Left 12/25/2020   WISDOM TOOTH EXTRACTION         Home Medications    Prior to Admission medications   Medication Sig Start Date End Date Taking? Authorizing Provider  meloxicam (MOBIC) 7.5 MG tablet Take 1 tablet (7.5 mg total) by mouth daily for 7 days. 08/25/21 09/01/21 Yes Madline Oesterling L, PA  [employer] NASAL ALLERGY SPRAY 55 MCG/ACT AERO nasal inhaler Place 1-2 sprays into the nose daily as needed (for allergies). Patient not taking: Reported on 08/25/2021 07/21/21   [provider]  dicyclomine (BENTYL) 20 MG tablet Take 1 tablet (20 mg total) by mouth 2 (two) times daily. 03/23/21 03/23/22  Vevelyn Francois, NP  ergocalciferol (VITAMIN D2) 1.25 MG (50000 UT) capsule Take 1 capsule (50,000 Units total) by mouth once a week. X 12 weeks. Patient taking differently: Take 50,000 Units by mouth every Thursday. X  12 weeks. 05/27/21 05/27/22  Barbette Merino, NP  loratadine (CLARITIN) 10 MG tablet Take 1 tablet (10 mg total) by mouth daily. Patient taking differently: Take 10 mg by mouth daily as needed for allergies or rhinitis. 11/13/20   Barbette Merino, NP  Olopatadine HCl 0.2 % SOLN Apply 1 drop to eye in the morning and at bedtime. 11/13/20 11/13/21  Barbette Merino, NP  ondansetron (ZOFRAN-ODT) 4 MG disintegrating tablet Take 1 tablet (4 mg total) by mouth every 8 (eight) hours as needed for nausea or vomiting. TAKE 1 TABLET BY MOUTH EVERY 8 HOURS AS NEEDED 08/21/21   Barbette Merino, NP  Oxycodone HCl 10 MG TABS Take 1 tablet (10 mg total) by mouth 4 (four)  times daily as needed for up to 15 days. 08/17/21 09/01/21  Barbette Merino, NP  triamcinolone (NASACORT) 55 MCG/ACT AERO nasal inhaler Place 2 sprays into the nose daily. 2 sprays each nostril at night 04/27/21   Drema Halon, MD    Family History Family History  Problem Relation Age of Onset   Sickle cell trait Mother    Diabetes Father    Hypertension Father     Social History Social History   Tobacco Use   Smoking status: Former    Packs/day: 0.10    Types: Cigarettes   Smokeless tobacco: Never  Vaping Use   Vaping Use: Never used  Substance Use Topics   Alcohol use: Not Currently   Drug use: Yes    Types: Marijuana    Comment: occasionally     Allergies   Patient has no known allergies.   Review of Systems Review of Systems  Musculoskeletal:  Positive for arthralgias (L foot pain).  All other systems reviewed and are negative.   Physical Exam Triage Vital Signs ED Triage Vitals  Enc Vitals Group     BP 08/25/21 1814 113/74     Pulse Rate 08/25/21 1814 73     Resp 08/25/21 1814 14     Temp 08/25/21 1814 97.9 F (36.6 C)     Temp Source 08/25/21 1814 Oral     SpO2 08/25/21 1814 96 %     Weight --      Height --      Head Circumference --      Peak Flow --      Pain Score 08/25/21 1816 1     Pain Loc --      Pain Edu? --      Excl. in GC? --    No data found.  Updated Vital Signs BP 113/74 (BP Location: Right Arm)    Pulse 73    Temp 97.9 F (36.6 C) (Oral)    Resp 14    SpO2 96%   Visual Acuity Right Eye Distance:   Left Eye Distance:   Bilateral Distance:    Right Eye Near:   Left Eye Near:    Bilateral Near:     Physical Exam Vitals and nursing note reviewed.  Constitutional:      General: He is not in acute distress.    Appearance: Normal appearance. He is normal weight. He is not ill-appearing, toxic-appearing or diaphoretic.  HENT:     Head: Normocephalic.  Musculoskeletal:        General: No swelling, tenderness (Patient  had reported tenderness to palpation, however stated pain level is not consistent with physical exam findings), deformity or signs of injury. Normal range of motion.  Right lower leg: No edema.     Left lower leg: No edema.     Comments: Patient currently wearing a walking boot on the left foot  Skin:    General: Skin is warm.     Coloration: Skin is not jaundiced.     Findings: No bruising, erythema or rash.  Neurological:     General: No focal deficit present.     Mental Status: He is alert.     Sensory: No sensory deficit.     Motor: No weakness.     Coordination: Coordination normal.     UC Treatments / Results  Labs (all labs ordered are listed, but only abnormal results are displayed) Labs Reviewed - No data to display  EKG   Radiology DG Foot Complete Left  Result Date: 08/25/2021 CLINICAL DATA:  Injury of foot. EXAM: LEFT FOOT - COMPLETE 3+ VIEW COMPARISON:  Left foot x-ray 11/24/2020. FINDINGS: There is no evidence of fracture or dislocation. There is no evidence of arthropathy or other focal bone abnormality. Soft tissues are unremarkable. IMPRESSION: Negative. Electronically Signed   By: Ronney Asters M.D.   On: 08/25/2021 18:37    Procedures Procedures (including critical care time)  Medications Ordered in UC Medications - No data to display  Initial Impression / Assessment and Plan / UC Course  I have reviewed the triage vital signs and the nursing notes.  Pertinent labs & imaging results that were available during my care of the patient were reviewed by me and considered in my medical decision making (see chart for details).     Chronic left fifth toe pain -patient reports not having been seen for this since the onset in November, however I see an office visit from December 12 by his podiatrist in which patient claimed no pain or discomfort and significant improvement.  His x-ray is unremarkable.  He just received fentanyl and Dilaudid 12 hours ago.  I will  give him meloxicam to take on an as-needed basis, but ultimately any further evaluation or work-up should come from his podiatrist. Contusion -overall, normal exam of his left foot today.  Patient wearing walking boot he states per the direction of his sickle cell doctor.  Again, recommended follow-up with podiatry.  Final Clinical Impressions(s) / UC Diagnoses   Final diagnoses:  Chronic toe pain, left foot  Contusion of left foot, sequela     Discharge Instructions      Your x-ray is negative for acute fracture or bony abnormality However, given your significant podiatric history, I would recommend follow-up with your podiatrist for possible advanced imaging. In the meantime I have called in a medication for you to try which is good for chronic orthopedic issues.  Please take this once a day in the morning with food.     ED Prescriptions     Medication Sig Dispense Auth. Provider   meloxicam (MOBIC) 7.5 MG tablet Take 1 tablet (7.5 mg total) by mouth daily for 7 days. 7 tablet Theta Leaf L, PA      PDMP not reviewed this encounter.   Chaney Malling, [place] 08/25/21 2155

## 2021-08-25 NOTE — ED Provider Notes (Signed)
[name] Provider Note   CSN: [id number] Arrival date & time: 08/25/21  0125     History  Chief Complaint  Patient presents with   Sickle Cell Pain Crisis    Austin [name] is a [age] y.o. male.  The history is provided by the patient and medical records. No language interpreter was used.  Sickle Cell Pain Crisis  31 year old male with history of sickle cell disease and history of marijuana use who arrived via EMS from home presents complaining of abdominal pain.  Patient developed pain in his abdomen with chills, nausea, and vomiting and backache as well as some diarrhea that started earlier today.  Pain has been persistent, he took some Zofran at home with some improvement.  EMS report when they arrived patient was cool and clammy and patient did receive 100 mcg of fentanyl PTA along with IV fluid.  Patient states his symptoms felt similar to prior sickle cell flare.  He denies fever chest pain shortness of breath productive cough or dysuria.  Denies any recent sick contact.  Last marijuana use was yesterday.  Home Medications Prior to Admission medications   Medication Sig Start Date End Date Taking? Authorizing Provider  [employer] NASAL ALLERGY SPRAY 55 MCG/ACT AERO nasal inhaler Place 1-2 sprays into the nose daily as needed (for allergies). 07/21/21   [provider]  dicyclomine (BENTYL) 20 MG tablet Take 1 tablet (20 mg total) by mouth 2 (two) times daily. 03/23/21 03/23/22  Barbette MerinoKing, Crystal M, NP  ergocalciferol (VITAMIN D2) 1.25 MG (50000 UT) capsule Take 1 capsule (50,000 Units total) by mouth once a week. X 12 weeks. Patient taking differently: Take 50,000 Units by mouth every Thursday. X 12 weeks. 05/27/21 05/27/22  Barbette MerinoKing, Crystal M, NP  loratadine (CLARITIN) 10 MG tablet Take 1 tablet (10 mg total) by mouth daily. Patient taking differently: Take 10 mg by mouth daily as needed for allergies or rhinitis. 11/13/20   Barbette MerinoKing, Crystal M, NP   Olopatadine HCl 0.2 % SOLN Apply 1 drop to eye in the morning and at bedtime. 11/13/20 11/13/21  Barbette MerinoKing, Crystal M, NP  ondansetron (ZOFRAN-ODT) 4 MG disintegrating tablet Take 1 tablet (4 mg total) by mouth every 8 (eight) hours as needed for nausea or vomiting. TAKE 1 TABLET BY MOUTH EVERY 8 HOURS AS NEEDED 08/21/21   Barbette MerinoKing, Crystal M, NP  Oxycodone HCl 10 MG TABS Take 1 tablet (10 mg total) by mouth 4 (four) times daily as needed for up to 15 days. 08/17/21 09/01/21  Barbette MerinoKing, Crystal M, NP  triamcinolone (NASACORT) 55 MCG/ACT AERO nasal inhaler Place 2 sprays into the nose daily. 2 sprays each nostril at night 04/27/21   Drema HalonNewman, Christopher E, MD      Allergies    Patient has no known allergies.    Review of Systems   Review of Systems  All other systems reviewed and are negative.  Physical Exam Updated Vital Signs BP 114/73 (BP Location: Right Arm)    Pulse 73    Temp 98.1 F (36.7 C) (Oral)    Resp 16    Ht 5\' 7"  (1.702 m)    Wt 85.7 kg    SpO2 100%    BMI 29.60 kg/m  Physical Exam Vitals and nursing note reviewed.  Constitutional:      General: He is not in acute distress.    Appearance: He is well-developed.  HENT:     Head: Atraumatic.  Eyes:  Conjunctiva/sclera: Conjunctivae normal.  Cardiovascular:     Rate and Rhythm: Normal rate and regular rhythm.     Pulses: Normal pulses.     Heart sounds: Normal heart sounds.  Pulmonary:     Effort: Pulmonary effort is normal.     Breath sounds: Normal breath sounds.  Abdominal:     Palpations: Abdomen is soft.     Tenderness: There is abdominal tenderness (Mild diffuse abdominal tenderness without guarding or rebound tenderness.  Negative Murphy sign, no pain at McBurney's point.).  Musculoskeletal:     Cervical back: Neck supple.  Skin:    Findings: No rash.  Neurological:     Mental Status: He is alert.    ED Results / Procedures / Treatments   Labs (all labs ordered are listed, but only abnormal results are displayed) Labs  Reviewed  COMPREHENSIVE METABOLIC PANEL - Abnormal; Notable for the following components:      Result Value   Glucose, Bld 119 (*)    Total Protein 8.2 (*)    AST 12 (*)    Total Bilirubin 1.3 (*)    All other components within normal limits  CBC WITH DIFFERENTIAL/PLATELET - Abnormal; Notable for the following components:   WBC 12.3 (*)    Hemoglobin 12.8 (*)    HCT 35.0 (*)    MCV 76.9 (*)    MCHC 36.6 (*)    Platelets 125 (*)    nRBC 0.3 (*)    Neutro Abs 8.7 (*)    All other components within normal limits  RETICULOCYTES - Abnormal; Notable for the following components:   Retic Ct Pct 4.7 (*)    Retic Count, Absolute 213.4 (*)    Immature Retic Fract 32.5 (*)    All other components within normal limits    EKG EKG Interpretation  Date/Time:  Tuesday August 25 2021 04:19:41 EST Ventricular Rate:  69 PR Interval:  131 QRS Duration: 94 QT Interval:  365 QTC Calculation: 391 R Axis:   -6 Text Interpretation: Sinus rhythm LVH by voltage No significant change since last tracing Confirmed by Ripley Fraise [id number]) on 08/25/2021 4:21:25 AM  Radiology No results found.  Procedures Procedures    Medications Ordered in ED Medications  HYDROmorphone (DILAUDID) injection 2 mg (has no administration in time range)  HYDROmorphone (DILAUDID) injection 2 mg (has no administration in time range)  diphenhydrAMINE (BENADRYL) 12.5 mg in sodium chloride 0.9 % 50 mL IVPB (has no administration in time range)  ondansetron (ZOFRAN) injection 4 mg (has no administration in time range)  0.45 % sodium chloride infusion (has no administration in time range)  sodium chloride 0.9 % bolus 1,000 mL (1,000 mLs Intravenous New Bag/Given 08/25/21 0431)  ondansetron (ZOFRAN) injection 4 mg (4 mg Intravenous Given 08/25/21 0428)  haloperidol lactate (HALDOL) injection 5 mg (5 mg Intravenous Given 08/25/21 0429)  HYDROmorphone (DILAUDID) injection 2 mg (2 mg Intravenous Given 08/25/21 0519)    ED  Course/ Medical Decision Making/ A&P                           Medical Decision Making Problems Addressed: Nausea vomiting and diarrhea: acute illness or injury    Details: -IVF and antinausea medication given Sickle cell anemia with pain [facility]): acute illness or injury    Details: -pt receiving medications for pain under sickle cell pain protocol  Amount and/or Complexity of Data Reviewed External Data Reviewed: labs, ECG and notes. Labs: ordered.  Decision-making details documented in ED Course.  Risk Prescription drug management. Parenteral controlled substances. Drug therapy requiring intensive monitoring for toxicity.   BP 129/79 (BP Location: Left Arm)    Pulse 70    Temp 98.9 F (37.2 C) (Oral)    Resp 18    Ht 5\' 7"  (1.702 m)    Wt 85.7 kg    SpO2 100%    BMI 29.60 kg/m   4:14 AM Patient with history of sickle cell disease who is here with complaints of abdominal pain nausea and vomiting and states that it felt similar to prior sickle cell crisis.  However, he also has history of marijuana use and last use was yesterday.  His symptoms is more suggestive of cannabinoid hyperemesis syndrome less likely to be sickle cell crisis.  Labs was obtained and independently reviewed interpreted by me.  Patient does have an elevated count of 12.3 likely stress demargination.  His hemoglobin is 12.8.  He does have elevated retic count.  Since patient has abdominal pain differential diagnosis includes cholecystitis, appendicitis, colitis, diverticulitis, pancreatitis however he has a fairly benign abdominal exam I have low suspicion for acute abdominal pathology at this time.  6:28 AM EKG obtained and independently reviewed and interpreted by me, which showed no evidence of prolonged QT.  Patient received Haldol report no significant improvement of symptoms.  I have initiated sickle cell pain protocol to provide patient with IV fluid and opiate pain medication as appropriate.  6:29 AM Pt sign  out to oncoming provider who will reassess pt and determine disposition once he received his appropriate treatment under our protocol         Final Clinical Impression(s) / ED Diagnoses Final diagnoses:  Sickle cell anemia with pain (HCC)  Nausea vomiting and diarrhea    Rx / DC Orders ED Discharge Orders     None         Domenic Moras, PA-C 08/25/21 [id number]    Ripley Fraise, MD 08/26/21 [phone]

## 2021-08-25 NOTE — ED Triage Notes (Signed)
Pt arrives via [facility] from home. Per report, Woke up with abdominal and back pain. Nausea. Took home zofran. Similar to sickle cell crisis. Cool and clammy on ems arrival, 7/10 pain, gave 100 fentanyl in an IV in his right hand, given 300 NS. Denies SOB, nausea improved. Hr 70, 126/72, 100% RA.

## 2021-08-25 NOTE — ED Triage Notes (Signed)
Lower back pain and lower abdominal pain, associated with nausea and diarrhea.

## 2021-08-29 ENCOUNTER — Other Ambulatory Visit: Payer: Self-pay

## 2021-08-29 ENCOUNTER — Emergency Department (HOSPITAL_COMMUNITY): Payer: 59

## 2021-08-29 ENCOUNTER — Emergency Department (HOSPITAL_COMMUNITY)
Admission: EM | Admit: 2021-08-29 | Discharge: 2021-08-29 | Disposition: A | Discharge status: Home / Self Care | Payer: 59 | Attending: Emergency Medicine | Admitting: Emergency Medicine

## 2021-08-29 ENCOUNTER — Encounter (HOSPITAL_COMMUNITY): Payer: Self-pay

## 2021-08-29 DIAGNOSIS — R1111 Vomiting without nausea: Secondary | ICD-10-CM | POA: Diagnosis not present

## 2021-08-29 DIAGNOSIS — R509 Fever, unspecified: Secondary | ICD-10-CM | POA: Diagnosis not present

## 2021-08-29 DIAGNOSIS — R112 Nausea with vomiting, unspecified: Secondary | ICD-10-CM | POA: Insufficient documentation

## 2021-08-29 DIAGNOSIS — R61 Generalized hyperhidrosis: Secondary | ICD-10-CM | POA: Diagnosis not present

## 2021-08-29 DIAGNOSIS — R109 Unspecified abdominal pain: Secondary | ICD-10-CM | POA: Insufficient documentation

## 2021-08-29 DIAGNOSIS — M549 Dorsalgia, unspecified: Secondary | ICD-10-CM | POA: Diagnosis not present

## 2021-08-29 DIAGNOSIS — D5700 Hb-SS disease with crisis, unspecified: Secondary | ICD-10-CM | POA: Insufficient documentation

## 2021-08-29 LAB — COMPREHENSIVE METABOLIC PANEL
ALT: 27 U/L (ref 0–44)
AST: 11 U/L — ABNORMAL LOW (ref 15–41)
Albumin: 4.8 g/dL (ref 3.5–5.0)
Alkaline Phosphatase: 52 U/L (ref 38–126)
Anion gap: 5 (ref 5–15)
BUN: 10 mg/dL (ref 6–20)
CO2: 28 mmol/L (ref 22–32)
Calcium: 9.8 mg/dL (ref 8.9–10.3)
Chloride: 101 mmol/L (ref 98–111)
Creatinine, Ser: 0.98 mg/dL (ref 0.61–1.24)
GFR, Estimated: >60 mL/min (ref >60)
Glucose, Bld: 145 mg/dL — ABNORMAL HIGH (ref 70–99)
Potassium: 4.9 mmol/L (ref 3.5–5.1)
Sodium: 134 mmol/L — ABNORMAL LOW (ref 135–145)
Total Bilirubin: 1.5 mg/dL — ABNORMAL HIGH (ref 0.3–1.2)
Total Protein: 8.4 g/dL — ABNORMAL HIGH (ref 6.5–8.1)

## 2021-08-29 LAB — CBC WITH DIFFERENTIAL/PLATELET
Abs Immature Granulocytes: 0.08 10*3/uL — ABNORMAL HIGH (ref 0.00–0.07)
Basophils Absolute: 0 10*3/uL (ref 0.0–0.1)
Basophils Relative: 0 %
Eosinophils Absolute: 0.1 10*3/uL (ref 0.0–0.5)
Eosinophils Relative: 0 %
HCT: 38.7 % — ABNORMAL LOW (ref 39.0–52.0)
Hemoglobin: 14 g/dL (ref 13.0–17.0)
Immature Granulocytes: 1 %
Lymphocytes Relative: 9 %
Lymphs Abs: 1.4 10*3/uL (ref 0.7–4.0)
MCH: 27.9 pg (ref 26.0–34.0)
MCHC: 36.2 g/dL — ABNORMAL HIGH (ref 30.0–36.0)
MCV: 77.1 fL — ABNORMAL LOW (ref 80.0–100.0)
Monocytes Absolute: 0.8 10*3/uL (ref 0.1–1.0)
Monocytes Relative: 5 %
Neutro Abs: 13.8 10*3/uL — ABNORMAL HIGH (ref 1.7–7.7)
Neutrophils Relative %: 85 %
Platelets: 140 10*3/uL — ABNORMAL LOW (ref 150–400)
RBC: 5.02 MIL/uL (ref 4.22–5.81)
RDW: 14.4 % (ref 11.5–15.5)
WBC: 16.3 10*3/uL — ABNORMAL HIGH (ref 4.0–10.5)
nRBC: 0.1 % (ref 0.0–0.2)

## 2021-08-29 LAB — RETICULOCYTES
Immature Retic Fract: 27.5 % — ABNORMAL HIGH (ref 2.3–15.9)
RBC.: 5.03 MIL/uL (ref 4.22–5.81)
Retic Count, Absolute: 182.6 10*3/uL (ref 19.0–186.0)
Retic Ct Pct: 3.6 % — ABNORMAL HIGH (ref 0.4–3.1)

## 2021-08-29 LAB — LIPASE, BLOOD: Lipase: 25 U/L (ref 11–51)

## 2021-08-29 MED ORDER — SODIUM CHLORIDE 0.45 % IV SOLN: INTRAVENOUS | Status: DC

## 2021-08-29 MED ORDER — IOHEXOL 300 MG/ML  SOLN
100.0000 mL | Freq: Once | INTRAMUSCULAR | Status: AC | PRN
  Administered 2021-08-29: 100 mL via INTRAVENOUS

## 2021-08-29 MED ORDER — ONDANSETRON HCL 4 MG/2ML IJ SOLN
4.0000 mg | INTRAMUSCULAR | Status: DC | PRN
  Administered 2021-08-29: 4 mg via INTRAVENOUS
  Filled 2021-08-29: qty 2

## 2021-08-29 MED ORDER — HYDROMORPHONE HCL 1 MG/ML IJ SOLN
1.0000 mg | INTRAMUSCULAR | Status: AC
  Administered 2021-08-29: 1 mg via INTRAVENOUS
  Filled 2021-08-29: qty 1

## 2021-08-29 MED ORDER — HYDROMORPHONE HCL 1 MG/ML IJ SOLN
0.5000 mg | INTRAMUSCULAR | Status: AC
  Administered 2021-08-29: 0.5 mg via INTRAVENOUS
  Filled 2021-08-29: qty 1

## 2021-08-29 NOTE — ED Provider Notes (Signed)
[facility]-EMERGENCY DEPT Provider Note   CSN: [id number] Arrival date & time: 08/29/21  0931     History  No chief complaint on file.   [place] [name] is a 31 y.o. male.  31 year old male with prior medical history as detailed below presents for evaluation.  Patient with longstanding history of sickle cell.  Patient presents complaining of sickle cell pain.  He has described pain is typical for his sickle cell crises.  Patient reports onset of symptoms over the last 2 to 3 hours.  He took his home medications.  He also complains of nausea and vomiting.  He denies fever.  The history is provided by the patient.  Illness Location:  Sickle cell pain Severity:  Moderate Onset quality:  Gradual Duration:  3 hours Timing:  Constant Progression:  Worsening Chronicity:  New     Home Medications Prior to Admission medications   Medication Sig Start Date End Date Taking? Authorizing Provider  [employer] NASAL ALLERGY SPRAY 55 MCG/ACT AERO nasal inhaler Place 1-2 sprays into the nose daily as needed (for allergies). Patient not taking: Reported on 08/25/2021 07/21/21   [provider]  dicyclomine (BENTYL) 20 MG tablet Take 1 tablet (20 mg total) by mouth 2 (two) times daily. 03/23/21 03/23/22  Barbette Merino, NP  ergocalciferol (VITAMIN D2) 1.25 MG (50000 UT) capsule Take 1 capsule (50,000 Units total) by mouth once a week. X 12 weeks. Patient taking differently: Take 50,000 Units by mouth every Thursday. X 12 weeks. 05/27/21 05/27/22  Barbette Merino, NP  loratadine (CLARITIN) 10 MG tablet Take 1 tablet (10 mg total) by mouth daily. Patient taking differently: Take 10 mg by mouth daily as needed for allergies or rhinitis. 11/13/20   Barbette Merino, NP  meloxicam (MOBIC) 7.5 MG tablet Take 1 tablet (7.5 mg total) by mouth daily for 7 days. 08/25/21 09/01/21  Crain, Whitney L, PA  Olopatadine HCl 0.2 % SOLN Apply 1 drop to eye in the morning and at bedtime.  11/13/20 11/13/21  Barbette Merino, NP  ondansetron (ZOFRAN-ODT) 4 MG disintegrating tablet Take 1 tablet (4 mg total) by mouth every 8 (eight) hours as needed for nausea or vomiting. TAKE 1 TABLET BY MOUTH EVERY 8 HOURS AS NEEDED 08/21/21   Barbette Merino, NP  Oxycodone HCl 10 MG TABS Take 1 tablet (10 mg total) by mouth 4 (four) times daily as needed for up to 15 days. 08/17/21 09/01/21  Barbette Merino, NP  triamcinolone (NASACORT) 55 MCG/ACT AERO nasal inhaler Place 2 sprays into the nose daily. 2 sprays each nostril at night 04/27/21   Drema Halon, MD      Allergies    Patient has no known allergies.    Review of Systems   Review of Systems  All other systems reviewed and are negative.  Physical Exam Updated Vital Signs BP 130/86    Pulse 83    Temp (!) 97.5 F (36.4 C) (Oral)    Resp 16    Ht 5\' 7"  (1.702 m)    Wt 83.9 kg    SpO2 100%    BMI 28.98 kg/m  Physical Exam Vitals and nursing note reviewed.  Constitutional:      General: He is not in acute distress.    Appearance: Normal appearance. He is well-developed.  HENT:     Head: Normocephalic and atraumatic.  Eyes:     Conjunctiva/sclera: Conjunctivae normal.     Pupils:  Pupils are equal, round, and reactive to light.  Cardiovascular:     Rate and Rhythm: Normal rate and regular rhythm.     Heart sounds: Normal heart sounds.  Pulmonary:     Effort: Pulmonary effort is normal. No respiratory distress.     Breath sounds: Normal breath sounds.  Abdominal:     General: There is no distension.     Palpations: Abdomen is soft.     Tenderness: There is no abdominal tenderness.  Musculoskeletal:        General: No deformity. Normal range of motion.     Cervical back: Normal range of motion and neck supple.  Skin:    General: Skin is warm and dry.  Neurological:     General: No focal deficit present.     Mental Status: He is alert and oriented to person, place, and time.    ED Results / Procedures / Treatments    Labs (all labs ordered are listed, but only abnormal results are displayed) Labs Reviewed  RETICULOCYTES - Abnormal; Notable for the following components:      Result Value   Retic Ct Pct 3.6 (*)    Immature Retic Fract 27.5 (*)    All other components within normal limits  CBC WITH DIFFERENTIAL/PLATELET - Abnormal; Notable for the following components:   WBC 16.3 (*)    HCT 38.7 (*)    MCV 77.1 (*)    MCHC 36.2 (*)    Platelets 140 (*)    Neutro Abs 13.8 (*)    Abs Immature Granulocytes 0.08 (*)    All other components within normal limits  COMPREHENSIVE METABOLIC PANEL - Abnormal; Notable for the following components:   Sodium 134 (*)    Glucose, Bld 145 (*)    Total Protein 8.4 (*)    AST 11 (*)    Total Bilirubin 1.5 (*)    All other components within normal limits  LIPASE, BLOOD  URINALYSIS, ROUTINE W REFLEX MICROSCOPIC    EKG None  Radiology CT ABDOMEN PELVIS W CONTRAST  Result Date: 08/29/2021 CLINICAL DATA:  Abdominal pain.  Sickle cell crisis. EXAM: CT ABDOMEN AND PELVIS WITH CONTRAST TECHNIQUE: Multidetector CT imaging of the abdomen and pelvis was performed using the standard protocol following bolus administration of intravenous contrast. RADIATION DOSE REDUCTION: This exam was performed according to the departmental dose-optimization program which includes automated exposure control, adjustment of the mA and/or kV according to patient size and/or use of iterative reconstruction technique. CONTRAST:  170mL OMNIPAQUE IOHEXOL 300 MG/ML  SOLN COMPARISON:  None. FINDINGS: Lower chest: Dependent atelectasis noted in the lung bases. Right lower lobe nodule seen on the previous study not evident on today's exam. Hepatobiliary: No suspicious focal abnormality within the liver parenchyma. There is no evidence for gallstones, gallbladder wall thickening, or pericholecystic fluid. No intrahepatic or extrahepatic biliary dilation. Pancreas: No focal mass lesion. No dilatation of  the main duct. No intraparenchymal cyst. No peripancreatic edema. Spleen: Similar appearance marked splenomegaly. Adrenals/Urinary Tract: No adrenal nodule or mass. Kidneys unremarkable. No evidence for hydroureter. The urinary bladder appears normal for the degree of distention. Stomach/Bowel: Stomach is unremarkable. No gastric wall thickening. No evidence of outlet obstruction. Duodenum is normally positioned as is the ligament of Treitz. No small bowel wall thickening. No small bowel dilatation. The terminal ileum is normal. The appendix is normal. No gross colonic mass. No colonic wall thickening. Vascular/Lymphatic: No abdominal aortic aneurysm. No abdominal aortic atherosclerotic calcification. Upper normal lymph nodes  are seen in the hepatoduodenal ligament. No retroperitoneal lymphadenopathy. No pelvic sidewall lymphadenopathy. Reproductive: The prostate gland and seminal vesicles are unremarkable. Other: No intraperitoneal free fluid. Musculoskeletal: No worrisome lytic or sclerotic osseous abnormality. IMPRESSION: 1. No acute findings in the abdomen or pelvis. 2. Similar appearance of marked splenomegaly. 3. 4 mm right lower lobe nodule seen previously not evident on today's study. Electronically Signed   By: Misty Stanley M.D.   On: 08/29/2021 11:59    Procedures Procedures    Medications Ordered in ED Medications  0.45 % sodium chloride infusion ( Intravenous New Bag/Given 08/29/21 1050)  ondansetron (ZOFRAN) injection 4 mg (4 mg Intravenous Given 08/29/21 1050)  HYDROmorphone (DILAUDID) injection 0.5 mg (0.5 mg Intravenous Given 08/29/21 1050)  HYDROmorphone (DILAUDID) injection 1 mg (1 mg Intravenous Given 08/29/21 1219)  HYDROmorphone (DILAUDID) injection 1 mg (1 mg Intravenous Given 08/29/21 1258)  iohexol (OMNIPAQUE) 300 MG/ML solution 100 mL (100 mLs Intravenous Contrast Given 08/29/21 1128)    ED Course/ Medical Decision Making/ A&P                           Medical Decision Making Amount  and/or Complexity of Data Reviewed Labs: ordered. Radiology: ordered.  Risk Prescription drug management.    Medical Screen Complete  This patient presented to the ED with complaint of sickle cell crisis.  This complaint involves an extensive number of treatment options. The initial differential diagnosis includes, but is not limited to, pain crisis, metabolic abnormality, acute infection, etc.  This presentation is: Acute, Chronic, Self-Limited, Previously Undiagnosed, Uncertain Prognosis, Complicated, Systemic Symptoms, and Threat to Life/Bodily Function  Patient is presenting with complaint of sickle cell painful crisis.  Patient is obtain screening labs are without significant abnormality  Patient feels significantly improved after treatment here in the ED.  Patient now desires DC home.  Importance of close follow-up distress.  Strict return precautions given understood.    Co morbidities that complicated the patient's evaluation  Sickle cell disease   Additional history obtained:  External records from outside sources obtained and reviewed including prior ED visits and prior Inpatient records.    Lab Tests:  I ordered and personally interpreted labs.  The pertinent results include: CBC, BMP, retic   Imaging Studies ordered:  I ordered imaging studies including CT AP  I independently visualized and interpreted obtained imaging which showed NAD I agree with the radiologist interpretation.   Cardiac Monitoring:  The patient was maintained on a cardiac monitor.  I personally viewed and interpreted the cardiac monitor which showed an underlying rhythm of: NSR   Medicines ordered:  I ordered medication including pain medication for pain Reevaluation of the patient after these medicines showed that the patient: improved     Problem List / ED Course:  Painful sickle cell crisis   Reevaluation:  After the interventions noted above, I reevaluated the  patient and found that they have: improved    Disposition:  After consideration of the diagnostic results and the patients response to treatment, I feel that the patent would benefit from close outpatient follow-up.          Final Clinical Impression(s) / ED Diagnoses Final diagnoses:  Sickle cell pain crisis [facility])    Rx / DC Orders ED Discharge Orders     None         Valarie Merino, MD 08/29/21 1408

## 2021-08-29 NOTE — ED Triage Notes (Signed)
Patient coming from home with c/o sickle cell crisis. Started about 2 hours ago. Patient been vomiting and took Zofran 2 hours ago at home.

## 2021-08-29 NOTE — Discharge Instructions (Signed)
Return for any problem.  ?

## 2021-09-02 ENCOUNTER — Other Ambulatory Visit: Payer: Self-pay | Admitting: Nurse Practitioner

## 2021-09-02 DIAGNOSIS — F1190 Opioid use, unspecified, uncomplicated: Secondary | ICD-10-CM

## 2021-09-02 DIAGNOSIS — G894 Chronic pain syndrome: Secondary | ICD-10-CM

## 2021-09-02 MED ORDER — OXYCODONE HCL 10 MG PO TABS: 10.0000 mg | ORAL_TABLET | Freq: Four times a day (QID) | ORAL | 0 refills | Status: DC | PRN

## 2021-09-23 ENCOUNTER — Other Ambulatory Visit: Payer: Self-pay | Admitting: Nurse Practitioner

## 2021-09-23 DIAGNOSIS — F1190 Opioid use, unspecified, uncomplicated: Secondary | ICD-10-CM

## 2021-09-23 DIAGNOSIS — G894 Chronic pain syndrome: Secondary | ICD-10-CM

## 2021-09-23 MED ORDER — OXYCODONE HCL 10 MG PO TABS: 10.0000 mg | ORAL_TABLET | Freq: Four times a day (QID) | ORAL | 0 refills | Status: DC | PRN

## 2021-10-08 ENCOUNTER — Other Ambulatory Visit: Payer: Self-pay | Admitting: Nurse Practitioner

## 2021-10-08 MED ORDER — OXYCODONE HCL 10 MG PO TABS: 10.0000 mg | ORAL_TABLET | Freq: Four times a day (QID) | ORAL | 0 refills | Status: DC | PRN

## 2021-10-14 ENCOUNTER — Emergency Department (HOSPITAL_COMMUNITY)
Admission: EM | Admit: 2021-10-14 | Discharge: 2021-10-14 | Disposition: A | Discharge status: Home / Self Care | Payer: 59 | Attending: Emergency Medicine | Admitting: Emergency Medicine

## 2021-10-14 ENCOUNTER — Emergency Department (HOSPITAL_COMMUNITY): Payer: 59

## 2021-10-14 ENCOUNTER — Encounter (HOSPITAL_COMMUNITY): Payer: Self-pay

## 2021-10-14 ENCOUNTER — Other Ambulatory Visit: Payer: Self-pay

## 2021-10-14 DIAGNOSIS — R61 Generalized hyperhidrosis: Secondary | ICD-10-CM | POA: Diagnosis not present

## 2021-10-14 DIAGNOSIS — D5700 Hb-SS disease with crisis, unspecified: Secondary | ICD-10-CM | POA: Diagnosis not present

## 2021-10-14 DIAGNOSIS — M549 Dorsalgia, unspecified: Secondary | ICD-10-CM | POA: Diagnosis not present

## 2021-10-14 DIAGNOSIS — R109 Unspecified abdominal pain: Secondary | ICD-10-CM | POA: Diagnosis not present

## 2021-10-14 DIAGNOSIS — D72829 Elevated white blood cell count, unspecified: Secondary | ICD-10-CM | POA: Insufficient documentation

## 2021-10-14 DIAGNOSIS — R1084 Generalized abdominal pain: Secondary | ICD-10-CM | POA: Diagnosis not present

## 2021-10-14 DIAGNOSIS — R1110 Vomiting, unspecified: Secondary | ICD-10-CM | POA: Diagnosis not present

## 2021-10-14 DIAGNOSIS — D57219 Sickle-cell/Hb-C disease with crisis, unspecified: Secondary | ICD-10-CM | POA: Insufficient documentation

## 2021-10-14 DIAGNOSIS — R110 Nausea: Secondary | ICD-10-CM | POA: Diagnosis not present

## 2021-10-14 LAB — URINALYSIS, ROUTINE W REFLEX MICROSCOPIC
Bilirubin Urine: NEGATIVE
Glucose, UA: NEGATIVE mg/dL
Hgb urine dipstick: NEGATIVE
Ketones, ur: NEGATIVE mg/dL
Leukocytes,Ua: NEGATIVE
Nitrite: NEGATIVE
Protein, ur: NEGATIVE mg/dL
Specific Gravity, Urine: 1.042 — ABNORMAL HIGH (ref 1.005–1.030)
pH: 7 (ref 5.0–8.0)

## 2021-10-14 LAB — COMPREHENSIVE METABOLIC PANEL
ALT: 20 U/L (ref 0–44)
AST: 11 U/L — ABNORMAL LOW (ref 15–41)
Albumin: 5.1 g/dL — ABNORMAL HIGH (ref 3.5–5.0)
Alkaline Phosphatase: 58 U/L (ref 38–126)
Anion gap: 6 (ref 5–15)
BUN: 9 mg/dL (ref 6–20)
CO2: 29 mmol/L (ref 22–32)
Calcium: 9.9 mg/dL (ref 8.9–10.3)
Chloride: 102 mmol/L (ref 98–111)
Creatinine, Ser: 0.82 mg/dL (ref 0.61–1.24)
GFR, Estimated: >60 mL/min (ref >60)
Glucose, Bld: 136 mg/dL — ABNORMAL HIGH (ref 70–99)
Potassium: 4.3 mmol/L (ref 3.5–5.1)
Sodium: 137 mmol/L (ref 135–145)
Total Bilirubin: 1.5 mg/dL — ABNORMAL HIGH (ref 0.3–1.2)
Total Protein: 8.9 g/dL — ABNORMAL HIGH (ref 6.5–8.1)

## 2021-10-14 LAB — CBC WITH DIFFERENTIAL/PLATELET
Abs Immature Granulocytes: 0.06 10*3/uL (ref 0.00–0.07)
Basophils Absolute: 0 10*3/uL (ref 0.0–0.1)
Basophils Relative: 0 %
Eosinophils Absolute: 0.1 10*3/uL (ref 0.0–0.5)
Eosinophils Relative: 1 %
HCT: 39.9 % (ref 39.0–52.0)
Hemoglobin: 14.5 g/dL (ref 13.0–17.0)
Immature Granulocytes: 0 %
Lymphocytes Relative: 16 %
Lymphs Abs: 2.4 10*3/uL (ref 0.7–4.0)
MCH: 28 pg (ref 26.0–34.0)
MCHC: 36.3 g/dL — ABNORMAL HIGH (ref 30.0–36.0)
MCV: 77.2 fL — ABNORMAL LOW (ref 80.0–100.0)
Monocytes Absolute: 1.1 10*3/uL — ABNORMAL HIGH (ref 0.1–1.0)
Monocytes Relative: 7 %
Neutro Abs: 11.5 10*3/uL — ABNORMAL HIGH (ref 1.7–7.7)
Neutrophils Relative %: 76 %
Platelets: 139 10*3/uL — ABNORMAL LOW (ref 150–400)
RBC: 5.17 MIL/uL (ref 4.22–5.81)
RDW: 14.6 % (ref 11.5–15.5)
WBC: 15.2 10*3/uL — ABNORMAL HIGH (ref 4.0–10.5)
nRBC: 0.4 % — ABNORMAL HIGH (ref 0.0–0.2)

## 2021-10-14 LAB — RETICULOCYTES
Immature Retic Fract: 34.9 % — ABNORMAL HIGH (ref 2.3–15.9)
RBC.: 5.09 MIL/uL (ref 4.22–5.81)
Retic Count, Absolute: 222.4 10*3/uL — ABNORMAL HIGH (ref 19.0–186.0)
Retic Ct Pct: 4.4 % — ABNORMAL HIGH (ref 0.4–3.1)

## 2021-10-14 LAB — LIPASE, BLOOD: Lipase: 29 U/L (ref 11–51)

## 2021-10-14 MED ORDER — ONDANSETRON HCL 4 MG/2ML IJ SOLN
4.0000 mg | INTRAMUSCULAR | Status: DC | PRN
  Administered 2021-10-14 (×2): 4 mg via INTRAVENOUS
  Filled 2021-10-14 (×2): qty 2

## 2021-10-14 MED ORDER — HYDROMORPHONE HCL 1 MG/ML IJ SOLN
1.0000 mg | Freq: Once | INTRAMUSCULAR | Status: AC
  Administered 2021-10-14: 1 mg via INTRAVENOUS
  Filled 2021-10-14: qty 1

## 2021-10-14 MED ORDER — SODIUM CHLORIDE 0.45 % IV SOLN: INTRAVENOUS | Status: DC

## 2021-10-14 MED ORDER — IOHEXOL 300 MG/ML  SOLN
100.0000 mL | Freq: Once | INTRAMUSCULAR | Status: AC | PRN
  Administered 2021-10-14: 100 mL via INTRAVENOUS

## 2021-10-14 MED ORDER — SODIUM CHLORIDE (PF) 0.9 % IJ SOLN
INTRAMUSCULAR | Status: AC
  Filled 2021-10-14: qty 50

## 2021-10-14 NOTE — ED Notes (Signed)
Pt c/o pain

## 2021-10-14 NOTE — ED Provider Notes (Signed)
?[facility]-EMERGENCY DEPT ?Provider Note ? ? ?CSN: [id number] ?Arrival date & time: 10/14/21  0807 ? ?  ? ?History ? ?Chief Complaint  ?Patient presents with  ? Sickle Cell Pain Crisis  ? ? ?Austin [name] is a 31 y.o. male.  Past medical history of sickle cell, splenomegaly who presents to the emergency department with sickle cell pain crisis. ? ?Patient states that pain began this morning.  He describes diffuse abdominal and back pain.  He has associated nausea and vomiting.  He denies fevers or diarrhea.  Denies urinary symptoms.  He states this is typical for his pain crises.  Denies chest pain or shortness of breath.  Denies extremity pain. ? ?The history is provided by the patient.  ?Sickle Cell Pain Crisis ?Location:  Abdomen and back ?Severity:  Severe ?Onset quality:  Gradual ?Duration:  4 hours ?Similar to previous crisis episodes: yes   ?Timing:  Constant ?Progression:  Unchanged ?Chronicity:  Recurrent ?Relieved by:  Nothing ?Worsened by:  Activity ?Ineffective treatments:  Prescription drugs and rest ?Associated symptoms: nausea and vomiting   ?Associated symptoms: no chest pain, no fever and no shortness of breath   ?Vomiting:  ?  Quality:  Bilious material ?Risk factors: no prior acute chest   ? ?  ? ?Home Medications ?Prior to Admission medications   ?Medication Sig Start Date End Date Taking? Authorizing Provider  ?[facility] NASAL ALLERGY SPRAY 55 MCG/ACT AERO nasal inhaler Place 1-2 sprays into the nose daily as needed (for allergies). ?Patient not taking: Reported on 08/25/2021 07/21/21   [provider]  ?dicyclomine (BENTYL) 20 MG tablet Take 1 tablet (20 mg total) by mouth 2 (two) times daily. 03/23/21 03/23/22  Barbette MerinoKing, Crystal M, NP  ?ergocalciferol (VITAMIN D2) 1.25 MG (50000 UT) capsule Take 1 capsule (50,000 Units total) by mouth once a week. X 12 weeks. ?Patient taking differently: Take 50,000 Units by mouth every Thursday. X 12 weeks. 05/27/21 05/27/22  Barbette MerinoKing, Crystal  M, NP  ?loratadine (CLARITIN) 10 MG tablet Take 1 tablet (10 mg total) by mouth daily. ?Patient taking differently: Take 10 mg by mouth daily as needed for allergies or rhinitis. 11/13/20   Barbette MerinoKing, Crystal M, NP  ?Olopatadine HCl 0.2 % SOLN Apply 1 drop to eye in the morning and at bedtime. 11/13/20 11/13/21  Barbette MerinoKing, Crystal M, NP  ?ondansetron (ZOFRAN-ODT) 4 MG disintegrating tablet Take 1 tablet (4 mg total) by mouth every 8 (eight) hours as needed for nausea or vomiting. TAKE 1 TABLET BY MOUTH EVERY 8 HOURS AS NEEDED 08/21/21   Barbette MerinoKing, Crystal M, NP  ?Oxycodone HCl 10 MG TABS Take 1 tablet (10 mg total) by mouth 4 (four) times daily as needed for up to 15 days. 10/08/21 10/23/21  Barbette MerinoKing, Crystal M, NP  ?triamcinolone (NASACORT) 55 MCG/ACT AERO nasal inhaler Place 2 sprays into the nose daily. 2 sprays each nostril at night 04/27/21   Drema HalonNewman, Christopher E, MD  ?   ? ?Allergies    ?Patient has no known allergies.   ? ?Review of Systems   ?Review of Systems  ?Constitutional:  Negative for fever.  ?Respiratory:  Negative for shortness of breath.   ?Cardiovascular:  Negative for chest pain.  ?Gastrointestinal:  Positive for abdominal pain, nausea and vomiting. Negative for diarrhea.  ?Genitourinary:  Negative for dysuria.  ?Musculoskeletal:  Positive for back pain.  ?All other systems reviewed and are negative. ? ?Physical Exam ?Updated Vital Signs ?BP (!) 172/117   Pulse 90  Temp 97.9 ?F (36.6 ?C) (Oral)   Resp 20   SpO2 99%  ?Physical Exam ?Vitals and nursing note reviewed.  ?Constitutional:   ?   General: He is in acute distress.  ?   Appearance: Normal appearance. He is ill-appearing and diaphoretic. He is not toxic-appearing.  ?HENT:  ?   Head: Normocephalic and atraumatic.  ?   Nose: Nose normal.  ?   Mouth/Throat:  ?   Mouth: Mucous membranes are moist.  ?   Pharynx: Oropharynx is clear.  ?Eyes:  ?   Extraocular Movements: Extraocular movements intact.  ?Cardiovascular:  ?   Rate and Rhythm: Normal rate and regular  rhythm.  ?   Pulses: Normal pulses.  ?   Heart sounds: Normal heart sounds. No murmur heard. ?Pulmonary:  ?   Effort: Pulmonary effort is normal. No respiratory distress.  ?   Breath sounds: Normal breath sounds.  ?Abdominal:  ?   General: Abdomen is flat. Bowel sounds are decreased. There is no distension.  ?   Palpations: Abdomen is soft.  ?   Tenderness: There is generalized abdominal tenderness. There is no right CVA tenderness, left CVA tenderness or guarding.  ?Musculoskeletal:     ?   General: Normal range of motion.  ?   Cervical back: Neck supple.  ?Skin: ?   General: Skin is warm.  ?   Capillary Refill: Capillary refill takes less than 2 seconds.  ?Neurological:  ?   General: No focal deficit present.  ?   Mental Status: He is alert and oriented to person, place, and time. Mental status is at baseline.  ?Psychiatric:     ?   Mood and Affect: Mood normal.     ?   Behavior: Behavior normal.     ?   Thought Content: Thought content normal.     ?   Judgment: Judgment normal.  ? ? ?ED Results / Procedures / Treatments   ?Labs ?(all labs ordered are listed, but only abnormal results are displayed) ?Labs Reviewed  ?CBC WITH DIFFERENTIAL/PLATELET - Abnormal; Notable for the following components:  ?    Result Value  ? WBC 15.2 (*)   ? MCV 77.2 (*)   ? MCHC 36.3 (*)   ? Platelets 139 (*)   ? nRBC 0.4 (*)   ? Neutro Abs 11.5 (*)   ? Monocytes Absolute 1.1 (*)   ? All other components within normal limits  ?RETICULOCYTES - Abnormal; Notable for the following components:  ? Retic Ct Pct 4.4 (*)   ? Retic Count, Absolute 222.4 (*)   ? Immature Retic Fract 34.9 (*)   ? All other components within normal limits  ?COMPREHENSIVE METABOLIC PANEL - Abnormal; Notable for the following components:  ? Glucose, Bld 136 (*)   ? Total Protein 8.9 (*)   ? Albumin 5.1 (*)   ? AST 11 (*)   ? Total Bilirubin 1.5 (*)   ? All other components within normal limits  ?URINALYSIS, ROUTINE W REFLEX MICROSCOPIC - Abnormal; Notable for the  following components:  ? Specific Gravity, Urine 1.042 (*)   ? All other components within normal limits  ?LIPASE, BLOOD  ? ?EKG ?None ? ?Radiology ?CT ABDOMEN PELVIS W CONTRAST ? ?Result Date: 10/14/2021 ?CLINICAL DATA:  Nausea, vomiting and abdominal pain EXAM: CT ABDOMEN AND PELVIS WITH CONTRAST TECHNIQUE: Multidetector CT imaging of the abdomen and pelvis was performed using the standard protocol following bolus administration of intravenous contrast. RADIATION DOSE  REDUCTION: This exam was performed according to the departmental dose-optimization program which includes automated exposure control, adjustment of the mA and/or kV according to patient size and/or use of iterative reconstruction technique. CONTRAST:  OMNIPAQUE IOHEXOL 300 MG/ML  SOLN COMPARISON:  CT abdomen and pelvis dated August 29, 2021 FINDINGS: Lower chest: No acute abnormality. Hepatobiliary: No focal liver abnormality is seen. No gallstones, gallbladder wall thickening, or biliary dilatation. Pancreas: Unremarkable. No pancreatic ductal dilatation or surrounding inflammatory changes. Spleen: Unchanged splenomegaly. Adrenals/Urinary Tract: Adrenal glands are unremarkable. Kidneys are normal, without renal calculi, focal lesion, or hydronephrosis. Bladder is unremarkable. Stomach/Bowel: Stomach is within normal limits. Appendix appears normal. No evidence of bowel wall thickening, distention, or inflammatory changes. Vascular/Lymphatic: No significant vascular findings are present. No enlarged abdominal or pelvic lymph nodes. Reproductive: Prostate is unremarkable. Other: No abdominal wall hernia or abnormality. No abdominopelvic ascites. Musculoskeletal: No acute or significant osseous findings. IMPRESSION: 1. No acute findings in the abdomen or pelvis. 2. Unchanged splenomegaly. Electronically Signed   By: Allegra Lai M.D.   On: 10/14/2021 11:54   ? ?Procedures ?Procedures  ? ?Medications Ordered in ED ?Medications  ?0.45 % sodium  chloride infusion ( Intravenous New Bag/Given 10/14/21 0851)  ?ondansetron (ZOFRAN) injection 4 mg (4 mg Intravenous Given 10/14/21 1148)  ?sodium chloride (PF) 0.9 % injection (has no administration in time range)

## 2021-10-14 NOTE — ED Triage Notes (Signed)
EMS reports from home, c/o sickle cell crisis, back and abdominal pain and nausea. ? ?BP 132/76 ?HR 70 ?RR 16 ?Sp02 98 RA ? ?20ga LAC ?4 Zofran enroute ?

## 2021-10-14 NOTE — Discharge Instructions (Signed)
You were seen in the emergency department for sickle cell pain crisis.  You mentioned that your sickle cell doctor is moving so I provided you with Dr. Louis Meckel information and a referral to his office.  If they do not call you in the next 48 to 72 hours please give them a call to schedule an appointment.  Please return to the emergency department for any worsening symptoms. ?

## 2021-10-21 ENCOUNTER — Telehealth: Payer: Self-pay

## 2021-10-21 NOTE — Telephone Encounter (Signed)
Oxycodone  °

## 2021-10-23 ENCOUNTER — Other Ambulatory Visit: Payer: Self-pay | Admitting: Internal Medicine

## 2021-10-23 DIAGNOSIS — F1190 Opioid use, unspecified, uncomplicated: Secondary | ICD-10-CM

## 2021-10-23 DIAGNOSIS — G894 Chronic pain syndrome: Secondary | ICD-10-CM

## 2021-10-23 MED ORDER — OXYCODONE HCL 10 MG PO TABS: 10.0000 mg | ORAL_TABLET | Freq: Four times a day (QID) | ORAL | 0 refills | Status: DC | PRN

## 2021-11-03 ENCOUNTER — Other Ambulatory Visit: Payer: Self-pay | Admitting: Internal Medicine

## 2021-11-03 ENCOUNTER — Telehealth: Payer: Self-pay

## 2021-11-03 DIAGNOSIS — G894 Chronic pain syndrome: Secondary | ICD-10-CM

## 2021-11-03 DIAGNOSIS — F1190 Opioid use, unspecified, uncomplicated: Secondary | ICD-10-CM

## 2021-11-03 MED ORDER — OXYCODONE HCL 10 MG PO TABS: 10.0000 mg | ORAL_TABLET | Freq: Four times a day (QID) | ORAL | 0 refills | Status: DC | PRN

## 2021-11-03 NOTE — Telephone Encounter (Signed)
Oxycodone  °

## 2021-11-05 ENCOUNTER — Ambulatory Visit: Payer: 59 | Admitting: Internal Medicine

## 2021-11-10 ENCOUNTER — Telehealth: Payer: Self-pay

## 2021-11-10 NOTE — Telephone Encounter (Signed)
Oxycodone  °

## 2021-11-16 ENCOUNTER — Other Ambulatory Visit: Payer: Self-pay | Admitting: Internal Medicine

## 2021-11-19 ENCOUNTER — Ambulatory Visit: Payer: 59 | Admitting: Nurse Practitioner

## 2021-11-30 ENCOUNTER — Telehealth: Payer: Self-pay

## 2021-11-30 NOTE — Telephone Encounter (Signed)
Oxycodone  ?Zolfran ?

## 2021-12-02 ENCOUNTER — Telehealth: Payer: Self-pay

## 2021-12-02 ENCOUNTER — Other Ambulatory Visit: Payer: Self-pay | Admitting: Internal Medicine

## 2021-12-02 DIAGNOSIS — G894 Chronic pain syndrome: Secondary | ICD-10-CM

## 2021-12-02 DIAGNOSIS — F1190 Opioid use, unspecified, uncomplicated: Secondary | ICD-10-CM

## 2021-12-02 MED ORDER — ONDANSETRON 4 MG PO TBDP: 4.0000 mg | ORAL_TABLET | Freq: Three times a day (TID) | ORAL | 5 refills | Status: DC | PRN

## 2021-12-02 MED ORDER — OXYCODONE HCL 10 MG PO TABS: 10.0000 mg | ORAL_TABLET | Freq: Four times a day (QID) | ORAL | 0 refills | Status: AC | PRN

## 2021-12-02 NOTE — Telephone Encounter (Signed)
Oxycodone  °

## 2022-02-03 ENCOUNTER — Other Ambulatory Visit (HOSPITAL_COMMUNITY): Payer: Self-pay

## 2022-02-03 ENCOUNTER — Other Ambulatory Visit: Payer: Self-pay | Admitting: Family Medicine

## 2022-02-03 ENCOUNTER — Encounter (HOSPITAL_COMMUNITY): Payer: Self-pay

## 2022-02-03 ENCOUNTER — Emergency Department (HOSPITAL_COMMUNITY): Payer: Commercial Managed Care - PPO

## 2022-02-03 ENCOUNTER — Emergency Department (HOSPITAL_COMMUNITY)
Admission: EM | Admit: 2022-02-03 | Discharge: 2022-02-03 | Disposition: A | Discharge status: Home / Self Care | Payer: Commercial Managed Care - PPO | Source: Home / Self Care | Attending: Emergency Medicine | Admitting: Emergency Medicine

## 2022-02-03 DIAGNOSIS — G894 Chronic pain syndrome: Secondary | ICD-10-CM

## 2022-02-03 DIAGNOSIS — R109 Unspecified abdominal pain: Secondary | ICD-10-CM | POA: Insufficient documentation

## 2022-02-03 DIAGNOSIS — D571 Sickle-cell disease without crisis: Secondary | ICD-10-CM

## 2022-02-03 DIAGNOSIS — D5700 Hb-SS disease with crisis, unspecified: Secondary | ICD-10-CM | POA: Insufficient documentation

## 2022-02-03 DIAGNOSIS — R112 Nausea with vomiting, unspecified: Secondary | ICD-10-CM | POA: Insufficient documentation

## 2022-02-03 LAB — CBC WITH DIFFERENTIAL/PLATELET
Abs Immature Granulocytes: 0.05 10*3/uL (ref 0.00–0.07)
Basophils Absolute: 0 10*3/uL (ref 0.0–0.1)
Basophils Relative: 0 %
Eosinophils Absolute: 0.1 10*3/uL (ref 0.0–0.5)
Eosinophils Relative: 1 %
HCT: 42.8 % (ref 39.0–52.0)
Hemoglobin: 15.5 g/dL (ref 13.0–17.0)
Immature Granulocytes: 0 %
Lymphocytes Relative: 15 %
Lymphs Abs: 2 10*3/uL (ref 0.7–4.0)
MCH: 28.7 pg (ref 26.0–34.0)
MCHC: 36.2 g/dL — ABNORMAL HIGH (ref 30.0–36.0)
MCV: 79.3 fL — ABNORMAL LOW (ref 80.0–100.0)
Monocytes Absolute: 0.9 10*3/uL (ref 0.1–1.0)
Monocytes Relative: 7 %
Neutro Abs: 10.7 10*3/uL — ABNORMAL HIGH (ref 1.7–7.7)
Neutrophils Relative %: 77 %
Platelets: 174 10*3/uL (ref 150–400)
RBC: 5.4 MIL/uL (ref 4.22–5.81)
RDW: 14.5 % (ref 11.5–15.5)
WBC: 13.8 10*3/uL — ABNORMAL HIGH (ref 4.0–10.5)
nRBC: 0.4 % — ABNORMAL HIGH (ref 0.0–0.2)

## 2022-02-03 LAB — HEPATIC FUNCTION PANEL
ALT: 24 U/L (ref 0–44)
AST: 13 U/L — ABNORMAL LOW (ref 15–41)
Albumin: 5.3 g/dL — ABNORMAL HIGH (ref 3.5–5.0)
Alkaline Phosphatase: 57 U/L (ref 38–126)
Bilirubin, Direct: 0.3 mg/dL — ABNORMAL HIGH (ref 0.0–0.2)
Indirect Bilirubin: 1.9 mg/dL — ABNORMAL HIGH (ref 0.3–0.9)
Total Bilirubin: 2.2 mg/dL — ABNORMAL HIGH (ref 0.3–1.2)
Total Protein: 9.2 g/dL — ABNORMAL HIGH (ref 6.5–8.1)

## 2022-02-03 LAB — APTT: aPTT: 30 seconds (ref 24–36)

## 2022-02-03 LAB — RETICULOCYTES
Immature Retic Fract: 34.5 % — ABNORMAL HIGH (ref 2.3–15.9)
RBC.: 5.32 MIL/uL (ref 4.22–5.81)
Retic Count, Absolute: 229.3 10*3/uL — ABNORMAL HIGH (ref 19.0–186.0)
Retic Ct Pct: 4.3 % — ABNORMAL HIGH (ref 0.4–3.1)

## 2022-02-03 LAB — BASIC METABOLIC PANEL
Anion gap: 8 (ref 5–15)
BUN: 9 mg/dL (ref 6–20)
CO2: 27 mmol/L (ref 22–32)
Calcium: 10.1 mg/dL (ref 8.9–10.3)
Chloride: 105 mmol/L (ref 98–111)
Creatinine, Ser: 1.03 mg/dL (ref 0.61–1.24)
GFR, Estimated: >60 mL/min (ref >60)
Glucose, Bld: 129 mg/dL — ABNORMAL HIGH (ref 70–99)
Potassium: 3.7 mmol/L (ref 3.5–5.1)
Sodium: 140 mmol/L (ref 135–145)

## 2022-02-03 LAB — CBG MONITORING, ED: Glucose-Capillary: 134 mg/dL — ABNORMAL HIGH (ref 70–99)

## 2022-02-03 LAB — LIPASE, BLOOD: Lipase: 28 U/L (ref 11–51)

## 2022-02-03 MED ORDER — HYDROMORPHONE HCL 1 MG/ML IJ SOLN
1.0000 mg | INTRAMUSCULAR | Status: AC
  Administered 2022-02-03: 1 mg via INTRAVENOUS
  Filled 2022-02-03: qty 1

## 2022-02-03 MED ORDER — OXYCODONE HCL 10 MG PO TABS
10.0000 mg | ORAL_TABLET | ORAL | 0 refills | Status: DC | PRN
Start: 2022-02-03 — End: 2022-03-18
  Filled 2022-02-03: qty 10, 2d supply, fill #0

## 2022-02-03 MED ORDER — OXYCODONE HCL 10 MG PO TABS: 10.0000 mg | ORAL_TABLET | Freq: Four times a day (QID) | ORAL | 0 refills | Status: DC | PRN

## 2022-02-03 MED ORDER — ONDANSETRON HCL 4 MG/2ML IJ SOLN
4.0000 mg | INTRAMUSCULAR | Status: DC | PRN
  Administered 2022-02-03: 4 mg via INTRAVENOUS
  Filled 2022-02-03: qty 2

## 2022-02-03 MED ORDER — ONDANSETRON 8 MG PO TBDP
8.0000 mg | ORAL_TABLET | Freq: Three times a day (TID) | ORAL | Status: DC | PRN
  Administered 2022-02-03: 8 mg via ORAL
  Filled 2022-02-03: qty 1

## 2022-02-03 MED ORDER — IOHEXOL 300 MG/ML  SOLN
100.0000 mL | Freq: Once | INTRAMUSCULAR | Status: AC | PRN
  Administered 2022-02-03: 100 mL via INTRAVENOUS

## 2022-02-03 MED ORDER — DIPHENHYDRAMINE HCL 25 MG PO CAPS
25.0000 mg | ORAL_CAPSULE | ORAL | Status: DC | PRN
  Administered 2022-02-03: 50 mg via ORAL
  Filled 2022-02-03: qty 2

## 2022-02-03 MED ORDER — SODIUM CHLORIDE (PF) 0.9 % IJ SOLN
INTRAMUSCULAR | Status: AC
  Filled 2022-02-03: qty 50

## 2022-02-03 MED ORDER — HYDROMORPHONE HCL 1 MG/ML IJ SOLN
0.5000 mg | INTRAMUSCULAR | Status: AC
  Administered 2022-02-03: 0.5 mg via INTRAVENOUS
  Filled 2022-02-03: qty 1

## 2022-02-03 MED ORDER — DEXTROSE-NACL 5-0.45 % IV SOLN: INTRAVENOUS | Status: DC

## 2022-02-03 NOTE — ED Notes (Signed)
Pt requesting to get blood work when he gets an IV in a room.

## 2022-02-03 NOTE — Discharge Instructions (Signed)
A short course of pain medication was sent to the [facility] outpatient pharmacy.  It appears that the sickle cell doctor [facility] [address]  Take your medications as prescribed. Return to the ED with new or worsening symptoms including difficulty breathing, fever, chest pain, abdominal pain, not able to eat or drink or other concerns

## 2022-02-03 NOTE — ED Provider Triage Note (Signed)
Emergency Medicine Provider Triage Evaluation Note  Austin [name] , a 31 y.o. male  was evaluated in triage.  Pt complains of sickle cell pain felt mostly in the abdomen, lower back, and bilateral knees.  Patient states that his pain began last night and is becoming more severe.  He states that he has been out of his home regimen for the past 2 months.  Normally at home he takes Roxicodone.  He states that when coming to the emergency department he normally has to take Dilaudid.  Review of Systems  Positive: Abdominal pain, back pain, bilateral knee pain, nausea Negative: Chest pain, shortness of breath  Physical Exam  BP (!) 146/97 (BP Location: Left Arm)   Pulse 83   Temp 98.1 F (36.7 C) (Oral)   Resp 18   SpO2 100%  Gen:   Awake, no distress   Resp:  Normal effort  MSK:   Moves extremities without difficulty  Other:    Medical Decision Making  Medically screening exam initiated at 8:58 AM.  Appropriate orders placed.  Austin [name] was informed that the remainder of the evaluation will be completed by another provider, this initial triage assessment does not replace that evaluation, and the importance of remaining in the ED until their evaluation is complete.     Darrick Grinder, [place] 02/03/22 [phone]

## 2022-02-03 NOTE — ED Provider Notes (Signed)
[facility]-EMERGENCY DEPT Provider Note   CSN: [id number] Arrival date & time: 02/03/22  9476     History  Chief Complaint  Patient presents with   Sickle Cell Pain Crisis    Austin [name] is a 31 y.o. male.  Patient with sickle cell presenting with typical pain involving his abdomen, low back and bilateral knees.  States pain is typical of his sickle cell crises admit out of his home pain medication for more than 2 months which includes Roxicodone.  He has had nausea and vomiting at home several episodes this morning.  Denies fever.  Denies chest pain or shortness of breath.  Denies pain with urination or blood in the urine.  Denies any change in his chronic sickle cell pain pattern. States he has not had any opiates for about 2 months after they were not filled by his sickle cell doctor. Still has appendix and gallbladder  The history is provided by the patient.  Sickle Cell Pain Crisis Associated symptoms: nausea and vomiting   Associated symptoms: no chest pain, no congestion, no cough, no fever, no headaches and no shortness of breath        Home Medications Prior to Admission medications   Medication Sig Start Date End Date Taking? Authorizing Provider  [facility] NASAL ALLERGY SPRAY 55 MCG/ACT AERO nasal inhaler Place 1-2 sprays into the nose daily as needed (for allergies). Patient not taking: Reported on 08/25/2021 07/21/21   [provider]  dicyclomine (BENTYL) 20 MG tablet Take 1 tablet (20 mg total) by mouth 2 (two) times daily. 03/23/21 03/23/22  Barbette Merino, NP  ergocalciferol (VITAMIN D2) 1.25 MG (50000 UT) capsule Take 1 capsule (50,000 Units total) by mouth once a week. X 12 weeks. Patient taking differently: Take 50,000 Units by mouth every Thursday. X 12 weeks. 05/27/21 05/27/22  Barbette Merino, NP  loratadine (CLARITIN) 10 MG tablet Take 1 tablet (10 mg total) by mouth daily. Patient taking differently: Take 10 mg by mouth daily  as needed for allergies or rhinitis. 11/13/20   Barbette Merino, NP  ondansetron (ZOFRAN-ODT) 4 MG disintegrating tablet Take 1 tablet (4 mg total) by mouth every 8 (eight) hours as needed for nausea or vomiting. TAKE 1 TABLET BY MOUTH EVERY 8 HOURS AS NEEDED 12/02/21   Quentin Angst, MD  triamcinolone (NASACORT) 55 MCG/ACT AERO nasal inhaler Place 2 sprays into the nose daily. 2 sprays each nostril at night 04/27/21   Drema Halon, MD      Allergies    Patient has no known allergies.    Review of Systems   Review of Systems  Constitutional:  Positive for activity change and appetite change. Negative for fever.  HENT:  Negative for congestion and postnasal drip.   Respiratory:  Negative for cough, chest tightness and shortness of breath.   Cardiovascular:  Negative for chest pain.  Gastrointestinal:  Positive for abdominal pain, nausea and vomiting.  Genitourinary:  Negative for dysuria.  Musculoskeletal:  Positive for arthralgias, back pain and myalgias.  Skin:  Negative for rash.  Neurological:  Negative for dizziness, weakness and headaches.   all other systems are negative except as noted in the HPI and PMH.    Physical Exam Updated Vital Signs BP 139/85   Pulse (!) 58   Temp 98.1 F (36.7 C) (Oral)   Resp 17   SpO2 100%  Physical Exam Vitals and nursing note reviewed.  Constitutional:  General: He is not in acute distress.    Appearance: He is well-developed. He is ill-appearing and diaphoretic.  HENT:     Head: Normocephalic and atraumatic.     Mouth/Throat:     Pharynx: No oropharyngeal exudate.  Eyes:     Conjunctiva/sclera: Conjunctivae normal.     Pupils: Pupils are equal, round, and reactive to light.  Neck:     Comments: No meningismus. Cardiovascular:     Rate and Rhythm: Normal rate and regular rhythm.     Heart sounds: Normal heart sounds. No murmur heard. Pulmonary:     Effort: Pulmonary effort is normal. No respiratory distress.      Breath sounds: Normal breath sounds.  Abdominal:     Palpations: Abdomen is soft.     Tenderness: There is abdominal tenderness. There is no guarding or rebound.     Comments: Diffuse tenderness, voluntary guarding throughout  Musculoskeletal:        General: No tenderness. Normal range of motion.     Cervical back: Normal range of motion and neck supple.     Comments: Full range of motion of major joints without significant effusion or erythema  Skin:    General: Skin is warm.  Neurological:     Mental Status: He is alert and oriented to person, place, and time.     Cranial Nerves: No cranial nerve deficit.     Motor: No abnormal muscle tone.     Coordination: Coordination normal.     Comments:  5/5 strength throughout. CN 2-12 intact.Equal grip strength.   Psychiatric:        Behavior: Behavior normal.     ED Results / Procedures / Treatments   Labs (all labs ordered are listed, but only abnormal results are displayed) Labs Reviewed  CBC WITH DIFFERENTIAL/PLATELET - Abnormal; Notable for the following components:      Result Value   WBC 13.8 (*)    MCV 79.3 (*)    MCHC 36.2 (*)    nRBC 0.4 (*)    Neutro Abs 10.7 (*)    All other components within normal limits  RETICULOCYTES - Abnormal; Notable for the following components:   Retic Ct Pct 4.3 (*)    Retic Count, Absolute 229.3 (*)    Immature Retic Fract 34.5 (*)    All other components within normal limits  BASIC METABOLIC PANEL - Abnormal; Notable for the following components:   Glucose, Bld 129 (*)    All other components within normal limits  HEPATIC FUNCTION PANEL - Abnormal; Notable for the following components:   Total Protein 9.2 (*)    Albumin 5.3 (*)    AST 13 (*)    Total Bilirubin 2.2 (*)    Bilirubin, Direct 0.3 (*)    Indirect Bilirubin 1.9 (*)    All other components within normal limits  CBG MONITORING, ED - Abnormal; Notable for the following components:   Glucose-Capillary 134 (*)    All other  components within normal limits  APTT  LIPASE, BLOOD  URINALYSIS, ROUTINE W REFLEX MICROSCOPIC    EKG EKG Interpretation  Date/Time:  Wednesday February 03 2022 10:33:06 EDT Ventricular Rate:  59 PR Interval:  132 QRS Duration: 95 QT Interval:  403 QTC Calculation: 400 R Axis:   86 Text Interpretation: Sinus rhythm Probable left ventricular hypertrophy Probable inferior infarct, old No significant change was found Confirmed by Glynn Octave [phone]) on 02/03/2022 12:20:24 PM  Radiology DG Chest 2 View  Result  Date: 02/03/2022 CLINICAL DATA:  31 year old male with history of sickle cell disease presenting with chest pain and shortness of breath. EXAM: CHEST - 2 VIEW COMPARISON:  Chest x-ray 08/04/2021. FINDINGS: Lung volumes are normal. No consolidative airspace disease. No pleural effusions. No pneumothorax. No pulmonary nodule or mass noted. Pulmonary vasculature and the cardiomediastinal silhouette are within normal limits. IMPRESSION: No radiographic evidence of acute cardiopulmonary disease. Electronically Signed   By: Trudie Reed M.D.   On: 02/03/2022 09:19    Procedures Procedures    Medications Ordered in ED Medications  ondansetron (ZOFRAN-ODT) disintegrating tablet 8 mg (8 mg Oral Given 02/03/22 0902)  HYDROmorphone (DILAUDID) injection 0.5 mg (has no administration in time range)  HYDROmorphone (DILAUDID) injection 1 mg (has no administration in time range)  HYDROmorphone (DILAUDID) injection 1 mg (has no administration in time range)  ondansetron (ZOFRAN) injection 4 mg (has no administration in time range)  diphenhydrAMINE (BENADRYL) capsule 25-50 mg (has no administration in time range)  dextrose 5 %-0.45 % sodium chloride infusion (has no administration in time range)    ED Course/ Medical Decision Making/ A&P                           Medical Decision Making Amount and/or Complexity of Data Reviewed Labs: ordered. Decision-making details documented in ED  Course. Radiology: ordered and independent interpretation performed. Decision-making details documented in ED Course. ECG/medicine tests: ordered and independent interpretation performed. Decision-making details documented in ED Course.  Risk Prescription drug management.   Typical sickle cell pain involving abdomen, back, knees and legs.  He is diaphoretic but not febrile.  Question opiate withdrawal.  Has not had any pain medication he states since March.  We will initiate labs, pain and nausea control, treatment per sickle cell crisis protocol  Patient's lab studies are stable.  Hemoglobin is at baseline.  Reticulocyte count is adequate.  Patient diaphoresis likely secondary to opiate withdrawal and pain.  He feels improved after multiple doses of pain medications.  He is requesting to go home does not want to be admitted.  Discussed with [place] Hollis NP of sickle cell service who is familiar with patient.  She will call in oxycodone to patient's pharmacy.  [facility] pharmacy.  He is tolerating PO and requesting to go home. Narcotic database reviewed with no recent prescriptions.  Followup with sickle cell clinic. Return precautions discussed.        Final Clinical Impression(s) / ED Diagnoses Final diagnoses:  Sickle cell pain crisis [facility])    Rx / DC Orders ED Discharge Orders     None         Caymen Dubray, Jeannett Senior, MD 02/03/22 1732

## 2022-02-03 NOTE — Progress Notes (Signed)
Reviewed PDMP substance reporting system prior to prescribing opiate medications. No inconsistencies noted.  Meds ordered this encounter  Medications   Oxycodone HCl 10 MG TABS    Sig: Take 1 tablet (10 mg total) by mouth every 6 (six) hours as needed.    Dispense:  60 tablet    Refill:  0    Order Specific Question:   Supervising Provider    Answer:   JEGEDE, OLUGBEMIGA E [[id number]]   Lakrisha Iseman Moore Karris Deangelo  APRN, MSN, FNP-C [facility] [address]  [place], [place] [address] [phone]  

## 2022-02-03 NOTE — ED Triage Notes (Signed)
Pt arrived via POV, c/o SCC. Abd pain, vomiting since yesterday.

## 2022-02-04 ENCOUNTER — Other Ambulatory Visit: Payer: Self-pay

## 2022-02-04 ENCOUNTER — Inpatient Hospital Stay (HOSPITAL_COMMUNITY)
Admission: EM | Admit: 2022-02-04 | Discharge: 2022-02-05 | DRG: 812 | Disposition: A | Discharge status: Home / Self Care | Payer: Commercial Managed Care - PPO | Attending: Internal Medicine | Admitting: Internal Medicine

## 2022-02-04 ENCOUNTER — Encounter (HOSPITAL_COMMUNITY): Payer: Self-pay | Admitting: Emergency Medicine

## 2022-02-04 DIAGNOSIS — G894 Chronic pain syndrome: Secondary | ICD-10-CM | POA: Diagnosis present

## 2022-02-04 DIAGNOSIS — Z79899 Other long term (current) drug therapy: Secondary | ICD-10-CM | POA: Diagnosis not present

## 2022-02-04 DIAGNOSIS — E876 Hypokalemia: Secondary | ICD-10-CM | POA: Diagnosis present

## 2022-02-04 DIAGNOSIS — Z832 Family history of diseases of the blood and blood-forming organs and certain disorders involving the immune mechanism: Secondary | ICD-10-CM | POA: Diagnosis not present

## 2022-02-04 DIAGNOSIS — Z87891 Personal history of nicotine dependence: Secondary | ICD-10-CM

## 2022-02-04 DIAGNOSIS — D72829 Elevated white blood cell count, unspecified: Secondary | ICD-10-CM | POA: Diagnosis present

## 2022-02-04 DIAGNOSIS — Z7951 Long term (current) use of inhaled steroids: Secondary | ICD-10-CM | POA: Diagnosis not present

## 2022-02-04 DIAGNOSIS — D5700 Hb-SS disease with crisis, unspecified: Secondary | ICD-10-CM | POA: Diagnosis present

## 2022-02-04 DIAGNOSIS — R1084 Generalized abdominal pain: Secondary | ICD-10-CM | POA: Diagnosis present

## 2022-02-04 DIAGNOSIS — E860 Dehydration: Secondary | ICD-10-CM | POA: Diagnosis present

## 2022-02-04 DIAGNOSIS — D638 Anemia in other chronic diseases classified elsewhere: Secondary | ICD-10-CM | POA: Diagnosis present

## 2022-02-04 DIAGNOSIS — Z91141 Patient's other noncompliance with medication regimen due to financial hardship: Secondary | ICD-10-CM | POA: Diagnosis not present

## 2022-02-04 LAB — LIPASE, BLOOD: Lipase: 26 U/L (ref 11–51)

## 2022-02-04 LAB — CBC WITH DIFFERENTIAL/PLATELET
Abs Immature Granulocytes: 0.06 10*3/uL (ref 0.00–0.07)
Basophils Absolute: 0 10*3/uL (ref 0.0–0.1)
Basophils Relative: 0 %
Eosinophils Absolute: 0 10*3/uL (ref 0.0–0.5)
Eosinophils Relative: 0 %
HCT: 39.9 % (ref 39.0–52.0)
Hemoglobin: 14.7 g/dL (ref 13.0–17.0)
Immature Granulocytes: 1 %
Lymphocytes Relative: 12 %
Lymphs Abs: 1.5 10*3/uL (ref 0.7–4.0)
MCH: 28.8 pg (ref 26.0–34.0)
MCHC: 36.8 g/dL — ABNORMAL HIGH (ref 30.0–36.0)
MCV: 78.2 fL — ABNORMAL LOW (ref 80.0–100.0)
Monocytes Absolute: 0.6 10*3/uL (ref 0.1–1.0)
Monocytes Relative: 5 %
Neutro Abs: 10.1 10*3/uL — ABNORMAL HIGH (ref 1.7–7.7)
Neutrophils Relative %: 82 %
Platelets: 168 10*3/uL (ref 150–400)
RBC: 5.1 MIL/uL (ref 4.22–5.81)
RDW: 14.3 % (ref 11.5–15.5)
WBC: 12.3 10*3/uL — ABNORMAL HIGH (ref 4.0–10.5)
nRBC: 0.2 % (ref 0.0–0.2)

## 2022-02-04 LAB — COMPREHENSIVE METABOLIC PANEL
ALT: 24 U/L (ref 0–44)
AST: 10 U/L — ABNORMAL LOW (ref 15–41)
Albumin: 5.1 g/dL — ABNORMAL HIGH (ref 3.5–5.0)
Alkaline Phosphatase: 56 U/L (ref 38–126)
Anion gap: 9 (ref 5–15)
BUN: 7 mg/dL (ref 6–20)
CO2: 28 mmol/L (ref 22–32)
Calcium: 9.9 mg/dL (ref 8.9–10.3)
Chloride: 99 mmol/L (ref 98–111)
Creatinine, Ser: 0.93 mg/dL (ref 0.61–1.24)
GFR, Estimated: >60 mL/min (ref >60)
Glucose, Bld: 133 mg/dL — ABNORMAL HIGH (ref 70–99)
Potassium: 3.4 mmol/L — ABNORMAL LOW (ref 3.5–5.1)
Sodium: 136 mmol/L (ref 135–145)
Total Bilirubin: 2.1 mg/dL — ABNORMAL HIGH (ref 0.3–1.2)
Total Protein: 9.3 g/dL — ABNORMAL HIGH (ref 6.5–8.1)

## 2022-02-04 LAB — PROTIME-INR
INR: 1.1 (ref 0.8–1.2)
Prothrombin Time: 13.7 seconds (ref 11.4–15.2)

## 2022-02-04 LAB — RETICULOCYTES
Immature Retic Fract: 31 % — ABNORMAL HIGH (ref 2.3–15.9)
RBC.: 5.12 MIL/uL (ref 4.22–5.81)
Retic Count, Absolute: 211.5 10*3/uL — ABNORMAL HIGH (ref 19.0–186.0)
Retic Ct Pct: 4.1 % — ABNORMAL HIGH (ref 0.4–3.1)

## 2022-02-04 MED ORDER — KETOROLAC TROMETHAMINE 15 MG/ML IJ SOLN
15.0000 mg | INTRAMUSCULAR | Status: AC
  Administered 2022-02-04: 15 mg via INTRAVENOUS
  Filled 2022-02-04: qty 1

## 2022-02-04 MED ORDER — DICYCLOMINE HCL 20 MG PO TABS
20.0000 mg | ORAL_TABLET | Freq: Two times a day (BID) | ORAL | Status: DC
Start: 2022-02-04 — End: 2022-02-05
  Administered 2022-02-04 – 2022-02-05 (×2): 20 mg via ORAL
  Filled 2022-02-04 (×2): qty 1

## 2022-02-04 MED ORDER — HYDROMORPHONE HCL 1 MG/ML IJ SOLN
1.0000 mg | Freq: Once | INTRAMUSCULAR | Status: AC
  Administered 2022-02-04: 1 mg via INTRAVENOUS
  Filled 2022-02-04: qty 1

## 2022-02-04 MED ORDER — HYDROMORPHONE HCL 2 MG/ML IJ SOLN
2.0000 mg | INTRAMUSCULAR | Status: AC
  Administered 2022-02-04: 2 mg via INTRAVENOUS
  Filled 2022-02-04: qty 1

## 2022-02-04 MED ORDER — SODIUM CHLORIDE 0.9% FLUSH: 9.0000 mL | INTRAVENOUS | Status: DC | PRN

## 2022-02-04 MED ORDER — ENOXAPARIN SODIUM 40 MG/0.4ML IJ SOSY
40.0000 mg | PREFILLED_SYRINGE | INTRAMUSCULAR | Status: DC
  Filled 2022-02-04: qty 0.4

## 2022-02-04 MED ORDER — POLYETHYLENE GLYCOL 3350 17 G PO PACK: 17.0000 g | PACK | Freq: Every day | ORAL | Status: DC | PRN

## 2022-02-04 MED ORDER — SENNOSIDES-DOCUSATE SODIUM 8.6-50 MG PO TABS
1.0000 | ORAL_TABLET | Freq: Two times a day (BID) | ORAL | Status: DC
  Administered 2022-02-05: 1 via ORAL
  Filled 2022-02-04: qty 1

## 2022-02-04 MED ORDER — KETOROLAC TROMETHAMINE 15 MG/ML IJ SOLN
15.0000 mg | Freq: Four times a day (QID) | INTRAMUSCULAR | Status: DC
  Administered 2022-02-04 – 2022-02-05 (×4): 15 mg via INTRAVENOUS
  Filled 2022-02-04 (×4): qty 1

## 2022-02-04 MED ORDER — ONDANSETRON HCL 4 MG/2ML IJ SOLN
4.0000 mg | Freq: Once | INTRAMUSCULAR | Status: AC
  Administered 2022-02-04: 4 mg via INTRAVENOUS
  Filled 2022-02-04: qty 2

## 2022-02-04 MED ORDER — HYDROMORPHONE 1 MG/ML IV SOLN
INTRAVENOUS | Status: DC
  Administered 2022-02-04: 30 mg via INTRAVENOUS
  Administered 2022-02-04 – 2022-02-05 (×2): 2.5 mg via INTRAVENOUS
  Administered 2022-02-05: 0.5 mg via INTRAVENOUS
  Administered 2022-02-05: 1 mg via INTRAVENOUS
  Filled 2022-02-04: qty 30

## 2022-02-04 MED ORDER — SODIUM CHLORIDE 0.9 % IV SOLN
12.5000 mg | Freq: Once | INTRAVENOUS | Status: AC
  Administered 2022-02-04: 12.5 mg via INTRAVENOUS
  Filled 2022-02-04: qty 12.5

## 2022-02-04 MED ORDER — HYDROMORPHONE HCL 1 MG/ML IJ SOLN
1.0000 mg | INTRAMUSCULAR | Status: AC | PRN
  Administered 2022-02-04 (×2): 1 mg via INTRAVENOUS
  Filled 2022-02-04 (×2): qty 1

## 2022-02-04 MED ORDER — DIPHENHYDRAMINE HCL 25 MG PO CAPS: 25.0000 mg | ORAL_CAPSULE | ORAL | Status: DC | PRN

## 2022-02-04 MED ORDER — NALOXONE HCL 0.4 MG/ML IJ SOLN: 0.4000 mg | INTRAMUSCULAR | Status: DC | PRN

## 2022-02-04 MED ORDER — ONDANSETRON HCL 4 MG/2ML IJ SOLN
4.0000 mg | INTRAMUSCULAR | Status: DC | PRN
  Administered 2022-02-05: 4 mg via INTRAVENOUS
  Filled 2022-02-04: qty 2

## 2022-02-04 NOTE — ED Provider Triage Note (Signed)
Emergency Medicine Provider Triage Evaluation Note  Austin [name] , a 31 y.o. male  was evaluated in triage.  Pt complains of sickle cell pain crisis.  Patient was seen in this ED yesterday, had IV pain medication administered to relieve his sickle cell pain.  Patient did not request to be admitted at this time.  The patient states that he was able to pick up home prescription medication which has not relieved his pain.  Patient states pain is located in bilateral knees, back, abdomen.  Patient actively vomiting in triage.  Sickle cell coordinator Austin [name] has been consulted and has agreed admit the patient.  Lab orders have been placed.  Review of Systems  Positive:  Negative:   Physical Exam  BP (!) 145/90   Pulse 84   Temp 98.3 F (36.8 C) (Oral)   Resp 18   Ht 5\' 7"  (1.702 m)   Wt 83.9 kg   SpO2 100%   BMI 28.98 kg/m  Gen:   Awake, no distress   Resp:  Normal effort  MSK:   Moves extremities without difficulty  Other:    Medical Decision Making  Medically screening exam initiated at 1:48 PM.  Appropriate orders placed.  Austin [name] was informed that the remainder of the evaluation will be completed by another provider, this initial triage assessment does not replace that evaluation, and the importance of remaining in the ED until their evaluation is complete.     Austin Edward, PA-C 02/04/22 1349

## 2022-02-04 NOTE — ED Provider Notes (Signed)
[facility]-EMERGENCY DEPT Provider Note   CSN: [id number] Arrival date & time: 02/04/22  1306     History  Chief Complaint  Patient presents with   Sickle Cell Pain Crisis    Austin [name] is a 31 y.o. male.  Patient returns with sickle cell pain involving his back, abdomen and knees.  Seen by myself for the same yesterday.  States he filled his oxycodone but was only able to take 1 pill and is having severe pain.  He is also having nausea and vomiting unable to keep anything down.  Diaphoresis but no fever.  No chest pain or shortness of breath.  States the pain is similar to his previous sickle cell crises.  Denies any change in his chronic pain pattern.  The pain is to his abdomen, low back, bilateral legs and knees.  No pain with urination or blood in the urine.  No chest pain or shortness of breath.  Work-up was reassuring yesterday including CT abdomen pelvis.  The history is provided by the patient. The history is limited by the condition of the patient.  Sickle Cell Pain Crisis Associated symptoms: nausea and vomiting   Associated symptoms: no chest pain, no congestion, no cough, no fever, no headaches and no shortness of breath        Home Medications Prior to Admission medications   Medication Sig Start Date End Date Taking? Authorizing Provider  [employer] NASAL ALLERGY SPRAY 55 MCG/ACT AERO nasal inhaler Place 1-2 sprays into the nose daily as needed (for allergies). Patient not taking: Reported on 08/25/2021 07/21/21   [provider]  dicyclomine (BENTYL) 20 MG tablet Take 1 tablet (20 mg total) by mouth 2 (two) times daily. 03/23/21 03/23/22  Barbette Merino, NP  ergocalciferol (VITAMIN D2) 1.25 MG (50000 UT) capsule Take 1 capsule (50,000 Units total) by mouth once a week. X 12 weeks. Patient taking differently: Take 50,000 Units by mouth every Thursday. X 12 weeks. 05/27/21 05/27/22  Barbette Merino, NP  loratadine (CLARITIN) 10 MG tablet  Take 1 tablet (10 mg total) by mouth daily. Patient taking differently: Take 10 mg by mouth daily as needed for allergies or rhinitis. 11/13/20   Barbette Merino, NP  ondansetron (ZOFRAN-ODT) 4 MG disintegrating tablet Take 1 tablet (4 mg total) by mouth every 8 (eight) hours as needed for nausea or vomiting. TAKE 1 TABLET BY MOUTH EVERY 8 HOURS AS NEEDED 12/02/21   Quentin Angst, MD  Oxycodone HCl 10 MG TABS Take 1 tablet (10 mg total) by mouth every 6 (six) hours as needed. 02/03/22   Massie Maroon, FNP  Oxycodone HCl 10 MG TABS Take 1 tablet (10 mg total) by mouth every 4 (four) hours as needed for severe pain. 02/03/22   Synthia Fairbank, Jeannett Senior, MD  triamcinolone (NASACORT) 55 MCG/ACT AERO nasal inhaler Place 2 sprays into the nose daily. 2 sprays each nostril at night 04/27/21   Drema Halon, MD      Allergies    Patient has no known allergies.    Review of Systems   Review of Systems  Constitutional:  Positive for activity change and appetite change. Negative for fever.  HENT:  Negative for congestion.   Respiratory:  Negative for cough, chest tightness and shortness of breath.   Cardiovascular:  Negative for chest pain.  Gastrointestinal:  Positive for abdominal pain, nausea and vomiting.  Genitourinary:  Negative for dysuria and hematuria.  Skin:  Negative for wound.  Neurological:  Negative for dizziness, weakness and headaches.   all other systems are negative except as noted in the HPI and PMH.     Physical Exam Updated Vital Signs BP (!) 145/90   Pulse 84   Temp 98.3 F (36.8 C) (Oral)   Resp 18   Ht 5\' 7"  (1.702 m)   Wt 83.9 kg   SpO2 100%   BMI 28.98 kg/m  Physical Exam Vitals and nursing note reviewed.  Constitutional:      General: He is in acute distress.     Appearance: He is well-developed. He is ill-appearing and diaphoretic.     Comments: Ill-appearing, diaphoretic  HENT:     Head: Normocephalic and atraumatic.     Mouth/Throat:     Pharynx:  No oropharyngeal exudate.  Eyes:     Conjunctiva/sclera: Conjunctivae normal.     Pupils: Pupils are equal, round, and reactive to light.  Neck:     Comments: No meningismus. Cardiovascular:     Rate and Rhythm: Normal rate and regular rhythm.     Heart sounds: Normal heart sounds. No murmur heard. Pulmonary:     Effort: Pulmonary effort is normal. No respiratory distress.     Breath sounds: Normal breath sounds.  Chest:     Chest wall: No tenderness.  Abdominal:     Palpations: Abdomen is soft.     Tenderness: There is no abdominal tenderness. There is no guarding or rebound.     Comments: Diffuse tenderness. No guarding or rebound  Musculoskeletal:        General: Tenderness present. Normal range of motion.     Cervical back: Normal range of motion and neck supple.     Comments: Paraspinal lumbar tenderness, full range of motion of bilateral hips ankles and knees without pain  Skin:    General: Skin is warm.  Neurological:     Mental Status: He is alert and oriented to person, place, and time.     Cranial Nerves: No cranial nerve deficit.     Motor: No abnormal muscle tone.     Coordination: Coordination normal.     Comments:  5/5 strength throughout. CN 2-12 intact.Equal grip strength.   Psychiatric:        Behavior: Behavior normal.     ED Results / Procedures / Treatments   Labs (all labs ordered are listed, but only abnormal results are displayed) Labs Reviewed  CBC WITH DIFFERENTIAL/PLATELET - Abnormal; Notable for the following components:      Result Value   WBC 12.3 (*)    MCV 78.2 (*)    MCHC 36.8 (*)    Neutro Abs 10.1 (*)    All other components within normal limits  RETICULOCYTES - Abnormal; Notable for the following components:   Retic Ct Pct 4.1 (*)    Retic Count, Absolute 211.5 (*)    Immature Retic Fract 31.0 (*)    All other components within normal limits  COMPREHENSIVE METABOLIC PANEL - Abnormal; Notable for the following components:    Potassium 3.4 (*)    Glucose, Bld 133 (*)    Total Protein 9.3 (*)    Albumin 5.1 (*)    AST 10 (*)    Total Bilirubin 2.1 (*)    All other components within normal limits  LIPASE, BLOOD  PROTIME-INR  URINALYSIS, ROUTINE W REFLEX MICROSCOPIC  BASIC METABOLIC PANEL  CBC    EKG None  Radiology CT ABDOMEN PELVIS W CONTRAST  Result Date:  02/03/2022 CLINICAL DATA:  Abdominal pain. EXAM: CT ABDOMEN AND PELVIS WITH CONTRAST TECHNIQUE: Multidetector CT imaging of the abdomen and pelvis was performed using the standard protocol following bolus administration of intravenous contrast. RADIATION DOSE REDUCTION: This exam was performed according to the departmental dose-optimization program which includes automated exposure control, adjustment of the mA and/or kV according to patient size and/or use of iterative reconstruction technique. CONTRAST:  OMNIPAQUE IOHEXOL 300 MG/ML  SOLN COMPARISON:  CT abdomen and pelvis dated October 14, 2021 FINDINGS: Lower chest: No acute abnormality. Hepatobiliary: No focal liver abnormality is seen. No gallstones, gallbladder wall thickening, or biliary dilatation. Pancreas: Unremarkable. No pancreatic ductal dilatation or surrounding inflammatory changes. Spleen: Unchanged splenomegaly. Adrenals/Urinary Tract: Adrenal glands are unremarkable. Kidneys are normal, without renal calculi, focal lesion, or hydronephrosis. Bladder is unremarkable. Stomach/Bowel: Stomach is within normal limits. Appendix appears normal. No evidence of bowel wall thickening, distention, or inflammatory changes. Vascular/Lymphatic: No significant vascular findings are present. No enlarged abdominal or pelvic lymph nodes. Reproductive: Prostate is unremarkable. Other: No abdominal wall hernia or abnormality. No abdominopelvic ascites. Musculoskeletal: No acute or significant osseous findings. IMPRESSION: 1. No acute findings in the abdomen or pelvis. 2. Stable splenomegaly. Electronically Signed    By: Allegra Lai M.D.   On: 02/03/2022 12:28   DG Chest 2 View  Result Date: 02/03/2022 CLINICAL DATA:  31 year old male with history of sickle cell disease presenting with chest pain and shortness of breath. EXAM: CHEST - 2 VIEW COMPARISON:  Chest x-ray 08/04/2021. FINDINGS: Lung volumes are normal. No consolidative airspace disease. No pleural effusions. No pneumothorax. No pulmonary nodule or mass noted. Pulmonary vasculature and the cardiomediastinal silhouette are within normal limits. IMPRESSION: No radiographic evidence of acute cardiopulmonary disease. Electronically Signed   By: Trudie Reed M.D.   On: 02/03/2022 09:19    Procedures Procedures    Medications Ordered in ED Medications  ketorolac (TORADOL) 15 MG/ML injection 15 mg (has no administration in time range)  HYDROmorphone (DILAUDID) injection 2 mg (has no administration in time range)  diphenhydrAMINE (BENADRYL) 12.5 mg in sodium chloride 0.9 % 50 mL IVPB (has no administration in time range)  ondansetron (ZOFRAN) injection 4 mg (has no administration in time range)  ondansetron (ZOFRAN) injection 4 mg (has no administration in time range)  HYDROmorphone (DILAUDID) injection 1 mg (has no administration in time range)    ED Course/ Medical Decision Making/ A&P                           Medical Decision Making Amount and/or Complexity of Data Reviewed Radiology: ordered and independent interpretation performed. Decision-making details documented in ED Course. ECG/medicine tests: ordered and independent interpretation performed. Decision-making details documented in ED Course.  Risk Prescription drug management. Decision regarding hospitalization.   Patient returns with typical sickle cell pain involving his abdomen, back and legs.  Seen for same by myself yesterday.  Work-up was reassuring including CT abdomen pelvis.  He did pick up his pain medication but was only able to take 1 dose and is having severe  vomiting, diaphoresis and pain.  He is requesting admission.  Patient given IV fluids, pain and nausea medications per sickle cell protocol.  Labs are stable compared to yesterday.  He reticulocyte count is adequate and hemoglobin is stable.  Patient remains quite uncomfortable with persistent vomiting and diaphoresis.  He is requesting admission.  Discussed with NP Hart Rochester who will admit patient. Labs not  significantly changed from yesterday.   With recurrent pain and vomiting, will plan admission.        Final Clinical Impression(s) / ED Diagnoses Final diagnoses:  Sickle cell pain crisis [facility])    Rx / DC Orders ED Discharge Orders     None         Aubri Gathright, Jeannett Senior, MD 02/04/22 1824

## 2022-02-04 NOTE — H&P (Signed)
H&P  Patient Demographics:  Austin [name], is a 31 y.o. male  MRN: [id number]   DOB - 12/31/1990  Admit Date - 02/04/2022  Outpatient Primary MD for the patient is Quentin Angst, MD  Chief Complaint  Patient presents with   Sickle Cell Pain Crisis      HPI:   Austin [name]  is a 31 y.o. male with a medical history significant for sickle cell disease, chronic pain syndrome, opiate dependence and tolerance, and history of anemia of chronic disease presents with complaints of allover body pain that has been present over the past 2 days.  Patient says that pain is consistent with his previous sickle cell crisis.  Patient currently endorses nausea and vomiting over the past several hours.  He has been unable to keep anything down, including his pain medications.  Pain is primarily to his abdomen, and lower extremities.  He rates his pain at 9/10.  Patient was treated and evaluated in the emergency department on yesterday and was discharged home in stable condition.  He states that pain returned throughout the night.  He currently characterizes it as sharp and constant.  Patient denies any dizziness, chest pain, headache, or blurred vision.  No diarrhea or urinary symptoms.  Patient denies any sick contacts or recent travel.  While in the emergency department vital signs have remained stable.BP 134/85 (BP Location: Left Arm)   Pulse 64   Temp 98.3 F (36.8 C) (Oral)   Resp 12   Ht 5\' 7"  (1.702 m)   Wt 83.9 kg   SpO2 95%   BMI 28.98 kg/m  Complete blood count shows WBCs el reticulocytes 4.1%.  Evated at 12.3, platelets 168,000, and hemoglobin 14.7.  Complete metabolic panel shows potassium 3.4 and total bilirubin 2.1.  CT of abdomen and pelvis shows no acute findings and stable splenomegaly.  Chest x-ray done yesterday showed no acute cardiopulmonary findings.  Patient's pain persists despite IV fluids, IV Dilaudid, and IV Toradol.  Patient will be admitted for further management of  his sickle cell pain crisis.   Review of systems:  Review of Systems  Constitutional:  Negative for chills and fever.  HENT: Negative.    Eyes: Negative.   Respiratory: Negative.    Cardiovascular: Negative.   Genitourinary: Negative.   Musculoskeletal:  Positive for back pain and joint pain.  Skin: Negative.   Neurological: Negative.   Psychiatric/Behavioral: Negative.      With Past History of the following :   Past Medical History:  Diagnosis Date   Acute maxillary sinusitis    Eczema    Sickle cell anemia (HCC) .      Past Surgical History:  Procedure Laterality Date   FOOT SURGERY Left 12/25/2020   WISDOM TOOTH EXTRACTION       Social History:   Social History   Tobacco Use   Smoking status: Former    Packs/day: 0.10    Types: Cigarettes   Smokeless tobacco: Never  Substance Use Topics   Alcohol use: Not Currently     Lives - At home   Family History :   Family History  Problem Relation Age of Onset   Sickle cell trait Mother    Diabetes Father    Hypertension Father      Home Medications:   Prior to Admission medications   Medication Sig Start Date End Date Taking? Authorizing Provider  dicyclomine (BENTYL) 20 MG tablet Take 1 tablet (20 mg total) by mouth 2 (two)  times daily. 03/23/21 03/23/22 Yes Barbette Merino, NP  ergocalciferol (VITAMIN D2) 1.25 MG (50000 UT) capsule Take 1 capsule (50,000 Units total) by mouth once a week. X 12 weeks. Patient taking differently: Take 50,000 Units by mouth once a week. 05/27/21 05/27/22 Yes King, Shana Chute, NP  loratadine (CLARITIN) 10 MG tablet Take 1 tablet (10 mg total) by mouth daily. Patient taking differently: Take 10 mg by mouth daily as needed for allergies or rhinitis. 11/13/20  Yes King, Shana Chute, NP  ondansetron (ZOFRAN-ODT) 4 MG disintegrating tablet Take 1 tablet (4 mg total) by mouth every 8 (eight) hours as needed for nausea or vomiting. TAKE 1 TABLET BY MOUTH EVERY 8 HOURS AS NEEDED Patient  taking differently: Take 4 mg by mouth every 8 (eight) hours as needed for nausea or vomiting. 12/02/21  Yes Jegede, Olugbemiga E, MD  Oxycodone HCl 10 MG TABS Take 1 tablet (10 mg total) by mouth every 4 (four) hours as needed for severe pain. 02/03/22  Yes Rancour, Jeannett Senior, MD  Oxycodone HCl 10 MG TABS Take 1 tablet (10 mg total) by mouth every 6 (six) hours as needed. Patient not taking: Reported on 02/04/2022 02/03/22   Massie Maroon, FNP  triamcinolone (NASACORT) 55 MCG/ACT AERO nasal inhaler Place 2 sprays into the nose daily. 2 sprays each nostril at night Patient not taking: Reported on 02/04/2022 04/27/21   Drema Halon, MD     Allergies:   No Known Allergies   Physical Exam:   Vitals:   Vitals:   02/04/22 1312 02/04/22 1447  BP: (!) 145/90 134/85  Pulse: 84 64  Resp: 18 12  Temp: 98.3 F (36.8 C)   SpO2: 100% 95%    Physical Exam: Constitutional: Patient appears well-developed and well-nourished.  Acute distress, writhing in pain. HENT: Normocephalic, atraumatic, External right and left ear normal. Oropharynx is clear and moist.  Eyes: Conjunctivae and EOM are normal. PERRLA, no scleral icterus. Neck: Normal ROM. Neck supple. No JVD. No tracheal deviation. No thyromegaly. CVS: RRR, S1/S2 +, no murmurs, no gallops, no carotid bruit.  Pulmonary: Effort and breath sounds normal, no stridor, rhonchi, wheezes, rales.  Abdominal: Guarded.  Soft. Musculoskeletal: Normal range of motion. No edema and no tenderness.  Lymphadenopathy: No lymphadenopathy noted, cervical, inguinal or axillary Neuro: Alert. Normal reflexes, muscle tone coordination. No cranial nerve deficit. Skin: Skin is warm and dry. No rash noted. Not diaphoretic. No erythema. No pallor. Psychiatric: Normal mood and affect. Behavior, judgment, thought content normal.   Data Review:   CBC Recent Labs  Lab 02/03/22 0901 02/04/22 1348  WBC 13.8* 12.3*  HGB 15.5 14.7  HCT 42.8 39.9  PLT 174 168   MCV 79.3* 78.2*  MCH 28.7 28.8  MCHC 36.2* 36.8*  RDW 14.5 14.3  LYMPHSABS 2.0 1.5  MONOABS 0.9 0.6  EOSABS 0.1 0.0  BASOSABS 0.0 0.0   ------------------------------------------------------------------------------------------------------------------  Chemistries  Recent Labs  Lab 02/03/22 0901 02/04/22 1348  NA 140 136  K 3.7 3.4*  CL 105 99  CO2 27 28  GLUCOSE 129* 133*  BUN 9 7  CREATININE 1.03 0.93  CALCIUM 10.1 9.9  AST 13* 10*  ALT 24 24  ALKPHOS 57 56  BILITOT 2.2* 2.1*   ------------------------------------------------------------------------------------------------------------------ estimated creatinine clearance is 118.1 mL/min (by C-G formula based on SCr of 0.93 mg/dL). ------------------------------------------------------------------------------------------------------------------ No results for input(s): "TSH", "T4TOTAL", "T3FREE", "THYROIDAB" in the last 72 hours.  Invalid input(s): "FREET3"  Coagulation profile Recent Labs  Lab  02/04/22 1437  INR 1.1   ------------------------------------------------------------------------------------------------------------------- No results for input(s): "DDIMER" in the last 72 hours. -------------------------------------------------------------------------------------------------------------------  Cardiac Enzymes No results for input(s): "CKMB", "TROPONINI", "MYOGLOBIN" in the last 168 hours.  Invalid input(s): "CK" ------------------------------------------------------------------------------------------------------------------ No results found for: "BNP"  ---------------------------------------------------------------------------------------------------------------  Urinalysis    Component Value Date/Time   COLORURINE YELLOW 10/14/2021 1232   APPEARANCEUR CLEAR 10/14/2021 1232   LABSPEC 1.042 (H) 10/14/2021 1232   PHURINE 7.0 10/14/2021 1232   GLUCOSEU NEGATIVE 10/14/2021 1232   HGBUR NEGATIVE  10/14/2021 1232   BILIRUBINUR NEGATIVE 10/14/2021 1232   BILIRUBINUR negative 08/21/2021 1527   BILIRUBINUR negative 02/05/2020 0749   KETONESUR NEGATIVE 10/14/2021 1232   PROTEINUR NEGATIVE 10/14/2021 1232   UROBILINOGEN 2.0 (A) 08/21/2021 1527   UROBILINOGEN 1.0 10/18/2017 0841   NITRITE NEGATIVE 10/14/2021 1232   LEUKOCYTESUR NEGATIVE 10/14/2021 1232    ----------------------------------------------------------------------------------------------------------------   Imaging Results:    CT ABDOMEN PELVIS W CONTRAST  Result Date: 02/03/2022 CLINICAL DATA:  Abdominal pain. EXAM: CT ABDOMEN AND PELVIS WITH CONTRAST TECHNIQUE: Multidetector CT imaging of the abdomen and pelvis was performed using the standard protocol following bolus administration of intravenous contrast. RADIATION DOSE REDUCTION: This exam was performed according to the departmental dose-optimization program which includes automated exposure control, adjustment of the mA and/or kV according to patient size and/or use of iterative reconstruction technique. CONTRAST:  OMNIPAQUE IOHEXOL 300 MG/ML  SOLN COMPARISON:  CT abdomen and pelvis dated October 14, 2021 FINDINGS: Lower chest: No acute abnormality. Hepatobiliary: No focal liver abnormality is seen. No gallstones, gallbladder wall thickening, or biliary dilatation. Pancreas: Unremarkable. No pancreatic ductal dilatation or surrounding inflammatory changes. Spleen: Unchanged splenomegaly. Adrenals/Urinary Tract: Adrenal glands are unremarkable. Kidneys are normal, without renal calculi, focal lesion, or hydronephrosis. Bladder is unremarkable. Stomach/Bowel: Stomach is within normal limits. Appendix appears normal. No evidence of bowel wall thickening, distention, or inflammatory changes. Vascular/Lymphatic: No significant vascular findings are present. No enlarged abdominal or pelvic lymph nodes. Reproductive: Prostate is unremarkable. Other: No abdominal wall hernia or  abnormality. No abdominopelvic ascites. Musculoskeletal: No acute or significant osseous findings. IMPRESSION: 1. No acute findings in the abdomen or pelvis. 2. Stable splenomegaly. Electronically Signed   By: Allegra Lai M.D.   On: 02/03/2022 12:28   DG Chest 2 View  Result Date: 02/03/2022 CLINICAL DATA:  32 year old male with history of sickle cell disease presenting with chest pain and shortness of breath. EXAM: CHEST - 2 VIEW COMPARISON:  Chest x-ray 08/04/2021. FINDINGS: Lung volumes are normal. No consolidative airspace disease. No pleural effusions. No pneumothorax. No pulmonary nodule or mass noted. Pulmonary vasculature and the cardiomediastinal silhouette are within normal limits. IMPRESSION: No radiographic evidence of acute cardiopulmonary disease. Electronically Signed   By: Trudie Reed M.D.   On: 02/03/2022 09:19     Assessment & Plan:  Principal Problem:   Sickle cell pain crisis (HCC) Active Problems:   Generalized abdominal pain   Dehydration   Chronic pain syndrome   Sickle cell disease with pain crisis: Admit.  Initiate IV Dilaudid PCA with settings of 0.5 mg, 10-minute lockout, and 2 mg/h. IV fluids, 0.45% saline at 100 mL/h Toradol 15 mg IV every 6 hours Hold oxycodone, will restart as pain intensity improves Monitor vital signs very closely, reevaluate pain scale regularly, and supplemental oxygen as needed Patient will be reevaluated for pain in the context of function and relationship to baseline as his care progresses.  Nausea/vomiting: CT of abdomen and pelvis shows no acute  abdominal findings.  Review GI panel as results become available.  Continue antiemetics.  Advance diet as tolerated.    Chronic pain syndrome: We will restart home medications as pain intensity improves.  Anemia of chronic disease: Today, patient's hemoglobin is within normal range.  No blood transfusion warranted at this time.  We will continue to monitor  closely.  Hypokalemia: Potassium slightly decreased.  No repletion at this time.  Repeat labs in AM.  Leukocytosis: WBCs mildly elevated.  GI panel and urinalysis pending.  We will repeat labs in AM. DVT Prophylaxis: Subcut Lovenox   AM Labs Ordered, also please review Full Orders  Family Communication: Admission, patient's condition and plan of care including tests being ordered have been discussed with the patient who indicate understanding and agree with the plan and Code Status.  Code Status: Full Code  Consults called: None    Admission status: Inpatient    Time spent in minutes : 50 minutes Nolon Nations  APRN, MSN, FNP-C Patient Care [facility] [address] [place], [place] [address] [phone]  02/04/2022 at 3:55 PM

## 2022-02-04 NOTE — ED Triage Notes (Signed)
Per [facility] pt c/o abdominal pain, nausea and vomiting and back pain r/t sickle cell. States he took zofran and oxycodone this morning w/o relief. Patient actively vomiting in triage.

## 2022-02-05 DIAGNOSIS — D5700 Hb-SS disease with crisis, unspecified: Secondary | ICD-10-CM | POA: Diagnosis not present

## 2022-02-05 LAB — CBC
HCT: 36.9 % — ABNORMAL LOW (ref 39.0–52.0)
Hemoglobin: 13.2 g/dL (ref 13.0–17.0)
MCH: 28.4 pg (ref 26.0–34.0)
MCHC: 35.8 g/dL (ref 30.0–36.0)
MCV: 79.4 fL — ABNORMAL LOW (ref 80.0–100.0)
Platelets: 113 10*3/uL — ABNORMAL LOW (ref 150–400)
RBC: 4.65 MIL/uL (ref 4.22–5.81)
RDW: 14.3 % (ref 11.5–15.5)
WBC: 9.4 10*3/uL (ref 4.0–10.5)
nRBC: 0.3 % — ABNORMAL HIGH (ref 0.0–0.2)

## 2022-02-05 LAB — BASIC METABOLIC PANEL
Anion gap: 6 (ref 5–15)
BUN: 7 mg/dL (ref 6–20)
CO2: 31 mmol/L (ref 22–32)
Calcium: 9.5 mg/dL (ref 8.9–10.3)
Chloride: 100 mmol/L (ref 98–111)
Creatinine, Ser: 0.99 mg/dL (ref 0.61–1.24)
GFR, Estimated: >60 mL/min (ref >60)
Glucose, Bld: 104 mg/dL — ABNORMAL HIGH (ref 70–99)
Potassium: 3.5 mmol/L (ref 3.5–5.1)
Sodium: 137 mmol/L (ref 135–145)

## 2022-02-05 NOTE — Discharge Summary (Signed)
Physician Discharge Summary  Austin [name] ZDG:[id number] DOB: 1990-12-20 DOA: 02/04/2022  PCP: Quentin Angst, MD  Admit date: 02/04/2022  Discharge date: 02/05/2022  Discharge Diagnoses:  Principal Problem:   Sickle cell pain crisis (HCC) Active Problems:   Generalized abdominal pain   Dehydration   Chronic pain syndrome   Discharge Condition: Stable  Disposition:  Pt is discharged home in good condition and is to follow up with Quentin Angst, MD this week to have labs evaluated. Austin [name] is instructed to increase activity slowly and balance with rest for the next few days, and use prescribed medication to complete treatment of pain  Diet: Regular Wt Readings from Last 3 Encounters:  02/04/22 83.9 kg  08/29/21 83.9 kg  08/25/21 85.7 kg    History of present illness:  Austin [name]  is a 31 y.o. male with a medical history significant for sickle cell disease, chronic pain syndrome, opiate dependence and tolerance, and history of anemia of chronic disease presents with complaints of allover body pain that has been present over the past 2 days.  Patient says that pain is consistent with his previous sickle cell crisis.  Patient currently endorses nausea and vomiting over the past several hours.  He has been unable to keep anything down, including his pain medications.  Pain is primarily to his abdomen, and lower extremities.  He rates his pain at 9/10.  Patient was treated and evaluated in the emergency department on yesterday and was discharged home in stable condition.  He states that pain returned throughout the night.  He currently characterizes it as sharp and constant.  Patient denies any dizziness, chest pain, headache, or blurred vision.  No diarrhea or urinary symptoms.  Patient denies any sick contacts or recent travel.  While in the emergency department vital signs have remained stable.BP 134/85 (BP Location: Left Arm)   Pulse 64   Temp 98.3 F  (36.8 C) (Oral)   Resp 12   Ht 5\' 7"  (1.702 m)   Wt 83.9 kg   SpO2 95%   BMI 28.98 kg/m  Complete blood count shows WBCs el reticulocytes 4.1%.  Evated at 12.3, platelets 168,000, and hemoglobin 14.7.  Complete metabolic panel shows potassium 3.4 and total bilirubin 2.1.  CT of abdomen and pelvis shows no acute findings and stable splenomegaly.  Chest x-ray done yesterday showed no acute cardiopulmonary findings.  Patient's pain persists despite IV fluids, IV Dilaudid, and IV Toradol.  Patient will be admitted for further management of his sickle cell pain crisis.  Hospital Course:  Sickle cell disease with pain crisis:  Patient was admitted for sickle cell pain crisis and managed appropriately with IVF, IV Dilaudid via PCA and IV Toradol, as well as other adjunct therapies per sickle cell pain management protocols. Pain intensity decreased throughout admission and patient is requesting discharge. Patient advised to resume all home medications.   Patient was therefore discharged home today in a hemodynamically stable condition.   Austin [name] will follow-up with PCP within 1 week of this discharge. Austin [name] was counseled extensively about nonpharmacologic means of pain management, patient verbalized understanding and was appreciative of  the care received during this admission.   We discussed the need for good hydration, monitoring of hydration status, avoidance of heat, cold, stress, and infection triggers. We discussed the need to be adherent with taking home medications. Patient was reminded of the need to seek medical attention immediately if any symptom of bleeding, anemia, or infection occurs.  Discharge Exam: Vitals:   02/05/22 1032 02/05/22 1227  BP: 133/90   Pulse: 85   Resp:  16  Temp: (!) 97.5 F (36.4 C)   SpO2: 100% 100%   Vitals:   02/05/22 0613 02/05/22 0734 02/05/22 1032 02/05/22 1227  BP: 127/71  133/90   Pulse: 77  85   Resp: 16 16  16   Temp: 97.9 F (36.6 C)  (!) 97.5 F  (36.4 C)   TempSrc: Oral  Oral   SpO2: 96% 96% 100% 100%  Weight:      Height:        General appearance : Awake, alert, not in any distress. Speech Clear. Not toxic looking HEENT: Atraumatic and Normocephalic, pupils equally reactive to light and accomodation Neck: Supple, no JVD. No cervical lymphadenopathy.  Chest: Good air entry bilaterally, no added sounds  CVS: S1 S2 regular, no murmurs.  Abdomen: Bowel sounds present, Non tender and not distended with no gaurding, rigidity or rebound. Extremities: B/L Lower Ext shows no edema, both legs are warm to touch Neurology: Awake alert, and oriented X 3, CN II-XII intact, Non focal Skin: No Rash  Discharge Instructions  Discharge Instructions     Discharge patient   Complete by: As directed    Discharge disposition: 01-Home or Self Care   Discharge patient date: 02/05/2022      Allergies as of 02/05/2022   No Known Allergies      Medication List     TAKE these medications    dicyclomine 20 MG tablet Commonly known as: BENTYL Take 1 tablet (20 mg total) by mouth 2 (two) times daily.   ergocalciferol 1.25 MG (50000 UT) capsule Commonly known as: VITAMIN D2 Take 1 capsule (50,000 Units total) by mouth once a week. X 12 weeks. What changed: additional instructions   loratadine 10 MG tablet Commonly known as: CLARITIN Take 1 tablet (10 mg total) by mouth daily. What changed:  when to take this reasons to take this   ondansetron 4 MG disintegrating tablet Commonly known as: ZOFRAN-ODT Take 1 tablet (4 mg total) by mouth every 8 (eight) hours as needed for nausea or vomiting. TAKE 1 TABLET BY MOUTH EVERY 8 HOURS AS NEEDED What changed: additional instructions   Oxycodone HCl 10 MG Tabs Take 1 tablet (10 mg total) by mouth every 6 (six) hours as needed.   Oxycodone HCl 10 MG Tabs Take 1 tablet (10 mg total) by mouth every 4 (four) hours as needed for severe pain.   triamcinolone 55 MCG/ACT Aero nasal  inhaler Commonly known as: NASACORT Place 2 sprays into the nose daily. 2 sprays each nostril at night        The results of significant diagnostics from this hospitalization (including imaging, microbiology, ancillary and laboratory) are listed below for reference.    Significant Diagnostic Studies: CT ABDOMEN PELVIS W CONTRAST  Result Date: 02/03/2022 CLINICAL DATA:  Abdominal pain. EXAM: CT ABDOMEN AND PELVIS WITH CONTRAST TECHNIQUE: Multidetector CT imaging of the abdomen and pelvis was performed using the standard protocol following bolus administration of intravenous contrast. RADIATION DOSE REDUCTION: This exam was performed according to the departmental dose-optimization program which includes automated exposure control, adjustment of the mA and/or kV according to patient size and/or use of iterative reconstruction technique. CONTRAST:  04/06/2022 OMNIPAQUE IOHEXOL 300 MG/ML  SOLN COMPARISON:  CT abdomen and pelvis dated October 14, 2021 FINDINGS: Lower chest: No acute abnormality. Hepatobiliary: No focal liver abnormality is seen. No gallstones, gallbladder wall  thickening, or biliary dilatation. Pancreas: Unremarkable. No pancreatic ductal dilatation or surrounding inflammatory changes. Spleen: Unchanged splenomegaly. Adrenals/Urinary Tract: Adrenal glands are unremarkable. Kidneys are normal, without renal calculi, focal lesion, or hydronephrosis. Bladder is unremarkable. Stomach/Bowel: Stomach is within normal limits. Appendix appears normal. No evidence of bowel wall thickening, distention, or inflammatory changes. Vascular/Lymphatic: No significant vascular findings are present. No enlarged abdominal or pelvic lymph nodes. Reproductive: Prostate is unremarkable. Other: No abdominal wall hernia or abnormality. No abdominopelvic ascites. Musculoskeletal: No acute or significant osseous findings. IMPRESSION: 1. No acute findings in the abdomen or pelvis. 2. Stable splenomegaly. Electronically Signed    By: Allegra Lai M.D.   On: 02/03/2022 12:28   DG Chest 2 View  Result Date: 02/03/2022 CLINICAL DATA:  31 year old male with history of sickle cell disease presenting with chest pain and shortness of breath. EXAM: CHEST - 2 VIEW COMPARISON:  Chest x-ray 08/04/2021. FINDINGS: Lung volumes are normal. No consolidative airspace disease. No pleural effusions. No pneumothorax. No pulmonary nodule or mass noted. Pulmonary vasculature and the cardiomediastinal silhouette are within normal limits. IMPRESSION: No radiographic evidence of acute cardiopulmonary disease. Electronically Signed   By: Trudie Reed M.D.   On: 02/03/2022 09:19    Microbiology: No results found for this or any previous visit (from the past 240 hour(s)).   Labs: Basic Metabolic Panel: Recent Labs  Lab 02/03/22 0901 02/04/22 1348 02/05/22 0609  NA 140 136 137  K 3.7 3.4* 3.5  CL 105 99 100  CO2 27 28 31   GLUCOSE 129* 133* 104*  BUN 9 7 7   CREATININE 1.03 0.93 0.99  CALCIUM 10.1 9.9 9.5   Liver Function Tests: Recent Labs  Lab 02/03/22 0901 02/04/22 1348  AST 13* 10*  ALT 24 24  ALKPHOS 57 56  BILITOT 2.2* 2.1*  PROT 9.2* 9.3*  ALBUMIN 5.3* 5.1*   Recent Labs  Lab 02/03/22 0901 02/04/22 1408  LIPASE 28 26   No results for input(s): "AMMONIA" in the last 168 hours. CBC: Recent Labs  Lab 02/03/22 0901 02/04/22 1348 02/05/22 0609  WBC 13.8* 12.3* 9.4  NEUTROABS 10.7* 10.1*  --   HGB 15.5 14.7 13.2  HCT 42.8 39.9 36.9*  MCV 79.3* 78.2* 79.4*  PLT 174 168 113*   Cardiac Enzymes: No results for input(s): "CKTOTAL", "CKMB", "CKMBINDEX", "TROPONINI" in the last 168 hours. BNP: Invalid input(s): "POCBNP" CBG: Recent Labs  Lab 02/03/22 1050  GLUCAP 134*    Time coordinating discharge: 30 minutes  Signed:  02/07/22  APRN, MSN, FNP-C Patient Care [facility] [address] [place], [address] [place] [phone]  [facility] 02/05/2022, 1:43 PM

## 2022-02-05 NOTE — TOC Transition Note (Signed)
Transition of Care [facility]) - CM/SW Discharge Note   Patient Details  Name: Austin [name] MRN: [id number] Date of Birth: 06/01/1991  Transition of Care [facility]) CM/SW Contact:  Bartholome Bill, RN Phone Number: 02/05/2022, 10:17 AM   Clinical Narrative:    [facility] consult for medication assistance. Spoke with pt about medications. He states that he is on his wife's insurance and is having trouble with copays as he is not working right now. Unfortunately TOC is unable to assist with copays. We discussed him applying for Medicare/Medicaid and disability for his sickle cell. He is working through the process and plans to follow up at the [employer] and Department of Social Services at [employer].

## 2022-02-08 ENCOUNTER — Other Ambulatory Visit: Payer: Self-pay | Admitting: *Deleted

## 2022-02-08 NOTE — Patient Outreach (Signed)
  Care Coordination [facility] Note Transition Care Management Unsuccessful Follow-up Telephone Call  Date of discharge and from where:  02/05/22 [facility].  Attempts:  1st Attempt  Reason for unsuccessful TCM follow-up call:  Left voice message with the patient when he picked up the phone to explain the nature of the call. Patient stated he is unable to speak at this time. Nurse discussed she would call the patient back later.   Blanchie Serve RN, BSN [facility] Care Management [facility] [phone] [email or web address]

## 2022-02-09 ENCOUNTER — Other Ambulatory Visit: Payer: Self-pay | Admitting: *Deleted

## 2022-02-09 NOTE — Patient Outreach (Signed)
  Care Coordination [facility] Note Transition Care Management Follow-up Telephone Call Date of discharge and from where: 02/05/22 [facility] How have you been since you were released from the hospital? Patient states he is feeling better Any questions or concerns? No  Items Reviewed: Did the pt receive and understand the discharge instructions provided? Yes  Medications obtained and verified? Yes  Other? No  Any new allergies since your discharge? No  Dietary orders reviewed? Yes Do you have support at home? Yes   Home Care and Equipment/Supplies: Were home health services ordered? no If so, what is the name of the agency? N/A  Has the agency set up a time to come to the patient's home? not applicable Were any new equipment or medical supplies ordered?  No What is the name of the medical supply agency? N/A Were you able to get the supplies/equipment? not applicable Do you have any questions related to the use of the equipment or supplies? No  Functional Questionnaire: (I = Independent and D = Dependent) ADLs: I  Bathing/Dressing- I  Meal Prep- I  Eating- I  Maintaining continence- I  Transferring/Ambulation- I  Managing Meds- I  Follow up appointments reviewed:  PCP Hospital f/u appt confirmed? No  , nurse will email Hulan Amato. Specialist Hospital f/u appt confirmed? No   Are transportation arrangements needed? No  If their condition worsens, is the pt aware to call PCP or go to the Emergency Dept.? Yes Was the patient provided with contact information for the PCP's office or ED? Yes Was to pt encouraged to call back with questions or concerns? Yes  SDOH assessments and interventions completed:   No  Care Coordination Interventions Activated:  No Care Coordination Interventions:   N/A  Encounter Outcome:  Pt. Visit Completed  Blanchie Serve RN, BSN [facility] Care Management [facility] [phone] [email or web address]

## 2022-02-10 ENCOUNTER — Other Ambulatory Visit: Payer: Self-pay

## 2022-02-20 ENCOUNTER — Other Ambulatory Visit: Payer: Self-pay | Admitting: Family Medicine

## 2022-02-20 DIAGNOSIS — G894 Chronic pain syndrome: Secondary | ICD-10-CM

## 2022-02-20 DIAGNOSIS — D571 Sickle-cell disease without crisis: Secondary | ICD-10-CM

## 2022-02-20 MED ORDER — OXYCODONE HCL 10 MG PO TABS
10.0000 mg | ORAL_TABLET | Freq: Four times a day (QID) | ORAL | 0 refills | Status: DC | PRN
Start: 2022-02-20 — End: 2022-03-30

## 2022-02-20 NOTE — Progress Notes (Signed)
Reviewed PDMP substance reporting system prior to prescribing opiate medications. No inconsistencies noted.  Meds ordered this encounter  Medications   Oxycodone HCl 10 MG TABS    Sig: Take 1 tablet (10 mg total) by mouth every 6 (six) hours as needed.    Dispense:  60 tablet    Refill:  0    Order Specific Question:   Supervising Provider    Answer:   JEGEDE, OLUGBEMIGA E [[id number]]   Austin Vipond Moore Maylea Soria  APRN, MSN, FNP-C [facility] [address]  [place],  [address] [phone]  

## 2022-03-08 ENCOUNTER — Other Ambulatory Visit (HOSPITAL_COMMUNITY): Payer: Self-pay

## 2022-03-09 ENCOUNTER — Other Ambulatory Visit (HOSPITAL_COMMUNITY): Payer: Self-pay

## 2022-03-18 ENCOUNTER — Other Ambulatory Visit: Payer: Self-pay | Admitting: Internal Medicine

## 2022-03-18 DIAGNOSIS — D571 Sickle-cell disease without crisis: Secondary | ICD-10-CM

## 2022-03-18 DIAGNOSIS — G894 Chronic pain syndrome: Secondary | ICD-10-CM

## 2022-03-25 ENCOUNTER — Other Ambulatory Visit: Payer: Self-pay | Admitting: Nurse Practitioner

## 2022-03-25 ENCOUNTER — Ambulatory Visit (INDEPENDENT_AMBULATORY_CARE_PROVIDER_SITE_OTHER): Payer: Commercial Managed Care - PPO | Admitting: Internal Medicine

## 2022-03-25 ENCOUNTER — Encounter: Payer: Self-pay | Admitting: Internal Medicine

## 2022-03-25 VITALS — BP 128/82 | HR 87 | Temp 99.7°F | Ht 67.0 in | Wt 181.0 lb

## 2022-03-25 DIAGNOSIS — G894 Chronic pain syndrome: Secondary | ICD-10-CM

## 2022-03-25 DIAGNOSIS — D571 Sickle-cell disease without crisis: Secondary | ICD-10-CM

## 2022-03-25 DIAGNOSIS — F419 Anxiety disorder, unspecified: Secondary | ICD-10-CM

## 2022-03-25 MED ORDER — LORAZEPAM 0.5 MG PO TABS: 0.5000 mg | ORAL_TABLET | Freq: Every day | ORAL | 0 refills | Status: DC

## 2022-03-25 NOTE — Progress Notes (Signed)
Follow up Request referral for therapy

## 2022-03-25 NOTE — Progress Notes (Signed)
 Austin [name], is a 31 y.o. male  RDW:[id number]  FMW:[id number]  DOB - 1990-07-31  Chief Complaint  Patient presents with   Anxiety       Subjective:   Austin [name] is a 31 y.o. male with a medical history that is significant for sickle cell disease, chronic pain syndrome, opiate dependence and tolerance, and anemia of chronic disease, who presented here today for a routine follow up visit.  His major concern today is about his marriage.  He is concerned that there may have been a talk partly in the marriage and he would like to make up with his wife.  He is requesting marriage counseling.  He has no significant pain from his sickle cell disease.  Adherent with his medications.  Patient has No headache, No chest pain, No abdominal pain - No Nausea, No new weakness tingling or numbness, No Cough - SOB.  No problems updated.  ALLERGIES: No Known Allergies  PAST MEDICAL HISTORY: Past Medical History:  Diagnosis Date   Acute maxillary sinusitis    Eczema    Sickle cell anemia (HCC) .    MEDICATIONS AT HOME: Prior to Admission medications   Medication Sig Start Date End Date Taking? Authorizing Provider  dicyclomine  (BENTYL ) 20 MG tablet Take 1 tablet (20 mg total) by mouth 2 (two) times daily. 03/23/21 03/25/22 Yes King, Camelia HERO, NP  ergocalciferol  (VITAMIN D2) 1.25 MG (50000 UT) capsule Take 1 capsule (50,000 Units total) by mouth once a week. X 12 weeks. Patient taking differently: Take 50,000 Units by mouth once a week. 05/27/21 05/27/22 Yes Myrna Camelia HERO, NP  loratadine  (CLARITIN ) 10 MG tablet Take 1 tablet (10 mg total) by mouth daily. Patient taking differently: Take 10 mg by mouth daily as needed for allergies or rhinitis. 11/13/20  Yes Myrna Camelia HERO, NP  LORazepam  (ATIVAN ) 0.5 MG tablet Take 1 tablet (0.5 mg total) by mouth at bedtime. 03/25/22  Yes Lusero Nordlund E, MD  ondansetron  (ZOFRAN -ODT) 4 MG disintegrating tablet Take 1 tablet (4 mg total) by mouth  every 8 (eight) hours as needed for nausea or vomiting. TAKE 1 TABLET BY MOUTH EVERY 8 HOURS AS NEEDED Patient taking differently: Take 4 mg by mouth every 8 (eight) hours as needed for nausea or vomiting. 12/02/21  Yes Loye Reininger E, MD  Oxycodone  HCl 10 MG TABS Take 1 tablet (10 mg total) by mouth every 6 (six) hours as needed. 02/20/22  Yes Tilford Bertram HERO, FNP  triamcinolone  (NASACORT ) 55 MCG/ACT AERO nasal inhaler Place 2 sprays into the nose daily. 2 sprays each nostril at night 04/27/21  Yes Ethyl Lonni BRAVO, MD    Objective:   Vitals:   03/25/22 0908 03/25/22 0923  BP: (!) 145/84 128/82  Pulse: 87   Temp: 99.7 F (37.6 C)   SpO2: 99%   Weight: 181 lb (82.1 kg)   Height: 5' 7 (1.702 m)    Exam General appearance : Awake, alert, not in any distress. Speech Clear. Not toxic looking HEENT: Atraumatic and Normocephalic, pupils equally reactive to light and accomodation Neck: Supple, no JVD. No cervical lymphadenopathy.  Chest: Good air entry bilaterally, no added sounds  CVS: S1 S2 regular, no murmurs.  Abdomen: Bowel sounds present, Non tender and not distended with no gaurding, rigidity or rebound. Extremities: B/L Lower Ext shows no edema, both legs are warm to touch Neurology: Awake alert, and oriented X 3, CN II-XII intact, Non focal Skin: No Rash  Data Review Lab  Results  Component Value Date   HGBA1C 4.2 08/12/2017    Assessment & Plan   1. Chronic pain syndrome We will check - Sickle Cell Panel  We agreed on Opiate dose and amount of pills  per month. We discussed that pt is to receive Schedule II prescriptions only from our clinic. Pt is also aware that the prescription history is available to us  online through the [facility] CSRS. Controlled substance agreement reviewed and signed. We reminded Austin [name] that all patients receiving Schedule II narcotics must be seen for follow within one month of prescription being requested. We reviewed the terms of our pain  agreement, including the need to keep medicines in a safe locked location away from children or pets, and the need to report excess sedation or constipation, measures to avoid constipation, and policies related to early refills and stolen prescriptions. According to the [place] Chronic Pain Initiative program, we have reviewed details related to analgesia, adverse effects and aberrant behaviors.  2. Sickle-cell disease without crisis (HCC) Check  - Sickle Cell Panel  Sickle cell disease - We discussed the need for good hydration, monitoring of hydration status, avoidance of heat, cold, stress, and infection triggers. The patient was reminded of the need to seek medical attention for any symptoms of bleeding, anemia, or infection. Continue folic acid  1 mg daily.  Pulmonary evaluation - Patient denies severe recurrent wheezes, shortness of breath with exercise, or persistent cough. If these symptoms develop, pulmonary function tests with spirometry will be ordered, and if abnormal, plan on referral to Pulmonology for further evaluation.  Eye - High risk of proliferative retinopathy. Annual eye exam with retinal exam recommended to patient, the patient has had eye exam this year.  Immunization status - Yearly influenza vaccination is recommended, as well as being up to date with Meningococcal and Pneumococcal vaccines.   Patient voiced understanding and agreeable to plan of care. No barriers to understanding were noted.  3.  Domestic stress with anxiety Ambulatory referral for marriage counseling.  - [facility] FAMILY/COUPLE COUNSELING  Patient have been counseled extensively about nutrition and exercise. Other issues discussed during this visit include: low cholesterol diet, annual eye examinations at Ophthalmology, importance of adherence with medications and regular follow-up.   Return in about 3 months (around 06/24/2022) for Sickle Cell Disease/Pain.  The patient was given clear instructions to go to  ER or return to medical center if symptoms don't improve, worsen or new problems develop. The patient verbalized understanding. The patient was told to call to get lab results if they haven't heard anything in the next week.   This note has been created with Education officer, environmental. Any transcriptional errors are unintentional.    Precious Schatz, MD, MHA, GENI BEL, CPE [facility] [place], [place] [phone]   03/25/2022, 9:52 AM

## 2022-03-26 LAB — CMP14+CBC/D/PLT+FER+RETIC+V...
ALT: 22 IU/L (ref 0–44)
AST: 13 IU/L (ref 0–40)
Albumin/Globulin Ratio: 1.5 (ref 1.2–2.2)
Albumin: 4.9 g/dL (ref 4.1–5.1)
Alkaline Phosphatase: 64 IU/L (ref 44–121)
BUN/Creatinine Ratio: 7 — ABNORMAL LOW (ref 9–20)
BUN: 7 mg/dL (ref 6–20)
Basophils Absolute: 0.1 10*3/uL (ref 0.0–0.2)
Basos: 1 %
Bilirubin Total: 1.3 mg/dL — ABNORMAL HIGH (ref 0.0–1.2)
CO2: 26 mmol/L (ref 20–29)
Calcium: 9.9 mg/dL (ref 8.7–10.2)
Chloride: 99 mmol/L (ref 96–106)
Creatinine, Ser: 1.02 mg/dL (ref 0.76–1.27)
EOS (ABSOLUTE): 0.3 10*3/uL (ref 0.0–0.4)
Eos: 3 %
Ferritin: 467 ng/mL — ABNORMAL HIGH (ref 30–400)
Globulin, Total: 3.3 g/dL (ref 1.5–4.5)
Glucose: 122 mg/dL — ABNORMAL HIGH (ref 70–99)
Hematocrit: 48.6 % (ref 37.5–51.0)
Hemoglobin: 17.1 g/dL (ref 13.0–17.7)
Immature Grans (Abs): 0 10*3/uL (ref 0.0–0.1)
Immature Granulocytes: 0 %
Lymphocytes Absolute: 1.2 10*3/uL (ref 0.7–3.1)
Lymphs: 13 %
MCH: 31 pg (ref 26.6–33.0)
MCHC: 35.2 g/dL (ref 31.5–35.7)
MCV: 88 fL (ref 79–97)
Monocytes Absolute: 0.5 10*3/uL (ref 0.1–0.9)
Monocytes: 6 %
Neutrophils Absolute: 7 10*3/uL (ref 1.4–7.0)
Neutrophils: 77 %
Platelets: 219 10*3/uL (ref 150–450)
Potassium: 4.2 mmol/L (ref 3.5–5.2)
RBC: 5.52 x10E6/uL (ref 4.14–5.80)
RDW: 11.5 % — ABNORMAL LOW (ref 11.6–15.4)
Retic Ct Pct: 1.7 % (ref 0.6–2.6)
Sodium: 138 mmol/L (ref 134–144)
Total Protein: 8.2 g/dL (ref 6.0–8.5)
Vit D, 25-Hydroxy: 32.9 ng/mL (ref 30.0–100.0)
WBC: 9 10*3/uL (ref 3.4–10.8)
eGFR: 100 mL/min/{1.73_m2} (ref >59)

## 2022-03-30 ENCOUNTER — Telehealth: Payer: Self-pay

## 2022-03-30 ENCOUNTER — Other Ambulatory Visit: Payer: Self-pay | Admitting: Internal Medicine

## 2022-03-30 ENCOUNTER — Ambulatory Visit (INDEPENDENT_AMBULATORY_CARE_PROVIDER_SITE_OTHER): Payer: Self-pay | Admitting: Clinical

## 2022-03-30 DIAGNOSIS — G894 Chronic pain syndrome: Secondary | ICD-10-CM

## 2022-03-30 DIAGNOSIS — D571 Sickle-cell disease without crisis: Secondary | ICD-10-CM

## 2022-03-30 DIAGNOSIS — F419 Anxiety disorder, unspecified: Secondary | ICD-10-CM

## 2022-03-30 MED ORDER — OXYCODONE HCL 10 MG PO TABS
10.0000 mg | ORAL_TABLET | Freq: Four times a day (QID) | ORAL | 0 refills | Status: DC | PRN
Start: 2022-03-30 — End: 2022-04-16

## 2022-03-30 NOTE — Telephone Encounter (Signed)
Oxycodone  °

## 2022-03-31 NOTE — BH Specialist Note (Signed)
Integrated Behavioral Health Initial In-Person Visit  MRN: [id number] Name: Austin [name]  Number of Integrated Behavioral Health Clinician visits: 1- Initial Visit  Session Start time: 1410    Session End time: 1505  Total time in minutes: 55   Types of Service: Individual psychotherapy  Interpretor:No. Interpretor Name and Language: none  Subjective: Austin [name] is a 31 y.o. male accompanied by  self. Patient was referred by Austin Bernheim, MD for anxiety related to interpersonal problems with spouse. 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 Life Changes: recently discovered spouse's infidelity  Patient and/or Family's Strengths/Protective Factors: Social connections, Social and Patent attorney, Concrete supports in place (healthy food, safe environments, etc.), and Sense of purpose  Goals Addressed: Patient will: Reduce symptoms of: anxiety Increase knowledge and/or ability of: coping skills and self-management skills  Demonstrate ability to: Increase healthy adjustment to current life circumstances  Progress towards Goals: Ongoing  Interventions: Interventions utilized: CBT Cognitive Behavioral Therapy and Supportive Counseling  Standardized Assessments completed: Not Needed  Assessment and supportive counseling today. Patient discussed his relationship with spouse. Some CBT to explore patient's thoughts and behaviors in context of relationship with spouse and discussed limitations of individual counseling in this situation.  Patient and/or Family Response:  Patient engaged in session.   Patient Centered Plan: Patient is on the following Treatment Plan(s):  CBT for anxiety and solution focused therapy   Assessment: Patient currently experiencing interpersonal difficulties with spouse. Patient has discovered that spouse was unfaithful. He is hopeful for marriage counseling, but spouse as not yet agreed to this.    Patient may benefit from CBT to explore beliefs and emotions related to relationship with spouse. He may also benefit from some exploration of personal attachment and communications styles.  Plan: Follow up with behavioral health clinician on : 04/09/22 Referral(s): Integrated [employer] (In Clinic)  Austin Butts, LCSW [facility] [phone]

## 2022-04-09 ENCOUNTER — Ambulatory Visit: Payer: Self-pay | Admitting: Clinical

## 2022-04-13 ENCOUNTER — Other Ambulatory Visit: Payer: Self-pay | Admitting: Nurse Practitioner

## 2022-04-13 DIAGNOSIS — E559 Vitamin D deficiency, unspecified: Secondary | ICD-10-CM

## 2022-04-16 ENCOUNTER — Telehealth: Payer: Self-pay

## 2022-04-16 ENCOUNTER — Other Ambulatory Visit: Payer: Self-pay | Admitting: Family Medicine

## 2022-04-16 DIAGNOSIS — D571 Sickle-cell disease without crisis: Secondary | ICD-10-CM

## 2022-04-16 DIAGNOSIS — G894 Chronic pain syndrome: Secondary | ICD-10-CM

## 2022-04-16 MED ORDER — OXYCODONE HCL 10 MG PO TABS: 10.0000 mg | ORAL_TABLET | Freq: Four times a day (QID) | ORAL | 0 refills | Status: DC | PRN

## 2022-04-16 NOTE — Telephone Encounter (Signed)
Oxycodone  Ondansetron

## 2022-04-16 NOTE — Progress Notes (Signed)
Reviewed PDMP substance reporting system prior to prescribing opiate medications. No inconsistencies noted.  Meds ordered this encounter  Medications   Oxycodone HCl 10 MG TABS    Sig: Take 1 tablet (10 mg total) by mouth every 6 (six) hours as needed.    Dispense:  60 tablet    Refill:  0    Order Specific Question:   Supervising Provider    Answer:   JEGEDE, OLUGBEMIGA E [[id number]]   Austin Rambeau Moore Kacee Koren  APRN, MSN, FNP-C [facility] [address]  [place], [place] [address] [phone]  

## 2022-04-20 ENCOUNTER — Ambulatory Visit
Admission: RE | Admit: 2022-04-20 | Discharge: 2022-04-20 | Disposition: A | Discharge status: Home / Self Care | Payer: 59 | Source: Ambulatory Visit

## 2022-04-20 ENCOUNTER — Ambulatory Visit (INDEPENDENT_AMBULATORY_CARE_PROVIDER_SITE_OTHER): Payer: 59

## 2022-04-20 VITALS — BP 125/76 | HR 77 | Temp 97.8°F | Resp 18

## 2022-04-20 DIAGNOSIS — M25512 Pain in left shoulder: Secondary | ICD-10-CM

## 2022-04-20 MED ORDER — PREDNISONE 20 MG PO TABS: 40.0000 mg | ORAL_TABLET | Freq: Every day | ORAL | 0 refills | Status: AC

## 2022-04-20 MED ORDER — CYCLOBENZAPRINE HCL 10 MG PO TABS: 10.0000 mg | ORAL_TABLET | Freq: Two times a day (BID) | ORAL | 0 refills | Status: DC | PRN

## 2022-04-20 NOTE — ED Triage Notes (Signed)
Pt  presents to [facility] with co of shoulder pain since he was mowing his lawn. Pt reports he was mowing the lawn and reached back to swat a bee away and ever since has had pain. Wife has been using massage therapy which helped some but pain persist. Pt also used aleve and Biofreeze with minimal improvement. Pain is 9/10 right now. Pain is worse with movement.

## 2022-04-20 NOTE — ED Provider Notes (Signed)
[facility]    CSN: [id number] Arrival date & time: 04/20/22  0858      History   Chief Complaint Chief Complaint  Patient presents with   Shoulder Pain    Pain x 2 days May need x ray - Entered by patient    HPI Austin [name] is a 31 y.o. male.   Patient here today for evaluation of left shoulder pain that started a few days ago after he was trying to swat a bee while mowing his yard.  He reports pain is worse with movement.  Wife has been trying to massage with some improvement but no resolution.  He has also tried using Aleve and oxycodone without resolution of pain.  He denies any numbness or tingling.   The history is provided by the patient.  Shoulder Pain Associated symptoms: no fever     Past Medical History:  Diagnosis Date   Acute maxillary sinusitis    Eczema    Sickle cell anemia (HCC) .    Patient Active Problem List   Diagnosis Date Noted   Hypokalemia 03/05/2021   Anemia of chronic disease 03/05/2021   Vitamin D deficiency 11/16/2020   Bilateral lower extremity pain 10/17/2019   Sickle cell anemia without crisis (HCC) 09/20/2019   Chronic pain syndrome 09/20/2019   Nausea 09/20/2019   Chronic, continuous use of opioids 09/20/2019   Medication management 03/20/2019   Bilious vomiting without nausea    Viral gastroenteritis 01/23/2019   Nausea vomiting and diarrhea    Generalized abdominal pain    Dehydration    Sickle cell pain crisis (HCC) 05/18/2018   Acute sickle cell crisis (HCC) 12/15/2017   Thrombocytopenia (HCC) 12/15/2017   Splenomegaly 12/15/2017   Sickle cell anemia with crisis (HCC) 12/15/2017   Spleen enlarged 08/18/2017    Past Surgical History:  Procedure Laterality Date   FOOT SURGERY Left 12/25/2020   WISDOM TOOTH EXTRACTION         Home Medications    Prior to Admission medications   Medication Sig Start Date End Date Taking? Authorizing Provider  cyclobenzaprine (FLEXERIL) 10 MG tablet Take 1  tablet (10 mg total) by mouth 2 (two) times daily as needed for muscle spasms. 04/20/22  Yes Tomi Bamberger, PA-C  oxycodone-acetaminophen (LYNOX) 10-300 MG tablet Take 1 tablet by mouth every 4 (four) hours as needed for pain.   Yes [provider]  predniSONE (DELTASONE) 20 MG tablet Take 2 tablets (40 mg total) by mouth daily with breakfast for 5 days. 04/20/22 04/25/22 Yes Tomi Bamberger, PA-C  dicyclomine (BENTYL) 20 MG tablet TAKE 1 TABLET BY MOUTH TWICE A DAY 03/25/22   Quentin Angst, MD  loratadine (CLARITIN) 10 MG tablet Take 1 tablet (10 mg total) by mouth daily. Patient taking differently: Take 10 mg by mouth daily as needed for allergies or rhinitis. 11/13/20   Barbette Merino, NP  LORazepam (ATIVAN) 0.5 MG tablet Take 1 tablet (0.5 mg total) by mouth at bedtime. 03/25/22   Quentin Angst, MD  ondansetron (ZOFRAN-ODT) 4 MG disintegrating tablet Take 1 tablet (4 mg total) by mouth every 8 (eight) hours as needed for nausea or vomiting. TAKE 1 TABLET BY MOUTH EVERY 8 HOURS AS NEEDED Patient taking differently: Take 4 mg by mouth every 8 (eight) hours as needed for nausea or vomiting. 12/02/21   Quentin Angst, MD  Oxycodone HCl 10 MG TABS Take 1 tablet (10 mg total) by mouth every 6 (  six) hours as needed. 04/16/22   Dorena Dew, FNP  triamcinolone (NASACORT) 55 MCG/ACT AERO nasal inhaler Place 2 sprays into the nose daily. 2 sprays each nostril at night 04/27/21   Rozetta Nunnery, MD  Vitamin D, Ergocalciferol, (DRISDOL) 1.25 MG (50000 UNIT) CAPS capsule TAKE 1 CAPSULE (50,000 UNITS TOTAL) BY MOUTH ONCE A WEEK. X 12 WEEKS. 04/13/22   Fenton Foy, NP    Family History Family History  Problem Relation Age of Onset   Sickle cell trait Mother    Diabetes Father    Hypertension Father     Social History Social History   Tobacco Use   Smoking status: Former    Packs/day: 0.10    Types: Cigarettes    Quit date: 11/23/2013    Years since quitting:  8.4   Smokeless tobacco: Never  Vaping Use   Vaping Use: Never used  Substance Use Topics   Alcohol use: Not Currently   Drug use: Yes    Types: Marijuana    Comment: occasionally     Allergies   Patient has no known allergies.   Review of Systems Review of Systems  Constitutional:  Negative for chills and fever.  Eyes:  Negative for discharge and redness.  Respiratory:  Negative for shortness of breath.   Musculoskeletal:  Positive for myalgias. Negative for arthralgias.  Neurological:  Negative for numbness.     Physical Exam Triage Vital Signs ED Triage Vitals [04/20/22 0922]  Enc Vitals Group     BP      Pulse      Resp      Temp      Temp src      SpO2      Weight      Height      Head Circumference      Peak Flow      Pain Score 9     Pain Loc      Pain Edu?      Excl. in [place]?    No data found.  Updated Vital Signs BP 125/76   Pulse 77   Temp 97.8 F (36.6 C)   Resp 18   SpO2 98%    Physical Exam Vitals and nursing note reviewed.  Constitutional:      General: He is not in acute distress.    Appearance: Normal appearance. He is not ill-appearing.  HENT:     Head: Normocephalic and atraumatic.  Eyes:     Conjunctiva/sclera: Conjunctivae normal.  Cardiovascular:     Rate and Rhythm: Normal rate.  Pulmonary:     Effort: Pulmonary effort is normal.  Musculoskeletal:     Comments: Mildly decreased ROM of left shoulder in all directions due to reported pain, TTP noted to lower left trapezius  Neurological:     Mental Status: He is alert.  Psychiatric:        Mood and Affect: Mood normal.        Behavior: Behavior normal.        Thought Content: Thought content normal.      UC Treatments / Results  Labs (all labs ordered are listed, but only abnormal results are displayed) Labs Reviewed - No data to display  EKG   Radiology DG Shoulder Left  Result Date: 04/20/2022 CLINICAL DATA:  Shoulder pain. EXAM: LEFT SHOULDER - 2+ VIEW  COMPARISON:  None Available. FINDINGS: There is no evidence of fracture or dislocation. There is no evidence of arthropathy  or other focal bone abnormality. Soft tissues are unremarkable. IMPRESSION: Negative. Electronically Signed   By: Larose Hires D.O.   On: 04/20/2022 09:48    Procedures Procedures (including critical care time)  Medications Ordered in UC Medications - No data to display  Initial Impression / Assessment and Plan / UC Course  I have reviewed the triage vital signs and the nursing notes.  Pertinent labs & imaging results that were available during my care of the patient were reviewed by me and considered in my medical decision making (see chart for details).    Xray without fracture- suspect muscle strain and prescribed muscle relaxer and prednisone. Recommended follow up with ortho if no gradual improvement.   Final Clinical Impressions(s) / UC Diagnoses   Final diagnoses:  Acute pain of left shoulder   Discharge Instructions   None    ED Prescriptions     Medication Sig Dispense Auth. Provider   predniSONE (DELTASONE) 20 MG tablet Take 2 tablets (40 mg total) by mouth daily with breakfast for 5 days. 10 tablet Erma Pinto F, PA-C   cyclobenzaprine (FLEXERIL) 10 MG tablet Take 1 tablet (10 mg total) by mouth 2 (two) times daily as needed for muscle spasms. 20 tablet Tomi Bamberger, PA-C      PDMP not reviewed this encounter.   Tomi Bamberger, PA-C 04/20/22 1020

## 2022-04-29 ENCOUNTER — Other Ambulatory Visit: Payer: Self-pay | Admitting: Family Medicine

## 2022-04-29 DIAGNOSIS — G894 Chronic pain syndrome: Secondary | ICD-10-CM

## 2022-04-29 DIAGNOSIS — D571 Sickle-cell disease without crisis: Secondary | ICD-10-CM

## 2022-04-29 MED ORDER — OXYCODONE HCL 10 MG PO TABS: 10.0000 mg | ORAL_TABLET | Freq: Four times a day (QID) | ORAL | 0 refills | Status: AC | PRN

## 2022-04-29 NOTE — Progress Notes (Signed)
Reviewed PDMP substance reporting system prior to prescribing opiate medications. No inconsistencies noted.   Meds ordered this encounter  Medications  . Oxycodone HCl 10 MG TABS    Sig: Take 1 tablet (10 mg total) by mouth every 6 (six) hours as needed for up to 15 days.    Dispense:  60 tablet    Refill:  0    Order Specific Question:   Supervising Provider    Answer:   JEGEDE, OLUGBEMIGA E [[id number]]     Austin Lenoir Moore Pietra Zuluaga  APRN, MSN, FNP-C [facility] [address]  [place], [place] [address] [phone]  

## 2022-05-15 IMAGING — DX DG FOOT COMPLETE 3+V*L*
3 series · 3 of 3 positions shown · non-contrast
Comparison: None.

CLINICAL DATA: Pain with bruising in the mid plantar surface of the
left foot.

EXAM:
LEFT FOOT - COMPLETE 3+ VIEW

[foot ap]
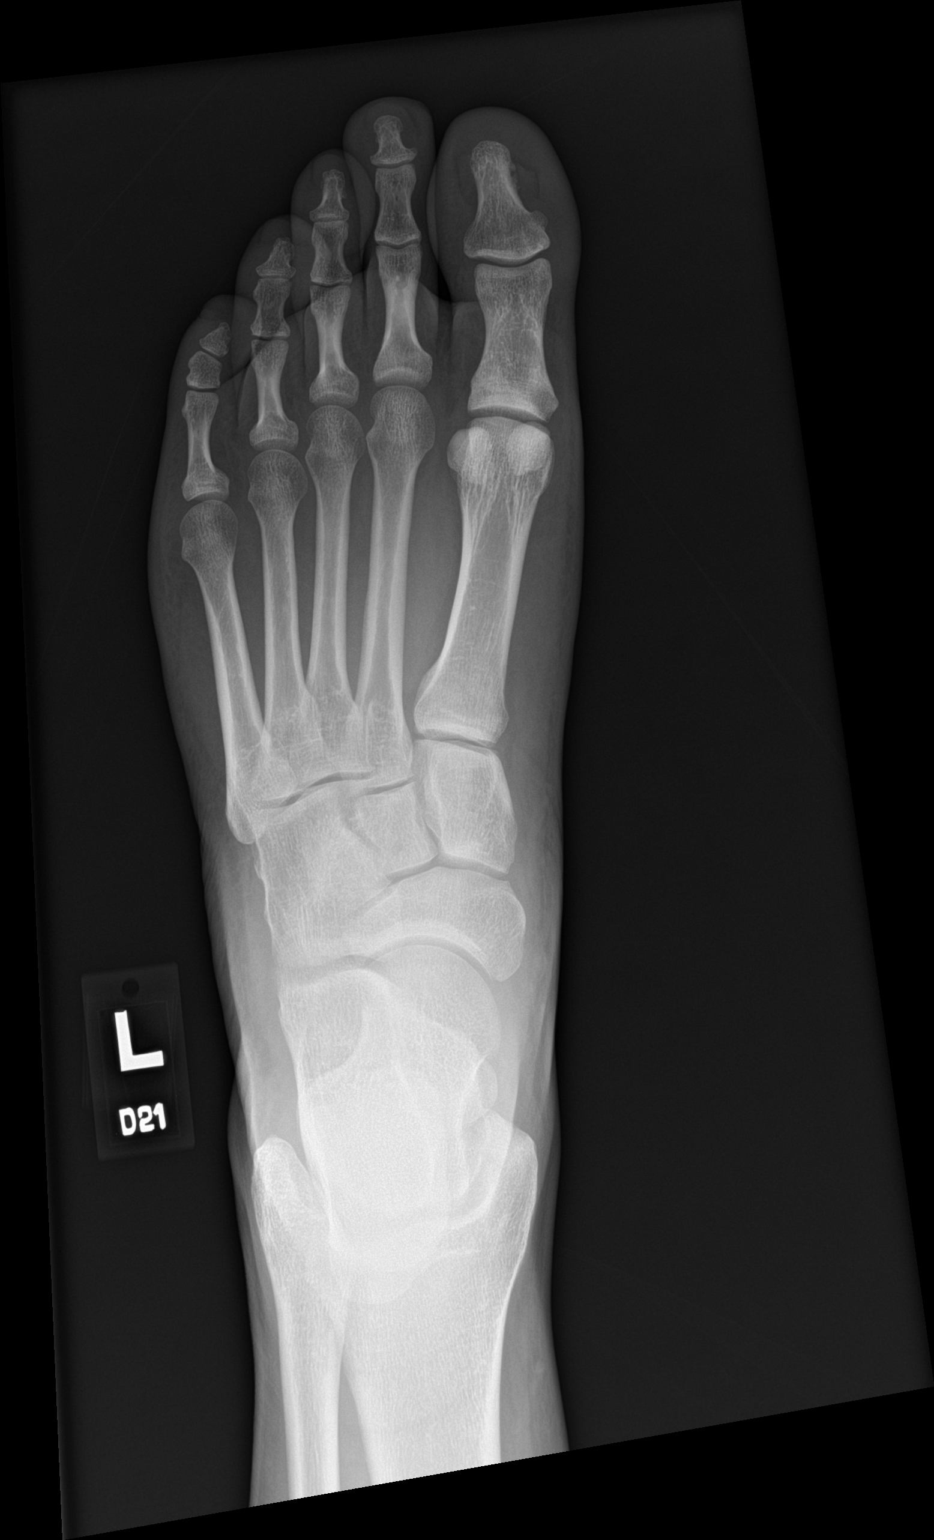

[foot obl]
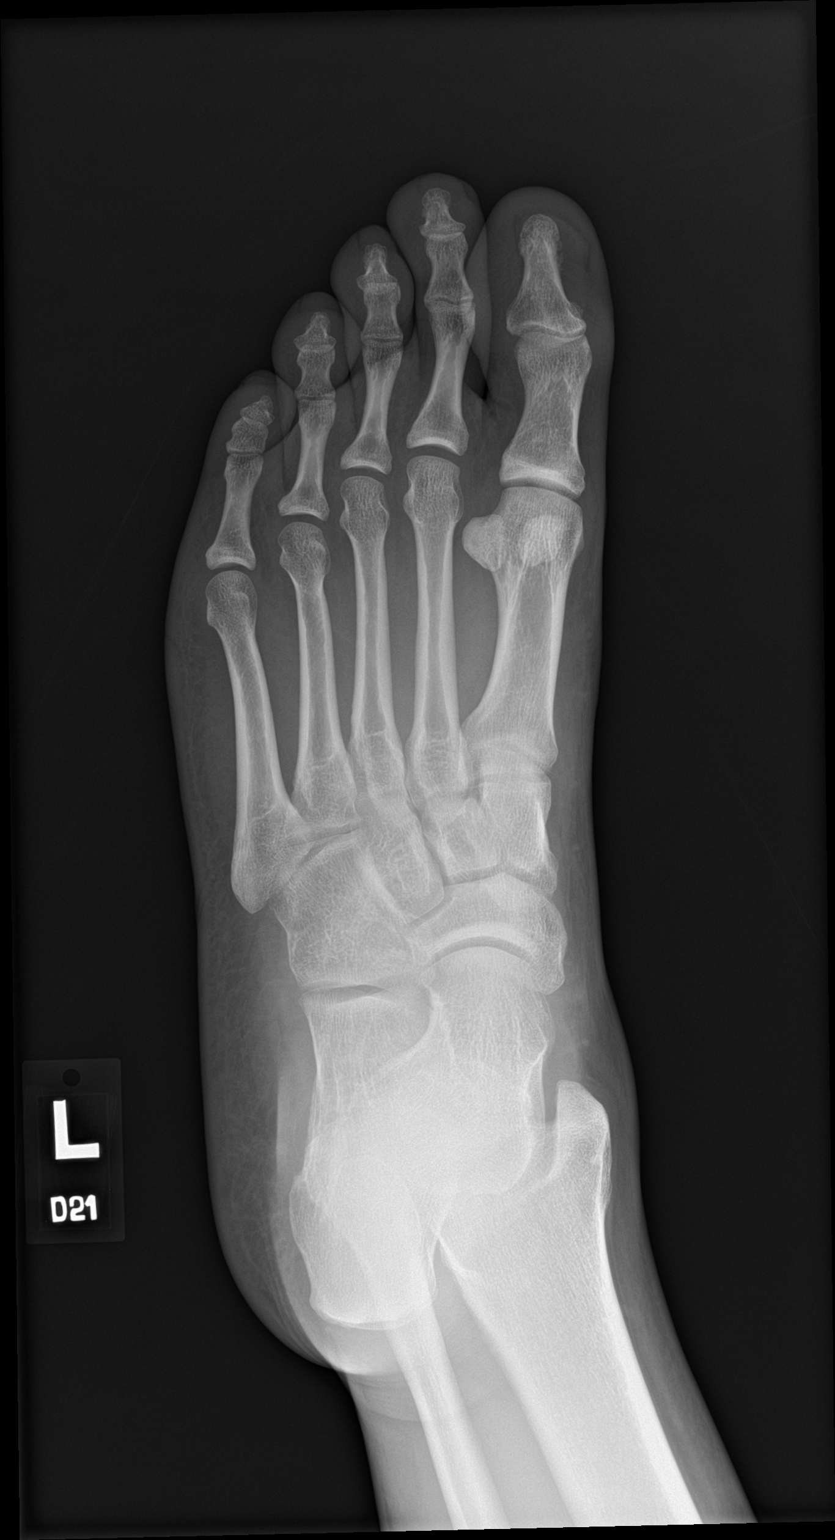

[foot lat]
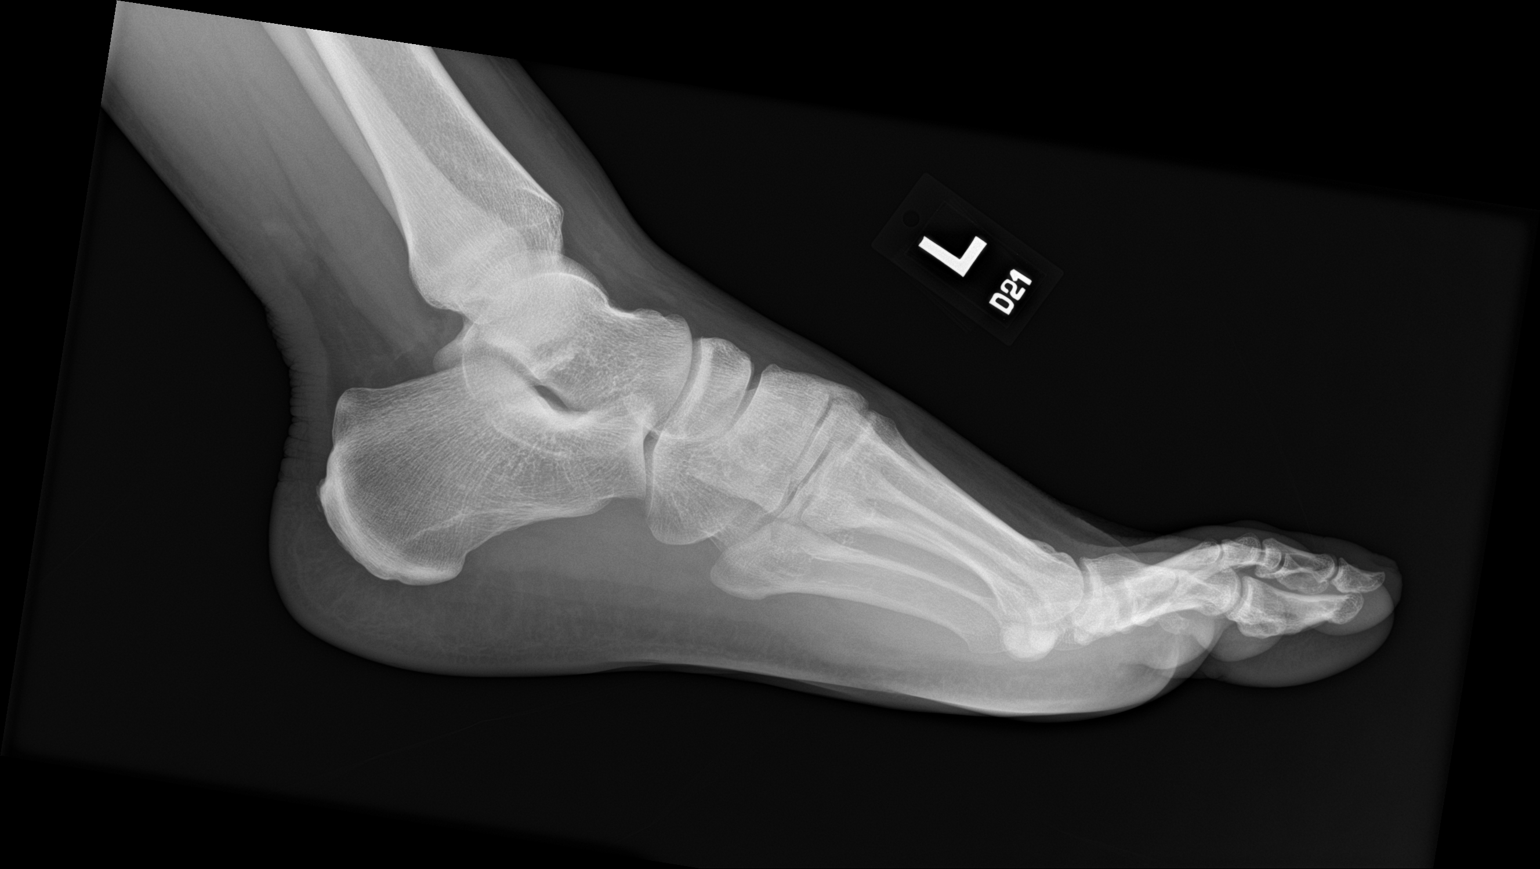

[3 of 3 positions shown; findings below may reference images not displayed]

FINDINGS: There is no evidence of fracture or dislocation. There is no
evidence of arthropathy or other focal bone abnormality. Soft
tissues are unremarkable.
IMPRESSION: Negative.

## 2022-05-18 ENCOUNTER — Other Ambulatory Visit: Payer: Self-pay

## 2022-05-20 ENCOUNTER — Telehealth: Payer: Self-pay

## 2022-05-20 ENCOUNTER — Other Ambulatory Visit: Payer: Self-pay | Admitting: Family Medicine

## 2022-05-20 DIAGNOSIS — G894 Chronic pain syndrome: Secondary | ICD-10-CM

## 2022-05-20 DIAGNOSIS — D571 Sickle-cell disease without crisis: Secondary | ICD-10-CM

## 2022-05-20 MED ORDER — OXYCODONE-ACETAMINOPHEN 10-300 MG PO TABS: 1.0000 | ORAL_TABLET | ORAL | 0 refills | Status: DC | PRN

## 2022-05-20 NOTE — Progress Notes (Signed)
Reviewed PDMP substance reporting system prior to prescribing opiate medications. No inconsistencies noted.  Meds ordered this encounter  Medications   oxycodone-acetaminophen (LYNOX) 10-300 MG tablet    Sig: Take 1 tablet by mouth every 4 (four) hours as needed for pain.    Dispense:  60 tablet    Refill:  0    Order Specific Question:   Supervising Provider    Answer:   Tresa Garter [[id number]]   [name]  APRN, MSN, FNP-C [facility] [address] [place], [place] [address] [phone]

## 2022-05-20 NOTE — Telephone Encounter (Signed)
Oxycodone  °

## 2022-05-25 ENCOUNTER — Telehealth: Payer: Self-pay

## 2022-05-25 ENCOUNTER — Other Ambulatory Visit: Payer: Self-pay | Admitting: Family Medicine

## 2022-05-25 NOTE — Telephone Encounter (Signed)
Oxycodone HCL 10 mg correct med to be refilled

## 2022-05-31 ENCOUNTER — Telehealth: Payer: Self-pay | Admitting: Nurse Practitioner

## 2022-05-31 ENCOUNTER — Other Ambulatory Visit: Payer: Self-pay | Admitting: Nurse Practitioner

## 2022-05-31 NOTE — Telephone Encounter (Signed)
Error

## 2022-05-31 NOTE — Telephone Encounter (Signed)
[facility] is requesting to fill pt zofran. Please advise [facility]

## 2022-06-01 ENCOUNTER — Emergency Department (HOSPITAL_COMMUNITY)
Admission: EM | Admit: 2022-06-01 | Discharge: 2022-06-01 | Disposition: A | Discharge status: Home / Self Care | Payer: 59 | Attending: Emergency Medicine | Admitting: Emergency Medicine

## 2022-06-01 ENCOUNTER — Encounter (HOSPITAL_COMMUNITY): Payer: Self-pay

## 2022-06-01 ENCOUNTER — Other Ambulatory Visit: Payer: Self-pay

## 2022-06-01 DIAGNOSIS — D5700 Hb-SS disease with crisis, unspecified: Secondary | ICD-10-CM | POA: Diagnosis present

## 2022-06-01 LAB — CBC WITH DIFFERENTIAL/PLATELET
Abs Immature Granulocytes: 0.05 10*3/uL (ref 0.00–0.07)
Basophils Absolute: 0 10*3/uL (ref 0.0–0.1)
Basophils Relative: 0 %
Eosinophils Absolute: 0 10*3/uL (ref 0.0–0.5)
Eosinophils Relative: 0 %
HCT: 32.4 % — ABNORMAL LOW (ref 39.0–52.0)
Hemoglobin: 11.7 g/dL — ABNORMAL LOW (ref 13.0–17.0)
Immature Granulocytes: 1 %
Lymphocytes Relative: 13 %
Lymphs Abs: 1.2 10*3/uL (ref 0.7–4.0)
MCH: 28.7 pg (ref 26.0–34.0)
MCHC: 36.1 g/dL — ABNORMAL HIGH (ref 30.0–36.0)
MCV: 79.4 fL — ABNORMAL LOW (ref 80.0–100.0)
Monocytes Absolute: 1.3 10*3/uL — ABNORMAL HIGH (ref 0.1–1.0)
Monocytes Relative: 14 %
Neutro Abs: 6.8 10*3/uL (ref 1.7–7.7)
Neutrophils Relative %: 72 %
Platelets: 107 10*3/uL — ABNORMAL LOW (ref 150–400)
RBC: 4.08 MIL/uL — ABNORMAL LOW (ref 4.22–5.81)
RDW: 14.6 % (ref 11.5–15.5)
WBC: 9.3 10*3/uL (ref 4.0–10.5)
nRBC: 0.2 % (ref 0.0–0.2)

## 2022-06-01 LAB — COMPREHENSIVE METABOLIC PANEL
ALT: 19 U/L (ref 0–44)
AST: 12 U/L — ABNORMAL LOW (ref 15–41)
Albumin: 4.2 g/dL (ref 3.5–5.0)
Alkaline Phosphatase: 47 U/L (ref 38–126)
Anion gap: 10 (ref 5–15)
BUN: 11 mg/dL (ref 6–20)
CO2: 26 mmol/L (ref 22–32)
Calcium: 9.2 mg/dL (ref 8.9–10.3)
Chloride: 97 mmol/L — ABNORMAL LOW (ref 98–111)
Creatinine, Ser: 0.93 mg/dL (ref 0.61–1.24)
GFR, Estimated: >60 mL/min (ref >60)
Glucose, Bld: 121 mg/dL — ABNORMAL HIGH (ref 70–99)
Potassium: 3.6 mmol/L (ref 3.5–5.1)
Sodium: 133 mmol/L — ABNORMAL LOW (ref 135–145)
Total Bilirubin: 2.7 mg/dL — ABNORMAL HIGH (ref 0.3–1.2)
Total Protein: 7.8 g/dL (ref 6.5–8.1)

## 2022-06-01 LAB — RETICULOCYTES
Immature Retic Fract: 11.3 % (ref 2.3–15.9)
RBC.: 4 MIL/uL — ABNORMAL LOW (ref 4.22–5.81)
Retic Count, Absolute: 72.8 10*3/uL (ref 19.0–186.0)
Retic Ct Pct: 1.8 % (ref 0.4–3.1)

## 2022-06-01 MED ORDER — KETOROLAC TROMETHAMINE 30 MG/ML IJ SOLN
30.0000 mg | Freq: Once | INTRAMUSCULAR | Status: AC
  Administered 2022-06-01: 30 mg via INTRAVENOUS
  Filled 2022-06-01: qty 1

## 2022-06-01 MED ORDER — HYDROMORPHONE HCL 1 MG/ML IJ SOLN
1.0000 mg | Freq: Once | INTRAMUSCULAR | Status: AC
  Administered 2022-06-01: 1 mg via INTRAVENOUS
  Filled 2022-06-01: qty 1

## 2022-06-01 MED ORDER — ONDANSETRON HCL 4 MG/2ML IJ SOLN
4.0000 mg | Freq: Once | INTRAMUSCULAR | Status: AC
  Administered 2022-06-01: 4 mg via INTRAVENOUS
  Filled 2022-06-01: qty 2

## 2022-06-01 MED ORDER — SODIUM CHLORIDE 0.9 % IV BOLUS
1000.0000 mL | Freq: Once | INTRAVENOUS | Status: AC
  Administered 2022-06-01: 1000 mL via INTRAVENOUS

## 2022-06-01 NOTE — Discharge Instructions (Addendum)
Evaluation of your pain today revealed that you likely did have a sickle cell pain crisis.  Sure that your pain improved after treatment.  Recommend that you follow-up with your PCP.  If you have new shortness of breath or chest pain, lightheaded or dizzy, please return to the emergency department for further evaluation.

## 2022-06-01 NOTE — ED Triage Notes (Signed)
Pt to er, pt states that he is currently in a sickle cell crisis, pt c/o leg pain and back pain.  Pt states that this normally happens when the weather changes, pt states that he has been having the pain for the past two days.  States that he has tried his pain med at home without relief.

## 2022-06-01 NOTE — ED Provider Notes (Signed)
[facility]-EMERGENCY DEPT Provider Note   CSN: [id number] Arrival date & time: 06/01/22  0865     History  Chief Complaint  Patient presents with   Back Pain   Sickle Cell Pain Crisis   HPI Austin [name] is a 31 y.o. male with sickle cell anemia presenting for back pain and  sickle cell pain crisis.  Started 2 days ago.  Pain is located in his lower back both legs, shins and calves.  Pain is constant.  Feels like a stabbing and aching.  Took 1 oxycodone last night but did not help.  He is able to walk but that it hurts.  Denies chest pain and shortness of breath.  Denies saddle anesthesia, urinary or bowel incontinence and trauma.   Back Pain Sickle Cell Pain Crisis      Home Medications Prior to Admission medications   Medication Sig Start Date End Date Taking? Authorizing Provider  cyclobenzaprine (FLEXERIL) 10 MG tablet Take 1 tablet (10 mg total) by mouth 2 (two) times daily as needed for muscle spasms. 04/20/22   Tomi Bamberger, PA-C  dicyclomine (BENTYL) 20 MG tablet TAKE 1 TABLET BY MOUTH TWICE A DAY 03/25/22   Quentin Angst, MD  loratadine (CLARITIN) 10 MG tablet Take 1 tablet (10 mg total) by mouth daily. Patient taking differently: Take 10 mg by mouth daily as needed for allergies or rhinitis. 11/13/20   Barbette Merino, NP  LORazepam (ATIVAN) 0.5 MG tablet Take 1 tablet (0.5 mg total) by mouth at bedtime. 03/25/22   Quentin Angst, MD  ondansetron (ZOFRAN-ODT) 4 MG disintegrating tablet Take 1 tablet (4 mg total) by mouth every 8 (eight) hours as needed for nausea or vomiting. 05/31/22   Massie Maroon, FNP  triamcinolone (NASACORT) 55 MCG/ACT AERO nasal inhaler Place 2 sprays into the nose daily. 2 sprays each nostril at night 04/27/21   Drema Halon, MD  Vitamin D, Ergocalciferol, (DRISDOL) 1.25 MG (50000 UNIT) CAPS capsule TAKE 1 CAPSULE (50,000 UNITS TOTAL) BY MOUTH ONCE A WEEK. X 12 WEEKS. 04/13/22   Ivonne Andrew, NP       Allergies    Patient has no known allergies.    Review of Systems   Review of Systems  Musculoskeletal:  Positive for back pain.    Physical Exam Updated Vital Signs BP 103/63   Pulse 92   Temp 98.5 F (36.9 C) (Oral)   Resp 16   Ht 5\' 7"  (1.702 m)   Wt 76.2 kg   SpO2 100%   BMI 26.31 kg/m  Physical Exam Vitals and nursing note reviewed.  HENT:     Head: Normocephalic and atraumatic.     Mouth/Throat:     Mouth: Mucous membranes are moist.  Eyes:     General:        Right eye: No discharge.        Left eye: No discharge.     Conjunctiva/sclera: Conjunctivae normal.  Cardiovascular:     Rate and Rhythm: Normal rate and regular rhythm.     Pulses: Normal pulses.          Radial pulses are 2+ on the right side and 2+ on the left side.       Dorsalis pedis pulses are 2+ on the right side and 2+ on the left side.     Heart sounds: Normal heart sounds.  Pulmonary:     Effort: Pulmonary effort is normal.  Breath sounds: Normal breath sounds.  Abdominal:     General: Abdomen is flat.     Palpations: Abdomen is soft.  Musculoskeletal:     Comments: Pain with active extension and flexion of the back. Otherwise normal ROM.   Bilateral calf tenderness. ROM, strength and sensation in tact in the lower extremities.   Skin:    General: Skin is warm and dry.  Neurological:     General: No focal deficit present.  Psychiatric:        Mood and Affect: Mood normal.     ED Results / Procedures / Treatments   Labs (all labs ordered are listed, but only abnormal results are displayed) Labs Reviewed  COMPREHENSIVE METABOLIC PANEL - Abnormal; Notable for the following components:      Result Value   Sodium 133 (*)    Chloride 97 (*)    Glucose, Bld 121 (*)    AST 12 (*)    Total Bilirubin 2.7 (*)    All other components within normal limits  CBC WITH DIFFERENTIAL/PLATELET - Abnormal; Notable for the following components:   RBC 4.08 (*)    Hemoglobin 11.7 (*)     HCT 32.4 (*)    MCV 79.4 (*)    MCHC 36.1 (*)    Platelets 107 (*)    Monocytes Absolute 1.3 (*)    All other components within normal limits  RETICULOCYTES - Abnormal; Notable for the following components:   RBC. 4.00 (*)    All other components within normal limits    EKG None  Radiology No results found.  Procedures Procedures    Medications Ordered in ED Medications  sodium chloride 0.9 % bolus 1,000 mL (1,000 mLs Intravenous New Bag/Given 06/01/22 0824)  ondansetron (ZOFRAN) injection 4 mg (4 mg Intravenous Given 06/01/22 6269)  HYDROmorphone (DILAUDID) injection 1 mg (1 mg Intravenous Given 06/01/22 0823)  ketorolac (TORADOL) 30 MG/ML injection 30 mg (30 mg Intravenous Given 06/01/22 0901)  HYDROmorphone (DILAUDID) injection 1 mg (1 mg Intravenous Given 06/01/22 1006)    ED Course/ Medical Decision Making/ A&P                           Medical Decision Making Amount and/or Complexity of Data Reviewed Labs: ordered.  Risk Prescription drug management.   Patient presented for sickle cell pain crisis.  Exam was overall reassuring did elicit lower back tenderness along with bilateral calf tenderness.  She stated that his symptoms are consistent with prior sickle cell pain crisis.  Treated pain with Dilaudid and Toradol.  Volume resuscitated with normal saline bolus.  Treated nausea prophylactically with Zofran.  Upon reevaluation patient stated he felt much better.  Given his calf tenderness to consider DVT and pulmonary embolism but unlikely given calf tenderness was bilateral, patient was nontachycardic, and denies shortness of breath and chest pain.  Also considered anemia but unlikely given reassuring CBC.  Advised patient to follow-up with his PCP. Discussed return precautions.         Final Clinical Impression(s) / ED Diagnoses Final diagnoses:  Sickle cell crisis [facility])    Rx / DC Orders ED Discharge Orders     None         Harriet Pho,  PA-C 06/01/22 1103    Blanchie Dessert, MD 06/02/22 0900

## 2022-06-18 ENCOUNTER — Encounter (HOSPITAL_COMMUNITY): Payer: Self-pay | Admitting: Oncology

## 2022-06-18 ENCOUNTER — Other Ambulatory Visit: Payer: Self-pay

## 2022-06-18 ENCOUNTER — Other Ambulatory Visit: Payer: Self-pay | Admitting: Internal Medicine

## 2022-06-18 ENCOUNTER — Emergency Department (HOSPITAL_COMMUNITY)
Admission: EM | Admit: 2022-06-18 | Discharge: 2022-06-18 | Disposition: A | Discharge status: Home / Self Care | Payer: 59 | Attending: Emergency Medicine | Admitting: Emergency Medicine

## 2022-06-18 DIAGNOSIS — D5700 Hb-SS disease with crisis, unspecified: Secondary | ICD-10-CM | POA: Diagnosis not present

## 2022-06-18 DIAGNOSIS — G894 Chronic pain syndrome: Secondary | ICD-10-CM

## 2022-06-18 DIAGNOSIS — D571 Sickle-cell disease without crisis: Secondary | ICD-10-CM

## 2022-06-18 DIAGNOSIS — M545 Low back pain, unspecified: Secondary | ICD-10-CM | POA: Diagnosis present

## 2022-06-18 LAB — CBC WITH DIFFERENTIAL/PLATELET
Abs Immature Granulocytes: 0.03 10*3/uL (ref 0.00–0.07)
Basophils Absolute: 0 10*3/uL (ref 0.0–0.1)
Basophils Relative: 0 %
Eosinophils Absolute: 0 10*3/uL (ref 0.0–0.5)
Eosinophils Relative: 0 %
HCT: 35.3 % — ABNORMAL LOW (ref 39.0–52.0)
Hemoglobin: 12.5 g/dL — ABNORMAL LOW (ref 13.0–17.0)
Immature Granulocytes: 0 %
Lymphocytes Relative: 7 %
Lymphs Abs: 0.8 10*3/uL (ref 0.7–4.0)
MCH: 27.2 pg (ref 26.0–34.0)
MCHC: 35.4 g/dL (ref 30.0–36.0)
MCV: 76.7 fL — ABNORMAL LOW (ref 80.0–100.0)
Monocytes Absolute: 1 10*3/uL (ref 0.1–1.0)
Monocytes Relative: 9 %
Neutro Abs: 9.5 10*3/uL — ABNORMAL HIGH (ref 1.7–7.7)
Neutrophils Relative %: 84 %
Platelets: 130 10*3/uL — ABNORMAL LOW (ref 150–400)
RBC: 4.6 MIL/uL (ref 4.22–5.81)
RDW: 15.4 % (ref 11.5–15.5)
WBC: 11.3 10*3/uL — ABNORMAL HIGH (ref 4.0–10.5)
nRBC: 0 % (ref 0.0–0.2)

## 2022-06-18 LAB — RETICULOCYTES
Immature Retic Fract: 19.8 % — ABNORMAL HIGH (ref 2.3–15.9)
RBC.: 4.53 MIL/uL (ref 4.22–5.81)
Retic Count, Absolute: 131.8 10*3/uL (ref 19.0–186.0)
Retic Ct Pct: 2.9 % (ref 0.4–3.1)

## 2022-06-18 LAB — COMPREHENSIVE METABOLIC PANEL
ALT: 19 U/L (ref 0–44)
AST: 11 U/L — ABNORMAL LOW (ref 15–41)
Albumin: 4.5 g/dL (ref 3.5–5.0)
Alkaline Phosphatase: 51 U/L (ref 38–126)
Anion gap: 5 (ref 5–15)
BUN: 11 mg/dL (ref 6–20)
CO2: 27 mmol/L (ref 22–32)
Calcium: 9.3 mg/dL (ref 8.9–10.3)
Chloride: 102 mmol/L (ref 98–111)
Creatinine, Ser: 1 mg/dL (ref 0.61–1.24)
GFR, Estimated: >60 mL/min (ref >60)
Glucose, Bld: 120 mg/dL — ABNORMAL HIGH (ref 70–99)
Potassium: 4.3 mmol/L (ref 3.5–5.1)
Sodium: 134 mmol/L — ABNORMAL LOW (ref 135–145)
Total Bilirubin: 1.9 mg/dL — ABNORMAL HIGH (ref 0.3–1.2)
Total Protein: 7.9 g/dL (ref 6.5–8.1)

## 2022-06-18 MED ORDER — HYDROMORPHONE HCL 1 MG/ML IJ SOLN
1.0000 mg | INTRAMUSCULAR | Status: AC
  Administered 2022-06-18: 1 mg via INTRAVENOUS
  Filled 2022-06-18: qty 1

## 2022-06-18 MED ORDER — KETOROLAC TROMETHAMINE 15 MG/ML IJ SOLN
15.0000 mg | INTRAMUSCULAR | Status: AC
  Administered 2022-06-18: 15 mg via INTRAVENOUS
  Filled 2022-06-18: qty 1

## 2022-06-18 MED ORDER — OXYCODONE-ACETAMINOPHEN 10-300 MG PO TABS: 1.0000 | ORAL_TABLET | ORAL | 0 refills | Status: DC | PRN

## 2022-06-18 MED ORDER — SODIUM CHLORIDE 0.9 % IV BOLUS
500.0000 mL | Freq: Once | INTRAVENOUS | Status: AC
  Administered 2022-06-18: 500 mL via INTRAVENOUS

## 2022-06-18 MED ORDER — ONDANSETRON HCL 4 MG/2ML IJ SOLN
4.0000 mg | INTRAMUSCULAR | Status: DC | PRN
  Administered 2022-06-18: 4 mg via INTRAVENOUS
  Filled 2022-06-18: qty 2

## 2022-06-18 MED ORDER — OXYCODONE HCL 5 MG PO TABS
15.0000 mg | ORAL_TABLET | Freq: Once | ORAL | Status: AC
  Administered 2022-06-18: 15 mg via ORAL
  Filled 2022-06-18: qty 3

## 2022-06-18 MED ORDER — DIPHENHYDRAMINE HCL 25 MG PO CAPS
25.0000 mg | ORAL_CAPSULE | ORAL | Status: DC | PRN
  Administered 2022-06-18: 50 mg via ORAL
  Filled 2022-06-18: qty 2

## 2022-06-18 MED ORDER — SODIUM CHLORIDE 0.45 % IV SOLN: INTRAVENOUS | Status: DC

## 2022-06-18 NOTE — ED Provider Notes (Signed)
[name] Provider Note   CSN: [id number] Arrival date & time: 06/18/22  [id number]     History  Chief Complaint  Patient presents with   Sickle Cell Pain Crisis    Austin [name] is a 31 y.o. male.  HPI    31 year old male with history of sickle cell anemia comes with chief complaint of pain in his legs, lower back.  Patient states that the current pain started at 2 AM.  He does not have any pain medicine to take.  Pain is similar to his sickle cell pain.  He is unsure of the trigger.  Patient states that his sickle cell is managed at the sickle cell clinic, but he has not been able to get his medications ordered.  Review of system is negative for any fevers, chills, cough, positive for nausea and emesis x 2.  Without abdominal pain.  No recent admission to the hospital for sickle cell.  Home Medications Prior to Admission medications   Medication Sig Start Date End Date Taking? Authorizing Provider  cyclobenzaprine (FLEXERIL) 10 MG tablet Take 1 tablet (10 mg total) by mouth 2 (two) times daily as needed for muscle spasms. 04/20/22   Francene Finders, PA-C  dicyclomine (BENTYL) 20 MG tablet TAKE 1 TABLET BY MOUTH TWICE A DAY 03/25/22   Tresa Garter, MD  loratadine (CLARITIN) 10 MG tablet Take 1 tablet (10 mg total) by mouth daily. Patient taking differently: Take 10 mg by mouth daily as needed for allergies or rhinitis. 11/13/20   Vevelyn Francois, NP  LORazepam (ATIVAN) 0.5 MG tablet Take 1 tablet (0.5 mg total) by mouth at bedtime. 03/25/22   Tresa Garter, MD  ondansetron (ZOFRAN-ODT) 4 MG disintegrating tablet Take 1 tablet (4 mg total) by mouth every 8 (eight) hours as needed for nausea or vomiting. 05/31/22   Dorena Dew, FNP  triamcinolone (NASACORT) 55 MCG/ACT AERO nasal inhaler Place 2 sprays into the nose daily. 2 sprays each nostril at night 04/27/21   Rozetta Nunnery, MD  Vitamin D, Ergocalciferol, (DRISDOL) 1.25 MG  (50000 UNIT) CAPS capsule TAKE 1 CAPSULE (50,000 UNITS TOTAL) BY MOUTH ONCE A WEEK. X 12 WEEKS. 04/13/22   Fenton Foy, NP      Allergies    Patient has no known allergies.    Review of Systems   Review of Systems  All other systems reviewed and are negative.   Physical Exam Updated Vital Signs BP 116/67   Pulse 83   Temp 97.8 F (36.6 C) (Oral)   Resp 17   Ht 5\' 7"  (1.702 m)   Wt 75.3 kg   SpO2 98%   BMI 26.00 kg/m  Physical Exam Vitals and nursing note reviewed.  Constitutional:      General: He is in acute distress.     Appearance: He is well-developed.     Comments: Patient appears distressed due to pain  HENT:     Head: Atraumatic.  Eyes:     Extraocular Movements: Extraocular movements intact.     Pupils: Pupils are equal, round, and reactive to light.  Cardiovascular:     Rate and Rhythm: Normal rate.  Pulmonary:     Effort: Pulmonary effort is normal.  Abdominal:     Tenderness: There is no abdominal tenderness.  Musculoskeletal:     Cervical back: Neck supple.  Skin:    General: Skin is warm.  Neurological:     Mental Status: He  is alert and oriented to person, place, and time.     ED Results / Procedures / Treatments   Labs (all labs ordered are listed, but only abnormal results are displayed) Labs Reviewed  CBC WITH DIFFERENTIAL/PLATELET - Abnormal; Notable for the following components:      Result Value   WBC 11.3 (*)    Hemoglobin 12.5 (*)    HCT 35.3 (*)    MCV 76.7 (*)    Platelets 130 (*)    Neutro Abs 9.5 (*)    All other components within normal limits  COMPREHENSIVE METABOLIC PANEL - Abnormal; Notable for the following components:   Sodium 134 (*)    Glucose, Bld 120 (*)    AST 11 (*)    Total Bilirubin 1.9 (*)    All other components within normal limits  RETICULOCYTES - Abnormal; Notable for the following components:   Immature Retic Fract 19.8 (*)    All other components within normal limits     EKG None  Radiology No results found.  Procedures Procedures    Medications Ordered in ED Medications  ondansetron (ZOFRAN) injection 4 mg (4 mg Intravenous Given 06/18/22 0758)  diphenhydrAMINE (BENADRYL) capsule 25-50 mg (50 mg Oral Given 06/18/22 0758)  HYDROmorphone (DILAUDID) injection 1 mg (1 mg Intravenous Given 06/18/22 0758)  HYDROmorphone (DILAUDID) injection 1 mg (1 mg Intravenous Given 06/18/22 0839)  HYDROmorphone (DILAUDID) injection 1 mg (1 mg Intravenous Given 06/18/22 0914)  sodium chloride 0.9 % bolus 500 mL (0 mLs Intravenous Stopped 06/18/22 0839)  ketorolac (TORADOL) 15 MG/ML injection 15 mg (15 mg Intravenous Given 06/18/22 1008)  oxyCODONE (Oxy IR/ROXICODONE) immediate release tablet 15 mg (15 mg Oral Given 06/18/22 1006)    ED Course/ Medical Decision Making/ A&P Clinical Course as of 06/18/22 1024  Fri Jun 18, 2022  1024 Patient feeling better.  Oral medication given.  I consulted with sickle cell team, Dr. Doreene Burke.  Dr. Doreene Burke will send his prescriptions to his pharmacy.  Patient made aware of this action.  Stable for discharge. [AN]    Clinical Course User Index [AN] Varney Biles, MD                           Medical Decision Making Amount and/or Complexity of Data Reviewed Labs: ordered.  Risk Prescription drug management.   Pt comes in with sickle cell related pain.  Patient has history of sickle cell anemia with complications from it including vaso-occlusive crisis in the past. We have reviewed patient's previous ED visits, recent lab results and recent imaging results for this encounter.  Differential diagnosis includes vaso-occlusive pain, occult infection, dehydration, electrolyte abnormality, hyperbilirubinemia, aplastic anemia.  VSS and WNL -  hemodynamically stable.  History and exam is not indicative of any specific source of infection at this time.  Pain appears vaso-occlusive type and typical of previous pain. Appropriate  labs ordered.  Pain control and symptom management initiated while in the ED with parenteral medication. We will reassess patient after multiple doses of analgesics. Goal is to break the pain and see if patient feels comfortable going home.  Currently, there is no signs of severe decompensation clinically. Will continue to monitor closely.  Admission has been considered for this patient, and we will proceed with this request If we are unable to control the pain, we will admit the patient.   Final Clinical Impression(s) / ED Diagnoses Final diagnoses:  Sickle cell pain crisis ([place])  Rx / DC Orders ED Discharge Orders     None         Derwood Kaplan, MD 06/18/22 1024

## 2022-06-18 NOTE — Discharge Instructions (Addendum)
You are seen in the ER for sickle cell related pain.  Pain has improved while in the emergency room with parenteral medications.  We discussed your case with Dr. Hyman Hopes, he will send your prescription to your pharmacy today.  Please call the clinic for routine follow-up.

## 2022-06-18 NOTE — ED Triage Notes (Signed)
Pt bib [facility] d/t [facility] to legs and lower back.

## 2022-07-05 ENCOUNTER — Encounter: Payer: Self-pay | Admitting: Nurse Practitioner

## 2022-07-05 ENCOUNTER — Other Ambulatory Visit: Payer: Self-pay | Admitting: *Deleted

## 2022-07-05 ENCOUNTER — Ambulatory Visit (INDEPENDENT_AMBULATORY_CARE_PROVIDER_SITE_OTHER): Payer: 59 | Admitting: Nurse Practitioner

## 2022-07-05 VITALS — BP 128/84 | HR 101 | Ht 67.0 in | Wt 165.0 lb

## 2022-07-05 DIAGNOSIS — N529 Male erectile dysfunction, unspecified: Secondary | ICD-10-CM | POA: Diagnosis not present

## 2022-07-05 DIAGNOSIS — F419 Anxiety disorder, unspecified: Secondary | ICD-10-CM | POA: Diagnosis not present

## 2022-07-05 DIAGNOSIS — D571 Sickle-cell disease without crisis: Secondary | ICD-10-CM

## 2022-07-05 DIAGNOSIS — E559 Vitamin D deficiency, unspecified: Secondary | ICD-10-CM | POA: Diagnosis not present

## 2022-07-05 MED ORDER — OXYCODONE HCL 10 MG PO TABS: 10.0000 mg | ORAL_TABLET | Freq: Four times a day (QID) | ORAL | 0 refills | Status: DC | PRN

## 2022-07-05 MED ORDER — LORAZEPAM 0.5 MG PO TABS: 0.5000 mg | ORAL_TABLET | Freq: Every day | ORAL | 0 refills | Status: DC

## 2022-07-05 NOTE — Assessment & Plan Note (Signed)
-   Sickle Cell Panel - LORazepam (ATIVAN) 0.5 MG tablet; Take 1 tablet (0.5 mg total) by mouth at bedtime.  Dispense: 15 tablet; Refill: 0 - Oxycodone HCl 10 MG TABS; Take 1 tablet (10 mg total) by mouth every 6 (six) hours as needed.  Dispense: 30 tablet; Refill: 0  2. Vitamin D deficiency  - Sickle Cell Panel  3. Erectile dysfunction, unspecified erectile dysfunction type  - Testosterone  4. Anxiety  - LORazepam (ATIVAN) 0.5 MG tablet; Take 1 tablet (0.5 mg total) by mouth at bedtime.  Dispense: 15 tablet; Refill: 0  Follow up:  Follow up in 3 months or sooner

## 2022-07-05 NOTE — Telephone Encounter (Signed)
Pt requesting refills send to [facility]- particular pain meds is not on the list. Please advise

## 2022-07-05 NOTE — Progress Notes (Signed)
@Patient  ID: , male    DOB: 1991-01-08, 31 y.o.   MRN: [id number]  Chief Complaint  Patient presents with   Follow-up    Follow-up for sickle cell    Referring provider: [id number], MD   HPI  31 year old male with history of sickle cell anemia.   Patient presents today for a follow-up visit.  This is a patient of Dr. 34.  He has had several ED visits for sickle cell crisis over the past few months.  Patient is currently taking Ativan for anxiety and states that he usually takes oxycodone HCL 10 mg for sickle cell pain. Denies f/c/s, n/v/d, hemoptysis, PND, leg swelling Denies chest pain or edema     No Known Allergies  Immunization History  Administered Date(s) Administered   Influenza,inj,Quad PF,6+ Mos 03/13/2018   Influenza-Unspecified 04/25/2020   PFIZER(Purple Top)SARS-COV-2 Vaccination 04/04/2020, 04/25/2020   Tdap 08/27/2015    Past Medical History:  Diagnosis Date   Acute maxillary sinusitis    Eczema    Sickle cell anemia (HCC) .    Tobacco History: Social History   Tobacco Use  Smoking Status Former   Packs/day: 0.10   Types: Cigarettes   Quit date: 11/23/2013   Years since quitting: 8.6  Smokeless Tobacco Never   Counseling given: Not Answered   Outpatient Encounter Medications as of 07/05/2022  Medication Sig   dicyclomine (BENTYL) 20 MG tablet TAKE 1 TABLET BY MOUTH TWICE A DAY   loratadine (CLARITIN) 10 MG tablet Take 1 tablet (10 mg total) by mouth daily. (Patient taking differently: Take 10 mg by mouth daily as needed for allergies or rhinitis.)   ondansetron (ZOFRAN-ODT) 4 MG disintegrating tablet Take 1 tablet (4 mg total) by mouth every 8 (eight) hours as needed for nausea or vomiting.   Oxycodone HCl 10 MG TABS Take 1 tablet (10 mg total) by mouth every 6 (six) hours as needed.   Vitamin D, Ergocalciferol, (DRISDOL) 1.25 MG (50000 UNIT) CAPS capsule TAKE 1 CAPSULE (50,000 UNITS TOTAL) BY MOUTH ONCE A WEEK.  X 12 WEEKS.   [DISCONTINUED] LORazepam (ATIVAN) 0.5 MG tablet Take 1 tablet (0.5 mg total) by mouth at bedtime.   LORazepam (ATIVAN) 0.5 MG tablet Take 1 tablet (0.5 mg total) by mouth at bedtime.   triamcinolone (NASACORT) 55 MCG/ACT AERO nasal inhaler Place 2 sprays into the nose daily. 2 sprays each nostril at night (Patient not taking: Reported on 07/05/2022)   [DISCONTINUED] cyclobenzaprine (FLEXERIL) 10 MG tablet Take 1 tablet (10 mg total) by mouth 2 (two) times daily as needed for muscle spasms. (Patient not taking: Reported on 07/05/2022)   [DISCONTINUED] oxycodone-acetaminophen (LYNOX) 10-300 MG tablet Take 1 tablet by mouth every 4 (four) hours as needed for pain. (Patient not taking: Reported on 07/05/2022)   No facility-administered encounter medications on file as of 07/05/2022.     Review of Systems  Review of Systems  Constitutional: Negative.   HENT: Negative.    Cardiovascular: Negative.   Gastrointestinal: Negative.   Allergic/Immunologic: Negative.   Neurological: Negative.   Psychiatric/Behavioral: Negative.         Physical Exam  BP 128/84   Pulse (!) 101   Ht 5\' 7"  (1.702 m)   Wt 165 lb (74.8 kg)   SpO2 100%   BMI 25.84 kg/m   Wt Readings from Last 5 Encounters:  07/05/22 165 lb (74.8 kg)  06/18/22 166 lb (75.3 kg)  06/01/22 168 lb (76.2 kg)  03/25/22 181  lb (82.1 kg)  02/04/22 185 lb (83.9 kg)     Physical Exam Vitals and nursing note reviewed.  Constitutional:      General: He is not in acute distress.    Appearance: He is well-developed.  Cardiovascular:     Rate and Rhythm: Normal rate and regular rhythm.  Pulmonary:     Effort: Pulmonary effort is normal.     Breath sounds: Normal breath sounds.  Skin:    General: Skin is warm and dry.  Neurological:     Mental Status: He is alert and oriented to person, place, and time.      Lab Results:  CBC    Component Value Date/Time   WBC 11.3 (H) 06/18/2022 0800   RBC 4.60  06/18/2022 0800   RBC 4.53 06/18/2022 0800   HGB 12.5 (L) 06/18/2022 0800   HGB 17.1 03/25/2022 1000   HCT 35.3 (L) 06/18/2022 0800   HCT 48.6 03/25/2022 1000   PLT 130 (L) 06/18/2022 0800   PLT 219 03/25/2022 1000   MCV 76.7 (L) 06/18/2022 0800   MCV 88 03/25/2022 1000   MCH 27.2 06/18/2022 0800   MCHC 35.4 06/18/2022 0800   RDW 15.4 06/18/2022 0800   RDW 11.5 (L) 03/25/2022 1000   LYMPHSABS 0.8 06/18/2022 0800   LYMPHSABS 1.2 03/25/2022 1000   MONOABS 1.0 06/18/2022 0800   EOSABS 0.0 06/18/2022 0800   EOSABS 0.3 03/25/2022 1000   BASOSABS 0.0 06/18/2022 0800   BASOSABS 0.1 03/25/2022 1000    BMET    Component Value Date/Time   NA 134 (L) 06/18/2022 0800   NA 138 03/25/2022 1000   K 4.3 06/18/2022 0800   CL 102 06/18/2022 0800   CO2 27 06/18/2022 0800   GLUCOSE 120 (H) 06/18/2022 0800   BUN 11 06/18/2022 0800   BUN 7 03/25/2022 1000   CREATININE 1.00 06/18/2022 0800   CALCIUM 9.3 06/18/2022 0800   GFRNONAA >60 06/18/2022 0800   GFRAA >60 02/05/2020 0925     Assessment & Plan:   Sickle cell anemia without crisis (HCC) - Sickle Cell Panel - LORazepam (ATIVAN) 0.5 MG tablet; Take 1 tablet (0.5 mg total) by mouth at bedtime.  Dispense: 15 tablet; Refill: 0 - Oxycodone HCl 10 MG TABS; Take 1 tablet (10 mg total) by mouth every 6 (six) hours as needed.  Dispense: 30 tablet; Refill: 0  2. Vitamin D deficiency  - Sickle Cell Panel  3. Erectile dysfunction, unspecified erectile dysfunction type  - Testosterone  4. Anxiety  - LORazepam (ATIVAN) 0.5 MG tablet; Take 1 tablet (0.5 mg total) by mouth at bedtime.  Dispense: 15 tablet; Refill: 0  Follow up:  Follow up in 3 months or sooner     Ivonne Andrew, NP 07/05/2022

## 2022-07-05 NOTE — Patient Instructions (Addendum)
1. Sickle cell anemia without crisis (HCC)  - Sickle Cell Panel - LORazepam (ATIVAN) 0.5 MG tablet; Take 1 tablet (0.5 mg total) by mouth at bedtime.  Dispense: 15 tablet; Refill: 0 - Oxycodone HCl 10 MG TABS; Take 1 tablet (10 mg total) by mouth every 6 (six) hours as needed.  Dispense: 30 tablet; Refill: 0  2. Vitamin D deficiency  - Sickle Cell Panel  3. Erectile dysfunction, unspecified erectile dysfunction type  - Testosterone  4. Anxiety  - LORazepam (ATIVAN) 0.5 MG tablet; Take 1 tablet (0.5 mg total) by mouth at bedtime.  Dispense: 15 tablet; Refill: 0  Follow up:  Follow up in 3 months or sooner

## 2022-07-06 LAB — CMP14+CBC/D/PLT+FER+RETIC+V...
ALT: 18 IU/L (ref 0–44)
AST: 9 IU/L (ref 0–40)
Albumin/Globulin Ratio: 1.5 (ref 1.2–2.2)
Albumin: 4.6 g/dL (ref 4.1–5.1)
Alkaline Phosphatase: 67 IU/L (ref 44–121)
BUN/Creatinine Ratio: 9 (ref 9–20)
BUN: 9 mg/dL (ref 6–20)
Basophils Absolute: 0 10*3/uL (ref 0.0–0.2)
Basos: 0 %
Bilirubin Total: 1.3 mg/dL — ABNORMAL HIGH (ref 0.0–1.2)
CO2: 22 mmol/L (ref 20–29)
Calcium: 9.6 mg/dL (ref 8.7–10.2)
Chloride: 104 mmol/L (ref 96–106)
Creatinine, Ser: 1 mg/dL (ref 0.76–1.27)
EOS (ABSOLUTE): 0.1 10*3/uL (ref 0.0–0.4)
Eos: 1 %
Ferritin: 601 ng/mL — ABNORMAL HIGH (ref 30–400)
Globulin, Total: 3 g/dL (ref 1.5–4.5)
Glucose: 94 mg/dL (ref 70–99)
Hematocrit: 39.2 % (ref 37.5–51.0)
Hemoglobin: 13 g/dL (ref 13.0–17.7)
Immature Grans (Abs): 0 10*3/uL (ref 0.0–0.1)
Immature Granulocytes: 0 %
Lymphocytes Absolute: 1.9 10*3/uL (ref 0.7–3.1)
Lymphs: 22 %
MCH: 27.1 pg (ref 26.6–33.0)
MCHC: 33.2 g/dL (ref 31.5–35.7)
MCV: 82 fL (ref 79–97)
Monocytes Absolute: 0.7 10*3/uL (ref 0.1–0.9)
Monocytes: 8 %
Neutrophils Absolute: 6.1 10*3/uL (ref 1.4–7.0)
Neutrophils: 69 %
Platelets: 148 10*3/uL — ABNORMAL LOW (ref 150–450)
Potassium: 4.1 mmol/L (ref 3.5–5.2)
RBC: 4.79 x10E6/uL (ref 4.14–5.80)
RDW: 15.8 % — ABNORMAL HIGH (ref 11.6–15.4)
Retic Ct Pct: 2.8 % — ABNORMAL HIGH (ref 0.6–2.6)
Sodium: 141 mmol/L (ref 134–144)
Total Protein: 7.6 g/dL (ref 6.0–8.5)
Vit D, 25-Hydroxy: 44.1 ng/mL (ref 30.0–100.0)
WBC: 8.9 10*3/uL (ref 3.4–10.8)
eGFR: 103 mL/min/{1.73_m2} (ref >59)

## 2022-07-06 LAB — TESTOSTERONE: Testosterone: 436 ng/dL (ref 264–916)

## 2022-07-15 ENCOUNTER — Other Ambulatory Visit: Payer: Self-pay

## 2022-07-15 DIAGNOSIS — D571 Sickle-cell disease without crisis: Secondary | ICD-10-CM

## 2022-07-15 NOTE — Telephone Encounter (Signed)
Please advise KH 

## 2022-07-15 NOTE — Telephone Encounter (Signed)
From: [name] To: Office of Ivonne Andrew, NP Sent: 07/14/2022 2:04 PM EST Subject: Medication Renewal Request  Refills have been requested for the following medications:   Oxycodone HCl 10 MG TABS [name]]  Preferred pharmacy: [id number] - [place], [place] - [address] Delivery method: [employer]

## 2022-07-16 ENCOUNTER — Other Ambulatory Visit: Payer: Self-pay | Admitting: Family Medicine

## 2022-07-16 DIAGNOSIS — D571 Sickle-cell disease without crisis: Secondary | ICD-10-CM

## 2022-07-16 MED ORDER — OXYCODONE HCL 10 MG PO TABS: 10.0000 mg | ORAL_TABLET | Freq: Four times a day (QID) | ORAL | 0 refills | Status: DC | PRN

## 2022-07-16 NOTE — Progress Notes (Signed)
Reviewed PDMP substance reporting system prior to prescribing opiate medications. No inconsistencies noted.  Meds ordered this encounter  Medications   Oxycodone HCl 10 MG TABS    Sig: Take 1 tablet (10 mg total) by mouth every 6 (six) hours as needed.    Dispense:  60 tablet    Refill:  0    Order Specific Question:   Supervising Provider    Answer:   JEGEDE, OLUGBEMIGA E [[id number]]   Austin Bruemmer Moore Shakeitha Umbaugh  APRN, MSN, FNP-C [facility] [address]  [place], [place] [address] [phone]  

## 2022-08-11 ENCOUNTER — Emergency Department (HOSPITAL_COMMUNITY)
Admission: EM | Admit: 2022-08-11 | Discharge: 2022-08-12 | Disposition: A | Discharge status: Home / Self Care | Payer: 59 | Attending: Emergency Medicine | Admitting: Emergency Medicine

## 2022-08-11 ENCOUNTER — Encounter (HOSPITAL_COMMUNITY): Payer: Self-pay

## 2022-08-11 ENCOUNTER — Other Ambulatory Visit: Payer: Self-pay

## 2022-08-11 DIAGNOSIS — F1120 Opioid dependence, uncomplicated: Secondary | ICD-10-CM | POA: Diagnosis not present

## 2022-08-11 DIAGNOSIS — Z79899 Other long term (current) drug therapy: Secondary | ICD-10-CM | POA: Insufficient documentation

## 2022-08-11 DIAGNOSIS — D5700 Hb-SS disease with crisis, unspecified: Secondary | ICD-10-CM | POA: Insufficient documentation

## 2022-08-11 DIAGNOSIS — G894 Chronic pain syndrome: Secondary | ICD-10-CM | POA: Diagnosis not present

## 2022-08-11 DIAGNOSIS — D638 Anemia in other chronic diseases classified elsewhere: Secondary | ICD-10-CM | POA: Insufficient documentation

## 2022-08-11 DIAGNOSIS — Z87891 Personal history of nicotine dependence: Secondary | ICD-10-CM | POA: Insufficient documentation

## 2022-08-11 DIAGNOSIS — F1290 Cannabis use, unspecified, uncomplicated: Secondary | ICD-10-CM | POA: Insufficient documentation

## 2022-08-11 LAB — RETICULOCYTES
Immature Retic Fract: 23.9 % — ABNORMAL HIGH (ref 2.3–15.9)
RBC.: 4.87 MIL/uL (ref 4.22–5.81)
Retic Count, Absolute: 170.9 10*3/uL (ref 19.0–186.0)
Retic Ct Pct: 3.5 % — ABNORMAL HIGH (ref 0.4–3.1)

## 2022-08-11 LAB — COMPREHENSIVE METABOLIC PANEL
ALT: 22 U/L (ref 0–44)
AST: 12 U/L — ABNORMAL LOW (ref 15–41)
Albumin: 4.6 g/dL (ref 3.5–5.0)
Alkaline Phosphatase: 55 U/L (ref 38–126)
Anion gap: 9 (ref 5–15)
BUN: 9 mg/dL (ref 6–20)
CO2: 26 mmol/L (ref 22–32)
Calcium: 9.4 mg/dL (ref 8.9–10.3)
Chloride: 100 mmol/L (ref 98–111)
Creatinine, Ser: 0.95 mg/dL (ref 0.61–1.24)
GFR, Estimated: >60 mL/min (ref >60)
Glucose, Bld: 107 mg/dL — ABNORMAL HIGH (ref 70–99)
Potassium: 3.6 mmol/L (ref 3.5–5.1)
Sodium: 135 mmol/L (ref 135–145)
Total Bilirubin: 1.6 mg/dL — ABNORMAL HIGH (ref 0.3–1.2)
Total Protein: 8.3 g/dL — ABNORMAL HIGH (ref 6.5–8.1)

## 2022-08-11 LAB — CBC WITH DIFFERENTIAL/PLATELET
Abs Immature Granulocytes: 0.04 10*3/uL (ref 0.00–0.07)
Basophils Absolute: 0 10*3/uL (ref 0.0–0.1)
Basophils Relative: 0 %
Eosinophils Absolute: 0.1 10*3/uL (ref 0.0–0.5)
Eosinophils Relative: 1 %
HCT: 37 % — ABNORMAL LOW (ref 39.0–52.0)
Hemoglobin: 13.4 g/dL (ref 13.0–17.0)
Immature Granulocytes: 0 %
Lymphocytes Relative: 29 %
Lymphs Abs: 3.5 10*3/uL (ref 0.7–4.0)
MCH: 27.7 pg (ref 26.0–34.0)
MCHC: 36.2 g/dL — ABNORMAL HIGH (ref 30.0–36.0)
MCV: 76.4 fL — ABNORMAL LOW (ref 80.0–100.0)
Monocytes Absolute: 1 10*3/uL (ref 0.1–1.0)
Monocytes Relative: 8 %
Neutro Abs: 7.2 10*3/uL (ref 1.7–7.7)
Neutrophils Relative %: 62 %
Platelets: 133 10*3/uL — ABNORMAL LOW (ref 150–400)
RBC: 4.84 MIL/uL (ref 4.22–5.81)
RDW: 14.6 % (ref 11.5–15.5)
WBC: 11.9 10*3/uL — ABNORMAL HIGH (ref 4.0–10.5)
nRBC: 0.3 % — ABNORMAL HIGH (ref 0.0–0.2)

## 2022-08-11 NOTE — ED Triage Notes (Signed)
Reports SCC pain beginning yesterday. Pain worst in lower back and knees.   Has been taking oxycodone but ran out.

## 2022-08-11 NOTE — Telephone Encounter (Signed)
Caller & Relationship to patient:  MRN #  [id number]   Call Back Number:   Date of Last Office Visit: 07/15/2022     Date of Next Office Visit: 10/04/2022    Medication(s) to be Refilled: Oxycodone   Preferred Pharmacy:   ** Please notify patient to allow 48-72 hours to process** **Let patient know to contact pharmacy at the end of the day to make sure medication is ready. ** **If patient has not been seen in a year or longer, book an appointment **Advise to use MyChart for refill requests OR to contact their pharmacy

## 2022-08-11 NOTE — ED Provider Triage Note (Signed)
Emergency Medicine Provider Triage Evaluation Note  Austin [name] , a 32 y.o. male  was evaluated in triage.  Pt complains of sickle cell pain crisis.  Patient reports that he is having pain in his lower back and bilateral knees.  Patient denies any chest pain, shortness of breath, abdominal pain, nausea, vomiting.  Patient typically takes oxycodone at home and feels that this is not currently managing his pain well.  Review of Systems  Positive: As above Negative: As above  Physical Exam  BP 122/83 (BP Location: Left Arm)   Pulse 85   Temp 98.2 F (36.8 C)   Resp 18   SpO2 99%  Gen:   Awake, no distress   Resp:  Normal effort clear to auscultation bilaterally MSK:   Moves extremities without difficulty Other:    Medical Decision Making  Medically screening exam initiated at 10:57 PM.  Appropriate orders placed.  Karry Causer Swaby was informed that the remainder of the evaluation will be completed by another provider, this initial triage assessment does not replace that evaluation, and the importance of remaining in the ED until their evaluation is complete.     Luvenia Heller, PA-C 08/11/22 2258

## 2022-08-12 ENCOUNTER — Non-Acute Institutional Stay (HOSPITAL_BASED_OUTPATIENT_CLINIC_OR_DEPARTMENT_OTHER)
Admission: AD | Admit: 2022-08-12 | Discharge: 2022-08-12 | Disposition: A | Discharge status: Home / Self Care | Payer: 59 | Source: Ambulatory Visit | Attending: Internal Medicine | Admitting: Internal Medicine

## 2022-08-12 ENCOUNTER — Other Ambulatory Visit (HOSPITAL_COMMUNITY): Payer: Self-pay

## 2022-08-12 DIAGNOSIS — Z87891 Personal history of nicotine dependence: Secondary | ICD-10-CM | POA: Insufficient documentation

## 2022-08-12 DIAGNOSIS — D5700 Hb-SS disease with crisis, unspecified: Secondary | ICD-10-CM

## 2022-08-12 DIAGNOSIS — D571 Sickle-cell disease without crisis: Secondary | ICD-10-CM

## 2022-08-12 DIAGNOSIS — D638 Anemia in other chronic diseases classified elsewhere: Secondary | ICD-10-CM | POA: Insufficient documentation

## 2022-08-12 DIAGNOSIS — F1120 Opioid dependence, uncomplicated: Secondary | ICD-10-CM | POA: Insufficient documentation

## 2022-08-12 DIAGNOSIS — F1290 Cannabis use, unspecified, uncomplicated: Secondary | ICD-10-CM | POA: Insufficient documentation

## 2022-08-12 DIAGNOSIS — Z79899 Other long term (current) drug therapy: Secondary | ICD-10-CM | POA: Insufficient documentation

## 2022-08-12 DIAGNOSIS — G894 Chronic pain syndrome: Secondary | ICD-10-CM | POA: Insufficient documentation

## 2022-08-12 MED ORDER — OXYCODONE HCL 10 MG PO TABS
10.0000 mg | ORAL_TABLET | Freq: Four times a day (QID) | ORAL | 0 refills | Status: DC | PRN
  Filled 2022-08-12 – 2022-08-24 (×4): qty 60, 15d supply, fill #0

## 2022-08-12 MED ORDER — NALOXONE HCL 0.4 MG/ML IJ SOLN: 0.4000 mg | INTRAMUSCULAR | Status: DC | PRN

## 2022-08-12 MED ORDER — KETOROLAC TROMETHAMINE 30 MG/ML IJ SOLN
15.0000 mg | Freq: Once | INTRAMUSCULAR | Status: AC
  Administered 2022-08-12: 15 mg via INTRAVENOUS
  Filled 2022-08-12: qty 1

## 2022-08-12 MED ORDER — FOLIC ACID 1 MG PO TABS
1.0000 mg | ORAL_TABLET | Freq: Every day | ORAL | 3 refills | Status: AC
  Filled 2022-08-12 – 2022-12-15 (×3): qty 60, 60d supply, fill #0
  Filled 2023-02-11: qty 60, 60d supply, fill #1
  Filled 2023-05-24: qty 60, 60d supply, fill #2

## 2022-08-12 MED ORDER — OXYCODONE HCL 5 MG PO TABS
10.0000 mg | ORAL_TABLET | Freq: Once | ORAL | Status: AC
  Administered 2022-08-12: 10 mg via ORAL
  Filled 2022-08-12: qty 2

## 2022-08-12 MED ORDER — OXYCODONE HCL 10 MG PO TABS: 10.0000 mg | ORAL_TABLET | Freq: Four times a day (QID) | ORAL | 0 refills | Status: DC | PRN

## 2022-08-12 MED ORDER — DIPHENHYDRAMINE HCL 25 MG PO CAPS
25.0000 mg | ORAL_CAPSULE | ORAL | Status: DC | PRN
  Administered 2022-08-12: 25 mg via ORAL
  Filled 2022-08-12: qty 1

## 2022-08-12 MED ORDER — FOLIC ACID 1 MG PO TABS: 1.0000 mg | ORAL_TABLET | Freq: Every day | ORAL | 3 refills | Status: DC

## 2022-08-12 MED ORDER — SODIUM CHLORIDE 0.45 % IV SOLN: INTRAVENOUS | Status: DC

## 2022-08-12 MED ORDER — ACETAMINOPHEN 500 MG PO TABS
1000.0000 mg | ORAL_TABLET | Freq: Once | ORAL | Status: AC
  Administered 2022-08-12: 1000 mg via ORAL
  Filled 2022-08-12: qty 2

## 2022-08-12 MED ORDER — ONDANSETRON HCL 4 MG/2ML IJ SOLN
4.0000 mg | Freq: Four times a day (QID) | INTRAMUSCULAR | Status: DC | PRN
  Administered 2022-08-12: 4 mg via INTRAVENOUS
  Filled 2022-08-12: qty 2

## 2022-08-12 MED ORDER — SODIUM CHLORIDE 0.9% FLUSH: 9.0000 mL | INTRAVENOUS | Status: DC | PRN

## 2022-08-12 MED ORDER — NAPROXEN 500 MG PO TABS
500.0000 mg | ORAL_TABLET | Freq: Two times a day (BID) | ORAL | 2 refills | Status: DC
  Filled 2022-08-12: qty 60, 30d supply, fill #0

## 2022-08-12 MED ORDER — NAPROXEN 500 MG PO TABS: 500.0000 mg | ORAL_TABLET | Freq: Two times a day (BID) | ORAL | 2 refills | Status: DC

## 2022-08-12 MED ORDER — HYDROMORPHONE 1 MG/ML IV SOLN
INTRAVENOUS | Status: DC
  Administered 2022-08-12: 3.5 mg via INTRAVENOUS
  Administered 2022-08-12: 30 mg via INTRAVENOUS
  Filled 2022-08-12: qty 30

## 2022-08-12 MED ORDER — ONDANSETRON 4 MG PO TBDP
4.0000 mg | ORAL_TABLET | Freq: Once | ORAL | Status: AC
  Administered 2022-08-12: 4 mg via ORAL

## 2022-08-12 NOTE — Discharge Summary (Signed)
[facility] Discharge Summary   Patient ID: Austin [name] MRN: [id number] DOB/AGE: 1990/08/10 32 y.o.  Admit date: 08/12/2022 Discharge date: 08/12/2022  Primary Care Physician:  Tresa Garter, MD  Admission Diagnoses:  Principal Problem:   Sickle cell pain crisis [facility])   Discharge Medications:  Allergies as of 08/12/2022   No Known Allergies      Medication List     TAKE these medications    dicyclomine 20 MG tablet Commonly known as: BENTYL TAKE 1 TABLET BY MOUTH TWICE A DAY   folic acid 1 MG tablet Commonly known as: FOLVITE Take 1 tablet (1 mg total) by mouth daily.   loratadine 10 MG tablet Commonly known as: CLARITIN Take 1 tablet (10 mg total) by mouth daily. What changed:  when to take this reasons to take this   LORazepam 0.5 MG tablet Commonly known as: ATIVAN Take 1 tablet (0.5 mg total) by mouth at bedtime.   naproxen 500 MG tablet Commonly known as: Naprosyn Take 1 tablet (500 mg total) by mouth 2 (two) times daily with a meal.   ondansetron 4 MG disintegrating tablet Commonly known as: ZOFRAN-ODT Take 1 tablet (4 mg total) by mouth every 8 (eight) hours as needed for nausea or vomiting.   Oxycodone HCl 10 MG Tabs Take 1 tablet (10 mg total) by mouth every 6 (six) hours as needed.   triamcinolone 55 MCG/ACT Aero nasal inhaler Commonly known as: NASACORT Place 2 sprays into the nose daily. 2 sprays each nostril at night   Vitamin D (Ergocalciferol) 1.25 MG (50000 UNIT) Caps capsule Commonly known as: DRISDOL TAKE 1 CAPSULE (50,000 UNITS TOTAL) BY MOUTH ONCE A WEEK. X 12 WEEKS.         Consults:  None  Significant Diagnostic Studies:  No results found.  History of Present Illness: Austin [name] is a 32 year old male with a medical history significant for sickle cell disease, chronic pain syndrome, opiate dependence and tolerance, and anemia of chronic disease presents with complaints of allover body pain  that is consistent with his typical pain crisis.  Patient was evaluated in the emergency department, agreed with EDP that patient was appropriate to transition to sickle cell clinic.  Reviewed all laboratory values, largely consistent with patient's baseline and did not warrant repeating at this time.  Patient's pain persists despite home medications of Tylenol and ibuprofen.  Patient is out of his home oxycodone.  He attributes his pain crisis to mostly working outside in cold weather and running out of his home pain medications.  Currently, patient's pain intensity is 8/10 characterized as constant and aching.  He denies any fever, chills, chest pain, or shortness of breath.  No urinary symptoms, nausea, vomiting, or diarrhea.   [facility] Course: Patient admitted to sickle cell day infusion clinic for management of pain crisis.  Reviewed all laboratory values, largely consistent with patient's baseline. Pain managed with IV Dilaudid PCA, IV Toradol, Tylenol, and IV fluids.  Patient's pain intensity decreased to 1/10.  He states that he can manage at home on prescribed pain medication.  Oxycodone 10 mg every 6 hours as needed#60 was sent to patient's pharmacy.  PDMP was reviewed prior to prescribing opiate medications and no inconsistencies were noted.  Both folic acid and naproxen were also sent to patient's pharmacy to complete treatment.  Patient does not warrant hospital admission today and will be discharged home in a hemodynamically stable condition.  Discharge instructions: Resume all  home medications.   Follow up with PCP as previously  scheduled.   Discussed the importance of drinking 64 ounces of water daily, dehydration of red blood cells may lead further sickling.   Avoid all stressors that precipitate sickle cell pain crisis.     The patient was given clear instructions to go to ER or return to medical center if symptoms do not improve, worsen or new problems develop.    Physical Exam at Discharge:  BP (!) 144/88 (BP Location: Left Arm)   Pulse 71   Temp 97.7 F (36.5 C) (Temporal)   Resp 16   SpO2 97%   Physical Exam Constitutional:      Appearance: Normal appearance.  Eyes:     Pupils: Pupils are equal, round, and reactive to light.  Cardiovascular:     Rate and Rhythm: Normal rate and regular rhythm.     Pulses: Normal pulses.  Pulmonary:     Effort: Pulmonary effort is normal.  Abdominal:     General: Bowel sounds are normal.  Musculoskeletal:        General: Normal range of motion.  Skin:    General: Skin is warm.  Neurological:     General: No focal deficit present.     Mental Status: He is alert. Mental status is at baseline.  Psychiatric:        Mood and Affect: Mood normal.        Behavior: Behavior normal.        Thought Content: Thought content normal.        Judgment: Judgment normal.     Disposition at Discharge: Discharge disposition: 01-Home or Self Care       Discharge Orders: Discharge Instructions     Discharge patient   Complete by: As directed    Discharge disposition: 01-Home or Self Care   Discharge patient date: 08/12/2022       Condition at Discharge:   Stable  Time spent on Discharge:  Greater than 30 minutes.  Signed: [name]  APRN, MSN, FNP-C [facility] [address] [place], [place] [address] [phone]  08/12/2022, 2:39 PM

## 2022-08-12 NOTE — H&P (Signed)
[facility] History and Physical   Date: 08/12/2022  Patient name: Austin [name] Medical record number: [id number] Date of birth: 27-Jan-1991 Age: 32 y.o. Gender: male PCP: Tresa Garter, MD  Attending physician: Tresa Garter, MD  Chief Complaint: Sickle cell pain   History of Present Illness: Austin [name] is a 32 year old male with a medical history significant for sickle cell disease, chronic pain syndrome, opiate dependence and tolerance, and anemia of chronic disease presents with complaints of allover body pain that is consistent with his typical pain crisis.  Patient was evaluated in the emergency department, agreed with EDP that patient was appropriate to transition to sickle cell clinic.  Reviewed all laboratory values, largely consistent with patient's baseline and did not warrant repeating at this time.  Patient's pain persists despite home medications of Tylenol and ibuprofen.  Patient is out of his home oxycodone.  He attributes his pain crisis to mostly working outside in cold weather and running out of his home pain medications.  Currently, patient's pain intensity is 8/10 characterized as constant and aching.  He denies any fever, chills, chest pain, or shortness of breath.  No urinary symptoms, nausea, vomiting, or diarrhea.  Meds: Medications Prior to Admission  Medication Sig Dispense Refill Last Dose   dicyclomine (BENTYL) 20 MG tablet TAKE 1 TABLET BY MOUTH TWICE A DAY 60 tablet 11    loratadine (CLARITIN) 10 MG tablet Take 1 tablet (10 mg total) by mouth daily. (Patient taking differently: Take 10 mg by mouth daily as needed for allergies or rhinitis.) 30 tablet 6    LORazepam (ATIVAN) 0.5 MG tablet Take 1 tablet (0.5 mg total) by mouth at bedtime. 15 tablet 0    ondansetron (ZOFRAN-ODT) 4 MG disintegrating tablet Take 1 tablet (4 mg total) by mouth every 8 (eight) hours as needed for nausea or vomiting. 30 tablet 3    Oxycodone HCl 10  MG TABS Take 1 tablet (10 mg total) by mouth every 6 (six) hours as needed. 60 tablet 0    triamcinolone (NASACORT) 55 MCG/ACT AERO nasal inhaler Place 2 sprays into the nose daily. 2 sprays each nostril at night (Patient not taking: Reported on 07/05/2022) 1 each 4    Vitamin D, Ergocalciferol, (DRISDOL) 1.25 MG (50000 UNIT) CAPS capsule TAKE 1 CAPSULE (50,000 UNITS TOTAL) BY MOUTH ONCE A WEEK. X 12 WEEKS. 12 capsule 3     Allergies: Patient has no known allergies. Past Medical History:  Diagnosis Date   Acute maxillary sinusitis    Eczema    Sickle cell anemia ([place]) .   Past Surgical History:  Procedure Laterality Date   FOOT SURGERY Left 12/25/2020   WISDOM TOOTH EXTRACTION     Family History  Problem Relation Age of Onset   Sickle cell trait Mother    Diabetes Father    Hypertension Father    Social History   Socioeconomic History   Marital status: Married    Spouse name: Not on file   Number of children: Not on file   Years of education: Not on file   Highest education level: Not on file  Occupational History   Not on file  Tobacco Use   Smoking status: Former    Packs/day: 0.10    Types: Cigarettes    Quit date: 11/23/2013    Years since quitting: 8.7   Smokeless tobacco: Never  Vaping Use   Vaping Use: Never used  Substance and Sexual Activity   Alcohol  use: Not Currently   Drug use: Yes    Types: Marijuana    Comment: occasionally   Sexual activity: Yes    Birth control/protection: None    Comment: Married  Other Topics Concern   Not on file  Social History Narrative   Not on file   Social Determinants of Health   Financial Resource Strain: Not on file  Food Insecurity: Not on file  Transportation Needs: Not on file  Physical Activity: Not on file  Stress: Not on file  Social Connections: Not on file  Intimate Partner Violence: Not on file  Review of Systems  Constitutional:  Negative for chills and fever.  HENT: Negative.    Respiratory:  Negative.    Cardiovascular: Negative.   Gastrointestinal: Negative.   Genitourinary: Negative.   Musculoskeletal:  Positive for back pain and joint pain.  Skin: Negative.   Neurological: Negative.   Endo/Heme/Allergies: Negative.   Psychiatric/Behavioral: Negative.       Physical Exam: There were no vitals taken for this visit. Physical Exam Constitutional:      Appearance: Normal appearance.  Eyes:     Pupils: Pupils are equal, round, and reactive to light.  Cardiovascular:     Rate and Rhythm: Normal rate and regular rhythm.  Pulmonary:     Effort: Pulmonary effort is normal.  Abdominal:     General: Bowel sounds are normal.  Musculoskeletal:        General: Normal range of motion.  Skin:    General: Skin is warm.  Neurological:     General: No focal deficit present.     Mental Status: He is alert. Mental status is at baseline.  Psychiatric:        Mood and Affect: Mood normal.        Behavior: Behavior normal.        Thought Content: Thought content normal.        Judgment: Judgment normal.      Lab results: Results for orders placed or performed during the hospital encounter of 08/11/22 (from the past 24 hour(s))  Comprehensive metabolic panel     Status: Abnormal   Collection Time: 08/11/22 11:05 PM  Result Value Ref Range   Sodium 135 135 - 145 mmol/L   Potassium 3.6 3.5 - 5.1 mmol/L   Chloride 100 98 - 111 mmol/L   CO2 26 22 - 32 mmol/L   Glucose, Bld 107 (H) 70 - 99 mg/dL   BUN 9 6 - 20 mg/dL   Creatinine, Ser 5.17 0.61 - 1.24 mg/dL   Calcium 9.4 8.9 - 61.6 mg/dL   Total Protein 8.3 (H) 6.5 - 8.1 g/dL   Albumin 4.6 3.5 - 5.0 g/dL   AST 12 (L) 15 - 41 U/L   ALT 22 0 - 44 U/L   Alkaline Phosphatase 55 38 - 126 U/L   Total Bilirubin 1.6 (H) 0.3 - 1.2 mg/dL   GFR, Estimated >07 >37 mL/min   Anion gap 9 5 - 15  CBC with Differential     Status: Abnormal   Collection Time: 08/11/22 11:05 PM  Result Value Ref Range   WBC 11.9 (H) 4.0 - 10.5 K/uL    RBC 4.84 4.22 - 5.81 MIL/uL   Hemoglobin 13.4 13.0 - 17.0 g/dL   HCT 10.6 (L) 26.9 - 48.5 %   MCV 76.4 (L) 80.0 - 100.0 fL   MCH 27.7 26.0 - 34.0 pg   MCHC 36.2 (H) 30.0 - 36.0 g/dL  RDW 14.6 11.5 - 15.5 %   Platelets 133 (L) 150 - 400 K/uL   nRBC 0.3 (H) 0.0 - 0.2 %   Neutrophils Relative % 62 %   Neutro Abs 7.2 1.7 - 7.7 K/uL   Lymphocytes Relative 29 %   Lymphs Abs 3.5 0.7 - 4.0 K/uL   Monocytes Relative 8 %   Monocytes Absolute 1.0 0.1 - 1.0 K/uL   Eosinophils Relative 1 %   Eosinophils Absolute 0.1 0.0 - 0.5 K/uL   Basophils Relative 0 %   Basophils Absolute 0.0 0.0 - 0.1 K/uL   Immature Granulocytes 0 %   Abs Immature Granulocytes 0.04 0.00 - 0.07 K/uL  Reticulocytes     Status: Abnormal   Collection Time: 08/11/22 11:05 PM  Result Value Ref Range   Retic Ct Pct 3.5 (H) 0.4 - 3.1 %   RBC. 4.87 4.22 - 5.81 MIL/uL   Retic Count, Absolute 170.9 19.0 - 186.0 K/uL   Immature Retic Fract 23.9 (H) 2.3 - 15.9 %    Imaging results:  No results found.   Assessment & Plan: Patient admitted to sickle cell day infusion center for management of pain crisis.  Patient is opiate tolerant Initiate IV dilaudid PCA.  IV fluids, 0.45% saline at 150 ml/hr Toradol 15 mg IV times one dose Tylenol 1000 mg by mouth times one dose Review CBC with differential, complete metabolic panel, and reticulocytes as results become available. Pain intensity will be reevaluated in context of functioning and relationship to baseline as care progresses If pain intensity remains elevated and/or sudden change in hemodynamic stability transition to inpatient services for higher level of care.     [name]  APRN, MSN, FNP-C [facility] [address] [place], [place] [address] [phone]  08/12/2022, 9:24 AM

## 2022-08-12 NOTE — ED Provider Notes (Signed)
[facility] DEPT Provider Note  CSN: [id number] Arrival date & time: 08/11/22 2233  Chief Complaint(s) Sickle Cell Pain Crisis  HPI Austin [name] is a 32 y.o. male with PMH sickle cell anemia who presents emergency department for evaluation of the sickle cell pain crisis.  Patient states that he has run out of his home oxycodone and has had significant difficulty getting a hold of sickle cell provider in the outpatient setting.  States that pain progressively worsened yesterday worse in the low back and the knees.  Denies chest pain, shortness of breath, headache, fever or other systemic symptoms.   Past Medical History Past Medical History:  Diagnosis Date   Acute maxillary sinusitis    Eczema    Sickle cell anemia ([place]) .   Patient Active Problem List   Diagnosis Date Noted   Hypokalemia 03/05/2021   Anemia of chronic disease 03/05/2021   Vitamin D deficiency 11/16/2020   Bilateral lower extremity pain 10/17/2019   Sickle cell anemia without crisis ([place]) 09/20/2019   Chronic pain syndrome 09/20/2019   Nausea 09/20/2019   Chronic, continuous use of opioids 09/20/2019   Medication management 03/20/2019   Bilious vomiting without nausea    Viral gastroenteritis 01/23/2019   Nausea vomiting and diarrhea    Generalized abdominal pain    Dehydration    Sickle cell pain crisis ([place]) 05/18/2018   Acute sickle cell crisis ([place]) 12/15/2017   Thrombocytopenia ([place]) 12/15/2017   Splenomegaly 12/15/2017   Sickle cell anemia with crisis ([place]) 12/15/2017   Spleen enlarged 08/18/2017   Home Medication(s) Prior to Admission medications   Medication Sig Start Date End Date Taking? Authorizing Provider  dicyclomine (BENTYL) 20 MG tablet TAKE 1 TABLET BY MOUTH TWICE A DAY 03/25/22   Tresa Garter, MD  loratadine (CLARITIN) 10 MG tablet Take 1 tablet (10 mg total) by mouth daily. Patient taking differently: Take 10 mg by mouth daily as needed for  allergies or rhinitis. 11/13/20   Vevelyn Francois, NP  LORazepam (ATIVAN) 0.5 MG tablet Take 1 tablet (0.5 mg total) by mouth at bedtime. 07/05/22   Fenton Foy, NP  ondansetron (ZOFRAN-ODT) 4 MG disintegrating tablet Take 1 tablet (4 mg total) by mouth every 8 (eight) hours as needed for nausea or vomiting. 05/31/22   Dorena Dew, FNP  Oxycodone HCl 10 MG TABS Take 1 tablet (10 mg total) by mouth every 6 (six) hours as needed. 07/20/22   Dorena Dew, FNP  triamcinolone (NASACORT) 55 MCG/ACT AERO nasal inhaler Place 2 sprays into the nose daily. 2 sprays each nostril at night Patient not taking: Reported on 07/05/2022 04/27/21   Rozetta Nunnery, MD  Vitamin D, Ergocalciferol, (DRISDOL) 1.25 MG (50000 UNIT) CAPS capsule TAKE 1 CAPSULE (50,000 UNITS TOTAL) BY MOUTH ONCE A WEEK. X 12 WEEKS. 04/13/22   Fenton Foy, NP  Past Surgical History Past Surgical History:  Procedure Laterality Date   FOOT SURGERY Left 12/25/2020   WISDOM TOOTH EXTRACTION     Family History Family History  Problem Relation Age of Onset   Sickle cell trait Mother    Diabetes Father    Hypertension Father     Social History Social History   Tobacco Use   Smoking status: Former    Packs/day: 0.10    Types: Cigarettes    Quit date: 11/23/2013    Years since quitting: 8.7   Smokeless tobacco: Never  Vaping Use   Vaping Use: Never used  Substance Use Topics   Alcohol use: Not Currently   Drug use: Yes    Types: Marijuana    Comment: occasionally   Allergies Patient has no known allergies.  Review of Systems Review of Systems  Musculoskeletal:  Positive for arthralgias, back pain and myalgias.    Physical Exam Vital Signs  I have reviewed the triage vital signs BP 122/84 (BP Location: Right Arm)   Pulse 76   Temp 98.3 F (36.8 C) (Oral)   Resp 17    SpO2 97%   Physical Exam Constitutional:      General: He is not in acute distress.    Appearance: Normal appearance.  HENT:     Head: Normocephalic and atraumatic.     Nose: No congestion or rhinorrhea.  Eyes:     General:        Right eye: No discharge.        Left eye: No discharge.     Extraocular Movements: Extraocular movements intact.     Pupils: Pupils are equal, round, and reactive to light.  Cardiovascular:     Rate and Rhythm: Normal rate and regular rhythm.     Heart sounds: No murmur heard. Pulmonary:     Effort: No respiratory distress.     Breath sounds: No wheezing or rales.  Abdominal:     General: There is no distension.     Tenderness: There is no abdominal tenderness.  Musculoskeletal:        General: Normal range of motion.     Cervical back: Normal range of motion.  Skin:    General: Skin is warm and dry.  Neurological:     General: No focal deficit present.     Mental Status: He is alert.     ED Results and Treatments Labs (all labs ordered are listed, but only abnormal results are displayed) Labs Reviewed  COMPREHENSIVE METABOLIC PANEL - Abnormal; Notable for the following components:      Result Value   Glucose, Bld 107 (*)    Total Protein 8.3 (*)    AST 12 (*)    Total Bilirubin 1.6 (*)    All other components within normal limits  CBC WITH DIFFERENTIAL/PLATELET - Abnormal; Notable for the following components:   WBC 11.9 (*)    HCT 37.0 (*)    MCV 76.4 (*)    MCHC 36.2 (*)    Platelets 133 (*)    nRBC 0.3 (*)    All other components within normal limits  RETICULOCYTES - Abnormal; Notable for the following components:   Retic Ct Pct 3.5 (*)    Immature Retic Fract 23.9 (*)    All other components within normal limits  Radiology No results found.  Pertinent labs & imaging results that were available during my  care of the patient were reviewed by me and considered in my medical decision making (see MDM for details).  Medications Ordered in ED Medications  oxyCODONE (Oxy IR/ROXICODONE) immediate release tablet 10 mg (10 mg Oral Given 08/12/22 0858)  ondansetron (ZOFRAN-ODT) disintegrating tablet 4 mg (4 mg Oral Given 08/12/22 0858)                                                                                                                                     Procedures Procedures  (including critical care time)  Medical Decision Making / ED Course   This patient presents to the ED for concern of sickle cell pain, this involves an extensive number of treatment options, and is a complaint that carries with it a high risk of complications and morbidity.  The differential diagnosis includes sickle cell pain crisis, chronic pain, acute chest, anemia  MDM: Patient seen the emergency room for evaluation of sickle cell pain.  Physical exam with generalized tenderness in the L-spine and in the knees but is otherwise unremarkable.  Laboratory evaluation with leukocytosis to 11.9 but is otherwise unremarkable.  Patient currently is not meeting inpatient sickle cell criteria, and due to significant boarding issues in the ER today, patient will likely have to remain in the waiting room for an extended period of time prior to IV placement or pain medications.  Thus, I spoke with [place] Hollis of the sickle cell clinic who will help facilitate outpatient pain clinic follow-up today.  Patient given single dose of his home oxycodone and Zofran and patient was discharged with outpatient sickle cell clinic follow-up today.   Additional history obtained:  -External records from outside source obtained and reviewed including: Chart review including previous notes, labs, imaging, consultation notes   Lab Tests: -I ordered, reviewed, and interpreted labs.   The pertinent results include:   Labs Reviewed   COMPREHENSIVE METABOLIC PANEL - Abnormal; Notable for the following components:      Result Value   Glucose, Bld 107 (*)    Total Protein 8.3 (*)    AST 12 (*)    Total Bilirubin 1.6 (*)    All other components within normal limits  CBC WITH DIFFERENTIAL/PLATELET - Abnormal; Notable for the following components:   WBC 11.9 (*)    HCT 37.0 (*)    MCV 76.4 (*)    MCHC 36.2 (*)    Platelets 133 (*)    nRBC 0.3 (*)    All other components within normal limits  RETICULOCYTES - Abnormal; Notable for the following components:   Retic Ct Pct 3.5 (*)    Immature Retic Fract 23.9 (*)    All other components within normal limits        Medicines ordered and prescription drug management: Meds ordered this encounter  Medications  oxyCODONE (Oxy IR/ROXICODONE) immediate release tablet 10 mg   ondansetron (ZOFRAN-ODT) disintegrating tablet 4 mg    -I have reviewed the patients home medicines and have made adjustments as needed  Critical interventions none  Consultations Obtained: I requested consultation with the sickle cell APP [place] Hollis,  and discussed lab and imaging findings as well as pertinent plan - they recommend: Outpatient clinic follow-up   Social Determinants of Health:  Factors impacting patients care include: none   Reevaluation: After the interventions noted above, I reevaluated the patient and found that they have :improved  Co morbidities that complicate the patient evaluation  Past Medical History:  Diagnosis Date   Acute maxillary sinusitis    Eczema    Sickle cell anemia ([facility]) .      Dispostion: I considered admission for this patient, but patient will follow-up outpatient in sickle cell clinic     Final Clinical Impression(s) / ED Diagnoses Final diagnoses:  Sickle cell pain crisis [facility])     @PCDICTATION @    , MD 08/12/22 [phone]

## 2022-08-12 NOTE — Progress Notes (Signed)
Pt admitted to the day hospital today for sickle cell pain treatment, pt came over to clinic from the Emergency Room. On arrival, pt rated 10/10 pain to back. Pt received Dilaudid PCA, Toradol 15 mg, and hydrated with IV fluids via PIV. Pt also received PO Benadryl 25 mg and PO Tylenol 1,000 mg. At discharge, pt rates 1/10 pain. AVS offered, but pt declined. Pt alert, oriented, and ambulatory at discharge. Pt discharged home with wife.

## 2022-08-23 ENCOUNTER — Other Ambulatory Visit (HOSPITAL_COMMUNITY): Payer: Self-pay

## 2022-08-24 ENCOUNTER — Other Ambulatory Visit (HOSPITAL_COMMUNITY): Payer: Self-pay

## 2022-08-26 ENCOUNTER — Other Ambulatory Visit (HOSPITAL_COMMUNITY): Payer: Self-pay

## 2022-09-13 ENCOUNTER — Other Ambulatory Visit: Payer: Self-pay | Admitting: Family Medicine

## 2022-09-13 DIAGNOSIS — D571 Sickle-cell disease without crisis: Secondary | ICD-10-CM

## 2022-09-14 ENCOUNTER — Other Ambulatory Visit (HOSPITAL_COMMUNITY): Payer: Self-pay

## 2022-09-14 MED ORDER — OXYCODONE HCL 10 MG PO TABS
10.0000 mg | ORAL_TABLET | Freq: Four times a day (QID) | ORAL | 0 refills | Status: DC | PRN
  Filled 2022-09-14: qty 60, 15d supply, fill #0

## 2022-09-14 NOTE — Telephone Encounter (Signed)
Please advise KH 

## 2022-09-26 ENCOUNTER — Emergency Department (HOSPITAL_COMMUNITY): Payer: 59

## 2022-09-26 ENCOUNTER — Emergency Department (HOSPITAL_COMMUNITY): Admission: EM | Admit: 2022-09-26 | Payer: 59

## 2022-09-26 ENCOUNTER — Other Ambulatory Visit: Payer: Self-pay

## 2022-09-26 ENCOUNTER — Encounter (HOSPITAL_COMMUNITY): Payer: Self-pay

## 2022-09-26 ENCOUNTER — Emergency Department (HOSPITAL_COMMUNITY)
Admission: EM | Admit: 2022-09-26 | Discharge: 2022-09-26 | Disposition: A | Discharge status: Home / Self Care | Payer: 59 | Attending: Emergency Medicine | Admitting: Emergency Medicine

## 2022-09-26 DIAGNOSIS — R112 Nausea with vomiting, unspecified: Secondary | ICD-10-CM | POA: Insufficient documentation

## 2022-09-26 DIAGNOSIS — D5700 Hb-SS disease with crisis, unspecified: Secondary | ICD-10-CM | POA: Diagnosis not present

## 2022-09-26 LAB — CBC WITH DIFFERENTIAL/PLATELET
Abs Immature Granulocytes: 0.05 10*3/uL (ref 0.00–0.07)
Basophils Absolute: 0 10*3/uL (ref 0.0–0.1)
Basophils Relative: 0 %
Eosinophils Absolute: 0.1 10*3/uL (ref 0.0–0.5)
Eosinophils Relative: 1 %
HCT: 36.9 % — ABNORMAL LOW (ref 39.0–52.0)
Hemoglobin: 13 g/dL (ref 13.0–17.0)
Immature Granulocytes: 0 %
Lymphocytes Relative: 15 %
Lymphs Abs: 1.9 10*3/uL (ref 0.7–4.0)
MCH: 28 pg (ref 26.0–34.0)
MCHC: 35.2 g/dL (ref 30.0–36.0)
MCV: 79.4 fL — ABNORMAL LOW (ref 80.0–100.0)
Monocytes Absolute: 1.1 10*3/uL — ABNORMAL HIGH (ref 0.1–1.0)
Monocytes Relative: 8 %
Neutro Abs: 9.5 10*3/uL — ABNORMAL HIGH (ref 1.7–7.7)
Neutrophils Relative %: 76 %
Platelets: 114 10*3/uL — ABNORMAL LOW (ref 150–400)
RBC: 4.65 MIL/uL (ref 4.22–5.81)
RDW: 14.5 % (ref 11.5–15.5)
WBC: 12.7 10*3/uL — ABNORMAL HIGH (ref 4.0–10.5)
nRBC: 0.3 % — ABNORMAL HIGH (ref 0.0–0.2)

## 2022-09-26 LAB — COMPREHENSIVE METABOLIC PANEL
ALT: 19 U/L (ref 0–44)
AST: 11 U/L — ABNORMAL LOW (ref 15–41)
Albumin: 4.8 g/dL (ref 3.5–5.0)
Alkaline Phosphatase: 60 U/L (ref 38–126)
Anion gap: 5 (ref 5–15)
BUN: 14 mg/dL (ref 6–20)
CO2: 25 mmol/L (ref 22–32)
Calcium: 9.1 mg/dL (ref 8.9–10.3)
Chloride: 105 mmol/L (ref 98–111)
Creatinine, Ser: 0.7 mg/dL (ref 0.61–1.24)
GFR, Estimated: >60 mL/min (ref >60)
Glucose, Bld: 116 mg/dL — ABNORMAL HIGH (ref 70–99)
Potassium: 3.8 mmol/L (ref 3.5–5.1)
Sodium: 135 mmol/L (ref 135–145)
Total Bilirubin: 1.6 mg/dL — ABNORMAL HIGH (ref 0.3–1.2)
Total Protein: 8.1 g/dL (ref 6.5–8.1)

## 2022-09-26 LAB — RETICULOCYTES
Immature Retic Fract: 24.7 % — ABNORMAL HIGH (ref 2.3–15.9)
RBC.: 4.6 MIL/uL (ref 4.22–5.81)
Retic Count, Absolute: 191.4 10*3/uL — ABNORMAL HIGH (ref 19.0–186.0)
Retic Ct Pct: 4.2 % — ABNORMAL HIGH (ref 0.4–3.1)

## 2022-09-26 MED ORDER — OXYCODONE HCL 5 MG PO TABS: 10.0000 mg | ORAL_TABLET | Freq: Once | ORAL | Status: DC

## 2022-09-26 MED ORDER — HYDROMORPHONE HCL 2 MG/ML IJ SOLN
2.0000 mg | INTRAMUSCULAR | Status: AC
  Administered 2022-09-26: 2 mg via INTRAVENOUS
  Filled 2022-09-26: qty 1

## 2022-09-26 MED ORDER — ONDANSETRON 4 MG PO TBDP
4.0000 mg | ORAL_TABLET | Freq: Once | ORAL | Status: DC
  Filled 2022-09-26: qty 1

## 2022-09-26 MED ORDER — HYDROMORPHONE HCL 1 MG/ML IJ SOLN: 0.5000 mg | INTRAMUSCULAR | Status: DC

## 2022-09-26 MED ORDER — SODIUM CHLORIDE 0.45 % IV SOLN: INTRAVENOUS | Status: DC

## 2022-09-26 MED ORDER — ONDANSETRON HCL 4 MG/2ML IJ SOLN
4.0000 mg | INTRAMUSCULAR | Status: DC | PRN
  Administered 2022-09-26: 4 mg via INTRAVENOUS
  Filled 2022-09-26: qty 2

## 2022-09-26 NOTE — ED Triage Notes (Addendum)
Patient having a sickle cell crisis since last night. Lower back pain and bilateral inner knee pain. Ambulatory in triage. Took '5mg'$  oxycodone at 8am. Vomiting in triage. Said lower abdominal pain is "slowly coming."

## 2022-09-26 NOTE — Discharge Instructions (Addendum)
Please keep your appointment with the sickle cell specialist and take your medications as prescribed.  Please remain hydrated and monitor your symptoms.  If symptoms worsen please return to ER.

## 2022-09-26 NOTE — ED Provider Notes (Signed)
[facility] Provider Note   CSN: [id number] Arrival date & time: 09/26/22  [id number]     History  Chief Complaint  Patient presents with   Sickle Cell Pain Crisis    Austin [name] is a 32 y.o. male history of sickle cell presented with sickle cell crisis that began this morning.  Patient states last night he felt nauseous and had 1 episode of nonbloody emesis.  Patient states he has been extremely active over the weekend with work and thinks that may be the cause of his symptoms.  Patient took 5 mg of oxycodone this morning to no relief.  Patient states he has pain in the low back and bilateral knees which is similar to previous sickle cell crises.  Patient chest pain, shortness of breath, abdominal pain, fevers, recent illnesses, dysuria  Home Medications Prior to Admission medications   Medication Sig Start Date End Date Taking? Authorizing Provider  dicyclomine (BENTYL) 20 MG tablet TAKE 1 TABLET BY MOUTH TWICE A DAY 03/25/22   Tresa Garter, MD  folic acid (FOLVITE) 1 MG tablet Take 1 tablet (1 mg total) by mouth daily. 08/12/22 08/12/23  Dorena Dew, FNP  loratadine (CLARITIN) 10 MG tablet Take 1 tablet (10 mg total) by mouth daily. Patient taking differently: Take 10 mg by mouth daily as needed for allergies or rhinitis. 11/13/20   Vevelyn Francois, NP  LORazepam (ATIVAN) 0.5 MG tablet Take 1 tablet (0.5 mg total) by mouth at bedtime. 07/05/22   Fenton Foy, NP  naproxen (NAPROSYN) 500 MG tablet Take 1 tablet (500 mg total) by mouth 2 (two) times daily with a meal. 08/12/22   Dorena Dew, FNP  ondansetron (ZOFRAN-ODT) 4 MG disintegrating tablet Take 1 tablet (4 mg total) by mouth every 8 (eight) hours as needed for nausea or vomiting. 05/31/22   Dorena Dew, FNP  Oxycodone HCl 10 MG TABS Take 1 tablet (10 mg total) by mouth every 6 (six) hours as needed. 09/14/22   Dorena Dew, FNP  triamcinolone (NASACORT)  55 MCG/ACT AERO nasal inhaler Place 2 sprays into the nose daily. 2 sprays each nostril at night Patient not taking: Reported on 07/05/2022 04/27/21   Rozetta Nunnery, MD  Vitamin D, Ergocalciferol, (DRISDOL) 1.25 MG (50000 UNIT) CAPS capsule TAKE 1 CAPSULE (50,000 UNITS TOTAL) BY MOUTH ONCE A WEEK. X 12 WEEKS. 04/13/22   Fenton Foy, NP      Allergies    Patient has no known allergies.    Review of Systems   Review of Systems See HPI Physical Exam Updated Vital Signs BP (!) 100/59   Pulse 67   Temp 97.9 F (36.6 C) (Oral)   Resp 14   Ht '5\' 7"'$  (1.702 m)   Wt 75 kg   SpO2 98%   BMI 25.90 kg/m  Physical Exam Vitals and nursing note reviewed.  Constitutional:      General: He is not in acute distress.    Appearance: He is well-developed.  HENT:     Head: Normocephalic and atraumatic.  Eyes:     Extraocular Movements: Extraocular movements intact.     Conjunctiva/sclera: Conjunctivae normal.     Pupils: Pupils are equal, round, and reactive to light.  Cardiovascular:     Rate and Rhythm: Normal rate and regular rhythm.     Pulses: Normal pulses.     Heart sounds: Normal heart sounds. No murmur heard.  Comments: 2+ bilateral radial/dorsalis pedis/posterior tibialis pulses with regular rate Pulmonary:     Effort: Pulmonary effort is normal. No respiratory distress.     Breath sounds: Normal breath sounds.  Abdominal:     Palpations: Abdomen is soft.     Tenderness: There is no abdominal tenderness. There is no guarding or rebound.  Musculoskeletal:        General: No swelling (Bilateral knees did not appear swollen to me). Normal range of motion.     Cervical back: Neck supple.     Comments: 5 out of 5 bilateral grip/hip flexion/plantarflexion/dorsiflexion No midline tenderness or step-off/crepitus/abnormalities palpated  Skin:    General: Skin is warm and dry.     Capillary Refill: Capillary refill takes less than 2 seconds.  Neurological:     General: No  focal deficit present.     Mental Status: He is alert and oriented to person, place, and time.     Comments: Sensation intact in all 4 limbs  Psychiatric:        Mood and Affect: Mood normal.     ED Results / Procedures / Treatments   Labs (all labs ordered are listed, but only abnormal results are displayed) Labs Reviewed  COMPREHENSIVE METABOLIC PANEL - Abnormal; Notable for the following components:      Result Value   Glucose, Bld 116 (*)    AST 11 (*)    Total Bilirubin 1.6 (*)    All other components within normal limits  CBC WITH DIFFERENTIAL/PLATELET - Abnormal; Notable for the following components:   WBC 12.7 (*)    HCT 36.9 (*)    MCV 79.4 (*)    Platelets 114 (*)    nRBC 0.3 (*)    Neutro Abs 9.5 (*)    Monocytes Absolute 1.1 (*)    All other components within normal limits  RETICULOCYTES - Abnormal; Notable for the following components:   Retic Ct Pct 4.2 (*)    Retic Count, Absolute 191.4 (*)    Immature Retic Fract 24.7 (*)    All other components within normal limits    EKG None  Radiology DG Chest 2 View  Result Date: 09/26/2022 CLINICAL DATA:  Cough with nausea.  Sickle cell crisis. EXAM: CHEST - 2 VIEW COMPARISON:  02/03/2022 FINDINGS: The lungs are clear without focal pneumonia, edema, pneumothorax or pleural effusion. The cardiopericardial silhouette is within normal limits for size. The visualized bony structures of the thorax are unremarkable. Telemetry leads overlie the chest. IMPRESSION: No active cardiopulmonary disease. Electronically Signed   By: Misty Stanley M.D.   On: 09/26/2022 10:09    Procedures Procedures    Medications Ordered in ED Medications  0.45 % sodium chloride infusion ( Intravenous New Bag/Given 09/26/22 0957)  ondansetron (ZOFRAN) injection 4 mg (4 mg Intravenous Given 09/26/22 0959)  HYDROmorphone (DILAUDID) injection 2 mg (2 mg Intravenous Given 09/26/22 1000)  HYDROmorphone (DILAUDID) injection 2 mg (2 mg Intravenous Given  09/26/22 1048)  HYDROmorphone (DILAUDID) injection 2 mg (2 mg Intravenous Given 09/26/22 1214)    ED Course/ Medical Decision Making/ A&P                             Medical Decision Making Amount and/or Complexity of Data Reviewed Labs: ordered. Radiology: ordered.  Risk Prescription drug management.   Austin [name] 32 y.o. presented today for sickle cell crisis  Review of prior external notes: 08/12/2022 admission discharge  Unique Tests and My Interpretation:  CMP: Unremarkable Reticulocytes: Slightly increased for reticulocyte count CBC: Similar to previous readings Chest x-ray: No acute cardiopulmonary changes  Discussion with Independent Historian: None  Discussion of Management of Tests: None  Risk:    Medium:  - prescription drug management   Risk Stratification Score: None  Plan: Patient presented for sickle cell crisis.  On exam patient appears uncomfortable but has stable vitals and does not appear in distress.  Sickle cell labs were ordered.  Patient will receive fluid and pain meds and be monitored.  Goal of managing patient's pain however patient's pain is not able to be managed patient may be admitted.  Patient stable at this time.  On recheck patient was receiving his third Dilaudid injection stated after this he should be good to go as his pain does come down to 4 out of 10.  Patient had stable vitals and did not appear to be in distress at this time.  If patient's pain returns to baseline patient may be discharged.  On recheck patient stated his pain is back to baseline and that he feels he can be discharged.  Patient states he has plenty of pain meds at home for his next appointments.  I spoke with the patient by the importance of monitoring symptoms and taking plenty of fluids.  Patient was given a work note.  Patient was given return precautions.patient stable for discharge at this time.  Patient verbalized understanding of  plan.         Final Clinical Impression(s) / ED Diagnoses Final diagnoses:  Sickle cell pain crisis [facility])    Rx / DC Orders ED Discharge Orders     None         Elvina Sidle 09/26/22 1312    Pattricia Boss, MD 09/26/22 1549

## 2022-09-26 NOTE — ED Notes (Signed)
ED PA at BS 

## 2022-09-27 ENCOUNTER — Emergency Department (HOSPITAL_COMMUNITY)
Admission: EM | Admit: 2022-09-27 | Discharge: 2022-09-27 | Disposition: A | Discharge status: Other Acute Inpatient Hospital | Payer: 59 | Attending: Emergency Medicine | Admitting: Emergency Medicine

## 2022-09-27 ENCOUNTER — Other Ambulatory Visit: Payer: Self-pay

## 2022-09-27 ENCOUNTER — Encounter (HOSPITAL_COMMUNITY): Payer: Self-pay

## 2022-09-27 ENCOUNTER — Non-Acute Institutional Stay (HOSPITAL_BASED_OUTPATIENT_CLINIC_OR_DEPARTMENT_OTHER)
Admission: AD | Admit: 2022-09-27 | Discharge: 2022-09-27 | Disposition: A | Discharge status: Home / Self Care | Payer: 59 | Source: Other Acute Inpatient Hospital | Attending: Internal Medicine | Admitting: Internal Medicine

## 2022-09-27 DIAGNOSIS — D5700 Hb-SS disease with crisis, unspecified: Secondary | ICD-10-CM

## 2022-09-27 DIAGNOSIS — D57219 Sickle-cell/Hb-C disease with crisis, unspecified: Secondary | ICD-10-CM | POA: Insufficient documentation

## 2022-09-27 DIAGNOSIS — F1120 Opioid dependence, uncomplicated: Secondary | ICD-10-CM | POA: Insufficient documentation

## 2022-09-27 DIAGNOSIS — G894 Chronic pain syndrome: Secondary | ICD-10-CM | POA: Insufficient documentation

## 2022-09-27 DIAGNOSIS — Z832 Family history of diseases of the blood and blood-forming organs and certain disorders involving the immune mechanism: Secondary | ICD-10-CM | POA: Insufficient documentation

## 2022-09-27 DIAGNOSIS — Z87891 Personal history of nicotine dependence: Secondary | ICD-10-CM | POA: Insufficient documentation

## 2022-09-27 LAB — CBC WITH DIFFERENTIAL/PLATELET
Abs Immature Granulocytes: 0.06 10*3/uL (ref 0.00–0.07)
Basophils Absolute: 0 10*3/uL (ref 0.0–0.1)
Basophils Relative: 0 %
Eosinophils Absolute: 0.1 10*3/uL (ref 0.0–0.5)
Eosinophils Relative: 1 %
HCT: 36.9 % — ABNORMAL LOW (ref 39.0–52.0)
Hemoglobin: 13 g/dL (ref 13.0–17.0)
Immature Granulocytes: 1 %
Lymphocytes Relative: 10 %
Lymphs Abs: 1.2 10*3/uL (ref 0.7–4.0)
MCH: 28.1 pg (ref 26.0–34.0)
MCHC: 35.2 g/dL (ref 30.0–36.0)
MCV: 79.7 fL — ABNORMAL LOW (ref 80.0–100.0)
Monocytes Absolute: 1.4 10*3/uL — ABNORMAL HIGH (ref 0.1–1.0)
Monocytes Relative: 11 %
Neutro Abs: 9.9 10*3/uL — ABNORMAL HIGH (ref 1.7–7.7)
Neutrophils Relative %: 77 %
Platelets: 110 10*3/uL — ABNORMAL LOW (ref 150–400)
RBC: 4.63 MIL/uL (ref 4.22–5.81)
RDW: 14.5 % (ref 11.5–15.5)
WBC: 12.7 10*3/uL — ABNORMAL HIGH (ref 4.0–10.5)
nRBC: 0.2 % (ref 0.0–0.2)

## 2022-09-27 LAB — COMPREHENSIVE METABOLIC PANEL
ALT: 17 U/L (ref 0–44)
AST: 11 U/L — ABNORMAL LOW (ref 15–41)
Albumin: 4.8 g/dL (ref 3.5–5.0)
Alkaline Phosphatase: 66 U/L (ref 38–126)
Anion gap: 7 (ref 5–15)
BUN: 12 mg/dL (ref 6–20)
CO2: 26 mmol/L (ref 22–32)
Calcium: 9.3 mg/dL (ref 8.9–10.3)
Chloride: 102 mmol/L (ref 98–111)
Creatinine, Ser: 0.93 mg/dL (ref 0.61–1.24)
GFR, Estimated: >60 mL/min (ref >60)
Glucose, Bld: 104 mg/dL — ABNORMAL HIGH (ref 70–99)
Potassium: 3.8 mmol/L (ref 3.5–5.1)
Sodium: 135 mmol/L (ref 135–145)
Total Bilirubin: 2.1 mg/dL — ABNORMAL HIGH (ref 0.3–1.2)
Total Protein: 8.8 g/dL — ABNORMAL HIGH (ref 6.5–8.1)

## 2022-09-27 LAB — RETICULOCYTES
Immature Retic Fract: 25.2 % — ABNORMAL HIGH (ref 2.3–15.9)
RBC.: 4.64 MIL/uL (ref 4.22–5.81)
Retic Count, Absolute: 167.5 10*3/uL (ref 19.0–186.0)
Retic Ct Pct: 3.6 % — ABNORMAL HIGH (ref 0.4–3.1)

## 2022-09-27 MED ORDER — SODIUM CHLORIDE 0.45 % IV SOLN: INTRAVENOUS | Status: DC

## 2022-09-27 MED ORDER — DIPHENHYDRAMINE HCL 25 MG PO CAPS: 25.0000 mg | ORAL_CAPSULE | ORAL | Status: DC | PRN

## 2022-09-27 MED ORDER — KETOROLAC TROMETHAMINE 15 MG/ML IJ SOLN
15.0000 mg | INTRAMUSCULAR | Status: AC
  Administered 2022-09-27: 15 mg via INTRAVENOUS
  Filled 2022-09-27: qty 1

## 2022-09-27 MED ORDER — HYDROMORPHONE HCL 2 MG/ML IJ SOLN
2.0000 mg | INTRAMUSCULAR | Status: AC
  Administered 2022-09-27: 2 mg via INTRAVENOUS
  Filled 2022-09-27: qty 1

## 2022-09-27 MED ORDER — NALOXONE HCL 0.4 MG/ML IJ SOLN: 0.4000 mg | INTRAMUSCULAR | Status: DC | PRN

## 2022-09-27 MED ORDER — ONDANSETRON HCL 4 MG/2ML IJ SOLN
4.0000 mg | INTRAMUSCULAR | Status: DC | PRN
  Administered 2022-09-27: 4 mg via INTRAVENOUS
  Filled 2022-09-27: qty 2

## 2022-09-27 MED ORDER — ONDANSETRON HCL 4 MG/2ML IJ SOLN
4.0000 mg | Freq: Once | INTRAMUSCULAR | Status: AC
  Administered 2022-09-27: 4 mg via INTRAVENOUS
  Filled 2022-09-27: qty 2

## 2022-09-27 MED ORDER — HYDROMORPHONE 1 MG/ML IV SOLN
INTRAVENOUS | Status: DC
  Administered 2022-09-27: 30 mg via INTRAVENOUS
  Administered 2022-09-27: 3.5 mg via INTRAVENOUS
  Filled 2022-09-27: qty 30

## 2022-09-27 MED ORDER — SODIUM CHLORIDE 0.9% FLUSH: 9.0000 mL | INTRAVENOUS | Status: DC | PRN

## 2022-09-27 NOTE — Discharge Instructions (Signed)
Proceed to the sickle cell clinic to continue your treatment

## 2022-09-27 NOTE — ED Notes (Signed)
Patient provided discharge teaching and accompanied by ED tech to sickle cell clinic via wheelchair. IV still in place and saline locked.

## 2022-09-27 NOTE — ED Triage Notes (Signed)
Patient having a sickle cell pain crisis for 3 days. Generalized back pain along with nausea.

## 2022-09-27 NOTE — ED Provider Notes (Signed)
[facility] Provider Note   CSN: [id number] Arrival date & time: 09/27/22  [id number]     History  Chief Complaint  Patient presents with   Sickle Cell Pain Crisis    Austin [name] is a 32 y.o. male.   Sickle Cell Pain Crisis    Patient has a history of eczema, sickle cell anemia.  Patient presents to the ER for recurrent sickle cell pain crisis.  Patient was in the emergency room yesterday for the same issue.  He started feeling better after treatment and felt like he could manage at home.  Patient states later on the evening around midnight last night he started having pain again.  Patient tried taking his home medications but this morning he was feeling worse and felt like he needed to come back to the hospital.  He denies any chest pain or shortness of breath.  No abdominal.  No vomiting or diarrhea.  Home Medications Prior to Admission medications   Medication Sig Start Date End Date Taking? Authorizing Provider  dicyclomine (BENTYL) 20 MG tablet TAKE 1 TABLET BY MOUTH TWICE A DAY 03/25/22   Tresa Garter, MD  folic acid (FOLVITE) 1 MG tablet Take 1 tablet (1 mg total) by mouth daily. 08/12/22 08/12/23  Dorena Dew, FNP  loratadine (CLARITIN) 10 MG tablet Take 1 tablet (10 mg total) by mouth daily. Patient taking differently: Take 10 mg by mouth daily as needed for allergies or rhinitis. 11/13/20   Vevelyn Francois, NP  LORazepam (ATIVAN) 0.5 MG tablet Take 1 tablet (0.5 mg total) by mouth at bedtime. 07/05/22   Fenton Foy, NP  naproxen (NAPROSYN) 500 MG tablet Take 1 tablet (500 mg total) by mouth 2 (two) times daily with a meal. 08/12/22   Dorena Dew, FNP  ondansetron (ZOFRAN-ODT) 4 MG disintegrating tablet Take 1 tablet (4 mg total) by mouth every 8 (eight) hours as needed for nausea or vomiting. 05/31/22   Dorena Dew, FNP  Oxycodone HCl 10 MG TABS Take 1 tablet (10 mg total) by mouth every 6 (six) hours as  needed. 09/14/22   Dorena Dew, FNP  triamcinolone (NASACORT) 55 MCG/ACT AERO nasal inhaler Place 2 sprays into the nose daily. 2 sprays each nostril at night Patient not taking: Reported on 07/05/2022 04/27/21   Rozetta Nunnery, MD  Vitamin D, Ergocalciferol, (DRISDOL) 1.25 MG (50000 UNIT) CAPS capsule TAKE 1 CAPSULE (50,000 UNITS TOTAL) BY MOUTH ONCE A WEEK. X 12 WEEKS. 04/13/22   Fenton Foy, NP      Allergies    Patient has no known allergies.    Review of Systems   Review of Systems  Physical Exam Updated Vital Signs BP 127/83   Pulse 77   Temp 98.1 F (36.7 C) (Oral)   Resp 15   Ht 1.702 m ('5\' 7"'$ )   Wt 75 kg   SpO2 94%   BMI 25.90 kg/m  Physical Exam Vitals and nursing note reviewed.  Constitutional:      General: He is not in acute distress.    Appearance: He is well-developed.  HENT:     Head: Normocephalic and atraumatic.     Right Ear: External ear normal.     Left Ear: External ear normal.  Eyes:     General: No scleral icterus.       Right eye: No discharge.        Left eye: No discharge.  Conjunctiva/sclera: Conjunctivae normal.  Neck:     Trachea: No tracheal deviation.  Cardiovascular:     Rate and Rhythm: Normal rate and regular rhythm.  Pulmonary:     Effort: Pulmonary effort is normal. No respiratory distress.     Breath sounds: Normal breath sounds. No stridor. No wheezing or rales.  Abdominal:     General: Bowel sounds are normal. There is no distension.     Palpations: Abdomen is soft.     Tenderness: There is no abdominal tenderness. There is no guarding or rebound.  Musculoskeletal:        General: No tenderness or deformity.     Cervical back: Neck supple.  Skin:    General: Skin is warm and dry.     Findings: No rash.  Neurological:     General: No focal deficit present.     Mental Status: He is alert.     Cranial Nerves: No cranial nerve deficit, dysarthria or facial asymmetry.     Sensory: No sensory deficit.      Motor: No abnormal muscle tone or seizure activity.     Coordination: Coordination normal.  Psychiatric:        Mood and Affect: Mood normal.     ED Results / Procedures / Treatments   Labs (all labs ordered are listed, but only abnormal results are displayed) Labs Reviewed  COMPREHENSIVE METABOLIC PANEL - Abnormal; Notable for the following components:      Result Value   Glucose, Bld 104 (*)    Total Protein 8.8 (*)    AST 11 (*)    Total Bilirubin 2.1 (*)    All other components within normal limits  CBC WITH DIFFERENTIAL/PLATELET - Abnormal; Notable for the following components:   WBC 12.7 (*)    HCT 36.9 (*)    MCV 79.7 (*)    Platelets 110 (*)    Neutro Abs 9.9 (*)    Monocytes Absolute 1.4 (*)    All other components within normal limits  RETICULOCYTES - Abnormal; Notable for the following components:   Retic Ct Pct 3.6 (*)    Immature Retic Fract 25.2 (*)    All other components within normal limits    EKG None  Radiology DG Chest 2 View  Result Date: 09/26/2022 CLINICAL DATA:  Cough with nausea.  Sickle cell crisis. EXAM: CHEST - 2 VIEW COMPARISON:  02/03/2022 FINDINGS: The lungs are clear without focal pneumonia, edema, pneumothorax or pleural effusion. The cardiopericardial silhouette is within normal limits for size. The visualized bony structures of the thorax are unremarkable. Telemetry leads overlie the chest. IMPRESSION: No active cardiopulmonary disease. Electronically Signed   By: Misty Stanley M.D.   On: 09/26/2022 10:09    Procedures Procedures    Medications Ordered in ED Medications  0.45 % sodium chloride infusion ( Intravenous New Bag/Given 09/27/22 0929)  diphenhydrAMINE (BENADRYL) capsule 25-50 mg (has no administration in time range)  ondansetron (ZOFRAN) injection 4 mg (4 mg Intravenous Given 09/27/22 0949)  ketorolac (TORADOL) 15 MG/ML injection 15 mg (15 mg Intravenous Given 09/27/22 0922)  HYDROmorphone (DILAUDID) injection 2 mg (2 mg  Intravenous Given 09/27/22 [id number])  HYDROmorphone (DILAUDID) injection 2 mg (2 mg Intravenous Given 09/27/22 0952)  HYDROmorphone (DILAUDID) injection 2 mg (2 mg Intravenous Given 09/27/22 1028)    ED Course/ Medical Decision Making/ A&P Clinical Course as of 09/27/22 1105  Mon Sep 27, 2022  1042 Comprehensive metabolic panel(!) Metabolic panel with elevated bilirubin [JK]  1042 CBC WITH DIFFERENTIAL(!) Hemoglobin stable [JK]    Clinical Course User Index [JK] Dorie Rank, MD                             Medical Decision Making Problems Addressed: Sickle cell pain crisis [facility]): acute illness or injury that poses a threat to life or bodily functions  Amount and/or Complexity of Data Reviewed Labs: ordered. Decision-making details documented in ED Course.  Risk Prescription drug management. Parenteral controlled substances. Drug therapy requiring intensive monitoring for toxicity. Decision regarding hospitalization.   Patient presented to the ED with complaints of persistent pain associated with a sickle cell pain crisis.  Patient was in the ED yesterday and had to return today.  No signs of acute infection.  No evidence of acute chest syndrome.  Patient was treated with IV narcotic pain medications.  Patient has been in the ED for several hours and continues to have pain.  Discussed admission to the hospital for pain management.  Patient states he does not want to stay overnight.  I consulted with [place] Hollis from the sickle cell center.  Patient can proceed to the clinic to continue treatment for his pain crisis to hopefully stabilize him in order to be discharged later today.  Patient will be discharged to clinic with his IV in place.        Final Clinical Impression(s) / ED Diagnoses Final diagnoses:  Sickle cell pain crisis [facility])    Rx / DC Orders ED Discharge Orders     None         Dorie Rank, MD 09/27/22 1105

## 2022-09-27 NOTE — Group Note (Unsigned)
Date:  09/27/2022 Time:  10:23 AM  Group Topic/Focus:  Goals Group:   The focus of this group is to help patients establish daily goals to achieve during treatment and discuss how the patient can incorporate goal setting into their daily lives to aide in recovery. Orientation:   The focus of this group is to educate the patient on the purpose and policies of crisis stabilization and provide a format to answer questions about their admission.  The group details unit policies and expectations of patients while admitted.     Participation Level:  {BHH PARTICIPATION HD:[id number]  Participation Quality:  {BHH PARTICIPATION QUALITY:22265}  Affect:  {BHH AFFECT:22266}  Cognitive:  {BHH COGNITIVE:22267}  Insight: {BHH Insight2:20797}  Engagement in Group:  {BHH ENGAGEMENT IN JY:[id number]  Modes of Intervention:  {BHH MODES OF INTERVENTION:22269}  Additional Comments:  ***  Dub Mikes 09/27/2022, 10:23 AM

## 2022-09-27 NOTE — Progress Notes (Signed)
Patient admitted to day hospital today for sickle cell pain treatment. Pt came from Emergency Room. On arrival pt rates 4/10 pain to back. Pt received Dilaudid PCA, IV Zofran 4 mg, and hydrated with IV fluids via PIV. At discharge, pt rates pain 1/10. Pt is alert, oriented, and ambulatory at discharge.

## 2022-09-28 ENCOUNTER — Other Ambulatory Visit: Payer: Self-pay | Admitting: Family Medicine

## 2022-09-28 DIAGNOSIS — D571 Sickle-cell disease without crisis: Secondary | ICD-10-CM

## 2022-09-29 ENCOUNTER — Other Ambulatory Visit (HOSPITAL_COMMUNITY): Payer: Self-pay

## 2022-09-29 MED ORDER — OXYCODONE HCL 10 MG PO TABS
10.0000 mg | ORAL_TABLET | Freq: Four times a day (QID) | ORAL | 0 refills | Status: DC | PRN
  Filled 2022-09-29: qty 60, 15d supply, fill #0

## 2022-09-29 NOTE — Telephone Encounter (Signed)
Please advise KH 

## 2022-09-30 ENCOUNTER — Other Ambulatory Visit (HOSPITAL_COMMUNITY): Payer: Self-pay

## 2022-09-30 NOTE — H&P (Signed)
[facility] History and Physical   Date: 09/30/2022  Patient name: Austin [name] Medical record number: [id number] Date of birth: 11/19/1990 Age: 32 y.o. Gender: male PCP: Tresa Garter, MD  Attending physician: No att. providers found  Chief Complaint: Sickle cell pain    History of Present Illness: Austin [name] is a 32 year old male with a medical history significant for sickle cell disease, chronic pain syndrome, opiate dependence and tolerance, and anemia of chronic disease presents with complaints of allover body pain that is consistent with his typical pain crisis.  Patient was evaluated in the emergency department, agreed with EDP that patient was appropriate to transition to sickle cell clinic.  Reviewed all laboratory values, largely consistent with patient's baseline and did not warrant repeating at this time.  Patient's pain persists despite home medications of Tylenol and ibuprofen.  Patient is out of his home oxycodone.  He attributes his pain crisis to mostly working outside in cold weather and running out of his home pain medications.  Currently, patient's pain intensity is 8/10 characterized as constant and aching.  He denies any fever, chills, chest pain, or shortness of breath.  No urinary symptoms, nausea, vomiting, or diarrhea.  Meds: No medications prior to admission.    Allergies: Patient has no known allergies. Past Medical History:  Diagnosis Date   Acute maxillary sinusitis    Eczema    Sickle cell anemia ([place]) .   Past Surgical History:  Procedure Laterality Date   FOOT SURGERY Left 12/25/2020   WISDOM TOOTH EXTRACTION     Family History  Problem Relation Age of Onset   Sickle cell trait Mother    Diabetes Father    Hypertension Father    Social History   Socioeconomic History   Marital status: Married    Spouse name: Not on file   Number of children: Not on file   Years of education: Not on file   Highest education  level: Not on file  Occupational History   Not on file  Tobacco Use   Smoking status: Former    Packs/day: 0.10    Types: Cigarettes    Quit date: 11/23/2013    Years since quitting: 8.8   Smokeless tobacco: Never  Vaping Use   Vaping Use: Never used  Substance and Sexual Activity   Alcohol use: Not Currently   Drug use: Yes    Types: Marijuana    Comment: occasionally   Sexual activity: Yes    Birth control/protection: None    Comment: Married  Other Topics Concern   Not on file  Social History Narrative   Not on file   Social Determinants of Health   Financial Resource Strain: Not on file  Food Insecurity: Not on file  Transportation Needs: Not on file  Physical Activity: Not on file  Stress: Not on file  Social Connections: Not on file  Intimate Partner Violence: Not on file   Review of Systems  Constitutional: Negative.   HENT: Negative.    Eyes: Negative.   Respiratory: Negative.    Cardiovascular: Negative.   Gastrointestinal: Negative.   Genitourinary: Negative.   Musculoskeletal:  Positive for back pain and joint pain.  Skin: Negative.   Neurological: Negative.   Endo/Heme/Allergies: Negative.   Psychiatric/Behavioral: Negative.      Physical Exam: Blood pressure 124/67, pulse 78, temperature 98.6 F (37 C), temperature source Temporal, resp. rate 14, SpO2 100 %. Physical Exam Constitutional:      Appearance:  Normal appearance.  Eyes:     Pupils: Pupils are equal, round, and reactive to light.  Cardiovascular:     Rate and Rhythm: Normal rate and regular rhythm.     Pulses: Normal pulses.  Pulmonary:     Effort: Pulmonary effort is normal.  Abdominal:     General: Bowel sounds are normal.  Musculoskeletal:        General: Normal range of motion.  Skin:    General: Skin is warm.  Neurological:     General: No focal deficit present.     Mental Status: He is alert. Mental status is at baseline.  Psychiatric:        Mood and Affect: Mood  normal.        Behavior: Behavior normal.        Thought Content: Thought content normal.        Judgment: Judgment normal.      Lab results: No results found for this or any previous visit (from the past 24 hour(s)).  Imaging results:  No results found.   Assessment & Plan: Patient admitted to sickle cell day infusion center for management of pain crisis.  Patient is opiate tolerant Initiate IV dilaudid PCA.  IV fluids, 0.45% saline at 100 ml/hr Toradol 15 mg IV times one dose Tylenol 1000 mg by mouth times one dose Review CBC with differential, complete metabolic panel, and reticulocytes as results become available.  Pain intensity will be reevaluated in context of functioning and relationship to baseline as care progresses If pain intensity remains elevated and/or sudden change in hemodynamic stability transition to inpatient services for higher level of care.        [name]  APRN, MSN, FNP-C [facility] [address] [place], [place] [address] [phone]  09/30/2022, 3:45 PM

## 2022-10-01 ENCOUNTER — Emergency Department (HOSPITAL_COMMUNITY): Payer: 59

## 2022-10-01 ENCOUNTER — Other Ambulatory Visit: Payer: Self-pay

## 2022-10-01 ENCOUNTER — Inpatient Hospital Stay (HOSPITAL_COMMUNITY)
Admission: EM | Admit: 2022-10-01 | Discharge: 2022-10-02 | DRG: 812 | Disposition: A | Discharge status: Home / Self Care | Payer: 59 | Attending: Internal Medicine | Admitting: Internal Medicine

## 2022-10-01 ENCOUNTER — Encounter (HOSPITAL_COMMUNITY): Payer: Self-pay | Admitting: *Deleted

## 2022-10-01 DIAGNOSIS — D638 Anemia in other chronic diseases classified elsewhere: Secondary | ICD-10-CM | POA: Diagnosis present

## 2022-10-01 DIAGNOSIS — Z1152 Encounter for screening for COVID-19: Secondary | ICD-10-CM | POA: Diagnosis not present

## 2022-10-01 DIAGNOSIS — G894 Chronic pain syndrome: Secondary | ICD-10-CM | POA: Diagnosis present

## 2022-10-01 DIAGNOSIS — Z7951 Long term (current) use of inhaled steroids: Secondary | ICD-10-CM | POA: Diagnosis not present

## 2022-10-01 DIAGNOSIS — Z87891 Personal history of nicotine dependence: Secondary | ICD-10-CM

## 2022-10-01 DIAGNOSIS — D72829 Elevated white blood cell count, unspecified: Secondary | ICD-10-CM | POA: Diagnosis present

## 2022-10-01 DIAGNOSIS — D5700 Hb-SS disease with crisis, unspecified: Principal | ICD-10-CM | POA: Diagnosis present

## 2022-10-01 DIAGNOSIS — F1120 Opioid dependence, uncomplicated: Secondary | ICD-10-CM | POA: Diagnosis present

## 2022-10-01 DIAGNOSIS — Z79899 Other long term (current) drug therapy: Secondary | ICD-10-CM

## 2022-10-01 DIAGNOSIS — Z832 Family history of diseases of the blood and blood-forming organs and certain disorders involving the immune mechanism: Secondary | ICD-10-CM

## 2022-10-01 LAB — CBC WITH DIFFERENTIAL/PLATELET
Abs Immature Granulocytes: 0.04 10*3/uL (ref 0.00–0.07)
Basophils Absolute: 0 10*3/uL (ref 0.0–0.1)
Basophils Relative: 0 %
Eosinophils Absolute: 0.2 10*3/uL (ref 0.0–0.5)
Eosinophils Relative: 1 %
HCT: 34.5 % — ABNORMAL LOW (ref 39.0–52.0)
Hemoglobin: 12.3 g/dL — ABNORMAL LOW (ref 13.0–17.0)
Immature Granulocytes: 0 %
Lymphocytes Relative: 18 %
Lymphs Abs: 1.9 10*3/uL (ref 0.7–4.0)
MCH: 28 pg (ref 26.0–34.0)
MCHC: 35.7 g/dL (ref 30.0–36.0)
MCV: 78.6 fL — ABNORMAL LOW (ref 80.0–100.0)
Monocytes Absolute: 1.1 10*3/uL — ABNORMAL HIGH (ref 0.1–1.0)
Monocytes Relative: 10 %
Neutro Abs: 7.5 10*3/uL (ref 1.7–7.7)
Neutrophils Relative %: 71 %
Platelets: 120 10*3/uL — ABNORMAL LOW (ref 150–400)
RBC: 4.39 MIL/uL (ref 4.22–5.81)
RDW: 14.6 % (ref 11.5–15.5)
WBC: 10.8 10*3/uL — ABNORMAL HIGH (ref 4.0–10.5)
nRBC: 0.2 % (ref 0.0–0.2)

## 2022-10-01 LAB — COMPREHENSIVE METABOLIC PANEL
ALT: 17 U/L (ref 0–44)
AST: 12 U/L — ABNORMAL LOW (ref 15–41)
Albumin: 4.9 g/dL (ref 3.5–5.0)
Alkaline Phosphatase: 62 U/L (ref 38–126)
Anion gap: 11 (ref 5–15)
BUN: 12 mg/dL (ref 6–20)
CO2: 25 mmol/L (ref 22–32)
Calcium: 9.7 mg/dL (ref 8.9–10.3)
Chloride: 100 mmol/L (ref 98–111)
Creatinine, Ser: 0.95 mg/dL (ref 0.61–1.24)
GFR, Estimated: >60 mL/min (ref >60)
Glucose, Bld: 124 mg/dL — ABNORMAL HIGH (ref 70–99)
Potassium: 3.9 mmol/L (ref 3.5–5.1)
Sodium: 136 mmol/L (ref 135–145)
Total Bilirubin: 2.1 mg/dL — ABNORMAL HIGH (ref 0.3–1.2)
Total Protein: 8.9 g/dL — ABNORMAL HIGH (ref 6.5–8.1)

## 2022-10-01 LAB — LACTATE DEHYDROGENASE: LDH: 146 U/L (ref 98–192)

## 2022-10-01 LAB — RETICULOCYTES
Immature Retic Fract: 27.1 % — ABNORMAL HIGH (ref 2.3–15.9)
RBC.: 4.34 MIL/uL (ref 4.22–5.81)
Retic Count, Absolute: 144.1 10*3/uL (ref 19.0–186.0)
Retic Ct Pct: 3.3 % — ABNORMAL HIGH (ref 0.4–3.1)

## 2022-10-01 LAB — RESP PANEL BY RT-PCR (RSV, FLU A&B, COVID)  RVPGX2
Influenza A by PCR: NEGATIVE
Influenza B by PCR: NEGATIVE
Resp Syncytial Virus by PCR: NEGATIVE
SARS Coronavirus 2 by RT PCR: NEGATIVE

## 2022-10-01 MED ORDER — ONDANSETRON HCL 4 MG/2ML IJ SOLN
4.0000 mg | Freq: Four times a day (QID) | INTRAMUSCULAR | Status: DC | PRN
  Administered 2022-10-02 (×2): 4 mg via INTRAVENOUS
  Filled 2022-10-01 (×2): qty 2

## 2022-10-01 MED ORDER — KETOROLAC TROMETHAMINE 15 MG/ML IJ SOLN
15.0000 mg | Freq: Four times a day (QID) | INTRAMUSCULAR | Status: AC
  Administered 2022-10-01 – 2022-10-02 (×4): 15 mg via INTRAVENOUS
  Filled 2022-10-01 (×4): qty 1

## 2022-10-01 MED ORDER — POLYETHYLENE GLYCOL 3350 17 G PO PACK: 17.0000 g | PACK | Freq: Every day | ORAL | Status: DC | PRN

## 2022-10-01 MED ORDER — SODIUM CHLORIDE 0.9% FLUSH: 9.0000 mL | INTRAVENOUS | Status: DC | PRN

## 2022-10-01 MED ORDER — ENOXAPARIN SODIUM 40 MG/0.4ML IJ SOSY
40.0000 mg | PREFILLED_SYRINGE | INTRAMUSCULAR | Status: DC
  Administered 2022-10-01: 40 mg via SUBCUTANEOUS
  Filled 2022-10-01: qty 0.4

## 2022-10-01 MED ORDER — HYDROMORPHONE HCL 2 MG/ML IJ SOLN
2.0000 mg | INTRAMUSCULAR | Status: AC
  Administered 2022-10-01: 2 mg via INTRAVENOUS
  Filled 2022-10-01: qty 1

## 2022-10-01 MED ORDER — FOLIC ACID 1 MG PO TABS: 1.0000 mg | ORAL_TABLET | Freq: Every day | ORAL | Status: DC

## 2022-10-01 MED ORDER — HYDROMORPHONE HCL 2 MG/ML IJ SOLN
2.0000 mg | INTRAMUSCULAR | Status: AC | PRN
  Administered 2022-10-01 (×3): 2 mg via INTRAVENOUS
  Filled 2022-10-01 (×3): qty 1

## 2022-10-01 MED ORDER — KETOROLAC TROMETHAMINE 15 MG/ML IJ SOLN
15.0000 mg | INTRAMUSCULAR | Status: AC
  Administered 2022-10-01: 15 mg via INTRAVENOUS
  Filled 2022-10-01: qty 1

## 2022-10-01 MED ORDER — SENNOSIDES-DOCUSATE SODIUM 8.6-50 MG PO TABS
1.0000 | ORAL_TABLET | Freq: Two times a day (BID) | ORAL | Status: DC
  Administered 2022-10-01: 1 via ORAL
  Filled 2022-10-01: qty 1

## 2022-10-01 MED ORDER — KETOROLAC TROMETHAMINE 15 MG/ML IJ SOLN: 15.0000 mg | Freq: Four times a day (QID) | INTRAMUSCULAR | Status: DC

## 2022-10-01 MED ORDER — SODIUM CHLORIDE 0.45 % IV SOLN: INTRAVENOUS | Status: DC

## 2022-10-01 MED ORDER — OXYCODONE HCL 5 MG PO TABS: 10.0000 mg | ORAL_TABLET | Freq: Four times a day (QID) | ORAL | Status: DC | PRN

## 2022-10-01 MED ORDER — DIPHENHYDRAMINE HCL 25 MG PO CAPS
25.0000 mg | ORAL_CAPSULE | ORAL | Status: DC | PRN
  Administered 2022-10-01 (×2): 25 mg via ORAL
  Filled 2022-10-01: qty 2

## 2022-10-01 MED ORDER — DIPHENHYDRAMINE HCL 25 MG PO CAPS: 25.0000 mg | ORAL_CAPSULE | ORAL | Status: DC | PRN

## 2022-10-01 MED ORDER — NALOXONE HCL 0.4 MG/ML IJ SOLN: 0.4000 mg | INTRAMUSCULAR | Status: DC | PRN

## 2022-10-01 MED ORDER — ONDANSETRON HCL 4 MG/2ML IJ SOLN
4.0000 mg | Freq: Once | INTRAMUSCULAR | Status: AC
  Administered 2022-10-01: 4 mg via INTRAVENOUS
  Filled 2022-10-01: qty 2

## 2022-10-01 MED ORDER — LORAZEPAM 0.5 MG PO TABS
0.5000 mg | ORAL_TABLET | Freq: Every day | ORAL | Status: DC
  Administered 2022-10-01: 0.5 mg via ORAL
  Filled 2022-10-01: qty 1

## 2022-10-01 MED ORDER — HYDROMORPHONE 1 MG/ML IV SOLN
INTRAVENOUS | Status: DC
  Administered 2022-10-01: 30 mg via INTRAVENOUS
  Administered 2022-10-02: 0.5 mg via INTRAVENOUS
  Administered 2022-10-02: 2 mg via INTRAVENOUS
  Filled 2022-10-01: qty 30

## 2022-10-01 MED ORDER — ONDANSETRON HCL 4 MG/2ML IJ SOLN
4.0000 mg | INTRAMUSCULAR | Status: DC | PRN
  Administered 2022-10-01 (×2): 4 mg via INTRAVENOUS
  Filled 2022-10-01 (×2): qty 2

## 2022-10-01 NOTE — ED Triage Notes (Signed)
Here by POV from home for sickle cell crisis, onset 0300, reports pain only, denies other sx, pinpoints pain to knees, back, hands and arms.Rates 10/10. "Feels he needs to be admitted because this is the 3rd visit in 7days". Took oxycodone '10mg'$  at 0600. Ambulatory with steady gait.

## 2022-10-01 NOTE — H&P (Signed)
H&P  Patient Demographics:  Austin [name], is a 32 y.o. male  MRN: [id number]   DOB - 1991/03/11  Admit Date - 10/01/2022  Outpatient Primary MD for the patient is Tresa Garter, MD  Chief Complaint  Patient presents with   Sickle Cell Pain Crisis     HPI:   Austin [name]  is a 32 y.o. male with a medical history significant for sickle cell disease: Chronic pain syndrome, opiate dependence and tolerance, and anemia of chronic disease who presented to the emergency room today with major complaints of generalized body pain that is consistent with his typical sickle cell pain crisis.  Patient was last seen in the day hospital 5 days ago with the same complaints, he was treated and discharged with just pain down to baseline.  He rates his pain at 10/10 this morning, constant and throbbing and although generalized, it is worse in his lower back and lower extremities.  There is no joint swelling or redness.  He took his home pain medications with no sustained relief.  He denies any fever, cough, chest pain, shortness of breath, nausea, vomiting or diarrhea.  No urinary symptoms.  He denies any recent travels, sick contact or known exposure to COVID-19.  ED course: Vital signs: BP 128/84 (BP Location: Right Arm)  Pulse 82  Temp 98.4 F (36.9 C) (Oral)  Resp 18  Ht '5\' 8"'$  (1.727 m)  Wt 78.9 kg  SpO2 100%  BMI 26.46 kg/m.  Comprehensive metabolic panel was essentially within normal limit except for slightly elevated total bilirubin of 2.1, not so different from previous results, hemoglobin was 12.3, consistent with his baseline.  All other examinations were within normal limits.  Chest x-ray showed no focal consolidations.  Patient received multiple doses of IV Dilaudid in the emergency room with no sustained relief.  Patient will be admitted to inpatient services for further evaluations and management of sickle cell pain crisis.   Review of systems:  In addition to the HPI above, patient  reports No fever or chills No Headache, No changes with vision or hearing No problems swallowing food or liquids No chest pain, cough or shortness of breath No abdominal pain, No nausea or vomiting, Bowel movements are regular No blood in stool or urine No dysuria No new skin rashes or bruises No new joints pains-aches No new weakness, tingling, numbness in any extremity No recent weight gain or loss No polyuria, polydypsia or polyphagia No significant Mental Stressors  A full 10 point Review of Systems was done, except as stated above, all other Review of Systems were negative.  With Past History of the following :   Past Medical History:  Diagnosis Date   Acute maxillary sinusitis    Eczema    Sickle cell anemia ([place]) .      Past Surgical History:  Procedure Laterality Date   FOOT SURGERY Left 12/25/2020   WISDOM TOOTH EXTRACTION       Social History:   Social History   Tobacco Use   Smoking status: Former    Packs/day: 0.10    Types: Cigarettes    Quit date: 11/23/2013    Years since quitting: 8.8   Smokeless tobacco: Never  Substance Use Topics   Alcohol use: Not Currently     Lives - At home   Family History :   Family History  Problem Relation Age of Onset   Sickle cell trait Mother    Diabetes Father    Hypertension  Father      Home Medications:   Prior to Admission medications   Medication Sig Start Date End Date Taking? Authorizing Provider  dicyclomine (BENTYL) 20 MG tablet TAKE 1 TABLET BY MOUTH TWICE A DAY 03/25/22   Tresa Garter, MD  folic acid (FOLVITE) 1 MG tablet Take 1 tablet (1 mg total) by mouth daily. 08/12/22 08/12/23  Dorena Dew, FNP  loratadine (CLARITIN) 10 MG tablet Take 1 tablet (10 mg total) by mouth daily. Patient taking differently: Take 10 mg by mouth daily as needed for allergies or rhinitis. 11/13/20   Vevelyn Francois, NP  LORazepam (ATIVAN) 0.5 MG tablet Take 1 tablet (0.5 mg total) by mouth at bedtime.  07/05/22   Fenton Foy, NP  naproxen (NAPROSYN) 500 MG tablet Take 1 tablet (500 mg total) by mouth 2 (two) times daily with a meal. 08/12/22   Dorena Dew, FNP  ondansetron (ZOFRAN-ODT) 4 MG disintegrating tablet Take 1 tablet (4 mg total) by mouth every 8 (eight) hours as needed for nausea or vomiting. 05/31/22   Dorena Dew, FNP  Oxycodone HCl 10 MG TABS Take 1 tablet (10 mg total) by mouth every 6 (six) hours as needed. 09/29/22   Dorena Dew, FNP  triamcinolone (NASACORT) 55 MCG/ACT AERO nasal inhaler Place 2 sprays into the nose daily. 2 sprays each nostril at night Patient not taking: Reported on 07/05/2022 04/27/21   Rozetta Nunnery, MD  Vitamin D, Ergocalciferol, (DRISDOL) 1.25 MG (50000 UNIT) CAPS capsule TAKE 1 CAPSULE (50,000 UNITS TOTAL) BY MOUTH ONCE A WEEK. X 12 WEEKS. 04/13/22   Fenton Foy, NP     Allergies:   No Known Allergies   Physical Exam:   Vitals:   Vitals:   10/01/22 1136 10/01/22 1400  BP:  129/84  Pulse:  70  Resp:  10  Temp: 98.6 F (37 C)   SpO2:  100%   Physical Exam: Constitutional: Patient appears well-developed and well-nourished. Not in obvious distress. HENT: Normocephalic, atraumatic, External right and left ear normal. Oropharynx is clear and moist.  Eyes: Conjunctivae and EOM are normal. PERRLA, no scleral icterus. Neck: Normal ROM. Neck supple. No JVD. No tracheal deviation. No thyromegaly. CVS: RRR, S1/S2 +, no murmurs, no gallops, no carotid bruit.  Pulmonary: Effort and breath sounds normal, no stridor, rhonchi, wheezes, rales.  Abdominal: Soft. BS +, no distension, tenderness, rebound or guarding.  Musculoskeletal: Normal range of motion. No edema and no tenderness.  Lymphadenopathy: No lymphadenopathy noted, cervical, inguinal or axillary Neuro: Alert. Normal reflexes, muscle tone coordination. No cranial nerve deficit. Skin: Skin is warm and dry. No rash noted. Not diaphoretic. No erythema. No  pallor. Psychiatric: Normal mood and affect. Behavior, judgment, thought content normal.   Data Review:   CBC Recent Labs  Lab 09/26/22 0903 09/27/22 0850 10/01/22 0748  WBC 12.7* 12.7* 10.8*  HGB 13.0 13.0 12.3*  HCT 36.9* 36.9* 34.5*  PLT 114* 110* 120*  MCV 79.4* 79.7* 78.6*  MCH 28.0 28.1 28.0  MCHC 35.2 35.2 35.7  RDW 14.5 14.5 14.6  LYMPHSABS 1.9 1.2 1.9  MONOABS 1.1* 1.4* 1.1*  EOSABS 0.1 0.1 0.2  BASOSABS 0.0 0.0 0.0   ------------------------------------------------------------------------------------------------------------------  Chemistries  Recent Labs  Lab 09/26/22 0903 09/27/22 0850 10/01/22 0748  NA 135 135 136  K 3.8 3.8 3.9  CL 105 102 100  CO2 '25 26 25  '$ GLUCOSE 116* 104* 124*  BUN '14 12 12  '$ CREATININE 0.70  0.93 0.95  CALCIUM 9.1 9.3 9.7  AST 11* 11* 12*  ALT '19 17 17  '$ ALKPHOS 60 66 62  BILITOT 1.6* 2.1* 2.1*   ------------------------------------------------------------------------------------------------------------------ estimated creatinine clearance is 108 mL/min (by C-G formula based on SCr of 0.95 mg/dL). ------------------------------------------------------------------------------------------------------------------ No results for input(s): "TSH", "T4TOTAL", "T3FREE", "THYROIDAB" in the last 72 hours.  Invalid input(s): "FREET3"  Coagulation profile No results for input(s): "INR", "PROTIME" in the last 168 hours. ------------------------------------------------------------------------------------------------------------------- No results for input(s): "DDIMER" in the last 72 hours. -------------------------------------------------------------------------------------------------------------------  Cardiac Enzymes No results for input(s): "CKMB", "TROPONINI", "MYOGLOBIN" in the last 168 hours.  Invalid input(s):  "CK" ------------------------------------------------------------------------------------------------------------------ No results found for: "BNP"  ---------------------------------------------------------------------------------------------------------------  Urinalysis    Component Value Date/Time   COLORURINE YELLOW 10/14/2021 [place] 10/14/2021 1232   LABSPEC 1.042 (H) 10/14/2021 1232   PHURINE 7.0 10/14/2021 1232   GLUCOSEU NEGATIVE 10/14/2021 1232   HGBUR NEGATIVE 10/14/2021 1232   [place] 10/14/2021 1232   BILIRUBINUR negative 08/21/2021 1527   BILIRUBINUR negative 02/05/2020 0749   KETONESUR NEGATIVE 10/14/2021 1232   PROTEINUR NEGATIVE 10/14/2021 1232   UROBILINOGEN 2.0 (A) 08/21/2021 1527   UROBILINOGEN 1.0 10/18/2017 0841   NITRITE NEGATIVE 10/14/2021 1232   LEUKOCYTESUR NEGATIVE 10/14/2021 1232    ----------------------------------------------------------------------------------------------------------------   Imaging Results:    DG Chest 2 View  Result Date: 10/01/2022 CLINICAL DATA:  History of sickle cell anemia with cough and worsening body pain EXAM: CHEST - 2 VIEW COMPARISON:  Chest radiograph dated 09/26/2022 FINDINGS: Normal lung volumes. No focal consolidations. No pleural effusion or pneumothorax. Similar cardiomediastinal silhouette. Mild chronic osseous changes of sickle cell anemia. IMPRESSION: No focal consolidations. Electronically Signed   By: Darrin Nipper M.D.   On: 10/01/2022 08:13     Assessment & Plan:  Principal Problem:   Sickle cell anemia with crisis (HCC) Active Problems:   Chronic pain syndrome   Anemia of chronic disease  Hb Sickle Cell Disease with crisis: Admit patient, start IVF 0.45% Saline @ 125 mls/hour, start weight based Dilaudid PCA, start IV Toradol 15 mg Q 6 H, Restart oral home pain medications, Monitor vitals very closely, Re-evaluate pain scale regularly, 2 L of Oxygen by [place], Patient will be  re-evaluated for pain in the context of function and relationship to baseline as care progresses. Leukocytosis: Very mild, 10.8.  Chronic.  Most likely reactive, there is no evidence of inflammation or infections.  Will continue to monitor without antibiotics. Anemia of Chronic Disease: Hemoglobin is stable at baseline today.  There is no clinical indication for blood transfusion at this time.  Will monitor very closely and transfuse as appropriate. Chronic pain Syndrome: Restart and continue oral home pain medications.  DVT Prophylaxis: Subcut Lovenox   AM Labs Ordered, also please review Full Orders  Family Communication: Admission, patient's condition and plan of care including tests being ordered have been discussed with the patient who indicate understanding and agree with the plan and Code Status.  Code Status: Full Code  Consults called: None    Admission status: Inpatient    Time spent in minutes : 50 minutes  Angelica Chessman MD, [place], CPE, FACP 10/01/2022 at 2:56 PM

## 2022-10-01 NOTE — ED Provider Notes (Signed)
[place] EMERGENCY DEPARTMENT AT [facility] Provider Note   CSN: [id number] Arrival date & time: 10/01/22  0715     History  Chief Complaint  Patient presents with   Sickle Cell Pain Crisis    Austin [name] is a 32 y.o. male.  Patient presents to the emergency department today for evaluation of sickle cell pain.  Patient is opioid tolerant.  He was originally seen in the emergency department on 09/26/2022 and was treated, discharged but returned the following day and was admitted to the sickle cell day hospital.  He states that his pain had been improved.  He reports feeling very tired yesterday and then woke up last evening with increasing pain.  He has had his typical pain in his back and bilateral knees without joint swelling.  He also has developed pain in the bilateral elbows without swelling.  He denies fever, chills, nausea, vomiting, diarrhea.  He does report a "dry cough" that he has had since being in the hospital several days ago.  No ear pain, runny nose, sore throat.  He has been taking his home oxycodone without sufficient improvement, prompting ED visit today.  He does feel that he likely needs to be admitted to the hospital.       Home Medications Prior to Admission medications   Medication Sig Start Date End Date Taking? Authorizing Provider  dicyclomine (BENTYL) 20 MG tablet TAKE 1 TABLET BY MOUTH TWICE A DAY 03/25/22   Tresa Garter, MD  folic acid (FOLVITE) 1 MG tablet Take 1 tablet (1 mg total) by mouth daily. 08/12/22 08/12/23  Dorena Dew, FNP  loratadine (CLARITIN) 10 MG tablet Take 1 tablet (10 mg total) by mouth daily. Patient taking differently: Take 10 mg by mouth daily as needed for allergies or rhinitis. 11/13/20   Vevelyn Francois, NP  LORazepam (ATIVAN) 0.5 MG tablet Take 1 tablet (0.5 mg total) by mouth at bedtime. 07/05/22   Fenton Foy, NP  naproxen (NAPROSYN) 500 MG tablet Take 1 tablet (500 mg total) by mouth 2 (two)  times daily with a meal. 08/12/22   Dorena Dew, FNP  ondansetron (ZOFRAN-ODT) 4 MG disintegrating tablet Take 1 tablet (4 mg total) by mouth every 8 (eight) hours as needed for nausea or vomiting. 05/31/22   Dorena Dew, FNP  Oxycodone HCl 10 MG TABS Take 1 tablet (10 mg total) by mouth every 6 (six) hours as needed. 09/29/22   Dorena Dew, FNP  triamcinolone (NASACORT) 55 MCG/ACT AERO nasal inhaler Place 2 sprays into the nose daily. 2 sprays each nostril at night Patient not taking: Reported on 07/05/2022 04/27/21   Rozetta Nunnery, MD  Vitamin D, Ergocalciferol, (DRISDOL) 1.25 MG (50000 UNIT) CAPS capsule TAKE 1 CAPSULE (50,000 UNITS TOTAL) BY MOUTH ONCE A WEEK. X 12 WEEKS. 04/13/22   Fenton Foy, NP      Allergies    Patient has no known allergies.    Review of Systems   Review of Systems  Physical Exam Updated Vital Signs BP 128/84 (BP Location: Right Arm)   Pulse 82   Temp 98.4 F (36.9 C) (Oral)   Resp 18   Ht '5\' 8"'$  (1.727 m)   Wt 78.9 kg   SpO2 100%   BMI 26.46 kg/m  Physical Exam Vitals and nursing note reviewed.  Constitutional:      General: He is not in acute distress.    Appearance: He is well-developed.  HENT:     Head: Normocephalic and atraumatic.     Right Ear: External ear normal.     Left Ear: External ear normal.     Nose: Nose normal.     Mouth/Throat:     Mouth: Mucous membranes are moist.  Eyes:     General:        Right eye: No discharge.        Left eye: No discharge.     Conjunctiva/sclera: Conjunctivae normal.  Cardiovascular:     Rate and Rhythm: Normal rate and regular rhythm.     Heart sounds: Normal heart sounds.  Pulmonary:     Effort: Pulmonary effort is normal.     Breath sounds: Normal breath sounds.  Abdominal:     Palpations: Abdomen is soft.     Tenderness: There is no abdominal tenderness.  Musculoskeletal:     Cervical back: Normal range of motion and neck supple.     Comments: Full range of motion  of the elbows and knees bilaterally without joint effusions.  No significant tenderness to palpation of the lower extremities.  Skin:    General: Skin is warm and dry.  Neurological:     Mental Status: He is alert.     ED Results / Procedures / Treatments   Labs (all labs ordered are listed, but only abnormal results are displayed) Labs Reviewed  COMPREHENSIVE METABOLIC PANEL - Abnormal; Notable for the following components:      Result Value   Glucose, Bld 124 (*)    Total Protein 8.9 (*)    AST 12 (*)    Total Bilirubin 2.1 (*)    All other components within normal limits  CBC WITH DIFFERENTIAL/PLATELET - Abnormal; Notable for the following components:   WBC 10.8 (*)    Hemoglobin 12.3 (*)    HCT 34.5 (*)    MCV 78.6 (*)    Platelets 120 (*)    Monocytes Absolute 1.1 (*)    All other components within normal limits  RESP PANEL BY RT-PCR (RSV, FLU A&B, COVID)  [id number]  RETICULOCYTES    EKG None  Radiology DG Chest 2 View  Result Date: 10/01/2022 CLINICAL DATA:  History of sickle cell anemia with cough and worsening body pain EXAM: CHEST - 2 VIEW COMPARISON:  Chest radiograph dated 09/26/2022 FINDINGS: Normal lung volumes. No focal consolidations. No pleural effusion or pneumothorax. Similar cardiomediastinal silhouette. Mild chronic osseous changes of sickle cell anemia. IMPRESSION: No focal consolidations. Electronically Signed   By: Darrin Nipper M.D.   On: 10/01/2022 08:13    Procedures Procedures    Medications Ordered in ED Medications  0.45 % sodium chloride infusion ( Intravenous New Bag/Given 10/01/22 0817)  diphenhydrAMINE (BENADRYL) capsule 25-50 mg (25 mg Oral Given 10/01/22 0926)  ondansetron (ZOFRAN) injection 4 mg (4 mg Intravenous Given 10/01/22 0808)  ketorolac (TORADOL) 15 MG/ML injection 15 mg (15 mg Intravenous Given 10/01/22 0809)  HYDROmorphone (DILAUDID) injection 2 mg (2 mg Intravenous Given 10/01/22 0812)  HYDROmorphone (DILAUDID) injection 2 mg (2 mg  Intravenous Given 10/01/22 0843)  HYDROmorphone (DILAUDID) injection 2 mg (2 mg Intravenous Given 10/01/22 [id number])    ED Course/ Medical Decision Making/ A&P    Patient seen and examined. History obtained directly from patient.   Labs/EKG: Ordered CBC, CMP, reticulocytes, viral panel.  Imaging: Ordered chest x-ray.  Medications/Fluids: Ordered: Dilaudid, Toradol, Zofran, as needed Benadryl.   Most recent vital signs reviewed and are as follows: BP 128/84 (BP  Location: Right Arm)   Pulse 82   Temp 98.4 F (36.9 C) (Oral)   Resp 18   Ht '5\' 8"'$  (1.727 m)   Wt 78.9 kg   SpO2 100%   BMI 26.46 kg/m   Initial impression: Sickle cell pain crisis  9:29 AM Reassessment performed. Patient appears stable.  States that pain is minimally improved, currently 7 out of 10.  He has received 2 doses of IV Dilaudid as well as IV Toradol.  Labs personally reviewed and interpreted including: CBC with differential shows mild anemia at 12.3, white blood cell count 10.8, platelets 120; CMP with total bili at 2.1 otherwise unremarkable; respiratory viral panel was negative; reticulocytes pending.  Imaging personally visualized and interpreted including: Chest x-ray agree negative  Reviewed pertinent lab work and imaging with patient at bedside. Questions answered.   Most current vital signs reviewed and are as follows: BP 128/84 (BP Location: Right Arm)   Pulse 82   Temp 98.4 F (36.9 C) (Oral)   Resp 18   Ht '5\' 8"'$  (1.727 m)   Wt 78.9 kg   SpO2 100%   BMI 26.46 kg/m   Plan: Admit to hospital.   I have discussed the case with Dr. Doreene Burke, with sickle cell medicine.  Plan to admit to hospital.                            Medical Decision Making Amount and/or Complexity of Data Reviewed Labs: ordered. Radiology: ordered.  Risk Prescription drug management. Decision regarding hospitalization.   Patient with poorly controlled sickle cell pain, third visit in the past 7 days.  No obvious  signs of infection at this point.  Hemoglobin is at baseline.  No indication for transfusion.  Vital signs are stable.  Chest x-ray is clear and viral panel was negative given recent cough.        Final Clinical Impression(s) / ED Diagnoses Final diagnoses:  Sickle cell anemia with pain [facility])    Rx / DC Orders ED Discharge Orders     None         Carlisle Cater, PA-C 10/01/22 [id number]    Tegeler, Gwenyth Allegra, MD 10/01/22 [phone]

## 2022-10-02 LAB — CBC
HCT: 30.8 % — ABNORMAL LOW (ref 39.0–52.0)
Hemoglobin: 11.1 g/dL — ABNORMAL LOW (ref 13.0–17.0)
MCH: 28.1 pg (ref 26.0–34.0)
MCHC: 36 g/dL (ref 30.0–36.0)
MCV: 78 fL — ABNORMAL LOW (ref 80.0–100.0)
Platelets: 135 10*3/uL — ABNORMAL LOW (ref 150–400)
RBC: 3.95 MIL/uL — ABNORMAL LOW (ref 4.22–5.81)
RDW: 14.5 % (ref 11.5–15.5)
WBC: 7.9 10*3/uL (ref 4.0–10.5)
nRBC: 0.3 % — ABNORMAL HIGH (ref 0.0–0.2)

## 2022-10-02 LAB — HIV ANTIBODY (ROUTINE TESTING W REFLEX): HIV Screen 4th Generation wRfx: NONREACTIVE

## 2022-10-02 NOTE — Hospital Course (Signed)
Patient with history of sickle cell disease, anemia of chronic disease sickle cell chronic pain syndrome was admitted with sickle cell pain crisis.  Patient was admitted.  Initiated on pain regimen.  Was on Dilaudid PCA, Toradol, oral pain medications.  Hemoglobin was stable.  Has chronic's pain syndrome so started on home regimen.  Pain was reevaluated regularly.  Was placed on oxygen.  Patient did much better and at the end of the day felt like pain was down to 1 out of 10.  He was subsequently discharged home to follow-up with PCP.

## 2022-10-02 NOTE — Discharge Summary (Signed)
Physician Discharge Summary   Patient: Austin [name] MRN: [id number] DOB: [birth year]  Admit date:     10/01/2022  Discharge date: 10/02/2022  Discharge Physician: Barbette Merino   PCP: Tresa Garter, MD   Recommendations at discharge:   Patient to resume home regimen of follow-up with PCP  Discharge Diagnoses: Principal Problem:   Sickle cell anemia with crisis [facility]) Active Problems:   Chronic pain syndrome   Anemia of chronic disease  Resolved Problems:   * No resolved hospital problems. [facility] Course: Patient with history of sickle cell disease, anemia of chronic disease sickle cell chronic pain syndrome was admitted with sickle cell pain crisis.  Patient was admitted.  Initiated on pain regimen.  Was on Dilaudid PCA, Toradol, oral pain medications.  Hemoglobin was stable.  Has chronic's pain syndrome so started on home regimen.  Pain was reevaluated regularly.  Was placed on oxygen.  Patient did much better and at the end of the day felt like pain was down to 1 out of 10.  He was subsequently discharged home to follow-up with PCP.  Assessment and Plan: No notes have been filed under this hospital service. Service: Hospitalist     Consultants: None Procedures performed: Chest x-ray Disposition: Home Diet recommendation:  Discharge Diet Orders (From admission, onward)     Start     Ordered   10/02/22 0000  Diet - low sodium heart healthy        10/02/22 0928           Regular diet DISCHARGE MEDICATION: Allergies as of 10/02/2022   No Known Allergies      Medication List     TAKE these medications    dicyclomine 20 MG tablet Commonly known as: BENTYL TAKE 1 TABLET BY MOUTH TWICE A DAY What changed:  when to take this reasons to take this   folic acid 1 MG tablet Commonly known as: FOLVITE Take 1 tablet (1 mg total) by mouth daily.   loratadine 10 MG tablet Commonly known as: CLARITIN Take 1 tablet (10 mg total) by mouth daily. What  changed:  when to take this reasons to take this   LORazepam 0.5 MG tablet Commonly known as: ATIVAN Take 1 tablet (0.5 mg total) by mouth at bedtime.   naproxen 500 MG tablet Commonly known as: Naprosyn Take 1 tablet (500 mg total) by mouth 2 (two) times daily with a meal. What changed:  when to take this reasons to take this   ondansetron 4 MG disintegrating tablet Commonly known as: ZOFRAN-ODT Take 1 tablet (4 mg total) by mouth every 8 (eight) hours as needed for nausea or vomiting.   Oxycodone HCl 10 MG Tabs Take 1 tablet (10 mg total) by mouth every 6 (six) hours as needed. What changed: reasons to take this   triamcinolone 55 MCG/ACT Aero nasal inhaler Commonly known as: NASACORT Place 2 sprays into the nose daily. 2 sprays each nostril at night   Vitamin D (Ergocalciferol) 1.25 MG (50000 UNIT) Caps capsule Commonly known as: DRISDOL TAKE 1 CAPSULE (50,000 UNITS TOTAL) BY MOUTH ONCE A WEEK. X 12 WEEKS. What changed: See the new instructions. Notes to patient: AS SCHEDULED        Discharge Exam: Filed Weights   10/01/22 0727  Weight: 78.9 kg   Constitutional: NAD, calm, comfortable Eyes: PERRL, lids and conjunctivae normal ENMT: Mucous membranes are moist. Posterior pharynx clear of any exudate or lesions.Normal dentition.  Neck: normal, supple, no  masses, no thyromegaly Respiratory: clear to auscultation bilaterally, no wheezing, no crackles. Normal respiratory effort. No accessory muscle use.  Cardiovascular: Regular rate and rhythm, no murmurs / rubs / gallops. No extremity edema. 2+ pedal pulses. No carotid bruits.  Abdomen: no tenderness, no masses palpated. No hepatosplenomegaly. Bowel sounds positive.  Musculoskeletal: Good range of motion, no joint swelling or tenderness, Skin: no rashes, lesions, ulcers. No induration Neurologic: CN 2-12 grossly intact. Sensation intact, DTR normal. Strength 5/5 in all 4.  Psychiatric: Normal judgment and insight.  Alert and oriented x 3. Normal mood   Condition at discharge: good  The results of significant diagnostics from this hospitalization (including imaging, microbiology, ancillary and laboratory) are listed below for reference.   Imaging Studies: DG Chest 2 View  Result Date: 10/01/2022 CLINICAL DATA:  History of sickle cell anemia with cough and worsening body pain EXAM: CHEST - 2 VIEW COMPARISON:  Chest radiograph dated 09/26/2022 FINDINGS: Normal lung volumes. No focal consolidations. No pleural effusion or pneumothorax. Similar cardiomediastinal silhouette. Mild chronic osseous changes of sickle cell anemia. IMPRESSION: No focal consolidations. Electronically Signed   By: Darrin Nipper M.D.   On: 10/01/2022 08:13   DG Chest 2 View  Result Date: 09/26/2022 CLINICAL DATA:  Cough with nausea.  Sickle cell crisis. EXAM: CHEST - 2 VIEW COMPARISON:  02/03/2022 FINDINGS: The lungs are clear without focal pneumonia, edema, pneumothorax or pleural effusion. The cardiopericardial silhouette is within normal limits for size. The visualized bony structures of the thorax are unremarkable. Telemetry leads overlie the chest. IMPRESSION: No active cardiopulmonary disease. Electronically Signed   By: Misty Stanley M.D.   On: 09/26/2022 10:09    Microbiology: Results for orders placed or performed during the hospital encounter of 10/01/22  Resp panel by RT-PCR (RSV, Flu A&B, Covid) Anterior Nasal Swab     Status: None   Collection Time: 10/01/22  7:37 AM   Specimen: Anterior Nasal Swab  Result Value Ref Range Status   SARS Coronavirus 2 by RT PCR NEGATIVE NEGATIVE Final    Comment: (NOTE) SARS-CoV-2 target nucleic acids are NOT DETECTED.  The SARS-CoV-2 RNA is generally detectable in upper respiratory specimens during the acute phase of infection. The lowest concentration of SARS-CoV-2 viral copies this assay can detect is 138 copies/mL. A negative result does not preclude SARS-Cov-2 infection and should not  be used as the sole basis for treatment or other patient management decisions. A negative result may occur with  improper specimen collection/handling, submission of specimen other than nasopharyngeal swab, presence of viral mutation(s) within the areas targeted by this assay, and inadequate number of viral copies(<138 copies/mL). A negative result must be combined with clinical observations, patient history, and epidemiological information. The expected result is Negative.  Fact Sheet for Patients:  EntrepreneurPulse.com.au  Fact Sheet for Healthcare Providers:  IncredibleEmployment.be  This test is no t yet approved or cleared by the [place] FDA and  has been authorized for detection and/or diagnosis of SARS-CoV-2 by FDA under an Emergency Use Authorization (EUA). This EUA will remain  in effect (meaning this test can be used) for the duration of the COVID-19 declaration under Section 564(b)(1) of the Act, 21 U.S.C.section 360bbb-3(b)(1), unless the authorization is terminated  or revoked sooner.       Influenza A by PCR NEGATIVE NEGATIVE Final   Influenza B by PCR NEGATIVE NEGATIVE Final    Comment: (NOTE) The Xpert Xpress SARS-CoV-2/FLU/RSV plus assay is intended as an aid in the diagnosis  of influenza from Nasopharyngeal swab specimens and should not be used as a sole basis for treatment. Nasal washings and aspirates are unacceptable for Xpert Xpress SARS-CoV-2/FLU/RSV testing.  Fact Sheet for Patients: EntrepreneurPulse.com.au  Fact Sheet for Healthcare Providers: IncredibleEmployment.be  This test is not yet approved or cleared by the [place] FDA and has been authorized for detection and/or diagnosis of SARS-CoV-2 by FDA under an Emergency Use Authorization (EUA). This EUA will remain in effect (meaning this test can be used) for the duration of the COVID-19 declaration under Section  564(b)(1) of the Act, 21 U.S.C. section 360bbb-3(b)(1), unless the authorization is terminated or revoked.     Resp Syncytial Virus by PCR NEGATIVE NEGATIVE Final    Comment: (NOTE) Fact Sheet for Patients: EntrepreneurPulse.com.au  Fact Sheet for Healthcare Providers: IncredibleEmployment.be  This test is not yet approved or cleared by the [place] FDA and has been authorized for detection and/or diagnosis of SARS-CoV-2 by FDA under an Emergency Use Authorization (EUA). This EUA will remain in effect (meaning this test can be used) for the duration of the COVID-19 declaration under Section 564(b)(1) of the Act, 21 U.S.C. section 360bbb-3(b)(1), unless the authorization is terminated or revoked.  Performed at [facility], [place] [address], [place], [place] [address]     Labs: CBC: Recent Labs  Lab 09/26/22 [id number] 09/27/22 0850 10/01/22 0748 10/02/22 0730  WBC 12.7* 12.7* 10.8* 7.9  NEUTROABS 9.5* 9.9* 7.5  --   HGB 13.0 13.0 12.3* 11.1*  HCT 36.9* 36.9* 34.5* 30.8*  MCV 79.4* 79.7* 78.6* 78.0*  PLT 114* 110* 120* [id number]*   Basic Metabolic Panel: Recent Labs  Lab 09/26/22 0903 09/27/22 0850 10/01/22 0748  NA 135 135 136  K 3.8 3.8 3.9  CL 105 102 100  CO2 '25 26 25  '$ GLUCOSE 116* 104* 124*  BUN '14 12 12  '$ CREATININE 0.70 0.93 0.95  CALCIUM 9.1 9.3 9.7   Liver Function Tests: Recent Labs  Lab 09/26/22 0903 09/27/22 0850 10/01/22 0748  AST 11* 11* 12*  ALT '19 17 17  '$ ALKPHOS 60 66 62  BILITOT 1.6* 2.1* 2.1*  PROT 8.1 8.8* 8.9*  ALBUMIN 4.8 4.8 4.9   CBG: No results for input(s): "GLUCAP" in the last 168 hours.  Discharge time spent: greater than 30 minutes.  SignedBarbette Merino, MD [facility] 10/02/2022

## 2022-10-02 NOTE — Progress Notes (Signed)
Patient discharged to home with family, discharge instructions reviewed with patient who verbalized understanding. 

## 2022-10-03 ENCOUNTER — Encounter (HOSPITAL_COMMUNITY): Payer: Self-pay | Admitting: Internal Medicine

## 2022-10-03 ENCOUNTER — Inpatient Hospital Stay (HOSPITAL_COMMUNITY)
Admission: EM | Admit: 2022-10-03 | Discharge: 2022-10-05 | DRG: 812 | Disposition: A | Discharge status: Home / Self Care | Payer: 59 | Attending: Internal Medicine | Admitting: Internal Medicine

## 2022-10-03 ENCOUNTER — Other Ambulatory Visit: Payer: Self-pay

## 2022-10-03 DIAGNOSIS — E860 Dehydration: Secondary | ICD-10-CM | POA: Diagnosis present

## 2022-10-03 DIAGNOSIS — Z833 Family history of diabetes mellitus: Secondary | ICD-10-CM

## 2022-10-03 DIAGNOSIS — G894 Chronic pain syndrome: Secondary | ICD-10-CM | POA: Diagnosis present

## 2022-10-03 DIAGNOSIS — Z832 Family history of diseases of the blood and blood-forming organs and certain disorders involving the immune mechanism: Secondary | ICD-10-CM | POA: Diagnosis not present

## 2022-10-03 DIAGNOSIS — Z8249 Family history of ischemic heart disease and other diseases of the circulatory system: Secondary | ICD-10-CM | POA: Diagnosis not present

## 2022-10-03 DIAGNOSIS — D638 Anemia in other chronic diseases classified elsewhere: Secondary | ICD-10-CM | POA: Diagnosis present

## 2022-10-03 DIAGNOSIS — D5700 Hb-SS disease with crisis, unspecified: Secondary | ICD-10-CM | POA: Diagnosis present

## 2022-10-03 DIAGNOSIS — F1210 Cannabis abuse, uncomplicated: Secondary | ICD-10-CM | POA: Diagnosis present

## 2022-10-03 DIAGNOSIS — Z79899 Other long term (current) drug therapy: Secondary | ICD-10-CM

## 2022-10-03 DIAGNOSIS — Z87891 Personal history of nicotine dependence: Secondary | ICD-10-CM

## 2022-10-03 DIAGNOSIS — Z1152 Encounter for screening for COVID-19: Secondary | ICD-10-CM

## 2022-10-03 LAB — RETICULOCYTES
Immature Retic Fract: 28.8 % — ABNORMAL HIGH (ref 2.3–15.9)
RBC.: 3.86 MIL/uL — ABNORMAL LOW (ref 4.22–5.81)
Retic Count, Absolute: 139.3 10*3/uL (ref 19.0–186.0)
Retic Ct Pct: 3.6 % — ABNORMAL HIGH (ref 0.4–3.1)

## 2022-10-03 LAB — CBC WITH DIFFERENTIAL/PLATELET
Abs Immature Granulocytes: 0.08 10*3/uL — ABNORMAL HIGH (ref 0.00–0.07)
Basophils Absolute: 0 10*3/uL (ref 0.0–0.1)
Basophils Relative: 0 %
Eosinophils Absolute: 0.1 10*3/uL (ref 0.0–0.5)
Eosinophils Relative: 1 %
HCT: 31.1 % — ABNORMAL LOW (ref 39.0–52.0)
Hemoglobin: 11 g/dL — ABNORMAL LOW (ref 13.0–17.0)
Immature Granulocytes: 1 %
Lymphocytes Relative: 16 %
Lymphs Abs: 1.9 10*3/uL (ref 0.7–4.0)
MCH: 27.5 pg (ref 26.0–34.0)
MCHC: 35.4 g/dL (ref 30.0–36.0)
MCV: 77.8 fL — ABNORMAL LOW (ref 80.0–100.0)
Monocytes Absolute: 0.7 10*3/uL (ref 0.1–1.0)
Monocytes Relative: 6 %
Neutro Abs: 8.9 10*3/uL — ABNORMAL HIGH (ref 1.7–7.7)
Neutrophils Relative %: 76 %
Platelets: 112 10*3/uL — ABNORMAL LOW (ref 150–400)
RBC: 4 MIL/uL — ABNORMAL LOW (ref 4.22–5.81)
RDW: 14.6 % (ref 11.5–15.5)
WBC: 11.6 10*3/uL — ABNORMAL HIGH (ref 4.0–10.5)
nRBC: 0.3 % — ABNORMAL HIGH (ref 0.0–0.2)

## 2022-10-03 LAB — COMPREHENSIVE METABOLIC PANEL
ALT: 14 U/L (ref 0–44)
AST: 9 U/L — ABNORMAL LOW (ref 15–41)
Albumin: 4.6 g/dL (ref 3.5–5.0)
Alkaline Phosphatase: 49 U/L (ref 38–126)
Anion gap: 6 (ref 5–15)
BUN: 11 mg/dL (ref 6–20)
CO2: 24 mmol/L (ref 22–32)
Calcium: 9 mg/dL (ref 8.9–10.3)
Chloride: 105 mmol/L (ref 98–111)
Creatinine, Ser: 0.83 mg/dL (ref 0.61–1.24)
GFR, Estimated: >60 mL/min (ref >60)
Glucose, Bld: 100 mg/dL — ABNORMAL HIGH (ref 70–99)
Potassium: 4.4 mmol/L (ref 3.5–5.1)
Sodium: 135 mmol/L (ref 135–145)
Total Bilirubin: 1.7 mg/dL — ABNORMAL HIGH (ref 0.3–1.2)
Total Protein: 7.8 g/dL (ref 6.5–8.1)

## 2022-10-03 LAB — CBC
HCT: 33.5 % — ABNORMAL LOW (ref 39.0–52.0)
Hemoglobin: 11.9 g/dL — ABNORMAL LOW (ref 13.0–17.0)
MCH: 27.5 pg (ref 26.0–34.0)
MCHC: 35.5 g/dL (ref 30.0–36.0)
MCV: 77.5 fL — ABNORMAL LOW (ref 80.0–100.0)
Platelets: 125 10*3/uL — ABNORMAL LOW (ref 150–400)
RBC: 4.32 MIL/uL (ref 4.22–5.81)
RDW: 14.6 % (ref 11.5–15.5)
WBC: 10.8 10*3/uL — ABNORMAL HIGH (ref 4.0–10.5)
nRBC: 0.4 % — ABNORMAL HIGH (ref 0.0–0.2)

## 2022-10-03 LAB — CREATININE, SERUM
Creatinine, Ser: 0.91 mg/dL (ref 0.61–1.24)
GFR, Estimated: >60 mL/min (ref >60)

## 2022-10-03 LAB — RAPID URINE DRUG SCREEN, HOSP PERFORMED
Amphetamines: NOT DETECTED
Barbiturates: NOT DETECTED
Benzodiazepines: NOT DETECTED
Cocaine: NOT DETECTED
Opiates: POSITIVE — AB
Tetrahydrocannabinol: POSITIVE — AB

## 2022-10-03 MED ORDER — FOLIC ACID 1 MG PO TABS
1.0000 mg | ORAL_TABLET | Freq: Every day | ORAL | Status: DC
  Administered 2022-10-03 – 2022-10-04 (×2): 1 mg via ORAL
  Filled 2022-10-03 (×2): qty 1

## 2022-10-03 MED ORDER — ONDANSETRON HCL 4 MG/2ML IJ SOLN
4.0000 mg | Freq: Once | INTRAMUSCULAR | Status: AC
  Administered 2022-10-03: 4 mg via INTRAVENOUS
  Filled 2022-10-03: qty 2

## 2022-10-03 MED ORDER — KETOROLAC TROMETHAMINE 15 MG/ML IJ SOLN
15.0000 mg | Freq: Four times a day (QID) | INTRAMUSCULAR | Status: DC
  Administered 2022-10-03 – 2022-10-05 (×7): 15 mg via INTRAVENOUS
  Filled 2022-10-03 (×7): qty 1

## 2022-10-03 MED ORDER — NALOXONE HCL 0.4 MG/ML IJ SOLN: 0.4000 mg | INTRAMUSCULAR | Status: DC | PRN

## 2022-10-03 MED ORDER — ACETAMINOPHEN 500 MG PO TABS
1000.0000 mg | ORAL_TABLET | Freq: Once | ORAL | Status: AC
  Administered 2022-10-03: 1000 mg via ORAL
  Filled 2022-10-03: qty 2

## 2022-10-03 MED ORDER — SODIUM CHLORIDE 0.9% FLUSH: 9.0000 mL | INTRAVENOUS | Status: DC | PRN

## 2022-10-03 MED ORDER — DIPHENHYDRAMINE HCL 25 MG PO CAPS: 25.0000 mg | ORAL_CAPSULE | ORAL | Status: DC | PRN

## 2022-10-03 MED ORDER — HYDROMORPHONE 1 MG/ML IV SOLN
INTRAVENOUS | Status: DC
  Administered 2022-10-03: 2.5 mg via INTRAVENOUS
  Administered 2022-10-03: 30 mg via INTRAVENOUS
  Administered 2022-10-04: 3.79 mg via INTRAVENOUS
  Administered 2022-10-04: 2.5 mg via INTRAVENOUS
  Administered 2022-10-04: 0.5 mg via INTRAVENOUS
  Administered 2022-10-04: 2.5 mg via INTRAVENOUS
  Administered 2022-10-04: 5.79 mg via INTRAVENOUS
  Administered 2022-10-04: 6.29 mg via INTRAVENOUS
  Administered 2022-10-05 (×2): 1.5 mg via INTRAVENOUS
  Administered 2022-10-05: 3.5 mg via INTRAVENOUS
  Filled 2022-10-03 (×2): qty 30

## 2022-10-03 MED ORDER — DEXTROSE-NACL 5-0.45 % IV SOLN: INTRAVENOUS | Status: DC

## 2022-10-03 MED ORDER — DICYCLOMINE HCL 20 MG PO TABS: 20.0000 mg | ORAL_TABLET | Freq: Every day | ORAL | Status: DC | PRN

## 2022-10-03 MED ORDER — LORAZEPAM 0.5 MG PO TABS
0.5000 mg | ORAL_TABLET | Freq: Every evening | ORAL | Status: DC | PRN
  Filled 2022-10-03: qty 1

## 2022-10-03 MED ORDER — POLYETHYLENE GLYCOL 3350 17 G PO PACK: 17.0000 g | PACK | Freq: Every day | ORAL | Status: DC | PRN

## 2022-10-03 MED ORDER — SENNOSIDES-DOCUSATE SODIUM 8.6-50 MG PO TABS
1.0000 | ORAL_TABLET | Freq: Two times a day (BID) | ORAL | Status: DC
  Administered 2022-10-03 – 2022-10-04 (×3): 1 via ORAL
  Filled 2022-10-03 (×3): qty 1

## 2022-10-03 MED ORDER — HYDROMORPHONE HCL 2 MG/ML IJ SOLN
2.0000 mg | Freq: Once | INTRAMUSCULAR | Status: AC
  Administered 2022-10-03: 2 mg via INTRAVENOUS
  Filled 2022-10-03: qty 1

## 2022-10-03 MED ORDER — KETOROLAC TROMETHAMINE 15 MG/ML IJ SOLN
30.0000 mg | Freq: Once | INTRAMUSCULAR | Status: AC
  Administered 2022-10-03: 30 mg via INTRAVENOUS
  Filled 2022-10-03: qty 2

## 2022-10-03 MED ORDER — ENOXAPARIN SODIUM 40 MG/0.4ML IJ SOSY: 40.0000 mg | PREFILLED_SYRINGE | INTRAMUSCULAR | Status: DC

## 2022-10-03 MED ORDER — ONDANSETRON HCL 4 MG/2ML IJ SOLN
4.0000 mg | Freq: Four times a day (QID) | INTRAMUSCULAR | Status: DC | PRN
  Administered 2022-10-03 – 2022-10-05 (×4): 4 mg via INTRAVENOUS
  Filled 2022-10-03 (×4): qty 2

## 2022-10-03 NOTE — H&P (Signed)
History and Physical    Patient: Austin [name] [id number] DOB: [birth year] DOA: 10/03/2022 DOS: the patient was seen and examined on 10/03/2022 PCP: Dorena Dew, FNP  Patient coming from: Home  Chief Complaint:  Chief Complaint  Patient presents with   Sickle Cell Pain Crisis   HPI: Austin [name] is a 32 y.o. male with medical history significant of sickle cell disease who was just discharged yesterday after admission for sickle cell pain crisis.  Patient was treated with Dilaudid PCA Toradol and his oral oxycodone.  He reported feeling better pain was down to 1 out of 10.  He insisted on going home and was discharged.  Patient came back today complaining of the pain coming back after he went home.  He wanted to go to [facility] where he normally gets his care.  [facility] will not take patient for direct admission due to lack of beds.  His vitals and lab work were essentially unchanged.  Patient being readmitted for sickle cell pain crisis.  Review of Systems: As mentioned in the history of present illness. All other systems reviewed and are negative. Past Medical History:  Diagnosis Date   Acute maxillary sinusitis    Eczema    Sickle cell anemia ([place]) .   Past Surgical History:  Procedure Laterality Date   FOOT SURGERY Left 12/25/2020   WISDOM TOOTH EXTRACTION     Social History:  reports that he quit smoking about 8 years ago. His smoking use included cigarettes. He smoked an average of .1 packs per day. He has never used smokeless tobacco. He reports that he does not currently use alcohol. He reports current drug use. Drug: Marijuana.  No Known Allergies  Family History  Problem Relation Age of Onset   Sickle cell trait Mother    Diabetes Father    Hypertension Father     Prior to Admission medications   Medication Sig Start Date End Date Taking? Authorizing Provider  dicyclomine (BENTYL) 20 MG tablet TAKE 1 TABLET BY MOUTH TWICE A DAY Patient  taking differently: Take 20 mg by mouth daily as needed for spasms. 03/25/22  Yes Tresa Garter, MD  folic acid (FOLVITE) 1 MG tablet Take 1 tablet (1 mg total) by mouth daily. 08/12/22 08/12/23 Yes Dorena Dew, FNP  loratadine (CLARITIN) 10 MG tablet Take 1 tablet (10 mg total) by mouth daily. Patient taking differently: Take 10 mg by mouth daily as needed for allergies or rhinitis. 11/13/20  Yes Vevelyn Francois, NP  naproxen (NAPROSYN) 500 MG tablet Take 1 tablet (500 mg total) by mouth 2 (two) times daily with a meal. Patient taking differently: Take 500 mg by mouth 2 (two) times daily as needed for mild pain. 08/12/22  Yes Dorena Dew, FNP  ondansetron (ZOFRAN-ODT) 4 MG disintegrating tablet Take 1 tablet (4 mg total) by mouth every 8 (eight) hours as needed for nausea or vomiting. 05/31/22  Yes Dorena Dew, FNP  Oxycodone HCl 10 MG TABS Take 1 tablet (10 mg total) by mouth every 6 (six) hours as needed. Patient taking differently: Take 10 mg by mouth every 6 (six) hours as needed (For pain). 09/29/22  Yes Dorena Dew, FNP  Vitamin D, Ergocalciferol, (DRISDOL) 1.25 MG (50000 UNIT) CAPS capsule TAKE 1 CAPSULE (50,000 UNITS TOTAL) BY MOUTH ONCE A WEEK. X 12 WEEKS. Patient taking differently: Take 50,000 Units by mouth every 7 (seven) days. 04/13/22  Yes Fenton Foy, NP  LORazepam (  ATIVAN) 0.5 MG tablet Take 1 tablet (0.5 mg total) by mouth at bedtime. Patient not taking: Reported on 10/01/2022 07/05/22   Fenton Foy, NP  triamcinolone (NASACORT) 55 MCG/ACT AERO nasal inhaler Place 2 sprays into the nose daily. 2 sprays each nostril at night Patient not taking: Reported on 07/05/2022 04/27/21   Rozetta Nunnery, MD    Physical Exam: Vitals:   10/03/22 1418 10/03/22 1430 10/03/22 1500 10/03/22 1530  BP:  128/77 122/80 (!) 141/88  Pulse:  71 70 77  Resp:  '14 11 16  '$ Temp: 97.8 F (36.6 C)     TempSrc: Oral     SpO2:  90% 100% 100%  Weight:      Height:        Constitutional: NAD, calm, comfortable Eyes: PERRL, lids and conjunctivae normal ENMT: Mucous membranes are dry. Posterior pharynx clear of any exudate or lesions.Normal dentition.  Neck: normal, supple, no masses, no thyromegaly Respiratory: clear to auscultation bilaterally, no wheezing, no crackles. Normal respiratory effort. No accessory muscle use.  Cardiovascular: Regular rate and rhythm, no murmurs / rubs / gallops. No extremity edema. 2+ pedal pulses. No carotid bruits.  Abdomen: no tenderness, no masses palpated. No hepatosplenomegaly. Bowel sounds positive.  Musculoskeletal: Good range of motion, no joint swelling or tenderness, Skin: no rashes, lesions, ulcers. No induration Neurologic: CN 2-12 grossly intact. Sensation intact, DTR normal. Strength 5/5 in all 4.  Psychiatric: Normal judgment and insight. Alert and oriented x 3. Normal mood  Data Reviewed:  White count 11.6, hemoglobin 11.0, platelets 112.  Reticulocyte count 139.  Drug screen is positive for opiates on THC.  Assessment and Plan:  #1 sickle cell pain crisis: Patient will be readmitted.  Initiate Dilaudid PCA, Toradol, IV D5 half-normal saline at 125 cc an hour.  Oral oxycodone.  Will continue supportive care.  #2 anemia of chronic disease: Continue H&H.  #3 chronic pain syndrome: Continue the oxycodone.  #4 marijuana abuse: Counseling provided    Advance Care Planning:   Code Status: Full Code   Consults: None  Family Communication: None  Severity of Illness: The appropriate patient status for this patient is INPATIENT. Inpatient status is judged to be reasonable and necessary in order to provide the required intensity of service to ensure the patient's safety. The patient's presenting symptoms, physical exam findings, and initial radiographic and laboratory data in the context of their chronic comorbidities is felt to place them at high risk for further clinical deterioration. Furthermore, it is not  anticipated that the patient will be medically stable for discharge from the hospital within 2 midnights of admission.   * I certify that at the point of admission it is my clinical judgment that the patient will require inpatient hospital care spanning beyond 2 midnights from the point of admission due to high intensity of service, high risk for further deterioration and high frequency of surveillance required.*  AuthorBarbette Merino, MD 10/03/2022 4:56 PM  For on call review [email or web address].

## 2022-10-03 NOTE — ED Triage Notes (Signed)
Patient brought in from home by EMS with c/o sickle cell pain 10/10. Patient states he was DC from facility yesterday for same issue, he wishes to be transferred to [facility] for [place]. EMS started a 20G IV in L AC, gave 115mg of fentanyl and 4077mof NS.

## 2022-10-03 NOTE — ED Provider Notes (Signed)
[facility] Provider Note   CSN: [id number] Arrival date & time: 10/03/22  [id number]     History {Add pertinent medical, surgical, social history, OB history to HPI:1} No chief complaint on file.   Austin [name] is a 32 y.o. male with sickle cell, chronic pain, thrombocytopenia who presents with c/f sickle cell crisis.   Patient   HPI     Home Medications Prior to Admission medications   Medication Sig Start Date End Date Taking? Authorizing Provider  dicyclomine (BENTYL) 20 MG tablet TAKE 1 TABLET BY MOUTH TWICE A DAY Patient taking differently: Take 20 mg by mouth daily as needed for spasms. 03/25/22   Tresa Garter, MD  folic acid (FOLVITE) 1 MG tablet Take 1 tablet (1 mg total) by mouth daily. 08/12/22 08/12/23  Dorena Dew, FNP  loratadine (CLARITIN) 10 MG tablet Take 1 tablet (10 mg total) by mouth daily. Patient taking differently: Take 10 mg by mouth daily as needed for allergies or rhinitis. 11/13/20   Vevelyn Francois, NP  LORazepam (ATIVAN) 0.5 MG tablet Take 1 tablet (0.5 mg total) by mouth at bedtime. Patient not taking: Reported on 10/01/2022 07/05/22   Fenton Foy, NP  naproxen (NAPROSYN) 500 MG tablet Take 1 tablet (500 mg total) by mouth 2 (two) times daily with a meal. Patient taking differently: Take 500 mg by mouth 2 (two) times daily as needed for mild pain. 08/12/22   Dorena Dew, FNP  ondansetron (ZOFRAN-ODT) 4 MG disintegrating tablet Take 1 tablet (4 mg total) by mouth every 8 (eight) hours as needed for nausea or vomiting. 05/31/22   Dorena Dew, FNP  Oxycodone HCl 10 MG TABS Take 1 tablet (10 mg total) by mouth every 6 (six) hours as needed. Patient taking differently: Take 10 mg by mouth every 6 (six) hours as needed (For pain). 09/29/22   Dorena Dew, FNP  triamcinolone (NASACORT) 55 MCG/ACT AERO nasal inhaler Place 2 sprays into the nose daily. 2 sprays each nostril at  night Patient not taking: Reported on 07/05/2022 04/27/21   Rozetta Nunnery, MD  Vitamin D, Ergocalciferol, (DRISDOL) 1.25 MG (50000 UNIT) CAPS capsule TAKE 1 CAPSULE (50,000 UNITS TOTAL) BY MOUTH ONCE A WEEK. X 12 WEEKS. Patient taking differently: Take 50,000 Units by mouth every 7 (seven) days. 04/13/22   Fenton Foy, NP      Allergies    Patient has no known allergies.    Review of Systems   Review of Systems Review of systems {pos/neg:18640::"Negative","Positive"} for ***.  A 10 point review of systems was performed and is negative unless otherwise reported in HPI.  Physical Exam Updated Vital Signs There were no vitals taken for this visit. Physical Exam General: Normal appearing {Desc; male/male:11659}, lying in bed.  HEENT: PERRLA, Sclera anicteric, MMM, trachea midline.  Cardiology: RRR, no murmurs/rubs/gallops. BL radial and DP pulses equal bilaterally.  Resp: Normal respiratory rate and effort. CTAB, no wheezes, rhonchi, crackles.  Abd: Soft, non-tender, non-distended. No rebound tenderness or guarding.  GU: Deferred. MSK: No peripheral edema or signs of trauma. Extremities without deformity or TTP. No cyanosis or clubbing. Skin: warm, dry. No rashes or lesions. Back: No CVA tenderness Neuro: A&Ox4, CNs II-XII grossly intact. MAEs. Sensation grossly intact.  Psych: Normal mood and affect.   ED Results / Procedures / Treatments   Labs (all labs ordered are listed, but only abnormal results are displayed) Labs Reviewed - No  data to display  EKG None  Radiology No results found.  Procedures Procedures  {Document cardiac monitor, telemetry assessment procedure when appropriate:1}  Medications Ordered in ED Medications - No data to display  ED Course/ Medical Decision Making/ A&P                          Medical Decision Making   This patient presents to the ED for concern of ***, this involves an extensive number of treatment options, and is a  complaint that carries with it a high risk of complications and morbidity.  I considered the following differential and admission for this acute, potentially life threatening condition.   MDM:    ***     Labs: I Ordered, and personally interpreted labs.  The pertinent results include:  ***  Imaging Studies ordered: I ordered imaging studies including *** I independently visualized and interpreted imaging. I agree with the radiologist interpretation  Additional history obtained from ***.  External records from outside source obtained and reviewed including ***  Cardiac Monitoring: .The patient was maintained on a cardiac monitor.  I personally viewed and interpreted the cardiac monitored which showed an underlying rhythm of: ***  Reevaluation: After the interventions noted above, I reevaluated the patient and found that they have :{resolved/improved/worsened:23923::"improved"}  Social Determinants of Health: .***  Disposition:  ***  Co morbidities that complicate the patient evaluation . Past Medical History:  Diagnosis Date  . Acute maxillary sinusitis   . Eczema   . Sickle cell anemia ([facility]) .     Medicines No orders of the defined types were placed in this encounter.   I have reviewed the patients home medicines and have made adjustments as needed  Problem List / ED Course: Problem List Items Addressed This Visit   None        {Document critical care time when appropriate:1} {Document review of labs and clinical decision tools ie heart score, Chads2Vasc2 etc:1}  {Document your independent review of radiology images, and any outside records:1} {Document your discussion with family members, caretakers, and with consultants:1} {Document social determinants of health affecting pt's care:1} {Document your decision making why or why not admission, treatments were needed:1}  This note was created using dictation software, which may contain spelling or grammatical  errors.

## 2022-10-03 NOTE — ED Provider Notes (Signed)
Pt is requesting transfer to [facility].  I spoke to [facility] transfer center to see if they availability to accept pt's transfer request.   Dorie Rank, MD 10/03/22 1624

## 2022-10-03 NOTE — ED Notes (Signed)
ED TO INPATIENT HANDOFF REPORT  ED Nurse Name and Phone #: Hebert Soho, [place]  S Name/Age/Gender Austin [name] 32 y.o. male Room/Bed: WA05/WA05  Code Status   Code Status: Full Code  Home/SNF/Other Home Patient oriented to: self, place, time, and situation Is this baseline? Yes   Triage Complete: Triage complete  Chief Complaint Sickle cell disease with crisis [facility]) [D57.00]  Triage Note Patient brought in from home by EMS with c/o sickle cell pain 10/10. Patient states he was DC from facility yesterday for same issue, he wishes to be transferred to [facility] for [place]. EMS started a 20G IV in L AC, gave 171mg of fentanyl and 4014mof NS.    Allergies No Known Allergies  Level of Care/Admitting Diagnosis ED Disposition     ED Disposition  Admit   Condition  --   Comment  Hospital Area: [id number]]  Level of Care: Med-Surg [16]  May admit patient to [facility] equivalent level of care is available:: No  Covid Evaluation: Asymptomatic - no recent exposure (last 10 days) testing not required  Diagnosis: Sickle cell disease with crisis [id number] Physician: GAElwyn [id number]]  Attending Physician: GAElwyn [id number]:: I certify this patient will need inpatient services for at least 2 midnights  Estimated Length of Stay: 3          B Medical/Surgery History Past Medical History:  Diagnosis Date   Acute maxillary sinusitis    Eczema    S[place]le cell anemia ([place].   Past Surgical History:  Procedure Laterality Date   FOOT SURGERY Left 12/25/2020   WISDOM TOOTH EXTRACTION       A IV Location/Drains/Wounds Patient Lines/Drains/Airways Status     Active Line/Drains/Airways     Name Placement date Placement time Site Days   Peripheral IV 10/03/22 20 G 1" Left Antecubital 10/03/22  1007  Antecubital  less than 1            Intake/Output Last 24 hours No  intake or output data in the 24 hours ending 10/03/22 1717  Labs/Imaging Results for orders placed or performed during the hospital encounter of 10/03/22 (from the past 48 hour(s))  Reticulocytes     Status: Abnormal   Collection Time: 10/03/22 11:00 AM  Result Value Ref Range   Retic Ct Pct 3.6 (H) 0.4 - 3.1 %   RBC. 3.86 (L) 4.22 - 5.81 MIL/uL   Retic Count, Absolute 139.3 19.0 - 186.0 K/uL   Immature Retic Fract 28.8 (H) 2.3 - 15.9 %    Comment: Performed at [facility] [place] [id number] metabolic panel     Status: Abnormal   Collection Time: 10/03/22 11:00 AM  Result Value Ref Range   Sodium 135 135 - 145 mmol/L   Potassium 4.4 3.5 - 5.1 mmol/L   Chloride 105 98 - 111 mmol/L   CO2 24 22 - 32 mmol/L   Glucose, Bld 100 (H) 70 - 99 mg/dL    Comment: Glucose reference range applies only to samples taken after fasting for at least 8 hours.   BUN 11 6 - 20 mg/dL   Creatinine, Ser 0.83 0.61 - 1.24 mg/dL   Calcium 9.0 8.9 - 10.3 mg/dL   Total Protein 7.8 6.5 - 8.1 g/dL   Albumin 4.6 3.5 - 5.0 g/dL   AST 9 (L) 15 - 41  U/L   ALT 14 0 - 44 U/L   Alkaline Phosphatase 49 38 - 126 U/L   Total Bilirubin 1.7 (H) 0.3 - 1.2 mg/dL   GFR, Estimated >60 >60 mL/min    Comment: (NOTE) Calculated using the CKD-EPI Creatinine Equation (2021)    Anion gap 6 5 - 15    Comment: Performed at [facility], [place] [address], [place], [place] [address]  CBC WITH DIFFERENTIAL     Status: Abnormal   Collection Time: 10/03/22 11:00 AM  Result Value Ref Range   WBC 11.6 (H) 4.0 - 10.5 K/uL   RBC 4.00 (L) 4.22 - 5.81 MIL/uL   Hemoglobin 11.0 (L) 13.0 - 17.0 g/dL   HCT 31.1 (L) 39.0 - 52.0 %   MCV 77.8 (L) 80.0 - 100.0 fL   MCH 27.5 26.0 - 34.0 pg   MCHC 35.4 30.0 - 36.0 g/dL   RDW 14.6 11.5 - 15.5 %   Platelets 112 (L) 150 - 400 K/uL   nRBC 0.3 (H) 0.0 - 0.2 %   Neutrophils Relative % 76 %   Neutro Abs 8.9 (H) 1.7 - 7.7  K/uL   Lymphocytes Relative 16 %   Lymphs Abs 1.9 0.7 - 4.0 K/uL   Monocytes Relative 6 %   Monocytes Absolute 0.7 0.1 - 1.0 K/uL   Eosinophils Relative 1 %   Eosinophils Absolute 0.1 0.0 - 0.5 K/uL   Basophils Relative 0 %   Basophils Absolute 0.0 0.0 - 0.1 K/uL   Immature Granulocytes 1 %   Abs Immature Granulocytes 0.08 (H) 0.00 - 0.07 K/uL    Comment: Performed at [facility], [place] [address], [place], [place] [address]  Rapid urine drug screen (hospital performed)     Status: Abnormal   Collection Time: 10/03/22  3:45 PM  Result Value Ref Range   Opiates POSITIVE (A) NONE DETECTED   Cocaine NONE DETECTED NONE DETECTED   Benzodiazepines NONE DETECTED NONE DETECTED   Amphetamines NONE DETECTED NONE DETECTED   Tetrahydrocannabinol POSITIVE (A) NONE DETECTED   Barbiturates NONE DETECTED NONE DETECTED    Comment: (NOTE) DRUG SCREEN FOR MEDICAL PURPOSES ONLY.  IF CONFIRMATION IS NEEDED FOR ANY PURPOSE, NOTIFY LAB WITHIN 5 DAYS.  LOWEST DETECTABLE LIMITS FOR URINE DRUG SCREEN Drug Class                     Cutoff (ng/mL) Amphetamine and metabolites    1000 Barbiturate and metabolites    200 Benzodiazepine                 200 Opiates and metabolites        300 Cocaine and metabolites        300 THC                            50 Performed at [facility], [place] [address], [place], [place] [address]    No results found.  Pending Labs [employer] (From admission, onward)     Start     Ordered   Signed and Held  CBC  (enoxaparin (LOVENOX)    CrCl >/= 30 ml/min)  Once,   R       Comments: Baseline for enoxaparin therapy IF NOT ALREADY DRAWN.  Notify MD if PLT < 100 K.    Signed and Held   Signed and Held  Creatinine, serum  (enoxaparin (LOVENOX)  CrCl >/= 30 ml/min)  Once,   R       Comments: Baseline for enoxaparin therapy IF NOT ALREADY DRAWN.    Signed and Held   Signed and Held  Creatinine, serum  (enoxaparin (LOVENOX)     CrCl >/= 30 ml/min)  Weekly,   R     Comments: while on enoxaparin therapy    Signed and Held   Signed and Held  Comprehensive metabolic panel  Tomorrow morning,   R        Signed and Held   Signed and Held  CBC with Differential/Platelet  Tomorrow morning,   R        Signed and Held            Vitals/Pain Today's Vitals   10/03/22 1430 10/03/22 1433 10/03/22 1500 10/03/22 1530  BP: 128/77  122/80 (!) 141/88  Pulse: 71  70 77  Resp: '14  11 16  '$ Temp:      TempSrc:      SpO2: 90%  100% 100%  Weight:      Height:      PainSc:  6       Isolation Precautions No active isolations  Medications Medications  acetaminophen (TYLENOL) tablet 1,000 mg (1,000 mg Oral Given 10/03/22 1205)  ondansetron (ZOFRAN) injection 4 mg (4 mg Intravenous Given 10/03/22 1206)  ketorolac (TORADOL) 15 MG/ML injection 30 mg (30 mg Intravenous Given 10/03/22 1206)  HYDROmorphone (DILAUDID) injection 2 mg (2 mg Intravenous Given 10/03/22 1206)  HYDROmorphone (DILAUDID) injection 2 mg (2 mg Intravenous Given 10/03/22 1302)  HYDROmorphone (DILAUDID) injection 2 mg (2 mg Intravenous Given 10/03/22 1408)    Mobility walks     Focused Assessments N/A   R Recommendations: See Admitting Provider Note  Report given to:   Additional Notes: N/A

## 2022-10-03 NOTE — ED Notes (Signed)
[place] transfer center and spoke with Mickel Baas to facilitate possible transfer at Dr. Johnsie Kindred request. [facility] faxed.

## 2022-10-04 ENCOUNTER — Ambulatory Visit: Payer: Self-pay | Admitting: Nurse Practitioner

## 2022-10-04 LAB — COMPREHENSIVE METABOLIC PANEL
ALT: 13 U/L (ref 0–44)
AST: 9 U/L — ABNORMAL LOW (ref 15–41)
Albumin: 4 g/dL (ref 3.5–5.0)
Alkaline Phosphatase: 43 U/L (ref 38–126)
Anion gap: 7 (ref 5–15)
BUN: 10 mg/dL (ref 6–20)
CO2: 26 mmol/L (ref 22–32)
Calcium: 8.7 mg/dL — ABNORMAL LOW (ref 8.9–10.3)
Chloride: 101 mmol/L (ref 98–111)
Creatinine, Ser: 0.97 mg/dL (ref 0.61–1.24)
GFR, Estimated: >60 mL/min (ref >60)
Glucose, Bld: 105 mg/dL — ABNORMAL HIGH (ref 70–99)
Potassium: 3.6 mmol/L (ref 3.5–5.1)
Sodium: 134 mmol/L — ABNORMAL LOW (ref 135–145)
Total Bilirubin: 1.7 mg/dL — ABNORMAL HIGH (ref 0.3–1.2)
Total Protein: 6.9 g/dL (ref 6.5–8.1)

## 2022-10-04 LAB — CBC WITH DIFFERENTIAL/PLATELET
Abs Immature Granulocytes: 0.05 10*3/uL (ref 0.00–0.07)
Basophils Absolute: 0 10*3/uL (ref 0.0–0.1)
Basophils Relative: 0 %
Eosinophils Absolute: 0.1 10*3/uL (ref 0.0–0.5)
Eosinophils Relative: 1 %
HCT: 30.1 % — ABNORMAL LOW (ref 39.0–52.0)
Hemoglobin: 10.9 g/dL — ABNORMAL LOW (ref 13.0–17.0)
Immature Granulocytes: 1 %
Lymphocytes Relative: 37 %
Lymphs Abs: 2.8 10*3/uL (ref 0.7–4.0)
MCH: 27.6 pg (ref 26.0–34.0)
MCHC: 36.2 g/dL — ABNORMAL HIGH (ref 30.0–36.0)
MCV: 76.2 fL — ABNORMAL LOW (ref 80.0–100.0)
Monocytes Absolute: 0.6 10*3/uL (ref 0.1–1.0)
Monocytes Relative: 8 %
Neutro Abs: 4 10*3/uL (ref 1.7–7.7)
Neutrophils Relative %: 53 %
Platelets: 113 10*3/uL — ABNORMAL LOW (ref 150–400)
RBC: 3.95 MIL/uL — ABNORMAL LOW (ref 4.22–5.81)
RDW: 14.6 % (ref 11.5–15.5)
WBC: 7.6 10*3/uL (ref 4.0–10.5)
nRBC: 0.5 % — ABNORMAL HIGH (ref 0.0–0.2)

## 2022-10-04 MED ORDER — PROMETHAZINE HCL 25 MG PO TABS
25.0000 mg | ORAL_TABLET | Freq: Once | ORAL | Status: AC
  Administered 2022-10-04: 25 mg via ORAL
  Filled 2022-10-04: qty 1

## 2022-10-04 NOTE — Progress Notes (Signed)
SICKLE CELL SERVICE PROGRESS NOTE  Austin [name] [id number] DOB: 1991-07-12 DOA: 10/03/2022 PCP: Dorena Dew, FNP  Assessment/Plan: Principal Problem:   Sickle cell disease with crisis [facility]) Active Problems:   Acute sickle cell crisis ([place])   Dehydration   Anemia of chronic disease  Sickle cell pain crisis: Patient is asking to be discharged once again.  He is worried about his job.  Medically at that he did the same thing the day before and came back.  He is not ready to be discharged safely.  If he leaves he has to leave [place].  In the meantime continue Dilaudid PCA and Toradol. Anemia of chronic disease: Continue to monitor H&H. Dehydration: Continue hydration  Code Status: Full code Family Communication: No family at bedside Disposition Plan: [place]  Pager [phone]. If 7PM-7AM, please contact night-coverage.  10/04/2022, 10:31 AM  LOS: 1 day   Brief narrative:  Austin [name] is a 32 y.o. male with medical history significant of sickle cell disease who was just discharged yesterday after admission for sickle cell pain crisis.  Patient was treated with Dilaudid PCA Toradol and his oral oxycodone.  He reported feeling better pain was down to 1 out of 10.  He insisted on going home and was discharged.  Patient came back today complaining of the pain coming back after he went home.  He wanted to go to [facility] where he [facility] gets his care.  [facility] will not take patient for direct admission due to lack of beds.  His vitals and lab work were essentially unchanged.  Patient being readmitted for sickle cell pain crisis.   Consultants: None  Procedures: None  Antibiotics: None  HPI/Subjective: Patient is still having pain and nausea.  He is asking for discharge so he can go home and go to work.  His pain however is not fully controlled.  Objective: Vitals:   10/04/22 0203 10/04/22 0622 10/04/22 0622 10/04/22 1027   BP: 104/63  118/67 130/82  Pulse: (!) 54  74 75  Resp: '12 14 13 18  '$ Temp: 98 F (36.7 C)  97.8 F (36.6 C) 98 F (36.7 C)  TempSrc: Oral  Oral Oral  SpO2: 100% 100% 99% 100%  Weight:      Height:       Weight change:   Intake/Output Summary (Last 24 hours) at 10/04/2022 1031 Last data filed at 10/04/2022 1002 Gross per 24 hour  Intake 1343.33 ml  Output 900 ml  Net 443.33 ml    General: Alert, awake, oriented x3, in no acute distress.  HEENT: [place]/AT PEERL, EOMI Neck: Trachea midline,  no masses, no thyromegal,y no JVD, no carotid bruit OROPHARYNX:  Moist, No exudate/ erythema/lesions.  Heart: Regular rate and rhythm, without murmurs, rubs, gallops, PMI non-displaced, no heaves or thrills on palpation.  Lungs: Clear to auscultation, no wheezing or rhonchi noted. No increased vocal fremitus resonant to percussion  Abdomen: Soft, nontender, nondistended, positive bowel sounds, no masses no hepatosplenomegaly noted..  Neuro: No focal neurological deficits noted cranial nerves II through XII grossly intact. DTRs 2+ bilaterally upper and lower extremities. Strength 5 out of 5 in bilateral upper and lower extremities. Musculoskeletal: No warm swelling or erythema around joints, no spinal tenderness noted. Psychiatric: Patient alert and oriented x3, good insight and cognition, good recent to remote recall. Lymph node survey: No cervical axillary or inguinal lymphadenopathy noted.   Data Reviewed: Basic Metabolic Panel: Recent Labs  Lab  10/01/22 0748 10/03/22 1100 10/03/22 1825 10/04/22 0456  NA 136 135  --  134*  K 3.9 4.4  --  3.6  CL 100 105  --  101  CO2 25 24  --  26  GLUCOSE 124* 100*  --  105*  BUN 12 11  --  10  CREATININE 0.95 0.83 0.91 0.97  CALCIUM 9.7 9.0  --  8.7*   Liver Function Tests: Recent Labs  Lab 10/01/22 0748 10/03/22 1100 10/04/22 0456  AST 12* 9* 9*  ALT '17 14 13  '$ ALKPHOS 62 49 43  BILITOT 2.1* 1.7* 1.7*  PROT 8.9* 7.8 6.9  ALBUMIN 4.9 4.6  4.0   No results for input(s): "LIPASE", "AMYLASE" in the last 168 hours. No results for input(s): "AMMONIA" in the last 168 hours. CBC: Recent Labs  Lab 10/01/22 0748 10/02/22 0730 10/03/22 1100 10/03/22 1825 10/04/22 0456  WBC 10.8* 7.9 11.6* 10.8* 7.6  NEUTROABS 7.5  --  8.9*  --  4.0  HGB 12.3* 11.1* 11.0* 11.9* 10.9*  HCT 34.5* 30.8* 31.1* 33.5* 30.1*  MCV 78.6* 78.0* 77.8* 77.5* 76.2*  PLT 120* 135* 112* 125* 113*   Cardiac Enzymes: No results for input(s): "CKTOTAL", "CKMB", "CKMBINDEX", "TROPONINI" in the last 168 hours. BNP (last 3 results) No results for input(s): "BNP" in the last 8760 hours.  ProBNP (last 3 results) No results for input(s): "PROBNP" in the last 8760 hours.  CBG: No results for input(s): "GLUCAP" in the last 168 hours.  Recent Results (from the past 240 hour(s))  Resp panel by RT-PCR (RSV, Flu A&B, Covid) Anterior Nasal Swab     Status: None   Collection Time: 10/01/22  7:37 AM   Specimen: Anterior Nasal Swab  Result Value Ref Range Status   SARS Coronavirus 2 by RT PCR NEGATIVE NEGATIVE Final    Comment: (NOTE) SARS-CoV-2 target nucleic acids are NOT DETECTED.  The SARS-CoV-2 RNA is generally detectable in upper respiratory specimens during the acute phase of infection. The lowest concentration of SARS-CoV-2 viral copies this assay can detect is 138 copies/mL. A negative result does not preclude SARS-Cov-2 infection and should not be used as the sole basis for treatment or other patient management decisions. A negative result may occur with  improper specimen collection/handling, submission of specimen other than nasopharyngeal swab, presence of viral mutation(s) within the areas targeted by this assay, and inadequate number of viral copies(<138 copies/mL). A negative result must be combined with clinical observations, patient history, and epidemiological information. The expected result is Negative.  Fact Sheet for Patients:   EntrepreneurPulse.com.au  Fact Sheet for Healthcare Providers:  IncredibleEmployment.be  This test is no t yet approved or cleared by the [place] FDA and  has been authorized for detection and/or diagnosis of SARS-CoV-2 by FDA under an Emergency Use Authorization (EUA). This EUA will remain  in effect (meaning this test can be used) for the duration of the COVID-19 declaration under Section 564(b)(1) of the Act, 21 U.S.C.section 360bbb-3(b)(1), unless the authorization is terminated  or revoked sooner.       Influenza A by PCR NEGATIVE NEGATIVE Final   Influenza B by PCR NEGATIVE NEGATIVE Final    Comment: (NOTE) The Xpert Xpress SARS-CoV-2/FLU/RSV plus assay is intended as an aid in the diagnosis of influenza from Nasopharyngeal swab specimens and should not be used as a sole basis for treatment. Nasal washings and aspirates are unacceptable for Xpert Xpress SARS-CoV-2/FLU/RSV testing.  Fact Sheet for Patients: EntrepreneurPulse.com.au  Fact Sheet for Healthcare Providers: IncredibleEmployment.be  This test is not yet approved or cleared by the [place] FDA and has been authorized for detection and/or diagnosis of SARS-CoV-2 by FDA under an Emergency Use Authorization (EUA). This EUA will remain in effect (meaning this test can be used) for the duration of the COVID-19 declaration under Section 564(b)(1) of the Act, 21 U.S.C. section 360bbb-3(b)(1), unless the authorization is terminated or revoked.     Resp Syncytial Virus by PCR NEGATIVE NEGATIVE Final    Comment: (NOTE) Fact Sheet for Patients: EntrepreneurPulse.com.au  Fact Sheet for Healthcare Providers: IncredibleEmployment.be  This test is not yet approved or cleared by the [place] FDA and has been authorized for detection and/or diagnosis of SARS-CoV-2 by FDA under an Emergency Use  Authorization (EUA). This EUA will remain in effect (meaning this test can be used) for the duration of the COVID-19 declaration under Section 564(b)(1) of the Act, 21 U.S.C. section 360bbb-3(b)(1), unless the authorization is terminated or revoked.  Performed at [facility], [place] [address] [place],  [address]      Studies: DG Chest 2 View  Result Date: 10/01/2022 CLINICAL DATA:  History of sickle cell anemia with cough and worsening body pain EXAM: CHEST - 2 VIEW COMPARISON:  Chest radiograph dated 09/26/2022 FINDINGS: Normal lung volumes. No focal consolidations. No pleural effusion or pneumothorax. Similar cardiomediastinal silhouette. Mild chronic osseous changes of sickle cell anemia. IMPRESSION: No focal consolidations. Electronically Signed   By: Darrin Nipper M.D.   On: 10/01/2022 08:13   DG Chest 2 View  Result Date: 09/26/2022 CLINICAL DATA:  Cough with nausea.  Sickle cell crisis. EXAM: CHEST - 2 VIEW COMPARISON:  02/03/2022 FINDINGS: The lungs are clear without focal pneumonia, edema, pneumothorax or pleural effusion. The cardiopericardial silhouette is within normal limits for size. The visualized bony structures of the thorax are unremarkable. Telemetry leads overlie the chest. IMPRESSION: No active cardiopulmonary disease. Electronically Signed   By: Misty Stanley M.D.   On: 09/26/2022 10:09    Scheduled Meds:  enoxaparin (LOVENOX) injection  40 mg Subcutaneous [id number]   folic acid  1 mg Oral Daily   HYDROmorphone   Intravenous Q4H   ketorolac  15 mg Intravenous Q6H   promethazine  25 mg Oral Once   senna-docusate  1 tablet Oral BID   Continuous Infusions:  dextrose 5 % and 0.45% NaCl 125 mL/hr at 10/04/22 0947    Principal Problem:   Sickle cell disease with crisis [facility]) Active Problems:   Acute sickle cell crisis (HCC)   Dehydration   Anemia of chronic disease

## 2022-10-05 NOTE — Progress Notes (Signed)
Patient discharged to home with family, discharge instructions reviewed with patient who verbalized understanding. 

## 2022-10-05 NOTE — Discharge Summary (Signed)
Physician Discharge Summary   Patient: Austin [name] MRN: [id number] DOB: 1990-07-30  Admit date:     10/03/2022  Discharge date: 10/05/2022  Discharge Physician: Barbette Merino   PCP: Dorena Dew, FNP   Recommendations at discharge:   Patient insist on discharging home.  He wants to go to his job.  Discussed with him and wife and she will not start aggressive work until he is better.  Home regimen to continue and follow-up with PCP for clearance.  Discharge Diagnoses: Principal Problem:   Sickle cell disease with crisis ([facility]) Active Problems:   Acute sickle cell crisis ([place])   Dehydration   Anemia of chronic disease  Resolved Problems:   * No resolved hospital problems. [facility] Course: Patient with history of sickle cell disease, multiple medical problem who was admitted earlier in the week and discharged home after a day.  Patient wanted to go home apparently he wanted to go home today go to work.  He overexerted himself and came back tonight with more pain.  He was unable to function.  Patient readmitted with sickle cell crisis.  He initially wanted to be transferred to [facility].  Later he was insistent on trying to leave the hospital because of work-related issues.  He was subsequently convinced he stayed in the hospital to get care.  Assessment and Plan: No notes have been filed under this hospital service. Service: Hospitalist        Consultants: None Procedures performed: None Disposition: Home Diet recommendation:  Discharge Diet Orders (From admission, onward)     Start     Ordered   10/05/22 0000  Diet - low sodium heart healthy        10/05/22 0736           Regular diet DISCHARGE MEDICATION: Allergies as of 10/05/2022   No Known Allergies      Medication List     STOP taking these medications    LORazepam 0.5 MG tablet Commonly known as: ATIVAN       TAKE these medications    dicyclomine 20 MG tablet Commonly known  as: BENTYL TAKE 1 TABLET BY MOUTH TWICE A DAY What changed:  when to take this reasons to take this   folic acid 1 MG tablet Commonly known as: FOLVITE Take 1 tablet (1 mg total) by mouth daily.   loratadine 10 MG tablet Commonly known as: CLARITIN Take 1 tablet (10 mg total) by mouth daily. What changed:  when to take this reasons to take this   naproxen 500 MG tablet Commonly known as: Naprosyn Take 1 tablet (500 mg total) by mouth 2 (two) times daily with a meal. What changed:  when to take this reasons to take this   ondansetron 4 MG disintegrating tablet Commonly known as: ZOFRAN-ODT Take 1 tablet (4 mg total) by mouth every 8 (eight) hours as needed for nausea or vomiting.   Oxycodone HCl 10 MG Tabs Take 1 tablet (10 mg total) by mouth every 6 (six) hours as needed. What changed: reasons to take this   triamcinolone 55 MCG/ACT Aero nasal inhaler Commonly known as: NASACORT Place 2 sprays into the nose daily. 2 sprays each nostril at night   Vitamin D (Ergocalciferol) 1.25 MG (50000 UNIT) Caps capsule Commonly known as: DRISDOL TAKE 1 CAPSULE (50,000 UNITS TOTAL) BY MOUTH ONCE A WEEK. X 12 WEEKS. What changed: See the new instructions.        Discharge Exam: [employer]  10/03/22 1015 10/03/22 1805 10/05/22 0547  Weight: 79 kg 78.9 kg 76.9 kg   Constitutional: NAD, calm, comfortable Eyes: PERRL, lids and conjunctivae normal ENMT: Mucous membranes are moist. Posterior pharynx clear of any exudate or lesions.Normal dentition.  Neck: normal, supple, no masses, no thyromegaly Respiratory: clear to auscultation bilaterally, no wheezing, no crackles. Normal respiratory effort. No accessory muscle use.  Cardiovascular: Regular rate and rhythm, no murmurs / rubs / gallops. No extremity edema. 2+ pedal pulses. No carotid bruits.  Abdomen: no tenderness, no masses palpated. No hepatosplenomegaly. Bowel sounds positive.  Musculoskeletal: Good range of motion, no  joint swelling or tenderness, Skin: no rashes, lesions, ulcers. No induration Neurologic: CN 2-12 grossly intact. Sensation intact, DTR normal. Strength 5/5 in all 4.  Psychiatric: Normal judgment and insight. Alert and oriented x 3. Normal mood   Condition at discharge: good  The results of significant diagnostics from this hospitalization (including imaging, microbiology, ancillary and laboratory) are listed below for reference.   Imaging Studies: DG Chest 2 View  Result Date: 10/01/2022 CLINICAL DATA:  History of sickle cell anemia with cough and worsening body pain EXAM: CHEST - 2 VIEW COMPARISON:  Chest radiograph dated 09/26/2022 FINDINGS: Normal lung volumes. No focal consolidations. No pleural effusion or pneumothorax. Similar cardiomediastinal silhouette. Mild chronic osseous changes of sickle cell anemia. IMPRESSION: No focal consolidations. Electronically Signed   By: Darrin Nipper M.D.   On: 10/01/2022 08:13   DG Chest 2 View  Result Date: 09/26/2022 CLINICAL DATA:  Cough with nausea.  Sickle cell crisis. EXAM: CHEST - 2 VIEW COMPARISON:  02/03/2022 FINDINGS: The lungs are clear without focal pneumonia, edema, pneumothorax or pleural effusion. The cardiopericardial silhouette is within normal limits for size. The visualized bony structures of the thorax are unremarkable. Telemetry leads overlie the chest. IMPRESSION: No active cardiopulmonary disease. Electronically Signed   By: Misty Stanley M.D.   On: 09/26/2022 10:09    Microbiology: Results for orders placed or performed during the hospital encounter of 10/01/22  Resp panel by RT-PCR (RSV, Flu A&B, Covid) Anterior Nasal Swab     Status: None   Collection Time: 10/01/22  7:37 AM   Specimen: Anterior Nasal Swab  Result Value Ref Range Status   SARS Coronavirus 2 by RT PCR NEGATIVE NEGATIVE Final    Comment: (NOTE) SARS-CoV-2 target nucleic acids are NOT DETECTED.  The SARS-CoV-2 RNA is generally detectable in upper  respiratory specimens during the acute phase of infection. The lowest concentration of SARS-CoV-2 viral copies this assay can detect is 138 copies/mL. A negative result does not preclude SARS-Cov-2 infection and should not be used as the sole basis for treatment or other patient management decisions. A negative result may occur with  improper specimen collection/handling, submission of specimen other than nasopharyngeal swab, presence of viral mutation(s) within the areas targeted by this assay, and inadequate number of viral copies(<138 copies/mL). A negative result must be combined with clinical observations, patient history, and epidemiological information. The expected result is Negative.  Fact Sheet for Patients:  EntrepreneurPulse.com.au  Fact Sheet for Healthcare Providers:  IncredibleEmployment.be  This test is no t yet approved or cleared by the [place] FDA and  has been authorized for detection and/or diagnosis of SARS-CoV-2 by FDA under an Emergency Use Authorization (EUA). This EUA will remain  in effect (meaning this test can be used) for the duration of the COVID-19 declaration under Section 564(b)(1) of the Act, 21 U.S.C.section 360bbb-3(b)(1), unless the authorization is terminated  or revoked sooner.       Influenza A by PCR NEGATIVE NEGATIVE Final   Influenza B by PCR NEGATIVE NEGATIVE Final    Comment: (NOTE) The Xpert Xpress SARS-CoV-2/FLU/RSV plus assay is intended as an aid in the diagnosis of influenza from Nasopharyngeal swab specimens and should not be used as a sole basis for treatment. Nasal washings and aspirates are unacceptable for Xpert Xpress SARS-CoV-2/FLU/RSV testing.  Fact Sheet for Patients: EntrepreneurPulse.com.au  Fact Sheet for Healthcare Providers: IncredibleEmployment.be  This test is not yet approved or cleared by the [place] FDA and has been  authorized for detection and/or diagnosis of SARS-CoV-2 by FDA under an Emergency Use Authorization (EUA). This EUA will remain in effect (meaning this test can be used) for the duration of the COVID-19 declaration under Section 564(b)(1) of the Act, 21 U.S.C. section 360bbb-3(b)(1), unless the authorization is terminated or revoked.     Resp Syncytial Virus by PCR NEGATIVE NEGATIVE Final    Comment: (NOTE) Fact Sheet for Patients: EntrepreneurPulse.com.au  Fact Sheet for Healthcare Providers: IncredibleEmployment.be  This test is not yet approved or cleared by the [place] FDA and has been authorized for detection and/or diagnosis of SARS-CoV-2 by FDA under an Emergency Use Authorization (EUA). This EUA will remain in effect (meaning this test can be used) for the duration of the COVID-19 declaration under Section 564(b)(1) of the Act, 21 U.S.C. section 360bbb-3(b)(1), unless the authorization is terminated or revoked.  Performed at [facility], [place] [address], [place], [place] [address]     Labs: CBC: Recent Labs  Lab 10/01/22 0748 10/02/22 0730 10/03/22 1100 10/03/22 1825 10/04/22 0456  WBC 10.8* 7.9 11.6* 10.8* 7.6  NEUTROABS 7.5  --  8.9*  --  4.0  HGB 12.3* 11.1* 11.0* 11.9* 10.9*  HCT 34.5* 30.8* 31.1* 33.5* 30.1*  MCV 78.6* 78.0* 77.8* 77.5* 76.2*  PLT 120* 135* 112* 125* [id number]*   Basic Metabolic Panel: Recent Labs  Lab 10/01/22 0748 10/03/22 1100 10/03/22 1825 10/04/22 0456  NA 136 135  --  134*  K 3.9 4.4  --  3.6  CL 100 105  --  101  CO2 25 24  --  26  GLUCOSE 124* 100*  --  105*  BUN 12 11  --  10  CREATININE 0.95 0.83 0.91 0.97  CALCIUM 9.7 9.0  --  8.7*   Liver Function Tests: Recent Labs  Lab 10/01/22 0748 10/03/22 1100 10/04/22 0456  AST 12* 9* 9*  ALT '17 14 13  '$ ALKPHOS 62 49 43  BILITOT 2.1* 1.7* 1.7*  PROT 8.9* 7.8 6.9  ALBUMIN 4.9 4.6 4.0   CBG: No results for  input(s): "GLUCAP" in the last 168 hours.  Discharge time spent: less than 30 minutes.  SignedBarbette Merino, MD [facility] 10/05/2022

## 2022-10-05 NOTE — Hospital Course (Signed)
Patient with history of sickle cell disease, multiple medical problem who was admitted earlier in the week and discharged home after a day.  Patient wanted to go home apparently he wanted to go home today go to work.  He overexerted himself and came back tonight with more pain.  He was unable to function.  Patient readmitted with sickle cell crisis.  He initially wanted to be transferred to [facility].  Later he was insistent on trying to leave the hospital because of work-related issues.  He was subsequently convinced he stayed in the hospital to get care.

## 2022-10-06 ENCOUNTER — Telehealth: Payer: Self-pay

## 2022-10-06 NOTE — Transitions of Care (Post Inpatient/ED Visit) (Signed)
   10/06/2022  Name: COSTON MANDATO MRN: [id number] DOB: 07/16/1991  Today's TOC FU Call Status: Today's TOC FU Call Status:: Successful TOC FU Call Competed TOC FU Call Complete Date: 10/06/22  Transition Care Management Follow-up Telephone Call Date of Discharge: 10/05/22 Discharge Facility: [facility]) Type of Discharge: Inpatient Admission Primary Inpatient Discharge Diagnosis:: Sickle cell disease with crisis How have you been since you were released from the hospital?: Worse Any questions or concerns?: Yes Patient Questions/Concerns:: patient had night sweats.. nausea this morning - on way to [facility] now to be evaluated  Items Reviewed: Did you receive and understand the discharge instructions provided?: Yes Medications obtained and verified?: Yes (Medications Reviewed) Any new allergies since your discharge?: No Dietary orders reviewed?: Yes Do you have support at home?: Yes  Home Care and Equipment/Supplies: [place] Ordered?: No Any new equipment or medical supplies ordered?: No  Functional Questionnaire: Do you need assistance with bathing/showering or dressing?: No Do you need assistance with meal preparation?: No Do you need assistance with eating?: No Do you have difficulty maintaining continence: No Do you need assistance with getting out of bed/getting out of a chair/moving?: No Do you have difficulty managing or taking your medications?: No  Folllow up appointments reviewed: PCP Follow-up appointment confirmed?: No MD Provider Line Number:[phone] Given: Yes [facility] Follow-up appointment confirmed?: NA Do you need transportation to your follow-up appointment?: No Do you understand care options if your condition(s) worsen?: Yes-patient verbalized understanding    [place] LPN [facility] Direct Dial [phone]

## 2022-10-08 ENCOUNTER — Inpatient Hospital Stay (HOSPITAL_COMMUNITY)
Admission: EM | Admit: 2022-10-08 | Discharge: 2022-10-09 | DRG: 812 | Disposition: A | Discharge status: Home / Self Care | Payer: 59 | Attending: Internal Medicine | Admitting: Internal Medicine

## 2022-10-08 ENCOUNTER — Encounter (HOSPITAL_COMMUNITY): Payer: Self-pay

## 2022-10-08 ENCOUNTER — Other Ambulatory Visit: Payer: Self-pay

## 2022-10-08 DIAGNOSIS — R197 Diarrhea, unspecified: Secondary | ICD-10-CM | POA: Diagnosis present

## 2022-10-08 DIAGNOSIS — Z87891 Personal history of nicotine dependence: Secondary | ICD-10-CM | POA: Diagnosis not present

## 2022-10-08 DIAGNOSIS — D5700 Hb-SS disease with crisis, unspecified: Secondary | ICD-10-CM | POA: Diagnosis present

## 2022-10-08 DIAGNOSIS — G894 Chronic pain syndrome: Secondary | ICD-10-CM | POA: Diagnosis present

## 2022-10-08 DIAGNOSIS — Z79899 Other long term (current) drug therapy: Secondary | ICD-10-CM

## 2022-10-08 DIAGNOSIS — F1120 Opioid dependence, uncomplicated: Secondary | ICD-10-CM | POA: Diagnosis present

## 2022-10-08 DIAGNOSIS — Z8249 Family history of ischemic heart disease and other diseases of the circulatory system: Secondary | ICD-10-CM | POA: Diagnosis not present

## 2022-10-08 DIAGNOSIS — Z833 Family history of diabetes mellitus: Secondary | ICD-10-CM | POA: Diagnosis not present

## 2022-10-08 DIAGNOSIS — R739 Hyperglycemia, unspecified: Secondary | ICD-10-CM | POA: Diagnosis present

## 2022-10-08 DIAGNOSIS — D72829 Elevated white blood cell count, unspecified: Secondary | ICD-10-CM | POA: Diagnosis present

## 2022-10-08 DIAGNOSIS — D638 Anemia in other chronic diseases classified elsewhere: Secondary | ICD-10-CM | POA: Diagnosis present

## 2022-10-08 DIAGNOSIS — D696 Thrombocytopenia, unspecified: Secondary | ICD-10-CM | POA: Diagnosis present

## 2022-10-08 DIAGNOSIS — R112 Nausea with vomiting, unspecified: Secondary | ICD-10-CM | POA: Diagnosis present

## 2022-10-08 DIAGNOSIS — Z832 Family history of diseases of the blood and blood-forming organs and certain disorders involving the immune mechanism: Secondary | ICD-10-CM

## 2022-10-08 LAB — COMPREHENSIVE METABOLIC PANEL
ALT: 20 U/L (ref 0–44)
AST: 11 U/L — ABNORMAL LOW (ref 15–41)
Albumin: 5.4 g/dL — ABNORMAL HIGH (ref 3.5–5.0)
Alkaline Phosphatase: 53 U/L (ref 38–126)
Anion gap: 8 (ref 5–15)
BUN: 16 mg/dL (ref 6–20)
CO2: 27 mmol/L (ref 22–32)
Calcium: 9.8 mg/dL (ref 8.9–10.3)
Chloride: 102 mmol/L (ref 98–111)
Creatinine, Ser: 1.01 mg/dL (ref 0.61–1.24)
GFR, Estimated: >60 mL/min (ref >60)
Glucose, Bld: 147 mg/dL — ABNORMAL HIGH (ref 70–99)
Potassium: 4.1 mmol/L (ref 3.5–5.1)
Sodium: 137 mmol/L (ref 135–145)
Total Bilirubin: 1.9 mg/dL — ABNORMAL HIGH (ref 0.3–1.2)
Total Protein: 9 g/dL — ABNORMAL HIGH (ref 6.5–8.1)

## 2022-10-08 LAB — CBC WITH DIFFERENTIAL/PLATELET
Abs Immature Granulocytes: 0.06 10*3/uL (ref 0.00–0.07)
Basophils Absolute: 0 10*3/uL (ref 0.0–0.1)
Basophils Relative: 0 %
Eosinophils Absolute: 0 10*3/uL (ref 0.0–0.5)
Eosinophils Relative: 0 %
HCT: 36.7 % — ABNORMAL LOW (ref 39.0–52.0)
Hemoglobin: 12.9 g/dL — ABNORMAL LOW (ref 13.0–17.0)
Immature Granulocytes: 1 %
Lymphocytes Relative: 12 %
Lymphs Abs: 1.5 10*3/uL (ref 0.7–4.0)
MCH: 27.2 pg (ref 26.0–34.0)
MCHC: 35.1 g/dL (ref 30.0–36.0)
MCV: 77.3 fL — ABNORMAL LOW (ref 80.0–100.0)
Monocytes Absolute: 0.5 10*3/uL (ref 0.1–1.0)
Monocytes Relative: 4 %
Neutro Abs: 10.1 10*3/uL — ABNORMAL HIGH (ref 1.7–7.7)
Neutrophils Relative %: 83 %
Platelets: 148 10*3/uL — ABNORMAL LOW (ref 150–400)
RBC: 4.75 MIL/uL (ref 4.22–5.81)
RDW: 15 % (ref 11.5–15.5)
WBC: 12.1 10*3/uL — ABNORMAL HIGH (ref 4.0–10.5)
nRBC: 0.2 % (ref 0.0–0.2)

## 2022-10-08 LAB — RETICULOCYTES
Immature Retic Fract: 33.9 % — ABNORMAL HIGH (ref 2.3–15.9)
RBC.: 4.78 MIL/uL (ref 4.22–5.81)
Retic Count, Absolute: 244.7 10*3/uL — ABNORMAL HIGH (ref 19.0–186.0)
Retic Ct Pct: 5.1 % — ABNORMAL HIGH (ref 0.4–3.1)

## 2022-10-08 MED ORDER — SODIUM CHLORIDE 0.45 % IV SOLN: INTRAVENOUS | Status: DC

## 2022-10-08 MED ORDER — SENNOSIDES-DOCUSATE SODIUM 8.6-50 MG PO TABS
1.0000 | ORAL_TABLET | Freq: Two times a day (BID) | ORAL | Status: DC
  Administered 2022-10-08 – 2022-10-09 (×2): 1 via ORAL
  Filled 2022-10-08 (×2): qty 1

## 2022-10-08 MED ORDER — HYDROMORPHONE HCL 2 MG/ML IJ SOLN
2.0000 mg | INTRAMUSCULAR | Status: AC
  Administered 2022-10-08: 2 mg via INTRAVENOUS

## 2022-10-08 MED ORDER — HYDROMORPHONE HCL 2 MG/ML IJ SOLN
2.0000 mg | INTRAMUSCULAR | Status: DC
  Filled 2022-10-08: qty 1

## 2022-10-08 MED ORDER — HYDROMORPHONE HCL 2 MG/ML IJ SOLN
2.0000 mg | INTRAMUSCULAR | Status: AC
  Administered 2022-10-08: 2 mg via INTRAVENOUS
  Filled 2022-10-08: qty 1

## 2022-10-08 MED ORDER — KETOROLAC TROMETHAMINE 15 MG/ML IJ SOLN
15.0000 mg | Freq: Four times a day (QID) | INTRAMUSCULAR | Status: DC
  Administered 2022-10-08 – 2022-10-09 (×3): 15 mg via INTRAVENOUS
  Filled 2022-10-08 (×3): qty 1

## 2022-10-08 MED ORDER — HYDROMORPHONE HCL 1 MG/ML IJ SOLN
0.5000 mg | INTRAMUSCULAR | Status: AC
  Administered 2022-10-08: 0.5 mg via SUBCUTANEOUS
  Filled 2022-10-08: qty 1

## 2022-10-08 MED ORDER — ONDANSETRON HCL 4 MG/2ML IJ SOLN: 4.0000 mg | INTRAMUSCULAR | Status: DC | PRN

## 2022-10-08 MED ORDER — SODIUM CHLORIDE 0.9% FLUSH: 9.0000 mL | INTRAVENOUS | Status: DC | PRN

## 2022-10-08 MED ORDER — HYDROMORPHONE 1 MG/ML IV SOLN
INTRAVENOUS | Status: DC
  Administered 2022-10-08 (×2): 4.5 mg via INTRAVENOUS
  Administered 2022-10-08: 30 mg via INTRAVENOUS
  Administered 2022-10-09: 4 mg via INTRAVENOUS
  Administered 2022-10-09: 1.29 mg via INTRAVENOUS
  Filled 2022-10-08: qty 30

## 2022-10-08 MED ORDER — SODIUM CHLORIDE 0.45 % IV SOLN
INTRAVENOUS | Status: DC
  Administered 2022-10-08: 125 mL/h via INTRAVENOUS

## 2022-10-08 MED ORDER — OXYCODONE HCL 5 MG PO TABS: 10.0000 mg | ORAL_TABLET | Freq: Four times a day (QID) | ORAL | Status: DC | PRN

## 2022-10-08 MED ORDER — ONDANSETRON 4 MG PO TBDP
4.0000 mg | ORAL_TABLET | Freq: Once | ORAL | Status: AC
  Administered 2022-10-08: 4 mg via ORAL
  Filled 2022-10-08: qty 1

## 2022-10-08 MED ORDER — ENOXAPARIN SODIUM 40 MG/0.4ML IJ SOSY
40.0000 mg | PREFILLED_SYRINGE | INTRAMUSCULAR | Status: DC
  Filled 2022-10-08: qty 0.4

## 2022-10-08 MED ORDER — POLYETHYLENE GLYCOL 3350 17 G PO PACK: 17.0000 g | PACK | Freq: Every day | ORAL | Status: DC | PRN

## 2022-10-08 MED ORDER — HYDROMORPHONE HCL 1 MG/ML IJ SOLN
0.5000 mg | INTRAMUSCULAR | Status: DC
  Filled 2022-10-08: qty 1

## 2022-10-08 MED ORDER — SODIUM CHLORIDE 0.9 % IV SOLN
12.5000 mg | Freq: Once | INTRAVENOUS | Status: DC
  Filled 2022-10-08: qty 0.25

## 2022-10-08 MED ORDER — NALOXONE HCL 0.4 MG/ML IJ SOLN: 0.4000 mg | INTRAMUSCULAR | Status: DC | PRN

## 2022-10-08 MED ORDER — FOLIC ACID 1 MG PO TABS
1.0000 mg | ORAL_TABLET | Freq: Every day | ORAL | Status: DC
  Administered 2022-10-09: 1 mg via ORAL
  Filled 2022-10-08: qty 1

## 2022-10-08 MED ORDER — OXYCODONE HCL 5 MG PO TABS
10.0000 mg | ORAL_TABLET | Freq: Once | ORAL | Status: AC
  Administered 2022-10-08: 10 mg via ORAL
  Filled 2022-10-08: qty 2

## 2022-10-08 MED ORDER — ONDANSETRON HCL 4 MG/2ML IJ SOLN
4.0000 mg | Freq: Four times a day (QID) | INTRAMUSCULAR | Status: DC | PRN
  Administered 2022-10-08: 4 mg via INTRAVENOUS
  Filled 2022-10-08: qty 2

## 2022-10-08 MED ORDER — DIPHENHYDRAMINE HCL 25 MG PO CAPS: 25.0000 mg | ORAL_CAPSULE | ORAL | Status: DC | PRN

## 2022-10-08 NOTE — ED Triage Notes (Signed)
Pt BIB EMS from home with c/o sickle cell pain. N/V, pt states blood in emesis. Unable to take PO meds to relieve pain due to vomiting.  BP 150/90 HR 100 CBG 170

## 2022-10-08 NOTE — ED Provider Notes (Signed)
[facility] AT [facility] Provider Note   CSN: [id number] Arrival date & time: 10/08/22  [id number]     History {Add pertinent medical, surgical, social history, OB history to HPI:1} Chief Complaint  Patient presents with  . Sickle Cell Pain Crisis    Austin [name] is a 32 y.o. male.  HPI     Home Medications Prior to Admission medications   Medication Sig Start Date End Date Taking? Authorizing Provider  dicyclomine (BENTYL) 20 MG tablet TAKE 1 TABLET BY MOUTH TWICE A DAY Patient taking differently: Take 20 mg by mouth daily as needed for spasms. 03/25/22   Tresa Garter, MD  folic acid (FOLVITE) 1 MG tablet Take 1 tablet (1 mg total) by mouth daily. 08/12/22 08/12/23  Dorena Dew, FNP  loratadine (CLARITIN) 10 MG tablet Take 1 tablet (10 mg total) by mouth daily. Patient taking differently: Take 10 mg by mouth daily as needed for allergies or rhinitis. 11/13/20   Vevelyn Francois, NP  naproxen (NAPROSYN) 500 MG tablet Take 1 tablet (500 mg total) by mouth 2 (two) times daily with a meal. Patient taking differently: Take 500 mg by mouth 2 (two) times daily as needed for mild pain. 08/12/22   Dorena Dew, FNP  ondansetron (ZOFRAN-ODT) 4 MG disintegrating tablet Take 1 tablet (4 mg total) by mouth every 8 (eight) hours as needed for nausea or vomiting. 05/31/22   Dorena Dew, FNP  Oxycodone HCl 10 MG TABS Take 1 tablet (10 mg total) by mouth every 6 (six) hours as needed. Patient taking differently: Take 10 mg by mouth every 6 (six) hours as needed (For pain). 09/29/22   Dorena Dew, FNP  triamcinolone (NASACORT) 55 MCG/ACT AERO nasal inhaler Place 2 sprays into the nose daily. 2 sprays each nostril at night 04/27/21   Rozetta Nunnery, MD  Vitamin D, Ergocalciferol, (DRISDOL) 1.25 MG (50000 UNIT) CAPS capsule TAKE 1 CAPSULE (50,000 UNITS TOTAL) BY MOUTH ONCE A WEEK. X 12 WEEKS. Patient taking differently: Take 50,000 Units  by mouth every 7 (seven) days. 04/13/22   Fenton Foy, NP      Allergies    Patient has no known allergies.    Review of Systems   Review of Systems  Physical Exam Updated Vital Signs BP 125/86 (BP Location: Left Arm)   Pulse 84   Temp 97.8 F (36.6 C) (Oral)   Resp 17   Ht 5\' 8"  (1.727 m)   Wt 79.4 kg   SpO2 100%   BMI 26.61 kg/m  Physical Exam  ED Results / Procedures / Treatments   Labs (all labs ordered are listed, but only abnormal results are displayed) Labs Reviewed  COMPREHENSIVE METABOLIC PANEL  CBC WITH DIFFERENTIAL/PLATELET  RETICULOCYTES    EKG None  Radiology No results found.  Procedures Procedures  {Document cardiac monitor, telemetry assessment procedure when appropriate:1}  Medications Ordered in ED Medications  HYDROmorphone (DILAUDID) injection 0.5 mg (has no administration in time range)  oxyCODONE (Oxy IR/ROXICODONE) immediate release tablet 10 mg (has no administration in time range)  HYDROmorphone (DILAUDID) injection 0.5 mg (0.5 mg Subcutaneous Given 10/08/22 1113)  ondansetron (ZOFRAN-ODT) disintegrating tablet 4 mg (4 mg Oral Given 10/08/22 1116)    ED Course/ Medical Decision Making/ A&P   {   Click here for ABCD2, HEART and other calculatorsREFRESH Note before signing :1}  Medical Decision Making Amount and/or Complexity of Data Reviewed Labs: ordered.  Risk Prescription drug management.   ***  {Document critical care time when appropriate:1} {Document review of labs and clinical decision tools ie heart score, Chads2Vasc2 etc:1}  {Document your independent review of radiology images, and any outside records:1} {Document your discussion with family members, caretakers, and with consultants:1} {Document social determinants of health affecting pt's care:1} {Document your decision making why or why not admission, treatments were needed:1} Final Clinical Impression(s) / ED Diagnoses Final  diagnoses:  None    Rx / DC Orders ED Discharge Orders     None

## 2022-10-08 NOTE — H&P (Signed)
H&P  Patient Demographics:  Austin [name], is a 32 y.o. male  MRN: [id number]   DOB - 1990/07/30  Admit Date - 10/08/2022  Outpatient Primary MD for the patient is Austin Dew, FNP  Chief Complaint  Patient presents with   Sickle Cell Pain Crisis      HPI:   Austin [name]  is a 32 y.o. male with a medical history that is significant for sickle cell disease, chronic pain syndrome, opiate dependence and tolerance, and anemia of chronic disease, who presented to the emergency room today with major complaints of vomiting and generalized body pain especially in his lower back and lower extremities that is consistent with his typical sickle cell pain crisis.  This is his third admission in 1 week.  He was at [facility] yesterday for complaints of sickle cell related pain, he was treated and discharged, he said to go home, felt better but woke up this morning and started vomiting.  One of his emesis contained blood streaks and he got scared and came to the emergency room.  He also had 1 episode of diarrhea.  Vomiting and diarrhea has since stopped but he continues to have pain and some cramping in his lower extremities.  There is no joint swelling or redness.  He does not remember any inciting factor but he did endorse eating some chicken at [facility] yesterday.  He denies any fever.  He denies any cough, chest pain, shortness of breath or headache.  He denies any recent travels, sick contact, or known exposure to COVID-19.  ED course: Vital signs in the emergency room were essentially normal as follows: BP 125/86 (BP Location: Left Arm)  Pulse 84  Temp 97.8 F (36.6 C) (Oral)  Resp 17  Ht 5\' 8"  (1.727 m)  Wt 79.4 kg  SpO2 100%  BMI 26.61 kg/m.  His labs showed slightly elevated blood glucose of 147, and total bilirubin 1.9, otherwise completely unremarkable.  White cell count slightly elevated to 12.1, hemoglobin stable at baseline of 12.9 and mild thrombocytopenia of  148.  Reticulocyte count of 5.1%.  Patient received multiple doses of IV Dilaudid and IV fluid in the emergency room but his pain persists.  Patient will be admitted to the hospital for further evaluation and management of sickle cell pain crisis.   Review of systems:  In addition to the HPI above, patient reports No fever or chills No Headache, No changes with vision or hearing No problems swallowing food or liquids No chest pain, cough or shortness of breath No abdominal pain, No nausea or vomiting, Bowel movements are regular No blood in stool or urine No dysuria No new skin rashes or bruises No new joints pains-aches No new weakness, tingling, numbness in any extremity No recent weight gain or loss No polyuria, polydypsia or polyphagia No significant Mental Stressors  A full 10 point Review of Systems was done, except as stated above, all other Review of Systems were negative.  With Past History of the following :   Past Medical History:  Diagnosis Date   Acute maxillary sinusitis    Eczema    Sickle cell anemia ([place]) .      Past Surgical History:  Procedure Laterality Date   FOOT SURGERY Left 12/25/2020   WISDOM TOOTH EXTRACTION       Social History:   Social History   Tobacco Use   Smoking status: Former    Packs/day: .1    Types: Cigarettes  Quit date: 11/23/2013    Years since quitting: 8.8   Smokeless tobacco: Never  Substance Use Topics   Alcohol use: Yes    Alcohol/week: 1.0 standard drink of alcohol    Types: 1 Standard drinks or equivalent per week     Lives - At home   Family History :   Family History  Problem Relation Age of Onset   Sickle cell trait Mother    Diabetes Father    Hypertension Father      Home Medications:   Prior to Admission medications   Medication Sig Start Date End Date Taking? Authorizing Provider  dicyclomine (BENTYL) 20 MG tablet TAKE 1 TABLET BY MOUTH TWICE A DAY Patient taking differently: Take 20 mg by mouth  daily as needed for spasms. 03/25/22   Tresa Garter, MD  folic acid (FOLVITE) 1 MG tablet Take 1 tablet (1 mg total) by mouth daily. 08/12/22 08/12/23  Austin Dew, FNP  loratadine (CLARITIN) 10 MG tablet Take 1 tablet (10 mg total) by mouth daily. Patient taking differently: Take 10 mg by mouth daily as needed for allergies or rhinitis. 11/13/20   Vevelyn Francois, NP  naproxen (NAPROSYN) 500 MG tablet Take 1 tablet (500 mg total) by mouth 2 (two) times daily with a meal. Patient taking differently: Take 500 mg by mouth 2 (two) times daily as needed for mild pain. 08/12/22   Austin Dew, FNP  ondansetron (ZOFRAN-ODT) 4 MG disintegrating tablet Take 1 tablet (4 mg total) by mouth every 8 (eight) hours as needed for nausea or vomiting. 05/31/22   Austin Dew, FNP  Oxycodone HCl 10 MG TABS Take 1 tablet (10 mg total) by mouth every 6 (six) hours as needed. Patient taking differently: Take 10 mg by mouth every 6 (six) hours as needed (For pain). 09/29/22   Austin Dew, FNP  triamcinolone (NASACORT) 55 MCG/ACT AERO nasal inhaler Place 2 sprays into the nose daily. 2 sprays each nostril at night 04/27/21   Rozetta Nunnery, MD  Vitamin D, Ergocalciferol, (DRISDOL) 1.25 MG (50000 UNIT) CAPS capsule TAKE 1 CAPSULE (50,000 UNITS TOTAL) BY MOUTH ONCE A WEEK. X 12 WEEKS. Patient taking differently: Take 50,000 Units by mouth every 7 (seven) days. 04/13/22   Fenton Foy, NP     Allergies:   No Known Allergies   Physical Exam:   Vitals:   Vitals:   10/08/22 1300 10/08/22 1340  BP: 130/82   Pulse: 78   Resp: 18   Temp:  98.1 F (36.7 C)  SpO2: 97%     Physical Exam: Constitutional: Patient appears well-developed and well-nourished. Not in obvious distress. HENT: Normocephalic, atraumatic, External right and left ear normal. Oropharynx is clear and moist.  Eyes: Conjunctivae and EOM are normal. PERRLA, no scleral icterus. Neck: Normal ROM. Neck supple. No JVD. No  tracheal deviation. No thyromegaly. CVS: RRR, S1/S2 +, no murmurs, no gallops, no carotid bruit.  Pulmonary: Effort and breath sounds normal, no stridor, rhonchi, wheezes, rales.  Abdominal: Soft. BS +, no distension, tenderness, rebound or guarding.  Musculoskeletal: Normal range of motion. No edema and no tenderness.  Lymphadenopathy: No lymphadenopathy noted, cervical, inguinal or axillary Neuro: Alert. Normal reflexes, muscle tone coordination. No cranial nerve deficit. Skin: Skin is warm and dry. No rash noted. Not diaphoretic. No erythema. No pallor. Psychiatric: Normal mood and affect. Behavior, judgment, thought content normal.   Data Review:   CBC Recent Labs  Lab 10/02/22 0730 10/03/22  1100 10/03/22 1825 10/04/22 0456 10/08/22 1034  WBC 7.9 11.6* 10.8* 7.6 12.1*  HGB 11.1* 11.0* 11.9* 10.9* 12.9*  HCT 30.8* 31.1* 33.5* 30.1* 36.7*  PLT 135* 112* 125* 113* 148*  MCV 78.0* 77.8* 77.5* 76.2* 77.3*  MCH 28.1 27.5 27.5 27.6 27.2  MCHC 36.0 35.4 35.5 36.2* 35.1  RDW 14.5 14.6 14.6 14.6 15.0  LYMPHSABS  --  1.9  --  2.8 1.5  MONOABS  --  0.7  --  0.6 0.5  EOSABS  --  0.1  --  0.1 0.0  BASOSABS  --  0.0  --  0.0 0.0   ------------------------------------------------------------------------------------------------------------------  Chemistries  Recent Labs  Lab 10/03/22 1100 10/03/22 1825 10/04/22 0456 10/08/22 1034  NA 135  --  134* 137  K 4.4  --  3.6 4.1  CL 105  --  101 102  CO2 24  --  26 27  GLUCOSE 100*  --  105* 147*  BUN 11  --  10 16  CREATININE 0.83 0.91 0.97 1.01  CALCIUM 9.0  --  8.7* 9.8  AST 9*  --  9* 11*  ALT 14  --  13 20  ALKPHOS 49  --  43 53  BILITOT 1.7*  --  1.7* 1.9*   ------------------------------------------------------------------------------------------------------------------ estimated creatinine clearance is 101.6 mL/min (by C-G formula based on SCr of 1.01  mg/dL). ------------------------------------------------------------------------------------------------------------------ No results for input(s): "TSH", "T4TOTAL", "T3FREE", "THYROIDAB" in the last 72 hours.  Invalid input(s): "FREET3"  Coagulation profile No results for input(s): "INR", "PROTIME" in the last 168 hours. ------------------------------------------------------------------------------------------------------------------- No results for input(s): "DDIMER" in the last 72 hours. -------------------------------------------------------------------------------------------------------------------  Cardiac Enzymes No results for input(s): "CKMB", "TROPONINI", "MYOGLOBIN" in the last 168 hours.  Invalid input(s): "CK" ------------------------------------------------------------------------------------------------------------------ No results found for: "BNP"  ---------------------------------------------------------------------------------------------------------------  Urinalysis    Component Value Date/Time   COLORURINE YELLOW 10/14/2021 [place] 10/14/2021 1232   LABSPEC 1.042 (H) 10/14/2021 1232   PHURINE 7.0 10/14/2021 1232   GLUCOSEU NEGATIVE 10/14/2021 1232   HGBUR NEGATIVE 10/14/2021 1232   [place] 10/14/2021 1232   BILIRUBINUR negative 08/21/2021 1527   BILIRUBINUR negative 02/05/2020 0749   KETONESUR NEGATIVE 10/14/2021 1232   PROTEINUR NEGATIVE 10/14/2021 1232   UROBILINOGEN 2.0 (A) 08/21/2021 1527   UROBILINOGEN 1.0 10/18/2017 0841   NITRITE NEGATIVE 10/14/2021 1232   LEUKOCYTESUR NEGATIVE 10/14/2021 1232    ----------------------------------------------------------------------------------------------------------------   Imaging Results:    No results found.   Assessment & Plan:  Principal Problem:   Sickle cell anemia with pain (HCC) Active Problems:   Chronic pain syndrome   Anemia of chronic disease  Hb Sickle  Cell Disease with crisis: Admit patient, start IVF 0.45% Saline @ 125 mls/hour, start weight based Dilaudid PCA, start IV Toradol 15 mg Q 6 H, Restart oral home pain medications, Monitor vitals very closely, Re-evaluate pain scale regularly, 2 L of Oxygen by [place], Patient will be re-evaluated for pain in the context of function and relationship to baseline as care progresses. Leukocytosis: Very mild. And this is chronic. There is no sign of infection or inflammation at this time. Will continue to monitor without antibiotics. Anemia of Chronic Disease: Hemoglobin is stable at baseline today. There is no clinical indication for any blood transfusion at this time. Will continue to monitor very closely and transfuse as appropriate. Chronic pain Syndrome: Will restart and continue his home pain medications. Vomiting: Resolved. Patient on Zofran.  Will monitor very closely.  DVT Prophylaxis: Subcut  Lovenox   AM Labs Ordered, also please review Full Orders  Family Communication: Admission, patient's condition and plan of care including tests being ordered have been discussed with the patient who indicate understanding and agree with the plan and Code Status.  Code Status: Full Code  Consults called: None    Admission status: Inpatient    Time spent in minutes : 50 minutes  Angelica Chessman MD, MHA, CPE, FACP 10/08/2022 at 3:49 PM

## 2022-10-09 LAB — BASIC METABOLIC PANEL
Anion gap: 7 (ref 5–15)
BUN: 11 mg/dL (ref 6–20)
CO2: 27 mmol/L (ref 22–32)
Calcium: 8.9 mg/dL (ref 8.9–10.3)
Chloride: 98 mmol/L (ref 98–111)
Creatinine, Ser: 0.82 mg/dL (ref 0.61–1.24)
GFR, Estimated: >60 mL/min (ref >60)
Glucose, Bld: 116 mg/dL — ABNORMAL HIGH (ref 70–99)
Potassium: 3.3 mmol/L — ABNORMAL LOW (ref 3.5–5.1)
Sodium: 132 mmol/L — ABNORMAL LOW (ref 135–145)

## 2022-10-09 LAB — CBC
HCT: 29 % — ABNORMAL LOW (ref 39.0–52.0)
Hemoglobin: 10.3 g/dL — ABNORMAL LOW (ref 13.0–17.0)
MCH: 27.3 pg (ref 26.0–34.0)
MCHC: 35.5 g/dL (ref 30.0–36.0)
MCV: 76.9 fL — ABNORMAL LOW (ref 80.0–100.0)
Platelets: 120 10*3/uL — ABNORMAL LOW (ref 150–400)
RBC: 3.77 MIL/uL — ABNORMAL LOW (ref 4.22–5.81)
RDW: 14.7 % (ref 11.5–15.5)
WBC: 10.1 10*3/uL (ref 4.0–10.5)
nRBC: 0.2 % (ref 0.0–0.2)

## 2022-10-09 NOTE — Discharge Summary (Signed)
Physician Discharge Summary  Austin [name] [id number] DOB: Jan 23, 1991 DOA: 10/08/2022  PCP: Dorena Dew, FNP  Admit date: 10/08/2022  Discharge date: 10/09/2022  Discharge Diagnoses:  Principal Problem:   Sickle cell anemia with pain ([place]) Active Problems:   Chronic pain syndrome   Anemia of chronic disease   Discharge Condition: Stable  Disposition:   Follow-up Information     Dorena Dew, FNP. Call in 1 week(s).   Specialty: Family Medicine Contact information: [place]. [place] [address] [phone]                Pt is discharged home in good condition and is to follow up with Dorena Dew, FNP this week to have labs evaluated. Austin [name] is instructed to increase activity slowly and balance with rest for the next few days, and use prescribed medication to complete treatment of pain  Diet: Regular Wt Readings from Last 3 Encounters:  10/08/22 79.4 kg  10/05/22 76.9 kg  10/01/22 78.9 kg    History of present illness:  Austin [name]  is a 32 y.o. male with a medical history that is significant for sickle cell disease, chronic pain syndrome, opiate dependence and tolerance, and anemia of chronic disease, who presented to the emergency room today with major complaints of vomiting and generalized body pain especially in his lower back and lower extremities that is consistent with his typical sickle cell pain crisis.  This is his third admission in 1 week.  He was at [facility] yesterday for complaints of sickle cell related pain, he was treated and discharged, he said to go home, felt better but woke up this morning and started vomiting.  One of his emesis contained blood streaks and he got scared and came to the emergency room.  He also had 1 episode of diarrhea.  Vomiting and diarrhea has since stopped but he continues to have pain and some cramping in his lower extremities.  There is no joint swelling  or redness.  He does not remember any inciting factor but he did endorse eating some chicken at [facility] yesterday.  He denies any fever.  He denies any cough, chest pain, shortness of breath or headache.  He denies any recent travels, sick contact, or known exposure to COVID-19.   ED course: Vital signs in the emergency room were essentially normal as follows: BP 125/86 (BP Location: Left Arm)  Pulse 84  Temp 97.8 F (36.6 C) (Oral)  Resp 17  Ht 5\' 8"  (1.727 m)  Wt 79.4 kg  SpO2 100%  BMI 26.61 kg/m.  His labs showed slightly elevated blood glucose of 147, and total bilirubin 1.9, otherwise completely unremarkable.  White cell count slightly elevated to 12.1, hemoglobin stable at baseline of 12.9 and mild thrombocytopenia of 148.  Reticulocyte count of 5.1%.  Patient received multiple doses of IV Dilaudid and IV fluid in the emergency room but his pain persists.  Patient will be admitted to the hospital for further evaluation and management of sickle cell pain crisis.  Hospital Course:  Patient was admitted for sickle cell pain crisis and managed appropriately with IVF, IV Dilaudid via PCA and IV Toradol, as well as other adjunct therapies per sickle cell pain management protocols.  Patient stated for 1 night in the hospital, he said his pain is at 0 this morning.  He had 0 episode of vomiting throughout hospital stay, no abdominal pain or nausea.  He  said he feels much better and requested to be discharged this morning.  Of note, this has been his typical behavior in the past 2 weeks only to return to the hospital few days after leaving the hospital prematurely.  Patient was counseled extensively about staying to complete treatment, he insisted he wanted to go home. Patient was therefore discharged home today in a hemodynamically stable condition.   Omkar will follow-up with PCP within 1 week of this discharge. Karle was counseled extensively about nonpharmacologic means of pain management,  patient verbalized understanding and was appreciative of  the care received during this admission.   We discussed the need for good hydration, monitoring of hydration status, avoidance of heat, cold, stress, and infection triggers. We discussed the need to be adherent with taking Hydrea and other home medications. Patient was reminded of the need to seek medical attention immediately if any symptom of bleeding, anemia, or infection occurs.  Discharge Exam: Vitals:   10/09/22 0726 10/09/22 1043  BP:  125/80  Pulse:  84  Resp: 16 14  Temp:  98 F (36.7 C)  SpO2: 96% 99%   Vitals:   10/09/22 0358 10/09/22 0431 10/09/22 0726 10/09/22 1043  BP:  128/67  125/80  Pulse:  88  84  Resp: 15 16 16 14   Temp:  98.1 F (36.7 C)  98 F (36.7 C)  TempSrc:  Oral  Oral  SpO2: 99% 100% 96% 99%  Weight:      Height:        General appearance : Awake, alert, not in any distress. Speech Clear. Not toxic looking HEENT: Atraumatic and Normocephalic, pupils equally reactive to light and accomodation Neck: Supple, no JVD. No cervical lymphadenopathy.  Chest: Good air entry bilaterally, no added sounds  CVS: S1 S2 regular, no murmurs.  Abdomen: Bowel sounds present, Non tender and not distended with no gaurding, rigidity or rebound. Extremities: B/L Lower Ext shows no edema, both legs are warm to touch Neurology: Awake alert, and oriented X 3, CN II-XII intact, Non focal Skin: No Rash  Discharge Instructions  Discharge Instructions     Diet - low sodium heart healthy   Complete by: As directed    Increase activity slowly   Complete by: As directed       Allergies as of 10/09/2022   No Known Allergies      Medication List     TAKE these medications    dicyclomine 20 MG tablet Commonly known as: BENTYL TAKE 1 TABLET BY MOUTH TWICE A DAY What changed:  when to take this reasons to take this   folic acid 1 MG tablet Commonly known as: FOLVITE Take 1 tablet (1 mg total) by mouth  daily.   loratadine 10 MG tablet Commonly known as: CLARITIN Take 1 tablet (10 mg total) by mouth daily. What changed:  when to take this reasons to take this   naproxen 500 MG tablet Commonly known as: Naprosyn Take 1 tablet (500 mg total) by mouth 2 (two) times daily with a meal. What changed:  when to take this reasons to take this   ondansetron 4 MG disintegrating tablet Commonly known as: ZOFRAN-ODT Take 1 tablet (4 mg total) by mouth every 8 (eight) hours as needed for nausea or vomiting.   Oxycodone HCl 10 MG Tabs Take 1 tablet (10 mg total) by mouth every 6 (six) hours as needed. What changed: reasons to take this   triamcinolone 55 MCG/ACT Aero nasal inhaler Commonly  known as: NASACORT Place 2 sprays into the nose daily. 2 sprays each nostril at night   Vitamin D (Ergocalciferol) 1.25 MG (50000 UNIT) Caps capsule Commonly known as: DRISDOL TAKE 1 CAPSULE (50,000 UNITS TOTAL) BY MOUTH ONCE A WEEK. X 12 WEEKS. What changed: See the new instructions.        The results of significant diagnostics from this hospitalization (including imaging, microbiology, ancillary and laboratory) are listed below for reference.    Significant Diagnostic Studies: DG Chest 2 View  Result Date: 10/01/2022 CLINICAL DATA:  History of sickle cell anemia with cough and worsening body pain EXAM: CHEST - 2 VIEW COMPARISON:  Chest radiograph dated 09/26/2022 FINDINGS: Normal lung volumes. No focal consolidations. No pleural effusion or pneumothorax. Similar cardiomediastinal silhouette. Mild chronic osseous changes of sickle cell anemia. IMPRESSION: No focal consolidations. Electronically Signed   By: Darrin Nipper M.D.   On: 10/01/2022 08:13   DG Chest 2 View  Result Date: 09/26/2022 CLINICAL DATA:  Cough with nausea.  Sickle cell crisis. EXAM: CHEST - 2 VIEW COMPARISON:  02/03/2022 FINDINGS: The lungs are clear without focal pneumonia, edema, pneumothorax or pleural effusion. The  cardiopericardial silhouette is within normal limits for size. The visualized bony structures of the thorax are unremarkable. Telemetry leads overlie the chest. IMPRESSION: No active cardiopulmonary disease. Electronically Signed   By: Misty Stanley M.D.   On: 09/26/2022 10:09    Microbiology: Recent Results (from the past 240 hour(s))  Resp panel by RT-PCR (RSV, Flu A&B, Covid) Anterior Nasal Swab     Status: None   Collection Time: 10/01/22  7:37 AM   Specimen: Anterior Nasal Swab  Result Value Ref Range Status   SARS Coronavirus 2 by RT PCR NEGATIVE NEGATIVE Final    Comment: (NOTE) SARS-CoV-2 target nucleic acids are NOT DETECTED.  The SARS-CoV-2 RNA is generally detectable in upper respiratory specimens during the acute phase of infection. The lowest concentration of SARS-CoV-2 viral copies this assay can detect is 138 copies/mL. A negative result does not preclude SARS-Cov-2 infection and should not be used as the sole basis for treatment or other patient management decisions. A negative result may occur with  improper specimen collection/handling, submission of specimen other than nasopharyngeal swab, presence of viral mutation(s) within the areas targeted by this assay, and inadequate number of viral copies(<138 copies/mL). A negative result must be combined with clinical observations, patient history, and epidemiological information. The expected result is Negative.  Fact Sheet for Patients:  EntrepreneurPulse.com.au  Fact Sheet for Healthcare Providers:  IncredibleEmployment.be  This test is no t yet approved or cleared by the [place] FDA and  has been authorized for detection and/or diagnosis of SARS-CoV-2 by FDA under an Emergency Use Authorization (EUA). This EUA will remain  in effect (meaning this test can be used) for the duration of the COVID-19 declaration under Section 564(b)(1) of the Act, 21 U.S.C.section  360bbb-3(b)(1), unless the authorization is terminated  or revoked sooner.       Influenza A by PCR NEGATIVE NEGATIVE Final   Influenza B by PCR NEGATIVE NEGATIVE Final    Comment: (NOTE) The Xpert Xpress SARS-CoV-2/FLU/RSV plus assay is intended as an aid in the diagnosis of influenza from Nasopharyngeal swab specimens and should not be used as a sole basis for treatment. Nasal washings and aspirates are unacceptable for Xpert Xpress SARS-CoV-2/FLU/RSV testing.  Fact Sheet for Patients: EntrepreneurPulse.com.au  Fact Sheet for Healthcare Providers: IncredibleEmployment.be  This test is not yet approved or  cleared by the [place] and has been authorized for detection and/or diagnosis of SARS-CoV-2 by FDA under an Emergency Use Authorization (EUA). This EUA will remain in effect (meaning this test can be used) for the duration of the COVID-19 declaration under Section 564(b)(1) of the Act, 21 U.S.C. section 360bbb-3(b)(1), unless the authorization is terminated or revoked.     Resp Syncytial Virus by PCR NEGATIVE NEGATIVE Final    Comment: (NOTE) Fact Sheet for Patients: EntrepreneurPulse.com.au  Fact Sheet for Healthcare Providers: IncredibleEmployment.be  This test is not yet approved or cleared by the [place] FDA and has been authorized for detection and/or diagnosis of SARS-CoV-2 by FDA under an Emergency Use Authorization (EUA). This EUA will remain in effect (meaning this test can be used) for the duration of the COVID-19 declaration under Section 564(b)(1) of the Act, 21 U.S.C. section 360bbb-3(b)(1), unless the authorization is terminated or revoked.  Performed at [facility], [place] [address] [place], [place] [address]      Labs: Basic Metabolic Panel: Recent Labs  Lab 10/03/22 1100 10/03/22 1825 10/04/22 0456 10/08/22 1034 10/09/22 0613  NA 135   --  134* 137 132*  K 4.4  --  3.6 4.1 3.3*  CL 105  --  101 102 98  CO2 24  --  26 27 27   GLUCOSE 100*  --  105* 147* 116*  BUN 11  --  10 16 11   CREATININE 0.83 0.91 0.97 1.01 0.82  CALCIUM 9.0  --  8.7* 9.8 8.9   Liver Function Tests: Recent Labs  Lab 10/03/22 1100 10/04/22 0456 10/08/22 1034  AST 9* 9* 11*  ALT 14 13 20   ALKPHOS 49 43 53  BILITOT 1.7* 1.7* 1.9*  PROT 7.8 6.9 9.0*  ALBUMIN 4.6 4.0 5.4*   No results for input(s): "LIPASE", "AMYLASE" in the last 168 hours. No results for input(s): "AMMONIA" in the last 168 hours. CBC: Recent Labs  Lab 10/03/22 1100 10/03/22 1825 10/04/22 0456 10/08/22 1034 10/09/22 0613  WBC 11.6* 10.8* 7.6 12.1* 10.1  NEUTROABS 8.9*  --  4.0 10.1*  --   HGB 11.0* 11.9* 10.9* 12.9* 10.3*  HCT 31.1* 33.5* 30.1* 36.7* 29.0*  MCV 77.8* 77.5* 76.2* 77.3* 76.9*  PLT 112* 125* 113* 148* 120*   Cardiac Enzymes: No results for input(s): "CKTOTAL", "CKMB", "CKMBINDEX", "TROPONINI" in the last 168 hours. BNP: Invalid input(s): "POCBNP" CBG: No results for input(s): "GLUCAP" in the last 168 hours.  Time coordinating discharge: 50 minutes  Signed:  [facility] 10/09/2022, 11:39 AM

## 2022-10-11 ENCOUNTER — Telehealth: Payer: Self-pay

## 2022-10-11 NOTE — Transitions of Care (Post Inpatient/ED Visit) (Signed)
   10/11/2022  Name: Austin [name] MRN: [id number] DOB: Jan 20, 1991  Today's TOC FU Call Status: Today's TOC FU Call Status:: Successful TOC FU Call Competed TOC FU Call Complete Date: 10/11/22  Transition Care Management Follow-up Telephone Call Date of Discharge: 10/09/22 Discharge Facility: [facility]) Type of Discharge: Inpatient Admission Primary Inpatient Discharge Diagnosis:: Sickle cell anemia with pain How have you been since you were released from the hospital?: Better Any questions or concerns?: No  Items Reviewed: Did you receive and understand the discharge instructions provided?: Yes Medications obtained and verified?: Yes (Medications Reviewed) Any new allergies since your discharge?: No Dietary orders reviewed?: Yes Do you have support at home?: Yes  Home Care and Equipment/Supplies: [place] Ordered?: No Any new equipment or medical supplies ordered?: No  Functional Questionnaire: Do you need assistance with bathing/showering or dressing?: No Do you need assistance with meal preparation?: No Do you need assistance with eating?: No Do you have difficulty maintaining continence: No Do you need assistance with getting out of bed/getting out of a chair/moving?: No Do you have difficulty managing or taking your medications?: No  Folllow up appointments reviewed: PCP Follow-up appointment confirmed?: No (pt need to be seen by 10-23-22- no available appts- will ask front desk to call pt to make fu appt) MD Provider Line Number:[phone] Given: Yes [facility] Follow-up appointment confirmed?: No Do you need transportation to your follow-up appointment?: No Do you understand care options if your condition(s) worsen?: Yes-patient verbalized understanding    [place] LPN [place] [facility] [phone]

## 2022-10-14 ENCOUNTER — Inpatient Hospital Stay (HOSPITAL_COMMUNITY)
Admission: EM | Admit: 2022-10-14 | Discharge: 2022-10-15 | DRG: 812 | Disposition: A | Discharge status: Home / Self Care | Payer: 59 | Attending: Internal Medicine | Admitting: Internal Medicine

## 2022-10-14 ENCOUNTER — Emergency Department (HOSPITAL_COMMUNITY): Payer: 59

## 2022-10-14 ENCOUNTER — Encounter (HOSPITAL_COMMUNITY): Payer: Self-pay

## 2022-10-14 ENCOUNTER — Other Ambulatory Visit: Payer: Self-pay

## 2022-10-14 DIAGNOSIS — D696 Thrombocytopenia, unspecified: Secondary | ICD-10-CM | POA: Diagnosis present

## 2022-10-14 DIAGNOSIS — Z87891 Personal history of nicotine dependence: Secondary | ICD-10-CM | POA: Diagnosis not present

## 2022-10-14 DIAGNOSIS — Z8249 Family history of ischemic heart disease and other diseases of the circulatory system: Secondary | ICD-10-CM

## 2022-10-14 DIAGNOSIS — Z833 Family history of diabetes mellitus: Secondary | ICD-10-CM | POA: Diagnosis not present

## 2022-10-14 DIAGNOSIS — F1120 Opioid dependence, uncomplicated: Secondary | ICD-10-CM | POA: Diagnosis present

## 2022-10-14 DIAGNOSIS — D571 Sickle-cell disease without crisis: Secondary | ICD-10-CM

## 2022-10-14 DIAGNOSIS — G894 Chronic pain syndrome: Secondary | ICD-10-CM | POA: Diagnosis present

## 2022-10-14 DIAGNOSIS — Z832 Family history of diseases of the blood and blood-forming organs and certain disorders involving the immune mechanism: Secondary | ICD-10-CM

## 2022-10-14 DIAGNOSIS — D5700 Hb-SS disease with crisis, unspecified: Secondary | ICD-10-CM | POA: Diagnosis present

## 2022-10-14 DIAGNOSIS — R112 Nausea with vomiting, unspecified: Secondary | ICD-10-CM | POA: Diagnosis present

## 2022-10-14 DIAGNOSIS — D638 Anemia in other chronic diseases classified elsewhere: Secondary | ICD-10-CM | POA: Diagnosis present

## 2022-10-14 DIAGNOSIS — Z79899 Other long term (current) drug therapy: Secondary | ICD-10-CM | POA: Diagnosis not present

## 2022-10-14 LAB — COMPREHENSIVE METABOLIC PANEL
ALT: 14 U/L (ref 0–44)
AST: 10 U/L — ABNORMAL LOW (ref 15–41)
Albumin: 4.5 g/dL (ref 3.5–5.0)
Alkaline Phosphatase: 40 U/L (ref 38–126)
Anion gap: 9 (ref 5–15)
BUN: 13 mg/dL (ref 6–20)
CO2: 25 mmol/L (ref 22–32)
Calcium: 9.1 mg/dL (ref 8.9–10.3)
Chloride: 104 mmol/L (ref 98–111)
Creatinine, Ser: 0.79 mg/dL (ref 0.61–1.24)
GFR, Estimated: >60 mL/min (ref >60)
Glucose, Bld: 111 mg/dL — ABNORMAL HIGH (ref 70–99)
Potassium: 4.1 mmol/L (ref 3.5–5.1)
Sodium: 138 mmol/L (ref 135–145)
Total Bilirubin: 1.3 mg/dL — ABNORMAL HIGH (ref 0.3–1.2)
Total Protein: 7.7 g/dL (ref 6.5–8.1)

## 2022-10-14 LAB — CBC WITH DIFFERENTIAL/PLATELET
Abs Immature Granulocytes: 0.04 10*3/uL (ref 0.00–0.07)
Basophils Absolute: 0 10*3/uL (ref 0.0–0.1)
Basophils Relative: 0 %
Eosinophils Absolute: 0.1 10*3/uL (ref 0.0–0.5)
Eosinophils Relative: 1 %
HCT: 31.6 % — ABNORMAL LOW (ref 39.0–52.0)
Hemoglobin: 11.4 g/dL — ABNORMAL LOW (ref 13.0–17.0)
Immature Granulocytes: 0 %
Lymphocytes Relative: 18 %
Lymphs Abs: 2.1 10*3/uL (ref 0.7–4.0)
MCH: 27.7 pg (ref 26.0–34.0)
MCHC: 36.1 g/dL — ABNORMAL HIGH (ref 30.0–36.0)
MCV: 76.7 fL — ABNORMAL LOW (ref 80.0–100.0)
Monocytes Absolute: 0.6 10*3/uL (ref 0.1–1.0)
Monocytes Relative: 5 %
Neutro Abs: 8.5 10*3/uL — ABNORMAL HIGH (ref 1.7–7.7)
Neutrophils Relative %: 76 %
Platelets: 110 10*3/uL — ABNORMAL LOW (ref 150–400)
RBC: 4.12 MIL/uL — ABNORMAL LOW (ref 4.22–5.81)
RDW: 14.8 % (ref 11.5–15.5)
WBC: 11.4 10*3/uL — ABNORMAL HIGH (ref 4.0–10.5)
nRBC: 0.2 % (ref 0.0–0.2)

## 2022-10-14 LAB — RETICULOCYTES
Immature Retic Fract: 24.4 % — ABNORMAL HIGH (ref 2.3–15.9)
RBC.: 4.04 MIL/uL — ABNORMAL LOW (ref 4.22–5.81)
Retic Count, Absolute: 137.4 10*3/uL (ref 19.0–186.0)
Retic Ct Pct: 3.4 % — ABNORMAL HIGH (ref 0.4–3.1)

## 2022-10-14 MED ORDER — ACETAMINOPHEN 650 MG RE SUPP: 650.0000 mg | Freq: Four times a day (QID) | RECTAL | Status: DC | PRN

## 2022-10-14 MED ORDER — HYDROMORPHONE HCL 2 MG/ML IJ SOLN
2.0000 mg | INTRAMUSCULAR | Status: AC
  Administered 2022-10-14: 2 mg via INTRAVENOUS
  Filled 2022-10-14: qty 1

## 2022-10-14 MED ORDER — SENNOSIDES-DOCUSATE SODIUM 8.6-50 MG PO TABS: 1.0000 | ORAL_TABLET | Freq: Every evening | ORAL | Status: DC | PRN

## 2022-10-14 MED ORDER — ACETAMINOPHEN 325 MG PO TABS: 650.0000 mg | ORAL_TABLET | Freq: Four times a day (QID) | ORAL | Status: DC | PRN

## 2022-10-14 MED ORDER — NALOXONE HCL 0.4 MG/ML IJ SOLN: 0.4000 mg | INTRAMUSCULAR | Status: DC | PRN

## 2022-10-14 MED ORDER — HYDROMORPHONE 1 MG/ML IV SOLN: INTRAVENOUS | Status: DC

## 2022-10-14 MED ORDER — KETOROLAC TROMETHAMINE 15 MG/ML IJ SOLN
15.0000 mg | INTRAMUSCULAR | Status: AC
  Administered 2022-10-14: 15 mg via INTRAVENOUS
  Filled 2022-10-14: qty 1

## 2022-10-14 MED ORDER — OXYCODONE HCL 5 MG PO TABS
10.0000 mg | ORAL_TABLET | Freq: Four times a day (QID) | ORAL | Status: DC | PRN
  Administered 2022-10-14: 10 mg via ORAL
  Filled 2022-10-14: qty 2

## 2022-10-14 MED ORDER — ENOXAPARIN SODIUM 40 MG/0.4ML IJ SOSY: 40.0000 mg | PREFILLED_SYRINGE | INTRAMUSCULAR | Status: DC

## 2022-10-14 MED ORDER — ONDANSETRON HCL 4 MG/2ML IJ SOLN: 4.0000 mg | Freq: Four times a day (QID) | INTRAMUSCULAR | Status: DC | PRN

## 2022-10-14 MED ORDER — ONDANSETRON HCL 4 MG/2ML IJ SOLN
4.0000 mg | Freq: Once | INTRAMUSCULAR | Status: AC
  Administered 2022-10-14: 4 mg via INTRAVENOUS
  Filled 2022-10-14: qty 2

## 2022-10-14 MED ORDER — DIPHENHYDRAMINE HCL 25 MG PO CAPS
25.0000 mg | ORAL_CAPSULE | ORAL | Status: DC | PRN
  Administered 2022-10-15: 25 mg via ORAL
  Filled 2022-10-14: qty 1

## 2022-10-14 MED ORDER — SODIUM CHLORIDE 0.9% FLUSH: 9.0000 mL | INTRAVENOUS | Status: DC | PRN

## 2022-10-14 MED ORDER — SODIUM CHLORIDE 0.45 % IV SOLN: INTRAVENOUS | Status: DC

## 2022-10-14 MED ORDER — MORPHINE SULFATE (PF) 2 MG/ML IV SOLN: 2.0000 mg | INTRAVENOUS | Status: DC | PRN

## 2022-10-14 NOTE — ED Triage Notes (Signed)
BIBA for sickle cell crisis that started an hour PTA. Patient reports pain all over 10 out of 10. During route patient received  100mg  of fentanyl and 4mg  of zofran IV. NAD at time of arrival. MAE, respirations even and unlabored. Denies any other symptom at this time.

## 2022-10-14 NOTE — H&P (Signed)
PCP:   Dorena Dew, FNP   Chief Complaint:  Sickle cell pain  HPI: This is a 32 year old male with past medical history significant for sickle cell disease, chronic pain syndrome, opiate dependence and tolerance, and anemia of chronic disease.  Per patient today he went to [facility] and ate, after she developed nausea and vomiting.  He states this triggered his pain crisis.  This pain is in his knees and back which is typical.  Per patient he ran out of his opioid medications today.  His insurance has decreased his covered monthly Zofran to 8 pills monthly as opposed to 30.  Per patient he has frequent nausea and vomiting with pain and without the Zofran that his crisis is more frequently triggered.  He states he has been in and out of the hospital for the past 3 to 4 weeks because of this.  In the ER he received IV Zofran, 6 mg IV Dilaudid, 15 mg IV Toradol and IV fluid hydration.  Patient pain persisted.  Patient was requested.  Review of Systems:  The patient denies anorexia, fever, weight loss, vision loss, decreased hearing, hoarseness, chest pain, syncope, dyspnea on exertion, peripheral edema, balance deficits, hemoptysis, abdominal pain, melena, hematochezia, severe indigestion/heartburn, hematuria, incontinence, genital sores, muscle weakness, suspicious skin lesions, transient blindness, difficulty walking, depression, unusual weight change, abnormal bleeding, enlarged lymph nodes, angioedema, and breast masses. Positives: Nausea, vomiting, musculoskeletal pain  Past Medical History: Past Medical History:  Diagnosis Date   Acute maxillary sinusitis    Eczema    Sickle cell anemia ([place]) .   Past Surgical History:  Procedure Laterality Date   FOOT SURGERY Left 12/25/2020   WISDOM TOOTH EXTRACTION      Medications: Prior to Admission medications   Medication Sig Start Date End Date Taking? Authorizing Provider  dicyclomine (BENTYL) 20 MG tablet TAKE 1 TABLET BY MOUTH  TWICE A DAY Patient taking differently: Take 20 mg by mouth daily as needed for spasms. 03/25/22   Tresa Garter, MD  folic acid (FOLVITE) 1 MG tablet Take 1 tablet (1 mg total) by mouth daily. 08/12/22 08/12/23  Dorena Dew, FNP  loratadine (CLARITIN) 10 MG tablet Take 1 tablet (10 mg total) by mouth daily. Patient taking differently: Take 10 mg by mouth daily as needed for allergies or rhinitis. 11/13/20   Vevelyn Francois, NP  naproxen (NAPROSYN) 500 MG tablet Take 1 tablet (500 mg total) by mouth 2 (two) times daily with a meal. Patient taking differently: Take 500 mg by mouth 2 (two) times daily as needed for mild pain. 08/12/22   Dorena Dew, FNP  ondansetron (ZOFRAN-ODT) 4 MG disintegrating tablet Take 1 tablet (4 mg total) by mouth every 8 (eight) hours as needed for nausea or vomiting. 05/31/22   Dorena Dew, FNP  Oxycodone HCl 10 MG TABS Take 1 tablet (10 mg total) by mouth every 6 (six) hours as needed. Patient taking differently: Take 10 mg by mouth every 6 (six) hours as needed (For pain). 09/29/22   Dorena Dew, FNP  triamcinolone (NASACORT) 55 MCG/ACT AERO nasal inhaler Place 2 sprays into the nose daily. 2 sprays each nostril at night 04/27/21   Rozetta Nunnery, MD  Vitamin D, Ergocalciferol, (DRISDOL) 1.25 MG (50000 UNIT) CAPS capsule TAKE 1 CAPSULE (50,000 UNITS TOTAL) BY MOUTH ONCE A WEEK. X 12 WEEKS. Patient taking differently: Take 50,000 Units by mouth every 7 (seven) days. 04/13/22   Fenton Foy, NP  Allergies:  No Known Allergies  Social History:  reports that he quit smoking about 8 years ago. His smoking use included cigarettes. He smoked an average of .1 packs per day. He has never used smokeless tobacco. He reports current alcohol use of about 1.0 standard drink of alcohol per week. He reports current drug use. Drug: Marijuana.  Family History: Family History  Problem Relation Age of Onset   Sickle cell trait Mother    Diabetes  Father    Hypertension Father     Physical Exam: Vitals:   10/14/22 2000 10/14/22 2015 10/14/22 2030 10/14/22 2045  BP: 113/80 119/77 125/80 124/80  Pulse: 74 79 81 71  Resp: 13 13 16 13   Temp:      TempSrc:      SpO2: 100% 98% 100% 99%  Weight:      Height:        General:  Alert and oriented times three, well developed and nourished, no acute distress Eyes: PERRLA, pink conjunctiva, no scleral icterus ENT: Moist oral mucosa, neck supple, no thyromegaly Lungs: clear to ascultation, no wheeze, no crackles, no use of accessory muscles Cardiovascular: regular rate and rhythm, no regurgitation, no gallops, no murmurs. No carotid bruits, no JVD Abdomen: soft, positive BS, non-tender, non-distended, no organomegaly, not an acute abdomen GU: not examined Neuro: CN II - XII grossly intact, sensation intact Musculoskeletal: strength 5/5 all extremities, no clubbing, cyanosis or edema Skin: no rash, no subcutaneous crepitation, no decubitus Psych: appropriate patient   Labs on Admission:  Recent Labs    10/14/22 1851  NA 138  K 4.1  CL 104  CO2 25  GLUCOSE 111*  BUN 13  CREATININE 0.79  CALCIUM 9.1   Recent Labs    10/14/22 1851  AST 10*  ALT 14  ALKPHOS 40  BILITOT 1.3*  PROT 7.7  ALBUMIN 4.5    Recent Labs    10/14/22 1851  WBC 11.4*  NEUTROABS 8.5*  HGB 11.4*  HCT 31.6*  MCV 76.7*  PLT 110*    Recent Labs    10/14/22 1851  RETICCTPCT 3.4*     Radiological Exams on Admission: No results found.  Assessment/Plan Present on Admission:  Sickle cell crisis (HCC)  Chronic pain syndrome -IV fluid hydration 0.45 NS -PCA pump-Dilaudid -LDH, reticulocyte count, CMP in a.m.   Thrombocytopenia ([place]) -Chronic, monitor  Mikel Pyon 10/14/2022, 9:00 PM

## 2022-10-14 NOTE — ED Provider Triage Note (Signed)
Emergency Medicine Provider Triage Evaluation Note  Austin [name] , a 32 y.o. male  was evaluated in triage.  Pt complains of sickle cell pain that started around 4 PM today.  Complains of pain on both legs and back.  Denies chest pain or shortness of breath.  Endorses nausea without vomiting..  Review of Systems  Positive: As above Negative: As above  Physical Exam  BP (!) 120/90 (BP Location: Left Arm)   Pulse 80   Temp 98 F (36.7 C) (Oral)   Resp 18   Ht 5\' 8"  (1.727 m)   Wt 79.4 kg   SpO2 96%   BMI 26.62 kg/m  Gen:   Awake, no distress   Resp:  Normal effort  MSK:   Moves extremities without difficulty  Other:    Medical Decision Making  Medically screening exam initiated at 5:31 PM.  Appropriate orders placed.  Ankit Kopecky Verhagen was informed that the remainder of the evaluation will be completed by another provider, this initial triage assessment does not replace that evaluation, and the importance of remaining in the ED until their evaluation is complete.    Rex Kras, [place] 10/14/22 1731

## 2022-10-14 NOTE — ED Provider Notes (Signed)
[facility] Provider Note   CSN: [id number] Arrival date & time: 10/14/22  1713     History  Chief Complaint  Patient presents with   Sickle Cell Pain Crisis    Started one hour PTA, states out of pain medications.    Emesis   Abdominal Pain    Austin [name] is a 32 y.o. male history of sickle cell here presenting with leg pain and back pain.  Patient states that he is on chronic pain meds for his sickle cell.  He states that he has worsening leg pain and back pain.  He states that he ran out of his pain medicine.  Denies any fevers or chills or cough.  Patient has multiple admissions for sickle cell pain crisis  The history is provided by the patient.       Home Medications Prior to Admission medications   Medication Sig Start Date End Date Taking? Authorizing Provider  dicyclomine (BENTYL) 20 MG tablet TAKE 1 TABLET BY MOUTH TWICE A DAY Patient taking differently: Take 20 mg by mouth daily as needed for spasms. 03/25/22   Tresa Garter, MD  folic acid (FOLVITE) 1 MG tablet Take 1 tablet (1 mg total) by mouth daily. 08/12/22 08/12/23  Dorena Dew, FNP  loratadine (CLARITIN) 10 MG tablet Take 1 tablet (10 mg total) by mouth daily. Patient taking differently: Take 10 mg by mouth daily as needed for allergies or rhinitis. 11/13/20   Vevelyn Francois, NP  naproxen (NAPROSYN) 500 MG tablet Take 1 tablet (500 mg total) by mouth 2 (two) times daily with a meal. Patient taking differently: Take 500 mg by mouth 2 (two) times daily as needed for mild pain. 08/12/22   Dorena Dew, FNP  ondansetron (ZOFRAN-ODT) 4 MG disintegrating tablet Take 1 tablet (4 mg total) by mouth every 8 (eight) hours as needed for nausea or vomiting. 05/31/22   Dorena Dew, FNP  Oxycodone HCl 10 MG TABS Take 1 tablet (10 mg total) by mouth every 6 (six) hours as needed. Patient taking differently: Take 10 mg by mouth every 6 (six) hours as needed (For pain). 09/29/22    Dorena Dew, FNP  triamcinolone (NASACORT) 55 MCG/ACT AERO nasal inhaler Place 2 sprays into the nose daily. 2 sprays each nostril at night 04/27/21   Rozetta Nunnery, MD  Vitamin D, Ergocalciferol, (DRISDOL) 1.25 MG (50000 UNIT) CAPS capsule TAKE 1 CAPSULE (50,000 UNITS TOTAL) BY MOUTH ONCE A WEEK. X 12 WEEKS. Patient taking differently: Take 50,000 Units by mouth every 7 (seven) days. 04/13/22   Fenton Foy, NP      Allergies    Patient has no known allergies.    Review of Systems   Review of Systems  Musculoskeletal:        Leg and back pain  All other systems reviewed and are negative.   Physical Exam Updated Vital Signs BP 124/80   Pulse 71   Temp 97.6 F (36.4 C) (Oral)   Resp 13   Ht 5\' 8"  (1.727 m)   Wt 79.4 kg   SpO2 99%   BMI 26.62 kg/m  Physical Exam Vitals and nursing note reviewed.  Constitutional:      Comments: Uncomfortable  HENT:     Head: Normocephalic.     Mouth/Throat:     Mouth: Mucous membranes are moist.  Eyes:     Extraocular Movements: Extraocular movements intact.     Pupils: Pupils  are equal, round, and reactive to light.  Cardiovascular:     Rate and Rhythm: Normal rate and regular rhythm.     Heart sounds: Normal heart sounds.  Pulmonary:     Effort: Pulmonary effort is normal.     Breath sounds: Normal breath sounds.  Abdominal:     General: Abdomen is flat.  Skin:    General: Skin is warm.     Capillary Refill: Capillary refill takes less than 2 seconds.  Neurological:     General: No focal deficit present.     Mental Status: He is oriented to person, place, and time.  Psychiatric:        Mood and Affect: Mood normal.        Behavior: Behavior normal.     ED Results / Procedures / Treatments   Labs (all labs ordered are listed, but only abnormal results are displayed) Labs Reviewed  RETICULOCYTES - Abnormal; Notable for the following components:      Result Value   Retic Ct Pct 3.4 (*)    RBC. 4.04 (*)     Immature Retic Fract 24.4 (*)    All other components within normal limits  CBC WITH DIFFERENTIAL/PLATELET - Abnormal; Notable for the following components:   WBC 11.4 (*)    RBC 4.12 (*)    Hemoglobin 11.4 (*)    HCT 31.6 (*)    MCV 76.7 (*)    MCHC 36.1 (*)    Platelets 110 (*)    Neutro Abs 8.5 (*)    All other components within normal limits  COMPREHENSIVE METABOLIC PANEL - Abnormal; Notable for the following components:   Glucose, Bld 111 (*)    AST 10 (*)    Total Bilirubin 1.3 (*)    All other components within normal limits  RETICULOCYTES  LACTATE DEHYDROGENASE  CBC  CREATININE, SERUM  BASIC METABOLIC PANEL  CBC WITH DIFFERENTIAL/PLATELET    EKG EKG Interpretation  Date/Time:  Thursday October 14 2022 17:52:04 EDT Ventricular Rate:  71 PR Interval:  113 QRS Duration: 79 QT Interval:  379 QTC Calculation: 412 R Axis:   83 Text Interpretation: Sinus rhythm Borderline short PR interval LVH by voltage  J point elevation No significant change since last tracing Confirmed by Wandra Arthurs [phone]) on 10/14/2022 7:27:28 PM  Radiology No results found.  Procedures Procedures    Medications Ordered in ED Medications  0.45 % sodium chloride infusion ( Intravenous New Bag/Given 10/14/22 1857)  oxyCODONE (Oxy IR/ROXICODONE) immediate release tablet 10 mg (has no administration in time range)  morphine (PF) 2 MG/ML injection 2 mg (has no administration in time range)  ondansetron (ZOFRAN) injection 4 mg (has no administration in time range)  enoxaparin (LOVENOX) injection 40 mg (has no administration in time range)  acetaminophen (TYLENOL) tablet 650 mg (has no administration in time range)    Or  acetaminophen (TYLENOL) suppository 650 mg (has no administration in time range)  senna-docusate (Senokot-S) tablet 1 tablet (has no administration in time range)  ketorolac (TORADOL) 15 MG/ML injection 15 mg (15 mg Intravenous Given 10/14/22 1858)  HYDROmorphone (DILAUDID)  injection 2 mg (2 mg Intravenous Given 10/14/22 1858)  HYDROmorphone (DILAUDID) injection 2 mg (2 mg Intravenous Given 10/14/22 1948)  HYDROmorphone (DILAUDID) injection 2 mg (2 mg Intravenous Given 10/14/22 2027)  ondansetron (ZOFRAN) injection 4 mg (4 mg Intravenous Given 10/14/22 1908)    ED Course/ Medical Decision Making/ A&P  Medical Decision Making HEMI SCHULENBURG is a 32 y.o. male here presenting with leg and back pain.  Patient has history of sickle cell and likely has pain crisis.  Plan to get CBC and BMP and reticulocyte count.  Will give pain medicine as per protocol  9 pm Patient is still in significant amount of pain despite 3 doses of pain meds. Will admit for pain control   Problems Addressed: Sickle cell anemia with pain [facility]): acute illness or injury  Amount and/or Complexity of Data Reviewed Labs: ordered. Decision-making details documented in ED Course.  Risk Prescription drug management. Decision regarding hospitalization.    Final Clinical Impression(s) / ED Diagnoses Final diagnoses:  None    Rx / DC Orders ED Discharge Orders     None         Drenda Freeze, MD 10/14/22 2139

## 2022-10-14 NOTE — ED Notes (Signed)
Pt refuses xray until he receives pain medication

## 2022-10-14 NOTE — ED Notes (Signed)
ED TO INPATIENT HANDOFF REPORT  ED Nurse Name and Phone #: Courtnei Ruddell RN  S Name/Age/Gender Austin [name] 32 y.o. male Room/Bed: WA04/WA04  Code Status   Code Status: Prior  Home/SNF/Other Home Patient oriented to: self, place, time, and situation Is this baseline? Yes   Triage Complete: Triage complete  Chief Complaint Sickle cell crisis ([place]) [D57.00]  Triage Note BIBA for sickle cell crisis that started an hour PTA. Patient reports pain all over 10 out of 10. During route patient received  100mg  of fentanyl and 4mg  of zofran IV. NAD at time of arrival. MAE, respirations even and unlabored. Denies any other symptom at this time.   Allergies No Known Allergies  Level of Care/Admitting Diagnosis ED Disposition     ED Disposition  Admit   Condition  --   Comment  Hospital Area: [facility] [[id number]]  Level of Care: Med-Surg [16]  May [facility] patient to [facility] if equivalent level of care is available:: Yes  Covid Evaluation: Confirmed COVID Negative  Diagnosis: Sickle cell crisis [id number]  Admitting Physician: [place], [place]  Attending Physician: Quintella Baton [id number]  Certification:: I certify this patient will need inpatient services for at least 2 midnights  Estimated Length of Stay: 2          B Medical/Surgery History Past Medical History:  Diagnosis Date   Acute maxillary sinusitis    Eczema    Sickle cell anemia ([place]) .   Past Surgical History:  Procedure Laterality Date   FOOT SURGERY Left 12/25/2020   WISDOM TOOTH EXTRACTION       A IV Location/Drains/Wounds Patient Lines/Drains/Airways Status     Active Line/Drains/Airways     Name Placement date Placement time Site Days   Peripheral IV 10/14/22 20 G Left Antecubital 10/14/22  1723  Antecubital  less than 1            Intake/Output Last 24 hours No intake or output data in the 24 hours ending 10/14/22  2103  Labs/Imaging Results for orders placed or performed during the hospital encounter of 10/14/22 (from the past 48 hour(s))  Reticulocytes     Status: Abnormal   Collection Time: 10/14/22  6:51 PM  Result Value Ref Range   Retic Ct Pct 3.4 (H) 0.4 - 3.1 %   RBC. 4.04 (L) 4.22 - 5.81 MIL/uL   Retic Count, Absolute 137.4 19.0 - 186.0 K/uL   Immature Retic Fract 24.4 (H) 2.3 - 15.9 %    Comment: Performed at [facility], [place] [address], [place], [place] [address]  CBC WITH DIFFERENTIAL     Status: Abnormal   Collection Time: 10/14/22  6:51 PM  Result Value Ref Range   WBC 11.4 (H) 4.0 - 10.5 K/uL   RBC 4.12 (L) 4.22 - 5.81 MIL/uL   Hemoglobin 11.4 (L) 13.0 - 17.0 g/dL   HCT 31.6 (L) 39.0 - 52.0 %   MCV 76.7 (L) 80.0 - 100.0 fL   MCH 27.7 26.0 - 34.0 pg   MCHC 36.1 (H) 30.0 - 36.0 g/dL   RDW 14.8 11.5 - 15.5 %   Platelets 110 (L) 150 - 400 K/uL   nRBC 0.2 0.0 - 0.2 %   Neutrophils Relative % 76 %   Neutro Abs 8.5 (H) 1.7 - 7.7 K/uL   Lymphocytes Relative 18 %   Lymphs Abs 2.1 0.7 - 4.0 K/uL   Monocytes Relative 5 %   Monocytes  Absolute 0.6 0.1 - 1.0 K/uL   Eosinophils Relative 1 %   Eosinophils Absolute 0.1 0.0 - 0.5 K/uL   Basophils Relative 0 %   Basophils Absolute 0.0 0.0 - 0.1 K/uL   Immature Granulocytes 0 %   Abs Immature Granulocytes 0.04 0.00 - 0.07 K/uL    Comment: Performed at [facility], [place] [address], [place], [place] [address]  Comprehensive metabolic panel     Status: Abnormal   Collection Time: 10/14/22  6:51 PM  Result Value Ref Range   Sodium 138 135 - 145 mmol/L   Potassium 4.1 3.5 - 5.1 mmol/L   Chloride 104 98 - 111 mmol/L   CO2 25 22 - 32 mmol/L   Glucose, Bld 111 (H) 70 - 99 mg/dL    Comment: Glucose reference range applies only to samples taken after fasting for at least 8 hours.   BUN 13 6 - 20 mg/dL   Creatinine, Ser 0.79 0.61 - 1.24 mg/dL   Calcium 9.1 8.9 - 10.3 mg/dL   Total Protein 7.7 6.5 - 8.1  g/dL   Albumin 4.5 3.5 - 5.0 g/dL   AST 10 (L) 15 - 41 U/L   ALT 14 0 - 44 U/L   Alkaline Phosphatase 40 38 - 126 U/L   Total Bilirubin 1.3 (H) 0.3 - 1.2 mg/dL   GFR, Estimated >60 >60 mL/min    Comment: (NOTE) Calculated using the CKD-EPI Creatinine Equation (2021)    Anion gap 9 5 - 15    Comment: Performed at [facility], [place] [address], [place], [place] [address]   No results found.  Pending Labs Unresulted Labs (From admission, onward)    None       Vitals/Pain Today's Vitals   10/14/22 2030 10/14/22 2031 10/14/22 2045 10/14/22 2049  BP: 125/80  124/80   Pulse: 81  71   Resp: 16  13   Temp:      TempSrc:      SpO2: 100%  99%   Weight:      Height:      PainSc:  7   7     Isolation Precautions No active isolations  Medications Medications  0.45 % sodium chloride infusion ( Intravenous New Bag/Given 10/14/22 1857)  ketorolac (TORADOL) 15 MG/ML injection 15 mg (15 mg Intravenous Given 10/14/22 1858)  HYDROmorphone (DILAUDID) injection 2 mg (2 mg Intravenous Given 10/14/22 1858)  HYDROmorphone (DILAUDID) injection 2 mg (2 mg Intravenous Given 10/14/22 1948)  HYDROmorphone (DILAUDID) injection 2 mg (2 mg Intravenous Given 10/14/22 2027)  ondansetron (ZOFRAN) injection 4 mg (4 mg Intravenous Given 10/14/22 1908)    Mobility walks     Focused Assessments Cardiac Assessment Handoff:  Cardiac Rhythm: Normal sinus rhythm Lab Results  Component Value Date   TROPONINI <0.03 04/13/2018   Lab Results  Component Value Date   DDIMER 0.51 (H) 01/23/2019   Does the Patient currently have chest pain? No    R Recommendations: See Admitting Provider Note  Report given to:   Additional Notes: - sickle cell pain crisis. Given Dilaudid 2 mg x3, rating pain 7/10.

## 2022-10-15 ENCOUNTER — Other Ambulatory Visit (HOSPITAL_COMMUNITY): Payer: Self-pay

## 2022-10-15 ENCOUNTER — Encounter (HOSPITAL_COMMUNITY): Payer: Self-pay | Admitting: Family Medicine

## 2022-10-15 LAB — BASIC METABOLIC PANEL
Anion gap: 8 (ref 5–15)
BUN: 9 mg/dL (ref 6–20)
CO2: 25 mmol/L (ref 22–32)
Calcium: 8.7 mg/dL — ABNORMAL LOW (ref 8.9–10.3)
Chloride: 102 mmol/L (ref 98–111)
Creatinine, Ser: 0.81 mg/dL (ref 0.61–1.24)
GFR, Estimated: >60 mL/min (ref >60)
Glucose, Bld: 82 mg/dL (ref 70–99)
Potassium: 3.7 mmol/L (ref 3.5–5.1)
Sodium: 135 mmol/L (ref 135–145)

## 2022-10-15 LAB — CBC WITH DIFFERENTIAL/PLATELET
Abs Immature Granulocytes: 0.02 10*3/uL (ref 0.00–0.07)
Basophils Absolute: 0 10*3/uL (ref 0.0–0.1)
Basophils Relative: 0 %
Eosinophils Absolute: 0.1 10*3/uL (ref 0.0–0.5)
Eosinophils Relative: 1 %
HCT: 30.4 % — ABNORMAL LOW (ref 39.0–52.0)
Hemoglobin: 10.6 g/dL — ABNORMAL LOW (ref 13.0–17.0)
Immature Granulocytes: 0 %
Lymphocytes Relative: 40 %
Lymphs Abs: 3.2 10*3/uL (ref 0.7–4.0)
MCH: 27 pg (ref 26.0–34.0)
MCHC: 34.9 g/dL (ref 30.0–36.0)
MCV: 77.6 fL — ABNORMAL LOW (ref 80.0–100.0)
Monocytes Absolute: 0.6 10*3/uL (ref 0.1–1.0)
Monocytes Relative: 8 %
Neutro Abs: 4 10*3/uL (ref 1.7–7.7)
Neutrophils Relative %: 51 %
Platelets: 92 10*3/uL — ABNORMAL LOW (ref 150–400)
RBC: 3.92 MIL/uL — ABNORMAL LOW (ref 4.22–5.81)
RDW: 14.8 % (ref 11.5–15.5)
WBC: 7.9 10*3/uL (ref 4.0–10.5)
nRBC: 0.4 % — ABNORMAL HIGH (ref 0.0–0.2)

## 2022-10-15 LAB — RETICULOCYTES
Immature Retic Fract: 24.7 % — ABNORMAL HIGH (ref 2.3–15.9)
RBC.: 3.85 MIL/uL — ABNORMAL LOW (ref 4.22–5.81)
Retic Count, Absolute: 137.8 10*3/uL (ref 19.0–186.0)
Retic Ct Pct: 3.6 % — ABNORMAL HIGH (ref 0.4–3.1)

## 2022-10-15 LAB — LACTATE DEHYDROGENASE: LDH: 159 U/L (ref 98–192)

## 2022-10-15 MED ORDER — OXYCODONE HCL 10 MG PO TABS
10.0000 mg | ORAL_TABLET | Freq: Four times a day (QID) | ORAL | 0 refills | Status: DC | PRN
  Filled 2022-10-15 – 2022-11-01 (×2): qty 60, 15d supply, fill #0

## 2022-10-15 MED ORDER — ONDANSETRON 4 MG PO TBDP: 4.0000 mg | ORAL_TABLET | Freq: Four times a day (QID) | ORAL | 3 refills | Status: DC | PRN

## 2022-10-15 MED ORDER — ONDANSETRON 4 MG PO TBDP
4.0000 mg | ORAL_TABLET | Freq: Four times a day (QID) | ORAL | 3 refills | Status: DC | PRN
  Filled 2022-10-15: qty 30, 8d supply, fill #0
  Filled 2022-11-29: qty 18, 21d supply, fill #1
  Filled 2022-12-28: qty 18, 21d supply, fill #2
  Filled 2023-01-24: qty 18, 21d supply, fill #3
  Filled 2023-03-01: qty 18, 21d supply, fill #4
  Filled 2023-03-30: qty 18, 21d supply, fill #5

## 2022-10-15 MED ORDER — HYDROMORPHONE 1 MG/ML IV SOLN: INTRAVENOUS | Status: DC

## 2022-10-15 MED ORDER — HYDROMORPHONE 1 MG/ML IV SOLN
INTRAVENOUS | Status: DC
  Administered 2022-10-15: 1 mg via INTRAVENOUS
  Administered 2022-10-15: 30 mg via INTRAVENOUS
  Filled 2022-10-15: qty 30

## 2022-10-15 MED ORDER — OXYCODONE HCL 10 MG PO TABS: 10.0000 mg | ORAL_TABLET | Freq: Four times a day (QID) | ORAL | 0 refills | Status: DC | PRN

## 2022-10-15 NOTE — Discharge Summary (Signed)
Physician Discharge Summary  Austin [name] [id number] DOB: [birth year] DOA: 10/14/2022  PCP: Dorena Dew, FNP  Admit date: 10/14/2022  Discharge date: 10/15/2022  Discharge Diagnoses:  Principal Problem:   Sickle cell crisis ([place]) Active Problems:   Thrombocytopenia ([place])   Chronic pain syndrome   Discharge Condition: Stable  Disposition:   Follow-up Information     Dorena Dew, FNP Follow up in 1 week(s).   Specialty: Family Medicine Contact information: [address]. [place] [address] [phone]                Pt is discharged home in good condition and is to follow up with Dorena Dew, FNP this week to have labs evaluated. Austin [name] is instructed to increase activity slowly and balance with rest for the next few days, and use prescribed medication to complete treatment of pain  Diet: Regular Wt Readings from Last 3 Encounters:  10/14/22 77.2 kg  10/08/22 79.4 kg  10/05/22 76.9 kg    History of present illness:    Hospital Course:  Patient was admitted for sickle cell pain crisis and managed appropriately with IVF, IV Dilaudid via PCA and IV Toradol, as well as other adjunct therapies per sickle cell pain management protocols.  Patient was therefore discharged home today in a hemodynamically stable condition.   Austin [name] will follow-up with PCP within 1 week of this discharge. Austin [name] was counseled extensively about nonpharmacologic means of pain management, patient verbalized understanding and was appreciative of  the care received during this admission.   We discussed the need for good hydration, monitoring of hydration status, avoidance of heat, cold, stress, and infection triggers. We discussed the need to be adherent with taking Hydrea and other home medications. Patient was reminded of the need to seek medical attention immediately if any symptom of bleeding, anemia, or infection occurs.  Discharge  Exam: Vitals:   10/15/22 0725 10/15/22 0951  BP:  110/65  Pulse:  80  Resp: 14 20  Temp:  98.2 F (36.8 C)  SpO2: 99% 100%   Vitals:   10/15/22 0359 10/15/22 0544 10/15/22 0725 10/15/22 0951  BP:  (!) 103/54  110/65  Pulse:  63  80  Resp: 12 14 14 20   Temp:  98.2 F (36.8 C)  98.2 F (36.8 C)  TempSrc:  Oral  Oral  SpO2: 98% 100% 99% 100%  Weight:      Height:        General appearance : Awake, alert, not in any distress. Speech Clear. Not toxic looking HEENT: Atraumatic and Normocephalic, pupils equally reactive to light and accomodation Neck: Supple, no JVD. No cervical lymphadenopathy.  Chest: Good air entry bilaterally, no added sounds  CVS: S1 S2 regular, no murmurs.  Abdomen: Bowel sounds present, Non tender and not distended with no gaurding, rigidity or rebound. Extremities: B/L Lower Ext shows no edema, both legs are warm to touch Neurology: Awake alert, and oriented X 3, CN II-XII intact, Non focal Skin: No Rash  Discharge Instructions   Allergies as of 10/15/2022   No Known Allergies      Medication List     TAKE these medications    dicyclomine 20 MG tablet Commonly known as: BENTYL TAKE 1 TABLET BY MOUTH TWICE A DAY What changed:  when to take this reasons to take this   folic acid 1 MG tablet Commonly known as: FOLVITE Take 1 tablet (1 mg total) by mouth daily.  loratadine 10 MG tablet Commonly known as: CLARITIN Take 1 tablet (10 mg total) by mouth daily. What changed:  when to take this reasons to take this   naproxen 500 MG tablet Commonly known as: Naprosyn Take 1 tablet (500 mg total) by mouth 2 (two) times daily with a meal. What changed:  when to take this reasons to take this   ondansetron 4 MG disintegrating tablet Commonly known as: ZOFRAN-ODT Take 1 tablet (4 mg total) by mouth every 6 (six) hours as needed for nausea or vomiting. What changed: when to take this   Oxycodone HCl 10 MG Tabs Take 1 tablet (10 mg  total) by mouth every 6 (six) hours as needed. What changed:  when to take this reasons to take this additional instructions   sildenafil 100 MG tablet Commonly known as: VIAGRA Take 100 mg by mouth daily as needed (for E.D.).   triamcinolone 55 MCG/ACT Aero nasal inhaler Commonly known as: NASACORT Place 2 sprays into the nose daily. 2 sprays each nostril at night   Vitamin D (Ergocalciferol) 1.25 MG (50000 UNIT) Caps capsule Commonly known as: DRISDOL TAKE 1 CAPSULE (50,000 UNITS TOTAL) BY MOUTH ONCE A WEEK. X 12 WEEKS. What changed: See the new instructions.        The results of significant diagnostics from this hospitalization (including imaging, microbiology, ancillary and laboratory) are listed below for reference.    Significant Diagnostic Studies: DG Chest 2 View  Result Date: 10/01/2022 CLINICAL DATA:  History of sickle cell anemia with cough and worsening body pain EXAM: CHEST - 2 VIEW COMPARISON:  Chest radiograph dated 09/26/2022 FINDINGS: Normal lung volumes. No focal consolidations. No pleural effusion or pneumothorax. Similar cardiomediastinal silhouette. Mild chronic osseous changes of sickle cell anemia. IMPRESSION: No focal consolidations. Electronically Signed   By: Darrin Nipper M.D.   On: 10/01/2022 08:13   DG Chest 2 View  Result Date: 09/26/2022 CLINICAL DATA:  Cough with nausea.  Sickle cell crisis. EXAM: CHEST - 2 VIEW COMPARISON:  02/03/2022 FINDINGS: The lungs are clear without focal pneumonia, edema, pneumothorax or pleural effusion. The cardiopericardial silhouette is within normal limits for size. The visualized bony structures of the thorax are unremarkable. Telemetry leads overlie the chest. IMPRESSION: No active cardiopulmonary disease. Electronically Signed   By: Misty Stanley M.D.   On: 09/26/2022 10:09    Microbiology: No results found for this or any previous visit (from the past 240 hour(s)).   Labs: Basic Metabolic Panel: Recent Labs  Lab  10/09/22 0613 10/14/22 1851 10/15/22 0639  NA 132* 138 135  K 3.3* 4.1 3.7  CL 98 104 102  CO2 27 25 25   GLUCOSE 116* 111* 82  BUN 11 13 9   CREATININE 0.82 0.79 0.81  CALCIUM 8.9 9.1 8.7*   Liver Function Tests: Recent Labs  Lab 10/14/22 1851  AST 10*  ALT 14  ALKPHOS 40  BILITOT 1.3*  PROT 7.7  ALBUMIN 4.5   No results for input(s): "LIPASE", "AMYLASE" in the last 168 hours. No results for input(s): "AMMONIA" in the last 168 hours. CBC: Recent Labs  Lab 10/09/22 0613 10/14/22 1851 10/15/22 0639  WBC 10.1 11.4* 7.9  NEUTROABS  --  8.5* 4.0  HGB 10.3* 11.4* 10.6*  HCT 29.0* 31.6* 30.4*  MCV 76.9* 76.7* 77.6*  PLT 120* 110* 92*   Cardiac Enzymes: No results for input(s): "CKTOTAL", "CKMB", "CKMBINDEX", "TROPONINI" in the last 168 hours. BNP: Invalid input(s): "POCBNP" CBG: No results for input(s): "GLUCAP"  in the last 168 hours.  Time coordinating discharge: 50 minutes  Signed:  [facility] 10/15/2022, 10:58 AM

## 2022-10-16 ENCOUNTER — Emergency Department (HOSPITAL_COMMUNITY)
Admission: EM | Admit: 2022-10-16 | Discharge: 2022-10-17 | Disposition: A | Discharge status: Home / Self Care | Payer: 59 | Attending: Emergency Medicine | Admitting: Emergency Medicine

## 2022-10-16 ENCOUNTER — Other Ambulatory Visit: Payer: Self-pay

## 2022-10-16 ENCOUNTER — Encounter (HOSPITAL_COMMUNITY): Payer: Self-pay

## 2022-10-16 DIAGNOSIS — D5700 Hb-SS disease with crisis, unspecified: Secondary | ICD-10-CM | POA: Diagnosis not present

## 2022-10-16 DIAGNOSIS — R112 Nausea with vomiting, unspecified: Secondary | ICD-10-CM

## 2022-10-16 DIAGNOSIS — M25551 Pain in right hip: Secondary | ICD-10-CM | POA: Diagnosis present

## 2022-10-16 LAB — COMPREHENSIVE METABOLIC PANEL
ALT: 16 U/L (ref 0–44)
AST: 8 U/L — ABNORMAL LOW (ref 15–41)
Albumin: 4.5 g/dL (ref 3.5–5.0)
Alkaline Phosphatase: 45 U/L (ref 38–126)
Anion gap: 7 (ref 5–15)
BUN: 10 mg/dL (ref 6–20)
CO2: 27 mmol/L (ref 22–32)
Calcium: 9.3 mg/dL (ref 8.9–10.3)
Chloride: 103 mmol/L (ref 98–111)
Creatinine, Ser: 0.86 mg/dL (ref 0.61–1.24)
GFR, Estimated: >60 mL/min (ref >60)
Glucose, Bld: 138 mg/dL — ABNORMAL HIGH (ref 70–99)
Potassium: 3.7 mmol/L (ref 3.5–5.1)
Sodium: 137 mmol/L (ref 135–145)
Total Bilirubin: 1.4 mg/dL — ABNORMAL HIGH (ref 0.3–1.2)
Total Protein: 7.9 g/dL (ref 6.5–8.1)

## 2022-10-16 LAB — CBC WITH DIFFERENTIAL/PLATELET
Abs Immature Granulocytes: 0.05 10*3/uL (ref 0.00–0.07)
Basophils Absolute: 0 10*3/uL (ref 0.0–0.1)
Basophils Relative: 0 %
Eosinophils Absolute: 0.1 10*3/uL (ref 0.0–0.5)
Eosinophils Relative: 1 %
HCT: 32.3 % — ABNORMAL LOW (ref 39.0–52.0)
Hemoglobin: 11.4 g/dL — ABNORMAL LOW (ref 13.0–17.0)
Immature Granulocytes: 1 %
Lymphocytes Relative: 19 %
Lymphs Abs: 1.9 10*3/uL (ref 0.7–4.0)
MCH: 27.3 pg (ref 26.0–34.0)
MCHC: 35.3 g/dL (ref 30.0–36.0)
MCV: 77.5 fL — ABNORMAL LOW (ref 80.0–100.0)
Monocytes Absolute: 0.5 10*3/uL (ref 0.1–1.0)
Monocytes Relative: 5 %
Neutro Abs: 7.7 10*3/uL (ref 1.7–7.7)
Neutrophils Relative %: 74 %
Platelets: 111 10*3/uL — ABNORMAL LOW (ref 150–400)
RBC: 4.17 MIL/uL — ABNORMAL LOW (ref 4.22–5.81)
RDW: 15.3 % (ref 11.5–15.5)
WBC: 10.3 10*3/uL (ref 4.0–10.5)
nRBC: 0.3 % — ABNORMAL HIGH (ref 0.0–0.2)

## 2022-10-16 LAB — RETICULOCYTES
Immature Retic Fract: 36.3 % — ABNORMAL HIGH (ref 2.3–15.9)
RBC.: 4.1 MIL/uL — ABNORMAL LOW (ref 4.22–5.81)
Retic Count, Absolute: 187.8 10*3/uL — ABNORMAL HIGH (ref 19.0–186.0)
Retic Ct Pct: 4.6 % — ABNORMAL HIGH (ref 0.4–3.1)

## 2022-10-16 MED ORDER — ONDANSETRON HCL 4 MG/2ML IJ SOLN
4.0000 mg | Freq: Once | INTRAMUSCULAR | Status: AC
  Administered 2022-10-16: 4 mg via INTRAVENOUS
  Filled 2022-10-16: qty 2

## 2022-10-16 MED ORDER — PROMETHAZINE HCL 25 MG PO TABS
25.0000 mg | ORAL_TABLET | Freq: Once | ORAL | Status: AC
  Administered 2022-10-17: 25 mg via ORAL
  Filled 2022-10-16: qty 1

## 2022-10-16 MED ORDER — HYDROMORPHONE HCL 1 MG/ML IJ SOLN: 0.5000 mg | INTRAMUSCULAR | Status: DC

## 2022-10-16 MED ORDER — DIPHENHYDRAMINE HCL 25 MG PO CAPS: 25.0000 mg | ORAL_CAPSULE | Freq: Four times a day (QID) | ORAL | Status: DC | PRN

## 2022-10-16 MED ORDER — DEXTROSE-NACL 5-0.45 % IV SOLN: INTRAVENOUS | Status: DC

## 2022-10-16 MED ORDER — HYDROMORPHONE HCL 2 MG/ML IJ SOLN
2.0000 mg | Freq: Once | INTRAMUSCULAR | Status: AC
  Administered 2022-10-17: 2 mg via INTRAVENOUS
  Filled 2022-10-16: qty 1

## 2022-10-16 MED ORDER — HYDROMORPHONE HCL 1 MG/ML IJ SOLN
0.5000 mg | INTRAMUSCULAR | Status: DC
  Filled 2022-10-16: qty 1

## 2022-10-16 MED ORDER — HYDROMORPHONE HCL 2 MG/ML IJ SOLN
2.0000 mg | Freq: Once | INTRAMUSCULAR | Status: AC
  Administered 2022-10-16: 2 mg via INTRAVENOUS
  Filled 2022-10-16: qty 1

## 2022-10-16 MED ORDER — DIPHENHYDRAMINE HCL 25 MG PO CAPS: 25.0000 mg | ORAL_CAPSULE | ORAL | Status: DC | PRN

## 2022-10-16 MED ORDER — KETOROLAC TROMETHAMINE 15 MG/ML IJ SOLN
15.0000 mg | INTRAMUSCULAR | Status: AC
  Administered 2022-10-16: 15 mg via INTRAVENOUS
  Filled 2022-10-16: qty 1

## 2022-10-16 NOTE — ED Triage Notes (Addendum)
Patient brought in by EMS for sickle cell pain crisis. Patient reports pain in hip and back. Received 11mcg fentanyl and 4 mg zofran pta from EMS. Patient vomited once with EMS and vomited again during triage.

## 2022-10-16 NOTE — ED Provider Notes (Signed)
[place] AT [facility] Provider Note   CSN: [id number] Arrival date & time: 10/16/22  2106     History {Add pertinent medical, surgical, social history, OB history to HPI:1} Chief Complaint  Patient presents with   Sickle Cell Pain Crisis    Austin [name] is a 32 y.o. male.  31 year old male with a history of sickle cell anemia presents to the emergency department complaining of persistent, ongoing pain.  States that he has pain in his bilateral hips and knees as well as his back.  States he has had difficulty taking his medications due to ongoing vomiting.  Has been using Zofran without relief.  Denies fevers, chest pain, shortness of breath, abdominal pain. Was discharged from the hospital yesterday after 24-hour admission for pain control associated with sickle cell anemia.  The history is provided by the patient. No language interpreter was used.  Sickle Cell Pain Crisis      Home Medications Prior to Admission medications   Medication Sig Start Date End Date Taking? Authorizing Provider  dicyclomine (BENTYL) 20 MG tablet TAKE 1 TABLET BY MOUTH TWICE A DAY Patient taking differently: Take 20 mg by mouth daily as needed for spasms. 03/25/22   Tresa Garter, MD  folic acid (FOLVITE) 1 MG tablet Take 1 tablet (1 mg total) by mouth daily. 08/12/22 08/12/23  Dorena Dew, FNP  loratadine (CLARITIN) 10 MG tablet Take 1 tablet (10 mg total) by mouth daily. Patient taking differently: Take 10 mg by mouth daily as needed for allergies or rhinitis. 11/13/20   Vevelyn Francois, NP  naproxen (NAPROSYN) 500 MG tablet Take 1 tablet (500 mg total) by mouth 2 (two) times daily with a meal. Patient taking differently: Take 500 mg by mouth 2 (two) times daily as needed for mild pain. 08/12/22   Dorena Dew, FNP  ondansetron (ZOFRAN-ODT) 4 MG disintegrating tablet Dissolve 1 tablet (4 mg total) by mouth every 6 (six) hours as needed for nausea  or vomiting. 10/15/22   Dorena Dew, FNP  Oxycodone HCl 10 MG TABS Take 1 tablet (10 mg total) by mouth every 6 (six) hours as needed. 10/15/22   Dorena Dew, FNP  sildenafil (VIAGRA) 100 MG tablet Take 100 mg by mouth daily as needed (for E.D.).    [provider]  triamcinolone (NASACORT) 55 MCG/ACT AERO nasal inhaler Place 2 sprays into the nose daily. 2 sprays each nostril at night Patient not taking: Reported on 10/14/2022 04/27/21   Rozetta Nunnery, MD  Vitamin D, Ergocalciferol, (DRISDOL) 1.25 MG (50000 UNIT) CAPS capsule TAKE 1 CAPSULE (50,000 UNITS TOTAL) BY MOUTH ONCE A WEEK. X 12 WEEKS. Patient taking differently: Take 50,000 Units by mouth every Thursday. 04/13/22   Fenton Foy, NP      Allergies    Patient has no known allergies.    Review of Systems   Review of Systems Ten systems reviewed and are negative for acute change, except as noted in the HPI.    Physical Exam Updated Vital Signs BP (!) 134/91 (BP Location: Left Arm)   Pulse 82   Temp 98.1 F (36.7 C) (Oral)   Resp 19   Ht 5\' 7"  (1.702 m)   Wt 74.8 kg   SpO2 100%   BMI 25.84 kg/m   Physical Exam Vitals and nursing note reviewed.  Constitutional:      General: He is not in acute distress.    Appearance: He  is well-developed. He is not diaphoretic.     Comments: Burping, belching. No longer heaving.  HENT:     Head: Normocephalic and atraumatic.  Eyes:     General: No scleral icterus.    Conjunctiva/sclera: Conjunctivae normal.  Cardiovascular:     Rate and Rhythm: Normal rate and regular rhythm.     Pulses: Normal pulses.  Pulmonary:     Effort: Pulmonary effort is normal. No respiratory distress.     Breath sounds: No stridor.     Comments: Respirations even and unlabored Musculoskeletal:        General: Normal range of motion.     Cervical back: Normal range of motion.  Skin:    General: Skin is warm and dry.     Coloration: Skin is not pale.     Findings: No  erythema or rash.  Neurological:     Mental Status: He is alert and oriented to person, place, and time.     Coordination: Coordination normal.  Psychiatric:        Behavior: Behavior normal.     ED Results / Procedures / Treatments   Labs (all labs ordered are listed, but only abnormal results are displayed) Labs Reviewed  COMPREHENSIVE METABOLIC PANEL  CBC WITH DIFFERENTIAL/PLATELET  RETICULOCYTES    EKG None  Radiology No results found.  Procedures Procedures  {Document cardiac monitor, telemetry assessment procedure when appropriate:1}  Medications Ordered in ED Medications  ketorolac (TORADOL) 15 MG/ML injection 15 mg (has no administration in time range)  HYDROmorphone (DILAUDID) injection 2 mg (has no administration in time range)  dextrose 5 %-0.45 % sodium chloride infusion (has no administration in time range)  diphenhydrAMINE (BENADRYL) capsule 25-50 mg (has no administration in time range)  ondansetron (ZOFRAN) injection 4 mg (4 mg Intravenous Given 10/16/22 2141)    ED Course/ Medical Decision Making/ A&P   {   Click here for ABCD2, HEART and other calculatorsREFRESH Note before signing :1}                          Medical Decision Making Risk Prescription drug management.   ***  {Document critical care time when appropriate:1} {Document review of labs and clinical decision tools ie heart score, Chads2Vasc2 etc:1}  {Document your independent review of radiology images, and any outside records:1} {Document your discussion with family members, caretakers, and with consultants:1} {Document social determinants of health affecting pt's care:1} {Document your decision making why or why not admission, treatments were needed:1} Final Clinical Impression(s) / ED Diagnoses Final diagnoses:  None    Rx / DC Orders ED Discharge Orders     None

## 2022-10-16 NOTE — ED Provider Triage Note (Signed)
Emergency Medicine Provider Triage Evaluation Note  Austin [name] , a 32 y.o. male  was evaluated in triage.  Pt complains of sickle cell pain crisis.  Patient has pain in his hip and back.  He was recently discharged from hospital for sickle cell pain crisis.  He received 50 mcg fentanyl and 4 mg Zofran PTA from EMS.  He continues to have dry heaving and vomiting.    Review of Systems  Positive: As above Negative: As above  Physical Exam  BP (!) 134/91 (BP Location: Left Arm)   Pulse 82   Temp 98.1 F (36.7 C) (Oral)   Resp 19   Ht 5\' 7"  (1.702 m)   Wt 74.8 kg   SpO2 100%   BMI 25.84 kg/m  Gen:   Awake, no distress   Resp:  Normal effort  MSK:   Moves slowly from stretcher to wheelchair, states his legs are very painful Other:  Dry heaving and vomiting during triage  Medical Decision Making  Medically screening exam initiated at 9:26 PM.  Appropriate orders placed.  Austin [name] was informed that the remainder of the evaluation will be completed by another provider, this initial triage assessment does not replace that evaluation, and the importance of remaining in the ED until their evaluation is complete.     Theressa Stamps R, [place] 10/16/22 2128

## 2022-10-17 MED ORDER — OXYCODONE HCL 5 MG PO TABS
10.0000 mg | ORAL_TABLET | Freq: Four times a day (QID) | ORAL | Status: AC | PRN
  Administered 2022-10-17: 10 mg via ORAL
  Filled 2022-10-17: qty 2

## 2022-10-17 MED ORDER — PROMETHAZINE HCL 25 MG PO TABS: 25.0000 mg | ORAL_TABLET | Freq: Four times a day (QID) | ORAL | 0 refills | Status: DC | PRN

## 2022-10-17 NOTE — ED Notes (Signed)
Patient continued to remain upset about being discharged.  States "no one wants to do their job.  They don't care about patients.  I am going to get a Chief Executive Officer.  This is so wrong."

## 2022-10-17 NOTE — ED Notes (Signed)
Patient currently on phone with family member.  Yelling that he has "not been offered a heated blanket, the room is cold, my pain is not being controlled and they are trying to discharge me."  Pt states "I am going to come home and get me some lawyers.  This is bullshit."  Patient has been offered blankets multiple times and patient refused because he brought "my own heated blanket."  Personal heated blanket was plugged in by RN and patient was comfortable prior to being notified that he was going to be discharged.

## 2022-10-17 NOTE — ED Notes (Signed)
Patient resting quietly at this time.  Reports abdominal discomfort has improved, but "I am still having pain in my legs and back."  Patient offered warm blanket.  Informed that temperature of room could not be changed at this time.  Understanding voiced.  Patient wanted his personal "heated" blanket plugged in instead of a warm blanket.  Additional comfort measures addressed.

## 2022-10-17 NOTE — Discharge Instructions (Addendum)
Take Phenergan for nausea management. Continue your prescribed pain medication for management of sickle cell pain. Follow up with your sickle cell doctor for recheck. You have been provided a referral to gastroenterology given your concerns for ongoing vomiting.

## 2022-10-23 ENCOUNTER — Emergency Department (HOSPITAL_COMMUNITY)
Admission: EM | Admit: 2022-10-23 | Discharge: 2022-10-23 | Disposition: A | Discharge status: Home / Self Care | Payer: 59 | Attending: Emergency Medicine | Admitting: Emergency Medicine

## 2022-10-23 ENCOUNTER — Emergency Department (HOSPITAL_COMMUNITY): Payer: 59

## 2022-10-23 ENCOUNTER — Other Ambulatory Visit: Payer: Self-pay

## 2022-10-23 DIAGNOSIS — R112 Nausea with vomiting, unspecified: Secondary | ICD-10-CM | POA: Diagnosis present

## 2022-10-23 DIAGNOSIS — D5700 Hb-SS disease with crisis, unspecified: Secondary | ICD-10-CM | POA: Insufficient documentation

## 2022-10-23 LAB — CBC WITH DIFFERENTIAL/PLATELET
Abs Immature Granulocytes: 0.04 10*3/uL (ref 0.00–0.07)
Basophils Absolute: 0 10*3/uL (ref 0.0–0.1)
Basophils Relative: 0 %
Eosinophils Absolute: 0 10*3/uL (ref 0.0–0.5)
Eosinophils Relative: 0 %
HCT: 38.5 % — ABNORMAL LOW (ref 39.0–52.0)
Hemoglobin: 13.6 g/dL (ref 13.0–17.0)
Immature Granulocytes: 0 %
Lymphocytes Relative: 9 %
Lymphs Abs: 1 10*3/uL (ref 0.7–4.0)
MCH: 27 pg (ref 26.0–34.0)
MCHC: 35.3 g/dL (ref 30.0–36.0)
MCV: 76.5 fL — ABNORMAL LOW (ref 80.0–100.0)
Monocytes Absolute: 0.5 10*3/uL (ref 0.1–1.0)
Monocytes Relative: 5 %
Neutro Abs: 9.1 10*3/uL — ABNORMAL HIGH (ref 1.7–7.7)
Neutrophils Relative %: 86 %
Platelets: 128 10*3/uL — ABNORMAL LOW (ref 150–400)
RBC: 5.03 MIL/uL (ref 4.22–5.81)
RDW: 15 % (ref 11.5–15.5)
WBC: 10.6 10*3/uL — ABNORMAL HIGH (ref 4.0–10.5)
nRBC: 0.3 % — ABNORMAL HIGH (ref 0.0–0.2)

## 2022-10-23 LAB — BASIC METABOLIC PANEL
Anion gap: 7 (ref 5–15)
BUN: 12 mg/dL (ref 6–20)
CO2: 26 mmol/L (ref 22–32)
Calcium: 9.1 mg/dL (ref 8.9–10.3)
Chloride: 100 mmol/L (ref 98–111)
Creatinine, Ser: 0.85 mg/dL (ref 0.61–1.24)
GFR, Estimated: >60 mL/min (ref >60)
Glucose, Bld: 121 mg/dL — ABNORMAL HIGH (ref 70–99)
Potassium: 4 mmol/L (ref 3.5–5.1)
Sodium: 133 mmol/L — ABNORMAL LOW (ref 135–145)

## 2022-10-23 LAB — HEPATIC FUNCTION PANEL
ALT: 15 U/L (ref 0–44)
AST: 10 U/L — ABNORMAL LOW (ref 15–41)
Albumin: 4.9 g/dL (ref 3.5–5.0)
Alkaline Phosphatase: 44 U/L (ref 38–126)
Bilirubin, Direct: 0.3 mg/dL — ABNORMAL HIGH (ref 0.0–0.2)
Indirect Bilirubin: 1.6 mg/dL — ABNORMAL HIGH (ref 0.3–0.9)
Total Bilirubin: 1.9 mg/dL — ABNORMAL HIGH (ref 0.3–1.2)
Total Protein: 7.9 g/dL (ref 6.5–8.1)

## 2022-10-23 LAB — RAPID URINE DRUG SCREEN, HOSP PERFORMED
Amphetamines: NOT DETECTED
Barbiturates: NOT DETECTED
Benzodiazepines: NOT DETECTED
Cocaine: NOT DETECTED
Opiates: NOT DETECTED
Tetrahydrocannabinol: POSITIVE — AB

## 2022-10-23 LAB — RETICULOCYTES
Immature Retic Fract: 19.1 % — ABNORMAL HIGH (ref 2.3–15.9)
RBC.: 5.14 MIL/uL (ref 4.22–5.81)
Retic Count, Absolute: 208.2 10*3/uL — ABNORMAL HIGH (ref 19.0–186.0)
Retic Ct Pct: 4.1 % — ABNORMAL HIGH (ref 0.4–3.1)

## 2022-10-23 LAB — LIPASE, BLOOD: Lipase: 28 U/L (ref 11–51)

## 2022-10-23 MED ORDER — KETOROLAC TROMETHAMINE 15 MG/ML IJ SOLN: 15.0000 mg | INTRAMUSCULAR | Status: DC

## 2022-10-23 MED ORDER — SODIUM CHLORIDE 0.45 % IV SOLN: INTRAVENOUS | Status: DC

## 2022-10-23 MED ORDER — HYDROMORPHONE HCL 2 MG/ML IJ SOLN
2.0000 mg | INTRAMUSCULAR | Status: AC
  Administered 2022-10-23: 2 mg via INTRAVENOUS
  Filled 2022-10-23: qty 1

## 2022-10-23 MED ORDER — DROPERIDOL 2.5 MG/ML IJ SOLN
2.5000 mg | Freq: Once | INTRAMUSCULAR | Status: AC
  Administered 2022-10-23: 2.5 mg via INTRAVENOUS
  Filled 2022-10-23: qty 2

## 2022-10-23 MED ORDER — IOHEXOL 300 MG/ML  SOLN
100.0000 mL | Freq: Once | INTRAMUSCULAR | Status: AC | PRN
  Administered 2022-10-23: 100 mL via INTRAVENOUS

## 2022-10-23 MED ORDER — PROMETHAZINE HCL 25 MG PO TABS: 25.0000 mg | ORAL_TABLET | Freq: Four times a day (QID) | ORAL | 0 refills | Status: DC | PRN

## 2022-10-23 MED ORDER — SODIUM CHLORIDE 0.9 % IV SOLN
12.5000 mg | Freq: Once | INTRAVENOUS | Status: AC
  Administered 2022-10-23: 12.5 mg via INTRAVENOUS
  Filled 2022-10-23: qty 12.5

## 2022-10-23 MED ORDER — FAMOTIDINE IN NACL 20-0.9 MG/50ML-% IV SOLN
20.0000 mg | Freq: Once | INTRAVENOUS | Status: AC
  Administered 2022-10-23: 20 mg via INTRAVENOUS
  Filled 2022-10-23: qty 50

## 2022-10-23 MED ORDER — ONDANSETRON HCL 4 MG/2ML IJ SOLN: 4.0000 mg | INTRAMUSCULAR | Status: DC | PRN

## 2022-10-23 NOTE — ED Notes (Signed)
Patient transported to CT 

## 2022-10-23 NOTE — Discharge Instructions (Addendum)
Your CT scan was reassuring.  Your pain was controlled with pain medications in the emergency department.  Recommend you continue to take antiemetics outpatient, abstain from marijuana use, follow-up outpatient with GI and your sickle cell team.

## 2022-10-23 NOTE — ED Provider Notes (Signed)
[place] EMERGENCY DEPARTMENT AT [facility] Provider Note   CSN: [id number] Arrival date & time: 10/23/22  1007     History {Add pertinent medical, surgical, social history, OB history to HPI:1} Chief Complaint  Patient presents with   Emesis    Austin [name] is a 32 y.o. male.   Emesis      Home Medications Prior to Admission medications   Medication Sig Start Date End Date Taking? Authorizing Provider  dicyclomine (BENTYL) 20 MG tablet TAKE 1 TABLET BY MOUTH TWICE A DAY Patient taking differently: Take 20 mg by mouth daily as needed for spasms. 03/25/22   Tresa Garter, MD  folic acid (FOLVITE) 1 MG tablet Take 1 tablet (1 mg total) by mouth daily. 08/12/22 08/12/23  Dorena Dew, FNP  loratadine (CLARITIN) 10 MG tablet Take 1 tablet (10 mg total) by mouth daily. Patient taking differently: Take 10 mg by mouth daily as needed for allergies or rhinitis. 11/13/20   Vevelyn Francois, NP  naproxen (NAPROSYN) 500 MG tablet Take 1 tablet (500 mg total) by mouth 2 (two) times daily with a meal. Patient taking differently: Take 500 mg by mouth 2 (two) times daily as needed for mild pain. 08/12/22   Dorena Dew, FNP  ondansetron (ZOFRAN-ODT) 4 MG disintegrating tablet Dissolve 1 tablet (4 mg total) by mouth every 6 (six) hours as needed for nausea or vomiting. 10/15/22   Dorena Dew, FNP  Oxycodone HCl 10 MG TABS Take 1 tablet (10 mg total) by mouth every 6 (six) hours as needed. 10/15/22   Dorena Dew, FNP  promethazine (PHENERGAN) 25 MG tablet Take 1 tablet (25 mg total) by mouth every 6 (six) hours as needed for nausea or vomiting. 10/17/22   Antonietta Breach, PA-C  sildenafil (VIAGRA) 100 MG tablet Take 100 mg by mouth daily as needed (for E.D.).    [provider]  triamcinolone (NASACORT) 55 MCG/ACT AERO nasal inhaler Place 2 sprays into the nose daily. 2 sprays each nostril at night Patient not taking: Reported on 10/14/2022 04/27/21    Rozetta Nunnery, MD  Vitamin D, Ergocalciferol, (DRISDOL) 1.25 MG (50000 UNIT) CAPS capsule TAKE 1 CAPSULE (50,000 UNITS TOTAL) BY MOUTH ONCE A WEEK. X 12 WEEKS. Patient taking differently: Take 50,000 Units by mouth every Thursday. 04/13/22   Fenton Foy, NP      Allergies    Patient has no known allergies.    Review of Systems   Review of Systems  Gastrointestinal:  Positive for vomiting.    Physical Exam Updated Vital Signs BP (!) 147/88 (BP Location: Right Arm)   Pulse 74   Temp (!) 97.3 F (36.3 C) (Oral)   Ht 5\' 7"  (1.702 m)   Wt 74.8 kg   SpO2 100%   BMI 25.84 kg/m  Physical Exam  ED Results / Procedures / Treatments   Labs (all labs ordered are listed, but only abnormal results are displayed) Labs Reviewed - No data to display  EKG None  Radiology No results found.  Procedures Procedures  {Document cardiac monitor, telemetry assessment procedure when appropriate:1}  Medications Ordered in ED Medications - No data to display  ED Course/ Medical Decision Making/ A&P   {   Click here for ABCD2, HEART and other calculatorsREFRESH Note before signing :1}                          Medical  Decision Making  ***  {Document critical care time when appropriate:1} {Document review of labs and clinical decision tools ie heart score, Chads2Vasc2 etc:1}  {Document your independent review of radiology images, and any outside records:1} {Document your discussion with family members, caretakers, and with consultants:1} {Document social determinants of health affecting pt's care:1} {Document your decision making why or why not admission, treatments were needed:1} Final Clinical Impression(s) / ED Diagnoses Final diagnoses:  None    Rx / DC Orders ED Discharge Orders     None

## 2022-10-23 NOTE — ED Notes (Signed)
Patient has NS going at time of arrival, has received 200 cc's

## 2022-10-23 NOTE — ED Triage Notes (Addendum)
Patient BIB [facility] C/o vomiting since last night Says vomiting is causing pain now all over body Patient reports 50-100 emesis episodes Described as dark, bloody, brown Pain 10/10  Patient reports this happened last week also, was supposed to see GI but has not been yet Says vomiting has now induced sickle cell crisis

## 2022-11-01 ENCOUNTER — Other Ambulatory Visit (HOSPITAL_COMMUNITY): Payer: Self-pay

## 2022-11-02 ENCOUNTER — Other Ambulatory Visit (HOSPITAL_BASED_OUTPATIENT_CLINIC_OR_DEPARTMENT_OTHER): Payer: Self-pay

## 2022-11-02 ENCOUNTER — Other Ambulatory Visit (HOSPITAL_COMMUNITY): Payer: Self-pay

## 2022-11-10 ENCOUNTER — Other Ambulatory Visit: Payer: Self-pay | Admitting: Family Medicine

## 2022-11-10 NOTE — Telephone Encounter (Signed)
Please advise due to [place] being out of the office . KH 

## 2022-11-15 ENCOUNTER — Other Ambulatory Visit: Payer: Self-pay | Admitting: Family Medicine

## 2022-11-15 DIAGNOSIS — D571 Sickle-cell disease without crisis: Secondary | ICD-10-CM

## 2022-11-16 ENCOUNTER — Other Ambulatory Visit (HOSPITAL_COMMUNITY): Payer: Self-pay

## 2022-11-16 ENCOUNTER — Other Ambulatory Visit: Payer: Self-pay

## 2022-11-16 ENCOUNTER — Other Ambulatory Visit: Payer: Self-pay | Admitting: Family Medicine

## 2022-11-16 DIAGNOSIS — D571 Sickle-cell disease without crisis: Secondary | ICD-10-CM

## 2022-11-16 MED ORDER — SILDENAFIL CITRATE 100 MG PO TABS
100.0000 mg | ORAL_TABLET | Freq: Every day | ORAL | 0 refills | Status: DC | PRN
  Filled 2022-11-16: qty 10, 10d supply, fill #0

## 2022-11-16 MED ORDER — OXYCODONE HCL 10 MG PO TABS
10.0000 mg | ORAL_TABLET | Freq: Four times a day (QID) | ORAL | 0 refills | Status: DC | PRN
  Filled 2022-11-16: qty 60, 15d supply, fill #0

## 2022-11-16 NOTE — Telephone Encounter (Signed)
From: [name] To: Office of Julianne Handler, [place] Sent: 11/15/2022 4:14 PM EDT Subject: Medication Renewal Request  Refills have been requested for the following medications:   sildenafil (VIAGRA) 100 MG tablet  Preferred pharmacy: [facility] Delivery method: Pickup Preferred pick-up date and time: 11/17/2022 8:30 AM

## 2022-11-16 NOTE — Telephone Encounter (Signed)
Please advise KH 

## 2022-11-16 NOTE — Progress Notes (Signed)
Reviewed PDMP substance reporting system prior to prescribing opiate medications. No inconsistencies noted.  Meds ordered this encounter  Medications   sildenafil (VIAGRA) 100 MG tablet    Sig: Take 1 tablet (100 mg total) by mouth daily as needed (for E.D.).    Dispense:  10 tablet    Refill:  0    Order Specific Question:   Supervising Provider    Answer:   Quentin Angst [[id number]]   Oxycodone HCl 10 MG TABS    Sig: Take 1 tablet (10 mg total) by mouth every 6 (six) hours as needed.    Dispense:  60 tablet    Refill:  0    Order Specific Question:   Supervising Provider    Answer:   Quentin Angst [[id number]]   Nolon Nations  APRN, MSN, FNP-C Patient Care [facility] [address] [place], [place] [address] [phone]

## 2022-11-29 ENCOUNTER — Other Ambulatory Visit: Payer: Self-pay | Admitting: Family Medicine

## 2022-11-29 DIAGNOSIS — D571 Sickle-cell disease without crisis: Secondary | ICD-10-CM

## 2022-11-30 ENCOUNTER — Other Ambulatory Visit: Payer: Self-pay | Admitting: Family Medicine

## 2022-11-30 ENCOUNTER — Other Ambulatory Visit (HOSPITAL_COMMUNITY): Payer: Self-pay

## 2022-11-30 DIAGNOSIS — D571 Sickle-cell disease without crisis: Secondary | ICD-10-CM

## 2022-11-30 MED ORDER — OXYCODONE HCL 10 MG PO TABS
10.0000 mg | ORAL_TABLET | Freq: Four times a day (QID) | ORAL | 0 refills | Status: DC | PRN
  Filled 2022-12-01: qty 60, 15d supply, fill #0

## 2022-11-30 NOTE — Progress Notes (Signed)
Reviewed PDMP substance reporting system prior to prescribing opiate medications. No inconsistencies noted.  Meds ordered this encounter  Medications   Oxycodone HCl 10 MG TABS    Sig: Take 1 tablet (10 mg total) by mouth every 6 (six) hours as needed.    Dispense:  60 tablet    Refill:  0    Order Specific Question:   Supervising Provider    Answer:   JEGEDE, OLUGBEMIGA E [[id number]]   Amberly Livas Moore Lexianna Weinrich  APRN, MSN, FNP-C [facility] Care [facility] [address]  , [place] [address] [phone]  

## 2022-11-30 NOTE — Telephone Encounter (Signed)
Please advise KH 

## 2022-12-01 ENCOUNTER — Other Ambulatory Visit: Payer: Self-pay

## 2022-12-01 ENCOUNTER — Other Ambulatory Visit (HOSPITAL_COMMUNITY): Payer: Self-pay

## 2022-12-15 ENCOUNTER — Other Ambulatory Visit (HOSPITAL_COMMUNITY): Payer: Self-pay

## 2022-12-15 ENCOUNTER — Other Ambulatory Visit: Payer: Self-pay

## 2022-12-15 ENCOUNTER — Other Ambulatory Visit: Payer: Self-pay | Admitting: Family Medicine

## 2022-12-15 DIAGNOSIS — D571 Sickle-cell disease without crisis: Secondary | ICD-10-CM

## 2022-12-15 MED ORDER — SILDENAFIL CITRATE 100 MG PO TABS
100.0000 mg | ORAL_TABLET | Freq: Every day | ORAL | 0 refills | Status: DC | PRN
  Filled 2022-12-15: qty 10, 10d supply, fill #0

## 2022-12-15 MED ORDER — OXYCODONE HCL 10 MG PO TABS
10.0000 mg | ORAL_TABLET | Freq: Four times a day (QID) | ORAL | 0 refills | Status: DC | PRN
  Filled 2022-12-15: qty 60, 15d supply, fill #0

## 2022-12-16 ENCOUNTER — Other Ambulatory Visit (HOSPITAL_COMMUNITY): Payer: Self-pay

## 2022-12-28 ENCOUNTER — Other Ambulatory Visit: Payer: Self-pay | Admitting: Nurse Practitioner

## 2022-12-28 DIAGNOSIS — D571 Sickle-cell disease without crisis: Secondary | ICD-10-CM

## 2022-12-29 ENCOUNTER — Other Ambulatory Visit: Payer: Self-pay

## 2022-12-29 NOTE — Telephone Encounter (Signed)
Please advise Kh 

## 2022-12-30 ENCOUNTER — Other Ambulatory Visit (HOSPITAL_COMMUNITY): Payer: Self-pay

## 2022-12-30 ENCOUNTER — Other Ambulatory Visit: Payer: Self-pay | Admitting: Family Medicine

## 2022-12-30 DIAGNOSIS — D571 Sickle-cell disease without crisis: Secondary | ICD-10-CM

## 2022-12-30 MED ORDER — OXYCODONE HCL 10 MG PO TABS
10.0000 mg | ORAL_TABLET | Freq: Four times a day (QID) | ORAL | 0 refills | Status: DC | PRN
  Filled 2022-12-31: qty 60, 15d supply, fill #0

## 2022-12-30 MED ORDER — SILDENAFIL CITRATE 100 MG PO TABS
100.0000 mg | ORAL_TABLET | Freq: Every day | ORAL | 0 refills | Status: DC | PRN
  Filled 2022-12-30: qty 10, 10d supply, fill #0

## 2022-12-30 NOTE — Progress Notes (Signed)
Reviewed PDMP substance reporting system prior to prescribing opiate medications. No inconsistencies noted.  S Meds ordered this encounter  Medications   Oxycodone HCl 10 MG TABS    Sig: Take 1 tablet (10 mg total) by mouth every 6 (six) hours as needed.    Dispense:  60 tablet    Refill:  0    Order Specific Question:   Supervising Provider    Answer:   Quentin Angst [[id number]]   sildenafil (VIAGRA) 100 MG tablet    Sig: Take 1 tablet (100 mg total) by mouth daily as needed (for E.D.).    Dispense:  10 tablet    Refill:  0    Order Specific Question:   Supervising Provider    Answer:   Quentin Angst [[id number]]   Nolon Nations  APRN, MSN, FNP-C Patient Care [facility] [address] [place], [place] [address] [phone]

## 2022-12-31 ENCOUNTER — Other Ambulatory Visit (HOSPITAL_COMMUNITY): Payer: Self-pay

## 2023-01-01 ENCOUNTER — Other Ambulatory Visit (HOSPITAL_COMMUNITY): Payer: Self-pay

## 2023-01-12 ENCOUNTER — Other Ambulatory Visit (HOSPITAL_COMMUNITY): Payer: Self-pay

## 2023-01-12 ENCOUNTER — Other Ambulatory Visit: Payer: Self-pay | Admitting: Family Medicine

## 2023-01-12 DIAGNOSIS — D571 Sickle-cell disease without crisis: Secondary | ICD-10-CM

## 2023-01-12 MED ORDER — OXYCODONE HCL 10 MG PO TABS
10.0000 mg | ORAL_TABLET | Freq: Four times a day (QID) | ORAL | 0 refills | Status: DC | PRN
  Filled 2023-01-15: qty 60, 15d supply, fill #0

## 2023-01-12 NOTE — Progress Notes (Signed)
Reviewed PDMP substance reporting system prior to prescribing opiate medications. No inconsistencies noted.  Meds ordered this encounter  Medications   Oxycodone HCl 10 MG TABS    Sig: Take 1 tablet (10 mg) by mouth every 6 hours as needed.    Dispense:  60 tablet    Refill:  0    Order Specific Question:   Supervising Provider    Answer:   Quentin Angst [[id number]]   Nolon Nations  APRN, MSN, FNP-C Patient Care [facility] [address] [place], [place] [address] [phone]

## 2023-01-12 NOTE — Telephone Encounter (Signed)
Please advise KH 

## 2023-01-15 ENCOUNTER — Other Ambulatory Visit (HOSPITAL_COMMUNITY): Payer: Self-pay

## 2023-01-26 ENCOUNTER — Other Ambulatory Visit (HOSPITAL_COMMUNITY): Payer: Self-pay

## 2023-01-31 ENCOUNTER — Other Ambulatory Visit: Payer: Self-pay | Admitting: Family Medicine

## 2023-01-31 DIAGNOSIS — D571 Sickle-cell disease without crisis: Secondary | ICD-10-CM

## 2023-01-31 NOTE — Telephone Encounter (Signed)
Please advise KH 

## 2023-02-01 ENCOUNTER — Other Ambulatory Visit: Payer: Self-pay | Admitting: Family Medicine

## 2023-02-01 ENCOUNTER — Other Ambulatory Visit (HOSPITAL_COMMUNITY): Payer: Self-pay

## 2023-02-01 DIAGNOSIS — D571 Sickle-cell disease without crisis: Secondary | ICD-10-CM

## 2023-02-01 MED ORDER — OXYCODONE HCL 10 MG PO TABS
10.0000 mg | ORAL_TABLET | Freq: Four times a day (QID) | ORAL | 0 refills | Status: DC | PRN
Start: 2023-02-01 — End: 2023-02-14
  Filled 2023-02-01: qty 60, 15d supply, fill #0

## 2023-02-01 MED ORDER — SILDENAFIL CITRATE 100 MG PO TABS
100.0000 mg | ORAL_TABLET | Freq: Every day | ORAL | 0 refills | Status: DC | PRN
  Filled 2023-02-01: qty 6, 25d supply, fill #0
  Filled 2023-03-01: qty 4, 20d supply, fill #1

## 2023-02-01 NOTE — Progress Notes (Signed)
Reviewed PDMP substance reporting system prior to prescribing opiate medications. No inconsistencies noted.  Meds ordered this encounter  Medications   Oxycodone HCl 10 MG TABS    Sig: Take 1 tablet (10 mg) by mouth every 6 hours as needed.    Dispense:  60 tablet    Refill:  0    Order Specific Question:   Supervising Provider    Answer:   Quentin Angst [[id number]]   sildenafil (VIAGRA) 100 MG tablet    Sig: Take 1 tablet (100 mg) by mouth daily as needed (for E.D.).    Dispense:  10 tablet    Refill:  0    Order Specific Question:   Supervising Provider    Answer:   Quentin Angst [[id number]]   Nolon Nations  APRN, MSN, FNP-C Patient Care [facility] [address] [place], [place] [address] [phone]

## 2023-02-02 ENCOUNTER — Other Ambulatory Visit (HOSPITAL_COMMUNITY): Payer: Self-pay

## 2023-02-02 ENCOUNTER — Other Ambulatory Visit: Payer: Self-pay

## 2023-02-02 ENCOUNTER — Emergency Department (HOSPITAL_COMMUNITY)
Admission: EM | Admit: 2023-02-02 | Discharge: 2023-02-02 | Disposition: A | Discharge status: Home / Self Care | Payer: 59 | Attending: Emergency Medicine | Admitting: Emergency Medicine

## 2023-02-02 ENCOUNTER — Encounter (HOSPITAL_COMMUNITY): Payer: Self-pay | Admitting: Emergency Medicine

## 2023-02-02 DIAGNOSIS — D5700 Hb-SS disease with crisis, unspecified: Secondary | ICD-10-CM | POA: Insufficient documentation

## 2023-02-02 LAB — COMPREHENSIVE METABOLIC PANEL
ALT: 27 U/L (ref 0–44)
AST: 12 U/L — ABNORMAL LOW (ref 15–41)
Albumin: 4.9 g/dL (ref 3.5–5.0)
Alkaline Phosphatase: 59 U/L (ref 38–126)
Anion gap: 7 (ref 5–15)
BUN: 10 mg/dL (ref 6–20)
CO2: 25 mmol/L (ref 22–32)
Calcium: 9.4 mg/dL (ref 8.9–10.3)
Chloride: 101 mmol/L (ref 98–111)
Creatinine, Ser: 1.03 mg/dL (ref 0.61–1.24)
GFR, Estimated: >60 mL/min (ref >60)
Glucose, Bld: 103 mg/dL — ABNORMAL HIGH (ref 70–99)
Potassium: 4 mmol/L (ref 3.5–5.1)
Sodium: 133 mmol/L — ABNORMAL LOW (ref 135–145)
Total Bilirubin: 2.5 mg/dL — ABNORMAL HIGH (ref 0.3–1.2)
Total Protein: 8.8 g/dL — ABNORMAL HIGH (ref 6.5–8.1)

## 2023-02-02 LAB — RETICULOCYTES
Immature Retic Fract: 19.6 % — ABNORMAL HIGH (ref 2.3–15.9)
RBC.: 4.81 MIL/uL (ref 4.22–5.81)
Retic Count, Absolute: 156.3 10*3/uL (ref 19.0–186.0)
Retic Ct Pct: 3.3 % — ABNORMAL HIGH (ref 0.4–3.1)

## 2023-02-02 LAB — CBC WITH DIFFERENTIAL/PLATELET
Abs Immature Granulocytes: 0.06 10*3/uL (ref 0.00–0.07)
Basophils Absolute: 0 10*3/uL (ref 0.0–0.1)
Basophils Relative: 0 %
Eosinophils Absolute: 0 10*3/uL (ref 0.0–0.5)
Eosinophils Relative: 0 %
HCT: 38 % — ABNORMAL LOW (ref 39.0–52.0)
Hemoglobin: 13.5 g/dL (ref 13.0–17.0)
Immature Granulocytes: 0 %
Lymphocytes Relative: 6 %
Lymphs Abs: 0.9 10*3/uL (ref 0.7–4.0)
MCH: 28 pg (ref 26.0–34.0)
MCHC: 35.5 g/dL (ref 30.0–36.0)
MCV: 78.8 fL — ABNORMAL LOW (ref 80.0–100.0)
Monocytes Absolute: 1.2 10*3/uL — ABNORMAL HIGH (ref 0.1–1.0)
Monocytes Relative: 8 %
Neutro Abs: 13.3 10*3/uL — ABNORMAL HIGH (ref 1.7–7.7)
Neutrophils Relative %: 86 %
Platelets: 115 10*3/uL — ABNORMAL LOW (ref 150–400)
RBC: 4.82 MIL/uL (ref 4.22–5.81)
RDW: 14.4 % (ref 11.5–15.5)
WBC: 15.5 10*3/uL — ABNORMAL HIGH (ref 4.0–10.5)
nRBC: 0.1 % (ref 0.0–0.2)

## 2023-02-02 MED ORDER — HYDROMORPHONE HCL 1 MG/ML IJ SOLN
1.0000 mg | INTRAMUSCULAR | Status: AC
  Administered 2023-02-02: 1 mg via INTRAVENOUS
  Filled 2023-02-02: qty 1

## 2023-02-02 MED ORDER — ONDANSETRON HCL 4 MG/2ML IJ SOLN
4.0000 mg | INTRAMUSCULAR | Status: DC | PRN
  Administered 2023-02-02: 4 mg via INTRAVENOUS
  Filled 2023-02-02: qty 2

## 2023-02-02 MED ORDER — PROMETHAZINE HCL 25 MG PO TABS: 25.0000 mg | ORAL_TABLET | Freq: Four times a day (QID) | ORAL | 0 refills | Status: DC | PRN

## 2023-02-02 MED ORDER — SODIUM CHLORIDE 0.45 % IV SOLN: INTRAVENOUS | Status: AC

## 2023-02-02 MED ORDER — DIPHENHYDRAMINE HCL 25 MG PO CAPS: 25.0000 mg | ORAL_CAPSULE | ORAL | Status: DC | PRN

## 2023-02-02 MED ORDER — HYDROMORPHONE HCL 2 MG/ML IJ SOLN
2.0000 mg | INTRAMUSCULAR | Status: AC
  Administered 2023-02-02: 2 mg via INTRAVENOUS
  Filled 2023-02-02: qty 1

## 2023-02-02 MED ORDER — KETOROLAC TROMETHAMINE 15 MG/ML IJ SOLN
15.0000 mg | Freq: Once | INTRAMUSCULAR | Status: AC
  Administered 2023-02-02: 15 mg via INTRAVENOUS
  Filled 2023-02-02: qty 1

## 2023-02-02 NOTE — ED Triage Notes (Signed)
Pt endorses chest pain and side pain starting this morning after work. Reports sickle cell crisis.

## 2023-02-02 NOTE — ED Provider Notes (Signed)
[place] EMERGENCY DEPARTMENT AT [facility] Provider Note   CSN: [id number] Arrival date & time: 02/02/23  1648     History  Chief Complaint  Patient presents with   Sickle Cell Pain Crisis    Austin [name] is a 32 y.o. male.  HPI     1 old male comes in with chief complaint of sickle cell pain.  Patient states that he started having lower extremity pain, back pain, chest pain yesterday.  He took his last as needed oxycodone yesterday.  Pain is severe, therefore he decided to come to the emergency room.  His pain started to work, he has been drinking water but he suspects heat at work might of contributed to his pain.  Pain overall similar to his sickle cell pain.  Patient denies any cough, fevers, chills, dizziness or near fainting.  Home Medications Prior to Admission medications   Medication Sig Start Date End Date Taking? Authorizing Provider  promethazine (PHENERGAN) 25 MG tablet Take 1 tablet (25 mg total) by mouth every 6 (six) hours as needed for nausea or vomiting. 02/02/23  Yes Yves Fodor, MD  dicyclomine (BENTYL) 20 MG tablet TAKE 1 TABLET BY MOUTH TWICE A DAY Patient taking differently: Take 20 mg by mouth daily as needed for spasms. 03/25/22   Quentin Angst, MD  folic acid (FOLVITE) 1 MG tablet Take 1 tablet (1 mg total) by mouth daily. 08/12/22 08/12/23  Massie Maroon, FNP  loratadine (CLARITIN) 10 MG tablet Take 1 tablet (10 mg total) by mouth daily. Patient taking differently: Take 10 mg by mouth daily as needed for allergies or rhinitis. 11/13/20   Barbette Merino, NP  naproxen (NAPROSYN) 500 MG tablet TAKE 1 TABLET BY MOUTH 2 TIMES DAILY WITH A MEAL. 11/10/22   Ivonne Andrew, NP  ondansetron (ZOFRAN-ODT) 4 MG disintegrating tablet Dissolve 1 tablet (4 mg total) by mouth every 6 (six) hours as needed for nausea or vomiting. 10/15/22   Massie Maroon, FNP  Oxycodone HCl 10 MG TABS Take 1 tablet (10 mg) by mouth every 6 hours as  needed. 02/01/23   Massie Maroon, FNP  sildenafil (VIAGRA) 100 MG tablet Take 1 tablet (100 mg) by mouth daily as needed (for E.D.). 02/01/23   Massie Maroon, FNP  triamcinolone (NASACORT) 55 MCG/ACT AERO nasal inhaler Place 2 sprays into the nose daily. 2 sprays each nostril at night Patient not taking: Reported on 10/14/2022 04/27/21   Drema Halon, MD  Vitamin D, Ergocalciferol, (DRISDOL) 1.25 MG (50000 UNIT) CAPS capsule TAKE 1 CAPSULE (50,000 UNITS TOTAL) BY MOUTH ONCE A WEEK. X 12 WEEKS. Patient taking differently: Take 50,000 Units by mouth every Thursday. 04/13/22   Ivonne Andrew, NP      Allergies    Patient has no known allergies.    Review of Systems   Review of Systems  All other systems reviewed and are negative.   Physical Exam Updated Vital Signs BP 117/75   Pulse 82   Temp 97.7 F (36.5 C) (Oral)   Resp 16   Ht 5' 7.5" (1.715 m)   Wt 76.4 kg   SpO2 100%   BMI 26.00 kg/m  Physical Exam Vitals and nursing note reviewed.  Constitutional:      Appearance: He is well-developed.  HENT:     Head: Atraumatic.  Cardiovascular:     Rate and Rhythm: Normal rate.  Pulmonary:     Effort: Pulmonary effort is  normal.  Musculoskeletal:     Cervical back: Neck supple.     Right lower leg: No edema.     Left lower leg: No edema.  Skin:    General: Skin is warm.  Neurological:     Mental Status: He is alert and oriented to person, place, and time.     ED Results / Procedures / Treatments   Labs (all labs ordered are listed, but only abnormal results are displayed) Labs Reviewed  CBC WITH DIFFERENTIAL/PLATELET - Abnormal; Notable for the following components:      Result Value   WBC 15.5 (*)    HCT 38.0 (*)    MCV 78.8 (*)    Platelets 115 (*)    Neutro Abs 13.3 (*)    Monocytes Absolute 1.2 (*)    All other components within normal limits  RETICULOCYTES - Abnormal; Notable for the following components:   Retic Ct Pct 3.3 (*)    Immature Retic  Fract 19.6 (*)    All other components within normal limits  COMPREHENSIVE METABOLIC PANEL - Abnormal; Notable for the following components:   Sodium 133 (*)    Glucose, Bld 103 (*)    Total Protein 8.8 (*)    AST 12 (*)    Total Bilirubin 2.5 (*)    All other components within normal limits    EKG EKG Interpretation Date/Time:  Wednesday February 02 2023 17:03:45 EDT Ventricular Rate:  79 PR Interval:  120 QRS Duration:  87 QT Interval:  340 QTC Calculation: 390 R Axis:   83  Text Interpretation: Sinus rhythm ST elev, probable normal early repol pattern No acute changes No significant change since last tracing Confirmed by Derwood Kaplan [phone]) on 02/02/2023 6:01:53 PM  Radiology No results found.  Procedures Procedures    Medications Ordered in ED Medications  0.45 % sodium chloride infusion ( Intravenous New Bag/Given 02/02/23 1901)  diphenhydrAMINE (BENADRYL) capsule 25-50 mg (has no administration in time range)  ondansetron (ZOFRAN) injection 4 mg (4 mg Intravenous Given 02/02/23 1935)  HYDROmorphone (DILAUDID) injection 2 mg (2 mg Intravenous Given 02/02/23 1859)  HYDROmorphone (DILAUDID) injection 1 mg (1 mg Intravenous Given 02/02/23 1935)  HYDROmorphone (DILAUDID) injection 1 mg (1 mg Intravenous Given 02/02/23 2001)  ketorolac (TORADOL) 15 MG/ML injection 15 mg (15 mg Intravenous Given 02/02/23 2037)    ED Course/ Medical Decision Making/ A&P                             Medical Decision Making Amount and/or Complexity of Data Reviewed Labs: ordered.  Risk Prescription drug management.   Pt comes in with sickle cell related pain.  Patient has history of sickle cell anemia with complications from it including vaso-occlusive crisis in the past. We have reviewed patient's previous ED visits, recent lab results and recent imaging results for this encounter.  Differential diagnosis includes vaso-occlusive pain, occult infection, dehydration, electrolyte  abnormality, hyperbilirubinemia, aplastic anemia.  VSS and WNL -  hemodynamically stable.  History and exam is not indicative of any specific source of infection at this time.  Pain appears vaso-occlusive type and typical of previous pain. Appropriate labs ordered.  Pain control and symptom management initiated while in the ED with parenteral medication. We will reassess patient after multiple doses of analgesics. Goal is to break the pain and see if patient feels comfortable going home.  Currently, there is no signs of severe decompensation clinically. Will  continue to monitor closely.  Admission has been considered for this patient, and we will proceed with this request If we are unable to control the pain, we will admit the patient.   8:51 PM Patient received 2 mg hydromorphone followed by 2 individual rounds of 1 mg hydromorphone.  He feels a lot better now.  He feels he can go home.  IV Toradol ordered.  His labs reveal elevated white count, but no profound anemia.  LFTs are reassuring.  Stable for discharge with return precautions.  Final Clinical Impression(s) / ED Diagnoses Final diagnoses:  Sickle cell anemia with pain (HCC)    Rx / DC Orders ED Discharge Orders          Ordered    promethazine (PHENERGAN) 25 MG tablet  Every 6 hours PRN        02/02/23 2043              Derwood Kaplan, MD 02/02/23 2051

## 2023-02-08 ENCOUNTER — Emergency Department (HOSPITAL_COMMUNITY)
Admission: EM | Admit: 2023-02-08 | Discharge: 2023-02-08 | Disposition: A | Discharge status: Home / Self Care | Payer: 59 | Attending: Emergency Medicine | Admitting: Emergency Medicine

## 2023-02-08 ENCOUNTER — Other Ambulatory Visit: Payer: Self-pay

## 2023-02-08 DIAGNOSIS — D5700 Hb-SS disease with crisis, unspecified: Secondary | ICD-10-CM | POA: Diagnosis present

## 2023-02-08 LAB — COMPREHENSIVE METABOLIC PANEL
ALT: 18 U/L (ref 0–44)
AST: 10 U/L — ABNORMAL LOW (ref 15–41)
Albumin: 4.2 g/dL (ref 3.5–5.0)
Alkaline Phosphatase: 60 U/L (ref 38–126)
Anion gap: 7 (ref 5–15)
BUN: 13 mg/dL (ref 6–20)
CO2: 25 mmol/L (ref 22–32)
Calcium: 9.1 mg/dL (ref 8.9–10.3)
Chloride: 102 mmol/L (ref 98–111)
Creatinine, Ser: 0.82 mg/dL (ref 0.61–1.24)
GFR, Estimated: >60 mL/min (ref >60)
Glucose, Bld: 104 mg/dL — ABNORMAL HIGH (ref 70–99)
Potassium: 3.7 mmol/L (ref 3.5–5.1)
Sodium: 134 mmol/L — ABNORMAL LOW (ref 135–145)
Total Bilirubin: 1.7 mg/dL — ABNORMAL HIGH (ref 0.3–1.2)
Total Protein: 8.1 g/dL (ref 6.5–8.1)

## 2023-02-08 LAB — RETICULOCYTES
Immature Retic Fract: 17.1 % — ABNORMAL HIGH (ref 2.3–15.9)
RBC.: 4.07 MIL/uL — ABNORMAL LOW (ref 4.22–5.81)
Retic Count, Absolute: 92.4 10*3/uL (ref 19.0–186.0)
Retic Ct Pct: 2.3 % (ref 0.4–3.1)

## 2023-02-08 LAB — CBC WITH DIFFERENTIAL/PLATELET
Abs Immature Granulocytes: 0.07 10*3/uL (ref 0.00–0.07)
Basophils Absolute: 0.1 10*3/uL (ref 0.0–0.1)
Basophils Relative: 0 %
Eosinophils Absolute: 0.2 10*3/uL (ref 0.0–0.5)
Eosinophils Relative: 1 %
HCT: 31.3 % — ABNORMAL LOW (ref 39.0–52.0)
Hemoglobin: 11.2 g/dL — ABNORMAL LOW (ref 13.0–17.0)
Immature Granulocytes: 1 %
Lymphocytes Relative: 16 %
Lymphs Abs: 2.4 10*3/uL (ref 0.7–4.0)
MCH: 27.7 pg (ref 26.0–34.0)
MCHC: 35.8 g/dL (ref 30.0–36.0)
MCV: 77.3 fL — ABNORMAL LOW (ref 80.0–100.0)
Monocytes Absolute: 1.4 10*3/uL — ABNORMAL HIGH (ref 0.1–1.0)
Monocytes Relative: 9 %
Neutro Abs: 11.1 10*3/uL — ABNORMAL HIGH (ref 1.7–7.7)
Neutrophils Relative %: 73 %
Platelets: 148 10*3/uL — ABNORMAL LOW (ref 150–400)
RBC: 4.05 MIL/uL — ABNORMAL LOW (ref 4.22–5.81)
RDW: 14.6 % (ref 11.5–15.5)
WBC: 15.2 10*3/uL — ABNORMAL HIGH (ref 4.0–10.5)
nRBC: 0 % (ref 0.0–0.2)

## 2023-02-08 MED ORDER — ONDANSETRON HCL 4 MG/2ML IJ SOLN
4.0000 mg | INTRAMUSCULAR | Status: DC | PRN
  Administered 2023-02-08: 4 mg via INTRAVENOUS
  Filled 2023-02-08: qty 2

## 2023-02-08 MED ORDER — HYDROMORPHONE HCL 2 MG/ML IJ SOLN
2.0000 mg | INTRAMUSCULAR | Status: AC
  Administered 2023-02-08: 2 mg via INTRAVENOUS
  Filled 2023-02-08: qty 1

## 2023-02-08 MED ORDER — SODIUM CHLORIDE 0.45 % IV SOLN: INTRAVENOUS | Status: DC

## 2023-02-08 MED ORDER — SODIUM CHLORIDE 0.9 % IV SOLN
12.5000 mg | Freq: Once | INTRAVENOUS | Status: AC
  Administered 2023-02-08: 12.5 mg via INTRAVENOUS
  Filled 2023-02-08: qty 12.5

## 2023-02-08 NOTE — ED Triage Notes (Signed)
Pt reports lower back, bilateral legs and ankle pain from [facility] since 0400

## 2023-02-08 NOTE — ED Provider Notes (Signed)
I saw and evaluated the patient, reviewed the resident's note and I agree with the findings and plan.      32 year old male presents with pain consistent with a sickle cell crisis.  Pain is in his usual positions.  Notes increased stress recently.  No concern for acute chest.  Will medicate with fluids, opiates and reassess   Lorre Nick, MD 02/08/23 [phone]

## 2023-02-08 NOTE — ED Provider Notes (Signed)
[place] EMERGENCY DEPARTMENT AT [facility] Provider Note   CSN: [id number] Arrival date & time: 02/08/23  0746     History Chief Complaint  Patient presents with   Sickle Cell Pain Crisis     Austin [name] is a 32 y.o. male presenting for sickle cell pain crisis. He reports pain started 4 AM this morning.  pain in his lower back, knees and ankles. Denies swelling.  He took oxycodone at home, and when he did not get relief after 2 hours, he decided to come to the ED. He denies cough, shortness of breath, fever.  Of note he was seen for similar episode about a week ago.  Patient's recorded medical, surgical, social, medication list and allergies were reviewed in the Snapshot window as part of the initial history.   Review of Systems   Review of Systems  Physical Exam Updated Vital Signs BP 109/74 (BP Location: Left Arm)   Pulse 73   Temp 98.3 F (36.8 C) (Oral)   Resp 18   Ht 5' 7.5" (1.715 m)   Wt 76 kg   SpO2 100%   BMI 25.85 kg/m  Physical Exam General: Pleasant, not in acute distress. CV: RRR. No murmurs, rubs, or gallops. No LE edema Pulmonary: Clear lungs bilaterally.  No wheezes. Abdominal: Soft, nontender, nondistended. Normal bowel sounds. Extremities: Palpable pulses. Normal ROM. Skin: Warm and dry. No obvious rash or lesions. MSK: Tender in the lower back, bilateral knees and ankles without swelling.  ED Course/ Medical Decision Making/ A&P  Procedures Procedures   Medications Ordered in ED Medications  0.45 % sodium chloride infusion (has no administration in time range)  HYDROmorphone (DILAUDID) injection 2 mg (has no administration in time range)  HYDROmorphone (DILAUDID) injection 2 mg (has no administration in time range)  HYDROmorphone (DILAUDID) injection 2 mg (has no administration in time range)  diphenhydrAMINE (BENADRYL) 12.5 mg in sodium chloride 0.9 % 50 mL IVPB (has no administration in time range)  ondansetron  (ZOFRAN) injection 4 mg (has no administration in time range)    Medical Decision Making:    Austin [name] is a 32 y.o. male who presented to the ED today with sickle cell pain crisis detailed above.     Patient placed on continuous vitals and telemetry monitoring while in ED which was reviewed periodically.   Complete initial physical exam performed, notably the patient  was stable.  Tender in the lower back, knees.   Reviewed and confirmed nursing documentation for past medical history, family history, social history.    Initial Assessment:   Differential diagnosis includes vaso-occlusive pain vs. Occult infection vs dehydration vs aplastic anemia.  Pain appears vaso-occlusive type and typical of previous pain.  This is most consistent with an acute complicated illness  Initial Plan:  CBC w/ differential, CMP. Retic count  Zofran 4 mg for nausea. Hydromorphone IV 2 mg.  Objective evaluation as below reviewed with plan for close reassessment  Initial Study Results:   Laboratory  All laboratory results reviewed without evidence of clinically relevant pathology.     EKG EKG was reviewed independently. Rate, rhythm, axis, intervals all examined and without medically relevant abnormality. ST segments without concerns for elevations.     Reassessment and Plan:   Patient is feeling well and is ready for discharge.  Pain is much controlled.   Clinical Impression:  1. Sickle cell pain crisis (HCC)      Data Unavailable   Final Clinical Impression(s) /  ED Diagnoses Final diagnoses:  Sickle cell pain crisis [facility])    Rx / DC Orders ED Discharge Orders     None         Laretta Bolster, MD 02/08/23 1947    Lorre Nick, MD 02/09/23 1436

## 2023-02-14 ENCOUNTER — Other Ambulatory Visit: Payer: Self-pay | Admitting: Family Medicine

## 2023-02-14 ENCOUNTER — Other Ambulatory Visit (HOSPITAL_COMMUNITY): Payer: Self-pay

## 2023-02-14 DIAGNOSIS — D571 Sickle-cell disease without crisis: Secondary | ICD-10-CM

## 2023-02-14 MED ORDER — OXYCODONE HCL 10 MG PO TABS
10.0000 mg | ORAL_TABLET | Freq: Four times a day (QID) | ORAL | 0 refills | Status: DC | PRN
Start: 2023-02-16 — End: 2023-03-02
  Filled 2023-02-16: qty 60, 15d supply, fill #0

## 2023-02-14 NOTE — Progress Notes (Signed)
Reviewed PDMP substance reporting system prior to prescribing opiate medications. No inconsistencies noted.  Meds ordered this encounter  Medications   Oxycodone HCl 10 MG TABS    Sig: Take 1 tablet (10 mg) by mouth every 6 hours as needed.    Dispense:  60 tablet    Refill:  0    Order Specific Question:   Supervising Provider    Answer:   Quentin Angst [[id number]]   Nolon Nations  APRN, MSN, FNP-C Patient Care [facility] [address] [place], [place] [address] [phone]

## 2023-02-14 NOTE — Telephone Encounter (Signed)
Please advise KH 

## 2023-02-16 ENCOUNTER — Other Ambulatory Visit (HOSPITAL_COMMUNITY): Payer: Self-pay

## 2023-02-28 ENCOUNTER — Emergency Department (HOSPITAL_COMMUNITY)
Admission: EM | Admit: 2023-02-28 | Discharge: 2023-03-01 | Disposition: A | Discharge status: Home / Self Care | Payer: 59 | Attending: Emergency Medicine | Admitting: Emergency Medicine

## 2023-02-28 ENCOUNTER — Other Ambulatory Visit: Payer: Self-pay

## 2023-02-28 ENCOUNTER — Emergency Department (HOSPITAL_COMMUNITY): Payer: 59

## 2023-02-28 ENCOUNTER — Encounter (HOSPITAL_COMMUNITY): Payer: Self-pay

## 2023-02-28 DIAGNOSIS — D72829 Elevated white blood cell count, unspecified: Secondary | ICD-10-CM | POA: Diagnosis not present

## 2023-02-28 DIAGNOSIS — D5700 Hb-SS disease with crisis, unspecified: Secondary | ICD-10-CM | POA: Diagnosis present

## 2023-02-28 MED ORDER — SODIUM CHLORIDE 0.9 % IV SOLN
12.5000 mg | Freq: Once | INTRAVENOUS | Status: AC
  Administered 2023-02-28: 12.5 mg via INTRAVENOUS
  Filled 2023-02-28: qty 12.5

## 2023-02-28 MED ORDER — HYDROMORPHONE HCL 2 MG/ML IJ SOLN
2.0000 mg | INTRAMUSCULAR | Status: AC
  Administered 2023-03-01: 2 mg via INTRAVENOUS
  Filled 2023-02-28: qty 1

## 2023-02-28 MED ORDER — HYDROMORPHONE HCL 1 MG/ML IJ SOLN
0.5000 mg | INTRAMUSCULAR | Status: DC
  Filled 2023-02-28: qty 1

## 2023-02-28 MED ORDER — ONDANSETRON HCL 4 MG/2ML IJ SOLN
4.0000 mg | INTRAMUSCULAR | Status: DC | PRN
  Administered 2023-02-28: 4 mg via INTRAVENOUS
  Filled 2023-02-28: qty 2

## 2023-02-28 MED ORDER — SODIUM CHLORIDE 0.45 % IV SOLN: INTRAVENOUS | Status: DC

## 2023-02-28 MED ORDER — HYDROMORPHONE HCL 2 MG/ML IJ SOLN
2.0000 mg | INTRAMUSCULAR | Status: AC
  Administered 2023-02-28: 2 mg via INTRAVENOUS
  Filled 2023-02-28: qty 1

## 2023-02-28 MED ORDER — HYDROMORPHONE HCL 1 MG/ML IJ SOLN: 0.5000 mg | INTRAMUSCULAR | Status: DC

## 2023-02-28 NOTE — ED Notes (Signed)
Called lab to add trop  

## 2023-02-28 NOTE — ED Triage Notes (Signed)
Pt presents via POV c/o lower back pain and bilateral knee pain. Hx sickle cell. Reports pain started at 1500 today.

## 2023-03-01 ENCOUNTER — Other Ambulatory Visit: Payer: Self-pay

## 2023-03-01 ENCOUNTER — Other Ambulatory Visit: Payer: Self-pay | Admitting: Family Medicine

## 2023-03-01 ENCOUNTER — Other Ambulatory Visit (HOSPITAL_COMMUNITY): Payer: Self-pay

## 2023-03-01 DIAGNOSIS — D571 Sickle-cell disease without crisis: Secondary | ICD-10-CM

## 2023-03-01 MED ORDER — ONDANSETRON HCL 4 MG PO TABS
4.0000 mg | ORAL_TABLET | Freq: Four times a day (QID) | ORAL | 0 refills | Status: DC
  Filled 2023-03-01: qty 12, 3d supply, fill #0

## 2023-03-01 NOTE — ED Provider Notes (Signed)
[place] EMERGENCY DEPARTMENT AT [facility] Provider Note   CSN: [id number] Arrival date & time: 02/28/23  2212     History  Chief Complaint  Patient presents with   Sickle Cell Pain Crisis    Austin [name] is a 32 y.o. male.  HPI   Patient with medical history including eczema, sickle cell, presenting with complaints of sickle cell pain crisis, started earlier yesterday, started after he got off of work, states she feels pain in his lower back into the legs, states his this is the typical distribution, he denies any trauma to the area, denies any paresthesia or weakness moving down his legs, denies any saddle paresthesias no urinary or bowel incontinence he is, he denies any fevers chills cough congestion, denies any chest pain shortness of breath, states he take his home medication relief.  He has no other complaints.    Home Medications Prior to Admission medications   Medication Sig Start Date End Date Taking? Authorizing Provider  folic acid (FOLVITE) 1 MG tablet Take 1 tablet (1 mg total) by mouth daily. 08/12/22 08/12/23 Yes Massie Maroon, FNP  loratadine (CLARITIN) 10 MG tablet Take 1 tablet (10 mg total) by mouth daily. Patient taking differently: Take 10 mg by mouth daily as needed for allergies or rhinitis. 11/13/20  Yes King, Shana Chute, NP  naproxen (NAPROSYN) 500 MG tablet TAKE 1 TABLET BY MOUTH 2 TIMES DAILY WITH A MEAL. Patient taking differently: Take 500 mg by mouth 2 (two) times daily as needed for moderate pain. 11/10/22  Yes Ivonne Andrew, NP  ondansetron (ZOFRAN) 4 MG tablet Take 1 tablet (4 mg total) by mouth every 6 (six) hours. 03/01/23  Yes Carroll Sage, PA-C  Oxycodone HCl 10 MG TABS Take 1 tablet (10 mg) by mouth every 6 hours as needed. 02/16/23  Yes Massie Maroon, FNP  promethazine (PHENERGAN) 25 MG tablet Take 1 tablet (25 mg total) by mouth every 6 (six) hours as needed for nausea or vomiting. 02/02/23  Yes Derwood Kaplan,  MD  sildenafil (VIAGRA) 100 MG tablet Take 1 tablet (100 mg) by mouth daily as needed (for E.D.). 02/01/23  Yes Massie Maroon, FNP  Vitamin D, Ergocalciferol, (DRISDOL) 1.25 MG (50000 UNIT) CAPS capsule TAKE 1 CAPSULE (50,000 UNITS TOTAL) BY MOUTH ONCE A WEEK. X 12 WEEKS. Patient taking differently: Take 50,000 Units by mouth every Thursday. 04/13/22  Yes Ivonne Andrew, NP  dicyclomine (BENTYL) 20 MG tablet TAKE 1 TABLET BY MOUTH TWICE A DAY Patient not taking: Reported on 02/28/2023 03/25/22   Quentin Angst, MD  ondansetron (ZOFRAN-ODT) 4 MG disintegrating tablet Dissolve 1 tablet (4 mg total) by mouth every 6 (six) hours as needed for nausea or vomiting. 10/15/22   Massie Maroon, FNP  triamcinolone (NASACORT) 55 MCG/ACT AERO nasal inhaler Place 2 sprays into the nose daily. 2 sprays each nostril at night Patient not taking: Reported on 10/14/2022 04/27/21   Drema Halon, MD      Allergies    Patient has no known allergies.    Review of Systems   Review of Systems  Constitutional:  Negative for chills and fever.  Respiratory:  Negative for shortness of breath.   Cardiovascular:  Negative for chest pain.  Gastrointestinal:  Negative for abdominal pain.  Musculoskeletal:  Positive for back pain.  Neurological:  Negative for headaches.    Physical Exam Updated Vital Signs BP 126/76   Pulse 70   Temp  97.8 F (36.6 C)   Resp 13   Ht 5' 7.5" (1.715 m)   Wt 76 kg   SpO2 96%   BMI 25.85 kg/m  Physical Exam Vitals and nursing note reviewed.  Constitutional:      General: He is not in acute distress.    Appearance: He is not ill-appearing.  HENT:     Head: Normocephalic and atraumatic.     Nose: No congestion.  Eyes:     Conjunctiva/sclera: Conjunctivae normal.  Cardiovascular:     Rate and Rhythm: Normal rate and regular rhythm.     Pulses: Normal pulses.     Heart sounds: No murmur heard.    No friction rub. No gallop.  Pulmonary:     Effort: No  respiratory distress.     Breath sounds: No wheezing, rhonchi or rales.  Abdominal:     Palpations: Abdomen is soft.     Tenderness: There is no abdominal tenderness. There is no right CVA tenderness or left CVA tenderness.  Musculoskeletal:     Right lower leg: No edema.     Left lower leg: No edema.     Comments: Spine was palpated was nontender to palpation no step-off deformities noted no pelvis instability no leg shortening, no overlying skin changes, he has 2+ dorsal pedal pulses, sensation tact light touch, he has 5 5 strength in the lower extremities bilaterally.  Skin:    General: Skin is warm and dry.  Neurological:     Mental Status: He is alert.  Psychiatric:        Mood and Affect: Mood normal.     ED Results / Procedures / Treatments   Labs (all labs ordered are listed, but only abnormal results are displayed) Labs Reviewed  COMPREHENSIVE METABOLIC PANEL - Abnormal; Notable for the following components:      Result Value   Potassium 3.3 (*)    Glucose, Bld 110 (*)    Total Protein 8.2 (*)    Total Bilirubin 1.5 (*)    All other components within normal limits  CBC WITH DIFFERENTIAL/PLATELET - Abnormal; Notable for the following components:   WBC 11.3 (*)    Hemoglobin 12.8 (*)    HCT 35.9 (*)    MCV 77.7 (*)    RDW 16.2 (*)    Platelets 124 (*)    nRBC 0.4 (*)    All other components within normal limits  RETICULOCYTES - Abnormal; Notable for the following components:   Retic Ct Pct 4.4 (*)    Retic Count, Absolute 201.1 (*)    Immature Retic Fract 25.8 (*)    All other components within normal limits  TROPONIN I (HIGH SENSITIVITY)  TROPONIN I (HIGH SENSITIVITY)    EKG None  Radiology DG Chest 2 View  Result Date: 02/28/2023 CLINICAL DATA:  Shortness of breath.  History of sickle cell. EXAM: CHEST - 2 VIEW COMPARISON:  10/01/2022 FINDINGS: Stable upper normal heart size.The cardiomediastinal contours are normal. Pulmonary vasculature is normal. No  consolidation, pleural effusion, or pneumothorax. Stable mild osseous sequela of sickle cell. No acute osseous abnormalities are seen. IMPRESSION: No acute chest findings. Electronically Signed   By: Narda Rutherford M.D.   On: 02/28/2023 23:55    Procedures Procedures    Medications Ordered in ED Medications  0.45 % sodium chloride infusion (has no administration in time range)  ondansetron (ZOFRAN) injection 4 mg (4 mg Intravenous Given 02/28/23 2356)  HYDROmorphone (DILAUDID) injection 2 mg (2 mg  Intravenous Given 02/28/23 2346)  HYDROmorphone (DILAUDID) injection 2 mg (2 mg Intravenous Given 03/01/23 0037)  HYDROmorphone (DILAUDID) injection 2 mg (2 mg Intravenous Given 03/01/23 0113)  diphenhydrAMINE (BENADRYL) 12.5 mg in sodium chloride 0.9 % 50 mL IVPB (0 mg Intravenous Stopped 03/01/23 0053)    ED Course/ Medical Decision Making/ A&P                                 Medical Decision Making Amount and/or Complexity of Data Reviewed Labs: ordered. Radiology: ordered.  Risk Prescription drug management.   This patient presents to the ED for concern of sickle cell pain crisis, this involves an extensive number of treatment options, and is a complaint that carries with it a high risk of complications and morbidity.  The differential diagnosis includes acute chest pain syndrome, ACS, PE, aplastic anemia, spinal equina    Additional history obtained:  Additional history obtained from N/A External records from outside source obtained and reviewed including ER notes   Co morbidities that complicate the patient evaluation  Sickle cell disease  Social Determinants of Health:  N/A    Lab Tests:  I Ordered, and personally interpreted labs.  The pertinent results include: CBC shows leukocytosis 11.3, CMP reveals potassium 3.3, glucose 110, first troponin is 3, reticulocyte count elevated,   Imaging Studies ordered:  I ordered imaging studies including chest x-ray I  independently visualized and interpreted imaging which showed unremarkable I agree with the radiologist interpretation   Cardiac Monitoring:  The patient was maintained on a cardiac monitor.  I personally viewed and interpreted the cardiac monitored which showed an underlying rhythm of: Without signs of ischemia   Medicines ordered and prescription drug management:  I ordered medication including pain medication  I have reviewed the patients home medicines and have made adjustments as needed  Critical Interventions:  N/A   Reevaluation:  Presents with sickle cell pain, benign physical exam, will obtain screening lab workup and reassess  On reassessment patient is found resting comfortably, having no complaints, pain is completely resolved, he is agreement discharge at this time.  Consultations Obtained:  N/a    Test Considered:  N/a    Rule out I have low suspicion for ACS as history is atypical, patient has no cardiac history, EKG was sinus rhythm without signs of ischemia, patient had a negative troponin.  Low suspicion for PE as patient denies pleuritic chest pain, shortness of breath, patient denies leg pain, no pedal edema noted on exam, vital signs reassuring nontachypneic nonhypoxic cardio ox requirements.  Low suspicion for AAA or aortic dissection as history is atypical, patient has low risk factors.  Low suspicion for systemic infection as patient is nontoxic-appearing, vital signs reassuring, no obvious source infection noted on exam.  I have low suspicion for spinal fracture or spinal cord abnormality as patient denies urinary incontinency, retention, difficulty with bowel movements, denies saddle paresthesias.  Spine was palpated there is no step-off, crepitus or gross deformities felt, patient had 5/5 strength, full range of motion, neurovascular fully intact in the lower extremities.  Doubt AAA or dissection presentation atypical he has no history of this.  Doubt  aplastic anemia particular counts elevated.  I doubt acute chest pain syndrome no fevers no chills, nontachycardic nontachypneic, chest x-ray is negative for signs of infection.     Dispostion and problem list  After consideration of the diagnostic results and the patients response  to treatment, I feel that the patent would benefit from discharge.  Sickle cell pain crisis-will continue with his home medications, follow-up with sickle cell clinic for further assessment strict return precautions.            Final Clinical Impression(s) / ED Diagnoses Final diagnoses:  Sickle cell pain crisis [facility])    Rx / DC Orders ED Discharge Orders          Ordered    ondansetron (ZOFRAN) 4 MG tablet  Every 6 hours        03/01/23 0150              Carroll Sage, PA-C 03/01/23 0156    Tilden Fossa, MD 03/01/23 [phone]

## 2023-03-01 NOTE — Discharge Instructions (Signed)
Lab workup and imaging were reassuring please continue to take your home medications.  Please follow-up with your sickle cell clinic for further assessment  Come back to the emergency department if you develop chest pain, shortness of breath, severe abdominal pain, uncontrolled nausea, vomiting, diarrhea.

## 2023-03-02 ENCOUNTER — Other Ambulatory Visit (HOSPITAL_COMMUNITY): Payer: Self-pay

## 2023-03-02 ENCOUNTER — Other Ambulatory Visit: Payer: Self-pay

## 2023-03-02 ENCOUNTER — Other Ambulatory Visit: Payer: Self-pay | Admitting: Family Medicine

## 2023-03-02 DIAGNOSIS — D571 Sickle-cell disease without crisis: Secondary | ICD-10-CM

## 2023-03-02 MED ORDER — OXYCODONE HCL 10 MG PO TABS
10.0000 mg | ORAL_TABLET | Freq: Four times a day (QID) | ORAL | 0 refills | Status: DC | PRN
Start: 2023-03-03 — End: 2023-03-17
  Filled 2023-03-03: qty 60, 15d supply, fill #0

## 2023-03-02 NOTE — Progress Notes (Signed)
Reviewed PDMP substance reporting system prior to prescribing opiate medications. No inconsistencies noted.  Meds ordered this encounter  Medications   Oxycodone HCl 10 MG TABS    Sig: Take 1 tablet (10 mg) by mouth every 6 hours as needed.    Dispense:  60 tablet    Refill:  0   Nolon Nations  APRN, MSN, FNP-C [facility] [address] [place], [place] [address] [phone]

## 2023-03-03 ENCOUNTER — Other Ambulatory Visit (HOSPITAL_COMMUNITY): Payer: Self-pay

## 2023-03-17 ENCOUNTER — Other Ambulatory Visit (HOSPITAL_COMMUNITY): Payer: Self-pay

## 2023-03-17 ENCOUNTER — Other Ambulatory Visit: Payer: Self-pay | Admitting: Family Medicine

## 2023-03-17 DIAGNOSIS — D571 Sickle-cell disease without crisis: Secondary | ICD-10-CM

## 2023-03-17 MED ORDER — OXYCODONE HCL 10 MG PO TABS
10.0000 mg | ORAL_TABLET | Freq: Four times a day (QID) | ORAL | 0 refills | Status: DC | PRN
Start: 2023-03-17 — End: 2023-03-30
  Filled 2023-03-17: qty 60, 15d supply, fill #0

## 2023-03-17 NOTE — Telephone Encounter (Signed)
Please advise KH 

## 2023-03-18 ENCOUNTER — Other Ambulatory Visit (HOSPITAL_COMMUNITY): Payer: Self-pay

## 2023-03-23 ENCOUNTER — Other Ambulatory Visit (HOSPITAL_COMMUNITY): Payer: Self-pay | Admitting: Student

## 2023-03-29 ENCOUNTER — Encounter (HOSPITAL_COMMUNITY): Payer: Self-pay | Admitting: Emergency Medicine

## 2023-03-29 ENCOUNTER — Other Ambulatory Visit: Payer: Self-pay | Admitting: Internal Medicine

## 2023-03-29 ENCOUNTER — Other Ambulatory Visit: Payer: Self-pay

## 2023-03-29 ENCOUNTER — Other Ambulatory Visit: Payer: Self-pay | Admitting: Family Medicine

## 2023-03-29 ENCOUNTER — Emergency Department (HOSPITAL_COMMUNITY)
Admission: EM | Admit: 2023-03-29 | Discharge: 2023-03-30 | Disposition: A | Discharge status: Home / Self Care | Payer: 59 | Attending: Emergency Medicine | Admitting: Emergency Medicine

## 2023-03-29 ENCOUNTER — Other Ambulatory Visit (HOSPITAL_COMMUNITY): Payer: Self-pay

## 2023-03-29 DIAGNOSIS — M79604 Pain in right leg: Secondary | ICD-10-CM | POA: Diagnosis present

## 2023-03-29 DIAGNOSIS — D571 Sickle-cell disease without crisis: Secondary | ICD-10-CM

## 2023-03-29 DIAGNOSIS — D5700 Hb-SS disease with crisis, unspecified: Secondary | ICD-10-CM | POA: Diagnosis not present

## 2023-03-29 MED ORDER — SILDENAFIL CITRATE 100 MG PO TABS
100.0000 mg | ORAL_TABLET | Freq: Every day | ORAL | 0 refills | Status: DC | PRN
Start: 2023-03-29 — End: 2023-05-31
  Filled 2023-03-29: qty 6, 25d supply, fill #0
  Filled 2023-04-30: qty 4, 16d supply, fill #1

## 2023-03-29 NOTE — ED Triage Notes (Signed)
Patient coming to ED for evaluation of back pain and bilateral leg pain.  Reports having a sickle cell crisis.  Has tried OTC medications without relief.  Out of nausea medication.  Patient appears uncomfortable.

## 2023-03-30 ENCOUNTER — Other Ambulatory Visit (HOSPITAL_COMMUNITY): Payer: Self-pay

## 2023-03-30 ENCOUNTER — Other Ambulatory Visit: Payer: Self-pay | Admitting: Family Medicine

## 2023-03-30 DIAGNOSIS — D571 Sickle-cell disease without crisis: Secondary | ICD-10-CM

## 2023-03-30 DIAGNOSIS — D5700 Hb-SS disease with crisis, unspecified: Secondary | ICD-10-CM | POA: Diagnosis not present

## 2023-03-30 LAB — CBC WITH DIFFERENTIAL/PLATELET
Abs Immature Granulocytes: 0.03 10*3/uL (ref 0.00–0.07)
Basophils Absolute: 0 10*3/uL (ref 0.0–0.1)
Basophils Relative: 0 %
Eosinophils Absolute: 0.1 10*3/uL (ref 0.0–0.5)
Eosinophils Relative: 1 %
HCT: 32.5 % — ABNORMAL LOW (ref 39.0–52.0)
Hemoglobin: 11.6 g/dL — ABNORMAL LOW (ref 13.0–17.0)
Immature Granulocytes: 0 %
Lymphocytes Relative: 24 %
Lymphs Abs: 1.8 10*3/uL (ref 0.7–4.0)
MCH: 27.9 pg (ref 26.0–34.0)
MCHC: 35.7 g/dL (ref 30.0–36.0)
MCV: 78.1 fL — ABNORMAL LOW (ref 80.0–100.0)
Monocytes Absolute: 0.9 10*3/uL (ref 0.1–1.0)
Monocytes Relative: 12 %
Neutro Abs: 4.6 10*3/uL (ref 1.7–7.7)
Neutrophils Relative %: 63 %
Platelets: 102 10*3/uL — ABNORMAL LOW (ref 150–400)
RBC: 4.16 MIL/uL — ABNORMAL LOW (ref 4.22–5.81)
RDW: 14.9 % (ref 11.5–15.5)
WBC: 7.4 10*3/uL (ref 4.0–10.5)
nRBC: 0.3 % — ABNORMAL HIGH (ref 0.0–0.2)

## 2023-03-30 LAB — RETICULOCYTES
Immature Retic Fract: 25.9 % — ABNORMAL HIGH (ref 2.3–15.9)
RBC.: 4.1 MIL/uL — ABNORMAL LOW (ref 4.22–5.81)
Retic Count, Absolute: 176.3 10*3/uL (ref 19.0–186.0)
Retic Ct Pct: 4.3 % — ABNORMAL HIGH (ref 0.4–3.1)

## 2023-03-30 LAB — COMPREHENSIVE METABOLIC PANEL
ALT: 21 U/L (ref 0–44)
AST: 12 U/L — ABNORMAL LOW (ref 15–41)
Albumin: 4.4 g/dL (ref 3.5–5.0)
Alkaline Phosphatase: 56 U/L (ref 38–126)
Anion gap: 8 (ref 5–15)
BUN: 11 mg/dL (ref 6–20)
CO2: 25 mmol/L (ref 22–32)
Calcium: 9.3 mg/dL (ref 8.9–10.3)
Chloride: 102 mmol/L (ref 98–111)
Creatinine, Ser: 0.92 mg/dL (ref 0.61–1.24)
GFR, Estimated: >60 mL/min (ref >60)
Glucose, Bld: 96 mg/dL (ref 70–99)
Potassium: 3.5 mmol/L (ref 3.5–5.1)
Sodium: 135 mmol/L (ref 135–145)
Total Bilirubin: 1.8 mg/dL — ABNORMAL HIGH (ref 0.3–1.2)
Total Protein: 8 g/dL (ref 6.5–8.1)

## 2023-03-30 MED ORDER — HYDROMORPHONE HCL 2 MG/ML IJ SOLN
2.0000 mg | INTRAMUSCULAR | Status: AC
  Administered 2023-03-30: 2 mg via INTRAVENOUS
  Filled 2023-03-30: qty 1

## 2023-03-30 MED ORDER — SODIUM CHLORIDE 0.9 % IV SOLN
12.5000 mg | Freq: Once | INTRAVENOUS | Status: AC
  Administered 2023-03-30: 12.5 mg via INTRAVENOUS
  Filled 2023-03-30: qty 12.5

## 2023-03-30 MED ORDER — KETOROLAC TROMETHAMINE 15 MG/ML IJ SOLN
15.0000 mg | INTRAMUSCULAR | Status: AC
  Administered 2023-03-30: 15 mg via INTRAVENOUS
  Filled 2023-03-30: qty 1

## 2023-03-30 MED ORDER — ONDANSETRON HCL 4 MG/2ML IJ SOLN
4.0000 mg | INTRAMUSCULAR | Status: DC | PRN
  Administered 2023-03-30: 4 mg via INTRAVENOUS
  Filled 2023-03-30: qty 2

## 2023-03-30 MED ORDER — OXYCODONE HCL 10 MG PO TABS
10.0000 mg | ORAL_TABLET | Freq: Four times a day (QID) | ORAL | 0 refills | Status: DC | PRN
  Filled 2023-04-02 (×2): qty 60, 15d supply, fill #0

## 2023-03-30 NOTE — Progress Notes (Signed)
Reviewed PDMP substance reporting system prior to prescribing opiate medications. No inconsistencies noted.  Meds ordered this encounter  Medications   Oxycodone HCl 10 MG TABS    Sig: Take 1 tablet (10 mg) by mouth every 6 hours as needed.    Dispense:  60 tablet    Refill:  0    Order Specific Question:   Supervising Provider    Answer:   Quentin Angst [[id number]]   Nolon Nations  APRN, MSN, FNP-C Patient Care [facility] [address] [place], [place] [address] [phone]

## 2023-03-30 NOTE — ED Notes (Signed)
Pt adamant about leaving because his wife needs to be at work by 5:30. Pt advised that he needs to wait at least 4 hours after his last dose of pain medication was administered before he can leave if he is driving. Pt coherent and understands the risk of driving. Pt states "I honestly do not feel the medicine". Pt states "I am about to leave, I need to get home to my family". Tried to reason with patient in reference to the risk of driving while impaired.  Spoke with off duty [employer] officer and they report that there is not anything that they can do.   Charge RN & MD notified as well.

## 2023-03-30 NOTE — ED Provider Notes (Signed)
[place] EMERGENCY DEPARTMENT AT [facility] Provider Note   CSN: [id number] Arrival date & time: 03/29/23  2345     History  Chief Complaint  Patient presents with   Sickle Cell Pain Crisis    Austin [name] is a 32 y.o. male.  Presents to the emergency department for evaluation of back and bilateral leg pain.  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  He tried his meds at home without relief.  No chest pain, fever, shortness of breath.       Home Medications Prior to Admission medications   Medication Sig Start Date End Date Taking? Authorizing Provider  folic acid (FOLVITE) 1 MG tablet Take 1 tablet (1 mg total) by mouth daily. 08/12/22 08/12/23 Yes Massie Maroon, FNP  loratadine (CLARITIN) 10 MG tablet Take 1 tablet (10 mg total) by mouth daily. Patient taking differently: Take 10 mg by mouth daily as needed for allergies or rhinitis. 11/13/20  Yes King, Shana Chute, NP  naproxen (NAPROSYN) 500 MG tablet TAKE 1 TABLET BY MOUTH 2 TIMES DAILY WITH A MEAL. Patient taking differently: Take 500 mg by mouth 2 (two) times daily as needed for moderate pain. 11/10/22  Yes Ivonne Andrew, NP  ondansetron (ZOFRAN-ODT) 4 MG disintegrating tablet Dissolve 1 tablet (4 mg total) by mouth every 6 (six) hours as needed for nausea or vomiting. 10/15/22  Yes Massie Maroon, FNP  Oxycodone HCl 10 MG TABS Take 1 tablet (10 mg) by mouth every 6 hours as needed. 03/17/23  Yes Quentin Angst, MD  promethazine (PHENERGAN) 25 MG tablet Take 1 tablet (25 mg total) by mouth every 6 (six) hours as needed for nausea or vomiting. 02/02/23  Yes Derwood Kaplan, MD  sildenafil (VIAGRA) 100 MG tablet Take 1 tablet (100 mg) by mouth daily as needed (for E.D.). 03/29/23  Yes Massie Maroon, FNP  Vitamin D, Ergocalciferol, (DRISDOL) 1.25 MG (50000 UNIT) CAPS capsule TAKE 1 CAPSULE (50,000 UNITS TOTAL) BY MOUTH ONCE A WEEK. X 12 WEEKS. Patient taking differently: Take 50,000  Units by mouth every Thursday. 04/13/22  Yes Ivonne Andrew, NP  dicyclomine (BENTYL) 20 MG tablet TAKE 1 TABLET BY MOUTH TWICE A DAY Patient not taking: Reported on 02/28/2023 03/25/22   Quentin Angst, MD  ondansetron (ZOFRAN) 4 MG tablet Take 1 tablet (4 mg total) by mouth every 6 (six) hours. Patient not taking: Reported on 03/30/2023 03/01/23   Carroll Sage, PA-C  triamcinolone (NASACORT) 55 MCG/ACT AERO nasal inhaler Place 2 sprays into the nose daily. 2 sprays each nostril at night Patient not taking: Reported on 10/14/2022 04/27/21   Drema Halon, MD      Allergies    Patient has no known allergies.    Review of Systems   Review of Systems  Physical Exam Updated Vital Signs BP 106/62   Pulse 80   Temp 98.4 F (36.9 C) (Oral)   Resp 18   Ht 5' 7.5" (1.715 m)   Wt 75.8 kg   SpO2 98%   BMI 25.77 kg/m  Physical Exam Vitals and nursing note reviewed.  Constitutional:      General: He is not in acute distress.    Appearance: He is well-developed.  HENT:     Head: Normocephalic and atraumatic.     Mouth/Throat:     Mouth: Mucous membranes are moist.  Eyes:     General: Vision grossly intact. Gaze aligned appropriately.  Extraocular Movements: Extraocular movements intact.     Conjunctiva/sclera: Conjunctivae normal.  Cardiovascular:     Rate and Rhythm: Normal rate and regular rhythm.     Pulses: Normal pulses.     Heart sounds: Normal heart sounds, S1 normal and S2 normal. No murmur heard.    No friction rub. No gallop.  Pulmonary:     Effort: Pulmonary effort is normal. No respiratory distress.     Breath sounds: Normal breath sounds.  Abdominal:     Palpations: Abdomen is soft.     Tenderness: There is no abdominal tenderness. There is no guarding or rebound.     Hernia: No hernia is present.  Musculoskeletal:        General: No swelling.     Cervical back: Full passive range of motion without pain, normal range of motion and neck supple. No  pain with movement, spinous process tenderness or muscular tenderness. Normal range of motion.     Right lower leg: No edema.     Left lower leg: No edema.  Skin:    General: Skin is warm and dry.     Capillary Refill: Capillary refill takes less than 2 seconds.     Findings: No ecchymosis, erythema, lesion or wound.  Neurological:     Mental Status: He is alert and oriented to person, place, and time.     GCS: GCS eye subscore is 4. GCS verbal subscore is 5. GCS motor subscore is 6.     Cranial Nerves: Cranial nerves 2-12 are intact.     Sensory: Sensation is intact.     Motor: Motor function is intact. No weakness or abnormal muscle tone.     Coordination: Coordination is intact.  Psychiatric:        Mood and Affect: Mood normal.        Speech: Speech normal.        Behavior: Behavior normal.     ED Results / Procedures / Treatments   Labs (all labs ordered are listed, but only abnormal results are displayed) Labs Reviewed  CBC WITH DIFFERENTIAL/PLATELET - Abnormal; Notable for the following components:      Result Value   RBC 4.16 (*)    Hemoglobin 11.6 (*)    HCT 32.5 (*)    MCV 78.1 (*)    Platelets 102 (*)    nRBC 0.3 (*)    All other components within normal limits  COMPREHENSIVE METABOLIC PANEL - Abnormal; Notable for the following components:   AST 12 (*)    Total Bilirubin 1.8 (*)    All other components within normal limits  RETICULOCYTES - Abnormal; Notable for the following components:   Retic Ct Pct 4.3 (*)    RBC. 4.10 (*)    Immature Retic Fract 25.9 (*)    All other components within normal limits    EKG None  Radiology No results found.  Procedures Procedures    Medications Ordered in ED Medications  ondansetron (ZOFRAN) injection 4 mg (4 mg Intravenous Given 03/30/23 0118)  ketorolac (TORADOL) 15 MG/ML injection 15 mg (15 mg Intravenous Given 03/30/23 0109)  HYDROmorphone (DILAUDID) injection 2 mg (2 mg Intravenous Given 03/30/23 0110)   HYDROmorphone (DILAUDID) injection 2 mg (2 mg Intravenous Given 03/30/23 0159)  HYDROmorphone (DILAUDID) injection 2 mg (2 mg Intravenous Given 03/30/23 0229)  diphenhydrAMINE (BENADRYL) 12.5 mg in sodium chloride 0.9 % 50 mL IVPB (0 mg Intravenous Stopped 03/30/23 0158)    ED Course/ Medical Decision Making/  A&P                                 Medical Decision Making Amount and/or Complexity of Data Reviewed Labs: ordered.  Risk Prescription drug management.   Differential diagnosis considered includes, but not limited to: Sickle cell crisis; anemia; chronic pain  Patient presents with what appears to be an acute sickle cell crisis.  Patient with lower extremity pain similar to prior crisis.  Uncomfortable at arrival.  Patient feeling much better after multiple doses of analgesia.  No significant anemia.  All labs appropriate.  Patient appears well.  No concern for acute chest.  CRITICAL CARE Performed by: Gilda Crease   Total critical care time: 30 minutes  Critical care time was exclusive of separately billable procedures and treating other patients.  Critical care was necessary to treat or prevent imminent or life-threatening deterioration.  Critical care was time spent personally by me on the following activities: development of treatment plan with patient and/or surrogate as well as nursing, discussions with consultants, evaluation of patient's response to treatment, examination of patient, obtaining history from patient or surrogate, ordering and performing treatments and interventions, ordering and review of laboratory studies, ordering and review of radiographic studies, pulse oximetry and re-evaluation of patient's condition.         Final Clinical Impression(s) / ED Diagnoses Final diagnoses:  Sickle cell crisis [facility])    Rx / DC Orders ED Discharge Orders     None         Keah Lamba, Canary Brim, MD 03/30/23 0320

## 2023-03-31 ENCOUNTER — Other Ambulatory Visit (HOSPITAL_COMMUNITY): Payer: Self-pay

## 2023-04-01 ENCOUNTER — Other Ambulatory Visit (HOSPITAL_COMMUNITY): Payer: Self-pay

## 2023-04-02 ENCOUNTER — Other Ambulatory Visit (HOSPITAL_COMMUNITY): Payer: Self-pay

## 2023-04-11 ENCOUNTER — Encounter (HOSPITAL_COMMUNITY): Payer: Self-pay

## 2023-04-11 ENCOUNTER — Other Ambulatory Visit: Payer: Self-pay

## 2023-04-11 ENCOUNTER — Emergency Department (HOSPITAL_COMMUNITY)
Admission: EM | Admit: 2023-04-11 | Discharge: 2023-04-11 | Disposition: A | Discharge status: Home / Self Care | Payer: 59 | Attending: Emergency Medicine | Admitting: Emergency Medicine

## 2023-04-11 ENCOUNTER — Other Ambulatory Visit (HOSPITAL_COMMUNITY): Payer: Self-pay

## 2023-04-11 ENCOUNTER — Emergency Department (HOSPITAL_COMMUNITY): Payer: 59

## 2023-04-11 DIAGNOSIS — D57219 Sickle-cell/Hb-C disease with crisis, unspecified: Secondary | ICD-10-CM | POA: Insufficient documentation

## 2023-04-11 DIAGNOSIS — R519 Headache, unspecified: Secondary | ICD-10-CM | POA: Insufficient documentation

## 2023-04-11 DIAGNOSIS — Z23 Encounter for immunization: Secondary | ICD-10-CM | POA: Diagnosis not present

## 2023-04-11 DIAGNOSIS — D5700 Hb-SS disease with crisis, unspecified: Secondary | ICD-10-CM

## 2023-04-11 DIAGNOSIS — S0081XA Abrasion of other part of head, initial encounter: Secondary | ICD-10-CM | POA: Insufficient documentation

## 2023-04-11 LAB — CBC WITH DIFFERENTIAL/PLATELET
Abs Immature Granulocytes: 0.05 10*3/uL (ref 0.00–0.07)
Basophils Absolute: 0 10*3/uL (ref 0.0–0.1)
Basophils Relative: 0 %
Eosinophils Absolute: 0 10*3/uL (ref 0.0–0.5)
Eosinophils Relative: 0 %
HCT: 39.9 % (ref 39.0–52.0)
Hemoglobin: 14.2 g/dL (ref 13.0–17.0)
Immature Granulocytes: 0 %
Lymphocytes Relative: 19 %
Lymphs Abs: 2.5 10*3/uL (ref 0.7–4.0)
MCH: 28.2 pg (ref 26.0–34.0)
MCHC: 35.6 g/dL (ref 30.0–36.0)
MCV: 79.2 fL — ABNORMAL LOW (ref 80.0–100.0)
Monocytes Absolute: 0.9 10*3/uL (ref 0.1–1.0)
Monocytes Relative: 7 %
Neutro Abs: 9.6 10*3/uL — ABNORMAL HIGH (ref 1.7–7.7)
Neutrophils Relative %: 74 %
Platelets: 130 10*3/uL — ABNORMAL LOW (ref 150–400)
RBC: 5.04 MIL/uL (ref 4.22–5.81)
RDW: 15 % (ref 11.5–15.5)
WBC: 13.1 10*3/uL — ABNORMAL HIGH (ref 4.0–10.5)
nRBC: 0.2 % (ref 0.0–0.2)

## 2023-04-11 LAB — COMPREHENSIVE METABOLIC PANEL
ALT: 19 U/L (ref 0–44)
AST: 12 U/L — ABNORMAL LOW (ref 15–41)
Albumin: 4.9 g/dL (ref 3.5–5.0)
Alkaline Phosphatase: 55 U/L (ref 38–126)
Anion gap: 11 (ref 5–15)
BUN: 10 mg/dL (ref 6–20)
CO2: 24 mmol/L (ref 22–32)
Calcium: 9.4 mg/dL (ref 8.9–10.3)
Chloride: 100 mmol/L (ref 98–111)
Creatinine, Ser: 0.89 mg/dL (ref 0.61–1.24)
GFR, Estimated: >60 mL/min (ref >60)
Glucose, Bld: 84 mg/dL (ref 70–99)
Potassium: 4 mmol/L (ref 3.5–5.1)
Sodium: 135 mmol/L (ref 135–145)
Total Bilirubin: 2.4 mg/dL — ABNORMAL HIGH (ref 0.3–1.2)
Total Protein: 8.6 g/dL — ABNORMAL HIGH (ref 6.5–8.1)

## 2023-04-11 LAB — RETICULOCYTES
Immature Retic Fract: 19 % — ABNORMAL HIGH (ref 2.3–15.9)
RBC.: 5.02 MIL/uL (ref 4.22–5.81)
Retic Count, Absolute: 183.2 10*3/uL (ref 19.0–186.0)
Retic Ct Pct: 3.7 % — ABNORMAL HIGH (ref 0.4–3.1)

## 2023-04-11 MED ORDER — TETANUS-DIPHTH-ACELL PERTUSSIS 5-2.5-18.5 LF-MCG/0.5 IM SUSY
0.5000 mL | PREFILLED_SYRINGE | Freq: Once | INTRAMUSCULAR | Status: AC
  Administered 2023-04-11: 0.5 mL via INTRAMUSCULAR
  Filled 2023-04-11: qty 0.5

## 2023-04-11 MED ORDER — HYDROMORPHONE HCL 2 MG/ML IJ SOLN
2.0000 mg | INTRAMUSCULAR | Status: AC
  Administered 2023-04-11: 2 mg via INTRAVENOUS
  Filled 2023-04-11: qty 1

## 2023-04-11 MED ORDER — ONDANSETRON HCL 4 MG/2ML IJ SOLN
4.0000 mg | Freq: Once | INTRAMUSCULAR | Status: AC
  Administered 2023-04-11: 4 mg via INTRAVENOUS
  Filled 2023-04-11: qty 2

## 2023-04-11 MED ORDER — BACITRACIN ZINC 500 UNIT/GM EX OINT
TOPICAL_OINTMENT | Freq: Two times a day (BID) | CUTANEOUS | Status: DC
  Administered 2023-04-11: 1 via TOPICAL
  Filled 2023-04-11: qty 0.9

## 2023-04-11 MED ORDER — BACITRACIN ZINC 500 UNIT/GM EX OINT: 1.0000 | TOPICAL_OINTMENT | Freq: Two times a day (BID) | CUTANEOUS | 0 refills | Status: DC
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
## 2023-04-11 NOTE — Discharge Instructions (Signed)
It was a pleasure taking care of you here in the emergency department  I would recommend following up with the sickle cell clinic as needed  Let warm soapy water run over your wounds.  You may place bacitracin to this area.  I have written a prescription for this  Keep close follow-up outpatient, return for any worsening symptoms

## 2023-04-11 NOTE — ED Notes (Signed)
Patient and stretcher removed from the room at this time. Will try to give meds and draw blood when he gets back.
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
## 2023-04-14 ENCOUNTER — Other Ambulatory Visit (HOSPITAL_COMMUNITY): Payer: Self-pay

## 2023-04-14 ENCOUNTER — Other Ambulatory Visit: Payer: Self-pay | Admitting: Family Medicine

## 2023-04-14 ENCOUNTER — Other Ambulatory Visit (HOSPITAL_COMMUNITY): Payer: Self-pay | Admitting: Student

## 2023-04-14 DIAGNOSIS — D571 Sickle-cell disease without crisis: Secondary | ICD-10-CM

## 2023-04-14 MED ORDER — ONDANSETRON 4 MG PO TBDP
4.0000 mg | ORAL_TABLET | Freq: Four times a day (QID) | ORAL | 3 refills | Status: DC | PRN
  Filled 2023-04-14: qty 30, 8d supply, fill #0
  Filled 2023-05-16: qty 30, 18d supply, fill #1
  Filled 2023-06-21: qty 18, 21d supply, fill #2
  Filled 2023-07-25: qty 18, 21d supply, fill #3
  Filled 2023-08-18: qty 18, 21d supply, fill #4

## 2023-04-14 MED ORDER — OXYCODONE HCL 10 MG PO TABS
10.0000 mg | ORAL_TABLET | Freq: Four times a day (QID) | ORAL | 0 refills | Status: DC | PRN
Start: 2023-04-16 — End: 2023-05-03
  Filled 2023-04-16: qty 60, 15d supply, fill #0

## 2023-04-14 NOTE — Progress Notes (Signed)
Reviewed PDMP substance reporting system prior to prescribing opiate medications. No inconsistencies noted.  Meds ordered this encounter  Medications   Oxycodone HCl 10 MG TABS    Sig: Take 1 tablet (10 mg) by mouth every 6 hours as needed.    Dispense:  60 tablet    Refill:  0    Order Specific Question:   Supervising Provider    Answer:   Quentin Angst [[id number]]   ondansetron (ZOFRAN-ODT) 4 MG disintegrating tablet    Sig: Dissolve 1 tablet (4 mg total) by mouth every 6 (six) hours as needed for nausea or vomiting.    Dispense:  30 tablet    Refill:  3    Order Specific Question:   Supervising Provider    Answer:   Quentin Angst [[id number]]   Nolon Nations  APRN, MSN, FNP-C [facility] [address] [place], [place] [address] [phone]

## 2023-04-14 NOTE — Telephone Encounter (Signed)
Please advise Kh

## 2023-04-15 ENCOUNTER — Emergency Department (HOSPITAL_COMMUNITY)
Admission: EM | Admit: 2023-04-15 | Discharge: 2023-04-15 | Disposition: A | Discharge status: Home / Self Care | Payer: 59 | Attending: Emergency Medicine | Admitting: Emergency Medicine

## 2023-04-15 ENCOUNTER — Encounter (HOSPITAL_COMMUNITY): Payer: Self-pay

## 2023-04-15 ENCOUNTER — Other Ambulatory Visit: Payer: Self-pay

## 2023-04-15 DIAGNOSIS — D57219 Sickle-cell/Hb-C disease with crisis, unspecified: Secondary | ICD-10-CM | POA: Diagnosis not present

## 2023-04-15 DIAGNOSIS — D5700 Hb-SS disease with crisis, unspecified: Secondary | ICD-10-CM

## 2023-04-15 DIAGNOSIS — M791 Myalgia, unspecified site: Secondary | ICD-10-CM | POA: Diagnosis present

## 2023-04-15 LAB — BASIC METABOLIC PANEL
Anion gap: 7 (ref 5–15)
BUN: 9 mg/dL (ref 6–20)
CO2: 29 mmol/L (ref 22–32)
Calcium: 9.8 mg/dL (ref 8.9–10.3)
Chloride: 103 mmol/L (ref 98–111)
Creatinine, Ser: 0.82 mg/dL (ref 0.61–1.24)
GFR, Estimated: >60 mL/min (ref >60)
Glucose, Bld: 124 mg/dL — ABNORMAL HIGH (ref 70–99)
Potassium: 4.5 mmol/L (ref 3.5–5.1)
Sodium: 139 mmol/L (ref 135–145)

## 2023-04-15 LAB — CBC WITH DIFFERENTIAL/PLATELET
Abs Immature Granulocytes: 0.06 10*3/uL (ref 0.00–0.07)
Basophils Absolute: 0 10*3/uL (ref 0.0–0.1)
Basophils Relative: 0 %
Eosinophils Absolute: 0 10*3/uL (ref 0.0–0.5)
Eosinophils Relative: 0 %
HCT: 41 % (ref 39.0–52.0)
Hemoglobin: 14.6 g/dL (ref 13.0–17.0)
Immature Granulocytes: 0 %
Lymphocytes Relative: 9 %
Lymphs Abs: 1.3 10*3/uL (ref 0.7–4.0)
MCH: 28 pg (ref 26.0–34.0)
MCHC: 35.6 g/dL (ref 30.0–36.0)
MCV: 78.7 fL — ABNORMAL LOW (ref 80.0–100.0)
Monocytes Absolute: 0.6 10*3/uL (ref 0.1–1.0)
Monocytes Relative: 4 %
Neutro Abs: 13 10*3/uL — ABNORMAL HIGH (ref 1.7–7.7)
Neutrophils Relative %: 87 %
Platelets: 125 10*3/uL — ABNORMAL LOW (ref 150–400)
RBC: 5.21 MIL/uL (ref 4.22–5.81)
RDW: 14.8 % (ref 11.5–15.5)
WBC: 15.1 10*3/uL — ABNORMAL HIGH (ref 4.0–10.5)
nRBC: 0.3 % — ABNORMAL HIGH (ref 0.0–0.2)

## 2023-04-15 LAB — RETICULOCYTES
Immature Retic Fract: 32.3 % — ABNORMAL HIGH (ref 2.3–15.9)
RBC.: 5.21 MIL/uL (ref 4.22–5.81)
Retic Count, Absolute: 208.9 10*3/uL — ABNORMAL HIGH (ref 19.0–186.0)
Retic Ct Pct: 4 % — ABNORMAL HIGH (ref 0.4–3.1)

## 2023-04-15 MED ORDER — HYDROMORPHONE HCL 2 MG/ML IJ SOLN
2.0000 mg | INTRAMUSCULAR | Status: AC
  Administered 2023-04-15: 2 mg via INTRAVENOUS
  Filled 2023-04-15: qty 1

## 2023-04-15 MED ORDER — ONDANSETRON HCL 4 MG/2ML IJ SOLN
4.0000 mg | INTRAMUSCULAR | Status: DC | PRN
  Administered 2023-04-15: 4 mg via INTRAVENOUS
  Filled 2023-04-15: qty 2

## 2023-04-15 MED ORDER — PROMETHAZINE HCL 25 MG PO TABS: 25.0000 mg | ORAL_TABLET | Freq: Four times a day (QID) | ORAL | 0 refills | Status: DC | PRN

## 2023-04-15 MED ORDER — SODIUM CHLORIDE 0.9 % IV SOLN
12.5000 mg | Freq: Four times a day (QID) | INTRAVENOUS | Status: DC | PRN
  Administered 2023-04-15: 12.5 mg via INTRAVENOUS
  Filled 2023-04-15: qty 0.5

## 2023-04-15 MED ORDER — DEXTROSE-SODIUM CHLORIDE 5-0.45 % IV SOLN: INTRAVENOUS | Status: DC

## 2023-04-15 MED ORDER — DIPHENHYDRAMINE HCL 25 MG PO CAPS
25.0000 mg | ORAL_CAPSULE | ORAL | Status: DC | PRN
  Administered 2023-04-15: 25 mg via ORAL
  Filled 2023-04-15: qty 1

## 2023-04-15 NOTE — ED Triage Notes (Signed)
The pt was [facility]. The pt called and stated he is having generalized pain with vomiting. The pt has a PMHx of Sickle Cell Disease. He took Zofran which has not helped. The pt was just here in the ED 2 days ago for the same issue. VS. B/P 154/90,  P 66, O2 Sats 99%.

## 2023-04-15 NOTE — ED Notes (Signed)
Pt discharged home. Discharge information discussed. No s/s of distress observed during discharge.
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
## 2023-04-15 NOTE — Discharge Instructions (Signed)
Take the Phenergan as needed for any nausea or vomiting.  Return for any new or worse symptoms.  Make an appointment to follow-up with sickle cell clinic.  Today's labs without any significant abnormalities.  It is consistent with sickle cell crisis.

## 2023-04-16 ENCOUNTER — Other Ambulatory Visit (HOSPITAL_COMMUNITY): Payer: Self-pay

## 2023-04-18 ENCOUNTER — Emergency Department (HOSPITAL_COMMUNITY)
Admission: EM | Admit: 2023-04-18 | Discharge: 2023-04-18 | Disposition: A | Discharge status: Home / Self Care | Payer: 59 | Attending: Emergency Medicine | Admitting: Emergency Medicine

## 2023-04-18 ENCOUNTER — Encounter (HOSPITAL_COMMUNITY): Payer: Self-pay

## 2023-04-18 ENCOUNTER — Telehealth (HOSPITAL_COMMUNITY): Payer: Self-pay | Admitting: *Deleted

## 2023-04-18 DIAGNOSIS — R197 Diarrhea, unspecified: Secondary | ICD-10-CM | POA: Diagnosis not present

## 2023-04-18 DIAGNOSIS — R112 Nausea with vomiting, unspecified: Secondary | ICD-10-CM | POA: Diagnosis not present

## 2023-04-18 DIAGNOSIS — D5700 Hb-SS disease with crisis, unspecified: Secondary | ICD-10-CM | POA: Insufficient documentation

## 2023-04-18 LAB — RETICULOCYTES
Immature Retic Fract: 31.1 % — ABNORMAL HIGH (ref 2.3–15.9)
RBC.: 4.81 MIL/uL (ref 4.22–5.81)
Retic Count, Absolute: 195.8 10*3/uL — ABNORMAL HIGH (ref 19.0–186.0)
Retic Ct Pct: 4.1 % — ABNORMAL HIGH (ref 0.4–3.1)

## 2023-04-18 LAB — COMPREHENSIVE METABOLIC PANEL
ALT: 18 U/L (ref 0–44)
AST: 10 U/L — ABNORMAL LOW (ref 15–41)
Albumin: 4.7 g/dL (ref 3.5–5.0)
Alkaline Phosphatase: 50 U/L (ref 38–126)
Anion gap: 9 (ref 5–15)
BUN: 10 mg/dL (ref 6–20)
CO2: 27 mmol/L (ref 22–32)
Calcium: 9.2 mg/dL (ref 8.9–10.3)
Chloride: 101 mmol/L (ref 98–111)
Creatinine, Ser: 0.88 mg/dL (ref 0.61–1.24)
GFR, Estimated: >60 mL/min (ref >60)
Glucose, Bld: 121 mg/dL — ABNORMAL HIGH (ref 70–99)
Potassium: 3.4 mmol/L — ABNORMAL LOW (ref 3.5–5.1)
Sodium: 137 mmol/L (ref 135–145)
Total Bilirubin: 1.6 mg/dL — ABNORMAL HIGH (ref 0.3–1.2)
Total Protein: 8.1 g/dL (ref 6.5–8.1)

## 2023-04-18 LAB — CBC WITH DIFFERENTIAL/PLATELET
Abs Immature Granulocytes: 0.07 10*3/uL (ref 0.00–0.07)
Basophils Absolute: 0.1 10*3/uL (ref 0.0–0.1)
Basophils Relative: 0 %
Eosinophils Absolute: 0.1 10*3/uL (ref 0.0–0.5)
Eosinophils Relative: 1 %
HCT: 37.6 % — ABNORMAL LOW (ref 39.0–52.0)
Hemoglobin: 13.4 g/dL (ref 13.0–17.0)
Immature Granulocytes: 1 %
Lymphocytes Relative: 12 %
Lymphs Abs: 1.7 10*3/uL (ref 0.7–4.0)
MCH: 28.2 pg (ref 26.0–34.0)
MCHC: 35.6 g/dL (ref 30.0–36.0)
MCV: 79.2 fL — ABNORMAL LOW (ref 80.0–100.0)
Monocytes Absolute: 0.9 10*3/uL (ref 0.1–1.0)
Monocytes Relative: 7 %
Neutro Abs: 10.8 10*3/uL — ABNORMAL HIGH (ref 1.7–7.7)
Neutrophils Relative %: 79 %
Platelets: 112 10*3/uL — ABNORMAL LOW (ref 150–400)
RBC: 4.75 MIL/uL (ref 4.22–5.81)
RDW: 14.7 % (ref 11.5–15.5)
WBC: 13.6 10*3/uL — ABNORMAL HIGH (ref 4.0–10.5)
nRBC: 0.4 % — ABNORMAL HIGH (ref 0.0–0.2)

## 2023-04-18 LAB — LIPASE, BLOOD: Lipase: 30 U/L (ref 11–51)

## 2023-04-18 MED ORDER — SODIUM CHLORIDE 0.9 % IV BOLUS
1000.0000 mL | Freq: Once | INTRAVENOUS | Status: AC
  Administered 2023-04-18: 1000 mL via INTRAVENOUS

## 2023-04-18 MED ORDER — ONDANSETRON HCL 4 MG/2ML IJ SOLN: 4.0000 mg | INTRAMUSCULAR | Status: DC | PRN

## 2023-04-18 MED ORDER — HYDROMORPHONE HCL 2 MG/ML IJ SOLN
2.0000 mg | Freq: Once | INTRAMUSCULAR | Status: AC
  Administered 2023-04-18: 2 mg via INTRAVENOUS
  Filled 2023-04-18: qty 1

## 2023-04-18 MED ORDER — KETOROLAC TROMETHAMINE 15 MG/ML IJ SOLN
15.0000 mg | Freq: Once | INTRAMUSCULAR | Status: AC
  Administered 2023-04-18: 15 mg via INTRAVENOUS
  Filled 2023-04-18: qty 1

## 2023-04-18 MED ORDER — DIPHENHYDRAMINE HCL 50 MG/ML IJ SOLN
12.5000 mg | Freq: Once | INTRAMUSCULAR | Status: AC
  Administered 2023-04-18: 12.5 mg via INTRAVENOUS
  Filled 2023-04-18: qty 1

## 2023-04-18 NOTE — Telephone Encounter (Signed)
Patient came to the waiting room of the sickle cell day hospital for triage. Patient reports generalized pain rated 10/10. Patient is actively vomiting and sweating. Patient also reports having diarrhea and abdominal pain. [place], FNP notified and advised that patient go to the ED for work-up due to his symptoms. Patient advised and expresses an understanding.

## 2023-04-18 NOTE — ED Provider Notes (Signed)
[place] EMERGENCY DEPARTMENT AT [facility] Provider Note   CSN: [id number] Arrival date & time: 04/18/23  1324     History  Chief Complaint  Patient presents with   Sickle Cell Pain Crisis    Austin [name] is a 32 y.o. male with hx of sickle cell disease, splenomegaly, presenting to ED with nausea, vomiting, diarrhea, and sickle cell pain.  Last treated 3 days ago for sickle cell pain crisis.  Reporting minimal relief at home.  Now having vomiting, diarrhea, abdominal pain.  Went to [facility] clinic this morning and referred to ED for further eval.  Patient reports he "sometimes" has these GI symptoms with his SCD.  No sick contacts at home.  HPI     Home Medications Prior to Admission medications   Medication Sig Start Date End Date Taking? Authorizing Provider  bacitracin ointment Apply 1 Application topically 2 (two) times daily. 04/11/23   Henderly, Britni A, PA-C  dicyclomine (BENTYL) 20 MG tablet TAKE 1 TABLET BY MOUTH TWICE A DAY Patient not taking: Reported on 02/28/2023 03/25/22   Quentin Angst, MD  folic acid (FOLVITE) 1 MG tablet Take 1 tablet (1 mg total) by mouth daily. 08/12/22 08/12/23  Massie Maroon, FNP  loratadine (CLARITIN) 10 MG tablet Take 1 tablet (10 mg total) by mouth daily. Patient taking differently: Take 10 mg by mouth daily as needed for allergies or rhinitis. 11/13/20   Barbette Merino, NP  naproxen (NAPROSYN) 500 MG tablet TAKE 1 TABLET BY MOUTH 2 TIMES DAILY WITH A MEAL. Patient taking differently: Take 500 mg by mouth 2 (two) times daily as needed for moderate pain. 11/10/22   Ivonne Andrew, NP  ondansetron (ZOFRAN) 4 MG tablet Take 1 tablet (4 mg total) by mouth every 6 (six) hours. Patient not taking: Reported on 03/30/2023 03/01/23   Carroll Sage, PA-C  ondansetron (ZOFRAN-ODT) 4 MG disintegrating tablet Dissolve 1 tablet (4 mg total) by mouth every 6 (six) hours as needed for nausea or vomiting. 04/14/23   Massie Maroon,  FNP  Oxycodone HCl 10 MG TABS Take 1 tablet (10 mg) by mouth every 6 hours as needed. 04/16/23   Massie Maroon, FNP  promethazine (PHENERGAN) 25 MG tablet Take 1 tablet (25 mg total) by mouth every 6 (six) hours as needed for nausea or vomiting. 02/02/23   Derwood Kaplan, MD  promethazine (PHENERGAN) 25 MG tablet Take 1 tablet (25 mg total) by mouth every 6 (six) hours as needed for nausea or vomiting. 04/15/23   Vanetta Mulders, MD  sildenafil (VIAGRA) 100 MG tablet Take 1 tablet (100 mg) by mouth daily as needed (for E.D.). 03/29/23   Massie Maroon, FNP  triamcinolone (NASACORT) 55 MCG/ACT AERO nasal inhaler Place 2 sprays into the nose daily. 2 sprays each nostril at night Patient not taking: Reported on 10/14/2022 04/27/21   Drema Halon, MD  Vitamin D, Ergocalciferol, (DRISDOL) 1.25 MG (50000 UNIT) CAPS capsule TAKE 1 CAPSULE (50,000 UNITS TOTAL) BY MOUTH ONCE A WEEK. X 12 WEEKS. Patient taking differently: Take 50,000 Units by mouth every Thursday. 04/13/22   Ivonne Andrew, NP      Allergies    Patient has no known allergies.    Review of Systems   Review of Systems  Physical Exam Updated Vital Signs BP 126/87   Pulse 92   Temp 98.2 F (36.8 C) (Oral)   Resp 18   Ht 5\' 7"  (1.702 m)  Wt 72.6 kg   SpO2 94%   BMI 25.06 kg/m  Physical Exam Constitutional:      General: He is not in acute distress. HENT:     Head: Normocephalic and atraumatic.  Eyes:     Conjunctiva/sclera: Conjunctivae normal.     Pupils: Pupils are equal, round, and reactive to light.  Cardiovascular:     Rate and Rhythm: Normal rate and regular rhythm.  Pulmonary:     Effort: Pulmonary effort is normal. No respiratory distress.  Abdominal:     Tenderness: There is abdominal tenderness.  Skin:    General: Skin is warm and dry.  Neurological:     General: No focal deficit present.     Mental Status: He is alert. Mental status is at baseline.  Psychiatric:        Mood and Affect: Mood  normal.        Behavior: Behavior normal.     ED Results / Procedures / Treatments   Labs (all labs ordered are listed, but only abnormal results are displayed) Labs Reviewed  RETICULOCYTES - Abnormal; Notable for the following components:      Result Value   Retic Ct Pct 4.1 (*)    Retic Count, Absolute 195.8 (*)    Immature Retic Fract 31.1 (*)    All other components within normal limits  CBC WITH DIFFERENTIAL/PLATELET - Abnormal; Notable for the following components:   WBC 13.6 (*)    HCT 37.6 (*)    MCV 79.2 (*)    Platelets 112 (*)    nRBC 0.4 (*)    Neutro Abs 10.8 (*)    All other components within normal limits  COMPREHENSIVE METABOLIC PANEL - Abnormal; Notable for the following components:   Potassium 3.4 (*)    Glucose, Bld 121 (*)    AST 10 (*)    Total Bilirubin 1.6 (*)    All other components within normal limits  LIPASE, BLOOD    EKG EKG Interpretation Date/Time:  Monday April 18 2023 09:34:08 EDT Ventricular Rate:  77 PR Interval:  133 QRS Duration:  101 QT Interval:  370 QTC Calculation: 419 R Axis:   73  Text Interpretation: Sinus rhythm RSR' in V1 or V2, probably normal variant Probable left ventricular hypertrophy Confirmed by Alvester Chou [phone]) on 04/18/2023 9:40:03 AM  Radiology No results found.  Procedures .Critical Care  Performed by: Terald Sleeper, MD Authorized by: Terald Sleeper, MD   Critical care provider statement:    Critical care time (minutes):  30   Critical care time was exclusive of:  Separately billable procedures and treating other patients   Critical care was necessary to treat or prevent imminent or life-threatening deterioration of the following conditions:  Circulatory failure   Critical care was time spent personally by me on the following activities:  Ordering and performing treatments and interventions, ordering and review of laboratory studies, ordering and review of radiographic studies, pulse  oximetry, review of old charts, examination of patient and evaluation of patient's response to treatment Comments:     Sickle cell pain crisis management     Medications Ordered in ED Medications  ondansetron (ZOFRAN) injection 4 mg (has no administration in time range)  HYDROmorphone (DILAUDID) injection 2 mg (2 mg Intravenous Given 04/18/23 0927)  diphenhydrAMINE (BENADRYL) injection 12.5 mg (12.5 mg Intravenous Given 04/18/23 0927)  ketorolac (TORADOL) 15 MG/ML injection 15 mg (15 mg Intravenous Given 04/18/23 0928)  sodium chloride 0.9 % bolus  1,000 mL (0 mLs Intravenous Stopped 04/18/23 1058)  HYDROmorphone (DILAUDID) injection 2 mg (2 mg Intravenous Given 04/18/23 1042)  HYDROmorphone (DILAUDID) injection 2 mg (2 mg Intravenous Given 04/18/23 1250)    ED Course/ Medical Decision Making/ A&P Clinical Course as of 04/18/23 1421  Mon Apr 18, 2023  1114 Labs stable, including hgb, reticulocyte count.  Lipase and lft unremarkable [MT]  1114 Tbili chronically very mildly elevated, likewise WBC mildly chronically elevated.  No clear indication of developing biliary or pancreatic abdominal process at this time. [MT]  1241 Patient had reported improving pain, still having some discomfort requesting a third dose of pain medicine, but tolerating p.o. fluids per nursing report [MT]  1419 Patient feeling significantly better and requesting to be discharged.  I think this is reasonable at this point.  Vitals have remained stable [MT]    Clinical Course User Index [MT] Robson Trickey, Kermit Balo, MD                                 Medical Decision Making Amount and/or Complexity of Data Reviewed Labs: ordered. ECG/medicine tests: ordered.  Risk Prescription drug management.   This patient presents to the ED with concern for pain, n/v/d. This involves an extensive number of treatment options, and is a complaint that carries with it a high risk of complications and morbidity.  The differential diagnosis  includes sickle cell pain crisis vs intraabdominal process including viral illness vs gastritis vs CHS vs gastroparesis vs pancreatitis vs other  Known hx of splenomegaly  External records from outside source obtained and reviewed including last hospitalized March 2024 for pain crisis, noted to have nausea vomiting at that time as well.  I ordered and personally interpreted labs.  The pertinent results include: No emergent findings.  Labs are at baseline levels   The patient was maintained on a cardiac monitor.  I personally viewed and interpreted the cardiac monitored which showed an underlying rhythm of: Sinus rhythm  Per my interpretation the patient's ECG shows no acute ischemic findings  I ordered medication including IV pain and nausea medications  I have reviewed the patients home medicines and have made adjustments as needed  Test Considered: Low clinical suspicion for acute intra-abdominal process to require emergent repeat CT imaging of the abdomen.  I suspect this is chronic nausea and vomiting in the setting of sickle cell pain crisis   After the interventions noted above, I reevaluated the patient and found that they have: improved   Dispostion:  After consideration of the diagnostic results and the patients response to treatment, I feel that the patent would benefit from outpatient follow-up.  Advised the patient follow-up with his sickle cell clinic.  He verbalized understanding.  He reports he is a safe ride to take him home.         Final Clinical Impression(s) / ED Diagnoses Final diagnoses:  Sickle cell pain crisis [facility])    Rx / DC Orders ED Discharge Orders     None         Terald Sleeper, MD 04/18/23 1421

## 2023-04-18 NOTE — Discharge Instructions (Addendum)
You were given opioid narcotics in the ER today, and you should not drive a car or perform dangerous activities for the rest of the day.  These follow-up with your doctor's clinic or the sickle cell clinic.

## 2023-04-18 NOTE — ED Triage Notes (Signed)
Pt reports stomach, back, and leg sickle cell pain starting this morning. Actively vomiting in triage, unable to keep pain meds down

## 2023-04-30 ENCOUNTER — Other Ambulatory Visit (HOSPITAL_COMMUNITY): Payer: Self-pay

## 2023-04-30 ENCOUNTER — Other Ambulatory Visit: Payer: Self-pay | Admitting: Family Medicine

## 2023-04-30 DIAGNOSIS — D571 Sickle-cell disease without crisis: Secondary | ICD-10-CM

## 2023-05-02 ENCOUNTER — Other Ambulatory Visit (HOSPITAL_COMMUNITY): Payer: Self-pay

## 2023-05-02 ENCOUNTER — Other Ambulatory Visit: Payer: Self-pay

## 2023-05-03 ENCOUNTER — Other Ambulatory Visit (HOSPITAL_COMMUNITY): Payer: Self-pay

## 2023-05-03 ENCOUNTER — Other Ambulatory Visit: Payer: Self-pay | Admitting: Family Medicine

## 2023-05-03 DIAGNOSIS — D571 Sickle-cell disease without crisis: Secondary | ICD-10-CM

## 2023-05-03 MED ORDER — OXYCODONE HCL 10 MG PO TABS
10.0000 mg | ORAL_TABLET | Freq: Four times a day (QID) | ORAL | 0 refills | Status: DC | PRN
Start: 2023-05-03 — End: 2023-05-26
  Filled 2023-05-03: qty 60, 15d supply, fill #0

## 2023-05-03 NOTE — Progress Notes (Signed)
Reviewed PDMP substance reporting system prior to prescribing opiate medications. No inconsistencies noted.  Meds ordered this encounter  Medications   Oxycodone HCl 10 MG TABS    Sig: Take 1 tablet (10 mg) by mouth every 6 hours as needed.    Dispense:  60 tablet    Refill:  0    Order Specific Question:   Supervising Provider    Answer:   Quentin Angst [[id number]]   Nolon Nations  APRN, MSN, FNP-C Patient Care [facility] [address] [place], [place] [address] [phone]

## 2023-05-04 ENCOUNTER — Other Ambulatory Visit (HOSPITAL_COMMUNITY): Payer: Self-pay

## 2023-05-16 ENCOUNTER — Other Ambulatory Visit: Payer: Self-pay | Admitting: Family Medicine

## 2023-05-16 DIAGNOSIS — D571 Sickle-cell disease without crisis: Secondary | ICD-10-CM

## 2023-05-16 NOTE — Telephone Encounter (Signed)
Please advise KH 

## 2023-05-20 ENCOUNTER — Other Ambulatory Visit (HOSPITAL_COMMUNITY): Payer: Self-pay

## 2023-05-20 ENCOUNTER — Emergency Department (HOSPITAL_COMMUNITY)
Admission: EM | Admit: 2023-05-20 | Discharge: 2023-05-20 | Disposition: A | Discharge status: Home / Self Care | Payer: 59 | Attending: Emergency Medicine | Admitting: Emergency Medicine

## 2023-05-20 ENCOUNTER — Telehealth: Payer: Self-pay | Admitting: Family Medicine

## 2023-05-20 ENCOUNTER — Other Ambulatory Visit: Payer: Self-pay

## 2023-05-20 ENCOUNTER — Encounter (HOSPITAL_COMMUNITY): Payer: Self-pay

## 2023-05-20 DIAGNOSIS — D5700 Hb-SS disease with crisis, unspecified: Secondary | ICD-10-CM | POA: Diagnosis present

## 2023-05-20 LAB — COMPREHENSIVE METABOLIC PANEL
ALT: 17 U/L (ref 0–44)
AST: 9 U/L — ABNORMAL LOW (ref 15–41)
Albumin: 4.4 g/dL (ref 3.5–5.0)
Alkaline Phosphatase: 52 U/L (ref 38–126)
Anion gap: 10 (ref 5–15)
BUN: 9 mg/dL (ref 6–20)
CO2: 24 mmol/L (ref 22–32)
Calcium: 9.6 mg/dL (ref 8.9–10.3)
Chloride: 101 mmol/L (ref 98–111)
Creatinine, Ser: 0.9 mg/dL (ref 0.61–1.24)
GFR, Estimated: >60 mL/min (ref >60)
Glucose, Bld: 84 mg/dL (ref 70–99)
Potassium: 4 mmol/L (ref 3.5–5.1)
Sodium: 135 mmol/L (ref 135–145)
Total Bilirubin: 1.6 mg/dL — ABNORMAL HIGH (ref 0.3–1.2)
Total Protein: 7.9 g/dL (ref 6.5–8.1)

## 2023-05-20 LAB — RETICULOCYTES
Immature Retic Fract: 24 % — ABNORMAL HIGH (ref 2.3–15.9)
RBC.: 4.7 MIL/uL (ref 4.22–5.81)
Retic Count, Absolute: 214.3 10*3/uL — ABNORMAL HIGH (ref 19.0–186.0)
Retic Ct Pct: 4.6 % — ABNORMAL HIGH (ref 0.4–3.1)

## 2023-05-20 LAB — CBC WITH DIFFERENTIAL/PLATELET
Abs Immature Granulocytes: 0.1 10*3/uL — ABNORMAL HIGH (ref 0.00–0.07)
Basophils Absolute: 0 10*3/uL (ref 0.0–0.1)
Basophils Relative: 0 %
Eosinophils Absolute: 0.1 10*3/uL (ref 0.0–0.5)
Eosinophils Relative: 1 %
HCT: 37.3 % — ABNORMAL LOW (ref 39.0–52.0)
Hemoglobin: 13.5 g/dL (ref 13.0–17.0)
Immature Granulocytes: 1 %
Lymphocytes Relative: 28 %
Lymphs Abs: 2.4 10*3/uL (ref 0.7–4.0)
MCH: 28.5 pg (ref 26.0–34.0)
MCHC: 36.2 g/dL — ABNORMAL HIGH (ref 30.0–36.0)
MCV: 78.9 fL — ABNORMAL LOW (ref 80.0–100.0)
Monocytes Absolute: 0.8 10*3/uL (ref 0.1–1.0)
Monocytes Relative: 9 %
Neutro Abs: 5.2 10*3/uL (ref 1.7–7.7)
Neutrophils Relative %: 61 %
Platelets: 133 10*3/uL — ABNORMAL LOW (ref 150–400)
RBC: 4.73 MIL/uL (ref 4.22–5.81)
RDW: 14.5 % (ref 11.5–15.5)
WBC: 8.6 10*3/uL (ref 4.0–10.5)
nRBC: 0.6 % — ABNORMAL HIGH (ref 0.0–0.2)

## 2023-05-20 MED ORDER — KETOROLAC TROMETHAMINE 15 MG/ML IJ SOLN
15.0000 mg | INTRAMUSCULAR | Status: AC
  Administered 2023-05-20: 15 mg via INTRAVENOUS
  Filled 2023-05-20: qty 1

## 2023-05-20 MED ORDER — HYDROMORPHONE HCL 2 MG/ML IJ SOLN
2.0000 mg | INTRAMUSCULAR | Status: AC
  Administered 2023-05-20: 2 mg via INTRAVENOUS
  Filled 2023-05-20: qty 1

## 2023-05-20 MED ORDER — ONDANSETRON HCL 4 MG/2ML IJ SOLN
4.0000 mg | INTRAMUSCULAR | Status: DC | PRN
  Administered 2023-05-20: 4 mg via INTRAVENOUS
  Filled 2023-05-20: qty 2

## 2023-05-20 MED ORDER — DIPHENHYDRAMINE HCL 25 MG PO CAPS
25.0000 mg | ORAL_CAPSULE | ORAL | Status: DC | PRN
  Administered 2023-05-20: 50 mg via ORAL
  Filled 2023-05-20: qty 2

## 2023-05-20 NOTE — ED Triage Notes (Signed)
Patient reports sickle cell pain in his lower back since yesterday. Ran out of his home pain meds. Attempted to get a refill, but never heard back from the doctor.

## 2023-05-20 NOTE — Discharge Instructions (Signed)
Follow-up with your sickle cell doctors as planned.  Return as needed for worsening symptoms

## 2023-05-20 NOTE — ED Notes (Signed)
Pt given graham crackers, cup of ice, and 2 cola sodas.

## 2023-05-20 NOTE — ED Notes (Signed)
Pt reports pain in his lower back related to sickle cell

## 2023-05-20 NOTE — ED Provider Notes (Signed)
[place] EMERGENCY DEPARTMENT AT [facility] Provider Note   CSN: [id number] Arrival date & time: 05/20/23  1108     History  Chief Complaint  Patient presents with   Sickle Cell Pain Crisis    Austin [name] is a 32 y.o. male.   Sickle Cell Pain Crisis    Patient has a history of sickle cell pain crisis, chronic pain syndrome, thrombocytopenia.  Patient presents to the ED with complaints of sickle cell pain.  Patient states he has been having pain in his back and knees since yesterday.  Patient ran out of his home pain medications.  He attempted to call the sickle cell clinic but was told the provider was out this week.  Patient denies any fevers or chills.  No vomiting or diarrhea.  Home Medications Prior to Admission medications   Medication Sig Start Date End Date Taking? Authorizing Provider  bacitracin ointment Apply 1 Application topically 2 (two) times daily. 04/11/23   Henderly, Britni A, PA-C  dicyclomine (BENTYL) 20 MG tablet TAKE 1 TABLET BY MOUTH TWICE A DAY Patient not taking: Reported on 02/28/2023 03/25/22   Quentin Angst, MD  folic acid (FOLVITE) 1 MG tablet Take 1 tablet (1 mg total) by mouth daily. 08/12/22 08/12/23  Massie Maroon, FNP  loratadine (CLARITIN) 10 MG tablet Take 1 tablet (10 mg total) by mouth daily. Patient taking differently: Take 10 mg by mouth daily as needed for allergies or rhinitis. 11/13/20   Barbette Merino, NP  naproxen (NAPROSYN) 500 MG tablet TAKE 1 TABLET BY MOUTH 2 TIMES DAILY WITH A MEAL. Patient taking differently: Take 500 mg by mouth 2 (two) times daily as needed for moderate pain. 11/10/22   Ivonne Andrew, NP  ondansetron (ZOFRAN) 4 MG tablet Take 1 tablet (4 mg total) by mouth every 6 (six) hours. Patient not taking: Reported on 03/30/2023 03/01/23   Carroll Sage, PA-C  ondansetron (ZOFRAN-ODT) 4 MG disintegrating tablet Dissolve 1 tablet (4 mg total) by mouth every 6 (six) hours as needed for nausea  or vomiting. 04/14/23   Massie Maroon, FNP  Oxycodone HCl 10 MG TABS Take 1 tablet (10 mg) by mouth every 6 hours as needed. 05/03/23   Massie Maroon, FNP  promethazine (PHENERGAN) 25 MG tablet Take 1 tablet (25 mg total) by mouth every 6 (six) hours as needed for nausea or vomiting. 02/02/23   Derwood Kaplan, MD  promethazine (PHENERGAN) 25 MG tablet Take 1 tablet (25 mg total) by mouth every 6 (six) hours as needed for nausea or vomiting. 04/15/23   Vanetta Mulders, MD  sildenafil (VIAGRA) 100 MG tablet Take 1 tablet (100 mg) by mouth daily as needed (for E.D.). 03/29/23   Massie Maroon, FNP  triamcinolone (NASACORT) 55 MCG/ACT AERO nasal inhaler Place 2 sprays into the nose daily. 2 sprays each nostril at night Patient not taking: Reported on 10/14/2022 04/27/21   Drema Halon, MD  Vitamin D, Ergocalciferol, (DRISDOL) 1.25 MG (50000 UNIT) CAPS capsule TAKE 1 CAPSULE (50,000 UNITS TOTAL) BY MOUTH ONCE A WEEK. X 12 WEEKS. Patient taking differently: Take 50,000 Units by mouth every Thursday. 04/13/22   Ivonne Andrew, NP      Allergies    Patient has no known allergies.    Review of Systems   Review of Systems  Physical Exam Updated Vital Signs BP 112/74   Pulse 74   Temp 98.6 F (37 C)   Resp 18  Ht 1.702 m (5\' 7" )   Wt 74.8 kg   SpO2 96%   BMI 25.84 kg/m  Physical Exam Vitals and nursing note reviewed.  Constitutional:      Appearance: He is well-developed. He is not diaphoretic.  HENT:     Head: Normocephalic and atraumatic.     Right Ear: External ear normal.     Left Ear: External ear normal.  Eyes:     General: No scleral icterus.       Right eye: No discharge.        Left eye: No discharge.     Conjunctiva/sclera: Conjunctivae normal.  Neck:     Trachea: No tracheal deviation.  Cardiovascular:     Rate and Rhythm: Normal rate and regular rhythm.  Pulmonary:     Effort: Pulmonary effort is normal. No respiratory distress.     Breath sounds:  Normal breath sounds. No stridor. No wheezing or rales.  Abdominal:     General: Bowel sounds are normal. There is no distension.     Palpations: Abdomen is soft.     Tenderness: There is no abdominal tenderness. There is no guarding or rebound.  Musculoskeletal:        General: No tenderness or deformity.     Cervical back: Neck supple.  Skin:    General: Skin is warm and dry.     Findings: No rash.  Neurological:     General: No focal deficit present.     Mental Status: He is alert.     Cranial Nerves: No cranial nerve deficit, dysarthria or facial asymmetry.     Sensory: No sensory deficit.     Motor: No abnormal muscle tone or seizure activity.     Coordination: Coordination normal.  Psychiatric:        Mood and Affect: Mood normal.     ED Results / Procedures / Treatments   Labs (all labs ordered are listed, but only abnormal results are displayed) Labs Reviewed  CBC WITH DIFFERENTIAL/PLATELET - Abnormal; Notable for the following components:      Result Value   HCT 37.3 (*)    MCV 78.9 (*)    MCHC 36.2 (*)    Platelets 133 (*)    nRBC 0.6 (*)    Abs Immature Granulocytes 0.10 (*)    All other components within normal limits  COMPREHENSIVE METABOLIC PANEL - Abnormal; Notable for the following components:   AST 9 (*)    Total Bilirubin 1.6 (*)    All other components within normal limits  RETICULOCYTES - Abnormal; Notable for the following components:   Retic Ct Pct 4.6 (*)    Retic Count, Absolute 214.3 (*)    Immature Retic Fract 24.0 (*)    All other components within normal limits    EKG None  Radiology No results found.  Procedures Procedures    Medications Ordered in ED Medications  ondansetron (ZOFRAN) injection 4 mg (4 mg Intravenous Given 05/20/23 1206)  diphenhydrAMINE (BENADRYL) capsule 25-50 mg (50 mg Oral Given 05/20/23 1205)  ketorolac (TORADOL) 15 MG/ML injection 15 mg (15 mg Intravenous Given 05/20/23 1205)  HYDROmorphone (DILAUDID)  injection 2 mg (2 mg Intravenous Given 05/20/23 1205)  HYDROmorphone (DILAUDID) injection 2 mg (2 mg Intravenous Given 05/20/23 1300)  HYDROmorphone (DILAUDID) injection 2 mg (2 mg Intravenous Given 05/20/23 1349)    ED Course/ Medical Decision Making/ A&P Clinical Course as of 05/20/23 1435  Fri May 20, 2023  1337 CBC and metabolic  panel unremarkable.  Reticulate cell count slightly elevated [JK]  1348 Patient states he is feeling better although would like his third dose of medications [JK]    Clinical Course User Index [JK] Linwood Dibbles, MD                                 Medical Decision Making Problems Addressed: Sickle cell pain crisis [facility]): acute illness or injury that poses a threat to life or bodily functions  Amount and/or Complexity of Data Reviewed Labs: ordered. Decision-making details documented in ED Course.  Risk Prescription drug management. Parenteral controlled substances.   Patient presented to the ED for evaluation of sickle cell pain.  Patient has known history of chronic pain and sickle cell pain crises.  Patient ran out of his prescribed medications.  ED workup reassuring.  No signs to suggest acute infection.  No signs of severe anemia.  Unclear if this is a sickle cell pain crisis versus recurrence of his chronic pain syndrome.  Patient treated with IV pain medications.  Gums have improved.  Stable for discharge to follow-up with the sickle cell clinic.        Final Clinical Impression(s) / ED Diagnoses Final diagnoses:  Sickle cell pain crisis [facility])    Rx / DC Orders ED Discharge Orders     None         Linwood Dibbles, MD 05/20/23 1436

## 2023-05-20 NOTE — Telephone Encounter (Signed)
Pt requesting refill on oxycodone

## 2023-05-25 ENCOUNTER — Other Ambulatory Visit: Payer: Self-pay

## 2023-05-25 ENCOUNTER — Encounter (HOSPITAL_COMMUNITY): Payer: Self-pay | Admitting: Emergency Medicine

## 2023-05-25 ENCOUNTER — Emergency Department (HOSPITAL_COMMUNITY)
Admission: EM | Admit: 2023-05-25 | Discharge: 2023-05-26 | Disposition: A | Discharge status: Home / Self Care | Payer: 59 | Attending: Emergency Medicine | Admitting: Emergency Medicine

## 2023-05-25 DIAGNOSIS — D5700 Hb-SS disease with crisis, unspecified: Secondary | ICD-10-CM | POA: Diagnosis present

## 2023-05-25 DIAGNOSIS — Z79899 Other long term (current) drug therapy: Secondary | ICD-10-CM | POA: Insufficient documentation

## 2023-05-25 LAB — CBC WITH DIFFERENTIAL/PLATELET
Abs Immature Granulocytes: 0.04 10*3/uL (ref 0.00–0.07)
Basophils Absolute: 0 10*3/uL (ref 0.0–0.1)
Basophils Relative: 0 %
Eosinophils Absolute: 0.1 10*3/uL (ref 0.0–0.5)
Eosinophils Relative: 1 %
HCT: 37.6 % — ABNORMAL LOW (ref 39.0–52.0)
Hemoglobin: 13.7 g/dL (ref 13.0–17.0)
Immature Granulocytes: 0 %
Lymphocytes Relative: 36 %
Lymphs Abs: 3.6 10*3/uL (ref 0.7–4.0)
MCH: 28.8 pg (ref 26.0–34.0)
MCHC: 36.4 g/dL — ABNORMAL HIGH (ref 30.0–36.0)
MCV: 79.2 fL — ABNORMAL LOW (ref 80.0–100.0)
Monocytes Absolute: 0.9 10*3/uL (ref 0.1–1.0)
Monocytes Relative: 9 %
Neutro Abs: 5.5 10*3/uL (ref 1.7–7.7)
Neutrophils Relative %: 54 %
Platelets: 118 10*3/uL — ABNORMAL LOW (ref 150–400)
RBC: 4.75 MIL/uL (ref 4.22–5.81)
RDW: 14.3 % (ref 11.5–15.5)
WBC: 10.1 10*3/uL (ref 4.0–10.5)
nRBC: 0.4 % — ABNORMAL HIGH (ref 0.0–0.2)

## 2023-05-25 LAB — RETICULOCYTES
Immature Retic Fract: 21.5 % — ABNORMAL HIGH (ref 2.3–15.9)
RBC.: 4.79 MIL/uL (ref 4.22–5.81)
Retic Count, Absolute: 178.2 10*3/uL (ref 19.0–186.0)
Retic Ct Pct: 3.7 % — ABNORMAL HIGH (ref 0.4–3.1)

## 2023-05-25 LAB — COMPREHENSIVE METABOLIC PANEL
ALT: 17 U/L (ref 0–44)
AST: 10 U/L — ABNORMAL LOW (ref 15–41)
Albumin: 4.6 g/dL (ref 3.5–5.0)
Alkaline Phosphatase: 52 U/L (ref 38–126)
Anion gap: 9 (ref 5–15)
BUN: 12 mg/dL (ref 6–20)
CO2: 23 mmol/L (ref 22–32)
Calcium: 9.6 mg/dL (ref 8.9–10.3)
Chloride: 102 mmol/L (ref 98–111)
Creatinine, Ser: 0.84 mg/dL (ref 0.61–1.24)
GFR, Estimated: >60 mL/min (ref >60)
Glucose, Bld: 91 mg/dL (ref 70–99)
Potassium: 3.9 mmol/L (ref 3.5–5.1)
Sodium: 134 mmol/L — ABNORMAL LOW (ref 135–145)
Total Bilirubin: 1.7 mg/dL — ABNORMAL HIGH (ref 0.3–1.2)
Total Protein: 8 g/dL (ref 6.5–8.1)

## 2023-05-25 MED ORDER — HYDROMORPHONE HCL 2 MG/ML IJ SOLN
2.0000 mg | INTRAMUSCULAR | Status: AC
  Administered 2023-05-26: 2 mg via INTRAVENOUS
  Filled 2023-05-25: qty 1

## 2023-05-25 MED ORDER — HYDROMORPHONE HCL 2 MG/ML IJ SOLN
2.0000 mg | INTRAMUSCULAR | Status: AC
Start: 2023-05-26 — End: 2023-05-26
  Administered 2023-05-26: 2 mg via INTRAVENOUS
  Filled 2023-05-25: qty 1

## 2023-05-25 MED ORDER — DIPHENHYDRAMINE HCL 25 MG PO CAPS
25.0000 mg | ORAL_CAPSULE | ORAL | Status: DC | PRN
  Administered 2023-05-26: 50 mg via ORAL
  Filled 2023-05-25: qty 2

## 2023-05-25 MED ORDER — KETOROLAC TROMETHAMINE 15 MG/ML IJ SOLN
15.0000 mg | INTRAMUSCULAR | Status: AC
  Administered 2023-05-25: 15 mg via INTRAVENOUS
  Filled 2023-05-25: qty 1

## 2023-05-25 MED ORDER — HYDROMORPHONE HCL 2 MG/ML IJ SOLN
2.0000 mg | INTRAMUSCULAR | Status: AC
  Administered 2023-05-25: 2 mg via INTRAVENOUS
  Filled 2023-05-25: qty 1

## 2023-05-25 MED ORDER — ONDANSETRON HCL 4 MG/2ML IJ SOLN
4.0000 mg | INTRAMUSCULAR | Status: DC | PRN
  Administered 2023-05-26: 4 mg via INTRAVENOUS
  Filled 2023-05-25: qty 2

## 2023-05-25 NOTE — ED Provider Notes (Addendum)
[place] EMERGENCY DEPARTMENT AT [facility] Provider Note   CSN: [id number] Arrival date & time: 05/25/23  2141     History  Chief Complaint  Patient presents with   Sickle Cell Pain Crisis    Austin [name] is a 32 y.o. male.  The history is provided by the patient and medical records.  Sickle Cell Pain Crisis  32 year old male with history of sickle cell disease, vitamin D deficiency, eczema, presenting to the ED for pain crisis.  Patient reports pain began this afternoon, steadily worsening as the day went on.  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  Started feeling some achiness in his legs today which progressively worsened.  He tried drinking water along his route but did not have any significant improvement.  He is currently out of his home pain medications, send a refill request to his PCP last week but this was never sent into pharmacy.  He has an appointment in the morning but does not have any medications at home to try and help manage his pain.  He reports pain in his legs and low back which is typical for him.  He denies any chest pain or shortness of breath.  No numbness or weakness.  No bowel or bladder incontinence.  Home Medications Prior to Admission medications   Medication Sig Start Date End Date Taking? Authorizing Provider  bacitracin ointment Apply 1 Application topically 2 (two) times daily. 04/11/23   Henderly, Britni A, PA-C  dicyclomine (BENTYL) 20 MG tablet TAKE 1 TABLET BY MOUTH TWICE A DAY Patient not taking: Reported on 02/28/2023 03/25/22   Quentin Angst, MD  folic acid (FOLVITE) 1 MG tablet Take 1 tablet (1 mg total) by mouth daily. 08/12/22 08/12/23  Massie Maroon, FNP  loratadine (CLARITIN) 10 MG tablet Take 1 tablet (10 mg total) by mouth daily. Patient taking differently: Take 10 mg by mouth daily as needed for allergies or rhinitis. 11/13/20   Barbette Merino, NP   naproxen (NAPROSYN) 500 MG tablet TAKE 1 TABLET BY MOUTH 2 TIMES DAILY WITH A MEAL. Patient taking differently: Take 500 mg by mouth 2 (two) times daily as needed for moderate pain. 11/10/22   Ivonne Andrew, NP  ondansetron (ZOFRAN) 4 MG tablet Take 1 tablet (4 mg total) by mouth every 6 (six) hours. Patient not taking: Reported on 03/30/2023 03/01/23   Carroll Sage, PA-C  ondansetron (ZOFRAN-ODT) 4 MG disintegrating tablet Dissolve 1 tablet (4 mg total) by mouth every 6 (six) hours as needed for nausea or vomiting. 04/14/23   Massie Maroon, FNP  Oxycodone HCl 10 MG TABS Take 1 tablet (10 mg) by mouth every 6 hours as needed. 05/03/23   Massie Maroon, FNP  promethazine (PHENERGAN) 25 MG tablet Take 1 tablet (25 mg total) by mouth every 6 (six) hours as needed for nausea or vomiting. 02/02/23   Derwood Kaplan, MD  promethazine (PHENERGAN) 25 MG tablet Take 1 tablet (25 mg total) by mouth every 6 (six) hours as needed for nausea or vomiting. 04/15/23   Vanetta Mulders, MD  sildenafil (VIAGRA) 100 MG tablet Take 1 tablet (100 mg) by mouth daily as needed (for E.D.). 03/29/23   Massie Maroon, FNP  triamcinolone (NASACORT) 55 MCG/ACT AERO nasal inhaler Place 2 sprays into the nose daily. 2 sprays each nostril at night Patient not taking: Reported on 10/14/2022 04/27/21  Drema Halon, MD  Vitamin D, Ergocalciferol, (DRISDOL) 1.25 MG (50000 UNIT) CAPS capsule TAKE 1 CAPSULE (50,000 UNITS TOTAL) BY MOUTH ONCE A WEEK. X 12 WEEKS. Patient taking differently: Take 50,000 Units by mouth every Thursday. 04/13/22   Ivonne Andrew, NP      Allergies    Patient has no known allergies.    Review of Systems   Review of Systems  Musculoskeletal:  Positive for arthralgias and myalgias.  All other systems reviewed and are negative.   Physical Exam Updated Vital Signs BP 129/89   Pulse 74   Temp 98 F (36.7 C)   Resp 16   SpO2 99%   Physical Exam Vitals and nursing note reviewed.   Constitutional:      Appearance: He is well-developed.  HENT:     Head: Normocephalic and atraumatic.  Eyes:     Conjunctiva/sclera: Conjunctivae normal.     Pupils: Pupils are equal, round, and reactive to light.  Cardiovascular:     Rate and Rhythm: Normal rate and regular rhythm.     Heart sounds: Normal heart sounds.  Pulmonary:     Effort: Pulmonary effort is normal.     Breath sounds: Normal breath sounds.  Abdominal:     General: Bowel sounds are normal.     Palpations: Abdomen is soft.  Musculoskeletal:        General: Normal range of motion.     Cervical back: Normal range of motion.     Comments: No swelling or acute deformities noted to the legs no strength or sensory deficit, remains ambulatory  Skin:    General: Skin is warm and dry.  Neurological:     Mental Status: He is alert and oriented to person, place, and time.     ED Results / Procedures / Treatments   Labs (all labs ordered are listed, but only abnormal results are displayed) Labs Reviewed  COMPREHENSIVE METABOLIC PANEL - Abnormal; Notable for the following components:      Result Value   Sodium 134 (*)    AST 10 (*)    Total Bilirubin 1.7 (*)    All other components within normal limits  CBC WITH DIFFERENTIAL/PLATELET - Abnormal; Notable for the following components:   HCT 37.6 (*)    MCV 79.2 (*)    MCHC 36.4 (*)    Platelets 118 (*)    nRBC 0.4 (*)    All other components within normal limits  RETICULOCYTES - Abnormal; Notable for the following components:   Retic Ct Pct 3.7 (*)    Immature Retic Fract 21.5 (*)    All other components within normal limits    EKG None  Radiology No results found.  Procedures Procedures   CRITICAL CARE Performed by: Garlon Hatchet   Total critical care time: 35 minutes  Critical care time was exclusive of separately billable procedures and treating other patients.  Critical care was necessary to treat or prevent imminent or life-threatening  deterioration.  Critical care was time spent personally by me on the following activities: development of treatment plan with patient and/or surrogate as well as nursing, discussions with consultants, evaluation of patient's response to treatment, examination of patient, obtaining history from patient or surrogate, ordering and performing treatments and interventions, ordering and review of laboratory studies, ordering and review of radiographic studies, pulse oximetry and re-evaluation of patient's condition.    Medications Ordered in ED Medications  diphenhydrAMINE (BENADRYL) capsule 25-50 mg (50 mg Oral  Given 05/26/23 0003)  ondansetron (ZOFRAN) injection 4 mg (4 mg Intravenous Given 05/26/23 0003)  HYDROmorphone (DILAUDID) injection 2 mg (2 mg Intravenous Given 05/25/23 2311)  HYDROmorphone (DILAUDID) injection 2 mg (2 mg Intravenous Given 05/26/23 0003)  HYDROmorphone (DILAUDID) injection 2 mg (2 mg Intravenous Given 05/26/23 0107)  ketorolac (TORADOL) 15 MG/ML injection 15 mg (15 mg Intravenous Given 05/25/23 2309)    ED Course/ Medical Decision Making/ A&P                                 Medical Decision Making Amount and/or Complexity of Data Reviewed Labs: ordered. ECG/medicine tests: ordered and independent interpretation performed.  Risk Prescription drug management.   32 year old male presenting to the ED with sickle cell pain crisis.  Pain began this afternoon and progressively worsened throughout the evening.  Pain mostly in the back and legs which is typical for him.  He denies any chest pain, shortness of breath, fever, or chills.  He is in no acute distress on my exam, vitals are stable.  Labs were obtained which are grossly reassuring--he has no profound anemia, no leukocytosis.  No significant electrolyte derangement.  After medications here he is feeling significantly improved.  He feels like he can manage his symptoms until appointment with PCP in the morning.   Stable for discharge.  Can return here for new concerns.  Final Clinical Impression(s) / ED Diagnoses Final diagnoses:  Sickle cell pain crisis [facility])    Rx / DC Orders ED Discharge Orders     None         Garlon Hatchet, PA-C 05/26/23 0234    Garlon Hatchet, PA-C 05/26/23 0234    Charlynne Pander, MD 05/27/23 [phone]

## 2023-05-25 NOTE — ED Triage Notes (Addendum)
  Patient comes in with sickle cell pain crisis that started while at work earlier today.  Patient endorses sharp/stabbing pain in lower back and bilateral legs.  Pain 8/10.  Denies any SOB, or chest pain.  A&O x4.   Patient was seen on 10/25 for same and has attempted to get refills for prescription pain medications.  Has PCP appointment in the morning but could not get pain under control.

## 2023-05-26 ENCOUNTER — Other Ambulatory Visit (HOSPITAL_COMMUNITY): Payer: Self-pay

## 2023-05-26 ENCOUNTER — Ambulatory Visit (INDEPENDENT_AMBULATORY_CARE_PROVIDER_SITE_OTHER): Payer: Self-pay | Admitting: Nurse Practitioner

## 2023-05-26 ENCOUNTER — Encounter: Payer: Self-pay | Admitting: Nurse Practitioner

## 2023-05-26 VITALS — BP 122/79 | HR 88 | Ht 67.0 in | Wt 172.0 lb

## 2023-05-26 DIAGNOSIS — D5700 Hb-SS disease with crisis, unspecified: Secondary | ICD-10-CM | POA: Diagnosis not present

## 2023-05-26 DIAGNOSIS — F1190 Opioid use, unspecified, uncomplicated: Secondary | ICD-10-CM | POA: Diagnosis not present

## 2023-05-26 DIAGNOSIS — D571 Sickle-cell disease without crisis: Secondary | ICD-10-CM | POA: Diagnosis not present

## 2023-05-26 MED ORDER — OXYCODONE HCL 10 MG PO TABS
10.0000 mg | ORAL_TABLET | Freq: Four times a day (QID) | ORAL | 0 refills | Status: DC | PRN
  Filled 2023-05-26: qty 60, 15d supply, fill #0

## 2023-05-26 NOTE — Patient Instructions (Signed)
1. Sickle cell anemia without crisis (HCC)  - Oxycodone HCl 10 MG TABS; Take 1 tablet (10 mg) by mouth every 6 hours as needed.  Dispense: 60 tablet; Refill: 0  2. Chronic, continuous use of opioids   Follow up:  Follow up in 3 months

## 2023-05-26 NOTE — Discharge Instructions (Signed)
Follow-up with sickle cell clinic in the morning. Return here for new concerns.

## 2023-05-26 NOTE — Progress Notes (Signed)
Subjective   Patient ID: Austin [name], male    DOB: 04-07-1991, 32 y.o.   MRN: [id number]  Chief Complaint  Patient presents with   Follow-up    Referring provider: Massie Maroon, FNP  Austin [name] is a 32 y.o. male with Past Medical History: No date: Acute maxillary sinusitis No date: Eczema .: Sickle cell anemia (HCC)   HPI  Patient presents today for a follow-up visit.  This is a patient of Dr. Hyman Hopes.  He has had several ED visits for sickle cell crisis over the past few months.  Patient is currently taking Ativan for anxiety and states that he usually takes oxycodone HCL 10 mg for sickle cell pain. Denies f/c/s, n/v/d, hemoptysis, PND, leg swelling. Denies chest pain or edema.   No Known Allergies  Immunization History  Administered Date(s) Administered   Influenza,inj,Quad PF,6+ Mos 03/13/2018   Influenza-Unspecified 04/25/2020   PFIZER(Purple Top)SARS-COV-2 Vaccination 04/04/2020, 04/25/2020   Tdap 08/27/2015, 04/11/2023    Tobacco History: Social History   Tobacco Use  Smoking Status Former   Current packs/day: 0.00   Types: Cigarettes   Quit date: 11/23/2013   Years since quitting: 9.5  Smokeless Tobacco Never   Counseling given: Not Answered   Outpatient Encounter Medications as of 05/26/2023  Medication Sig   bacitracin ointment Apply 1 Application topically 2 (two) times daily.   dicyclomine (BENTYL) 20 MG tablet TAKE 1 TABLET BY MOUTH TWICE A DAY   folic acid (FOLVITE) 1 MG tablet Take 1 tablet (1 mg total) by mouth daily.   loratadine (CLARITIN) 10 MG tablet Take 1 tablet (10 mg total) by mouth daily. (Patient taking differently: Take 10 mg by mouth daily as needed for allergies or rhinitis.)   naproxen (NAPROSYN) 500 MG tablet TAKE 1 TABLET BY MOUTH 2 TIMES DAILY WITH A MEAL. (Patient taking differently: Take 500 mg by mouth 2 (two) times daily as needed for moderate pain (pain score 4-6).)   ondansetron (ZOFRAN) 4 MG tablet Take 1  tablet (4 mg total) by mouth every 6 (six) hours.   ondansetron (ZOFRAN-ODT) 4 MG disintegrating tablet Dissolve 1 tablet (4 mg total) by mouth every 6 (six) hours as needed for nausea or vomiting.   promethazine (PHENERGAN) 25 MG tablet Take 1 tablet (25 mg total) by mouth every 6 (six) hours as needed for nausea or vomiting.   promethazine (PHENERGAN) 25 MG tablet Take 1 tablet (25 mg total) by mouth every 6 (six) hours as needed for nausea or vomiting.   sildenafil (VIAGRA) 100 MG tablet Take 1 tablet (100 mg) by mouth daily as needed (for E.D.).   triamcinolone (NASACORT) 55 MCG/ACT AERO nasal inhaler Place 2 sprays into the nose daily. 2 sprays each nostril at night   Vitamin D, Ergocalciferol, (DRISDOL) 1.25 MG (50000 UNIT) CAPS capsule TAKE 1 CAPSULE (50,000 UNITS TOTAL) BY MOUTH ONCE A WEEK. X 12 WEEKS. (Patient taking differently: Take 50,000 Units by mouth every Thursday.)   [DISCONTINUED] Oxycodone HCl 10 MG TABS Take 1 tablet (10 mg) by mouth every 6 hours as needed.   Oxycodone HCl 10 MG TABS Take 1 tablet (10 mg) by mouth every 6 hours as needed.   No facility-administered encounter medications on file as of 05/26/2023.    Review of Systems  Review of Systems  Constitutional: Negative.   HENT: Negative.    Cardiovascular: Negative.   Gastrointestinal: Negative.   Allergic/Immunologic: Negative.   Neurological: Negative.   Psychiatric/Behavioral: Negative.  Objective:   BP 122/79 (BP Location: Right Arm, Patient Position: Sitting, Cuff Size: Normal)   Pulse 88   Ht 5\' 7"  (1.702 m)   Wt 172 lb (78 kg)   SpO2 95%   BMI 26.94 kg/m   Wt Readings from Last 5 Encounters:  05/26/23 172 lb (78 kg)  05/20/23 165 lb (74.8 kg)  04/18/23 160 lb (72.6 kg)  04/15/23 164 lb (74.4 kg)  04/11/23 164 lb (74.4 kg)     Physical Exam Vitals and nursing note reviewed.  Constitutional:      General: He is not in acute distress.    Appearance: He is well-developed.   Cardiovascular:     Rate and Rhythm: Normal rate and regular rhythm.  Pulmonary:     Effort: Pulmonary effort is normal.     Breath sounds: Normal breath sounds.  Skin:    General: Skin is warm and dry.  Neurological:     Mental Status: He is alert and oriented to person, place, and time.       Assessment & Plan:   Sickle cell anemia without crisis (HCC) -     Sickle Cell Panel -     Drug Screen 11 w/Conf, Ser -     oxyCODONE HCl; Take 1 tablet (10 mg) by mouth every 6 hours as needed.  Dispense: 60 tablet; Refill: 0  Chronic, continuous use of opioids -     Sickle Cell Panel -     Drug Screen 11 w/Conf, Ser     Return in about 3 months (around 08/26/2023).   Ivonne Andrew, NP 05/26/2023

## 2023-05-31 ENCOUNTER — Other Ambulatory Visit: Payer: Self-pay | Admitting: Family Medicine

## 2023-05-31 ENCOUNTER — Other Ambulatory Visit (HOSPITAL_COMMUNITY): Payer: Self-pay

## 2023-05-31 MED ORDER — SILDENAFIL CITRATE 100 MG PO TABS
100.0000 mg | ORAL_TABLET | Freq: Every day | ORAL | 0 refills | Status: DC | PRN
  Filled 2023-05-31: qty 6, 30d supply, fill #0
  Filled 2023-06-21: qty 4, 4d supply, fill #1

## 2023-05-31 NOTE — Telephone Encounter (Signed)
Please advise KH 

## 2023-06-07 ENCOUNTER — Emergency Department (HOSPITAL_COMMUNITY)
Admission: EM | Admit: 2023-06-07 | Discharge: 2023-06-07 | Disposition: A | Discharge status: Home / Self Care | Payer: 59 | Source: Home / Self Care

## 2023-06-07 ENCOUNTER — Non-Acute Institutional Stay (HOSPITAL_COMMUNITY)
Admission: AD | Admit: 2023-06-07 | Discharge: 2023-06-07 | Disposition: A | Discharge status: Home / Self Care | Payer: 59 | Source: Ambulatory Visit | Attending: Internal Medicine | Admitting: Internal Medicine

## 2023-06-07 ENCOUNTER — Telehealth (HOSPITAL_COMMUNITY): Payer: Self-pay | Admitting: *Deleted

## 2023-06-07 DIAGNOSIS — D638 Anemia in other chronic diseases classified elsewhere: Secondary | ICD-10-CM | POA: Diagnosis not present

## 2023-06-07 DIAGNOSIS — G894 Chronic pain syndrome: Secondary | ICD-10-CM | POA: Insufficient documentation

## 2023-06-07 DIAGNOSIS — D5700 Hb-SS disease with crisis, unspecified: Secondary | ICD-10-CM | POA: Diagnosis present

## 2023-06-07 DIAGNOSIS — F1120 Opioid dependence, uncomplicated: Secondary | ICD-10-CM | POA: Diagnosis not present

## 2023-06-07 LAB — CBC WITH DIFFERENTIAL/PLATELET
Abs Immature Granulocytes: 0.02 10*3/uL (ref 0.00–0.07)
Basophils Absolute: 0 10*3/uL (ref 0.0–0.1)
Basophils Relative: 0 %
Eosinophils Absolute: 0.1 10*3/uL (ref 0.0–0.5)
Eosinophils Relative: 1 %
HCT: 39.6 % (ref 39.0–52.0)
Hemoglobin: 14.7 g/dL (ref 13.0–17.0)
Immature Granulocytes: 0 %
Lymphocytes Relative: 27 %
Lymphs Abs: 2.5 10*3/uL (ref 0.7–4.0)
MCH: 29.2 pg (ref 26.0–34.0)
MCHC: 37.1 g/dL — ABNORMAL HIGH (ref 30.0–36.0)
MCV: 78.7 fL — ABNORMAL LOW (ref 80.0–100.0)
Monocytes Absolute: 0.9 10*3/uL (ref 0.1–1.0)
Monocytes Relative: 9 %
Neutro Abs: 5.9 10*3/uL (ref 1.7–7.7)
Neutrophils Relative %: 63 %
Platelets: 132 10*3/uL — ABNORMAL LOW (ref 150–400)
RBC: 5.03 MIL/uL (ref 4.22–5.81)
RDW: 14.2 % (ref 11.5–15.5)
WBC: 9.4 10*3/uL (ref 4.0–10.5)
nRBC: 0.2 % (ref 0.0–0.2)

## 2023-06-07 LAB — COMPREHENSIVE METABOLIC PANEL
ALT: 17 U/L (ref 0–44)
AST: 11 U/L — ABNORMAL LOW (ref 15–41)
Albumin: 4.9 g/dL (ref 3.5–5.0)
Alkaline Phosphatase: 49 U/L (ref 38–126)
Anion gap: 13 (ref 5–15)
BUN: 10 mg/dL (ref 6–20)
CO2: 22 mmol/L (ref 22–32)
Calcium: 9.6 mg/dL (ref 8.9–10.3)
Chloride: 99 mmol/L (ref 98–111)
Creatinine, Ser: 0.75 mg/dL (ref 0.61–1.24)
GFR, Estimated: >60 mL/min (ref >60)
Glucose, Bld: 74 mg/dL (ref 70–99)
Potassium: 4 mmol/L (ref 3.5–5.1)
Sodium: 134 mmol/L — ABNORMAL LOW (ref 135–145)
Total Bilirubin: 2.4 mg/dL — ABNORMAL HIGH (ref <1.2)
Total Protein: 8.4 g/dL — ABNORMAL HIGH (ref 6.5–8.1)

## 2023-06-07 LAB — RETICULOCYTES
Immature Retic Fract: 24.6 % — ABNORMAL HIGH (ref 2.3–15.9)
RBC.: 5.05 MIL/uL (ref 4.22–5.81)
Retic Count, Absolute: 179.8 10*3/uL (ref 19.0–186.0)
Retic Ct Pct: 3.6 % — ABNORMAL HIGH (ref 0.4–3.1)

## 2023-06-07 MED ORDER — KETOROLAC TROMETHAMINE 30 MG/ML IJ SOLN
15.0000 mg | Freq: Once | INTRAMUSCULAR | Status: AC
  Administered 2023-06-07: 15 mg via INTRAVENOUS
  Filled 2023-06-07: qty 1

## 2023-06-07 MED ORDER — ACETAMINOPHEN 500 MG PO TABS
1000.0000 mg | ORAL_TABLET | Freq: Once | ORAL | Status: AC
  Administered 2023-06-07: 1000 mg via ORAL
  Filled 2023-06-07: qty 2

## 2023-06-07 MED ORDER — ONDANSETRON HCL 4 MG/2ML IJ SOLN
4.0000 mg | Freq: Four times a day (QID) | INTRAMUSCULAR | Status: DC | PRN
  Administered 2023-06-07: 4 mg via INTRAVENOUS
  Filled 2023-06-07: qty 2

## 2023-06-07 MED ORDER — SODIUM CHLORIDE 0.45 % IV SOLN: INTRAVENOUS | Status: DC

## 2023-06-07 MED ORDER — SODIUM CHLORIDE 0.9% FLUSH: 9.0000 mL | INTRAVENOUS | Status: DC | PRN

## 2023-06-07 MED ORDER — HYDROMORPHONE 1 MG/ML IV SOLN
INTRAVENOUS | Status: DC
  Administered 2023-06-07: 7 mg via INTRAVENOUS
  Administered 2023-06-07: 30 mg via INTRAVENOUS
  Filled 2023-06-07: qty 30

## 2023-06-07 MED ORDER — DIPHENHYDRAMINE HCL 25 MG PO CAPS: 25.0000 mg | ORAL_CAPSULE | ORAL | Status: DC | PRN

## 2023-06-07 MED ORDER — NALOXONE HCL 0.4 MG/ML IJ SOLN: 0.4000 mg | INTRAMUSCULAR | Status: DC | PRN

## 2023-06-07 NOTE — Discharge Summary (Signed)
[facility] Discharge Summary   Patient ID: Austin [name] MRN: [id number] DOB/AGE: [birth year] 32 y.o.  Admit date: 06/07/2023 Discharge date: 06/07/2023  Primary Care Physician:  Massie Maroon, FNP  Admission Diagnoses:  Active Problems:   Sickle cell pain crisis [facility])   Discharge Medications:  Allergies as of 06/07/2023   No Known Allergies      Medication List     TAKE these medications    bacitracin ointment Apply 1 Application topically 2 (two) times daily.   dicyclomine 20 MG tablet Commonly known as: BENTYL TAKE 1 TABLET BY MOUTH TWICE A DAY   folic acid 1 MG tablet Commonly known as: FOLVITE Take 1 tablet (1 mg total) by mouth daily.   loratadine 10 MG tablet Commonly known as: CLARITIN Take 1 tablet (10 mg total) by mouth daily. What changed:  when to take this reasons to take this   naproxen 500 MG tablet Commonly known as: NAPROSYN TAKE 1 TABLET BY MOUTH 2 TIMES DAILY WITH A MEAL. What changed:  when to take this reasons to take this   ondansetron 4 MG disintegrating tablet Commonly known as: ZOFRAN-ODT Dissolve 1 tablet (4 mg total) by mouth every 6 (six) hours as needed for nausea or vomiting.   ondansetron 4 MG tablet Commonly known as: ZOFRAN Take 1 tablet (4 mg total) by mouth every 6 (six) hours.   Oxycodone HCl 10 MG Tabs Take 1 tablet (10 mg) by mouth every 6 hours as needed.   promethazine 25 MG tablet Commonly known as: PHENERGAN Take 1 tablet (25 mg total) by mouth every 6 (six) hours as needed for nausea or vomiting.   promethazine 25 MG tablet Commonly known as: PHENERGAN Take 1 tablet (25 mg total) by mouth every 6 (six) hours as needed for nausea or vomiting.   sildenafil 100 MG tablet Commonly known as: VIAGRA Take 1 tablet (100 mg) by mouth daily as needed (for E.D.).   triamcinolone 55 MCG/ACT Aero nasal inhaler Commonly known as: NASACORT Place 2 sprays into the nose daily. 2 sprays each  nostril at night   Vitamin D (Ergocalciferol) 1.25 MG (50000 UNIT) Caps capsule Commonly known as: DRISDOL TAKE 1 CAPSULE (50,000 UNITS TOTAL) BY MOUTH ONCE A WEEK. X 12 WEEKS. What changed: See the new instructions.         Consults:  None  Significant Diagnostic Studies:  No results found.  History of present illness: Austin [name] is a very pleasant 32 year old male with a medical history significant for sickle cell disease, chronic pain syndrome, opiate dependence and tolerance, and anemia of chronic disease presents with complaints of generalized pain that is consistent with his typical sickle cell pain crisis.  Patient states that pain intensity increased on yesterday and was uncontrolled by his home medication.  Patient attributes his pain crisis to working a very physical job with extreme weather exposure.  He rates his pain as 10/10, constant, and aching.  Patient last had oxycodone this a.m. with very little relief.  He denies any fever, chills, chest pain, dizziness, or shortness of breath.  No urinary symptoms, nausea, vomiting, or diarrhea.  No sick contacts or recent travel. [facility] Course: Patient admitted to sickle cell day infusion clinic for management of pain crisis. Reviewed labs, consistent with his baseline. Pain managed with IV Dilaudid PCA, IV Toradol, Tylenol, and IV fluids. Patient states that pain is resolved.  Patient will resume his home medication.  Advised to  follow-up with primary care for medication management. He is alert, oriented, and ambulating without assistance.  Patient will discharge home in a hemodynamically stable condition.  Discharge instructions: Resume all home medications.   Follow up with PCP as previously  scheduled.   Discussed the importance of drinking 64 ounces of water daily, dehydration of red blood cells may lead further sickling.   Avoid all stressors that precipitate sickle cell pain crisis.     The  patient was given clear instructions to go to ER or return to medical center if symptoms do not improve, worsen or new problems develop.   Physical Exam at Discharge:  BP 103/72 (BP Location: Right Arm)   Pulse 74   Temp 98.6 F (37 C) (Temporal)   Resp 13   SpO2 98%  Physical Exam Constitutional:      Appearance: Normal appearance.  Eyes:     Pupils: Pupils are equal, round, and reactive to light.  Cardiovascular:     Rate and Rhythm: Normal rate and regular rhythm.  Pulmonary:     Effort: Pulmonary effort is normal.  Abdominal:     General: Bowel sounds are normal.  Musculoskeletal:        General: Normal range of motion.  Skin:    General: Skin is warm.  Neurological:     General: No focal deficit present.     Mental Status: He is alert. Mental status is at baseline.  Psychiatric:        Mood and Affect: Mood normal.        Behavior: Behavior normal.        Thought Content: Thought content normal.        Judgment: Judgment normal.      Disposition at Discharge: Discharge disposition: 01-Home or Self Care       Discharge Orders: Discharge Instructions     Discharge patient   Complete by: As directed    Discharge disposition: 01-Home or Self Care   Discharge patient date: 06/07/2023       Condition at Discharge:   Stable  Time spent on Discharge:  Greater than 30 minutes.  Signed: Nolon Nations  APRN, MSN, FNP-C Patient Care [facility] [address] [place], [place] [address] [phone]  06/07/2023, 4:32 PM

## 2023-06-07 NOTE — Progress Notes (Signed)
Patient admitted to the day hospital for sickle cell pain. Initially, patient reported bilateral leg and back pain rated 8/10. For pain management, patient placed on Sickle Cell Dose Dilaudid PCA, given 15 mg IV Toradol, 1000 mg Tylenol and hydrated with IV fluids. At discharge, patient rated pain 1/10. Vital signs stable. AVS offered but patient refused. Patient alert, oriented and ambulatory at discharge.

## 2023-06-07 NOTE — Telephone Encounter (Signed)
Patient called requesting to come to the day hospital for sickle cell pain. Patient reports lower back and bilateral leg pain rated 8/10. Reports last taking Oxycodone 10 mg around 1 hour ago.  COVID-19 screening done and patient denies all symptoms and exposures. Denies fever, chest pain, nausea, vomiting, diarrhea, abdominal pain and priapism.  [place], FNP notified. While waiting to hear back from provider, the patient came to the day hospital lobby.  Provider was unable to immediately respond so patient left and went to the ED. Called patient and notified him that provider said he could come to the day hospital. Patient will check out of ED and come to the day hospital.

## 2023-06-07 NOTE — H&P (Signed)
[facility] History and Physical   Date: 06/07/2023  Patient name: Austin [name] Medical record number: [id number] Date of birth: Jul 28, 1990 Age: 32 y.o. Gender: male PCP: Massie Maroon, FNP  Attending physician: No att. providers found  Chief Complaint: Sickle cell pain  History of present illness: Austin [name] is a very pleasant 32 year old male with a medical history significant for sickle cell disease, chronic pain syndrome, opiate dependence and tolerance, and anemia of chronic disease presents with complaints of generalized pain that is consistent with his typical sickle cell pain crisis.  Patient states that pain intensity increased on yesterday and was uncontrolled by his home medication.  Patient attributes his pain crisis to working a very physical job with extreme weather exposure.  He rates his pain as 10/10, constant, and aching.  Patient last had oxycodone this a.m. with very little relief.  He denies any fever, chills, chest pain, dizziness, or shortness of breath.  No urinary symptoms, nausea, vomiting, or diarrhea.  No sick contacts or recent travel.   Meds: No medications prior to admission.    Allergies: Patient has no known allergies. Past Medical History:  Diagnosis Date   Acute maxillary sinusitis    Eczema    Sickle cell anemia (HCC) .   Past Surgical History:  Procedure Laterality Date   FOOT SURGERY Left 12/25/2020   WISDOM TOOTH EXTRACTION     Family History  Problem Relation Age of Onset   Sickle cell trait Mother    Diabetes Father    Hypertension Father    Social History   Socioeconomic History   Marital status: Married    Spouse name: Not on file   Number of children: Not on file   Years of education: Not on file   Highest education level: Not on file  Occupational History   Not on file  Tobacco Use   Smoking status: Former    Current packs/day: 0.00    Types: Cigarettes    Quit date: 11/23/2013    Years  since quitting: 9.5   Smokeless tobacco: Never  Vaping Use   Vaping status: Never Used  Substance and Sexual Activity   Alcohol use: Yes    Alcohol/week: 1.0 standard drink of alcohol    Types: 1 Standard drinks or equivalent per week   Drug use: Yes    Types: Marijuana    Comment: he states "I smoke weed every day"   Sexual activity: Yes    Birth control/protection: None    Comment: Married  Other Topics Concern   Not on file  Social History Narrative   Not on file   Social Determinants of Health   Financial Resource Strain: Patient Declined (05/26/2023)   Overall Financial Resource Strain (CARDIA)    Difficulty of Paying Living Expenses: Patient declined  Food Insecurity: Patient Declined (05/26/2023)   Hunger Vital Sign    Worried About Running Out of Food in the Last Year: Patient declined    Ran Out of Food in the Last Year: Patient declined  Transportation Needs: Patient Declined (05/26/2023)   PRAPARE - Administrator, Civil Service (Medical): Patient declined    Lack of Transportation (Non-Medical): Patient declined  Physical Activity: Unknown (05/26/2023)   Exercise Vital Sign    Days of Exercise per Week: Patient declined    Minutes of Exercise per Session: Not on file  Stress: Patient Declined (05/26/2023)   [employer] of Occupational Health - Occupational Stress Questionnaire  Feeling of Stress : Patient declined  Social Connections: Unknown (05/26/2023)   Social Connection and Isolation Panel [NHANES]    Frequency of Communication with Friends and Family: Patient declined    Frequency of Social Gatherings with Friends and Family: Patient declined    Attends Religious Services: Patient declined    Database administrator or Organizations: Patient declined    Attends Banker Meetings: Not on file    Marital Status: Patient declined  Intimate Partner Violence: Not At Risk (10/15/2022)   Humiliation, Afraid, Rape, and Kick  questionnaire    Fear of Current or Ex-Partner: No    Emotionally Abused: No    Physically Abused: No    Sexually Abused: No   Review of Systems  Constitutional:  Negative for chills and fever.  HENT: Negative.    Eyes: Negative.   Respiratory: Negative.    Cardiovascular: Negative.   Gastrointestinal: Negative.   Genitourinary: Negative.   Musculoskeletal:  Positive for back pain and joint pain.  Skin: Negative.   Neurological: Negative.   Psychiatric/Behavioral: Negative.      Physical Exam: Blood pressure 103/72, pulse 74, temperature 98.6 F (37 C), temperature source Temporal, resp. rate 13, SpO2 98%. Physical Exam Constitutional:      Appearance: Normal appearance.  Eyes:     Pupils: Pupils are equal, round, and reactive to light.  Cardiovascular:     Rate and Rhythm: Normal rate and regular rhythm.  Pulmonary:     Effort: Pulmonary effort is normal.  Abdominal:     General: Bowel sounds are normal.     Palpations: Abdomen is soft.  Musculoskeletal:        General: Normal range of motion.  Skin:    General: Skin is warm.  Neurological:     General: No focal deficit present.     Mental Status: Mental status is at baseline.  Psychiatric:        Mood and Affect: Mood normal.        Behavior: Behavior normal.        Thought Content: Thought content normal.        Judgment: Judgment normal.      Lab results: Results for orders placed or performed during the hospital encounter of 06/07/23 (from the past 24 hour(s))  Comprehensive metabolic panel     Status: Abnormal   Collection Time: 06/07/23 12:05 PM  Result Value Ref Range   Sodium 134 (L) 135 - 145 mmol/L   Potassium 4.0 3.5 - 5.1 mmol/L   Chloride 99 98 - 111 mmol/L   CO2 22 22 - 32 mmol/L   Glucose, Bld 74 70 - 99 mg/dL   BUN 10 6 - 20 mg/dL   Creatinine, Ser 9.62 0.61 - 1.24 mg/dL   Calcium 9.6 8.9 - 95.2 mg/dL   Total Protein 8.4 (H) 6.5 - 8.1 g/dL   Albumin 4.9 3.5 - 5.0 g/dL   AST 11 (L) 15  - 41 U/L   ALT 17 0 - 44 U/L   Alkaline Phosphatase 49 38 - 126 U/L   Total Bilirubin 2.4 (H) <1.2 mg/dL   GFR, Estimated >84 >13 mL/min   Anion gap 13 5 - 15  CBC with Differential/Platelet     Status: Abnormal   Collection Time: 06/07/23 12:05 PM  Result Value Ref Range   WBC 9.4 4.0 - 10.5 K/uL   RBC 5.03 4.22 - 5.81 MIL/uL   Hemoglobin 14.7 13.0 - 17.0 g/dL  HCT 39.6 39.0 - 52.0 %   MCV 78.7 (L) 80.0 - 100.0 fL   MCH 29.2 26.0 - 34.0 pg   MCHC 37.1 (H) 30.0 - 36.0 g/dL   RDW 16.1 09.6 - 04.5 %   Platelets 132 (L) 150 - 400 K/uL   nRBC 0.2 0.0 - 0.2 %   Neutrophils Relative % 63 %   Neutro Abs 5.9 1.7 - 7.7 K/uL   Lymphocytes Relative 27 %   Lymphs Abs 2.5 0.7 - 4.0 K/uL   Monocytes Relative 9 %   Monocytes Absolute 0.9 0.1 - 1.0 K/uL   Eosinophils Relative 1 %   Eosinophils Absolute 0.1 0.0 - 0.5 K/uL   Basophils Relative 0 %   Basophils Absolute 0.0 0.0 - 0.1 K/uL   Immature Granulocytes 0 %   Abs Immature Granulocytes 0.02 0.00 - 0.07 K/uL  Reticulocytes     Status: Abnormal   Collection Time: 06/07/23 12:05 PM  Result Value Ref Range   Retic Ct Pct 3.6 (H) 0.4 - 3.1 %   RBC. 5.05 4.22 - 5.81 MIL/uL   Retic Count, Absolute 179.8 19.0 - 186.0 K/uL   Immature Retic Fract 24.6 (H) 2.3 - 15.9 %    Imaging results:  No results found.   Assessment & Plan: Patient admitted to sickle cell day infusion center for management of pain crisis.  Patient is opiate tolerant Initiate IV dilaudid PCA.  Toradol 15 mg IV times one dose Tylenol 1000 mg by mouth times one dose Review CBC with differential, complete metabolic panel, and reticulocytes as results become available. Pain intensity will be reevaluated in context of functioning and relationship to baseline as care progresses If pain intensity remains elevated and/or sudden change in hemodynamic stability transition to inpatient services for higher level of care.     Nolon Nations  APRN, MSN, FNP-C Patient  Care [facility] [address] [place], [place] [address] [phone]  06/07/2023, 4:38 PM

## 2023-06-08 ENCOUNTER — Other Ambulatory Visit (HOSPITAL_COMMUNITY): Payer: Self-pay

## 2023-06-08 ENCOUNTER — Other Ambulatory Visit: Payer: Self-pay | Admitting: Nurse Practitioner

## 2023-06-08 DIAGNOSIS — D571 Sickle-cell disease without crisis: Secondary | ICD-10-CM

## 2023-06-08 MED ORDER — OXYCODONE HCL 10 MG PO TABS
10.0000 mg | ORAL_TABLET | Freq: Four times a day (QID) | ORAL | 0 refills | Status: DC | PRN
  Filled 2023-06-10: qty 60, 15d supply, fill #0

## 2023-06-10 ENCOUNTER — Other Ambulatory Visit (HOSPITAL_COMMUNITY): Payer: Self-pay

## 2023-06-10 ENCOUNTER — Other Ambulatory Visit: Payer: Self-pay

## 2023-06-20 ENCOUNTER — Other Ambulatory Visit (HOSPITAL_COMMUNITY): Payer: Self-pay

## 2023-06-21 ENCOUNTER — Other Ambulatory Visit: Payer: Self-pay | Admitting: Family Medicine

## 2023-06-21 ENCOUNTER — Other Ambulatory Visit (HOSPITAL_COMMUNITY): Payer: Self-pay

## 2023-06-21 DIAGNOSIS — D571 Sickle-cell disease without crisis: Secondary | ICD-10-CM

## 2023-06-21 MED ORDER — OXYCODONE HCL 10 MG PO TABS
10.0000 mg | ORAL_TABLET | Freq: Four times a day (QID) | ORAL | 0 refills | Status: DC | PRN
  Filled 2023-06-25: qty 60, 15d supply, fill #0

## 2023-06-21 NOTE — Progress Notes (Signed)
Reviewed PDMP substance reporting system prior to prescribing opiate medications. No inconsistencies noted.  Meds ordered this encounter  Medications   Oxycodone HCl 10 MG TABS    Sig: Take 1 tablet (10 mg) by mouth every 6 hours as needed.    Dispense:  60 tablet    Refill:  0    Order Specific Question:   Supervising Provider    Answer:   Quentin Angst [[id number]]   Nolon Nations  APRN, MSN, FNP-C Patient Care [facility] [address] [place], [place] [address] [phone]

## 2023-06-21 NOTE — Telephone Encounter (Signed)
Please advise Kh

## 2023-06-24 ENCOUNTER — Other Ambulatory Visit (HOSPITAL_COMMUNITY): Payer: Self-pay

## 2023-06-24 ENCOUNTER — Other Ambulatory Visit: Payer: Self-pay

## 2023-06-25 ENCOUNTER — Other Ambulatory Visit: Payer: Self-pay

## 2023-06-25 ENCOUNTER — Other Ambulatory Visit (HOSPITAL_COMMUNITY): Payer: Self-pay

## 2023-06-28 ENCOUNTER — Other Ambulatory Visit: Payer: Self-pay

## 2023-06-28 ENCOUNTER — Encounter (HOSPITAL_COMMUNITY): Payer: Self-pay

## 2023-06-28 ENCOUNTER — Emergency Department (HOSPITAL_COMMUNITY): Payer: MEDICAID

## 2023-06-28 ENCOUNTER — Inpatient Hospital Stay (HOSPITAL_COMMUNITY)
Admission: EM | Admit: 2023-06-28 | Discharge: 2023-06-30 | DRG: 812 | Disposition: A | Discharge status: Home / Self Care | Payer: PRIVATE HEALTH INSURANCE | Attending: Internal Medicine | Admitting: Internal Medicine

## 2023-06-28 DIAGNOSIS — D638 Anemia in other chronic diseases classified elsewhere: Secondary | ICD-10-CM | POA: Diagnosis present

## 2023-06-28 DIAGNOSIS — Z79899 Other long term (current) drug therapy: Secondary | ICD-10-CM

## 2023-06-28 DIAGNOSIS — D72829 Elevated white blood cell count, unspecified: Secondary | ICD-10-CM | POA: Diagnosis present

## 2023-06-28 DIAGNOSIS — D5700 Hb-SS disease with crisis, unspecified: Principal | ICD-10-CM | POA: Diagnosis present

## 2023-06-28 DIAGNOSIS — Z832 Family history of diseases of the blood and blood-forming organs and certain disorders involving the immune mechanism: Secondary | ICD-10-CM | POA: Diagnosis not present

## 2023-06-28 DIAGNOSIS — Z87891 Personal history of nicotine dependence: Secondary | ICD-10-CM

## 2023-06-28 DIAGNOSIS — G894 Chronic pain syndrome: Secondary | ICD-10-CM | POA: Diagnosis present

## 2023-06-28 DIAGNOSIS — Z8249 Family history of ischemic heart disease and other diseases of the circulatory system: Secondary | ICD-10-CM | POA: Diagnosis not present

## 2023-06-28 DIAGNOSIS — F1120 Opioid dependence, uncomplicated: Secondary | ICD-10-CM | POA: Diagnosis present

## 2023-06-28 DIAGNOSIS — Z833 Family history of diabetes mellitus: Secondary | ICD-10-CM

## 2023-06-28 LAB — CBC WITH DIFFERENTIAL/PLATELET
Abs Immature Granulocytes: 0.23 10*3/uL — ABNORMAL HIGH (ref 0.00–0.07)
Basophils Absolute: 0 10*3/uL (ref 0.0–0.1)
Basophils Relative: 0 %
Eosinophils Absolute: 0 10*3/uL (ref 0.0–0.5)
Eosinophils Relative: 0 %
HCT: 44.9 % (ref 39.0–52.0)
Hemoglobin: 15.7 g/dL (ref 13.0–17.0)
Immature Granulocytes: 1 %
Lymphocytes Relative: 4 %
Lymphs Abs: 0.7 10*3/uL (ref 0.7–4.0)
MCH: 27.8 pg (ref 26.0–34.0)
MCHC: 35 g/dL (ref 30.0–36.0)
MCV: 79.5 fL — ABNORMAL LOW (ref 80.0–100.0)
Monocytes Absolute: 1 10*3/uL (ref 0.1–1.0)
Monocytes Relative: 6 %
Neutro Abs: 15 10*3/uL — ABNORMAL HIGH (ref 1.7–7.7)
Neutrophils Relative %: 89 %
Platelets: 136 10*3/uL — ABNORMAL LOW (ref 150–400)
RBC: 5.65 MIL/uL (ref 4.22–5.81)
RDW: 13.5 % (ref 11.5–15.5)
WBC: 17 10*3/uL — ABNORMAL HIGH (ref 4.0–10.5)
nRBC: 0.3 % — ABNORMAL HIGH (ref 0.0–0.2)

## 2023-06-28 LAB — RETICULOCYTES
Immature Retic Fract: 26.2 % — ABNORMAL HIGH (ref 2.3–15.9)
RBC.: 5.59 MIL/uL (ref 4.22–5.81)
Retic Count, Absolute: 219.7 10*3/uL — ABNORMAL HIGH (ref 19.0–186.0)
Retic Ct Pct: 3.9 % — ABNORMAL HIGH (ref 0.4–3.1)

## 2023-06-28 LAB — COMPREHENSIVE METABOLIC PANEL
ALT: 24 U/L (ref 0–44)
AST: 13 U/L — ABNORMAL LOW (ref 15–41)
Albumin: 5 g/dL (ref 3.5–5.0)
Alkaline Phosphatase: 56 U/L (ref 38–126)
Anion gap: 8 (ref 5–15)
BUN: 14 mg/dL (ref 6–20)
CO2: 25 mmol/L (ref 22–32)
Calcium: 9.3 mg/dL (ref 8.9–10.3)
Chloride: 100 mmol/L (ref 98–111)
Creatinine, Ser: 0.95 mg/dL (ref 0.61–1.24)
GFR, Estimated: >60 mL/min (ref >60)
Glucose, Bld: 114 mg/dL — ABNORMAL HIGH (ref 70–99)
Potassium: 3.5 mmol/L (ref 3.5–5.1)
Sodium: 133 mmol/L — ABNORMAL LOW (ref 135–145)
Total Bilirubin: 2.7 mg/dL — ABNORMAL HIGH (ref <1.2)
Total Protein: 8.5 g/dL — ABNORMAL HIGH (ref 6.5–8.1)

## 2023-06-28 MED ORDER — KETOROLAC TROMETHAMINE 15 MG/ML IJ SOLN
15.0000 mg | Freq: Four times a day (QID) | INTRAMUSCULAR | Status: DC
  Administered 2023-06-28 – 2023-06-30 (×7): 15 mg via INTRAVENOUS
  Filled 2023-06-28 (×7): qty 1

## 2023-06-28 MED ORDER — SODIUM CHLORIDE 0.9% FLUSH: 9.0000 mL | INTRAVENOUS | Status: DC | PRN

## 2023-06-28 MED ORDER — HYDROMORPHONE HCL 2 MG/ML IJ SOLN
2.0000 mg | INTRAMUSCULAR | Status: AC
Start: 2023-06-28 — End: 2023-06-28
  Administered 2023-06-28: 2 mg via SUBCUTANEOUS
  Filled 2023-06-28: qty 1

## 2023-06-28 MED ORDER — ONDANSETRON HCL 4 MG/2ML IJ SOLN
4.0000 mg | INTRAMUSCULAR | Status: DC | PRN
  Administered 2023-06-28: 4 mg via INTRAVENOUS
  Filled 2023-06-28: qty 2

## 2023-06-28 MED ORDER — DIPHENHYDRAMINE HCL 25 MG PO CAPS: 25.0000 mg | ORAL_CAPSULE | ORAL | Status: DC | PRN

## 2023-06-28 MED ORDER — SENNOSIDES-DOCUSATE SODIUM 8.6-50 MG PO TABS
1.0000 | ORAL_TABLET | Freq: Two times a day (BID) | ORAL | Status: DC
  Administered 2023-06-28 – 2023-06-29 (×2): 1 via ORAL
  Filled 2023-06-28 (×4): qty 1

## 2023-06-28 MED ORDER — NALOXONE HCL 0.4 MG/ML IJ SOLN: 0.4000 mg | INTRAMUSCULAR | Status: DC | PRN

## 2023-06-28 MED ORDER — ONDANSETRON HCL 4 MG/2ML IJ SOLN
4.0000 mg | Freq: Four times a day (QID) | INTRAMUSCULAR | Status: DC | PRN
  Administered 2023-06-29: 4 mg via INTRAVENOUS
  Filled 2023-06-28: qty 2

## 2023-06-28 MED ORDER — POLYETHYLENE GLYCOL 3350 17 G PO PACK
17.0000 g | PACK | Freq: Every day | ORAL | Status: DC | PRN
Start: 2023-06-28 — End: 2023-06-30

## 2023-06-28 MED ORDER — HYDROMORPHONE HCL 2 MG/ML IJ SOLN
2.0000 mg | INTRAMUSCULAR | Status: AC
  Administered 2023-06-28: 2 mg via SUBCUTANEOUS
  Filled 2023-06-28: qty 1

## 2023-06-28 MED ORDER — DICYCLOMINE HCL 20 MG PO TABS
20.0000 mg | ORAL_TABLET | Freq: Two times a day (BID) | ORAL | Status: DC
  Administered 2023-06-29 – 2023-06-30 (×3): 20 mg via ORAL
  Filled 2023-06-28 (×3): qty 1

## 2023-06-28 MED ORDER — FOLIC ACID 1 MG PO TABS
1.0000 mg | ORAL_TABLET | Freq: Every day | ORAL | Status: DC
  Administered 2023-06-29 – 2023-06-30 (×2): 1 mg via ORAL
  Filled 2023-06-28 (×2): qty 1

## 2023-06-28 MED ORDER — HYDROMORPHONE 1 MG/ML IV SOLN
INTRAVENOUS | Status: DC
Start: 2023-06-29 — End: 2023-06-30
  Administered 2023-06-29: 30 mg via INTRAVENOUS
  Filled 2023-06-28: qty 30

## 2023-06-28 MED ORDER — HYDROMORPHONE HCL 1 MG/ML IJ SOLN: 1.0000 mg | Freq: Once | INTRAMUSCULAR | Status: DC

## 2023-06-28 MED ORDER — SODIUM CHLORIDE 0.45 % IV SOLN: INTRAVENOUS | Status: DC

## 2023-06-28 MED ORDER — ENOXAPARIN SODIUM 40 MG/0.4ML IJ SOSY
40.0000 mg | PREFILLED_SYRINGE | INTRAMUSCULAR | Status: DC
  Filled 2023-06-28 (×2): qty 0.4

## 2023-06-28 MED ORDER — KETOROLAC TROMETHAMINE 15 MG/ML IJ SOLN
15.0000 mg | INTRAMUSCULAR | Status: AC
Start: 2023-06-28 — End: 2023-06-28
  Administered 2023-06-28: 15 mg via INTRAVENOUS
  Filled 2023-06-28: qty 1

## 2023-06-28 NOTE — ED Triage Notes (Signed)
Reports SCC that started today. Pain in chest and back. No fever. Reports taking his meds today for pain, no relief

## 2023-06-28 NOTE — ED Notes (Signed)
ED TO INPATIENT HANDOFF REPORT  Name/Age/Gender Austin [name] 32 y.o. male  Code Status Code Status History     Date Active Date Inactive Code Status Order ID Comments User Context   06/07/2023 1136 06/07/2023 2136 Full Code [id number]  Massie Maroon, FNP Inpatient   10/14/2022 2115 10/15/2022 1642 Full Code [id number]  Gery Pray, MD ED   10/08/2022 1547 10/09/2022 1720 Full Code [id number]  Quentin Angst, MD ED   10/03/2022 1656 10/05/2022 1452 Full Code [id number]  Rometta Emery, MD ED   10/01/2022 1011 10/02/2022 1548 Full Code [id number]  Quentin Angst, MD ED   09/27/2022 1211 09/27/2022 2050 Full Code [id number]  Massie Maroon, FNP Inpatient   08/12/2022 0924 08/12/2022 2155 Full Code [id number]  Massie Maroon, FNP Inpatient   02/04/2022 1803 02/05/2022 1906 Full Code [id number]  Massie Maroon, FNP Inpatient   07/31/2021 2115 08/01/2021 1737 Full Code [id number]  Charlsie Quest, MD ED   03/05/2021 1220 03/07/2021 2358 Full Code [id number]  Quentin Angst, MD ED   02/05/2020 0920 02/05/2020 2208 Full Code [id number]  Massie Maroon, FNP Inpatient   10/16/2019 1037 10/16/2019 2223 Full Code [id number]  Massie Maroon, FNP Inpatient   10/16/2019 1035 10/16/2019 1036 Full Code [id number]  Massie Maroon, FNP Inpatient   07/06/2019 1145 07/06/2019 1846 Full Code [id number]  Massie Maroon, FNP Inpatient   01/23/2019 2044 01/25/2019 1651 Full Code [id number]  Marzetta Board, MD Inpatient   07/25/2018 1129 07/25/2018 2008 Full Code [id number]  Massie Maroon, FNP Inpatient   05/18/2018 0901 05/18/2018 1831 Full Code [id number]  Massie Maroon, FNP Inpatient   03/07/2018 1200 03/07/2018 1848 Full Code [id number]  Quentin Angst, MD Inpatient   03/07/2018 1159 03/07/2018 1200 Full Code [id number]  Quentin Angst, MD Inpatient   12/15/2017 1345 12/16/2017 1521 Full Code [id number]  Rometta Emery, MD Inpatient    Questions for Most Recent Historical Code  Status (Order [id number])     Question Answer   By: Consent: discussion documented in EHR            Home/SNF/Other Home  Chief Complaint Sickle cell pain crisis (HCC) [D57.00]  Level of Care/Admitting Diagnosis ED Disposition     ED Disposition  Admit   Condition  --   Comment  Hospital Area: [facility] [[id number]]  Level of Care: Med-Surg [16]  May admit patient to [facility] or [facility] if equivalent level of care is available:: No  Covid Evaluation: Asymptomatic - no recent exposure (last 10 days) testing not required  Diagnosis: Sickle cell pain crisis [facility]) [[id number]]  Admitting Physician: Briscoe Deutscher [[id number]]  Attending Physician: Briscoe Deutscher [[id number]]  Certification:: I certify this patient will need inpatient services for at least 2 midnights  Expected Medical Readiness: 06/30/2023          Medical History Past Medical History:  Diagnosis Date   Acute maxillary sinusitis    Eczema    Sickle cell anemia (HCC) .    Allergies No Known Allergies  IV Location/Drains/Wounds Patient Lines/Drains/Airways Status     Active Line/Drains/Airways     Name Placement date Placement time Site Days   Peripheral IV 06/28/23 22 G Left;Posterior Hand 06/28/23  1901  Hand  less than 1            Labs/Imaging Results for orders placed or performed during the hospital encounter of 06/28/23 (from  the past 48 hour(s))  Comprehensive metabolic panel     Status: Abnormal   Collection Time: 06/28/23  6:05 PM  Result Value Ref Range   Sodium 133 (L) 135 - 145 mmol/L   Potassium 3.5 3.5 - 5.1 mmol/L   Chloride 100 98 - 111 mmol/L   CO2 25 22 - 32 mmol/L   Glucose, Bld 114 (H) 70 - 99 mg/dL    Comment: Glucose reference range applies only to samples taken after fasting for at least 8 hours.   BUN 14 6 - 20 mg/dL   Creatinine, Ser 4.01 0.61 - 1.24 mg/dL   Calcium 9.3 8.9 - 02.7 mg/dL   Total Protein 8.5 (H) 6.5 - 8.1 g/dL   Albumin  5.0 3.5 - 5.0 g/dL   AST 13 (L) 15 - 41 U/L   ALT 24 0 - 44 U/L   Alkaline Phosphatase 56 38 - 126 U/L   Total Bilirubin 2.7 (H) <1.2 mg/dL   GFR, Estimated >25 >36 mL/min    Comment: (NOTE) Calculated using the CKD-EPI Creatinine Equation (2021)    Anion gap 8 5 - 15    Comment: Performed at [facility], [address] [place], [place] [address]  CBC with Differential     Status: Abnormal   Collection Time: 06/28/23  6:05 PM  Result Value Ref Range   WBC 17.0 (H) 4.0 - 10.5 K/uL   RBC 5.65 4.22 - 5.81 MIL/uL   Hemoglobin 15.7 13.0 - 17.0 g/dL   HCT 47.4 25.9 - 56.3 %   MCV 79.5 (L) 80.0 - 100.0 fL   MCH 27.8 26.0 - 34.0 pg   MCHC 35.0 30.0 - 36.0 g/dL   RDW 87.5 64.3 - 32.9 %   Platelets 136 (L) 150 - 400 K/uL   nRBC 0.3 (H) 0.0 - 0.2 %   Neutrophils Relative % 89 %   Neutro Abs 15.0 (H) 1.7 - 7.7 K/uL   Lymphocytes Relative 4 %   Lymphs Abs 0.7 0.7 - 4.0 K/uL   Monocytes Relative 6 %   Monocytes Absolute 1.0 0.1 - 1.0 K/uL   Eosinophils Relative 0 %   Eosinophils Absolute 0.0 0.0 - 0.5 K/uL   Basophils Relative 0 %   Basophils Absolute 0.0 0.0 - 0.1 K/uL   Immature Granulocytes 1 %   Abs Immature Granulocytes 0.23 (H) 0.00 - 0.07 K/uL    Comment: Performed at [facility], [address] [place], [place] [address]  Reticulocytes     Status: Abnormal   Collection Time: 06/28/23  6:05 PM  Result Value Ref Range   Retic Ct Pct 3.9 (H) 0.4 - 3.1 %   RBC. 5.59 4.22 - 5.81 MIL/uL   Retic Count, Absolute 219.7 (H) 19.0 - 186.0 K/uL   Immature Retic Fract 26.2 (H) 2.3 - 15.9 %    Comment: Performed at [facility], [address] [place], [place] [address]   DG Chest Port 1 View  Result Date: 06/28/2023 CLINICAL DATA:  Sickle cell crisis with chest pain and back pain. EXAM: PORTABLE CHEST 1 VIEW COMPARISON:  February 28, 2023 FINDINGS: Limited study secondary to patient rotation. The heart size and mediastinal  contours are within normal limits. Low lung volumes are noted. Both lungs are clear. The visualized skeletal structures are unremarkable. IMPRESSION: Low lung volumes without acute or active cardiopulmonary disease. Electronically Signed   By: Aram Candela M.D.   On: 06/28/2023 20:21  Pending Labs Unresulted Labs (From admission, onward)    None       Vitals/Pain Today's Vitals   06/28/23 2000 06/28/23 2030 06/28/23 2036 06/28/23 2101  BP: (!) 114/96 (!) 109/58    Pulse: 95 100    Resp: 17 16    Temp:  98 F (36.7 C)    TempSrc:      SpO2: 100% 100%    Weight:      Height:      PainSc:   8  7     Isolation Precautions No active isolations  Medications Medications  0.45 % sodium chloride infusion ( Intravenous New Bag/Given 06/28/23 1928)  ondansetron (ZOFRAN) injection 4 mg (4 mg Intravenous Given 06/28/23 1944)  ketorolac (TORADOL) 15 MG/ML injection 15 mg (15 mg Intravenous Given 06/28/23 1928)  HYDROmorphone (DILAUDID) injection 2 mg (2 mg Subcutaneous Given 06/28/23 1929)  HYDROmorphone (DILAUDID) injection 2 mg (2 mg Subcutaneous Given 06/28/23 2007)  HYDROmorphone (DILAUDID) injection 2 mg (2 mg Subcutaneous Given 06/28/23 2041)    Mobility walks

## 2023-06-28 NOTE — ED Provider Notes (Signed)
[facility] AT [facility] Provider Note   CSN: [id number] Arrival date & time: 06/28/23  1650     History  Chief Complaint  Patient presents with   Chest Pain   Back Pain   Sickle Cell Pain Crisis    Austin [name] is a 32 y.o. male.  Patient is a 32 year old male with a past medical history of sickle cell anemia presenting to the emergency department with concern for sickle cell crisis.  He states that he started to have back pain and some mild chest pain while at work today.  He states that he thinks that he overworked himself which caused his pain crisis.  He states he initially had some shortness of breath that is now resolved.  He denies any fever or cough.  States he was been nauseous and vomiting.  Denies any abdominal pain.  Denies any numbness or weakness.  Denies any trauma or falls.  He reports this feels similar to his previous pain crises.  The history is provided by the patient.  Chest Pain Associated symptoms: back pain   Back Pain Associated symptoms: chest pain   Sickle Cell Pain Crisis Associated symptoms: chest pain        Home Medications Prior to Admission medications   Medication Sig Start Date End Date Taking? Authorizing Provider  dicyclomine (BENTYL) 20 MG tablet TAKE 1 TABLET BY MOUTH TWICE A DAY 03/25/22  Yes Jegede, Olugbemiga E, MD  folic acid (FOLVITE) 1 MG tablet Take 1 tablet (1 mg total) by mouth daily. 08/12/22 08/12/23 Yes Massie Maroon, FNP  loratadine (CLARITIN) 10 MG tablet Take 1 tablet (10 mg total) by mouth daily. Patient taking differently: Take 10 mg by mouth daily as needed for allergies or rhinitis. 11/13/20  Yes Barbette Merino, NP  ondansetron (ZOFRAN-ODT) 4 MG disintegrating tablet Dissolve 1 tablet (4 mg total) by mouth every 6 (six) hours as needed for nausea or vomiting. 04/14/23  Yes Massie Maroon, FNP  Oxycodone HCl 10 MG TABS Take 1 tablet (10 mg) by mouth every 6 hours as  needed. Patient taking differently: Take 10 mg by mouth every 6 (six) hours as needed (for pain). 06/25/23  Yes Massie Maroon, FNP  promethazine (PHENERGAN) 25 MG tablet Take 1 tablet (25 mg total) by mouth every 6 (six) hours as needed for nausea or vomiting. 04/15/23  Yes Vanetta Mulders, MD  sildenafil (VIAGRA) 100 MG tablet Take 1 tablet (100 mg) by mouth daily as needed (for E.D.). 05/31/23  Yes Massie Maroon, FNP  bacitracin ointment Apply 1 Application topically 2 (two) times daily. Patient not taking: Reported on 06/28/2023 04/11/23   Henderly, Britni A, PA-C  naproxen (NAPROSYN) 500 MG tablet TAKE 1 TABLET BY MOUTH 2 TIMES DAILY WITH A MEAL. Patient not taking: Reported on 06/28/2023 11/10/22   Ivonne Andrew, NP  ondansetron (ZOFRAN) 4 MG tablet Take 1 tablet (4 mg total) by mouth every 6 (six) hours. Patient not taking: Reported on 06/28/2023 03/01/23   Carroll Sage, PA-C  promethazine (PHENERGAN) 25 MG tablet Take 1 tablet (25 mg total) by mouth every 6 (six) hours as needed for nausea or vomiting. Patient not taking: Reported on 06/28/2023 02/02/23   Derwood Kaplan, MD  triamcinolone (NASACORT) 55 MCG/ACT AERO nasal inhaler Place 2 sprays into the nose daily. 2 sprays each nostril at night Patient not taking: Reported on 06/28/2023 04/27/21   Drema Halon, MD  Vitamin D,  Ergocalciferol, (DRISDOL) 1.25 MG (50000 UNIT) CAPS capsule TAKE 1 CAPSULE (50,000 UNITS TOTAL) BY MOUTH ONCE A WEEK. X 12 WEEKS. Patient not taking: Reported on 06/28/2023 04/13/22   Ivonne Andrew, NP      Allergies    Patient has no known allergies.    Review of Systems   Review of Systems  Cardiovascular:  Positive for chest pain.  Musculoskeletal:  Positive for back pain.    Physical Exam Updated Vital Signs BP (!) 109/58   Pulse 100   Temp 98 F (36.7 C)   Resp 16   Ht 5\' 7"  (1.702 m)   Wt 78 kg   SpO2 100%   BMI 26.93 kg/m  Physical Exam Vitals and nursing note reviewed.   Constitutional:      General: He is not in acute distress.    Appearance: He is well-developed. He is ill-appearing.  HENT:     Head: Normocephalic.  Eyes:     Extraocular Movements: Extraocular movements intact.  Cardiovascular:     Rate and Rhythm: Normal rate and regular rhythm.     Heart sounds: Normal heart sounds.  Pulmonary:     Effort: Pulmonary effort is normal.     Breath sounds: Normal breath sounds.  Chest:     Chest wall: No tenderness.  Abdominal:     Palpations: Abdomen is soft.     Tenderness: There is no abdominal tenderness.  Musculoskeletal:        General: Normal range of motion.     Cervical back: Normal range of motion and neck supple.     Comments: No midline back tenderness, diffuse paraspinal muscle tenderness to palpation  Skin:    General: Skin is warm and dry.  Neurological:     General: No focal deficit present.     Mental Status: He is alert and oriented to person, place, and time.     Motor: No weakness.  Psychiatric:        Mood and Affect: Mood normal.        Behavior: Behavior normal.     ED Results / Procedures / Treatments   Labs (all labs ordered are listed, but only abnormal results are displayed) Labs Reviewed  COMPREHENSIVE METABOLIC PANEL - Abnormal; Notable for the following components:      Result Value   Sodium 133 (*)    Glucose, Bld 114 (*)    Total Protein 8.5 (*)    AST 13 (*)    Total Bilirubin 2.7 (*)    All other components within normal limits  CBC WITH DIFFERENTIAL/PLATELET - Abnormal; Notable for the following components:   WBC 17.0 (*)    MCV 79.5 (*)    Platelets 136 (*)    nRBC 0.3 (*)    Neutro Abs 15.0 (*)    Abs Immature Granulocytes 0.23 (*)    All other components within normal limits  RETICULOCYTES - Abnormal; Notable for the following components:   Retic Ct Pct 3.9 (*)    Retic Count, Absolute 219.7 (*)    Immature Retic Fract 26.2 (*)    All other components within normal limits    EKG EKG  Interpretation Date/Time:  Tuesday June 28 2023 20:18:40 EST Ventricular Rate:  98 PR Interval:  120 QRS Duration:  95 QT Interval:  321 QTC Calculation: 410 R Axis:   74  Text Interpretation: Sinus rhythm Probable inferior infarct, old No significant change since last tracing Confirmed by Elayne Snare ([id number])  on 06/28/2023 8:28:02 PM  Radiology DG Chest Port 1 View  Result Date: 06/28/2023 CLINICAL DATA:  Sickle cell crisis with chest pain and back pain. EXAM: PORTABLE CHEST 1 VIEW COMPARISON:  February 28, 2023 FINDINGS: Limited study secondary to patient rotation. The heart size and mediastinal contours are within normal limits. Low lung volumes are noted. Both lungs are clear. The visualized skeletal structures are unremarkable. IMPRESSION: Low lung volumes without acute or active cardiopulmonary disease. Electronically Signed   By: Aram Candela M.D.   On: 06/28/2023 20:21    Procedures Procedures    Medications Ordered in ED Medications  folic acid (FOLVITE) tablet 1 mg (has no administration in time range)  senna-docusate (Senokot-S) tablet 1 tablet (has no administration in time range)  polyethylene glycol (MIRALAX / GLYCOLAX) packet 17 g (has no administration in time range)  naloxone (NARCAN) injection 0.4 mg (has no administration in time range)    And  sodium chloride flush (NS) 0.9 % injection 9 mL (has no administration in time range)  ondansetron (ZOFRAN) injection 4 mg (has no administration in time range)  diphenhydrAMINE (BENADRYL) capsule 25 mg (has no administration in time range)  enoxaparin (LOVENOX) injection 40 mg (has no administration in time range)  ketorolac (TORADOL) 15 MG/ML injection 15 mg (has no administration in time range)  HYDROmorphone (DILAUDID) 1 mg/mL PCA injection (has no administration in time range)  dicyclomine (BENTYL) tablet 20 mg (has no administration in time range)  ketorolac (TORADOL) 15 MG/ML injection 15 mg (15 mg  Intravenous Given 06/28/23 1928)  HYDROmorphone (DILAUDID) injection 2 mg (2 mg Subcutaneous Given 06/28/23 1929)  HYDROmorphone (DILAUDID) injection 2 mg (2 mg Subcutaneous Given 06/28/23 2007)  HYDROmorphone (DILAUDID) injection 2 mg (2 mg Subcutaneous Given 06/28/23 2041)    ED Course/ Medical Decision Making/ A&P Clinical Course as of 06/28/23 2245  Tue Jun 28, 2023  1926 Labs otherwise within normal range. CXR read and EKG pending. [VK]  2028 No acute disease on CXR, EKG without acute ischemic changes. [VK]  2046 After 2 doses of dilaudid patient reported so significant improvement of pain and thinks he will likely need admission. Will reassess after 3rd dose. [VK]  2133 Patient having continued back pain and will be admitted for sickle cell pain crisis. [VK]    Clinical Course User Index [VK] Rexford Maus, DO                                 Medical Decision Making This patient presents to the ED with chief complaint(s) of SS pain crisis with pertinent past medical history of sickle cell anemia which further complicates the presenting complaint. The complaint involves an extensive differential diagnosis and also carries with it a high risk of complications and morbidity.    The differential diagnosis includes sickle cell pain crisis, chronic pain syndrome, acute chest, pneumonia, pneumothorax, pulmonary edema, pleural effusion, ACS, arrhythmia dehydration, electrolyte abnormality  Additional history obtained: Additional history obtained from N/A Records reviewed previous admission documents and Primary Care Documents  ED Course and Reassessment: On patient's arrival he is hemodynamically stable in no acute distress.  Was initially evaluated in triage and had labs initiated.  Labs showed leukocytosis though may have some hemoconcentration with increased hemoglobin from baseline, appropriate reticulocyte count.  Will be started on fluids and will be given pain and nausea control.   Will of EKG and chest x-ray and will  be closely reassessed.  Independent labs interpretation:  The following labs were independently interpreted: leukocytosis, appropriate reticulocyte count  Independent visualization of imaging: - I independently visualized the following imaging with scope of interpretation limited to determining acute life threatening conditions related to emergency care: CXR, which revealed no acute disease  Consultation: - Consulted or discussed management/test interpretation w/ external professional: hospitalist  Consideration for admission or further workup: patient requires admission for pain control Social Determinants of health: N/A    Amount and/or Complexity of Data Reviewed Labs: ordered. Radiology: ordered.  Risk Prescription drug management. Decision regarding hospitalization.          Final Clinical Impression(s) / ED Diagnoses Final diagnoses:  Sickle cell crisis [facility])    Rx / DC Orders ED Discharge Orders     None         Rexford Maus, DO 06/28/23 2245

## 2023-06-28 NOTE — H&P (Signed)
History and Physical    LORENE [name] XLK:[id number] DOB: 1991-03-04 DOA: 06/28/2023  PCP: Massie Maroon, FNP   Patient coming from: Home   Chief Complaint: Pain   HPI: Austin [name] is a 32 y.o. male with medical history significant for sickle cell disease, chronic pain syndrome, opiate dependence, and eczema who presents with severe pain involving his back and mild chest discomfort.   Patient reports that he was feeling okay earlier in the day, was exerting himself more than usual at work, and went on to develop severe pain in his back consistent with prior sickle cell pain crises, and has also developed some mild chest discomfort.  He denies shortness of breath, fever, chills, or abdominal pain.  ED Course: Upon arrival to the ED, patient is found to be afebrile and saturating mid 90s on room air with normal heart rate and stable blood pressure.  Chest x-ray is negative for acute findings.  Labs are most notable for WBC 17,000, normal hemoglobin, and bilirubin 2.7.   Patient was treated with Toradol, IV Dilaudid x 3, Zofran, and IV fluids.  Review of Systems:  All other systems reviewed and apart from HPI, are negative.  Past Medical History:  Diagnosis Date   Acute maxillary sinusitis    Eczema    Sickle cell anemia (HCC) .    Past Surgical History:  Procedure Laterality Date   FOOT SURGERY Left 12/25/2020   WISDOM TOOTH EXTRACTION      Social History:   reports that he quit smoking about 9 years ago. His smoking use included cigarettes. He has never used smokeless tobacco. He reports current alcohol use of about 1.0 standard drink of alcohol per week. He reports current drug use. Drug: Marijuana.  No Known Allergies  Family History  Problem Relation Age of Onset   Sickle cell trait Mother    Diabetes Father    Hypertension Father      Prior to Admission medications   Medication Sig Start Date End Date Taking? Authorizing Provider  dicyclomine  (BENTYL) 20 MG tablet TAKE 1 TABLET BY MOUTH TWICE A DAY 03/25/22  Yes Jegede, Olugbemiga E, MD  folic acid (FOLVITE) 1 MG tablet Take 1 tablet (1 mg total) by mouth daily. 08/12/22 08/12/23 Yes Massie Maroon, FNP  loratadine (CLARITIN) 10 MG tablet Take 1 tablet (10 mg total) by mouth daily. Patient taking differently: Take 10 mg by mouth daily as needed for allergies or rhinitis. 11/13/20  Yes Barbette Merino, NP  ondansetron (ZOFRAN-ODT) 4 MG disintegrating tablet Dissolve 1 tablet (4 mg total) by mouth every 6 (six) hours as needed for nausea or vomiting. 04/14/23  Yes Massie Maroon, FNP  Oxycodone HCl 10 MG TABS Take 1 tablet (10 mg) by mouth every 6 hours as needed. Patient taking differently: Take 10 mg by mouth every 6 (six) hours as needed (for pain). 06/25/23  Yes Massie Maroon, FNP  promethazine (PHENERGAN) 25 MG tablet Take 1 tablet (25 mg total) by mouth every 6 (six) hours as needed for nausea or vomiting. 04/15/23  Yes Vanetta Mulders, MD  sildenafil (VIAGRA) 100 MG tablet Take 1 tablet (100 mg) by mouth daily as needed (for E.D.). 05/31/23  Yes Massie Maroon, FNP  bacitracin ointment Apply 1 Application topically 2 (two) times daily. Patient not taking: Reported on 06/28/2023 04/11/23   Henderly, Britni A, PA-C  naproxen (NAPROSYN) 500 MG tablet TAKE 1 TABLET BY MOUTH 2 TIMES DAILY WITH A  MEAL. Patient not taking: Reported on 06/28/2023 11/10/22   Ivonne Andrew, NP  ondansetron (ZOFRAN) 4 MG tablet Take 1 tablet (4 mg total) by mouth every 6 (six) hours. Patient not taking: Reported on 06/28/2023 03/01/23   Carroll Sage, PA-C  promethazine (PHENERGAN) 25 MG tablet Take 1 tablet (25 mg total) by mouth every 6 (six) hours as needed for nausea or vomiting. Patient not taking: Reported on 06/28/2023 02/02/23   Derwood Kaplan, MD  triamcinolone (NASACORT) 55 MCG/ACT AERO nasal inhaler Place 2 sprays into the nose daily. 2 sprays each nostril at night Patient not taking:  Reported on 06/28/2023 04/27/21   Drema Halon, MD  Vitamin D, Ergocalciferol, (DRISDOL) 1.25 MG (50000 UNIT) CAPS capsule TAKE 1 CAPSULE (50,000 UNITS TOTAL) BY MOUTH ONCE A WEEK. X 12 WEEKS. Patient not taking: Reported on 06/28/2023 04/13/22   Ivonne Andrew, NP    Physical Exam: Vitals:   06/28/23 1729 06/28/23 1930 06/28/23 2000 06/28/23 2030  BP:  135/80 (!) 114/96 (!) 109/58  Pulse:  100 95 100  Resp:  14 17 16   Temp:    98 F (36.7 C)  TempSrc:      SpO2:  94% 100% 100%  Weight: 78 kg     Height: 5\' 7"  (1.702 m)       Constitutional: NAD, appears uncomfortable   Eyes: PERTLA, lids and conjunctivae normal ENMT: Mucous membranes are moist. Posterior pharynx clear of any exudate or lesions.   Neck: supple, no masses  Respiratory: no wheezing, no crackles. No accessory muscle use.  Cardiovascular: S1 & S2 heard, regular rate and rhythm. No extremity edema.  Abdomen: No distension, no tenderness, soft. Bowel sounds active.  Musculoskeletal: no clubbing / cyanosis. No joint deformity upper and lower extremities.   Skin: no significant rashes, lesions, ulcers. Warm, dry, well-perfused. Neurologic: CN 2-12 grossly intact. Moving all extremities. Alert and oriented.  Psychiatric: Pleasant. Cooperative.    Labs and Imaging on Admission: I have personally reviewed following labs and imaging studies  CBC: Recent Labs  Lab 06/28/23 1805  WBC 17.0*  NEUTROABS 15.0*  HGB 15.7  HCT 44.9  MCV 79.5*  PLT 136*   Basic Metabolic Panel: Recent Labs  Lab 06/28/23 1805  NA 133*  K 3.5  CL 100  CO2 25  GLUCOSE 114*  BUN 14  CREATININE 0.95  CALCIUM 9.3   GFR: Estimated Creatinine Clearance: 103.4 mL/min (by C-G formula based on SCr of 0.95 mg/dL). Liver Function Tests: Recent Labs  Lab 06/28/23 1805  AST 13*  ALT 24  ALKPHOS 56  BILITOT 2.7*  PROT 8.5*  ALBUMIN 5.0   No results for input(s): "LIPASE", "AMYLASE" in the last 168 hours. No results for  input(s): "AMMONIA" in the last 168 hours. Coagulation Profile: No results for input(s): "INR", "PROTIME" in the last 168 hours. Cardiac Enzymes: No results for input(s): "CKTOTAL", "CKMB", "CKMBINDEX", "TROPONINI" in the last 168 hours. BNP (last 3 results) No results for input(s): "PROBNP" in the last 8760 hours. HbA1C: No results for input(s): "HGBA1C" in the last 72 hours. CBG: No results for input(s): "GLUCAP" in the last 168 hours. Lipid Profile: No results for input(s): "CHOL", "HDL", "LDLCALC", "TRIG", "CHOLHDL", "LDLDIRECT" in the last 72 hours. Thyroid Function Tests: No results for input(s): "TSH", "T4TOTAL", "FREET4", "T3FREE", "THYROIDAB" in the last 72 hours. Anemia Panel: Recent Labs    06/28/23 1805  RETICCTPCT 3.9*   Urine analysis:    Component Value Date/Time  COLORURINE YELLOW 10/14/2021 1232   APPEARANCEUR CLEAR 10/14/2021 1232   LABSPEC 1.042 (H) 10/14/2021 1232   PHURINE 7.0 10/14/2021 1232   GLUCOSEU NEGATIVE 10/14/2021 1232   HGBUR NEGATIVE 10/14/2021 1232   BILIRUBINUR NEGATIVE 10/14/2021 1232   BILIRUBINUR negative 08/21/2021 1527   BILIRUBINUR negative 02/05/2020 0749   KETONESUR NEGATIVE 10/14/2021 1232   PROTEINUR NEGATIVE 10/14/2021 1232   UROBILINOGEN 2.0 (A) 08/21/2021 1527   UROBILINOGEN 1.0 10/18/2017 0841   NITRITE NEGATIVE 10/14/2021 1232   LEUKOCYTESUR NEGATIVE 10/14/2021 1232   Sepsis Labs: @LABRCNTIP (procalcitonin:4,lacticidven:4) )No results found for this or any previous visit (from the past 240 hour(s)).   Radiological Exams on Admission: DG Chest Port 1 View  Result Date: 06/28/2023 CLINICAL DATA:  Sickle cell crisis with chest pain and back pain. EXAM: PORTABLE CHEST 1 VIEW COMPARISON:  February 28, 2023 FINDINGS: Limited study secondary to patient rotation. The heart size and mediastinal contours are within normal limits. Low lung volumes are noted. Both lungs are clear. The visualized skeletal structures are unremarkable.  IMPRESSION: Low lung volumes without acute or active cardiopulmonary disease. Electronically Signed   By: Aram Candela M.D.   On: 06/28/2023 20:21    EKG: Independently reviewed. Sinus rhythm.   Assessment/Plan  1. Sickle cell pain crisis  - Schedule Toradol, start Dilaudid PCA with close monitoring  2. Leukocytosis  - No fever or apparent infectious process, likely reactive     DVT prophylaxis: Lovenox  Code Status: Full  Level of Care: Level of care: Med-Surg Family Communication: None present  Disposition Plan:  Patient is from: home  Anticipated d/c is to: Home  Anticipated d/c date is: 07/01/23  Patient currently: Pending pain-control  Consults called: None  Admission status: Inpatient     Briscoe Deutscher, MD [facility]  06/28/2023, 10:37 PM

## 2023-06-29 MED ORDER — SODIUM CHLORIDE 0.9 % IV SOLN
12.5000 mg | Freq: Four times a day (QID) | INTRAVENOUS | Status: DC | PRN
  Administered 2023-06-29: 12.5 mg via INTRAVENOUS
  Filled 2023-06-29: qty 12.5

## 2023-06-29 MED ORDER — SODIUM CHLORIDE 0.9 % IV SOLN
12.5000 mg | Freq: Once | INTRAVENOUS | Status: AC
  Administered 2023-06-29: 12.5 mg via INTRAVENOUS
  Filled 2023-06-29: qty 12.5

## 2023-06-29 NOTE — Progress Notes (Signed)
   06/29/23 1246  TOC Brief Assessment  Insurance and Status Reviewed  Patient has primary care physician Yes  Home environment has been reviewed Apartment  Prior level of function: Independent  Prior/Current Home Services No current home services  Social Determinants of Health Reivew SDOH reviewed no interventions necessary  Readmission risk has been reviewed Yes  Transition of care needs no transition of care needs at this time

## 2023-06-29 NOTE — Plan of Care (Signed)

## 2023-06-29 NOTE — Progress Notes (Signed)
Subjective: Austin [name] is a 32 year old male with a medical history significant for sickle cell disease, chronic pain syndrome, opiate dependence and tolerance, and anemia of chronic disease that was admitted for sickle cell pain crisis. Today, Austin [name] is complaining of pain primarily to his low back and lower extremities.  He rates pain as 8/10.  He states that he has had some improvement overnight.  He denies any fever, chills, chest pain, or shortness of breath.  Patient endorses nausea.  He denies any vomiting or diarrhea.  Objective:  Vital signs in last 24 hours:  Vitals:   06/29/23 0440 06/29/23 0447 06/29/23 0751 06/29/23 0837  BP: 122/74  118/69   Pulse: 94  78   Resp: 16 17 16 14   Temp: 99 F (37.2 C)  98.3 F (36.8 C)   TempSrc: Oral  Oral   SpO2: 99% 96% 97% 98%  Weight:      Height:        Intake/Output from previous day:   Intake/Output Summary (Last 24 hours) at 06/29/2023 1141 Last data filed at 06/29/2023 0600 Gross per 24 hour  Intake 200 ml  Output 250 ml  Net -50 ml    Physical Exam: General: Alert, awake, oriented x3, in no acute distress.  HEENT: [place]/AT PEERL, EOMI Neck: Trachea midline,  no masses, no thyromegal,y no JVD, no carotid bruit OROPHARYNX:  Moist, No exudate/ erythema/lesions.  Heart: Regular rate and rhythm, without murmurs, rubs, gallops, PMI non-displaced, no heaves or thrills on palpation.  Lungs: Clear to auscultation, no wheezing or rhonchi noted. No increased vocal fremitus resonant to percussion  Abdomen: Soft, nontender, nondistended, positive bowel sounds, no masses no hepatosplenomegaly noted..  Neuro: No focal neurological deficits noted cranial nerves II through XII grossly intact. DTRs 2+ bilaterally upper and lower extremities. Strength 5 out of 5 in bilateral upper and lower extremities. Musculoskeletal: No warm swelling or erythema around joints, no spinal tenderness noted. Psychiatric: Patient alert and oriented  x3, good insight and cognition, good recent to remote recall. Lymph node survey: No cervical axillary or inguinal lymphadenopathy noted.  Lab Results:  Basic Metabolic Panel:    Component Value Date/Time   NA 133 (L) 06/28/2023 1805   NA 141 07/05/2022 0946   K 3.5 06/28/2023 1805   CL 100 06/28/2023 1805   CO2 25 06/28/2023 1805   BUN 14 06/28/2023 1805   BUN 9 07/05/2022 0946   CREATININE 0.95 06/28/2023 1805   GLUCOSE 114 (H) 06/28/2023 1805   CALCIUM 9.3 06/28/2023 1805   CBC:    Component Value Date/Time   WBC 17.0 (H) 06/28/2023 1805   HGB 15.7 06/28/2023 1805   HGB 13.0 07/05/2022 0946   HCT 44.9 06/28/2023 1805   HCT 39.2 07/05/2022 0946   PLT 136 (L) 06/28/2023 1805   PLT 148 (L) 07/05/2022 0946   MCV 79.5 (L) 06/28/2023 1805   MCV 82 07/05/2022 0946   NEUTROABS 15.0 (H) 06/28/2023 1805   NEUTROABS 6.1 07/05/2022 0946   LYMPHSABS 0.7 06/28/2023 1805   LYMPHSABS 1.9 07/05/2022 0946   MONOABS 1.0 06/28/2023 1805   EOSABS 0.0 06/28/2023 1805   EOSABS 0.1 07/05/2022 0946   BASOSABS 0.0 06/28/2023 1805   BASOSABS 0.0 07/05/2022 0946    No results found for this or any previous visit (from the past 240 hour(s)).  Studies/Results: DG Chest Port 1 View  Result Date: 06/28/2023 CLINICAL DATA:  Sickle cell crisis with chest pain and back pain. EXAM: PORTABLE CHEST  1 VIEW COMPARISON:  February 28, 2023 FINDINGS: Limited study secondary to patient rotation. The heart size and mediastinal contours are within normal limits. Low lung volumes are noted. Both lungs are clear. The visualized skeletal structures are unremarkable. IMPRESSION: Low lung volumes without acute or active cardiopulmonary disease. Electronically Signed   By: Aram Candela M.D.   On: 06/28/2023 20:21    Medications: Scheduled Meds:  dicyclomine  20 mg Oral BID   enoxaparin (LOVENOX) injection  40 mg Subcutaneous Q24H   folic acid  1 mg Oral Daily   HYDROmorphone   Intravenous Q4H   ketorolac  15  mg Intravenous Q6H   senna-docusate  1 tablet Oral BID   Continuous Infusions: PRN Meds:.diphenhydrAMINE, naloxone **AND** sodium chloride flush, ondansetron (ZOFRAN) IV, polyethylene glycol  Consultants: none  Procedures: none  Antibiotics: none  Assessment/Plan: Principal Problem:   Sickle cell pain crisis (HCC) Active Problems:   Leukocytosis  Sickle cell disease with pain crisis: Continue IV Dilaudid PCA Toradol 15 mg IV every 6 hours Monitor vital signs very closely, reevaluate pain scale regularly, and supplemental oxygen as needed  Chronic pain syndrome: Continue home medications  Leukocytosis: WBCs slightly elevated.  More than likely reactive secondary to sickle cell disease.  Continue to monitor closely without antibiotic.  No signs of acute infection. Labs in AM.  Anemia of chronic disease: Patient's hemoglobin is stable and consistent with baseline.  There is no clinical indication for blood transfusion at this time.  Monitor closely. Code Status: Full Code Family Communication: N/A Disposition Plan: Not yet ready for discharge  Faruq Rosenberger Rennis Petty  APRN, MSN, FNP-C [facility] [address] [place], [place] [address] [phone]  If 7PM-7AM, please contact night-coverage.  06/29/2023, 11:41 AM  LOS: 1 day

## 2023-06-30 ENCOUNTER — Encounter: Payer: Self-pay | Admitting: Family Medicine

## 2023-06-30 NOTE — Discharge Summary (Signed)
Physician Discharge Summary  CODEN Quail EPP:[id number] DOB: Dec 01, 1990 DOA: 06/28/2023  PCP: Massie Maroon, FNP  Admit date: 06/28/2023  Discharge date: 06/30/2023  Discharge Diagnoses:  Principal Problem:   Sickle cell pain crisis (HCC) Active Problems:   Leukocytosis   Discharge Condition: Stable  Disposition:  Pt is discharged home in good condition and is to follow up with Massie Maroon, FNP this week to have labs evaluated. Nilson Goatley [name] is instructed to increase activity slowly and balance with rest for the next few days, and use prescribed medication to complete treatment of pain  Diet: Regular Wt Readings from Last 3 Encounters:  06/28/23 78 kg  05/26/23 78 kg  05/20/23 74.8 kg    History of present illness:  Austin [name] is a 32 year old male with a medical history significant for sickle cell disease, dependence, and eczema who presents with severe pain involving his back and mid chest discomfort. Patient reports that he was feeling okay earlier in the day, was exerting himself more than usual at work, and went on to develop severe pain in his back that is consistent with prior sickle cell pain crisis, and also has developed some mild chest discomfort.  He denies shortness of breath, fever, chills, or abdominal pain.  ED course: Upon arrival to the emergency department, patient is found to be afebrile and saturating to mid 90s on room air with normal heart rate and stable blood pressure.  Chest x-ray is negative for any acute findings.  Labs are notable for WBCs 1700, normal hemoglobin, and bilirubin 2.7. Patient was treated with Toradol, IV Dilaudid x 3, Zofran, and IV fluids.  He was admitted for further management of his sickle cell pain crisis.  Hospital Course:  Sickle cell disease with pain crisis: Patient was admitted for sickle cell pain crisis and managed appropriately with IVF, IV Dilaudid via PCA and IV Toradol, as well as other adjunct  therapies per sickle cell pain management protocols.  Patient was therefore discharged home today in a hemodynamically stable condition.   Eyasu will follow-up with PCP within 1 week of this discharge. Jonavan was counseled extensively about nonpharmacologic means of pain management, patient verbalized understanding and was appreciative of  the care received during this admission.   We discussed the need for good hydration, monitoring of hydration status, avoidance of heat, cold, stress, and infection triggers.  Patient was reminded of the need to seek medical attention immediately if any symptom of bleeding, anemia, or infection occurs.  Discharge Exam: Vitals:   06/30/23 1217 06/30/23 1238  BP: (!) 142/101   Pulse: 100   Resp: 17 15  Temp: 98.1 F (36.7 C)   SpO2: 100% 100%   Vitals:   06/30/23 0834 06/30/23 1000 06/30/23 1217 06/30/23 1238  BP: 127/62  (!) 142/101   Pulse: 85  100   Resp: 16 16 17 15   Temp: 98.5 F (36.9 C)  98.1 F (36.7 C)   TempSrc: Oral  Oral   SpO2: 98% 99% 100% 100%  Weight:      Height:        General appearance : Awake, alert, not in any distress. Speech Clear. Not toxic looking HEENT: Atraumatic and Normocephalic, pupils equally reactive to light and accomodation Neck: Supple, no JVD. No cervical lymphadenopathy.  Chest: Good air entry bilaterally, no added sounds  CVS: S1 S2 regular, no murmurs.  Abdomen: Bowel sounds present, Non tender and not distended with no gaurding, rigidity or rebound. Extremities:  B/L Lower Ext shows no edema, both legs are warm to touch Neurology: Awake alert, and oriented X 3, CN II-XII intact, Non focal Skin: No Rash  Discharge Instructions  Discharge Instructions     Discharge patient   Complete by: As directed    Discharge disposition: 01-Home or Self Care   Discharge patient date: 06/30/2023      Allergies as of 06/30/2023   No Known Allergies      Medication List     TAKE these medications     bacitracin ointment Apply 1 Application topically 2 (two) times daily.   dicyclomine 20 MG tablet Commonly known as: BENTYL TAKE 1 TABLET BY MOUTH TWICE A DAY   folic acid 1 MG tablet Commonly known as: FOLVITE Take 1 tablet (1 mg total) by mouth daily.   loratadine 10 MG tablet Commonly known as: CLARITIN Take 1 tablet (10 mg total) by mouth daily. What changed:  when to take this reasons to take this   naproxen 500 MG tablet Commonly known as: NAPROSYN TAKE 1 TABLET BY MOUTH 2 TIMES DAILY WITH A MEAL.   ondansetron 4 MG disintegrating tablet Commonly known as: ZOFRAN-ODT Dissolve 1 tablet (4 mg total) by mouth every 6 (six) hours as needed for nausea or vomiting.   ondansetron 4 MG tablet Commonly known as: ZOFRAN Take 1 tablet (4 mg total) by mouth every 6 (six) hours.   Oxycodone HCl 10 MG Tabs Take 1 tablet (10 mg) by mouth every 6 hours as needed. What changed: reasons to take this   promethazine 25 MG tablet Commonly known as: PHENERGAN Take 1 tablet (25 mg total) by mouth every 6 (six) hours as needed for nausea or vomiting.   promethazine 25 MG tablet Commonly known as: PHENERGAN Take 1 tablet (25 mg total) by mouth every 6 (six) hours as needed for nausea or vomiting.   sildenafil 100 MG tablet Commonly known as: VIAGRA Take 1 tablet (100 mg) by mouth daily as needed (for E.D.).   triamcinolone 55 MCG/ACT Aero nasal inhaler Commonly known as: NASACORT Place 2 sprays into the nose daily. 2 sprays each nostril at night   Vitamin D (Ergocalciferol) 1.25 MG (50000 UNIT) Caps capsule Commonly known as: DRISDOL TAKE 1 CAPSULE (50,000 UNITS TOTAL) BY MOUTH ONCE A WEEK. X 12 WEEKS.        The results of significant diagnostics from this hospitalization (including imaging, microbiology, ancillary and laboratory) are listed below for reference.    Significant Diagnostic Studies: DG Chest Port 1 View  Result Date: 06/28/2023 CLINICAL DATA:  Sickle  cell crisis with chest pain and back pain. EXAM: PORTABLE CHEST 1 VIEW COMPARISON:  February 28, 2023 FINDINGS: Limited study secondary to patient rotation. The heart size and mediastinal contours are within normal limits. Low lung volumes are noted. Both lungs are clear. The visualized skeletal structures are unremarkable. IMPRESSION: Low lung volumes without acute or active cardiopulmonary disease. Electronically Signed   By: Aram Candela M.D.   On: 06/28/2023 20:21    Microbiology: No results found for this or any previous visit (from the past 240 hour(s)).   Labs: Basic Metabolic Panel: Recent Labs  Lab 06/28/23 1805  NA 133*  K 3.5  CL 100  CO2 25  GLUCOSE 114*  BUN 14  CREATININE 0.95  CALCIUM 9.3   Liver Function Tests: Recent Labs  Lab 06/28/23 1805  AST 13*  ALT 24  ALKPHOS 56  BILITOT 2.7*  PROT 8.5*  ALBUMIN 5.0   No results for input(s): "LIPASE", "AMYLASE" in the last 168 hours. No results for input(s): "AMMONIA" in the last 168 hours. CBC: Recent Labs  Lab 06/28/23 1805  WBC 17.0*  NEUTROABS 15.0*  HGB 15.7  HCT 44.9  MCV 79.5*  PLT 136*   Cardiac Enzymes: No results for input(s): "CKTOTAL", "CKMB", "CKMBINDEX", "TROPONINI" in the last 168 hours. BNP: Invalid input(s): "POCBNP" CBG: No results for input(s): "GLUCAP" in the last 168 hours.  Time coordinating discharge: 30 minutes  Signed:  Nolon Nations  APRN, MSN, FNP-C Patient Care [facility] [address] [place], [place] [address] [phone]  [facility] 06/30/2023, 12:53 PM

## 2023-07-06 ENCOUNTER — Other Ambulatory Visit: Payer: Self-pay | Admitting: Family Medicine

## 2023-07-06 DIAGNOSIS — D571 Sickle-cell disease without crisis: Secondary | ICD-10-CM

## 2023-07-06 NOTE — Telephone Encounter (Signed)
Please advise KH 

## 2023-07-07 ENCOUNTER — Other Ambulatory Visit: Payer: Self-pay | Admitting: Family Medicine

## 2023-07-07 ENCOUNTER — Other Ambulatory Visit (HOSPITAL_COMMUNITY): Payer: Self-pay

## 2023-07-07 DIAGNOSIS — D571 Sickle-cell disease without crisis: Secondary | ICD-10-CM

## 2023-07-07 MED ORDER — OXYCODONE HCL 10 MG PO TABS
10.0000 mg | ORAL_TABLET | Freq: Four times a day (QID) | ORAL | 0 refills | Status: DC | PRN
  Filled 2023-07-09: qty 60, 15d supply, fill #0

## 2023-07-07 NOTE — Progress Notes (Signed)
Reviewed PDMP substance reporting system prior to prescribing opiate medications. No inconsistencies noted.  Current Outpatient Medications on File Prior to Visit  Medication Sig Dispense Refill   bacitracin ointment Apply 1 Application topically 2 (two) times daily. (Patient not taking: Reported on 06/28/2023) 120 g 0   dicyclomine (BENTYL) 20 MG tablet TAKE 1 TABLET BY MOUTH TWICE A DAY 60 tablet 11   folic acid (FOLVITE) 1 MG tablet Take 1 tablet (1 mg total) by mouth daily. 60 tablet 3   loratadine (CLARITIN) 10 MG tablet Take 1 tablet (10 mg total) by mouth daily. (Patient taking differently: Take 10 mg by mouth daily as needed for allergies or rhinitis.) 30 tablet 6   naproxen (NAPROSYN) 500 MG tablet TAKE 1 TABLET BY MOUTH 2 TIMES DAILY WITH A MEAL. (Patient not taking: Reported on 06/28/2023) 60 tablet 2   ondansetron (ZOFRAN) 4 MG tablet Take 1 tablet (4 mg total) by mouth every 6 (six) hours. (Patient not taking: Reported on 06/28/2023) 12 tablet 0   ondansetron (ZOFRAN-ODT) 4 MG disintegrating tablet Dissolve 1 tablet (4 mg total) by mouth every 6 (six) hours as needed for nausea or vomiting. 30 tablet 3   promethazine (PHENERGAN) 25 MG tablet Take 1 tablet (25 mg total) by mouth every 6 (six) hours as needed for nausea or vomiting. (Patient not taking: Reported on 06/28/2023) 12 tablet 0   promethazine (PHENERGAN) 25 MG tablet Take 1 tablet (25 mg total) by mouth every 6 (six) hours as needed for nausea or vomiting. 20 tablet 0   sildenafil (VIAGRA) 100 MG tablet Take 1 tablet (100 mg) by mouth daily as needed (for E.D.). 10 tablet 0   triamcinolone (NASACORT) 55 MCG/ACT AERO nasal inhaler Place 2 sprays into the nose daily. 2 sprays each nostril at night (Patient not taking: Reported on 06/28/2023) 1 each 4   Vitamin D, Ergocalciferol, (DRISDOL) 1.25 MG (50000 UNIT) CAPS capsule TAKE 1 CAPSULE (50,000 UNITS TOTAL) BY MOUTH ONCE A WEEK. X 12 WEEKS. (Patient not taking: Reported on 06/28/2023) 12  capsule 3   No current facility-administered medications on file prior to visit.      Nolon Nations  APRN, MSN, FNP-C [facility] [address] [place], [place] [address] [phone]

## 2023-07-07 NOTE — Telephone Encounter (Signed)
Please advise KH 

## 2023-07-09 ENCOUNTER — Other Ambulatory Visit (HOSPITAL_COMMUNITY): Payer: Self-pay

## 2023-07-11 ENCOUNTER — Other Ambulatory Visit (HOSPITAL_COMMUNITY): Payer: Self-pay

## 2023-07-23 ENCOUNTER — Ambulatory Visit
Admission: EM | Admit: 2023-07-23 | Discharge: 2023-07-23 | Disposition: A | Discharge status: Home / Self Care | Payer: PRIVATE HEALTH INSURANCE | Attending: Physician Assistant | Admitting: Physician Assistant

## 2023-07-23 ENCOUNTER — Encounter: Payer: Self-pay | Admitting: *Deleted

## 2023-07-23 DIAGNOSIS — R220 Localized swelling, mass and lump, head: Secondary | ICD-10-CM | POA: Diagnosis not present

## 2023-07-23 DIAGNOSIS — K047 Periapical abscess without sinus: Secondary | ICD-10-CM | POA: Diagnosis not present

## 2023-07-23 MED ORDER — AMOXICILLIN-POT CLAVULANATE 875-125 MG PO TABS: 1.0000 | ORAL_TABLET | Freq: Two times a day (BID) | ORAL | 0 refills | Status: DC

## 2023-07-23 NOTE — Discharge Instructions (Signed)
Start Augmentin twice daily for 10 days.  Use ibuprofen and Tylenol over-the-counter for pain.  Gargle with warm salt water for additional symptom relief.  You should follow-up with dentist; call to schedule an appointment.  If you develop any worsening symptoms including difficulty swallowing, difficulty speaking, swelling of your throat, high fever, change in your voice you need to be seen immediately.

## 2023-07-23 NOTE — ED Provider Notes (Signed)
[facility]    CSN: [id number] Arrival date & time: 07/23/23  1146      History   Chief Complaint Chief Complaint  Patient presents with   Dental Pain    HPI Austin [name] is a 32 y.o. male.   Patient presents today with a 2 to 3-day history of worsening left lower jaw pain.  He reports pain is rated 8 on a 0-10 pain scale, described as throbbing, worse with mastication, no alleviating factors identified.  He has a broken tooth in this area but it has been broken for several years and only recently became painful.  He has not seen a dentist recently denies any recent dental procedures.  He has been taking Aleve without improvement.  Denies any fever, nausea, vomiting.  Denies any swelling of his throat, shortness of breath, muffled voice.  He denies any recent antibiotics.    Past Medical History:  Diagnosis Date   Acute maxillary sinusitis    Eczema    Sickle cell anemia (HCC) .    Patient Active Problem List   Diagnosis Date Noted   Leukocytosis 06/28/2023   Sickle cell pain crisis (HCC) 06/07/2023   Sickle cell crisis (HCC) 10/14/2022   Sickle cell anemia with pain (HCC) 10/08/2022   Hypokalemia 03/05/2021   Anemia of chronic disease 03/05/2021   Vitamin D deficiency 11/16/2020   Bilateral lower extremity pain 10/17/2019   Chronic pain syndrome 09/20/2019   Nausea 09/20/2019   Chronic, continuous use of opioids 09/20/2019   Medication management 03/20/2019   Bilious vomiting without nausea    Viral gastroenteritis 01/23/2019   Generalized abdominal pain    Dehydration    Acute sickle cell crisis (HCC) 12/15/2017   Thrombocytopenia (HCC) 12/15/2017   Splenomegaly 12/15/2017   Sickle cell anemia with crisis (HCC) 12/15/2017    Past Surgical History:  Procedure Laterality Date   FOOT SURGERY Left 12/25/2020   WISDOM TOOTH EXTRACTION         Home Medications    Prior to Admission medications   Medication Sig Start Date End Date  Taking? Authorizing Provider  amoxicillin-clavulanate (AUGMENTIN) 875-125 MG tablet Take 1 tablet by mouth every 12 (twelve) hours. 07/23/23  Yes Maryna Yeagle K, PA-C  Oxycodone HCl 10 MG TABS Take 1 tablet (10 mg) by mouth every 6 hours as needed. 07/09/23  Yes Massie Maroon, FNP  bacitracin ointment Apply 1 Application topically 2 (two) times daily. Patient not taking: Reported on 06/28/2023 04/11/23   Henderly, Britni A, PA-C  dicyclomine (BENTYL) 20 MG tablet TAKE 1 TABLET BY MOUTH TWICE A DAY 03/25/22   Quentin Angst, MD  folic acid (FOLVITE) 1 MG tablet Take 1 tablet (1 mg total) by mouth daily. 08/12/22 08/12/23  Massie Maroon, FNP  loratadine (CLARITIN) 10 MG tablet Take 1 tablet (10 mg total) by mouth daily. Patient taking differently: Take 10 mg by mouth daily as needed for allergies or rhinitis. 11/13/20   Barbette Merino, NP  naproxen (NAPROSYN) 500 MG tablet TAKE 1 TABLET BY MOUTH 2 TIMES DAILY WITH A MEAL. Patient not taking: Reported on 06/28/2023 11/10/22   Ivonne Andrew, NP  ondansetron (ZOFRAN) 4 MG tablet Take 1 tablet (4 mg total) by mouth every 6 (six) hours. Patient not taking: Reported on 06/28/2023 03/01/23   Carroll Sage, PA-C  ondansetron (ZOFRAN-ODT) 4 MG disintegrating tablet Dissolve 1 tablet (4 mg total) by mouth every 6 (six) hours as needed for nausea  or vomiting. 04/14/23   Massie Maroon, FNP  promethazine (PHENERGAN) 25 MG tablet Take 1 tablet (25 mg total) by mouth every 6 (six) hours as needed for nausea or vomiting. Patient not taking: Reported on 06/28/2023 02/02/23   Derwood Kaplan, MD  promethazine (PHENERGAN) 25 MG tablet Take 1 tablet (25 mg total) by mouth every 6 (six) hours as needed for nausea or vomiting. 04/15/23   Vanetta Mulders, MD  sildenafil (VIAGRA) 100 MG tablet Take 1 tablet (100 mg) by mouth daily as needed (for E.D.). 05/31/23   Massie Maroon, FNP  triamcinolone (NASACORT) 55 MCG/ACT AERO nasal inhaler Place 2 sprays into  the nose daily. 2 sprays each nostril at night Patient not taking: Reported on 06/28/2023 04/27/21   Drema Halon, MD  Vitamin D, Ergocalciferol, (DRISDOL) 1.25 MG (50000 UNIT) CAPS capsule TAKE 1 CAPSULE (50,000 UNITS TOTAL) BY MOUTH ONCE A WEEK. X 12 WEEKS. Patient not taking: Reported on 06/28/2023 04/13/22   Ivonne Andrew, NP    Family History Family History  Problem Relation Age of Onset   Sickle cell trait Mother    Diabetes Father    Hypertension Father     Social History Social History   Tobacco Use   Smoking status: Former    Current packs/day: 0.00    Types: Cigarettes    Quit date: 11/23/2013    Years since quitting: 9.6   Smokeless tobacco: Never  Vaping Use   Vaping status: Never Used  Substance Use Topics   Alcohol use: Not Currently    Alcohol/week: 1.0 standard drink of alcohol    Types: 1 Standard drinks or equivalent per week   Drug use: Yes    Types: Marijuana    Comment: he states "I smoke weed every day"     Allergies   Patient has no known allergies.   Review of Systems Review of Systems  Constitutional:  Positive for activity change. Negative for appetite change, fatigue and fever.  HENT:  Positive for dental problem and facial swelling. Negative for congestion, sore throat, trouble swallowing and voice change.   Respiratory:  Negative for shortness of breath.   Cardiovascular:  Negative for chest pain.  Gastrointestinal:  Negative for abdominal pain, diarrhea, nausea and vomiting.     Physical Exam Triage Vital Signs ED Triage Vitals  Encounter Vitals Group     BP 07/23/23 1204 118/79     Systolic BP Percentile --      Diastolic BP Percentile --      Pulse Rate 07/23/23 1204 77     Resp 07/23/23 1204 16     Temp 07/23/23 1204 98.5 F (36.9 C)     Temp Source 07/23/23 1204 Oral     SpO2 07/23/23 1204 97 %     Weight --      Height --      Head Circumference --      Peak Flow --      Pain Score 07/23/23 1205 8     Pain  Loc --      Pain Education --      Exclude from Growth Chart --    No data found.  Updated Vital Signs BP 118/79 (BP Location: Left Arm)   Pulse 77   Temp 98.5 F (36.9 C) (Oral)   Resp 16   SpO2 97%   Visual Acuity Right Eye Distance:   Left Eye Distance:   Bilateral Distance:    Right Eye  Near:   Left Eye Near:    Bilateral Near:     Physical Exam Vitals reviewed.  Constitutional:      General: He is awake.     Appearance: Normal appearance. He is well-developed. He is not ill-appearing.     Comments: Very pleasant male appears stated age in no acute distress sitting comfortably in exam room  HENT:     Head: Normocephalic and atraumatic.     Jaw: Swelling present.      Mouth/Throat:     Dentition: Abnormal dentition. Gingival swelling and dental abscesses present. No dental tenderness.     Pharynx: Uvula midline. No oropharyngeal exudate or posterior oropharyngeal erythema.      Comments: Broken tooth #19 with surrounding erythema and swelling.  No evidence of Ludwig angina on exam. Cardiovascular:     Rate and Rhythm: Normal rate and regular rhythm.     Heart sounds: Normal heart sounds, S1 normal and S2 normal. No murmur heard. Pulmonary:     Effort: Pulmonary effort is normal.     Breath sounds: Normal breath sounds. No stridor. No wheezing, rhonchi or rales.     Comments: Clear to auscultation bilaterally Neurological:     Mental Status: He is alert.  Psychiatric:        Behavior: Behavior is cooperative.      UC Treatments / Results  Labs (all labs ordered are listed, but only abnormal results are displayed) Labs Reviewed - No data to display  EKG   Radiology No results found.  Procedures Procedures (including critical care time)  Medications Ordered in UC Medications - No data to display  Initial Impression / Assessment and Plan / UC Course  I have reviewed the triage vital signs and the nursing notes.  Pertinent labs & imaging results  that were available during my care of the patient were reviewed by me and considered in my medical decision making (see chart for details).     Patient is well-appearing, afebrile, nontoxic, nontachycardic.  No indication for emergent evaluation or imaging.  Patient was treated for dental infection with Augmentin twice daily for 10 days.  Recommended he use over-the-counter Tylenol and ibuprofen for pain relief.  Recommend he gargle with warm salt water for additional symptom relief.  Discussed that ultimately he will need to see dentist to address underlying tooth.  He was provided low-cost dental resources in the area with after visit summary.  If he develops any worsening symptoms including fever, nausea, vomiting, swelling of his throat, muffled voice, dysphagia he needs to go to the emergency room to which he expressed understanding.  Work excuse note provided.   Final Clinical Impressions(s) / UC Diagnoses   Final diagnoses:  Dental infection  Facial swelling     Discharge Instructions      Start Augmentin twice daily for 10 days.  Use ibuprofen and Tylenol over-the-counter for pain.  Gargle with warm salt water for additional symptom relief.  You should follow-up with dentist; call to schedule an appointment.  If you develop any worsening symptoms including difficulty swallowing, difficulty speaking, swelling of your throat, high fever, change in your voice you need to be seen immediately.      ED Prescriptions     Medication Sig Dispense Auth. Provider   amoxicillin-clavulanate (AUGMENTIN) 875-125 MG tablet Take 1 tablet by mouth every 12 (twelve) hours. 20 tablet Naria Abbey K, PA-C      I have reviewed the PDMP during this encounter.  Jeani Hawking, PA-C 07/23/23 1308

## 2023-07-23 NOTE — ED Triage Notes (Signed)
Pt reports left lower dental pain from broken tooth since before Christmas. Left side of face swollen since Christmas day. He took aleve prior to arrival

## 2023-07-25 ENCOUNTER — Other Ambulatory Visit: Payer: Self-pay | Admitting: Family Medicine

## 2023-07-25 ENCOUNTER — Other Ambulatory Visit (HOSPITAL_COMMUNITY): Payer: Self-pay

## 2023-07-25 ENCOUNTER — Other Ambulatory Visit: Payer: Self-pay

## 2023-07-25 DIAGNOSIS — D571 Sickle-cell disease without crisis: Secondary | ICD-10-CM

## 2023-07-25 MED ORDER — OXYCODONE HCL 10 MG PO TABS
10.0000 mg | ORAL_TABLET | Freq: Four times a day (QID) | ORAL | 0 refills | Status: DC | PRN
  Filled 2023-07-25: qty 60, 15d supply, fill #0

## 2023-07-25 MED ORDER — SILDENAFIL CITRATE 100 MG PO TABS
100.0000 mg | ORAL_TABLET | Freq: Every day | ORAL | 0 refills | Status: DC | PRN
  Filled 2023-07-25: qty 10, 10d supply, fill #0

## 2023-07-29 ENCOUNTER — Other Ambulatory Visit (HOSPITAL_COMMUNITY): Payer: Self-pay

## 2023-08-03 ENCOUNTER — Encounter (HOSPITAL_COMMUNITY): Payer: Self-pay

## 2023-08-03 ENCOUNTER — Emergency Department (HOSPITAL_COMMUNITY)
Admission: EM | Admit: 2023-08-03 | Discharge: 2023-08-04 | Disposition: A | Discharge status: Home / Self Care | Payer: 59 | Attending: Emergency Medicine | Admitting: Emergency Medicine

## 2023-08-03 ENCOUNTER — Other Ambulatory Visit: Payer: Self-pay

## 2023-08-03 DIAGNOSIS — D5700 Hb-SS disease with crisis, unspecified: Secondary | ICD-10-CM | POA: Diagnosis present

## 2023-08-03 NOTE — ED Triage Notes (Signed)
 Pt states that he is having bilateral knee and lower back pain similar to sickle cell pain

## 2023-08-04 LAB — COMPREHENSIVE METABOLIC PANEL
ALT: 18 U/L (ref 0–44)
AST: 12 U/L — ABNORMAL LOW (ref 15–41)
Albumin: 4.5 g/dL (ref 3.5–5.0)
Alkaline Phosphatase: 52 U/L (ref 38–126)
Anion gap: 8 (ref 5–15)
BUN: 9 mg/dL (ref 6–20)
CO2: 25 mmol/L (ref 22–32)
Calcium: 9.4 mg/dL (ref 8.9–10.3)
Chloride: 105 mmol/L (ref 98–111)
Creatinine, Ser: 0.64 mg/dL (ref 0.61–1.24)
GFR, Estimated: >60 mL/min (ref >60)
Glucose, Bld: 123 mg/dL — ABNORMAL HIGH (ref 70–99)
Potassium: 3.7 mmol/L (ref 3.5–5.1)
Sodium: 138 mmol/L (ref 135–145)
Total Bilirubin: 1.5 mg/dL — ABNORMAL HIGH (ref 0.0–1.2)
Total Protein: 7.6 g/dL (ref 6.5–8.1)

## 2023-08-04 LAB — CBC WITH DIFFERENTIAL/PLATELET
Abs Immature Granulocytes: 0.03 10*3/uL (ref 0.00–0.07)
Basophils Absolute: 0 10*3/uL (ref 0.0–0.1)
Basophils Relative: 0 %
Eosinophils Absolute: 0.1 10*3/uL (ref 0.0–0.5)
Eosinophils Relative: 1 %
HCT: 36.9 % — ABNORMAL LOW (ref 39.0–52.0)
Hemoglobin: 13.4 g/dL (ref 13.0–17.0)
Immature Granulocytes: 0 %
Lymphocytes Relative: 34 %
Lymphs Abs: 3.4 10*3/uL (ref 0.7–4.0)
MCH: 28.3 pg (ref 26.0–34.0)
MCHC: 36.3 g/dL — ABNORMAL HIGH (ref 30.0–36.0)
MCV: 78 fL — ABNORMAL LOW (ref 80.0–100.0)
Monocytes Absolute: 0.6 10*3/uL (ref 0.1–1.0)
Monocytes Relative: 6 %
Neutro Abs: 5.8 10*3/uL (ref 1.7–7.7)
Neutrophils Relative %: 59 %
Platelets: 152 10*3/uL (ref 150–400)
RBC: 4.73 MIL/uL (ref 4.22–5.81)
RDW: 14.5 % (ref 11.5–15.5)
WBC: 10 10*3/uL (ref 4.0–10.5)
nRBC: 0.3 % — ABNORMAL HIGH (ref 0.0–0.2)

## 2023-08-04 LAB — RETICULOCYTES
Immature Retic Fract: 21.7 % — ABNORMAL HIGH (ref 2.3–15.9)
RBC.: 4.62 MIL/uL (ref 4.22–5.81)
Retic Count, Absolute: 178.8 10*3/uL (ref 19.0–186.0)
Retic Ct Pct: 3.9 % — ABNORMAL HIGH (ref 0.4–3.1)

## 2023-08-04 MED ORDER — KETOROLAC TROMETHAMINE 15 MG/ML IJ SOLN
15.0000 mg | Freq: Once | INTRAMUSCULAR | Status: AC
  Administered 2023-08-04: 15 mg via INTRAMUSCULAR
  Filled 2023-08-04: qty 1

## 2023-08-04 MED ORDER — KETOROLAC TROMETHAMINE 15 MG/ML IJ SOLN: 15.0000 mg | INTRAMUSCULAR | Status: DC

## 2023-08-04 MED ORDER — HYDROMORPHONE HCL 2 MG/ML IJ SOLN
2.0000 mg | INTRAMUSCULAR | Status: AC
  Administered 2023-08-04: 2 mg via SUBCUTANEOUS
  Filled 2023-08-04: qty 1

## 2023-08-04 MED ORDER — ONDANSETRON 8 MG PO TBDP
8.0000 mg | ORAL_TABLET | Freq: Three times a day (TID) | ORAL | Status: DC | PRN
  Administered 2023-08-04: 8 mg via ORAL
  Filled 2023-08-04: qty 1

## 2023-08-04 MED ORDER — DIPHENHYDRAMINE HCL 25 MG PO CAPS
25.0000 mg | ORAL_CAPSULE | ORAL | Status: DC | PRN
  Administered 2023-08-04: 50 mg via ORAL
  Filled 2023-08-04: qty 2

## 2023-08-04 NOTE — ED Provider Notes (Signed)
 [place] [facility] AT [facility] Provider Note   CSN: [id number] Arrival date & time: 08/03/23  2255     History  Chief Complaint  Patient presents with   Sickle Cell Pain Crisis    QUINLIN CONANT is a 33 y.o. male.  Patient with past medical history significant for sickle cell anemia, chronic pain syndrome, bilateral lower extremity pain presents to the emergency department complaining of bilateral knee and low back pain that he feels may be due to a sickle cell crisis.  Patient states the pain has been worsening throughout the past 24 hours.  He states he has been taking his home oxycodone  with no relief.  He denies chest pain, shortness of breath, abdominal pain, nausea, vomiting.   Sickle Cell Pain Crisis      Home Medications Prior to Admission medications   Medication Sig Start Date End Date Taking? Authorizing Provider  amoxicillin -clavulanate (AUGMENTIN ) 875-125 MG tablet Take 1 tablet by mouth every 12 (twelve) hours. 07/23/23  Yes Raspet, Erin K, PA-C  dicyclomine  (BENTYL ) 20 MG tablet TAKE 1 TABLET BY MOUTH TWICE A DAY 03/25/22  Yes Jegede, Olugbemiga E, MD  folic acid  (FOLVITE ) 1 MG tablet Take 1 tablet (1 mg total) by mouth daily. 08/12/22 08/12/23 Yes Tilford Bertram HERO, FNP  ondansetron  (ZOFRAN -ODT) 4 MG disintegrating tablet Dissolve 1 tablet (4 mg total) by mouth every 6 (six) hours as needed for nausea or vomiting. 04/14/23  Yes Tilford Bertram HERO, FNP  Oxycodone  HCl 10 MG TABS Take 1 tablet (10 mg) by mouth every 6 hours as needed. 07/25/23  Yes Jegede, Olugbemiga E, MD  sildenafil  (VIAGRA ) 100 MG tablet Take 1 tablet (100 mg) by mouth daily as needed (for E.D.). 07/25/23  Yes Jegede, Olugbemiga E, MD  bacitracin  ointment Apply 1 Application topically 2 (two) times daily. Patient not taking: Reported on 06/28/2023 04/11/23   Henderly, Britni A, PA-C  loratadine  (CLARITIN ) 10 MG tablet Take 1 tablet (10 mg total) by mouth daily. Patient taking  differently: Take 10 mg by mouth daily as needed for allergies or rhinitis. 11/13/20   Myrna Camelia HERO, NP  naproxen  (NAPROSYN ) 500 MG tablet TAKE 1 TABLET BY MOUTH 2 TIMES DAILY WITH A MEAL. Patient not taking: Reported on 06/28/2023 11/10/22   Oley Bascom RAMAN, NP  ondansetron  (ZOFRAN ) 4 MG tablet Take 1 tablet (4 mg total) by mouth every 6 (six) hours. Patient not taking: Reported on 06/28/2023 03/01/23   Waylan Elsie PARAS, PA-C  promethazine  (PHENERGAN ) 25 MG tablet Take 1 tablet (25 mg total) by mouth every 6 (six) hours as needed for nausea or vomiting. Patient not taking: Reported on 06/28/2023 02/02/23   Charlyn Sora, MD  promethazine  (PHENERGAN ) 25 MG tablet Take 1 tablet (25 mg total) by mouth every 6 (six) hours as needed for nausea or vomiting. 04/15/23   Zackowski, Scott, MD  triamcinolone  (NASACORT ) 55 MCG/ACT AERO nasal inhaler Place 2 sprays into the nose daily. 2 sprays each nostril at night Patient not taking: Reported on 06/28/2023 04/27/21   Ethyl Lonni BRAVO, MD  Vitamin D , Ergocalciferol , (DRISDOL ) 1.25 MG (50000 UNIT) CAPS capsule TAKE 1 CAPSULE (50,000 UNITS TOTAL) BY MOUTH ONCE A WEEK. X 12 WEEKS. Patient not taking: Reported on 06/28/2023 04/13/22   Oley Bascom RAMAN, NP      Allergies    Patient has no known allergies.    Review of Systems   Review of Systems  Physical Exam Updated Vital Signs BP 125/70  Pulse 72   Temp 98 F (36.7 C) (Oral)   Resp 14   Ht 5' 8 (1.727 m)   Wt 77.1 kg   SpO2 98%   BMI 25.85 kg/m  Physical Exam Vitals and nursing note reviewed.  Constitutional:      General: He is not in acute distress.    Appearance: He is well-developed.  HENT:     Head: Normocephalic and atraumatic.  Eyes:     Conjunctiva/sclera: Conjunctivae normal.  Cardiovascular:     Rate and Rhythm: Normal rate and regular rhythm.  Pulmonary:     Effort: Pulmonary effort is normal. No respiratory distress.     Breath sounds: Normal breath sounds.  Abdominal:      Palpations: Abdomen is soft.     Tenderness: There is no abdominal tenderness.  Musculoskeletal:        General: No swelling. Normal range of motion.     Cervical back: Neck supple.  Skin:    General: Skin is warm and dry.     Capillary Refill: Capillary refill takes less than 2 seconds.  Neurological:     Mental Status: He is alert.  Psychiatric:        Mood and Affect: Mood normal.     ED Results / Procedures / Treatments   Labs (all labs ordered are listed, but only abnormal results are displayed) Labs Reviewed  COMPREHENSIVE METABOLIC PANEL - Abnormal; Notable for the following components:      Result Value   Glucose, Bld 123 (*)    AST 12 (*)    Total Bilirubin 1.5 (*)    All other components within normal limits  CBC WITH DIFFERENTIAL/PLATELET - Abnormal; Notable for the following components:   HCT 36.9 (*)    MCV 78.0 (*)    MCHC 36.3 (*)    nRBC 0.3 (*)    All other components within normal limits  RETICULOCYTES - Abnormal; Notable for the following components:   Retic Ct Pct 3.9 (*)    Immature Retic Fract 21.7 (*)    All other components within normal limits    EKG None  Radiology No results found.  Procedures Procedures    Medications Ordered in ED Medications  diphenhydrAMINE  (BENADRYL ) capsule 25-50 mg (50 mg Oral Given 08/04/23 0408)  ondansetron  (ZOFRAN -ODT) disintegrating tablet 8 mg (8 mg Oral Given 08/04/23 0408)  HYDROmorphone  (DILAUDID ) injection 2 mg (2 mg Subcutaneous Given 08/04/23 0411)  HYDROmorphone  (DILAUDID ) injection 2 mg (2 mg Subcutaneous Given 08/04/23 0444)  HYDROmorphone  (DILAUDID ) injection 2 mg (2 mg Subcutaneous Given 08/04/23 0514)  ketorolac  (TORADOL ) 15 MG/ML injection 15 mg (15 mg Intramuscular Given 08/04/23 0417)    ED Course/ Medical Decision Making/ A&P                                 Medical Decision Making Amount and/or Complexity of Data Reviewed Labs: ordered.  Risk Prescription drug management.   This  patient presents to the ED for concern of bilateral lower extremity pain, back pain, this involves an extensive number of treatment options, and is a complaint that carries with it a high risk of complications and morbidity.  The differential diagnosis includes sickle cell crisis, chronic pain, fracture, dislocation, others   Co morbidities that complicate the patient evaluation  Sickle cell disease   Additional history obtained:   External records from outside source obtained and reviewed including discharge summary  from December 5   Lab Tests:  I Ordered, and personally interpreted labs.  The pertinent results include: Reticulocyte panel at baseline, grossly unremarkable CBC, CMP   Problem List / ED Course / Critical interventions / Medication management   I ordered medication including Toradol , Dilaudid  for pain, Zofran  for nausea, Benadryl  for itching Reevaluation of the patient after these medicines showed that the patient improved I have reviewed the patients home medicines and have made adjustments as needed   Test / Admission - Considered:  Patient feeling much better after medication administration.  At this time no sign of acute crisis or acute chest.  Differential includes chronic pain versus sickle cell disease with pain.  No indication for further emergent workup or admission.  Will discharge home.  Patient to follow-up with sickle cell team for further management as needed         Final Clinical Impression(s) / ED Diagnoses Final diagnoses:  Sickle cell anemia with pain [facility])    Rx / DC Orders ED Discharge Orders     None         Logan Ubaldo KATHEE DEVONNA 08/04/23 0557    Bari Charmaine FALCON, MD 08/04/23 [phone]

## 2023-08-04 NOTE — Discharge Instructions (Addendum)
 Please follow-up with your primary team as needed.  If you develop any life-threatening symptoms return to the emergency department.

## 2023-08-09 ENCOUNTER — Other Ambulatory Visit: Payer: Self-pay | Admitting: Internal Medicine

## 2023-08-09 DIAGNOSIS — D571 Sickle-cell disease without crisis: Secondary | ICD-10-CM

## 2023-08-09 NOTE — Telephone Encounter (Signed)
   Notes to clinic: [facility] received this refill request- request forwarded- do not fill Rx for this provider  Requested Prescriptions  Pending Prescriptions Disp Refills   Oxycodone  HCl 10 MG TABS 60 tablet 0    Sig: Take 1 tablet (10 mg) by mouth every 6 hours as needed.     There is no refill protocol information for this order       Requested Prescriptions  Pending Prescriptions Disp Refills   Oxycodone  HCl 10 MG TABS 60 tablet 0    Sig: Take 1 tablet (10 mg) by mouth every 6 hours as needed.     There is no refill protocol information for this order

## 2023-08-15 ENCOUNTER — Other Ambulatory Visit (HOSPITAL_COMMUNITY): Payer: Self-pay

## 2023-08-17 ENCOUNTER — Other Ambulatory Visit (HOSPITAL_COMMUNITY): Payer: Self-pay

## 2023-08-17 ENCOUNTER — Emergency Department (HOSPITAL_COMMUNITY)
Admission: EM | Admit: 2023-08-17 | Discharge: 2023-08-17 | Disposition: A | Discharge status: Home / Self Care | Payer: 59 | Attending: Emergency Medicine | Admitting: Emergency Medicine

## 2023-08-17 ENCOUNTER — Encounter (HOSPITAL_COMMUNITY): Payer: Self-pay

## 2023-08-17 ENCOUNTER — Telehealth: Payer: Self-pay | Admitting: Family Medicine

## 2023-08-17 ENCOUNTER — Other Ambulatory Visit: Payer: Self-pay

## 2023-08-17 DIAGNOSIS — D5700 Hb-SS disease with crisis, unspecified: Secondary | ICD-10-CM | POA: Diagnosis present

## 2023-08-17 DIAGNOSIS — M791 Myalgia, unspecified site: Secondary | ICD-10-CM | POA: Insufficient documentation

## 2023-08-17 LAB — CBC WITH DIFFERENTIAL/PLATELET
Abs Immature Granulocytes: 0.03 10*3/uL (ref 0.00–0.07)
Basophils Absolute: 0 10*3/uL (ref 0.0–0.1)
Basophils Relative: 0 %
Eosinophils Absolute: 0.1 10*3/uL (ref 0.0–0.5)
Eosinophils Relative: 1 %
HCT: 39.7 % (ref 39.0–52.0)
Hemoglobin: 14.3 g/dL (ref 13.0–17.0)
Immature Granulocytes: 0 %
Lymphocytes Relative: 25 %
Lymphs Abs: 2.6 10*3/uL (ref 0.7–4.0)
MCH: 28.2 pg (ref 26.0–34.0)
MCHC: 36 g/dL (ref 30.0–36.0)
MCV: 78.3 fL — ABNORMAL LOW (ref 80.0–100.0)
Monocytes Absolute: 0.9 10*3/uL (ref 0.1–1.0)
Monocytes Relative: 8 %
Neutro Abs: 7.1 10*3/uL (ref 1.7–7.7)
Neutrophils Relative %: 66 %
Platelets: 135 10*3/uL — ABNORMAL LOW (ref 150–400)
RBC: 5.07 MIL/uL (ref 4.22–5.81)
RDW: 14.8 % (ref 11.5–15.5)
WBC: 10.7 10*3/uL — ABNORMAL HIGH (ref 4.0–10.5)
nRBC: 0.4 % — ABNORMAL HIGH (ref 0.0–0.2)

## 2023-08-17 LAB — RETICULOCYTES
Immature Retic Fract: 28.9 % — ABNORMAL HIGH (ref 2.3–15.9)
RBC.: 5.05 MIL/uL (ref 4.22–5.81)
Retic Count, Absolute: 231.8 10*3/uL — ABNORMAL HIGH (ref 19.0–186.0)
Retic Ct Pct: 4.6 % — ABNORMAL HIGH (ref 0.4–3.1)

## 2023-08-17 LAB — COMPREHENSIVE METABOLIC PANEL
ALT: 18 U/L (ref 0–44)
AST: 12 U/L — ABNORMAL LOW (ref 15–41)
Albumin: 4.8 g/dL (ref 3.5–5.0)
Alkaline Phosphatase: 53 U/L (ref 38–126)
Anion gap: 11 (ref 5–15)
BUN: 13 mg/dL (ref 6–20)
CO2: 25 mmol/L (ref 22–32)
Calcium: 9.7 mg/dL (ref 8.9–10.3)
Chloride: 101 mmol/L (ref 98–111)
Creatinine, Ser: 0.76 mg/dL (ref 0.61–1.24)
GFR, Estimated: >60 mL/min (ref >60)
Glucose, Bld: 80 mg/dL (ref 70–99)
Potassium: 4.1 mmol/L (ref 3.5–5.1)
Sodium: 137 mmol/L (ref 135–145)
Total Bilirubin: 1.6 mg/dL — ABNORMAL HIGH (ref 0.0–1.2)
Total Protein: 8.4 g/dL — ABNORMAL HIGH (ref 6.5–8.1)

## 2023-08-17 MED ORDER — ONDANSETRON HCL 4 MG/2ML IJ SOLN
4.0000 mg | Freq: Once | INTRAMUSCULAR | Status: AC
  Administered 2023-08-17: 4 mg via INTRAVENOUS
  Filled 2023-08-17: qty 2

## 2023-08-17 MED ORDER — KETOROLAC TROMETHAMINE 15 MG/ML IJ SOLN
15.0000 mg | Freq: Once | INTRAMUSCULAR | Status: AC
  Administered 2023-08-17: 15 mg via INTRAVENOUS
  Filled 2023-08-17: qty 1

## 2023-08-17 MED ORDER — HYDROMORPHONE HCL 2 MG/ML IJ SOLN
2.0000 mg | Freq: Once | INTRAMUSCULAR | Status: AC
  Administered 2023-08-17: 2 mg via SUBCUTANEOUS
  Filled 2023-08-17: qty 1

## 2023-08-17 MED ORDER — OXYCODONE HCL 10 MG PO TABS
10.0000 mg | ORAL_TABLET | Freq: Four times a day (QID) | ORAL | 0 refills | Status: DC | PRN
  Filled 2023-08-17: qty 15, 4d supply, fill #0

## 2023-08-17 NOTE — ED Notes (Signed)
Blue, light green, dark green and lavender tubes sent to lab

## 2023-08-17 NOTE — Discharge Instructions (Addendum)
You were seen today for concerns of sickle cell pain. Your pain responded well to Dilaudid. I have sent a small refill of your Oxycodone to your pharmacy while you wait to have your refill from [facility]. Return to the ER for chest pain or shortness of breath.

## 2023-08-17 NOTE — Telephone Encounter (Signed)
Pt came in requesting a refill on his oxycodone

## 2023-08-17 NOTE — ED Provider Notes (Signed)
[place] EMERGENCY DEPARTMENT AT [facility] Provider Note   CSN: [id number] Arrival date & time: 08/17/23  1231     History Chief Complaint  Patient presents with   Sickle Cell Pain Crisis    Austin [name] is a 33 y.o. male.  Patient with past history significant for sickle cell anemia presents the emergency department with concerns of sickle cell pain crisis.  Reports pain in his legs abdomen and back.  He typically takes oxycodone 10 mg up to 4 times daily but states that he has been out of his medication about 2 weeks.  Has tried managing with, negative Profen without improvement in symptoms. He currently denies any chest pain or shortness of breath.   Sickle Cell Pain Crisis      Home Medications Prior to Admission medications   Medication Sig Start Date End Date Taking? Authorizing Provider  Oxycodone HCl 10 MG TABS Take 1 tablet (10 mg total) by mouth 4 (four) times daily as needed (For severe pain). 08/17/23  Yes Maryanna Shape A, PA-C  amoxicillin-clavulanate (AUGMENTIN) 875-125 MG tablet Take 1 tablet by mouth every 12 (twelve) hours. 07/23/23   Raspet, Noberto Retort, PA-C  bacitracin ointment Apply 1 Application topically 2 (two) times daily. Patient not taking: Reported on 06/28/2023 04/11/23   Henderly, Britni A, PA-C  dicyclomine (BENTYL) 20 MG tablet TAKE 1 TABLET BY MOUTH TWICE A DAY 03/25/22   Quentin Angst, MD  loratadine (CLARITIN) 10 MG tablet Take 1 tablet (10 mg total) by mouth daily. Patient taking differently: Take 10 mg by mouth daily as needed for allergies or rhinitis. 11/13/20   Barbette Merino, NP  naproxen (NAPROSYN) 500 MG tablet TAKE 1 TABLET BY MOUTH 2 TIMES DAILY WITH A MEAL. Patient not taking: Reported on 06/28/2023 11/10/22   Ivonne Andrew, NP  ondansetron (ZOFRAN) 4 MG tablet Take 1 tablet (4 mg total) by mouth every 6 (six) hours. Patient not taking: Reported on 06/28/2023 03/01/23   Carroll Sage, PA-C  ondansetron  (ZOFRAN-ODT) 4 MG disintegrating tablet Dissolve 1 tablet (4 mg total) by mouth every 6 (six) hours as needed for nausea or vomiting. 04/14/23   Massie Maroon, FNP  Oxycodone HCl 10 MG TABS Take 1 tablet (10 mg) by mouth every 6 hours as needed. 07/25/23   Quentin Angst, MD  promethazine (PHENERGAN) 25 MG tablet Take 1 tablet (25 mg total) by mouth every 6 (six) hours as needed for nausea or vomiting. Patient not taking: Reported on 06/28/2023 02/02/23   Derwood Kaplan, MD  promethazine (PHENERGAN) 25 MG tablet Take 1 tablet (25 mg total) by mouth every 6 (six) hours as needed for nausea or vomiting. 04/15/23   Vanetta Mulders, MD  sildenafil (VIAGRA) 100 MG tablet Take 1 tablet (100 mg) by mouth daily as needed (for E.D.). 07/25/23   Quentin Angst, MD  triamcinolone (NASACORT) 55 MCG/ACT AERO nasal inhaler Place 2 sprays into the nose daily. 2 sprays each nostril at night Patient not taking: Reported on 06/28/2023 04/27/21   Drema Halon, MD  Vitamin D, Ergocalciferol, (DRISDOL) 1.25 MG (50000 UNIT) CAPS capsule TAKE 1 CAPSULE (50,000 UNITS TOTAL) BY MOUTH ONCE A WEEK. X 12 WEEKS. Patient not taking: Reported on 06/28/2023 04/13/22   Ivonne Andrew, NP      Allergies    Patient has no known allergies.    Review of Systems   Review of Systems  Musculoskeletal:  Positive for myalgias.  All other systems reviewed and are negative.   Physical Exam Updated Vital Signs BP 113/70 (BP Location: Left Arm)   Pulse 94   Temp 98.3 F (36.8 C) (Oral)   Resp 16   Ht 5\' 8"  (1.727 m)   Wt 77.1 kg   SpO2 99%   BMI 25.85 kg/m  Physical Exam Vitals and nursing note reviewed.  Constitutional:      General: He is not in acute distress.    Appearance: He is well-developed.  HENT:     Head: Normocephalic and atraumatic.  Eyes:     Conjunctiva/sclera: Conjunctivae normal.  Cardiovascular:     Rate and Rhythm: Normal rate and regular rhythm.     Heart sounds: No murmur  heard. Pulmonary:     Effort: Pulmonary effort is normal. No respiratory distress.     Breath sounds: Normal breath sounds.  Abdominal:     Palpations: Abdomen is soft.     Tenderness: There is no abdominal tenderness.  Musculoskeletal:        General: No swelling.     Cervical back: Neck supple.  Skin:    General: Skin is warm and dry.     Capillary Refill: Capillary refill takes less than 2 seconds.  Neurological:     Mental Status: He is alert.  Psychiatric:        Mood and Affect: Mood normal.     ED Results / Procedures / Treatments   Labs (all labs ordered are listed, but only abnormal results are displayed) Labs Reviewed  COMPREHENSIVE METABOLIC PANEL - Abnormal; Notable for the following components:      Result Value   Total Protein 8.4 (*)    AST 12 (*)    Total Bilirubin 1.6 (*)    All other components within normal limits  CBC WITH DIFFERENTIAL/PLATELET - Abnormal; Notable for the following components:   WBC 10.7 (*)    MCV 78.3 (*)    Platelets 135 (*)    nRBC 0.4 (*)    All other components within normal limits  RETICULOCYTES - Abnormal; Notable for the following components:   Retic Ct Pct 4.6 (*)    Retic Count, Absolute 231.8 (*)    Immature Retic Fract 28.9 (*)    All other components within normal limits    EKG None  Radiology No results found.  Procedures Procedures    Medications Ordered in ED Medications  HYDROmorphone (DILAUDID) injection 2 mg (2 mg Subcutaneous Given 08/17/23 1444)  ketorolac (TORADOL) 15 MG/ML injection 15 mg (15 mg Intravenous Given 08/17/23 1443)  ondansetron (ZOFRAN) injection 4 mg (4 mg Intravenous Given 08/17/23 1443)  HYDROmorphone (DILAUDID) injection 2 mg (2 mg Subcutaneous Given 08/17/23 1526)    ED Course/ Medical Decision Making/ A&P                                 Medical Decision Making Amount and/or Complexity of Data Reviewed Labs: ordered.  Risk Prescription drug management.   This patient  presents to the ED for concern of sickle cell pain. Differential diagnosis includes sickle cell crisis, chronic pain, medication noncompliance   Lab Tests:  I Ordered, and personally interpreted labs.  The pertinent results include: CBC with mild leukocytosis progressive baseline of 10.7, reticulocyte count consistent with baseline, CMP unremarkable   Medicines ordered and prescription drug management:  I ordered medication including Dilaudid, Toradol, Zofran for pain, nausea Reevaluation  of the patient after these medicines showed that the patient improved I have reviewed the patients home medicines and have made adjustments as needed   Problem List / ED Course:  Patient presents to the emergency department concerns of sickle cell pain crisis.  He reports that he is currently any pain in his legs, abdomen, and back.  He states that he has been out of his home medication which is oxycodone.  I suspect patient's pain is likely due to poor pain management at home.  Labs ordered from triage for evaluation of patient's sickle cell crisis.  Areas of pain are nontender to palpation and there is no evident signs of infection. Discussed care plan with patient and will administer a dose of Dilaudid and Toradol and try to manage pain here acutely with plan for discharge home with a prescription of patient's outpatient oxycodone in place. Basic labs are unremarkable at this time.  Mild leukocytosis with patient otherwise appears to be slightly leukocytotic. On evaluation, patient had mild improvement in pain will repeat dose of Dilaudid.  Again reiterated that plan will be for discharge home so patient can continue managing pain at home with over-the-counter medications and combination with patient's OxyContin. Patient reports stabilizing pain after second dose of Dilaudid.  Will discharge home at this time with refill for oxycodone sent to pharmacy as patient is still waiting for his PCP to refill  medication. Advised strict return precautions. Patient otherwise stable and discharged home.   Final Clinical Impression(s) / ED Diagnoses Final diagnoses:  Sickle cell pain crisis [facility])    Rx / DC Orders ED Discharge Orders          Ordered    Oxycodone HCl 10 MG TABS  4 times daily PRN        08/17/23 1537              Smitty Knudsen, PA-C 08/17/23 1539    Franne Forts, DO 08/18/23 [phone]

## 2023-08-17 NOTE — ED Triage Notes (Signed)
Pt reporting SCC crisis with pain in legs, abd, and back. Pt has not had any medications, has been out for a week.

## 2023-08-19 ENCOUNTER — Other Ambulatory Visit (HOSPITAL_COMMUNITY): Payer: Self-pay

## 2023-08-26 ENCOUNTER — Encounter: Payer: Self-pay | Admitting: Nurse Practitioner

## 2023-08-26 ENCOUNTER — Ambulatory Visit (INDEPENDENT_AMBULATORY_CARE_PROVIDER_SITE_OTHER): Payer: PRIVATE HEALTH INSURANCE | Admitting: Nurse Practitioner

## 2023-08-26 ENCOUNTER — Other Ambulatory Visit (HOSPITAL_COMMUNITY): Payer: Self-pay

## 2023-08-26 VITALS — BP 130/79 | HR 66 | Temp 97.1°F | Wt 182.0 lb

## 2023-08-26 DIAGNOSIS — N529 Male erectile dysfunction, unspecified: Secondary | ICD-10-CM

## 2023-08-26 DIAGNOSIS — D571 Sickle-cell disease without crisis: Secondary | ICD-10-CM | POA: Diagnosis not present

## 2023-08-26 MED ORDER — OXYCODONE HCL 10 MG PO TABS
10.0000 mg | ORAL_TABLET | Freq: Four times a day (QID) | ORAL | 0 refills | Status: DC | PRN
  Filled 2023-08-26: qty 60, 15d supply, fill #0

## 2023-08-26 MED ORDER — PROMETHAZINE HCL 25 MG PO TABS
25.0000 mg | ORAL_TABLET | Freq: Four times a day (QID) | ORAL | 0 refills | Status: DC | PRN
  Filled 2023-08-26: qty 20, 5d supply, fill #0

## 2023-08-26 NOTE — Patient Instructions (Addendum)
1. Sickle cell anemia without crisis (HCC) (Primary)  - Drug Screen 11 w/Conf, Ser - Sickle Cell Panel - Oxycodone HCl 10 MG TABS; Take 1 tablet (10 mg) by mouth every 6 hours as needed.  Dispense: 60 tablet; Refill: 0  Living With Sickle Cell Disease Living with a long-term condition, such as sickle cell disease, can be a challenge. It can affect both your physical and mental health. You may not have total control over your condition. But proper care and treatment can help manage the effects of the disease so you can feel good and lead an active life. You can take steps to manage your condition and stay as healthy as possible. How does sickle cell disease affect me? Sickle cell disease can cause challenges that affect your quality of life. You may get sick more often as a result of organ damage and infections. Sometimes you may need to stay in the hospital. Learn how to recognize that you are not feeling well and that you may be getting sick. What actions can I take to manage my condition?  The goals of treatment are to control your symptoms and prevent and treat problems. Work with your health care provider to create a treatment plan that works for you. Taking an active role in managing your condition can help you feel more in control of your situation. Ask about possible side effects of medicines that your health care provider recommends. Discuss how you feel about having those side effects. Keeping a healthy lifestyle can help you manage your condition. This includes eating a healthy diet, getting enough sleep, and getting regular exercise. Sickle cell disease may affect your ability to take care of your basic needs. Tell your health care provider if you have concerns about any of these needs: Access to food. Housing. Safe drinking water and other utilities. Safety in your home and community. Work or school. Transportation. Paying for health care. Your health care provider may be able to  connect you with community resources that can help you. How to manage stress  Living with sickle cell disease can be stressful. This disease can have a big impact on your mental health. Talk with your health care provider about ways to reduce your stress or if you have concerns about your mental health.  To cope with stress, try: Keeping a stress diary. This can help you learn what causes your stress to start (figure out your triggers) and how to control your response to those triggers. Spending time doing things that you enjoy, such as: Hobbies. Being outdoors. Spending time with friends and people who make you laugh. Doing yoga, muscle relaxation, deep breathing, or mindfulness practices. Expressing yourself through journal writing, art, crafting, poetry, or playing music. Staying positive about your health. Try to accept that you cannot control your condition perfectly. Follow these instructions at home: Medicines Take over-the-counter and prescription medicines only as told by your health care provider. If you were prescribed antibiotics, take them as told by your health care provider. Do not stop taking them even if you start to feel better. If you develop a fever, do not take medicines to reduce the fever right away. This could cover up another problem. Contact your health care provider. Eating and drinking Drink enough fluid to keep your urine pale yellow. Drink more in hot weather and during exercise. Limit or avoid drinking alcohol. Eat a balanced and nutritious diet. Eat plenty of fruits, vegetables, whole grains, and lean protein. Take vitamins and  supplements as told by your health care provider. Traveling When traveling, keep these with you: Your medical information. The names of your health care providers. Your medicines. If you have to travel by air, ask about precautions you should take. Managing pain Work with your health care provider to create a pain management plan  that works for you. The plan may include: Ways to reduce or manage your pain at home, such as: Using a heating pad. Taking a warm bath. Using healthy ways to distract you from the pain, such as hobbies or reading. Practicing ways to relax, such as doing yoga or listening to music. Getting massages. Doing exercises or stretches as told by a physical therapist. Tracking how pain affects your daily life functions. When to seek help. Who to contact and what to do in case of a pain emergency. General instructions Do not use any products that contain nicotine or tobacco. These products include cigarettes, chewing tobacco, and vaping devices, such as e-cigarettes. These lower blood oxygen levels. If you need help quitting, ask your health care provider. Consider wearing a medical alert bracelet. Use an app or journal to track your symptoms, assess your level of pain and fatigue, and keep track of your medicines. Avoid the following: High altitudes. Very high or low temperatures and big changes in temperature. Activities that will lower your oxygen levels, such as mountain climbing or doing exercise that takes a lot of effort. Stay up to date on: Your treatment plan. Learn as much as you can about your condition. Health screenings. This will help prevent problems or catch them early on. Vaccines. This will help prevent infection. Wash your hands often with soap and water to help prevent infections. Wash them for at least 20 seconds each time. Keep all follow-up visits. Regular follow-up with your health care provider can help you better manage your condition. Where to find support You can find help and support through: Talking with a therapist or taking part in support groups. Sickle Cell Disease Foundation of [place]: www.sicklecelldisease.org Where to find more information Centers for Disease Control and Prevention: FootballExhibition.com.br American Society of Hematology: www.hematology.org Contact a  health care provider if: Your symptoms get worse. You have new symptoms. You have a fever. Get help right away if: You have a painful erection of the penis that lasts a long time (priapism). You become short of breath or are having trouble breathing. You have pain that cannot be controlled with medicine. You have any signs of a stroke. "BE FAST" is an easy way to remember the main warning signs: B - Balance. Dizziness, sudden trouble walking, or loss of balance. E - Eyes. Trouble seeing or a change in how you see. F - Face. Sudden weakness or loss of feeling of the face. The face or eyelid may droop on one side. A - Arms. Weakness or loss of feeling in an arm. This happens all of a sudden and most often on one side of the body. S - Speech. Sudden trouble speaking, slurred speech, or trouble understanding what people say. T - Time. Time to call emergency services. Write down what time symptoms started. You have other signs of a stroke, such as: A sudden, very bad headache with no known cause. Feeling like you may vomit (nausea). Vomiting. Seizure. These symptoms may be an emergency. Get help right away. Call 911. Do not wait to see if the symptoms will go away. Do not drive yourself to the hospital. Also, get help right  away if: You have strong feelings of sadness or loss of hope, or you have thoughts about hurting yourself or others. Take one of these steps if you feel like you may hurt yourself or others, or have thoughts about taking your own life: Go to your nearest emergency room. Call 911. Call the National Suicide Prevention Lifeline at (512)884-3981 or 988. This is open 24 hours a day. Text the Crisis Text Line at 641-822-3206. Summary Proper care and treatment can help manage the effects of sickle cell disease so you can feel good and lead an active life. The goals of treatment are to control your symptoms and prevent and treat problems. Taking an active role in managing your  condition can help you feel more in control of your situation. Work with your health care provider to create a pain management plan that works for you. Get medical help right away as told by your health care provider. This information is not intended to replace advice given to you by your health care provider. Make sure you discuss any questions you have with your health care provider. Document Revised: 10/19/2021 Document Reviewed: 10/19/2021 Elsevier Patient Education  2024 Elsevier Inc.   Follow up:  Follow up in 3 months

## 2023-08-26 NOTE — Progress Notes (Signed)
Subjective   Patient ID: Austin [name], male    DOB: 05/16/1991, 33 y.o.   MRN: [id number]  Chief Complaint  Patient presents with   Establish Care    Referring provider: Massie Maroon, FNP  Austin [name] is a 33 y.o. male with Past Medical History: No date: Acute maxillary sinusitis No date: Eczema .: Sickle cell anemia (HCC)   HPI    Patient presents today for a follow-up visit.  This is a patient of Dr. Hyman Hopes.  He has had several ED visits for sickle cell crisis over the past few months.  Patient usually takes oxycodone HCL 10 mg for sickle cell pain. Denies f/c/s, n/v/d, hemoptysis, PND, leg swelling. Denies chest pain or edema.     No Known Allergies  Immunization History  Administered Date(s) Administered   Influenza,inj,Quad PF,6+ Mos 03/13/2018   Influenza-Unspecified 04/25/2020   PFIZER(Purple Top)SARS-COV-2 Vaccination 04/04/2020, 04/25/2020   Tdap 08/27/2015, 04/11/2023    Tobacco History: Social History   Tobacco Use  Smoking Status Former   Current packs/day: 0.00   Types: Cigarettes   Quit date: 11/23/2013   Years since quitting: 9.7  Smokeless Tobacco Never   Counseling given: Not Answered   Outpatient Encounter Medications as of 08/26/2023  Medication Sig   dicyclomine (BENTYL) 20 MG tablet TAKE 1 TABLET BY MOUTH TWICE A DAY   naproxen (NAPROSYN) 500 MG tablet TAKE 1 TABLET BY MOUTH 2 TIMES DAILY WITH A MEAL.   sildenafil (VIAGRA) 100 MG tablet Take 1 tablet (100 mg) by mouth daily as needed (for E.D.).   triamcinolone (NASACORT) 55 MCG/ACT AERO nasal inhaler Place 2 sprays into the nose daily. 2 sprays each nostril at night   Vitamin D, Ergocalciferol, (DRISDOL) 1.25 MG (50000 UNIT) CAPS capsule TAKE 1 CAPSULE (50,000 UNITS TOTAL) BY MOUTH ONCE A WEEK. X 12 WEEKS.   [DISCONTINUED] loratadine (CLARITIN) 10 MG tablet Take 1 tablet (10 mg total) by mouth daily. (Patient taking differently: Take 10 mg by mouth daily as needed for  allergies or rhinitis.)   [DISCONTINUED] ondansetron (ZOFRAN) 4 MG tablet Take 1 tablet (4 mg total) by mouth every 6 (six) hours.   [DISCONTINUED] Oxycodone HCl 10 MG TABS Take 1 tablet (10 mg) by mouth every 6 hours as needed.   [DISCONTINUED] promethazine (PHENERGAN) 25 MG tablet Take 1 tablet (25 mg total) by mouth every 6 (six) hours as needed for nausea or vomiting.   bacitracin ointment Apply 1 Application topically 2 (two) times daily. (Patient not taking: Reported on 06/28/2023)   Oxycodone HCl 10 MG TABS Take 1 tablet (10 mg) by mouth every 6 hours as needed.   promethazine (PHENERGAN) 25 MG tablet Take 1 tablet (25 mg total) by mouth every 6 (six) hours as needed for nausea or vomiting.   [DISCONTINUED] amoxicillin-clavulanate (AUGMENTIN) 875-125 MG tablet Take 1 tablet by mouth every 12 (twelve) hours. (Patient not taking: Reported on 08/26/2023)   [DISCONTINUED] ondansetron (ZOFRAN-ODT) 4 MG disintegrating tablet Dissolve 1 tablet (4 mg total) by mouth every 6 (six) hours as needed for nausea or vomiting.   [DISCONTINUED] Oxycodone HCl 10 MG TABS Take 1 tablet (10 mg total) by mouth 4 (four) times daily as needed (For severe pain). (Patient not taking: Reported on 08/26/2023)   [DISCONTINUED] promethazine (PHENERGAN) 25 MG tablet Take 1 tablet (25 mg total) by mouth every 6 (six) hours as needed for nausea or vomiting. (Patient not taking: Reported on 06/28/2023)   No facility-administered encounter medications  on file as of 08/26/2023.    Review of Systems  Review of Systems  Constitutional: Negative.   HENT: Negative.    Cardiovascular: Negative.   Gastrointestinal: Negative.   Allergic/Immunologic: Negative.   Neurological: Negative.   Psychiatric/Behavioral: Negative.       Objective:   BP 130/79   Pulse 66   Temp (!) 97.1 F (36.2 C)   Wt 182 lb (82.6 kg)   SpO2 100%   BMI 27.67 kg/m   Wt Readings from Last 5 Encounters:  08/26/23 182 lb (82.6 kg)  08/17/23 170 lb  (77.1 kg)  08/03/23 170 lb (77.1 kg)  06/28/23 171 lb 15.3 oz (78 kg)  05/26/23 172 lb (78 kg)     Physical Exam Vitals and nursing note reviewed.  Constitutional:      General: He is not in acute distress.    Appearance: He is well-developed.  Cardiovascular:     Rate and Rhythm: Normal rate and regular rhythm.  Pulmonary:     Effort: Pulmonary effort is normal.     Breath sounds: Normal breath sounds.  Skin:    General: Skin is warm and dry.  Neurological:     Mental Status: He is alert and oriented to person, place, and time.       Assessment & Plan:   Sickle cell anemia without crisis (HCC) -     Drug Screen 11 w/Conf, Ser -     Sickle Cell Panel -     oxyCODONE HCl; Take 1 tablet (10 mg) by mouth every 6 hours as needed.  Dispense: 60 tablet; Refill: 0  Erectile dysfunction, unspecified erectile dysfunction type -     Ambulatory referral to Urology  Other orders -     Promethazine HCl; Take 1 tablet (25 mg total) by mouth every 6 (six) hours as needed for nausea or vomiting.  Dispense: 20 tablet; Refill: 0     Return in about 3 months (around 11/23/2023).     Ivonne Andrew, NP 08/26/2023

## 2023-08-29 ENCOUNTER — Other Ambulatory Visit: Payer: Self-pay | Admitting: Nurse Practitioner

## 2023-08-29 ENCOUNTER — Other Ambulatory Visit (HOSPITAL_COMMUNITY): Payer: Self-pay

## 2023-08-29 DIAGNOSIS — E559 Vitamin D deficiency, unspecified: Secondary | ICD-10-CM

## 2023-08-29 MED ORDER — VITAMIN D (ERGOCALCIFEROL) 1.25 MG (50000 UNIT) PO CAPS
50000.0000 [IU] | ORAL_CAPSULE | ORAL | 3 refills | Status: DC
  Filled 2023-08-29: qty 12, 84d supply, fill #0
  Filled 2023-12-06: qty 12, 84d supply, fill #1
  Filled 2024-02-22: qty 12, 84d supply, fill #2
  Filled 2024-05-21: qty 12, 84d supply, fill #3

## 2023-09-05 ENCOUNTER — Other Ambulatory Visit (HOSPITAL_COMMUNITY): Payer: Self-pay

## 2023-09-05 LAB — DRUG SCREEN 11 W/CONF, SE
Amphetamines, IA: NEGATIVE ng/mL
Barbiturates, IA: NEGATIVE ug/mL
Benzodiazepines, IA: NEGATIVE ng/mL
Cocaine & Metabolite, IA: NEGATIVE ng/mL
Ethyl Alcohol, Enz: NEGATIVE g/dL
Methadone, IA: NEGATIVE ng/mL
Opiates, IA: NEGATIVE ng/mL
Oxycodones, IA: NEGATIVE ng/mL
Phencyclidine, IA: NEGATIVE ng/mL
Propoxyphene, IA: NEGATIVE ng/mL
THC(Marijuana) Metabolite, IA: POSITIVE ng/mL — AB

## 2023-09-05 LAB — CMP14+CBC/D/PLT+FER+RETIC+V...
ALT: 21 [IU]/L (ref 0–44)
AST: 9 [IU]/L (ref 0–40)
Albumin: 4.8 g/dL (ref 4.1–5.1)
Alkaline Phosphatase: 68 [IU]/L (ref 44–121)
BUN/Creatinine Ratio: 11 (ref 9–20)
BUN: 11 mg/dL (ref 6–20)
Basophils Absolute: 0 10*3/uL (ref 0.0–0.2)
Basos: 0 %
Bilirubin Total: 1.2 mg/dL (ref 0.0–1.2)
CO2: 26 mmol/L (ref 20–29)
Calcium: 9.9 mg/dL (ref 8.7–10.2)
Chloride: 101 mmol/L (ref 96–106)
Creatinine, Ser: 1.03 mg/dL (ref 0.76–1.27)
EOS (ABSOLUTE): 0.1 10*3/uL (ref 0.0–0.4)
Eos: 1 %
Ferritin: 320 ng/mL (ref 30–400)
Globulin, Total: 2.8 g/dL (ref 1.5–4.5)
Glucose: 87 mg/dL (ref 70–99)
Hematocrit: 44.9 % (ref 37.5–51.0)
Hemoglobin: 14.5 g/dL (ref 13.0–17.7)
Immature Grans (Abs): 0 10*3/uL (ref 0.0–0.1)
Immature Granulocytes: 0 %
Lymphocytes Absolute: 2.8 10*3/uL (ref 0.7–3.1)
Lymphs: 27 %
MCH: 27.9 pg (ref 26.6–33.0)
MCHC: 32.3 g/dL (ref 31.5–35.7)
MCV: 86 fL (ref 79–97)
Monocytes Absolute: 0.8 10*3/uL (ref 0.1–0.9)
Monocytes: 8 %
NRBC: 1 % — ABNORMAL HIGH (ref 0–0)
Neutrophils Absolute: 6.5 10*3/uL (ref 1.4–7.0)
Neutrophils: 64 %
Platelets: 155 10*3/uL (ref 150–450)
Potassium: 4.5 mmol/L (ref 3.5–5.2)
RBC: 5.2 x10E6/uL (ref 4.14–5.80)
RDW: 16.3 % — ABNORMAL HIGH (ref 11.6–15.4)
Retic Ct Pct: 6 % — ABNORMAL HIGH (ref 0.6–2.6)
Sodium: 140 mmol/L (ref 134–144)
Total Protein: 7.6 g/dL (ref 6.0–8.5)
Vit D, 25-Hydroxy: 17.1 ng/mL — ABNORMAL LOW (ref 30.0–100.0)
WBC: 10.3 10*3/uL (ref 3.4–10.8)
eGFR: 98 mL/min/{1.73_m2} (ref >59)

## 2023-09-05 LAB — THC,MS,WB/SP RFX
Cannabidiol: NEGATIVE ng/mL
Cannabinoid Confirmation: POSITIVE
Carboxy-THC: 140.4 ng/mL
Hydroxy-THC: 6.4 ng/mL
Tetrahydrocannabinol(THC): 13.5 ng/mL

## 2023-09-07 ENCOUNTER — Other Ambulatory Visit: Payer: Self-pay | Admitting: Nurse Practitioner

## 2023-09-07 DIAGNOSIS — D571 Sickle-cell disease without crisis: Secondary | ICD-10-CM

## 2023-09-07 MED ORDER — OXYCODONE HCL 10 MG PO TABS
10.0000 mg | ORAL_TABLET | Freq: Four times a day (QID) | ORAL | 0 refills | Status: DC | PRN
  Filled 2023-09-09 (×2): qty 60, 15d supply, fill #0

## 2023-09-08 ENCOUNTER — Other Ambulatory Visit (HOSPITAL_COMMUNITY): Payer: Self-pay

## 2023-09-09 ENCOUNTER — Other Ambulatory Visit (HOSPITAL_COMMUNITY): Payer: Self-pay

## 2023-09-20 ENCOUNTER — Other Ambulatory Visit: Payer: Self-pay | Admitting: Nurse Practitioner

## 2023-09-20 DIAGNOSIS — D571 Sickle-cell disease without crisis: Secondary | ICD-10-CM

## 2023-09-21 MED ORDER — OXYCODONE HCL 10 MG PO TABS
10.0000 mg | ORAL_TABLET | Freq: Four times a day (QID) | ORAL | 0 refills | Status: DC | PRN
  Filled 2023-09-23: qty 60, 15d supply, fill #0

## 2023-09-21 MED ORDER — PROMETHAZINE HCL 25 MG PO TABS
25.0000 mg | ORAL_TABLET | Freq: Four times a day (QID) | ORAL | 0 refills | Status: DC | PRN
  Filled 2023-09-21: qty 20, 5d supply, fill #0

## 2023-09-22 ENCOUNTER — Other Ambulatory Visit (HOSPITAL_COMMUNITY): Payer: Self-pay

## 2023-09-23 ENCOUNTER — Other Ambulatory Visit (HOSPITAL_COMMUNITY): Payer: Self-pay

## 2023-09-26 ENCOUNTER — Ambulatory Visit: Payer: Self-pay | Admitting: Nurse Practitioner

## 2023-09-26 ENCOUNTER — Encounter: Payer: Self-pay | Admitting: Nurse Practitioner

## 2023-09-26 NOTE — Progress Notes (Signed)
Error

## 2023-10-04 ENCOUNTER — Other Ambulatory Visit: Payer: Self-pay | Admitting: Nurse Practitioner

## 2023-10-04 DIAGNOSIS — D571 Sickle-cell disease without crisis: Secondary | ICD-10-CM

## 2023-10-05 ENCOUNTER — Other Ambulatory Visit (HOSPITAL_COMMUNITY): Payer: Self-pay

## 2023-10-05 MED ORDER — OXYCODONE HCL 10 MG PO TABS
10.0000 mg | ORAL_TABLET | Freq: Four times a day (QID) | ORAL | 0 refills | Status: DC | PRN
  Filled 2023-10-07: qty 60, 15d supply, fill #0

## 2023-10-05 MED ORDER — PROMETHAZINE HCL 25 MG PO TABS
25.0000 mg | ORAL_TABLET | Freq: Four times a day (QID) | ORAL | 0 refills | Status: DC | PRN
  Filled 2023-10-05: qty 20, 5d supply, fill #0

## 2023-10-07 ENCOUNTER — Other Ambulatory Visit (HOSPITAL_COMMUNITY): Payer: Self-pay

## 2023-10-08 ENCOUNTER — Other Ambulatory Visit: Payer: Self-pay

## 2023-10-08 ENCOUNTER — Encounter (HOSPITAL_COMMUNITY): Payer: Self-pay

## 2023-10-08 ENCOUNTER — Emergency Department (HOSPITAL_COMMUNITY)
Admission: EM | Admit: 2023-10-08 | Discharge: 2023-10-08 | Disposition: A | Discharge status: Home / Self Care | Attending: Emergency Medicine | Admitting: Emergency Medicine

## 2023-10-08 DIAGNOSIS — D72829 Elevated white blood cell count, unspecified: Secondary | ICD-10-CM | POA: Diagnosis not present

## 2023-10-08 DIAGNOSIS — M545 Low back pain, unspecified: Secondary | ICD-10-CM | POA: Diagnosis not present

## 2023-10-08 DIAGNOSIS — D5700 Hb-SS disease with crisis, unspecified: Secondary | ICD-10-CM | POA: Insufficient documentation

## 2023-10-08 LAB — CBC WITH DIFFERENTIAL/PLATELET
Abs Immature Granulocytes: 0.04 10*3/uL (ref 0.00–0.07)
Basophils Absolute: 0 10*3/uL (ref 0.0–0.1)
Basophils Relative: 0 %
Eosinophils Absolute: 0.1 10*3/uL (ref 0.0–0.5)
Eosinophils Relative: 1 %
HCT: 37.6 % — ABNORMAL LOW (ref 39.0–52.0)
Hemoglobin: 13.5 g/dL (ref 13.0–17.0)
Immature Granulocytes: 0 %
Lymphocytes Relative: 30 %
Lymphs Abs: 3.6 10*3/uL (ref 0.7–4.0)
MCH: 28.5 pg (ref 26.0–34.0)
MCHC: 35.9 g/dL (ref 30.0–36.0)
MCV: 79.5 fL — ABNORMAL LOW (ref 80.0–100.0)
Monocytes Absolute: 0.9 10*3/uL (ref 0.1–1.0)
Monocytes Relative: 8 %
Neutro Abs: 7.3 10*3/uL (ref 1.7–7.7)
Neutrophils Relative %: 61 %
Platelets: 154 10*3/uL (ref 150–400)
RBC: 4.73 MIL/uL (ref 4.22–5.81)
RDW: 14.5 % (ref 11.5–15.5)
WBC: 11.9 10*3/uL — ABNORMAL HIGH (ref 4.0–10.5)
nRBC: 0.4 % — ABNORMAL HIGH (ref 0.0–0.2)

## 2023-10-08 LAB — COMPREHENSIVE METABOLIC PANEL
ALT: 16 U/L (ref 0–44)
AST: 11 U/L — ABNORMAL LOW (ref 15–41)
Albumin: 4.4 g/dL (ref 3.5–5.0)
Alkaline Phosphatase: 51 U/L (ref 38–126)
Anion gap: 9 (ref 5–15)
BUN: 11 mg/dL (ref 6–20)
CO2: 28 mmol/L (ref 22–32)
Calcium: 9.4 mg/dL (ref 8.9–10.3)
Chloride: 101 mmol/L (ref 98–111)
Creatinine, Ser: 0.95 mg/dL (ref 0.61–1.24)
GFR, Estimated: >60 mL/min (ref >60)
Glucose, Bld: 110 mg/dL — ABNORMAL HIGH (ref 70–99)
Potassium: 3.6 mmol/L (ref 3.5–5.1)
Sodium: 138 mmol/L (ref 135–145)
Total Bilirubin: 1.5 mg/dL — ABNORMAL HIGH (ref 0.0–1.2)
Total Protein: 7.7 g/dL (ref 6.5–8.1)

## 2023-10-08 LAB — RETICULOCYTES
Immature Retic Fract: 28 % — ABNORMAL HIGH (ref 2.3–15.9)
RBC.: 4.61 MIL/uL (ref 4.22–5.81)
Retic Count, Absolute: 193.2 10*3/uL — ABNORMAL HIGH (ref 19.0–186.0)
Retic Ct Pct: 4.2 % — ABNORMAL HIGH (ref 0.4–3.1)

## 2023-10-08 MED ORDER — HYDROMORPHONE HCL 1 MG/ML IJ SOLN
2.0000 mg | INTRAMUSCULAR | Status: AC
  Administered 2023-10-08: 2 mg via INTRAVENOUS
  Filled 2023-10-08: qty 2

## 2023-10-08 MED ORDER — KETOROLAC TROMETHAMINE 15 MG/ML IJ SOLN
15.0000 mg | INTRAMUSCULAR | Status: AC
  Administered 2023-10-08: 15 mg via INTRAVENOUS
  Filled 2023-10-08: qty 1

## 2023-10-08 MED ORDER — ONDANSETRON HCL 4 MG/2ML IJ SOLN
4.0000 mg | INTRAMUSCULAR | Status: DC | PRN
  Administered 2023-10-08: 4 mg via INTRAVENOUS
  Filled 2023-10-08: qty 2

## 2023-10-08 MED ORDER — HYDROMORPHONE HCL 1 MG/ML IJ SOLN: 2.0000 mg | INTRAMUSCULAR | Status: DC

## 2023-10-08 NOTE — ED Provider Notes (Signed)
 [place] EMERGENCY DEPARTMENT AT [facility] Provider Note   CSN: [id number] Arrival date & time: 10/08/23  1758     History  Chief Complaint  Patient presents with   Sickle Cell Pain Crisis    Austin [name] is a 33 y.o. male.   Sickle Cell Pain Crisis Patient is a 33 year old male presents the ED today complaining of back pain worried of sickle cell crisis that began this a.m.  He is on chronic pain medication prescribed to take 10 mg of oxycodone 4 times a day.  Recently seen by the sickle cell clinic on 08/26/2023.  States that he has only taken 5 mg oxycodone once this morning and 5 mg this afternoon without relief. Endorses mild nausea Denies fever, headache, vision changes, chest pain, shortness of breath, abdominal pain, lower extremity pain, dysuria, nausea, vomiting, diarrhea, lower extremity swelling.     Home Medications Prior to Admission medications   Medication Sig Start Date End Date Taking? Authorizing Provider  bacitracin ointment Apply 1 Application topically 2 (two) times daily. Patient not taking: Reported on 06/28/2023 04/11/23   Henderly, Britni A, PA-C  dicyclomine (BENTYL) 20 MG tablet TAKE 1 TABLET BY MOUTH TWICE A DAY 03/25/22   Jegede, Olugbemiga E, MD  naproxen (NAPROSYN) 500 MG tablet TAKE 1 TABLET BY MOUTH 2 TIMES DAILY WITH A MEAL. 11/10/22   Ivonne Andrew, NP  Oxycodone HCl 10 MG TABS Take 1 tablet (10 mg) by mouth every 6 hours as needed. 10/07/23   Ivonne Andrew, NP  promethazine (PHENERGAN) 25 MG tablet Take 1 tablet (25 mg total) by mouth every 6 (six) hours as needed for nausea or vomiting. 10/05/23   Ivonne Andrew, NP  sildenafil (VIAGRA) 100 MG tablet Take 1 tablet (100 mg) by mouth daily as needed (for E.D.). 07/25/23   Quentin Angst, MD  triamcinolone (NASACORT) 55 MCG/ACT AERO nasal inhaler Place 2 sprays into the nose daily. 2 sprays each nostril at night 04/27/21   Drema Halon, MD  Vitamin D,  Ergocalciferol, (DRISDOL) 1.25 MG (50000 UNIT) CAPS capsule Take 1 capsule (50,000 Units total) by mouth every 7 (seven) days. 08/29/23   Ivonne Andrew, NP      Allergies    Patient has no known allergies.    Review of Systems   Review of Systems  Musculoskeletal:  Positive for back pain.  All other systems reviewed and are negative.   Physical Exam Updated Vital Signs BP 120/67   Pulse 88   Temp 98.1 F (36.7 C)   Resp 18   Ht 5\' 8"  (1.727 m)   Wt 85.3 kg   SpO2 99%   BMI 28.59 kg/m  Physical Exam Vitals and nursing note reviewed.  Constitutional:      General: He is not in acute distress.    Appearance: Normal appearance. He is not ill-appearing.  HENT:     Head: Normocephalic and atraumatic.  Eyes:     General: No scleral icterus.       Right eye: No discharge.        Left eye: No discharge.     Extraocular Movements: Extraocular movements intact.     Conjunctiva/sclera: Conjunctivae normal.  Cardiovascular:     Rate and Rhythm: Normal rate and regular rhythm.     Pulses: Normal pulses.     Heart sounds: Normal heart sounds. No murmur heard.    No friction rub. No gallop.  Pulmonary:  Effort: Pulmonary effort is normal. No respiratory distress.     Breath sounds: Normal breath sounds. No stridor. No wheezing, rhonchi or rales.  Abdominal:     General: Abdomen is flat. There is no distension.     Palpations: Abdomen is soft.     Tenderness: There is no abdominal tenderness. There is no right CVA tenderness, left CVA tenderness or guarding.  Musculoskeletal:        General: Tenderness (Mild tenderness to palpation low across entire lower back.) present. No swelling or deformity.     Right lower leg: No edema.     Left lower leg: No edema.  Skin:    General: Skin is warm and dry.     Coloration: Skin is not pale.     Findings: No bruising or erythema.  Neurological:     General: No focal deficit present.     Mental Status: He is alert and oriented to  person, place, and time. Mental status is at baseline.     Sensory: No sensory deficit.     Motor: No weakness.     Gait: Gait normal.  Psychiatric:        Mood and Affect: Mood normal.     ED Results / Procedures / Treatments   Labs (all labs ordered are listed, but only abnormal results are displayed) Labs Reviewed  CBC WITH DIFFERENTIAL/PLATELET - Abnormal; Notable for the following components:      Result Value   WBC 11.9 (*)    HCT 37.6 (*)    MCV 79.5 (*)    nRBC 0.4 (*)    All other components within normal limits  RETICULOCYTES - Abnormal; Notable for the following components:   Retic Ct Pct 4.2 (*)    Retic Count, Absolute 193.2 (*)    Immature Retic Fract 28.0 (*)    All other components within normal limits  COMPREHENSIVE METABOLIC PANEL - Abnormal; Notable for the following components:   Glucose, Bld 110 (*)    AST 11 (*)    Total Bilirubin 1.5 (*)    All other components within normal limits    EKG None  Radiology No results found.  Procedures Procedures    Medications Ordered in ED Medications  ondansetron (ZOFRAN) injection 4 mg (4 mg Intravenous Given 10/08/23 1852)  HYDROmorphone (DILAUDID) injection 2 mg (2 mg Intravenous Given 10/08/23 1852)  HYDROmorphone (DILAUDID) injection 2 mg (2 mg Intravenous Given 10/08/23 1925)  ketorolac (TORADOL) 15 MG/ML injection 15 mg (15 mg Intravenous Given 10/08/23 1852)    ED Course/ Medical Decision Making/ A&P                                 Medical Decision Making  Patient is a 33 year old male presents the ED today complaining of back pain worried of sickle cell crisis that began this a.m.  He is on chronic pain medication prescribed to take 10 mg of oxycodone 4 times a day.  Recently seen by the sickle cell clinic on 08/26/2023.  States that he has only taken 5 mg oxycodone once this morning and 5 mg this afternoon without relief.  On physical exam, patient was noted to have some mild low back pain across  paraspinal muscles and spine.  However is able to speak in full sentences, provide extraneous stories, alert noted x 4, no acute distress, not hypoxic.  Exam is otherwise unremarkable.  This appears to  be similar to previous sickle cell crises.  With patient currently looking to be in no acute distress. Provided Dilaudid and Toradol with Zofran. On reevaluation, patient is noted to say that his symptoms have improved reporting the pain is "going down."  He is also requesting to know when the next round of medication can be provided.  Anticipate discharge. After second dose of Dilaudid, patient states that he is able to manage pain at home taking his prescribed pain medication.  Low suspicion for any other emergent pathology present at this time as labs seem to consistent with previous episodes.  I believe patient safe to be discharged at this time and have pain managed outpatient with home medications.  Provided strict return to ED precautions.  Patient expressed agreement understanding of plan.  Patient vital signs remained stable to course of his time here.  Differential diagnoses prior to evaluation: The emergent differential diagnosis includes, but is not limited to, avascular necrosis, ACS, sickle cell crisis, bone infarction, infection, CVA.. This is not an exhaustive differential.   Past Medical History / Co-morbidities / Social History: Thrombocytopenia, splenomegaly, sickle cell anemia, chronic pain syndrome, leukocytosis  Additional history: Chart reviewed. Pertinent results include: Notably seen by the sickle cell clinic on 08/26/2023 where he is prescribed 10 mg of oxycodone to be used every 6 hours as needed dispense 60 tablets at that time.  Seen previously in the ED on 08/17/2023 where he was noted to have had pain controlled with similar symptoms with 3 doses of Dilaudid and 1 dose of Toradol.  Lab Tests/Imaging studies: I personally interpreted labs/imaging and the pertinent results  include:   CBC does note some mild leukocytosis Reticulocyte is noted to be elevated but less than previous crises in the past. C MP does show mildly elevated bilirubin of 1.5 but is otherwise unremarkable.  Similar to previous labs.  Cardiac monitoring: EKG obtained and interpreted by myself and attending physician which shows: Sinus rhythm   Medications: I ordered medication including Dilaudid, Toradol, Zofran.  I have reviewed the patients home medicines and have made adjustments as needed.   Disposition: After consideration of the diagnostic results and the patients response to treatment, I feel that the patient benefit from discharge and treatment as above.   emergency department workup does not suggest an emergent condition requiring admission or immediate intervention beyond what has been performed at this time. The plan is: Pain management at home, return for any new or worsening symptoms. The patient is safe for discharge and has been instructed to return immediately for worsening symptoms, change in symptoms or any other concerns.  Final Clinical Impression(s) / ED Diagnoses Final diagnoses:  Sickle cell pain crisis [facility])    Rx / DC Orders ED Discharge Orders     None         Lavonia Drafts 10/08/23 1948    Gerhard Munch, MD 10/08/23 2314

## 2023-10-08 NOTE — Discharge Instructions (Signed)
 You were seen today for sickle cell pain crisis.  With your pain control currently I believe that you are safe to discharge at this time and managing with pain medication at home.  Be sure to take your fully prescribed pain medication as directed.  Return to ED for any new or worsening symptoms.

## 2023-10-08 NOTE — ED Triage Notes (Signed)
 Pt reports sickle cell crisis x2 days. Pt reports 10/10 back pain. Has tried home oxy with little relief.

## 2023-10-18 ENCOUNTER — Other Ambulatory Visit: Payer: Self-pay | Admitting: Nurse Practitioner

## 2023-10-18 ENCOUNTER — Other Ambulatory Visit (HOSPITAL_COMMUNITY): Payer: Self-pay

## 2023-10-18 DIAGNOSIS — D571 Sickle-cell disease without crisis: Secondary | ICD-10-CM

## 2023-10-18 DIAGNOSIS — R110 Nausea: Secondary | ICD-10-CM

## 2023-10-18 MED ORDER — OXYCODONE HCL 10 MG PO TABS
10.0000 mg | ORAL_TABLET | Freq: Four times a day (QID) | ORAL | 0 refills | Status: DC | PRN
Start: 2023-10-22 — End: 2023-11-02
  Filled 2023-10-22: qty 60, 15d supply, fill #0

## 2023-10-18 MED ORDER — PROMETHAZINE HCL 25 MG PO TABS
25.0000 mg | ORAL_TABLET | Freq: Four times a day (QID) | ORAL | 0 refills | Status: DC | PRN
  Filled 2023-10-18: qty 20, 5d supply, fill #0

## 2023-10-18 NOTE — Progress Notes (Signed)
 Reviewed PDMP substance reporting system prior to prescribing opiate medications. No inconsistencies noted.   1. Sickle cell anemia without crisis (HCC)  - Oxycodone HCl 10 MG TABS; Take 1 tablet (10 mg total) by mouth every 6 (six) hours as needed.  Dispense: 60 tablet; Refill: 0

## 2023-10-20 ENCOUNTER — Other Ambulatory Visit: Payer: Self-pay

## 2023-10-20 ENCOUNTER — Encounter (HOSPITAL_COMMUNITY): Payer: Self-pay

## 2023-10-20 ENCOUNTER — Emergency Department (HOSPITAL_COMMUNITY)
Admission: EM | Admit: 2023-10-20 | Discharge: 2023-10-20 | Disposition: A | Discharge status: Home / Self Care | Attending: Emergency Medicine | Admitting: Emergency Medicine

## 2023-10-20 DIAGNOSIS — D5700 Hb-SS disease with crisis, unspecified: Secondary | ICD-10-CM | POA: Diagnosis present

## 2023-10-20 DIAGNOSIS — M545 Low back pain, unspecified: Secondary | ICD-10-CM | POA: Diagnosis not present

## 2023-10-20 DIAGNOSIS — M549 Dorsalgia, unspecified: Secondary | ICD-10-CM

## 2023-10-20 LAB — CBC WITH DIFFERENTIAL/PLATELET
Abs Immature Granulocytes: 0.03 10*3/uL (ref 0.00–0.07)
Basophils Absolute: 0 10*3/uL (ref 0.0–0.1)
Basophils Relative: 0 %
Eosinophils Absolute: 0.1 10*3/uL (ref 0.0–0.5)
Eosinophils Relative: 1 %
HCT: 37.5 % — ABNORMAL LOW (ref 39.0–52.0)
Hemoglobin: 13.5 g/dL (ref 13.0–17.0)
Immature Granulocytes: 0 %
Lymphocytes Relative: 23 %
Lymphs Abs: 2.1 10*3/uL (ref 0.7–4.0)
MCH: 29.5 pg (ref 26.0–34.0)
MCHC: 36 g/dL (ref 30.0–36.0)
MCV: 82.1 fL (ref 80.0–100.0)
Monocytes Absolute: 1.1 10*3/uL — ABNORMAL HIGH (ref 0.1–1.0)
Monocytes Relative: 12 %
Neutro Abs: 5.7 10*3/uL (ref 1.7–7.7)
Neutrophils Relative %: 64 %
Platelets: 105 10*3/uL — ABNORMAL LOW (ref 150–400)
RBC: 4.57 MIL/uL (ref 4.22–5.81)
RDW: 14.6 % (ref 11.5–15.5)
Smear Review: NORMAL
WBC: 9 10*3/uL (ref 4.0–10.5)
nRBC: 0.6 % — ABNORMAL HIGH (ref 0.0–0.2)

## 2023-10-20 LAB — RETICULOCYTES
Immature Retic Fract: 38.1 % — ABNORMAL HIGH (ref 2.3–15.9)
RBC.: 4.61 MIL/uL (ref 4.22–5.81)
Retic Count, Absolute: 226.4 10*3/uL — ABNORMAL HIGH (ref 19.0–186.0)
Retic Ct Pct: 4.9 % — ABNORMAL HIGH (ref 0.4–3.1)

## 2023-10-20 LAB — BASIC METABOLIC PANEL WITH GFR
Anion gap: 8 (ref 5–15)
BUN: 9 mg/dL (ref 6–20)
CO2: 25 mmol/L (ref 22–32)
Calcium: 8.9 mg/dL (ref 8.9–10.3)
Chloride: 103 mmol/L (ref 98–111)
Creatinine, Ser: 0.93 mg/dL (ref 0.61–1.24)
GFR, Estimated: >60 mL/min (ref >60)
Glucose, Bld: 87 mg/dL (ref 70–99)
Potassium: 4.3 mmol/L (ref 3.5–5.1)
Sodium: 136 mmol/L (ref 135–145)

## 2023-10-20 MED ORDER — KETOROLAC TROMETHAMINE 15 MG/ML IJ SOLN
15.0000 mg | INTRAMUSCULAR | Status: AC
  Administered 2023-10-20: 15 mg via INTRAVENOUS
  Filled 2023-10-20: qty 1

## 2023-10-20 MED ORDER — HYDROMORPHONE HCL 1 MG/ML IJ SOLN: 2.0000 mg | INTRAMUSCULAR | Status: DC

## 2023-10-20 MED ORDER — ONDANSETRON HCL 4 MG/2ML IJ SOLN
4.0000 mg | INTRAMUSCULAR | Status: DC | PRN
  Administered 2023-10-20: 4 mg via INTRAVENOUS
  Filled 2023-10-20: qty 2

## 2023-10-20 MED ORDER — HYDROMORPHONE HCL 1 MG/ML IJ SOLN
2.0000 mg | Freq: Once | INTRAMUSCULAR | Status: AC
  Administered 2023-10-20: 2 mg via SUBCUTANEOUS
  Filled 2023-10-20: qty 2

## 2023-10-20 MED ORDER — HYDROMORPHONE HCL 1 MG/ML IJ SOLN
2.0000 mg | INTRAMUSCULAR | Status: AC
  Administered 2023-10-20: 2 mg via SUBCUTANEOUS
  Filled 2023-10-20: qty 2

## 2023-10-20 MED ORDER — OXYCODONE HCL 5 MG PO TABS
10.0000 mg | ORAL_TABLET | Freq: Once | ORAL | Status: AC
  Administered 2023-10-20: 10 mg via ORAL
  Filled 2023-10-20: qty 2

## 2023-10-20 NOTE — ED Provider Notes (Signed)
 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  States the pain is located in the right upper and right mid back.  Started yesterday.  States he has run out of his home medications.  States he normally takes oxycodone 10 mg as needed but in the last week has had to take it every 6 hours as prescribed.  He denies any radiation of the pain into his chest or to the midline.  Denies cough.  States the pain is very similar to crises he has had in the past.   Sickle Cell Pain Crisis      Home Medications Prior to Admission medications   Medication Sig Start Date End Date Taking? Authorizing Provider  bacitracin ointment Apply 1 Application topically 2 (two) times daily. Patient not taking: Reported on 06/28/2023 04/11/23   Henderly, Britni A, PA-C  dicyclomine (BENTYL) 20 MG tablet TAKE 1 TABLET BY MOUTH TWICE A DAY 03/25/22   Jegede, Olugbemiga E, MD  naproxen (NAPROSYN) 500 MG tablet TAKE 1 TABLET BY MOUTH 2 TIMES DAILY WITH A MEAL. 11/10/22   Ivonne Andrew, NP  Oxycodone HCl 10 MG TABS Take 1 tablet (10 mg total) by mouth every 6 (six) hours as needed. 10/22/23   Donell Beers, FNP  promethazine (PHENERGAN) 25 MG tablet Take 1 tablet (25 mg total) by mouth every 6 (six) hours as needed for nausea or vomiting. 10/18/23   Paseda, Baird Kay, FNP  sildenafil (VIAGRA) 100 MG tablet Take 1 tablet (100 mg) by mouth daily as needed (for E.D.). 07/25/23   Quentin Angst, MD  triamcinolone (NASACORT) 55 MCG/ACT AERO nasal inhaler Place 2 sprays into the nose daily. 2 sprays each nostril at night 04/27/21   Drema Halon, MD  Vitamin D, Ergocalciferol, (DRISDOL) 1.25 MG (50000 UNIT) CAPS capsule Take 1 capsule (50,000 Units total) by mouth  every 7 (seven) days. 08/29/23   Ivonne Andrew, NP      Allergies    Patient has no known allergies.    Review of Systems   See HPI  Physical Exam Updated Vital Signs BP 98/63   Pulse 80   Temp 97.7 F (36.5 C)   Resp 12   Ht 5\' 8"  (1.727 m)   Wt 86 kg   SpO2 98%   BMI 28.83 kg/m  Physical Exam Vitals and nursing note reviewed.  HENT:     Head: Normocephalic and atraumatic.     Mouth/Throat:     Mouth: Mucous membranes are moist.  Eyes:     General:        Right eye: No discharge.        Left eye: No discharge.     Conjunctiva/sclera: Conjunctivae normal.  Cardiovascular:     Rate and Rhythm: Normal rate and regular rhythm.     Pulses: Normal pulses.     Heart sounds: Normal heart sounds.  Pulmonary:     Effort: Pulmonary effort is normal.     Breath sounds: Normal breath sounds.  Abdominal:     General: Abdomen is flat.     Palpations: Abdomen is soft.  Skin:    General: Skin is warm and dry.  Neurological:  General: No focal deficit present.  Psychiatric:        Mood and Affect: Mood normal.     ED Results / Procedures / Treatments   Labs (all labs ordered are listed, but only abnormal results are displayed) Labs Reviewed  CBC WITH DIFFERENTIAL/PLATELET - Abnormal; Notable for the following components:      Result Value   HCT 37.5 (*)    Platelets 105 (*)    nRBC 0.6 (*)    Monocytes Absolute 1.1 (*)    All other components within normal limits  RETICULOCYTES - Abnormal; Notable for the following components:   Retic Ct Pct 4.9 (*)    Retic Count, Absolute 226.4 (*)    Immature Retic Fract 38.1 (*)    All other components within normal limits  BASIC METABOLIC PANEL WITH GFR    EKG None  Radiology No results found.  Procedures Procedures    Medications Ordered in ED Medications  ondansetron (ZOFRAN) injection 4 mg (4 mg Intravenous Given 10/20/23 0840)  oxyCODONE (Oxy IR/ROXICODONE) immediate release tablet 10 mg (has no  administration in time range)  ketorolac (TORADOL) 15 MG/ML injection 15 mg (15 mg Intravenous Given 10/20/23 0840)  HYDROmorphone (DILAUDID) injection 2 mg (2 mg Subcutaneous Given 10/20/23 0841)  HYDROmorphone (DILAUDID) injection 2 mg (2 mg Subcutaneous Given 10/20/23 1032)    ED Course/ Medical Decision Making/ A&P                                 Medical Decision Making Amount and/or Complexity of Data Reviewed Labs: ordered.  Risk Prescription drug management.   33 year old well-appearing male presenting for pain.  Per further review of his chart, discovered that he has a history of hemoglobin CC and not sickle cell.  Also per [facility] evaluation, he refilled his medication March 15 was given 60 tablets of oxycodone 10s.  Discussed with patient and he stated that he has requested a refill and that he will be able to acquire the refill tomorrow.  Exam here was unremarkable.  Pain was well-controlled after treatment.  Advised him to follow-up with his medical provider that sees him for his blood abnormality.  Discussed return precautions.  Discharged in good condition.        Final Clinical Impression(s) / ED Diagnoses Final diagnoses:  Back pain, unspecified back location, unspecified back pain laterality, unspecified chronicity    Rx / DC Orders ED Discharge Orders     None         Gareth Eagle, PA-C 10/20/23 1047    Benjiman Core, MD 10/20/23 1453

## 2023-10-20 NOTE — Discharge Instructions (Signed)
 Evaluation today was overall reassuring.  Please follow-up with your medical provider who refills here pain medications.  If you have any chest pain, shortness of breath, pain in your joints or any other concerning symptom please return emergency department further evaluation.

## 2023-10-20 NOTE — ED Triage Notes (Signed)
 Patient has had a sickle cell pain crisis since yesterday. Complaining of upper and lower back pain. Ran out of his medication.

## 2023-10-21 ENCOUNTER — Other Ambulatory Visit (HOSPITAL_COMMUNITY): Payer: Self-pay

## 2023-10-22 ENCOUNTER — Other Ambulatory Visit (HOSPITAL_COMMUNITY): Payer: Self-pay

## 2023-11-02 ENCOUNTER — Other Ambulatory Visit (HOSPITAL_COMMUNITY): Payer: Self-pay

## 2023-11-02 ENCOUNTER — Other Ambulatory Visit: Payer: Self-pay | Admitting: Nurse Practitioner

## 2023-11-02 DIAGNOSIS — D571 Sickle-cell disease without crisis: Secondary | ICD-10-CM

## 2023-11-02 DIAGNOSIS — R110 Nausea: Secondary | ICD-10-CM

## 2023-11-02 MED ORDER — OXYCODONE HCL 10 MG PO TABS
10.0000 mg | ORAL_TABLET | Freq: Four times a day (QID) | ORAL | 0 refills | Status: DC | PRN
  Filled 2023-11-05 (×2): qty 60, 15d supply, fill #0

## 2023-11-02 MED ORDER — PROMETHAZINE HCL 25 MG PO TABS
25.0000 mg | ORAL_TABLET | Freq: Four times a day (QID) | ORAL | 0 refills | Status: DC | PRN
  Filled 2023-11-02: qty 20, 5d supply, fill #0

## 2023-11-05 ENCOUNTER — Other Ambulatory Visit (HOSPITAL_COMMUNITY): Payer: Self-pay

## 2023-11-15 NOTE — H&P (Signed)
  Patient: Austin [name]  PID: [id number]  DOB: Jun 28, 1991  SEX: Male   Patient referred by  DDS for extraction teeth 19, 31.   CC: Pain lower right and lower left back teeth.  Past Medical History:  Sickle cell disease    Medications: Oxycodone , Penicillin    Allergies:     NKDA    Surgeries:   Oral Surgery, Foot surgery     Social History       Smoking: n           Alcohol:n Drug use:n                             Exam: BMI 28. Large caries teeth # 19, 31.   No purulence, edema, fluctuance, trismus. Oral cancer screening negative. Pharynx clear. No lymphadenopathy.  PAXR:Large caries teeth # 19, 31.  Assessment: ASA 3. Non-restorable  teeth # 19, 31.             Plan: Extraction Teeth # 19, 31.  Hospital Day surgery.                 Rx: n               Risks and complications explained. Questions answered.   Cornelia Dieter, DMD

## 2023-11-16 ENCOUNTER — Other Ambulatory Visit: Payer: Self-pay

## 2023-11-16 ENCOUNTER — Encounter (HOSPITAL_COMMUNITY): Payer: Self-pay | Admitting: Oral Surgery

## 2023-11-16 NOTE — Progress Notes (Signed)
 Anesthesia Chart Review: Same day workup  33 year old male with pertinent history including frequent admissions for reported sickle cell crisis.  However, review of hematology notes from 2019 indicates patient does not have sickle cell, rather, he has hemoglobin C disease.  Hemoglobinopathy evaluation 08/12/2017 was negative for hemoglobin S (0%), positive for hemoglobin C (94%).  He is maintained on oxycodone  10 mg every 6 hours as needed for chronic pain.  BMP and CBC from 10/20/2023 reviewed, mild thrombocytopenia with platelets 105, otherwise unremarkable.  Pt will need DOS evaluation.   EKG 10/08/2023: Sinus rhythm. Rate 86. No significant change since last tracing.    Edilia Gordon [place] [facility]/Anesthesiology Phone [phone] 11/16/2023 11:02 AM

## 2023-11-16 NOTE — Anesthesia Preprocedure Evaluation (Signed)
 Anesthesia Evaluation  Patient identified by MRN, date of birth, ID band Patient awake    Reviewed: Allergy & Precautions, H&P , NPO status , Patient's Chart, lab work & pertinent test results  Airway Mallampati: II  TM Distance: >3 FB Neck ROM: Full    Dental  (+) Teeth Intact, Dental Advisory Given, Chipped   Pulmonary neg pulmonary ROS, former smoker   Pulmonary exam normal breath sounds clear to auscultation       Cardiovascular negative cardio ROS Normal cardiovascular exam Rhythm:Regular Rate:Normal     Neuro/Psych negative neurological ROS  negative psych ROS   GI/Hepatic negative GI ROS,,,(+)     substance abuse  marijuana use  Endo/Other  negative endocrine ROS    Renal/GU negative Renal ROS  negative genitourinary   Musculoskeletal negative musculoskeletal ROS (+)    Abdominal   Peds negative pediatric ROS (+)  Hematology  (+) Blood dyscrasia, Sickle cell anemia Oxy 1-2 per day for sickle cell, primary pain in back and legs   Anesthesia Other Findings   Reproductive/Obstetrics negative OB ROS                             Anesthesia Physical Anesthesia Plan  ASA: 2  Anesthesia Plan: General   Post-op Pain Management: Tylenol  PO (pre-op)*   Induction: Intravenous  PONV Risk Score and Plan: 2 and Ondansetron , Dexamethasone , Midazolam  and Treatment may vary due to age or medical condition  Airway Management Planned: Nasal ETT  Additional Equipment: None  Intra-op Plan:   Post-operative Plan: Extubation in OR  Informed Consent: I have reviewed the patients History and Physical, chart, labs and discussed the procedure including the risks, benefits and alternatives for the proposed anesthesia with the patient or authorized representative who has indicated his/her understanding and acceptance.     Dental advisory given  Plan Discussed with: CRNA  Anesthesia Plan  Comments: (PAT note by Rudy Costain, PA-C: 33 year old male with pertinent history including frequent admissions for reported sickle cell crisis.  However, review of hematology notes from 2019 indicates patient does not have sickle cell, rather, he has hemoglobin C disease.  Hemoglobinopathy evaluation 08/12/2017 was negative for hemoglobin S (0%), positive for hemoglobin C (94%).  He is maintained on oxycodone  10 mg every 6 hours as needed for chronic pain.  BMP and CBC from 10/20/2023 reviewed, mild thrombocytopenia with platelets 105, otherwise unremarkable.  Pt will need DOS evaluation.   EKG 10/08/2023: Sinus rhythm. Rate 86. No significant change since last tracing.  )        Anesthesia Quick Evaluation

## 2023-11-16 NOTE — Progress Notes (Signed)
 SDW call  Patient was given pre-op instructions over the phone. Patient verbalized understanding of instructions provided.     PCP - Abbey Hobby, NP Cardiologist -  Pulmonary:    PPM/ICD - denies Device Orders -  Rep Notified -    Chest x-ray - 10/08/2023 EKG -  06/28/2023 Stress Test - ECHO -  Cardiac Cath -   Sleep Study/sleep apnea/CPAP: denies  Non-diabetic  Blood Thinner Instructions: denies Aspirin Instructions:denies   ERAS Protcol - NPO  COVID TEST- na    Anesthesia review: Sickle cell anemai   Patient denies shortness of breath, fever, cough and chest pain over the phone call  Your procedure is scheduled on Thursday November 17, 2023  Report to [facility] Main Entrance "A" at  1100  A.M., then check in with the Admitting office.  Call this number if you have problems the morning of surgery:  [phone]   If you have any questions prior to your surgery date call [phone]: Open Monday-Friday 8am-4pm If you experience any cold or flu symptoms such as cough, fever, chills, shortness of breath, etc. between now and your scheduled surgery, please notify us  at the above number     Remember:  Do not eat or drink after midnight the night before your surgery  Take these medicines if needed the morning of surgery with A SIP OF WATER:  Oxycodone , phenergan   As of today, STOP taking any Aspirin (unless otherwise instructed by your surgeon) Aleve , Naproxen , Ibuprofen , Motrin , Advil , Goody's, BC's, all herbal medications, fish oil, and all vitamins.

## 2023-11-17 ENCOUNTER — Other Ambulatory Visit: Payer: Self-pay

## 2023-11-17 ENCOUNTER — Encounter (HOSPITAL_COMMUNITY)
Admission: RE | Disposition: A | Discharge status: Home / Self Care | Payer: Self-pay | Source: Home / Self Care | Attending: Oral Surgery

## 2023-11-17 ENCOUNTER — Ambulatory Visit (HOSPITAL_BASED_OUTPATIENT_CLINIC_OR_DEPARTMENT_OTHER): Payer: Self-pay | Admitting: Physician Assistant

## 2023-11-17 ENCOUNTER — Other Ambulatory Visit (HOSPITAL_COMMUNITY): Payer: Self-pay

## 2023-11-17 ENCOUNTER — Encounter (HOSPITAL_COMMUNITY): Payer: Self-pay | Admitting: Oral Surgery

## 2023-11-17 ENCOUNTER — Ambulatory Visit (HOSPITAL_COMMUNITY)
Admission: RE | Admit: 2023-11-17 | Discharge: 2023-11-17 | Disposition: A | Discharge status: Home / Self Care | Attending: Oral Surgery | Admitting: Oral Surgery

## 2023-11-17 ENCOUNTER — Ambulatory Visit (HOSPITAL_COMMUNITY): Payer: Self-pay | Admitting: Physician Assistant

## 2023-11-17 DIAGNOSIS — K0889 Other specified disorders of teeth and supporting structures: Secondary | ICD-10-CM | POA: Diagnosis not present

## 2023-11-17 DIAGNOSIS — K029 Dental caries, unspecified: Secondary | ICD-10-CM | POA: Diagnosis not present

## 2023-11-17 DIAGNOSIS — D571 Sickle-cell disease without crisis: Secondary | ICD-10-CM | POA: Diagnosis not present

## 2023-11-17 HISTORY — PX: TOOTH EXTRACTION: SHX859

## 2023-11-17 SURGERY — DENTAL RESTORATION/EXTRACTIONS
Anesthesia: General | Site: Mouth

## 2023-11-17 MED ORDER — CEFAZOLIN SODIUM-DEXTROSE 2-4 GM/100ML-% IV SOLN
2.0000 g | INTRAVENOUS | Status: DC
  Filled 2023-11-17: qty 100

## 2023-11-17 MED ORDER — DEXAMETHASONE SODIUM PHOSPHATE 10 MG/ML IJ SOLN
INTRAMUSCULAR | Status: DC | PRN
  Administered 2023-11-17: 10 mg via INTRAVENOUS

## 2023-11-17 MED ORDER — ROCURONIUM BROMIDE 10 MG/ML (PF) SYRINGE
PREFILLED_SYRINGE | INTRAVENOUS | Status: AC
  Filled 2023-11-17: qty 10

## 2023-11-17 MED ORDER — FENTANYL CITRATE (PF) 250 MCG/5ML IJ SOLN
INTRAMUSCULAR | Status: AC
  Filled 2023-11-17: qty 5

## 2023-11-17 MED ORDER — PHENYLEPHRINE 80 MCG/ML (10ML) SYRINGE FOR IV PUSH (FOR BLOOD PRESSURE SUPPORT)
PREFILLED_SYRINGE | INTRAVENOUS | Status: AC
  Filled 2023-11-17: qty 10

## 2023-11-17 MED ORDER — LIDOCAINE 2% (20 MG/ML) 5 ML SYRINGE
INTRAMUSCULAR | Status: AC
  Filled 2023-11-17: qty 5

## 2023-11-17 MED ORDER — LACTATED RINGERS IV SOLN
INTRAVENOUS | Status: DC
Start: 2023-11-17 — End: 2023-11-17

## 2023-11-17 MED ORDER — LIDOCAINE-EPINEPHRINE 2 %-1:100000 IJ SOLN
INTRAMUSCULAR | Status: DC | PRN
  Administered 2023-11-17: 15 mL via INTRADERMAL

## 2023-11-17 MED ORDER — PROPOFOL 10 MG/ML IV BOLUS
INTRAVENOUS | Status: AC
  Filled 2023-11-17: qty 20

## 2023-11-17 MED ORDER — DEXMEDETOMIDINE HCL IN NACL 80 MCG/20ML IV SOLN
INTRAVENOUS | Status: DC | PRN
  Administered 2023-11-17 (×2): 8 ug via INTRAVENOUS

## 2023-11-17 MED ORDER — LIDOCAINE 2% (20 MG/ML) 5 ML SYRINGE
INTRAMUSCULAR | Status: DC | PRN
  Administered 2023-11-17: 100 mg via INTRAVENOUS

## 2023-11-17 MED ORDER — PROPOFOL 10 MG/ML IV BOLUS
INTRAVENOUS | Status: DC | PRN
  Administered 2023-11-17: 200 mg via INTRAVENOUS
  Administered 2023-11-17: 100 mg via INTRAVENOUS

## 2023-11-17 MED ORDER — ORAL CARE MOUTH RINSE: 15.0000 mL | Freq: Once | OROMUCOSAL | Status: AC

## 2023-11-17 MED ORDER — CHLORHEXIDINE GLUCONATE 0.12 % MT SOLN
15.0000 mL | Freq: Once | OROMUCOSAL | Status: AC
  Administered 2023-11-17: 15 mL via OROMUCOSAL
  Filled 2023-11-17: qty 15

## 2023-11-17 MED ORDER — SUCCINYLCHOLINE CHLORIDE 200 MG/10ML IV SOSY
PREFILLED_SYRINGE | INTRAVENOUS | Status: DC | PRN
  Administered 2023-11-17: 140 mg via INTRAVENOUS

## 2023-11-17 MED ORDER — DEXAMETHASONE SODIUM PHOSPHATE 10 MG/ML IJ SOLN
INTRAMUSCULAR | Status: AC
  Filled 2023-11-17: qty 1

## 2023-11-17 MED ORDER — ONDANSETRON HCL 4 MG/2ML IJ SOLN
INTRAMUSCULAR | Status: DC | PRN
  Administered 2023-11-17: 4 mg via INTRAVENOUS

## 2023-11-17 MED ORDER — LIDOCAINE-EPINEPHRINE 1 %-1:100000 IJ SOLN
INTRAMUSCULAR | Status: AC
  Filled 2023-11-17: qty 1

## 2023-11-17 MED ORDER — MIDAZOLAM HCL 2 MG/2ML IJ SOLN
INTRAMUSCULAR | Status: AC
  Filled 2023-11-17: qty 2

## 2023-11-17 MED ORDER — OXYCODONE HCL 10 MG PO TABS
10.0000 mg | ORAL_TABLET | Freq: Four times a day (QID) | ORAL | 0 refills | Status: AC | PRN
  Filled 2023-11-17 (×2): qty 12, 3d supply, fill #0

## 2023-11-17 MED ORDER — FENTANYL CITRATE (PF) 250 MCG/5ML IJ SOLN
INTRAMUSCULAR | Status: DC | PRN
  Administered 2023-11-17: 150 ug via INTRAVENOUS
  Administered 2023-11-17: 50 ug via INTRAVENOUS
  Administered 2023-11-17: 100 ug via INTRAVENOUS

## 2023-11-17 MED ORDER — MIDAZOLAM HCL 2 MG/2ML IJ SOLN
INTRAMUSCULAR | Status: DC | PRN
  Administered 2023-11-17: 2 mg via INTRAVENOUS

## 2023-11-17 MED ORDER — ONDANSETRON HCL 4 MG/2ML IJ SOLN
INTRAMUSCULAR | Status: AC
  Filled 2023-11-17: qty 2

## 2023-11-17 MED ORDER — AMOXICILLIN 500 MG PO CAPS
500.0000 mg | ORAL_CAPSULE | Freq: Three times a day (TID) | ORAL | 0 refills | Status: DC
  Filled 2023-11-17: qty 21, 7d supply, fill #0

## 2023-11-17 MED ORDER — LIDOCAINE-EPINEPHRINE (PF) 1 %-1:200000 IJ SOLN: INTRAMUSCULAR | Status: DC | PRN

## 2023-11-17 SURGICAL SUPPLY — 26 items
BAG COUNTER SPONGE SURGICOUNT (BAG) IMPLANT
BLADE SURG 15 STRL LF DISP TIS (BLADE) ×2 IMPLANT
BUR CROSS CUT FISSURE 1.6 (BURR) ×2 IMPLANT
BUR EGG ELITE 4.0 (BURR) IMPLANT
CANISTER SUCT 3000ML PPV (MISCELLANEOUS) ×2 IMPLANT
COVER SURGICAL LIGHT HANDLE (MISCELLANEOUS) ×2 IMPLANT
GAUZE PACKING FOLDED 2 STR (GAUZE/BANDAGES/DRESSINGS) ×2 IMPLANT
GLOVE BIO SURGEON STRL SZ8 (GLOVE) ×2 IMPLANT
GOWN STRL REUS W/ TWL LRG LVL3 (GOWN DISPOSABLE) ×2 IMPLANT
GOWN STRL REUS W/ TWL XL LVL3 (GOWN DISPOSABLE) ×2 IMPLANT
IV NS 1000ML BAXH (IV SOLUTION) ×2 IMPLANT
KIT BASIN OR (CUSTOM PROCEDURE TRAY) ×2 IMPLANT
KIT TURNOVER KIT B (KITS) ×2 IMPLANT
NDL HYPO 25GX1X1/2 BEV (NEEDLE) ×4 IMPLANT
NEEDLE HYPO 25GX1X1/2 BEV (NEEDLE) ×2 IMPLANT
NS IRRIG 1000ML POUR BTL (IV SOLUTION) ×2 IMPLANT
PAD ARMBOARD POSITIONER FOAM (MISCELLANEOUS) ×2 IMPLANT
SLEEVE IRRIGATION ELITE 7 (MISCELLANEOUS) ×2 IMPLANT
SPIKE FLUID TRANSFER (MISCELLANEOUS) IMPLANT
SUT CHROMIC 3 0 SH 27 (SUTURE) IMPLANT
SUT PLAIN 3 0 PS2 27 (SUTURE) IMPLANT
SYR BULB IRRIG 60ML STRL (SYRINGE) ×2 IMPLANT
SYR CONTROL 10ML LL (SYRINGE) ×2 IMPLANT
TRAY ENT MC OR (CUSTOM PROCEDURE TRAY) ×2 IMPLANT
TUBING IRRIGATION (MISCELLANEOUS) ×2 IMPLANT
YANKAUER SUCT BULB TIP NO VENT (SUCTIONS) ×2 IMPLANT

## 2023-11-17 NOTE — Anesthesia Postprocedure Evaluation (Signed)
 Anesthesia Post Note  Patient: DONOVIN KRAEMER  Procedure(s) Performed: DENTAL RESTORATION/EXTRACTIONS (Mouth)     Patient location during evaluation: PACU Anesthesia Type: General Level of consciousness: awake and alert, oriented and patient cooperative Pain management: pain level controlled Vital Signs Assessment: post-procedure vital signs reviewed and stable Respiratory status: spontaneous breathing, nonlabored ventilation and respiratory function stable Cardiovascular status: blood pressure returned to baseline and stable Postop Assessment: no apparent nausea or vomiting Anesthetic complications: no   No notable events documented.  Last Vitals:  Vitals:   11/17/23 1445 11/17/23 1500  BP: (!) 150/101 139/86  Pulse: 96 90  Resp: 17 15  Temp: 37.1 C   SpO2: 94% 95%    Last Pain:  Vitals:   11/17/23 1445  TempSrc:   PainSc: 3                  Jacquelyne Matte

## 2023-11-17 NOTE — Transfer of Care (Signed)
 Immediate Anesthesia Transfer of Care Note  Patient: Austin [name]  Procedure(s) Performed: DENTAL RESTORATION/EXTRACTIONS (Mouth)  Patient Location: PACU  Anesthesia Type:General  Level of Consciousness: awake and drowsy  Airway & Oxygen  Therapy: Patient Spontanous Breathing and Patient connected to face mask oxygen   Post-op Assessment: Report given to RN and Post -op Vital signs reviewed and stable  Post vital signs: Reviewed and stable  Last Vitals:  Vitals Value Taken Time  BP 138/76 11/17/23 1415  Temp    Pulse 93 11/17/23 1417  Resp 19 11/17/23 1417  SpO2 100 % 11/17/23 1417  Vitals shown include unfiled device data.  Last Pain:  Vitals:   11/17/23 1136  TempSrc:   PainSc: 7       Patients Stated Pain Goal: 1 (11/17/23 1136)  Complications: No notable events documented.

## 2023-11-17 NOTE — H&P (Signed)
 H&P documentation  -History and Physical Reviewed  -Patient has been re-examined  -No change in the plan of care  Austin [name]

## 2023-11-17 NOTE — OR Nursing (Signed)
 Throat pack in 1333, out 1346

## 2023-11-17 NOTE — Anesthesia Procedure Notes (Signed)
 Procedure Name: Intubation Date/Time: 11/17/2023 1:28 PM  Performed by: Candance Certain, CRNAPre-anesthesia Checklist: Patient identified, Emergency Drugs available, Suction available and Patient being monitored Patient Re-evaluated:Patient Re-evaluated prior to induction Oxygen  Delivery Method: Circle System Utilized Preoxygenation: Pre-oxygenation with 100% oxygen  Induction Type: IV induction Ventilation: Mask ventilation without difficulty and Oral airway inserted - appropriate to patient size Laryngoscope Size: Mac and 4 Grade View: Grade II Tube type: Oral Tube size: 7.0 mm Number of attempts: 1 Airway Equipment and Method: Stylet and Oral airway Placement Confirmation: ETT inserted through vocal cords under direct vision, positive ETCO2 and breath sounds checked- equal and bilateral Secured at: 23 cm Tube secured with: Tape Dental Injury: Teeth and Oropharynx as per pre-operative assessment  Comments: Difficulty passing 7.0 nasal ETT despite NPA dilation. Decision made to orally intubate. Some bleeding from the nasopharynx (afrin spray to bilateral nostrils in preop and after oral ETT placed). Dentition and oral mucosa as per preop.

## 2023-11-17 NOTE — Op Note (Unsigned)
 NAME: Austin [name], Austin [name] MEDICAL RECORD NO: [id number] ACCOUNT NO: [id number] DATE OF BIRTH: 16-Oct-1990 FACILITY: [facility] LOCATION: [facility] PHYSICIAN: Cornelia Dieter, DDS  Operative Report   DATE OF PROCEDURE: 11/17/2023  PREOPERATIVE DIAGNOSES: 1.  Nonrestorable teeth numbers 19 and 31. 2.  Sickle cell disease.  POSTOPERATIVE DIAGNOSES: 1.  Nonrestorable teeth numbers 19 and 31. 2.  Sickle cell disease.  PROCEDURE:  Extraction, teeth numbers 19 and 31.  SURGEON:  Cornelia Dieter, DDS  ANESTHESIA:  General, oral intubation.  Wendy Hamel was attending.  DESCRIPTION OF PROCEDURE:  The patient was taken to the operating room and placed on the table in the supine position.  An oral endotracheal tube was placed and secured.  The eyes were protected.  The patient was draped for surgery.  Timeout was  performed.  The posterior pharynx was suctioned.  A throat pack was placed.  2% lidocaine  with 1:100,000 epinephrine  was infiltrated in an inferior alveolar block on the right and left sides with the buccal and lingual infiltration on teeth numbers 19  and 31.  A total of 10 mL was utilized.  A bite block was placed on the right side of the mouth.  A 15 blade was used to make an incision in the buccal and lingual sulcus around tooth #19.  The periosteum was reflected.  The Stryker handpiece was used  with a fissure bur under irrigation to section the tooth.  The roots were removed with the 301 elevator.  The socket was curetted and debrided.  It was then irrigated and closed with 3-0 chromic.  The throat pack was removed.  The endotracheal tube was  repositioned to the other side of the mouth.  The throat pack was repositioned, and then a 15 blade was used to make an incision around tooth #31.  The periosteum was reflected, and then the tooth was elevated, but could not be mobilized.  The Stryker  handpiece was used to create a trough between teeth numbers 31 and 30.  Then, the tooth was elevated.   The Cowhorn forceps were used to elevate the tooth and eventually remove the tooth.  The socket was curetted, irrigated, and closed with 3-0 chromic.   The oral cavity was irrigated and suctioned.  The throat pack was removed.  The patient was left in the care of anesthesia for extubation and transported to the recovery room with plans for discharge home through Day Surgery.  ESTIMATED BLOOD LOSS:  Minimum.  COMPLICATIONS:  None.  SPECIMENS: None.   PUS D: 11/17/2023 1:54:44 pm T: 11/17/2023 6:44:00 pm  JOB: [id number]/ [id number]

## 2023-11-17 NOTE — Op Note (Signed)
 11/17/2023  1:51 PM  PATIENT:  Austin [name]  33 y.o. male  PRE-OPERATIVE DIAGNOSIS:  NON RESTORABLE TEETH # 19, 31.  POST-OPERATIVE DIAGNOSIS:  SAME  PROCEDURE:  Procedure(s): EXTRACTION TEETH # 19, [id number]  SURGEON:  Surgeon(s): Ascencion Lava, DMD  ANESTHESIA:   local and general  EBL:  minimal  DRAINS: none   SPECIMEN:  No Specimen  COUNTS:  YES  PLAN OF CARE: Discharge to home after PACU  PATIENT DISPOSITION:  PACU - hemodynamically stable.   PROCEDURE DETAILS: Dictation #  Cornelia Dieter, DMD 11/17/2023 1:51 PM

## 2023-11-18 ENCOUNTER — Ambulatory Visit
Admission: EM | Admit: 2023-11-18 | Discharge: 2023-11-18 | Disposition: A | Discharge status: Home / Self Care | Attending: Nurse Practitioner | Admitting: Nurse Practitioner

## 2023-11-18 ENCOUNTER — Other Ambulatory Visit: Payer: Self-pay

## 2023-11-18 ENCOUNTER — Emergency Department (HOSPITAL_COMMUNITY)
Admission: EM | Admit: 2023-11-18 | Discharge: 2023-11-18 | Discharge status: Against Medical Advice | Attending: Emergency Medicine | Admitting: Emergency Medicine

## 2023-11-18 ENCOUNTER — Encounter (HOSPITAL_COMMUNITY): Payer: Self-pay | Admitting: Oral Surgery

## 2023-11-18 DIAGNOSIS — M79661 Pain in right lower leg: Secondary | ICD-10-CM

## 2023-11-18 DIAGNOSIS — M79604 Pain in right leg: Secondary | ICD-10-CM | POA: Diagnosis not present

## 2023-11-18 DIAGNOSIS — M79662 Pain in left lower leg: Secondary | ICD-10-CM | POA: Diagnosis not present

## 2023-11-18 DIAGNOSIS — D5700 Hb-SS disease with crisis, unspecified: Secondary | ICD-10-CM | POA: Diagnosis not present

## 2023-11-18 DIAGNOSIS — Z5321 Procedure and treatment not carried out due to patient leaving prior to being seen by health care provider: Secondary | ICD-10-CM | POA: Diagnosis not present

## 2023-11-18 DIAGNOSIS — M79605 Pain in left leg: Secondary | ICD-10-CM | POA: Diagnosis present

## 2023-11-18 MED ORDER — HYDROMORPHONE HCL 2 MG PO TABS: 2.0000 mg | ORAL_TABLET | Freq: Four times a day (QID) | ORAL | 0 refills | Status: DC | PRN

## 2023-11-18 NOTE — ED Notes (Signed)
 No answer x2 for room from lobby and outside.

## 2023-11-18 NOTE — ED Triage Notes (Addendum)
 Pt via POV c/o bilateral calf pain after having 2 teeth removed yesterday. Pt has sickle cell and says this pain feels similar to SCC. Ambulatory to triage w/o difficulty. No prior hx clots. Pt took 10mg  oxycodone  at around 9am this morning. No SOB/CP.

## 2023-11-18 NOTE — ED Provider Notes (Signed)
 [facility]    CSN: [id number] Arrival date & time: 11/18/23  1724      History   Chief Complaint Chief Complaint  Patient presents with   Sickle Cell Pain Crisis    HPI Austin [name] is a 33 y.o. male.   Austin [name] is a 33 year old male with a history of sickle cell disease who presents with pain similar to his usual sickle cell crisis pain. He reports having two teeth extracted under general anesthesia one day ago and woke up from anesthesia with pain in the back of both legs. Despite taking his prescribed oxycodone  every six hours, he has had no improvement in his pain. He denies leg swelling, fever, chest pain, shortness of breath, nausea, or vomiting. Prior to presenting here, he went to [facility] but left after waiting over three hours without being seen by a provider. He was informed by nursing staff that his wait time would likely be at least another hour and a half, at which point he chose to leave and seek care at urgent care instead.  The following portions of the patient's history were reviewed and updated as appropriate: allergies, current medications, past family history, past medical history, past social history, past surgical history, and problem list.                Past Medical History:  Diagnosis Date   Acute maxillary sinusitis    Eczema    Sickle cell anemia (HCC) .    Patient Active Problem List   Diagnosis Date Noted   Leukocytosis 06/28/2023   Sickle cell pain crisis (HCC) 06/07/2023   Sickle cell crisis (HCC) 10/14/2022   Sickle cell anemia with pain (HCC) 10/08/2022   Hypokalemia 03/05/2021   Anemia of chronic disease 03/05/2021   Vitamin D  deficiency 11/16/2020   Bilateral lower extremity pain 10/17/2019   Chronic pain syndrome 09/20/2019   Nausea 09/20/2019   Chronic, continuous use of opioids 09/20/2019   Medication management 03/20/2019   Bilious vomiting without nausea    Viral  gastroenteritis 01/23/2019   Generalized abdominal pain    Dehydration    Acute sickle cell crisis (HCC) 12/15/2017   Thrombocytopenia (HCC) 12/15/2017   Splenomegaly 12/15/2017   Sickle cell anemia with crisis (HCC) 12/15/2017    Past Surgical History:  Procedure Laterality Date   FOOT SURGERY Left 12/25/2020   TOOTH EXTRACTION N/A 11/17/2023   Procedure: DENTAL RESTORATION/EXTRACTIONS;  Surgeon: Ascencion Lava, DMD;  Location: [facility] OR;  Service: Oral Surgery;  Laterality: N/A;   WISDOM TOOTH EXTRACTION         Home Medications    Prior to Admission medications   Medication Sig Start Date End Date Taking? Authorizing Provider  HYDROmorphone  (DILAUDID ) 2 MG tablet Take 1 tablet (2 mg total) by mouth every 6 (six) hours as needed for severe pain (pain score 7-10). Do not take with oxycodone !!!! 11/18/23  Yes Elhadj Girton, FNP  amoxicillin  (AMOXIL ) 500 MG capsule Take 1 capsule (500 mg total) by mouth 3 (three) times daily. 11/17/23   Ascencion Lava, DMD  ibuprofen  (ADVIL ) 800 MG tablet Take 800 mg by mouth every 6 (six) hours as needed. 10/27/23   [provider]  Oxycodone  HCl 10 MG TABS Take 1 tablet (10 mg total) by mouth every 6 (six) hours as needed for up to 3 days. 11/17/23 11/20/23  Ascencion Lava, DMD  penicillin v potassium (VEETID) 500 MG tablet Take 500 mg by mouth 4 (four)  times daily. 10/27/23   [provider]  promethazine  (PHENERGAN ) 25 MG tablet Take 1 tablet (25 mg total) by mouth every 6 (six) hours as needed for nausea or vomiting. 11/02/23   Nichols, Tonya S, NP  tadalafil (CIALIS) 5 MG tablet Take 5 mg by mouth daily as needed for erectile dysfunction. 08/29/23   [provider]  Vitamin D , Ergocalciferol , (DRISDOL ) 1.25 MG (50000 UNIT) CAPS capsule Take 1 capsule (50,000 Units total) by mouth every 7 (seven) days. Patient not taking: Reported on 11/11/2023 08/29/23   Jerrlyn Morel, NP    Family History Family History  Problem Relation Age of  Onset   Sickle cell trait Mother    Diabetes Father    Hypertension Father     Social History Social History   Tobacco Use   Smoking status: Former    Current packs/day: 0.00    Types: Cigarettes    Quit date: 11/23/2013    Years since quitting: 9.9   Smokeless tobacco: Never  Vaping Use   Vaping status: Never Used  Substance Use Topics   Alcohol use: Not Currently    Alcohol/week: 1.0 standard drink of alcohol    Types: 1 Standard drinks or equivalent per week   Drug use: Yes    Types: Marijuana    Comment: he states "I smoke weed every day"     Allergies   Patient has no known allergies.   Review of Systems Review of Systems  Constitutional:  Negative for chills, diaphoresis, fatigue and fever.  Respiratory:  Negative for cough, chest tightness and shortness of breath.   Cardiovascular:  Negative for chest pain, palpitations and leg swelling.  Gastrointestinal:  Negative for nausea and vomiting.  Musculoskeletal:  Positive for myalgias. Negative for back pain, gait problem and joint swelling.  Neurological:  Negative for weakness and numbness.  All other systems reviewed and are negative.    Physical Exam Triage Vital Signs ED Triage Vitals [11/18/23 1742]  Encounter Vitals Group     BP (!) 140/85     Systolic BP Percentile      Diastolic BP Percentile      Pulse Rate 91     Resp 18     Temp 98.2 F (36.8 C)     Temp Source Oral     SpO2 94 %     Weight      Height      Head Circumference      Peak Flow      Pain Score 8     Pain Loc      Pain Education      Exclude from Growth Chart    No data found.  Updated Vital Signs BP (!) 140/85 (BP Location: Left Arm)   Pulse 91   Temp 98.2 F (36.8 C) (Oral)   Resp 18   SpO2 94%   Visual Acuity Right Eye Distance:   Left Eye Distance:   Bilateral Distance:    Right Eye Near:   Left Eye Near:    Bilateral Near:     Physical Exam Vitals reviewed.  Constitutional:      General: He is awake.  He is not in acute distress.    Appearance: Normal appearance. He is well-developed and well-groomed. He is not ill-appearing, toxic-appearing or diaphoretic.  HENT:     Head: Normocephalic.     Mouth/Throat:     Mouth: Mucous membranes are moist.  Eyes:     Conjunctiva/sclera:  Conjunctivae normal.  Cardiovascular:     Rate and Rhythm: Normal rate and regular rhythm.     Pulses: Normal pulses.     Heart sounds: Normal heart sounds.  Pulmonary:     Effort: Pulmonary effort is normal. No tachypnea or respiratory distress.     Breath sounds: Normal breath sounds and air entry.  Musculoskeletal:        General: Normal range of motion.     Right lower leg: No edema.     Left lower leg: No edema.  Skin:    General: Skin is warm and dry.  Neurological:     General: No focal deficit present.     Mental Status: He is alert and oriented to person, place, and time.     Cranial Nerves: No cranial nerve deficit.     Sensory: Sensation is intact. No sensory deficit.     Motor: Motor function is intact. No weakness.     Coordination: Coordination is intact.     Gait: Gait is intact.  Psychiatric:        Attention and Perception: Attention and perception normal.        Mood and Affect: Mood and affect normal.        Speech: Speech normal.        Behavior: Behavior is cooperative.        Thought Content: Thought content normal.        Cognition and Memory: Cognition and memory normal.        Judgment: Judgment normal.      UC Treatments / Results  Labs (all labs ordered are listed, but only abnormal results are displayed) Labs Reviewed - No data to display  EKG   Radiology No results found.  Procedures Procedures (including critical care time)  Medications Ordered in UC Medications - No data to display  Initial Impression / Assessment and Plan / UC Course  I have reviewed the triage vital signs and the nursing notes.  Pertinent labs & imaging results that were available  during my care of the patient were reviewed by me and considered in my medical decision making (see chart for details).    33 year old male with a history of sickle cell disease presenting with leg pain consistent with his typical sickle cell crisis pain. He is alert, oriented, afebrile, and nontoxic. Physical examination is as noted above and without any focal findings. Hydromorphone  2 mg, four tablets, was prescribed with instructions to take one tablet every six hours as needed to manage pain. The patient was strongly advised not to take oxycodone  while using hydromorphone , as combining these medications could be dangerous and potentially life-threatening. He was also advised to seek care in the emergency department if his symptoms worsen or if he does not achieve adequate pain relief. The patient verbalized understanding of the treatment plan and instructions provided.  Today's evaluation has revealed no signs of a dangerous process. Discussed diagnosis with patient and/or guardian. Patient and/or guardian aware of their diagnosis, possible red flag symptoms to watch out for and need for close follow up. Patient and/or guardian understands verbal and written discharge instructions. Patient and/or guardian comfortable with plan and disposition.  Patient and/or guardian has a clear mental status at this time, good insight into illness (after discussion and teaching) and has clear judgment to make decisions regarding their care  Documentation was completed with the aid of voice recognition software. Transcription may contain typographical errors. Final Clinical Impressions(s) / UC Diagnoses  Final diagnoses:  Acute sickle cell crisis (HCC)  Bilateral calf pain     Discharge Instructions      You were seen today for leg pain consistent with your usual sickle cell crisis symptoms. You have been provided with four tablets of hydromorphone  to take every six hours as needed for pain. It is very  important that you do not take any oxycodone  while using hydromorphone , as combining these medications can be dangerous. If your pain does not improve after taking the medication or if it gets worse, you must go to the emergency department immediately for further care.     ED Prescriptions     Medication Sig Dispense Auth. Provider   HYDROmorphone  (DILAUDID ) 2 MG tablet Take 1 tablet (2 mg total) by mouth every 6 (six) hours as needed for severe pain (pain score 7-10). Do not take with oxycodone !!!! 4 tablet Maryruth Sol, FNP      I have reviewed the PDMP during this encounter.   Maryruth Sol, [place] 11/18/23 [phone]

## 2023-11-18 NOTE — ED Triage Notes (Signed)
 Pt c/o bilat calf pain 8/10 that he states feels like sickle cell crisis. Pain began yesterday after waking from anesthesia s/p dental surgery. Has had little relief with usual pain regimen. Denies any CP or SOB.

## 2023-11-18 NOTE — Discharge Instructions (Addendum)
 You were seen today for leg pain consistent with your usual sickle cell crisis symptoms. You have been provided with four tablets of hydromorphone  to take every six hours as needed for pain. It is very important that you do not take any oxycodone  while using hydromorphone , as combining these medications can be dangerous. If your pain does not improve after taking the medication or if it gets worse, you must go to the emergency department immediately for further care.

## 2023-11-24 ENCOUNTER — Ambulatory Visit: Payer: Self-pay | Admitting: Nurse Practitioner

## 2023-11-24 ENCOUNTER — Other Ambulatory Visit (HOSPITAL_COMMUNITY): Payer: Self-pay

## 2023-11-24 ENCOUNTER — Encounter: Payer: Self-pay | Admitting: Nurse Practitioner

## 2023-11-24 VITALS — BP 117/72 | HR 86 | Wt 187.0 lb

## 2023-11-24 DIAGNOSIS — D571 Sickle-cell disease without crisis: Secondary | ICD-10-CM | POA: Diagnosis not present

## 2023-11-24 DIAGNOSIS — D5700 Hb-SS disease with crisis, unspecified: Secondary | ICD-10-CM | POA: Diagnosis not present

## 2023-11-24 DIAGNOSIS — H547 Unspecified visual loss: Secondary | ICD-10-CM

## 2023-11-24 MED ORDER — OXYCODONE HCL 10 MG PO TABS
10.0000 mg | ORAL_TABLET | Freq: Four times a day (QID) | ORAL | 0 refills | Status: DC | PRN
  Filled 2023-11-24: qty 60, 15d supply, fill #0

## 2023-11-24 NOTE — Progress Notes (Signed)
 Subjective   Patient ID: Austin [name], male    DOB: 01-07-1991, 33 y.o.   MRN: [id number]  Chief Complaint  Patient presents with   Sickle Cell Anemia    Referring provider: Sigurd Driver, FNP  Austin [name] is a 33 y.o. male with Past Medical History: No date: Acute maxillary sinusitis No date: Eczema .: Sickle cell anemia (HCC)   HPI  Patient presents today for a follow-up visit. This is a patient of Dr. Jegede. He has had several ED visits for sickle cell crisis over the past few months. Patient usually takes oxycodone  HCL 10 mg for sickle cell pain. Denies f/c/s, n/v/d, hemoptysis, PND, leg swelling. Denies chest pain or edema.     No Known Allergies  Immunization History  Administered Date(s) Administered   Influenza,inj,Quad PF,6+ Mos 03/13/2018   Influenza-Unspecified 04/25/2020   PFIZER(Purple Top)SARS-COV-2 Vaccination 04/04/2020, 04/25/2020   Tdap 08/27/2015, 04/11/2023    Tobacco History: Social History   Tobacco Use  Smoking Status Former   Current packs/day: 0.00   Types: Cigarettes   Quit date: 11/23/2013   Years since quitting: 10.0  Smokeless Tobacco Never   Counseling given: Not Answered   Outpatient Encounter Medications as of 11/24/2023  Medication Sig   amoxicillin  (AMOXIL ) 500 MG capsule Take 1 capsule (500 mg total) by mouth 3 (three) times daily.   ibuprofen  (ADVIL ) 800 MG tablet Take 800 mg by mouth every 6 (six) hours as needed.   promethazine  (PHENERGAN ) 25 MG tablet Take 1 tablet (25 mg total) by mouth every 6 (six) hours as needed for nausea or vomiting.   tadalafil (CIALIS) 5 MG tablet Take 5 mg by mouth daily as needed for erectile dysfunction.   Vitamin D , Ergocalciferol , (DRISDOL ) 1.25 MG (50000 UNIT) CAPS capsule Take 1 capsule (50,000 Units total) by mouth every 7 (seven) days.   HYDROmorphone  (DILAUDID ) 2 MG tablet Take 1 tablet (2 mg total) by mouth every 6 (six) hours as needed for severe pain (pain score 7-10).  Do not take with oxycodone !!!! (Patient not taking: Reported on 11/24/2023)   penicillin v potassium (VEETID) 500 MG tablet Take 500 mg by mouth 4 (four) times daily. (Patient not taking: Reported on 11/24/2023)   No facility-administered encounter medications on file as of 11/24/2023.    Review of Systems  Review of Systems  Constitutional: Negative.   HENT: Negative.    Cardiovascular: Negative.   Gastrointestinal: Negative.   Allergic/Immunologic: Negative.   Neurological: Negative.   Psychiatric/Behavioral: Negative.       Objective:   BP 117/72   Pulse 86   Wt 187 lb (84.8 kg)   SpO2 100%   BMI 28.43 kg/m   Wt Readings from Last 5 Encounters:  11/24/23 187 lb (84.8 kg)  11/18/23 185 lb (83.9 kg)  11/17/23 185 lb (83.9 kg)  10/20/23 189 lb 9.5 oz (86 kg)  10/08/23 188 lb (85.3 kg)     Physical Exam Vitals and nursing note reviewed.  Constitutional:      General: He is not in acute distress.    Appearance: He is well-developed.  Cardiovascular:     Rate and Rhythm: Normal rate and regular rhythm.  Pulmonary:     Effort: Pulmonary effort is normal.     Breath sounds: Normal breath sounds.  Skin:    General: Skin is warm and dry.  Neurological:     Mental Status: He is alert and oriented to person, place, and time.  Assessment & Plan:   Sickle cell anemia without crisis (HCC) -     Sickle Cell Panel -     ToxAssure Flex 15, Ur     No follow-ups on file.   Jerrlyn Morel, NP 11/24/2023

## 2023-11-24 NOTE — Patient Instructions (Signed)
 [facility] (accepts Medicaid, Medicare, [employer], and/or Self-Pay) [facility]  [address], [address],  [place], [place] [address] [phone]  [facility]  [address] [place], [place] [address] [phone]  [facility]  [facility] [place] [address] [place], [place] [address] *Located next to [facility]  [phone]  [facility]  [address] [place], [place] [address]  *Located next to [employer] [phone]  [facility]  [address] [place], [place] [address] [phone]  [facility]  [address] [place], [place] [address]  [phone] *Located inside [facility]  [address] [place], [place] [address] [phone]  [facility] [address] [place], [place] [address] [phone] [address] [place], [place] [address] [phone]  [facility] [address] [place], [place] [address] [phone]  [facility]  [address] [place], [place] [address] [phone]  [facility]  [address] [place], [place] [address] [phone]

## 2023-11-25 LAB — CMP14+CBC/D/PLT+FER+RETIC+V...
ALT: 32 IU/L (ref 0–44)
AST: 10 IU/L (ref 0–40)
Albumin: 4.7 g/dL (ref 4.1–5.1)
Alkaline Phosphatase: 65 IU/L (ref 44–121)
BUN/Creatinine Ratio: 9 (ref 9–20)
BUN: 9 mg/dL (ref 6–20)
Basophils Absolute: 0 10*3/uL (ref 0.0–0.2)
Basos: 0 %
Bilirubin Total: 1.2 mg/dL (ref 0.0–1.2)
CO2: 24 mmol/L (ref 20–29)
Calcium: 9.1 mg/dL (ref 8.7–10.2)
Chloride: 98 mmol/L (ref 96–106)
Creatinine, Ser: 0.99 mg/dL (ref 0.76–1.27)
EOS (ABSOLUTE): 0.3 10*3/uL (ref 0.0–0.4)
Eos: 3 %
Ferritin: 581 ng/mL — ABNORMAL HIGH (ref 30–400)
Globulin, Total: 2.8 g/dL (ref 1.5–4.5)
Glucose: 117 mg/dL — ABNORMAL HIGH (ref 70–99)
Hematocrit: 42.5 % (ref 37.5–51.0)
Hemoglobin: 13.7 g/dL (ref 13.0–17.7)
Immature Grans (Abs): 0 10*3/uL (ref 0.0–0.1)
Immature Granulocytes: 0 %
Lymphocytes Absolute: 3.5 10*3/uL — ABNORMAL HIGH (ref 0.7–3.1)
Lymphs: 31 %
MCH: 28.1 pg (ref 26.6–33.0)
MCHC: 32.2 g/dL (ref 31.5–35.7)
MCV: 87 fL (ref 79–97)
Monocytes Absolute: 0.8 10*3/uL (ref 0.1–0.9)
Monocytes: 7 %
NRBC: 1 % — ABNORMAL HIGH (ref 0–0)
Neutrophils Absolute: 6.8 10*3/uL (ref 1.4–7.0)
Neutrophils: 59 %
Platelets: 184 10*3/uL (ref 150–450)
Potassium: 4.1 mmol/L (ref 3.5–5.2)
RBC: 4.87 x10E6/uL (ref 4.14–5.80)
RDW: 15.2 % (ref 11.6–15.4)
Retic Ct Pct: 5.6 % — ABNORMAL HIGH (ref 0.6–2.6)
Sodium: 139 mmol/L (ref 134–144)
Total Protein: 7.5 g/dL (ref 6.0–8.5)
Vit D, 25-Hydroxy: 29 ng/mL — ABNORMAL LOW (ref 30.0–100.0)
WBC: 11.4 10*3/uL — ABNORMAL HIGH (ref 3.4–10.8)
eGFR: 103 mL/min/{1.73_m2} (ref >59)

## 2023-11-29 LAB — TOXASSURE FLEX 15, UR
6-ACETYLMORPHINE IA: NEGATIVE ng/mL
AMPHETAMINES IA: NEGATIVE ng/mL
BARBITURATES IA: NEGATIVE ng/mL
COCAINE METABOLITE IA: NEGATIVE ng/mL
Creatinine: 236 mg/dL (ref >=20)
ETHYL ALCOHOL Enzymatic: NEGATIVE g/dL
METHADONE IA: NEGATIVE ng/mL
METHADONE MTB IA: NEGATIVE ng/mL
PHENCYCLIDINE IA: NEGATIVE ng/mL
TAPENTADOL, IA: NEGATIVE ng/mL
TRAMADOL IA: NEGATIVE ng/mL

## 2023-11-29 LAB — OXYCODONE CLASS, MS, UR RFX
Noroxycodone: 713 ng/mg{creat}
Noroxymorphone: 76 ng/mg{creat}
Oxycodone Class Confirmation: POSITIVE
Oxycodone: 413 ng/mg{creat}
Oxymorphone: 330 ng/mg{creat}

## 2023-11-29 LAB — OPIATE CLASS, MS, UR RFX
Codeine: NOT DETECTED ng/mg{creat}
Dihydrocodeine: NOT DETECTED ng/mg{creat}
Hydrocodone: NOT DETECTED ng/mg{creat}
Hydromorphone: 146 ng/mg{creat}
Morphine: NOT DETECTED ng/mg{creat}
Norcodeine: NOT DETECTED ng/mg{creat}
Norhydrocodone: NOT DETECTED ng/mg{creat}
Normorphine: NOT DETECTED ng/mg{creat}
Opiate Class Confirmation: POSITIVE

## 2023-11-29 LAB — CANNABINOIDS, MS, UR RFX
Cannabinoids Confirmation: POSITIVE
Carboxy-THC: >424 ng/mg{creat}

## 2023-12-06 ENCOUNTER — Other Ambulatory Visit: Payer: Self-pay

## 2023-12-06 ENCOUNTER — Other Ambulatory Visit: Payer: Self-pay | Admitting: Nurse Practitioner

## 2023-12-06 DIAGNOSIS — D5700 Hb-SS disease with crisis, unspecified: Secondary | ICD-10-CM

## 2023-12-06 DIAGNOSIS — R110 Nausea: Secondary | ICD-10-CM

## 2023-12-07 ENCOUNTER — Other Ambulatory Visit (HOSPITAL_COMMUNITY): Payer: Self-pay

## 2023-12-07 MED ORDER — PROMETHAZINE HCL 25 MG PO TABS
25.0000 mg | ORAL_TABLET | Freq: Four times a day (QID) | ORAL | 0 refills | Status: DC | PRN
Start: 2023-12-07 — End: 2023-12-22
  Filled 2023-12-07: qty 20, 5d supply, fill #0

## 2023-12-07 MED ORDER — OXYCODONE HCL 10 MG PO TABS
10.0000 mg | ORAL_TABLET | Freq: Four times a day (QID) | ORAL | 0 refills | Status: DC | PRN
  Filled 2023-12-08: qty 60, 15d supply, fill #0
  Filled ????-??-??: fill #0

## 2023-12-08 ENCOUNTER — Telehealth: Payer: Self-pay

## 2023-12-08 ENCOUNTER — Other Ambulatory Visit: Payer: Self-pay

## 2023-12-08 ENCOUNTER — Other Ambulatory Visit (HOSPITAL_COMMUNITY): Payer: Self-pay

## 2023-12-08 NOTE — Telephone Encounter (Signed)
 Pharmacy Patient Advocate Encounter   Received notification from CoverMyMeds that prior authorization for OXYCODONE  is required/requested.   Insurance verification completed.   The patient is insured through [employer] .   Per test claim: PA required; PA submitted to above mentioned insurance via CoverMyMeds Key/confirmation #/[id number] Status is pending  SUBMITTED AS EXPEDITED REQUEST

## 2023-12-09 ENCOUNTER — Other Ambulatory Visit (HOSPITAL_COMMUNITY): Payer: Self-pay

## 2023-12-09 ENCOUNTER — Telehealth: Payer: Self-pay

## 2023-12-09 NOTE — Telephone Encounter (Signed)
 Pharmacy Patient Advocate Encounter  Received notification from [facility] that Prior Authorization for OXYCODONE  has been APPROVED from 12/08/2023 to 12/07/2024

## 2023-12-13 ENCOUNTER — Ambulatory Visit
Admission: EM | Admit: 2023-12-13 | Discharge: 2023-12-13 | Disposition: A | Discharge status: Home / Self Care | Attending: Family Medicine | Admitting: Family Medicine

## 2023-12-13 DIAGNOSIS — M542 Cervicalgia: Secondary | ICD-10-CM

## 2023-12-13 DIAGNOSIS — M549 Dorsalgia, unspecified: Secondary | ICD-10-CM

## 2023-12-13 MED ORDER — IBUPROFEN 800 MG PO TABS: 800.0000 mg | ORAL_TABLET | Freq: Three times a day (TID) | ORAL | 0 refills | Status: AC | PRN

## 2023-12-13 MED ORDER — TIZANIDINE HCL 4 MG PO TABS: 4.0000 mg | ORAL_TABLET | Freq: Three times a day (TID) | ORAL | 0 refills | Status: DC | PRN
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
## 2023-12-13 NOTE — Discharge Instructions (Signed)
 Take ibuprofen 800 mg--1 tab every 8 hours as needed for pain.   Take tizanidine 4 mg--1 every 8 hours as needed for muscle spasms; this medication can cause dizziness and sleepiness
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
## 2023-12-22 ENCOUNTER — Other Ambulatory Visit (HOSPITAL_COMMUNITY): Payer: Self-pay

## 2023-12-22 ENCOUNTER — Other Ambulatory Visit: Payer: Self-pay | Admitting: Nurse Practitioner

## 2023-12-22 DIAGNOSIS — D5700 Hb-SS disease with crisis, unspecified: Secondary | ICD-10-CM

## 2023-12-22 DIAGNOSIS — R110 Nausea: Secondary | ICD-10-CM

## 2023-12-22 MED ORDER — OXYCODONE HCL 10 MG PO TABS
10.0000 mg | ORAL_TABLET | Freq: Four times a day (QID) | ORAL | 0 refills | Status: DC | PRN
  Filled 2023-12-22: qty 60, 15d supply, fill #0

## 2023-12-22 MED ORDER — PROMETHAZINE HCL 25 MG PO TABS
25.0000 mg | ORAL_TABLET | Freq: Four times a day (QID) | ORAL | 0 refills | Status: DC | PRN
  Filled 2023-12-22: qty 20, 5d supply, fill #0

## 2024-01-04 ENCOUNTER — Other Ambulatory Visit: Payer: Self-pay | Admitting: Nurse Practitioner

## 2024-01-04 DIAGNOSIS — R110 Nausea: Secondary | ICD-10-CM

## 2024-01-04 DIAGNOSIS — D5700 Hb-SS disease with crisis, unspecified: Secondary | ICD-10-CM

## 2024-01-08 MED ORDER — PROMETHAZINE HCL 25 MG PO TABS
25.0000 mg | ORAL_TABLET | Freq: Four times a day (QID) | ORAL | 0 refills | Status: DC | PRN
Start: 2024-01-08 — End: 2024-01-20
  Filled 2024-01-08: qty 20, 5d supply, fill #0

## 2024-01-08 MED ORDER — OXYCODONE HCL 10 MG PO TABS
10.0000 mg | ORAL_TABLET | Freq: Four times a day (QID) | ORAL | 0 refills | Status: DC | PRN
Start: 2024-01-08 — End: 2024-01-20
  Filled 2024-01-08: qty 60, 15d supply, fill #0

## 2024-01-09 ENCOUNTER — Other Ambulatory Visit (HOSPITAL_COMMUNITY): Payer: Self-pay

## 2024-01-20 ENCOUNTER — Other Ambulatory Visit: Payer: Self-pay | Admitting: Nurse Practitioner

## 2024-01-20 ENCOUNTER — Other Ambulatory Visit (HOSPITAL_BASED_OUTPATIENT_CLINIC_OR_DEPARTMENT_OTHER): Payer: Self-pay

## 2024-01-20 ENCOUNTER — Other Ambulatory Visit (HOSPITAL_COMMUNITY): Payer: Self-pay

## 2024-01-20 DIAGNOSIS — D5700 Hb-SS disease with crisis, unspecified: Secondary | ICD-10-CM

## 2024-01-20 DIAGNOSIS — R110 Nausea: Secondary | ICD-10-CM

## 2024-01-20 MED ORDER — PROMETHAZINE HCL 25 MG PO TABS
25.0000 mg | ORAL_TABLET | Freq: Four times a day (QID) | ORAL | 0 refills | Status: DC | PRN
Start: 2024-01-20 — End: 2024-02-07
  Filled 2024-01-20: qty 20, 5d supply, fill #0

## 2024-01-20 MED ORDER — OXYCODONE HCL 10 MG PO TABS
10.0000 mg | ORAL_TABLET | Freq: Four times a day (QID) | ORAL | 0 refills | Status: DC | PRN
  Filled 2024-01-23: qty 60, 15d supply, fill #0

## 2024-01-23 ENCOUNTER — Other Ambulatory Visit: Payer: Self-pay

## 2024-01-23 ENCOUNTER — Other Ambulatory Visit (HOSPITAL_COMMUNITY): Payer: Self-pay

## 2024-02-05 ENCOUNTER — Other Ambulatory Visit: Payer: Self-pay

## 2024-02-05 ENCOUNTER — Encounter (HOSPITAL_COMMUNITY): Payer: Self-pay | Admitting: Emergency Medicine

## 2024-02-05 ENCOUNTER — Emergency Department (HOSPITAL_COMMUNITY)

## 2024-02-05 ENCOUNTER — Inpatient Hospital Stay (HOSPITAL_COMMUNITY)
Admission: EM | Admit: 2024-02-05 | Discharge: 2024-02-07 | DRG: 812 | Disposition: A | Discharge status: Home / Self Care | Attending: Internal Medicine | Admitting: Internal Medicine

## 2024-02-05 DIAGNOSIS — Z79899 Other long term (current) drug therapy: Secondary | ICD-10-CM | POA: Diagnosis not present

## 2024-02-05 DIAGNOSIS — G894 Chronic pain syndrome: Secondary | ICD-10-CM | POA: Diagnosis present

## 2024-02-05 DIAGNOSIS — D72829 Elevated white blood cell count, unspecified: Secondary | ICD-10-CM | POA: Diagnosis present

## 2024-02-05 DIAGNOSIS — D696 Thrombocytopenia, unspecified: Secondary | ICD-10-CM | POA: Diagnosis present

## 2024-02-05 DIAGNOSIS — D638 Anemia in other chronic diseases classified elsewhere: Secondary | ICD-10-CM | POA: Diagnosis present

## 2024-02-05 DIAGNOSIS — D5700 Hb-SS disease with crisis, unspecified: Secondary | ICD-10-CM | POA: Diagnosis present

## 2024-02-05 DIAGNOSIS — Z8249 Family history of ischemic heart disease and other diseases of the circulatory system: Secondary | ICD-10-CM

## 2024-02-05 DIAGNOSIS — Z87891 Personal history of nicotine dependence: Secondary | ICD-10-CM | POA: Diagnosis not present

## 2024-02-05 DIAGNOSIS — Z833 Family history of diabetes mellitus: Secondary | ICD-10-CM | POA: Diagnosis not present

## 2024-02-05 DIAGNOSIS — Z832 Family history of diseases of the blood and blood-forming organs and certain disorders involving the immune mechanism: Secondary | ICD-10-CM

## 2024-02-05 LAB — CBC WITH DIFFERENTIAL/PLATELET
Abs Immature Granulocytes: 0.03 K/uL (ref 0.00–0.07)
Basophils Absolute: 0 K/uL (ref 0.0–0.1)
Basophils Relative: 0 %
Eosinophils Absolute: 0.1 K/uL (ref 0.0–0.5)
Eosinophils Relative: 1 %
HCT: 37.2 % — ABNORMAL LOW (ref 39.0–52.0)
Hemoglobin: 13 g/dL (ref 13.0–17.0)
Immature Granulocytes: 0 %
Lymphocytes Relative: 22 %
Lymphs Abs: 2.2 K/uL (ref 0.7–4.0)
MCH: 27.7 pg (ref 26.0–34.0)
MCHC: 34.9 g/dL (ref 30.0–36.0)
MCV: 79.3 fL — ABNORMAL LOW (ref 80.0–100.0)
Monocytes Absolute: 1 K/uL (ref 0.1–1.0)
Monocytes Relative: 10 %
Neutro Abs: 6.4 K/uL (ref 1.7–7.7)
Neutrophils Relative %: 67 %
Platelets: 128 K/uL — ABNORMAL LOW (ref 150–400)
RBC: 4.69 MIL/uL (ref 4.22–5.81)
RDW: 14.1 % (ref 11.5–15.5)
WBC: 9.8 K/uL (ref 4.0–10.5)
nRBC: 0.2 % (ref 0.0–0.2)

## 2024-02-05 LAB — COMPREHENSIVE METABOLIC PANEL WITH GFR
ALT: 30 U/L (ref 0–44)
AST: 13 U/L — ABNORMAL LOW (ref 15–41)
Albumin: 4.6 g/dL (ref 3.5–5.0)
Alkaline Phosphatase: 57 U/L (ref 38–126)
Anion gap: 9 (ref 5–15)
BUN: 12 mg/dL (ref 6–20)
CO2: 25 mmol/L (ref 22–32)
Calcium: 9.4 mg/dL (ref 8.9–10.3)
Chloride: 102 mmol/L (ref 98–111)
Creatinine, Ser: 0.9 mg/dL (ref 0.61–1.24)
GFR, Estimated: >60 mL/min (ref >60)
Glucose, Bld: 91 mg/dL (ref 70–99)
Potassium: 4.1 mmol/L (ref 3.5–5.1)
Sodium: 136 mmol/L (ref 135–145)
Total Bilirubin: 2.3 mg/dL — ABNORMAL HIGH (ref 0.0–1.2)
Total Protein: 8.2 g/dL — ABNORMAL HIGH (ref 6.5–8.1)

## 2024-02-05 LAB — RETICULOCYTES
Immature Retic Fract: 22.8 % — ABNORMAL HIGH (ref 2.3–15.9)
RBC.: 4.61 MIL/uL (ref 4.22–5.81)
Retic Count, Absolute: 192.2 K/uL — ABNORMAL HIGH (ref 19.0–186.0)
Retic Ct Pct: 4.2 % — ABNORMAL HIGH (ref 0.4–3.1)

## 2024-02-05 LAB — TROPONIN I (HIGH SENSITIVITY): Troponin I (High Sensitivity): <2 ng/L (ref <18)

## 2024-02-05 MED ORDER — HYDROMORPHONE HCL 1 MG/ML IJ SOLN
2.0000 mg | INTRAMUSCULAR | Status: AC
  Administered 2024-02-05: 2 mg via INTRAVENOUS
  Filled 2024-02-05: qty 2

## 2024-02-05 MED ORDER — HYDROMORPHONE 1 MG/ML IV SOLN
INTRAVENOUS | Status: DC
  Administered 2024-02-05: 30 mg via INTRAVENOUS
  Administered 2024-02-06: 2 mg via INTRAVENOUS
  Administered 2024-02-06: 5 mg via INTRAVENOUS
  Administered 2024-02-06: 2.5 mg via INTRAVENOUS
  Administered 2024-02-06: 4 mg via INTRAVENOUS
  Administered 2024-02-07: 1.5 mg via INTRAVENOUS
  Filled 2024-02-05: qty 30

## 2024-02-05 MED ORDER — SODIUM CHLORIDE 0.9% FLUSH: 9.0000 mL | INTRAVENOUS | Status: DC | PRN

## 2024-02-05 MED ORDER — PROMETHAZINE HCL 25 MG RE SUPP: 12.5000 mg | RECTAL | Status: DC | PRN

## 2024-02-05 MED ORDER — KETOROLAC TROMETHAMINE 15 MG/ML IJ SOLN
15.0000 mg | Freq: Four times a day (QID) | INTRAMUSCULAR | Status: DC
Start: 2024-02-06 — End: 2024-02-10
  Administered 2024-02-05 – 2024-02-07 (×5): 15 mg via INTRAVENOUS
  Filled 2024-02-05 (×6): qty 1

## 2024-02-05 MED ORDER — ENOXAPARIN SODIUM 40 MG/0.4ML IJ SOSY
40.0000 mg | PREFILLED_SYRINGE | INTRAMUSCULAR | Status: DC
  Filled 2024-02-05: qty 0.4

## 2024-02-05 MED ORDER — POLYETHYLENE GLYCOL 3350 17 G PO PACK: 17.0000 g | PACK | Freq: Every day | ORAL | Status: DC | PRN

## 2024-02-05 MED ORDER — SENNOSIDES-DOCUSATE SODIUM 8.6-50 MG PO TABS
1.0000 | ORAL_TABLET | Freq: Two times a day (BID) | ORAL | Status: DC
  Administered 2024-02-06: 1 via ORAL
  Filled 2024-02-05 (×3): qty 1

## 2024-02-05 MED ORDER — TIZANIDINE HCL 4 MG PO TABS: 4.0000 mg | ORAL_TABLET | Freq: Three times a day (TID) | ORAL | Status: DC | PRN

## 2024-02-05 MED ORDER — NALOXONE HCL 0.4 MG/ML IJ SOLN: 0.4000 mg | INTRAMUSCULAR | Status: DC | PRN

## 2024-02-05 MED ORDER — OXYCODONE HCL 5 MG PO TABS: 10.0000 mg | ORAL_TABLET | Freq: Four times a day (QID) | ORAL | Status: DC | PRN

## 2024-02-05 MED ORDER — OXYCODONE HCL 10 MG PO TABS: 10.0000 mg | ORAL_TABLET | Freq: Four times a day (QID) | ORAL | Status: DC | PRN

## 2024-02-05 MED ORDER — ONDANSETRON HCL 4 MG/2ML IJ SOLN
4.0000 mg | INTRAMUSCULAR | Status: DC | PRN
  Administered 2024-02-05 – 2024-02-06 (×3): 4 mg via INTRAVENOUS
  Filled 2024-02-05 (×3): qty 2

## 2024-02-05 MED ORDER — PROMETHAZINE HCL 25 MG PO TABS: 12.5000 mg | ORAL_TABLET | ORAL | Status: DC | PRN

## 2024-02-05 MED ORDER — DIPHENHYDRAMINE HCL 25 MG PO CAPS: 25.0000 mg | ORAL_CAPSULE | ORAL | Status: DC | PRN

## 2024-02-05 NOTE — ED Notes (Signed)
 ED TO INPATIENT HANDOFF REPORT  ED Nurse Name and Phone #: Joaquim [phone]  S Name/Age/Gender Austin [name] 33 y.o. male Room/Bed: WA07/WA07  Code Status   Code Status: Full Code  Home/SNF/Other Home Patient oriented to: self, place, time, and situation Is this baseline? Yes   Triage Complete: Triage complete  Chief Complaint Sickle cell disease with crisis (HCC) [D57.00]  Triage Note Pt comes in for chest pain, back pain and generalized pain that started at 3am. Home meds not helping     Allergies No Known Allergies  Level of Care/Admitting Diagnosis ED Disposition     ED Disposition  Admit   Condition  --   Comment  Hospital Area: [facility] [[id number]]  Level of Care: Med-Surg [16]  May admit patient to [facility] or [facility] if equivalent level of care is available:: No  Covid Evaluation: Asymptomatic - no recent exposure (last 10 days) testing not required  Diagnosis: Sickle cell disease with crisis [facility]) [[id number]]  Admitting Physician: SIM EMERY CROME [[id number]]  Attending Physician: SIM EMERY CROME [[id number]]  Certification:: I certify this patient will need inpatient services for at least 2 midnights  Expected Medical Readiness: 02/08/2024          B Medical/Surgery History Past Medical History:  Diagnosis Date   Acute maxillary sinusitis    Eczema    Sickle cell anemia (HCC) .   Past Surgical History:  Procedure Laterality Date   FOOT SURGERY Left 12/25/2020   TOOTH EXTRACTION N/A 11/17/2023   Procedure: DENTAL RESTORATION/EXTRACTIONS;  Surgeon: Sheryle Hamilton, DMD;  Location: [facility] OR;  Service: Oral Surgery;  Laterality: N/A;   WISDOM TOOTH EXTRACTION       A IV Location/Drains/Wounds Patient Lines/Drains/Airways Status     Active Line/Drains/Airways     Name Placement date Placement time Site Days   Peripheral IV 02/05/24 22 G 1 Left;Posterior Hand 02/05/24  1426  Hand  less than 1            Intake/Output  Last 24 hours No intake or output data in the 24 hours ending 02/05/24 1930  Labs/Imaging Results for orders placed or performed during the hospital encounter of 02/05/24 (from the past 48 hours)  Reticulocytes     Status: Abnormal   Collection Time: 02/05/24  2:30 PM  Result Value Ref Range   Retic Ct Pct 4.2 (H) 0.4 - 3.1 %   RBC. 4.61 4.22 - 5.81 MIL/uL   Retic Count, Absolute 192.2 (H) 19.0 - 186.0 K/uL   Immature Retic Fract 22.8 (H) 2.3 - 15.9 %    Comment: Performed at [facility], [address], [place], [place] [address]  Comprehensive metabolic panel     Status: Abnormal   Collection Time: 02/05/24  2:30 PM  Result Value Ref Range   Sodium 136 135 - 145 mmol/L   Potassium 4.1 3.5 - 5.1 mmol/L   Chloride 102 98 - 111 mmol/L   CO2 25 22 - 32 mmol/L   Glucose, Bld 91 70 - 99 mg/dL    Comment: Glucose reference range applies only to samples taken after fasting for at least 8 hours.   BUN 12 6 - 20 mg/dL   Creatinine, Ser 9.09 0.61 - 1.24 mg/dL   Calcium 9.4 8.9 - 89.6 mg/dL   Total Protein 8.2 (H) 6.5 - 8.1 g/dL   Albumin 4.6 3.5 - 5.0 g/dL   AST 13 (L) 15 - 41 U/L  ALT 30 0 - 44 U/L   Alkaline Phosphatase 57 38 - 126 U/L   Total Bilirubin 2.3 (H) 0.0 - 1.2 mg/dL   GFR, Estimated >39 >39 mL/min    Comment: (NOTE) Calculated using the CKD-EPI Creatinine Equation (2021)    Anion gap 9 5 - 15    Comment: Performed at [facility], [address], [place], [place] [address]  CBC WITH DIFFERENTIAL     Status: Abnormal   Collection Time: 02/05/24  2:30 PM  Result Value Ref Range   WBC 9.8 4.0 - 10.5 K/uL   RBC 4.69 4.22 - 5.81 MIL/uL   Hemoglobin 13.0 13.0 - 17.0 g/dL   HCT 62.7 (L) 60.9 - 47.9 %   MCV 79.3 (L) 80.0 - 100.0 fL   MCH 27.7 26.0 - 34.0 pg   MCHC 34.9 30.0 - 36.0 g/dL   RDW 85.8 88.4 - 84.4 %   Platelets 128 (L) 150 - 400 K/uL   nRBC 0.2 0.0 - 0.2 %   Neutrophils Relative % 67 %   Neutro Abs 6.4 1.7 - 7.7 K/uL    Lymphocytes Relative 22 %   Lymphs Abs 2.2 0.7 - 4.0 K/uL   Monocytes Relative 10 %   Monocytes Absolute 1.0 0.1 - 1.0 K/uL   Eosinophils Relative 1 %   Eosinophils Absolute 0.1 0.0 - 0.5 K/uL   Basophils Relative 0 %   Basophils Absolute 0.0 0.0 - 0.1 K/uL   Immature Granulocytes 0 %   Abs Immature Granulocytes 0.03 0.00 - 0.07 K/uL    Comment: Performed at [facility], [address] [place], [place] [address]  Troponin I (High Sensitivity)     Status: None   Collection Time: 02/05/24  2:30 PM  Result Value Ref Range   Troponin I (High Sensitivity) <2 <18 ng/L    Comment: (NOTE) Elevated high sensitivity troponin I (hsTnI) values and significant  changes across serial measurements may suggest ACS but many other  chronic and acute conditions are known to elevate hsTnI results.  Refer to the Links section for chest pain algorithms and additional  guidance. Performed at [facility], [address] [place], [place] [address]    DG Chest Port 1 View Result Date: 02/05/2024 CLINICAL DATA:  Chest and back pain.  History of sickle cell. EXAM: PORTABLE CHEST 1 VIEW COMPARISON:  06/28/2023. FINDINGS: The heart size and mediastinal contours are within normal limits. No consolidation, effusion, or pneumothorax is seen. No acute osseous abnormality. IMPRESSION: No active disease. Electronically Signed   By: Leita Birmingham M.D.   On: 02/05/2024 14:55    Pending Labs [employer] (From admission, onward)     Start     Ordered   Signed and Held  HIV Antibody (routine testing w rflx)  (HIV Antibody (Routine testing w reflex) panel)  Once,   R        Signed and Held   Signed and Held  CBC  (enoxaparin  (LOVENOX )    CrCl >/= 30 ml/min)  Once,   R       Comments: Baseline for enoxaparin  therapy IF NOT ALREADY DRAWN.  Notify MD if PLT < 100 K.    Signed and Held   Signed and Held  Creatinine, serum  (enoxaparin  (LOVENOX )    CrCl >/= 30 ml/min)  Once,    R       Comments: Baseline for enoxaparin  therapy IF NOT ALREADY DRAWN.  Signed and Held   Signed and Held  Creatinine, serum  (enoxaparin  (LOVENOX )    CrCl >/= 30 ml/min)  Weekly,   R     Comments: while on enoxaparin  therapy    Signed and Held   Signed and Held  Comprehensive metabolic panel  Tomorrow morning,   R        Signed and Held   Signed and Held  CBC with Differential/Platelet  Tomorrow morning,   R        Signed and Held            Vitals/Pain Today's Vitals   02/05/24 1506 02/05/24 1615 02/05/24 1617 02/05/24 1711  BP:      Pulse:      Resp:      Temp:    97.7 F (36.5 C)  TempSrc:    Oral  SpO2:      PainSc: 7  7  7       Isolation Precautions No active isolations  Medications Medications  ondansetron  (ZOFRAN ) injection 4 mg (4 mg Intravenous Given 02/05/24 1433)  HYDROmorphone  (DILAUDID ) injection 2 mg (2 mg Intravenous Given 02/05/24 1427)  HYDROmorphone  (DILAUDID ) injection 2 mg (2 mg Intravenous Given 02/05/24 1506)  HYDROmorphone  (DILAUDID ) injection 2 mg (2 mg Intravenous Given 02/05/24 1617)    Mobility walks     Focused Assessments    R Recommendations: See Admitting Provider Note  Report given to:   Additional Notes:  '

## 2024-02-05 NOTE — H&P (Signed)
 History and Physical    Patient: Austin [name] FMW:[id number] DOB: 1990-11-07 DOA: 02/05/2024 DOS: the patient was seen and examined on 02/05/2024 PCP: Oley Bascom RAMAN, NP  Patient coming from: Home  Chief Complaint:  Chief Complaint  Patient presents with   Sickle Cell Pain Crisis   HPI: Austin [name] is a 33 y.o. male with medical history significant of sickle cell disease, anemia of chronic disease, chronic pain syndrome, who presented to the ER with pain in his leg and back for the last 2 days.  Pain is consistent with his typical sickle cell crisis.  Patient has taken his home oxycodone  but no relief.  He came to the ER where he was seen and evaluated.  So far he has had 6 mg of IV Dilaudid  in the ER but the pain has persisted.  Patient's vitals and blood work however is unchanged.  Patient has not has multiple admissions lately.  Pain is uncontrolled so he will be admitted for further treatment.  Review of Systems: As mentioned in the history of present illness. All other systems reviewed and are negative. Past Medical History:  Diagnosis Date   Acute maxillary sinusitis    Eczema    Sickle cell anemia (HCC) .   Past Surgical History:  Procedure Laterality Date   FOOT SURGERY Left 12/25/2020   TOOTH EXTRACTION N/A 11/17/2023   Procedure: DENTAL RESTORATION/EXTRACTIONS;  Surgeon: Sheryle Hamilton, DMD;  Location: [facility] OR;  Service: Oral Surgery;  Laterality: N/A;   WISDOM TOOTH EXTRACTION     Social History:  reports that he quit smoking about 10 years ago. His smoking use included cigarettes. He has never used smokeless tobacco. He reports current alcohol use of about 1.0 standard drink of alcohol per week. He reports current drug use. Drug: Marijuana.  No Known Allergies  Family History  Problem Relation Age of Onset   Sickle cell trait Mother    Diabetes Father    Hypertension Father     Prior to Admission medications   Medication Sig Start Date End Date Taking?  Authorizing Provider  ibuprofen  (ADVIL ) 800 MG tablet Take 1 tablet (800 mg total) by mouth every 8 (eight) hours as needed (pain). 12/13/23   Vonna Sharlet POUR, MD  Oxycodone  HCl 10 MG TABS Take 1 tablet (10 mg total) by mouth every 6 (six) hours as needed. 01/23/24   Nichols, Tonya S, NP  penicillin v potassium (VEETID) 500 MG tablet Take 500 mg by mouth 4 (four) times daily. Patient not taking: Reported on 11/24/2023 10/27/23   [provider]  promethazine  (PHENERGAN ) 25 MG tablet Take 1 tablet (25 mg total) by mouth every 6 (six) hours as needed for nausea or vomiting. 01/20/24   Nichols, Tonya S, NP  tadalafil (CIALIS) 5 MG tablet Take 5 mg by mouth daily as needed for erectile dysfunction. 08/29/23   [provider]  tiZANidine  (ZANAFLEX ) 4 MG tablet Take 1 tablet (4 mg total) by mouth every 8 (eight) hours as needed for muscle spasms. 12/13/23   Vonna Sharlet POUR, MD  Vitamin D , Ergocalciferol , (DRISDOL ) 1.25 MG (50000 UNIT) CAPS capsule Take 1 capsule (50,000 Units total) by mouth every 7 (seven) days. 08/29/23   Oley Bascom RAMAN, NP    Physical Exam: Vitals:   02/05/24 1244 02/05/24 1426 02/05/24 1711  BP: 127/89 118/77   Pulse: 81 68   Resp: 18 18   Temp: 97.8 F (36.6 C)  97.7 F (36.5 C)  TempSrc: Oral  Oral  SpO2: 100% 100%    Constitutional: Acutely ill looking NAD, calm, comfortable Eyes: PERRL, lids and conjunctivae normal ENMT: Mucous membranes are moist. Posterior pharynx clear of any exudate or lesions.Normal dentition.  Neck: normal, supple, no masses, no thyromegaly Respiratory: clear to auscultation bilaterally, no wheezing, no crackles. Normal respiratory effort. No accessory muscle use.  Cardiovascular: Regular rate and rhythm, no murmurs / rubs / gallops. No extremity edema. 2+ pedal pulses. No carotid bruits.  Abdomen: no tenderness, no masses palpated. No hepatosplenomegaly. Bowel sounds positive.  Musculoskeletal: Good range of motion, no joint  swelling or tenderness, Skin: no rashes, lesions, ulcers. No induration Neurologic: CN 2-12 grossly intact. Sensation intact, DTR normal. Strength 5/5 in all 4.  Psychiatric: Normal judgment and insight. Alert and oriented x 3. Normal mood  Data Reviewed:  Vitals are stable, platelets 128, hemoglobin 13.0.  AST 13 total protein 8.2 total bilirubin 2.3 with reticulocyte percent is 4.2.  Chest x-ray showed no active disease  Assessment and Plan:  #1 sickle cell pain crisis: Patient will be admitted.  Initiate Dilaudid  PCA, Toradol  and IV fluids.  Continue supportive care.  #2 anemia of chronic disease: Continue to monitor H&H.  #3 chronic pain syndrome: Continue oral oxycodone .  Continue to monitor.  Diarrhea    Advance Care Planning:   Code Status: Full Code   Consults: None  Family Communication: No family at bedside  Severity of Illness: The appropriate patient status for this patient is INPATIENT. Inpatient status is judged to be reasonable and necessary in order to provide the required intensity of service to ensure the patient's safety. The patient's presenting symptoms, physical exam findings, and initial radiographic and laboratory data in the context of their chronic comorbidities is felt to place them at high risk for further clinical deterioration. Furthermore, it is not anticipated that the patient will be medically stable for discharge from the hospital within 2 midnights of admission.   * I certify that at the point of admission it is my clinical judgment that the patient will require inpatient hospital care spanning beyond 2 midnights from the point of admission due to high intensity of service, high risk for further deterioration and high frequency of surveillance required.*  AuthorBETHA SIM KNOLL, MD 02/05/2024 5:37 PM  For on call review [email or web address].

## 2024-02-05 NOTE — ED Triage Notes (Signed)
 Pt comes in for chest pain, back pain and generalized pain that started at 3am. Home meds not helping

## 2024-02-05 NOTE — ED Provider Notes (Signed)
 [name] EMERGENCY DEPARTMENT AT [facility] Provider Note   CSN: [id number] Arrival date & time: 02/05/24  1238     Patient presents with: Sickle Cell Pain Crisis   Austin [name] is a 33 y.o. male with past medical history significant for sickle cell anemia who presents concern for chest pain, back pain, generalized pain started around 3 AM.  No improvement with home oxycodone .  Reports that this overall feels like his normal sickle cell pain although the achy chest pain is not normal for him.  Denies cough, shortness of breath, fever, chills.  {Add pertinent medical, surgical, social history, OB history to YEP:67052}  Sickle Cell Pain Crisis      Prior to Admission medications   Medication Sig Start Date End Date Taking? Authorizing Provider  ibuprofen  (ADVIL ) 800 MG tablet Take 1 tablet (800 mg total) by mouth every 8 (eight) hours as needed (pain). 12/13/23   Vonna Sharlet POUR, MD  Oxycodone  HCl 10 MG TABS Take 1 tablet (10 mg total) by mouth every 6 (six) hours as needed. 01/23/24   Nichols, Tonya S, NP  penicillin v potassium (VEETID) 500 MG tablet Take 500 mg by mouth 4 (four) times daily. Patient not taking: Reported on 11/24/2023 10/27/23   [provider]  promethazine  (PHENERGAN ) 25 MG tablet Take 1 tablet (25 mg total) by mouth every 6 (six) hours as needed for nausea or vomiting. 01/20/24   Nichols, Tonya S, NP  tadalafil (CIALIS) 5 MG tablet Take 5 mg by mouth daily as needed for erectile dysfunction. 08/29/23   [provider]  tiZANidine  (ZANAFLEX ) 4 MG tablet Take 1 tablet (4 mg total) by mouth every 8 (eight) hours as needed for muscle spasms. 12/13/23   Vonna Sharlet POUR, MD  Vitamin D , Ergocalciferol , (DRISDOL ) 1.25 MG (50000 UNIT) CAPS capsule Take 1 capsule (50,000 Units total) by mouth every 7 (seven) days. 08/29/23   Nichols, Tonya S, NP    Allergies: Patient has no known allergies.    Review of Systems  All other systems reviewed  and are negative.   Updated Vital Signs BP 127/89 (BP Location: Left Arm)   Pulse 81   Temp 97.8 F (36.6 C) (Oral)   Resp 18   SpO2 100%   Physical Exam Vitals and nursing note reviewed.  Constitutional:      General: He is not in acute distress.    Appearance: Normal appearance.  HENT:     Head: Normocephalic and atraumatic.  Eyes:     General:        Right eye: No discharge.        Left eye: No discharge.  Cardiovascular:     Rate and Rhythm: Normal rate and regular rhythm.     Heart sounds: No murmur heard.    No friction rub. No gallop.  Pulmonary:     Effort: Pulmonary effort is normal.     Breath sounds: Normal breath sounds.     Comments: No wheezing, rhonchi, stridor, rales Abdominal:     General: Bowel sounds are normal.     Palpations: Abdomen is soft.  Skin:    General: Skin is warm and dry.     Capillary Refill: Capillary refill takes less than 2 seconds.  Neurological:     Mental Status: He is alert and oriented to person, place, and time.  Psychiatric:        Mood and Affect: Mood normal.  Behavior: Behavior normal.     (all labs ordered are listed, but only abnormal results are displayed) Labs Reviewed  RETICULOCYTES  COMPREHENSIVE METABOLIC PANEL WITH GFR  CBC WITH DIFFERENTIAL/PLATELET  TROPONIN I (HIGH SENSITIVITY)    EKG: EKG Interpretation Date/Time:  Sunday February 05 2024 12:54:32 EDT Ventricular Rate:  76 PR Interval:  124 QRS Duration:  91 QT Interval:  351 QTC Calculation: 395 R Axis:   77  Text Interpretation: Sinus rhythm similar to prior no stemi Confirmed by Elnor Savant ([id number]) on 02/05/2024 12:57:40 PM  Radiology: No results found.  {Document cardiac monitor, telemetry assessment procedure when appropriate:32947} Procedures   Medications Ordered in the ED  HYDROmorphone  (DILAUDID ) injection 2 mg (has no administration in time range)  HYDROmorphone  (DILAUDID ) injection 2 mg (has no administration in time range)   HYDROmorphone  (DILAUDID ) injection 2 mg (has no administration in time range)  ondansetron  (ZOFRAN ) injection 4 mg (has no administration in time range)      {Click here for ABCD2, HEART and other calculators REFRESH Note before signing:1}                              Medical Decision Making Amount and/or Complexity of Data Reviewed Labs: ordered. Radiology: ordered.  Risk Prescription drug management.   This patient is a 33 y.o. male  who presents to the ED for concern of sickle cell pain crisis, chest pain.   Differential diagnoses prior to evaluation: The emergent differential diagnosis includes, but is not limited to,  ACS, AAS, PE, Mallory-Weiss, Boerhaave's, Pneumonia, acute bronchitis, asthma or COPD exacerbation, anxiety, MSK pain or traumatic injury to the chest, acid reflux versus other, sickle cell, acute chest syndrome, vs other . This is not an exhaustive differential.   Past Medical History / Co-morbidities / Social History: Sickle cell disease  Additional history: Chart reviewed. Pertinent results include: reviewed labwork, imaging  Physical Exam: Physical exam performed. The pertinent findings include: overall well appearing, stable vital signs, no wheezing, rhonchi, stridor throughout  Lab Tests/Imaging studies: I personally interpreted labs/imaging and the pertinent results include: CMP overall unremarkable, mildly elevated total bilirubin, suspect secondary to blood product breakdown, CBC mildly low platelets at 128, no anemia, negative troponin x 1, delta pending, elevated reticulocytes, no in acute aplastic crisis. Plain film chest xray with no infiltrate or other acute abnormality. I agree with the radiologist interpretation.  Cardiac monitoring: EKG obtained and interpreted by myself and attending physician which shows: NSR, no stemi   Medications: I ordered medication including dilaudid  for pain, zofran  for nausea.  I have reviewed the patients home  medicines and have made adjustments as needed.  Consults:    Disposition: After consideration of the diagnostic results and the patients response to treatment, I feel that *** .   ***emergency department workup does not suggest an emergent condition requiring admission or immediate intervention beyond what has been performed at this time. The plan is: ***. The patient is safe for discharge and has been instructed to return immediately for worsening symptoms, change in symptoms or any other concerns.   Final diagnoses:  None    ED Discharge Orders     None

## 2024-02-06 ENCOUNTER — Other Ambulatory Visit: Payer: Self-pay | Admitting: Nurse Practitioner

## 2024-02-06 ENCOUNTER — Other Ambulatory Visit (HOSPITAL_COMMUNITY): Payer: Self-pay

## 2024-02-06 DIAGNOSIS — D5700 Hb-SS disease with crisis, unspecified: Secondary | ICD-10-CM

## 2024-02-06 LAB — COMPREHENSIVE METABOLIC PANEL WITH GFR
ALT: 29 U/L (ref 0–44)
AST: 11 U/L — ABNORMAL LOW (ref 15–41)
Albumin: 4.7 g/dL (ref 3.5–5.0)
Alkaline Phosphatase: 58 U/L (ref 38–126)
Anion gap: 10 (ref 5–15)
BUN: 12 mg/dL (ref 6–20)
CO2: 24 mmol/L (ref 22–32)
Calcium: 9.3 mg/dL (ref 8.9–10.3)
Chloride: 102 mmol/L (ref 98–111)
Creatinine, Ser: 0.85 mg/dL (ref 0.61–1.24)
GFR, Estimated: >60 mL/min (ref >60)
Glucose, Bld: 100 mg/dL — ABNORMAL HIGH (ref 70–99)
Potassium: 3.6 mmol/L (ref 3.5–5.1)
Sodium: 136 mmol/L (ref 135–145)
Total Bilirubin: 2.6 mg/dL — ABNORMAL HIGH (ref 0.0–1.2)
Total Protein: 8.3 g/dL — ABNORMAL HIGH (ref 6.5–8.1)

## 2024-02-06 LAB — CBC WITH DIFFERENTIAL/PLATELET
Abs Immature Granulocytes: 0.02 K/uL (ref 0.00–0.07)
Basophils Absolute: 0 K/uL (ref 0.0–0.1)
Basophils Relative: 0 %
Eosinophils Absolute: 0.1 K/uL (ref 0.0–0.5)
Eosinophils Relative: 1 %
HCT: 37.6 % — ABNORMAL LOW (ref 39.0–52.0)
Hemoglobin: 12.9 g/dL — ABNORMAL LOW (ref 13.0–17.0)
Immature Granulocytes: 0 %
Lymphocytes Relative: 29 %
Lymphs Abs: 2.7 K/uL (ref 0.7–4.0)
MCH: 28 pg (ref 26.0–34.0)
MCHC: 34.3 g/dL (ref 30.0–36.0)
MCV: 81.7 fL (ref 80.0–100.0)
Monocytes Absolute: 1.2 K/uL — ABNORMAL HIGH (ref 0.1–1.0)
Monocytes Relative: 12 %
Neutro Abs: 5.3 K/uL (ref 1.7–7.7)
Neutrophils Relative %: 58 %
Platelets: 103 K/uL — ABNORMAL LOW (ref 150–400)
RBC: 4.6 MIL/uL (ref 4.22–5.81)
RDW: 14 % (ref 11.5–15.5)
WBC: 9.3 K/uL (ref 4.0–10.5)
nRBC: 0.3 % — ABNORMAL HIGH (ref 0.0–0.2)

## 2024-02-06 LAB — HIV ANTIBODY (ROUTINE TESTING W REFLEX): HIV Screen 4th Generation wRfx: NONREACTIVE

## 2024-02-06 MED ORDER — OXYCODONE HCL 10 MG PO TABS
10.0000 mg | ORAL_TABLET | Freq: Four times a day (QID) | ORAL | 0 refills | Status: DC | PRN
  Filled 2024-02-06: qty 60, 15d supply, fill #0

## 2024-02-06 NOTE — Progress Notes (Signed)
   02/06/24 0915  TOC Brief Assessment  Insurance and Status Reviewed  Patient has primary care physician Yes  Home environment has been reviewed apartment  Prior level of function: independent  Prior/Current Home Services No current home services  Social Drivers of Health Review SDOH reviewed no interventions necessary  Readmission risk has been reviewed Yes  Transition of care needs no transition of care needs at this time    Heather Saltness, MSW, LCSW 02/06/2024 9:15 AM

## 2024-02-06 NOTE — Plan of Care (Signed)
  Problem: Health Behavior/Discharge Planning: Goal: Ability to manage health-related needs will improve Outcome: Progressing   Problem: Pain Managment: Goal: General experience of comfort will improve and/or be controlled Outcome: Progressing   Problem: Education: Goal: Awareness of infection prevention will improve Outcome: Progressing   Problem: Self-Care: Goal: Ability to incorporate actions that prevent/reduce pain crisis will improve Outcome: Progressing   Problem: Tissue Perfusion: Goal: Complications related to inadequate tissue perfusion will be avoided or minimized Outcome: Progressing   Problem: Respiratory: Goal: Pulmonary complications will be avoided or minimized Outcome: Progressing Goal: Acute Chest Syndrome will be identified early to prevent complications Outcome: Progressing   Problem: Sensory: Goal: Pain level will decrease with appropriate interventions Outcome: Progressing

## 2024-02-06 NOTE — Progress Notes (Signed)
 Patient ID: Austin [name], male   DOB: [birth year], 33 y.o.   MRN: [id number] Subjective:  Austin [name] is a 33 y.o. male with medical history significant of sickle cell disease, anemia of chronic disease, chronic pain syndrome, who presented to the ER with pain in his leg and back for the last 2 days.  Pain is consistent with his typical sickle cell crisis.   Patient is reporting significant improvement to pain today at 2/10.  Patient asked to be discharged and later decided to stay for another day.  He has no new concerns.  Denies nausea, vomiting, headache, shortness of breath, cough, fever, no urinary symptoms.  Objective:  Vital signs in last 24 hours:  Vitals:   02/06/24 0801 02/06/24 1015 02/06/24 1200 02/06/24 1317  BP:  118/76  119/87  Pulse:  68  78  Resp: 10 16 16 18   Temp:  98.2 F (36.8 C)  98.4 F (36.9 C)  TempSrc:  Oral    SpO2:  100%  100%    Intake/Output from previous day:   Intake/Output Summary (Last 24 hours) at 02/06/2024 1337 Last data filed at 02/06/2024 1208 Gross per 24 hour  Intake 750 ml  Output 300 ml  Net 450 ml    Physical Exam: General: Alert, awake, oriented x3, in no acute distress.  HEENT: [place]/AT PEERL, EOMI Neck: Trachea midline,  no masses, no thyromegal,y no JVD, no carotid bruit OROPHARYNX:  Moist, No exudate/ erythema/lesions.  Heart: Regular rate and rhythm, without murmurs, rubs, gallops, PMI non-displaced, no heaves or thrills on palpation.  Lungs: Clear to auscultation, no wheezing or rhonchi noted. No increased vocal fremitus resonant to percussion  Abdomen: Soft, nontender, nondistended, positive bowel sounds, no masses no hepatosplenomegaly noted..  Neuro: No focal neurological deficits noted cranial nerves II through XII grossly intact. DTRs 2+ bilaterally upper and lower extremities. Strength 5 out of 5 in bilateral upper and lower extremities. Musculoskeletal: Generalized body tenderness.   Psychiatric: Patient alert  and oriented x3, good insight and cognition, good recent to remote recall. Lymph node survey: No cervical axillary or inguinal lymphadenopathy noted.  Lab Results:  Basic Metabolic Panel:    Component Value Date/Time   NA 136 02/06/2024 0535   NA 139 11/24/2023 1123   K 3.6 02/06/2024 0535   CL 102 02/06/2024 0535   CO2 24 02/06/2024 0535   BUN 12 02/06/2024 0535   BUN 9 11/24/2023 1123   CREATININE 0.85 02/06/2024 0535   GLUCOSE 100 (H) 02/06/2024 0535   CALCIUM 9.3 02/06/2024 0535   CBC:    Component Value Date/Time   WBC 9.3 02/06/2024 0535   HGB 12.9 (L) 02/06/2024 0535   HGB 13.7 11/24/2023 1123   HCT 37.6 (L) 02/06/2024 0535   HCT 42.5 11/24/2023 1123   PLT 103 (L) 02/06/2024 0535   PLT 184 11/24/2023 1123   MCV 81.7 02/06/2024 0535   MCV 87 11/24/2023 1123   NEUTROABS 5.3 02/06/2024 0535   NEUTROABS 6.8 11/24/2023 1123   LYMPHSABS 2.7 02/06/2024 0535   LYMPHSABS 3.5 (H) 11/24/2023 1123   MONOABS 1.2 (H) 02/06/2024 0535   EOSABS 0.1 02/06/2024 0535   EOSABS 0.3 11/24/2023 1123   BASOSABS 0.0 02/06/2024 0535   BASOSABS 0.0 11/24/2023 1123    No results found for this or any previous visit (from the past 240 hours).  Studies/Results: DG Chest Port 1 View Result Date: 02/05/2024 CLINICAL DATA:  Chest and back pain.  History of sickle cell.  EXAM: PORTABLE CHEST 1 VIEW COMPARISON:  06/28/2023. FINDINGS: The heart size and mediastinal contours are within normal limits. No consolidation, effusion, or pneumothorax is seen. No acute osseous abnormality. IMPRESSION: No active disease. Electronically Signed   By: Leita Birmingham M.D.   On: 02/05/2024 14:55    Medications: Scheduled Meds:  enoxaparin  (LOVENOX ) injection  40 mg Subcutaneous Q24H   HYDROmorphone    Intravenous Q4H   ketorolac   15 mg Intravenous Q6H   senna-docusate  1 tablet Oral BID   Continuous Infusions: PRN Meds:.diphenhydrAMINE , naloxone  **AND** sodium chloride  flush, ondansetron , oxyCODONE ,  polyethylene glycol, promethazine  **OR** promethazine , tiZANidine   Consultants: None  Procedures: None  Antibiotics: None  Assessment/Plan: Active Problems:   Thrombocytopenia (HCC)   Chronic pain syndrome   Anemia of chronic disease   Leukocytosis   Sickle cell disease with crisis (HCC)   Hb Sickle Cell Disease with Pain crisis: Continue IVF 0.45% Saline @ 125 mls/hour, continue weight based Dilaudid  PCA, IV Toradol  15 mg Q 6 H for a total of 5 days, continue oral home pain medications as ordered. Monitor vitals very closely, Re-evaluate pain scale regularly, 2 L of Oxygen  by . Patient encouraged to ambulate on the hallway today.  Leukocytosis: Stable Anemia of Chronic Disease: Hemoglobin 12.9 g/dL, slightly lower than patient's baseline.  No need for transfusion at this time.  Will continue to monitor daily CBC. Chronic pain Syndrome: Continue oral home pain medication.   Code Status: Full Code Family Communication: N/A Disposition Plan: Not yet ready for discharge  Homer CHRISTELLA Cover NP  If 7PM-7AM, please contact night-coverage.  02/06/2024, 1:37 PM  LOS: 1 day

## 2024-02-06 NOTE — Telephone Encounter (Signed)
Unable to pend medication(s)

## 2024-02-06 NOTE — Discharge Summary (Cosign Needed)
 Erroneous encounter

## 2024-02-06 NOTE — Telephone Encounter (Signed)
 Copied from CRM [id number]. Topic: Clinical - Medication Refill >> Feb 06, 2024 11:13 AM Selinda RAMAN wrote: Medication: oxyCODONE  (Oxy IR/ROXICODONE ) immediate release tablet 10 mg, promethazine  (PHENERGAN ) tablet 12.5-25 mg  Has the patient contacted their pharmacy? No   This is the patient's preferred pharmacy:  [facility] [address] [place] [address] Phone: [phone] Fax: [phone]  Is this the correct pharmacy for this prescription? Yes If no, delete pharmacy and type the correct one.   Has the prescription been filled recently? No  Is the patient out of the medication? No but he only has 1 pill left  Has the patient been seen for an appointment in the last year OR does the patient have an upcoming appointment? Yes  Can we respond through MyChart? Yes  Please assist patient further as he is being discharged today and needs these meds to help with his sickle cell

## 2024-02-07 ENCOUNTER — Other Ambulatory Visit: Payer: Self-pay | Admitting: Nurse Practitioner

## 2024-02-07 ENCOUNTER — Other Ambulatory Visit (HOSPITAL_COMMUNITY): Payer: Self-pay

## 2024-02-07 DIAGNOSIS — R110 Nausea: Secondary | ICD-10-CM

## 2024-02-07 MED ORDER — PROMETHAZINE HCL 25 MG PO TABS
25.0000 mg | ORAL_TABLET | Freq: Four times a day (QID) | ORAL | 0 refills | Status: DC | PRN
  Filled 2024-02-07: qty 20, 5d supply, fill #0

## 2024-02-07 NOTE — Plan of Care (Signed)

## 2024-02-07 NOTE — Progress Notes (Signed)
 10ml of Hydromorphone  PCA Syringe discarded and witnessed by Corean Seltzer.

## 2024-02-07 NOTE — Discharge Summary (Signed)
 Physician Discharge Summary  KYE HEDDEN FMW:[id number] DOB: 21-Dec-1990 DOA: 02/05/2024  PCP: Oley Bascom RAMAN, NP  Admit date: 02/05/2024  Discharge date: 02/07/2024  Discharge Diagnoses:  Active Problems:   Thrombocytopenia (HCC)   Chronic pain syndrome   Anemia of chronic disease   Leukocytosis   Sickle cell disease with crisis [facility])   Discharge Condition: Stable  Disposition:  Pt is discharged home in good condition and is to follow up with Oley Bascom RAMAN, NP this week to have labs evaluated. [place] Dorsey is instructed to increase activity slowly and balance with rest for the next few days, and use prescribed medication to complete treatment of pain  Diet: Regular Wt Readings from Last 3 Encounters:  12/13/23 85.7 kg  11/24/23 84.8 kg  11/18/23 83.9 kg    History of present illness:  Austin [name] is a 33 y.o. male with medical history significant for sickle cell disease, anemia of chronic disease, chronic pain syndrome, who presented to the ER with pain in his leg and back for the last 2 days.  Pain is consistent with his typical sickle cell crisis.  Patient took his home oxycodone  without relief.  He went to the ER where he was seen and evaluated. Denies fever, cough, nausea, vomiting, diarrhea.  No urinary symptoms.  No recent travels or sick contacts.  ED Course:  Patient was treated in the emergency department with IV hydration, 6 mg of IV Dilaudid  but the pain persisted.  Patient's vitals and blood work however is unchanged.  Patient's pain remains uncontrolled. He is admitted for ongoing sickle cell pain management. BP 118/76 (BP Location: Right Arm)  Pulse 68  Temp 98.2 F (36.8 C) (Oral)  Resp 16  SpO2 100%  Reticulocyte count 4.2, total protein 8.2, AST 13, total bilirubin 2.3, HCT 37.2, MCV 79.3, platelet 128,  Hospital Course:  Patient was admitted for sickle cell pain crisis and managed appropriately with IVF, IV Dilaudid  via PCA and IV  Toradol , as well as other adjunct therapies per sickle cell pain management protocols.  Patient is reporting significant improvement to pain at 0/10.  He is tolerating p.o. without nausea or vomiting, ambulating without assistance. Patient asked to be discharged home. Patient was therefore discharged home today in a hemodynamically stable condition.   [place] will follow-up with PCP within 1 week of this discharge. Ahmari was counseled extensively about nonpharmacologic means of pain management, patient verbalized understanding and was appreciative of  the care received during this admission.   We discussed the need for good hydration, monitoring of hydration status, avoidance of heat, cold, stress, and infection triggers. We discussed the need to be adherent with taking other home medications. Patient was reminded of the need to seek medical attention immediately if any symptom of bleeding, anemia, or infection occurs.  Discharge Exam: Vitals:   02/07/24 0918 02/07/24 1122  BP: 125/87   Pulse: 89   Resp: 20 15  Temp: 98.6 F (37 C)   SpO2: 100% 100%   Vitals:   02/07/24 0453 02/07/24 0814 02/07/24 0918 02/07/24 1122  BP: 116/73  125/87   Pulse: 82  89   Resp: 11 12 20 15   Temp: 98.3 F (36.8 C)  98.6 F (37 C)   TempSrc: Oral  Oral   SpO2: 100% 100% 100% 100%    General appearance : Awake, alert, not in any distress. Speech Clear. Not toxic looking HEENT: Atraumatic and Normocephalic, pupils equally reactive to light and accomodation  Neck: Supple, no JVD. No cervical lymphadenopathy.  Chest: Good air entry bilaterally, no added sounds  CVS: S1 S2 regular, no murmurs.  Abdomen: Bowel sounds present, Non tender and not distended with no gaurding, rigidity or rebound. Extremities: B/L Lower Ext shows no edema, both legs are warm to touch Neurology: Awake alert, and oriented X 3, CN II-XII intact, Non focal Skin: No Rash  Discharge Instructions  Discharge Instructions     Call MD  for:  severe uncontrolled pain   Complete by: As directed    Call MD for:  severe uncontrolled pain   Complete by: As directed    Call MD for:  temperature >100.4   Complete by: As directed    Call MD for:  temperature >100.4   Complete by: As directed    Diet - low sodium heart healthy   Complete by: As directed    Diet - low sodium heart healthy   Complete by: As directed    Increase activity slowly   Complete by: As directed    Increase activity slowly   Complete by: As directed       Allergies as of 02/07/2024   No Known Allergies      Medication List     STOP taking these medications    penicillin v potassium 500 MG tablet Commonly known as: VEETID       TAKE these medications    ibuprofen  800 MG tablet Commonly known as: ADVIL  Take 1 tablet (800 mg total) by mouth every 8 (eight) hours as needed (pain).   Oxycodone  HCl 10 MG Tabs Take 1 tablet (10 mg total) by mouth every 6 (six) hours as needed. What changed: reasons to take this   tadalafil 5 MG tablet Commonly known as: CIALIS Take 5 mg by mouth daily as needed for erectile dysfunction.   tiZANidine  4 MG tablet Commonly known as: Zanaflex  Take 1 tablet (4 mg total) by mouth every 8 (eight) hours as needed for muscle spasms.   Vitamin D  (Ergocalciferol ) 1.25 MG (50000 UNIT) Caps capsule Commonly known as: DRISDOL  Take 1 capsule (50,000 Units total) by mouth every 7 (seven) days.        The results of significant diagnostics from this hospitalization (including imaging, microbiology, ancillary and laboratory) are listed below for reference.    Significant Diagnostic Studies: DG Chest Port 1 View Result Date: 02/05/2024 CLINICAL DATA:  Chest and back pain.  History of sickle cell. EXAM: PORTABLE CHEST 1 VIEW COMPARISON:  06/28/2023. FINDINGS: The heart size and mediastinal contours are within normal limits. No consolidation, effusion, or pneumothorax is seen. No acute osseous abnormality.  IMPRESSION: No active disease. Electronically Signed   By: Leita Birmingham M.D.   On: 02/05/2024 14:55    Microbiology: No results found for this or any previous visit (from the past 240 hours).   Labs: Basic Metabolic Panel: Recent Labs  Lab 02/05/24 1430 02/06/24 0535  NA 136 136  K 4.1 3.6  CL 102 102  CO2 25 24  GLUCOSE 91 100*  BUN 12 12  CREATININE 0.90 0.85  CALCIUM 9.4 9.3   Liver Function Tests: Recent Labs  Lab 02/05/24 1430 02/06/24 0535  AST 13* 11*  ALT 30 29  ALKPHOS 57 58  BILITOT 2.3* 2.6*  PROT 8.2* 8.3*  ALBUMIN 4.6 4.7   No results for input(s): LIPASE, AMYLASE in the last 168 hours. No results for input(s): AMMONIA in the last 168 hours. CBC: Recent Labs  Lab 02/05/24 1430 02/06/24 0535  WBC 9.8 9.3  NEUTROABS 6.4 5.3  HGB 13.0 12.9*  HCT 37.2* 37.6*  MCV 79.3* 81.7  PLT 128* 103*   Cardiac Enzymes: No results for input(s): CKTOTAL, CKMB, CKMBINDEX, TROPONINI in the last 168 hours. BNP: Invalid input(s): POCBNP CBG: No results for input(s): GLUCAP in the last 168 hours.  Time coordinating discharge: 50 minutes  Signed:  Homer CHRISTELLA Cover NP  [facility] 02/07/2024, 12:43 PM

## 2024-02-22 ENCOUNTER — Other Ambulatory Visit (HOSPITAL_COMMUNITY): Payer: Self-pay

## 2024-02-22 ENCOUNTER — Other Ambulatory Visit: Payer: Self-pay | Admitting: Nurse Practitioner

## 2024-02-22 ENCOUNTER — Other Ambulatory Visit: Payer: Self-pay

## 2024-02-22 DIAGNOSIS — D5700 Hb-SS disease with crisis, unspecified: Secondary | ICD-10-CM

## 2024-02-22 DIAGNOSIS — R110 Nausea: Secondary | ICD-10-CM

## 2024-02-22 MED ORDER — OXYCODONE HCL 10 MG PO TABS
10.0000 mg | ORAL_TABLET | Freq: Four times a day (QID) | ORAL | 0 refills | Status: DC | PRN
  Filled 2024-02-22: qty 60, 15d supply, fill #0

## 2024-02-22 MED ORDER — PROMETHAZINE HCL 25 MG PO TABS
25.0000 mg | ORAL_TABLET | Freq: Four times a day (QID) | ORAL | 0 refills | Status: DC | PRN
Start: 2024-02-22 — End: 2024-03-08
  Filled 2024-02-22: qty 20, 5d supply, fill #0

## 2024-02-22 NOTE — Telephone Encounter (Signed)
 Please advise [facility]

## 2024-02-24 ENCOUNTER — Ambulatory Visit: Payer: Self-pay | Admitting: Nurse Practitioner

## 2024-02-24 ENCOUNTER — Encounter: Payer: Self-pay | Admitting: Nurse Practitioner

## 2024-02-24 VITALS — BP 117/76 | HR 89 | Temp 98.1°F | Wt 192.0 lb

## 2024-02-24 DIAGNOSIS — D571 Sickle-cell disease without crisis: Secondary | ICD-10-CM

## 2024-02-24 DIAGNOSIS — Z113 Encounter for screening for infections with a predominantly sexual mode of transmission: Secondary | ICD-10-CM

## 2024-02-24 NOTE — Progress Notes (Signed)
 Subjective   Patient ID: Austin [name], male    DOB: [birth year], 33 y.o.   MRN: [id number]  Chief Complaint  Patient presents with   Medical Management of Chronic Issues    Referring provider: Oley Bascom RAMAN, NP  Austin [name] is a 33 y.o. male with Past Medical History: No date: Acute maxillary sinusitis No date: Eczema .: Sickle cell anemia (HCC)   HPI    Patient presents today for a follow-up visit. This is a patient of Dr. Jegede. He has had several ED visits for sickle cell crisis over the past few months. Patient usually takes oxycodone  HCL 10 mg for sickle cell pain. Denies f/c/s, n/v/d, hemoptysis, PND, leg swelling. Denies chest pain or edema.     No Known Allergies  Immunization History  Administered Date(s) Administered   Influenza,inj,Quad PF,6+ Mos 03/13/2018   Influenza-Unspecified 04/25/2020   PFIZER(Purple Top)SARS-COV-2 Vaccination 04/04/2020, 04/25/2020   Tdap 08/27/2015, 04/11/2023    Tobacco History: Social History   Tobacco Use  Smoking Status Former   Current packs/day: 0.00   Types: Cigarettes   Quit date: 11/23/2013   Years since quitting: 10.2  Smokeless Tobacco Never   Counseling given: Not Answered   Outpatient Encounter Medications as of 02/24/2024  Medication Sig   Oxycodone  HCl 10 MG TABS Take 1 tablet (10 mg total) by mouth every 6 (six) hours as needed.   promethazine  (PHENERGAN ) 25 MG tablet Take 1 tablet (25 mg total) by mouth every 6 (six) hours as needed for nausea or vomiting.   tadalafil (CIALIS) 5 MG tablet Take 5 mg by mouth daily as needed for erectile dysfunction.   Vitamin D , Ergocalciferol , (DRISDOL ) 1.25 MG (50000 UNIT) CAPS capsule Take 1 capsule (50,000 Units total) by mouth every 7 (seven) days.   ibuprofen  (ADVIL ) 800 MG tablet Take 1 tablet (800 mg total) by mouth every 8 (eight) hours as needed (pain). (Patient not taking: Reported on 02/05/2024)   tiZANidine  (ZANAFLEX ) 4 MG tablet Take 1 tablet (4 mg  total) by mouth every 8 (eight) hours as needed for muscle spasms. (Patient not taking: Reported on 02/05/2024)   No facility-administered encounter medications on file as of 02/24/2024.    Review of Systems  Review of Systems  Constitutional: Negative.   HENT: Negative.    Cardiovascular: Negative.   Gastrointestinal: Negative.   Allergic/Immunologic: Negative.   Neurological: Negative.   Psychiatric/Behavioral: Negative.       Objective:   BP 117/76   Pulse 89   Temp 98.1 F (36.7 C) (Oral)   Wt 192 lb (87.1 kg)   SpO2 98%   BMI 29.19 kg/m   Wt Readings from Last 5 Encounters:  02/24/24 192 lb (87.1 kg)  12/13/23 189 lb (85.7 kg)  11/24/23 187 lb (84.8 kg)  11/18/23 185 lb (83.9 kg)  11/17/23 185 lb (83.9 kg)     Physical Exam Vitals and nursing note reviewed.  Constitutional:      General: He is not in acute distress.    Appearance: He is well-developed.  Cardiovascular:     Rate and Rhythm: Normal rate and regular rhythm.  Pulmonary:     Effort: Pulmonary effort is normal.     Breath sounds: Normal breath sounds.  Skin:    General: Skin is warm and dry.  Neurological:     Mental Status: He is alert and oriented to person, place, and time.       Assessment & Plan:   Sickle  cell anemia without crisis (HCC) -     ToxAssure Flex 15, Ur -     Sickle Cell Panel  Screen for STD (sexually transmitted disease) -     Chlamydia/Gonococcus/Trichomonas, NAA -     RPR+HIV+GC+CT Panel     Return in about 3 months (around 05/26/2024).     Bascom GORMAN Borer, NP 02/24/2024

## 2024-02-24 NOTE — Patient Instructions (Signed)
 Living With Sickle Cell Disease Living with a long-term condition, such as sickle cell disease, can be a challenge. It can affect both your physical and mental health. You may not have total control over your condition. But proper care and treatment can help manage the effects of the disease so you can feel good and lead an active life. You can take steps to manage your condition and stay as healthy as possible. How does sickle cell disease affect me? Sickle cell disease can cause challenges that affect your quality of life. You may get sick more often as a result of organ damage and infections. Sometimes you may need to stay in the hospital. Learn how to recognize that you are not feeling well and that you may be getting sick. What actions can I take to manage my condition?  The goals of treatment are to control your symptoms and prevent and treat problems. Work with your health care provider to create a treatment plan that works for you. Taking an active role in managing your condition can help you feel more in control of your situation. Ask about possible side effects of medicines that your health care provider recommends. Discuss how you feel about having those side effects. Keeping a healthy lifestyle can help you manage your condition. This includes eating a healthy diet, getting enough sleep, and getting regular exercise. Sickle cell disease may affect your ability to take care of your basic needs. Tell your health care provider if you have concerns about any of these needs: Access to food. Housing. Safe drinking water and other utilities. Safety in your home and community. Work or school. Transportation. Paying for health care. Your health care provider may be able to connect you with community resources that can help you. How to manage stress  Living with sickle cell disease can be stressful. This disease can have a big impact on your mental health. Talk with your health care provider  about ways to reduce your stress or if you have concerns about your mental health.  To cope with stress, try: Keeping a stress diary. This can help you learn what causes your stress to start (figure out your triggers) and how to control your response to those triggers. Spending time doing things that you enjoy, such as: Hobbies. Being outdoors. Spending time with friends and people who make you laugh. Doing yoga, muscle relaxation, deep breathing, or mindfulness practices. Expressing yourself through journal writing, art, crafting, poetry, or playing music. Staying positive about your health. Try to accept that you cannot control your condition perfectly. Follow these instructions at home: Medicines Take over-the-counter and prescription medicines only as told by your health care provider. If you were prescribed antibiotics, take them as told by your health care provider. Do not stop taking them even if you start to feel better. If you develop a fever, do not take medicines to reduce the fever right away. This could cover up another problem. Contact your health care provider. Eating and drinking Drink enough fluid to keep your urine pale yellow. Drink more in hot weather and during exercise. Limit or avoid drinking alcohol. Eat a balanced and nutritious diet. Eat plenty of fruits, vegetables, whole grains, and lean protein. Take vitamins and supplements as told by your health care provider. Traveling When traveling, keep these with you: Your medical information. The names of your health care providers. Your medicines. If you have to travel by air, ask about precautions you should take. Managing pain Work with  your health care provider to create a pain management plan that works for you. The plan may include: Ways to reduce or manage your pain at home, such as: Using a heating pad. Taking a warm bath. Using healthy ways to distract you from the pain, such as hobbies or  reading. Practicing ways to relax, such as doing yoga or listening to music. Getting massages. Doing exercises or stretches as told by a physical therapist. Tracking how pain affects your daily life functions. When to seek help. Who to contact and what to do in case of a pain emergency. General instructions Do not use any products that contain nicotine or tobacco. These products include cigarettes, chewing tobacco, and vaping devices, such as e-cigarettes. These lower blood oxygen levels. If you need help quitting, ask your health care provider. Consider wearing a medical alert bracelet. Use an app or journal to track your symptoms, assess your level of pain and fatigue, and keep track of your medicines. Avoid the following: High altitudes. Very high or low temperatures and big changes in temperature. Activities that will lower your oxygen levels, such as mountain climbing or doing exercise that takes a lot of effort. Stay up to date on: Your treatment plan. Learn as much as you can about your condition. Health screenings. This will help prevent problems or catch them early on. Vaccines. This will help prevent infection. Wash your hands often with soap and water to help prevent infections. Wash them for at least 20 seconds each time. Keep all follow-up visits. Regular follow-up with your health care provider can help you better manage your condition. Where to find support You can find help and support through: Talking with a therapist or taking part in support groups. Sickle Cell Disease Foundation of [place]: www.sicklecelldisease.org Where to find more information Centers for Disease Control and Prevention: FootballExhibition.com.br American Society of Hematology: www.hematology.org Contact a health care provider if: Your symptoms get worse. You have new symptoms. You have a fever. Get help right away if: You have a painful erection of the penis that lasts a long time (priapism). You become  short of breath or are having trouble breathing. You have pain that cannot be controlled with medicine. You have any signs of a stroke. "BE FAST" is an easy way to remember the main warning signs: B - Balance. Dizziness, sudden trouble walking, or loss of balance. E - Eyes. Trouble seeing or a change in how you see. F - Face. Sudden weakness or loss of feeling of the face. The face or eyelid may droop on one side. A - Arms. Weakness or loss of feeling in an arm. This happens all of a sudden and most often on one side of the body. S - Speech. Sudden trouble speaking, slurred speech, or trouble understanding what people say. T - Time. Time to call emergency services. Write down what time symptoms started. You have other signs of a stroke, such as: A sudden, very bad headache with no known cause. Feeling like you may vomit (nausea). Vomiting. Seizure. These symptoms may be an emergency. Get help right away. Call 911. Do not wait to see if the symptoms will go away. Do not drive yourself to the hospital. Also, get help right away if: You have strong feelings of sadness or loss of hope, or you have thoughts about hurting yourself or others. Take one of these steps if you feel like you may hurt yourself or others, or have thoughts about taking your own life:  Go to your nearest emergency room. Call 911. Call the National Suicide Prevention Lifeline at (915)371-4213 or 988. This is open 24 hours a day. Text the Crisis Text Line at (909)230-5332. Summary Proper care and treatment can help manage the effects of sickle cell disease so you can feel good and lead an active life. The goals of treatment are to control your symptoms and prevent and treat problems. Taking an active role in managing your condition can help you feel more in control of your situation. Work with your health care provider to create a pain management plan that works for you. Get medical help right away as told by your health care  provider. This information is not intended to replace advice given to you by your health care provider. Make sure you discuss any questions you have with your health care provider. Document Revised: 10/19/2021 Document Reviewed: 10/19/2021 Elsevier Patient Education  2024 [employer].

## 2024-02-25 LAB — CMP14+CBC/D/PLT+FER+RETIC+V...
ALT: 31 IU/L (ref 0–44)
AST: 7 IU/L (ref 0–40)
Albumin: 5.1 g/dL (ref 4.1–5.1)
Alkaline Phosphatase: 72 IU/L (ref 44–121)
BUN/Creatinine Ratio: 11 (ref 9–20)
BUN: 11 mg/dL (ref 6–20)
Basophils Absolute: 0.1 x10E3/uL (ref 0.0–0.2)
Basos: 1 %
Bilirubin Total: 1.3 mg/dL — ABNORMAL HIGH (ref 0.0–1.2)
CO2: 24 mmol/L (ref 20–29)
Calcium: 9.9 mg/dL (ref 8.7–10.2)
Chloride: 99 mmol/L (ref 96–106)
Creatinine, Ser: 1.03 mg/dL (ref 0.76–1.27)
EOS (ABSOLUTE): 0.1 x10E3/uL (ref 0.0–0.4)
Eos: 1 %
Ferritin: 477 ng/mL — ABNORMAL HIGH (ref 30–400)
Globulin, Total: 3 g/dL (ref 1.5–4.5)
Glucose: 97 mg/dL (ref 70–99)
Hematocrit: 46.5 % (ref 37.5–51.0)
Hemoglobin: 15.4 g/dL (ref 13.0–17.7)
Immature Grans (Abs): 0.1 x10E3/uL (ref 0.0–0.1)
Immature Granulocytes: 1 %
Lymphocytes Absolute: 2 x10E3/uL (ref 0.7–3.1)
Lymphs: 21 %
MCH: 28.8 pg (ref 26.6–33.0)
MCHC: 33.1 g/dL (ref 31.5–35.7)
MCV: 87 fL (ref 79–97)
Monocytes Absolute: 1 x10E3/uL — ABNORMAL HIGH (ref 0.1–0.9)
Monocytes: 10 %
NRBC: 1 % — ABNORMAL HIGH (ref 0–0)
Neutrophils Absolute: 6.4 x10E3/uL (ref 1.4–7.0)
Neutrophils: 66 %
Platelets: 119 x10E3/uL — ABNORMAL LOW (ref 150–450)
Potassium: 4.8 mmol/L (ref 3.5–5.2)
RBC: 5.34 x10E6/uL (ref 4.14–5.80)
RDW: 15.6 % — ABNORMAL HIGH (ref 11.6–15.4)
Retic Ct Pct: 4.3 % — ABNORMAL HIGH (ref 0.6–2.6)
Sodium: 137 mmol/L (ref 134–144)
Total Protein: 8.1 g/dL (ref 6.0–8.5)
Vit D, 25-Hydroxy: 34.9 ng/mL (ref 30.0–100.0)
WBC: 9.6 x10E3/uL (ref 3.4–10.8)
eGFR: 98 mL/min/1.73 (ref >59)

## 2024-02-27 LAB — CHLAMYDIA/GONOCOCCUS/TRICHOMONAS, NAA
Chlamydia by NAA: NEGATIVE
Gonococcus by NAA: NEGATIVE
Trich vag by NAA: NEGATIVE

## 2024-02-27 LAB — RPR+HIV+GC+CT PANEL
HIV Screen 4th Generation wRfx: NONREACTIVE
RPR Ser Ql: NONREACTIVE

## 2024-02-28 LAB — TOXASSURE FLEX 15, UR

## 2024-02-28 LAB — CANNABINOIDS, MS, UR RFX
Cannabinoids Confirmation: POSITIVE
Carboxy-THC: >508 ng/mg{creat}

## 2024-02-28 LAB — OPIATE CLASS, MS, UR RFX
Codeine: NOT DETECTED ng/mg{creat}
Dihydrocodeine: NOT DETECTED ng/mg{creat}
Hydrocodone: NOT DETECTED ng/mg{creat}
Hydromorphone: NOT DETECTED ng/mg{creat}
Morphine: NOT DETECTED ng/mg{creat}
Norcodeine: NOT DETECTED ng/mg{creat}
Norhydrocodone: NOT DETECTED ng/mg{creat}
Normorphine: NOT DETECTED ng/mg{creat}
Opiate Class Confirmation: NEGATIVE

## 2024-02-28 LAB — OXYCODONE CLASS, MS, UR RFX
Noroxycodone: 952 ng/mg{creat}
Noroxymorphone: 64 ng/mg{creat}
Oxycodone Class Confirmation: POSITIVE
Oxycodone: 1527 ng/mg{creat}
Oxymorphone: 339 ng/mg{creat}

## 2024-02-29 ENCOUNTER — Ambulatory Visit: Payer: Self-pay | Admitting: Nurse Practitioner

## 2024-03-08 ENCOUNTER — Other Ambulatory Visit: Payer: Self-pay | Admitting: Nurse Practitioner

## 2024-03-08 ENCOUNTER — Other Ambulatory Visit (HOSPITAL_COMMUNITY): Payer: Self-pay

## 2024-03-08 DIAGNOSIS — D5700 Hb-SS disease with crisis, unspecified: Secondary | ICD-10-CM

## 2024-03-08 DIAGNOSIS — R110 Nausea: Secondary | ICD-10-CM

## 2024-03-08 MED ORDER — PROMETHAZINE HCL 25 MG PO TABS
25.0000 mg | ORAL_TABLET | Freq: Four times a day (QID) | ORAL | 0 refills | Status: DC | PRN
  Filled 2024-03-08: qty 20, 5d supply, fill #0

## 2024-03-08 MED ORDER — OXYCODONE HCL 10 MG PO TABS
10.0000 mg | ORAL_TABLET | Freq: Four times a day (QID) | ORAL | 0 refills | Status: DC | PRN
  Filled 2024-03-08: qty 60, 15d supply, fill #0

## 2024-03-21 ENCOUNTER — Other Ambulatory Visit: Payer: Self-pay | Admitting: Nurse Practitioner

## 2024-03-21 DIAGNOSIS — D5700 Hb-SS disease with crisis, unspecified: Secondary | ICD-10-CM

## 2024-03-21 DIAGNOSIS — R110 Nausea: Secondary | ICD-10-CM

## 2024-03-22 ENCOUNTER — Other Ambulatory Visit (HOSPITAL_COMMUNITY): Payer: Self-pay

## 2024-03-22 MED ORDER — PROMETHAZINE HCL 25 MG PO TABS
25.0000 mg | ORAL_TABLET | Freq: Four times a day (QID) | ORAL | 0 refills | Status: DC | PRN
  Filled 2024-03-22: qty 20, 5d supply, fill #0

## 2024-03-22 MED ORDER — OXYCODONE HCL 10 MG PO TABS
10.0000 mg | ORAL_TABLET | Freq: Four times a day (QID) | ORAL | 0 refills | Status: DC | PRN
  Filled 2024-03-23: qty 60, 15d supply, fill #0

## 2024-03-23 ENCOUNTER — Other Ambulatory Visit (HOSPITAL_COMMUNITY): Payer: Self-pay

## 2024-04-04 ENCOUNTER — Other Ambulatory Visit (HOSPITAL_COMMUNITY): Payer: Self-pay

## 2024-04-04 ENCOUNTER — Other Ambulatory Visit: Payer: Self-pay | Admitting: Nurse Practitioner

## 2024-04-04 DIAGNOSIS — D5700 Hb-SS disease with crisis, unspecified: Secondary | ICD-10-CM

## 2024-04-04 DIAGNOSIS — R110 Nausea: Secondary | ICD-10-CM

## 2024-04-04 MED ORDER — OXYCODONE HCL 10 MG PO TABS
10.0000 mg | ORAL_TABLET | Freq: Four times a day (QID) | ORAL | 0 refills | Status: DC | PRN
  Filled 2024-04-06: qty 60, 15d supply, fill #0
  Filled ????-??-??: fill #0

## 2024-04-04 MED ORDER — PROMETHAZINE HCL 25 MG PO TABS
25.0000 mg | ORAL_TABLET | Freq: Four times a day (QID) | ORAL | 0 refills | Status: DC | PRN
  Filled 2024-04-04: qty 20, 5d supply, fill #0

## 2024-04-04 NOTE — Telephone Encounter (Signed)
 Please advise [facility]

## 2024-04-06 ENCOUNTER — Other Ambulatory Visit (HOSPITAL_COMMUNITY): Payer: Self-pay

## 2024-04-08 ENCOUNTER — Emergency Department (HOSPITAL_COMMUNITY)
Admission: EM | Admit: 2024-04-08 | Discharge: 2024-04-08 | Disposition: A | Discharge status: Home / Self Care | Attending: Emergency Medicine | Admitting: Emergency Medicine

## 2024-04-08 ENCOUNTER — Other Ambulatory Visit: Payer: Self-pay

## 2024-04-08 DIAGNOSIS — D696 Thrombocytopenia, unspecified: Secondary | ICD-10-CM | POA: Insufficient documentation

## 2024-04-08 DIAGNOSIS — M79601 Pain in right arm: Secondary | ICD-10-CM | POA: Diagnosis present

## 2024-04-08 DIAGNOSIS — D5700 Hb-SS disease with crisis, unspecified: Secondary | ICD-10-CM | POA: Diagnosis not present

## 2024-04-08 LAB — CBC WITH DIFFERENTIAL/PLATELET
Abs Immature Granulocytes: 0.04 K/uL (ref 0.00–0.07)
Basophils Absolute: 0 K/uL (ref 0.0–0.1)
Basophils Relative: 0 %
Eosinophils Absolute: 0.1 K/uL (ref 0.0–0.5)
Eosinophils Relative: 1 %
HCT: 36.7 % — ABNORMAL LOW (ref 39.0–52.0)
Hemoglobin: 12.6 g/dL — ABNORMAL LOW (ref 13.0–17.0)
Immature Granulocytes: 0 %
Lymphocytes Relative: 7 %
Lymphs Abs: 0.7 K/uL (ref 0.7–4.0)
MCH: 27.2 pg (ref 26.0–34.0)
MCHC: 34.3 g/dL (ref 30.0–36.0)
MCV: 79.1 fL — ABNORMAL LOW (ref 80.0–100.0)
Monocytes Absolute: 1.4 K/uL — ABNORMAL HIGH (ref 0.1–1.0)
Monocytes Relative: 13 %
Neutro Abs: 8.3 K/uL — ABNORMAL HIGH (ref 1.7–7.7)
Neutrophils Relative %: 79 %
Platelets: 100 K/uL — ABNORMAL LOW (ref 150–400)
RBC: 4.64 MIL/uL (ref 4.22–5.81)
RDW: 14.3 % (ref 11.5–15.5)
WBC: 10.5 K/uL (ref 4.0–10.5)
nRBC: 0.4 % — ABNORMAL HIGH (ref 0.0–0.2)

## 2024-04-08 LAB — RETICULOCYTES
Immature Retic Fract: 31.9 % — ABNORMAL HIGH (ref 2.3–15.9)
RBC.: 4.6 MIL/uL (ref 4.22–5.81)
Retic Count, Absolute: 201.9 K/uL — ABNORMAL HIGH (ref 19.0–186.0)
Retic Ct Pct: 4.4 % — ABNORMAL HIGH (ref 0.4–3.1)

## 2024-04-08 LAB — COMPREHENSIVE METABOLIC PANEL WITH GFR
ALT: 27 U/L (ref 0–44)
AST: <10 U/L — ABNORMAL LOW (ref 15–41)
Albumin: 4.6 g/dL (ref 3.5–5.0)
Alkaline Phosphatase: 60 U/L (ref 38–126)
Anion gap: 12 (ref 5–15)
BUN: 10 mg/dL (ref 6–20)
CO2: 22 mmol/L (ref 22–32)
Calcium: 9.6 mg/dL (ref 8.9–10.3)
Chloride: 101 mmol/L (ref 98–111)
Creatinine, Ser: 0.85 mg/dL (ref 0.61–1.24)
GFR, Estimated: >60 mL/min (ref >60)
Glucose, Bld: 99 mg/dL (ref 70–99)
Potassium: 4.4 mmol/L (ref 3.5–5.1)
Sodium: 135 mmol/L (ref 135–145)
Total Bilirubin: 2.2 mg/dL — ABNORMAL HIGH (ref 0.0–1.2)
Total Protein: 7.6 g/dL (ref 6.5–8.1)

## 2024-04-08 MED ORDER — HYDROMORPHONE HCL 1 MG/ML IJ SOLN
2.0000 mg | Freq: Once | INTRAMUSCULAR | Status: AC
  Administered 2024-04-08: 2 mg via INTRAVENOUS
  Filled 2024-04-08: qty 2

## 2024-04-08 MED ORDER — ONDANSETRON HCL 4 MG/2ML IJ SOLN
4.0000 mg | INTRAMUSCULAR | Status: DC | PRN
  Administered 2024-04-08: 4 mg via INTRAVENOUS
  Filled 2024-04-08: qty 2

## 2024-04-08 NOTE — ED Triage Notes (Signed)
 Patient to ED for sickle cell crisis pain. Voices pain is in arms, legs and back. Home medications are not working.

## 2024-04-08 NOTE — ED Provider Notes (Signed)
 Austin [name] EMERGENCY DEPARTMENT AT [facility] Provider Note   CSN: [id number] Arrival date & time: 04/08/24  1111     Patient presents with: Sickle Cell Pain Crisis   Austin [name] is a 33 y.o. male with past medical history significant for sickle cell anemia, who presents with sickle cell crisis, arms, legs, back pain. Home pain meds not working. Denies chest pain, shortness of breath.    Sickle Cell Pain Crisis      Prior to Admission medications   Medication Sig Start Date End Date Taking? Authorizing Provider  ibuprofen  (ADVIL ) 800 MG tablet Take 1 tablet (800 mg total) by mouth every 8 (eight) hours as needed (pain). Patient not taking: Reported on 02/05/2024 12/13/23   Vonna Sharlet POUR, MD  Oxycodone  HCl 10 MG TABS Take 1 tablet (10 mg total) by mouth every 6 (six) hours as needed. 04/06/24   Oley Bascom RAMAN, NP  promethazine  (PHENERGAN ) 25 MG tablet Take 1 tablet (25 mg total) by mouth every 6 (six) hours as needed for nausea or vomiting. 04/04/24   Nichols, Tonya S, NP  tadalafil (CIALIS) 5 MG tablet Take 5 mg by mouth daily as needed for erectile dysfunction. 08/29/23   [provider]  tiZANidine  (ZANAFLEX ) 4 MG tablet Take 1 tablet (4 mg total) by mouth every 8 (eight) hours as needed for muscle spasms. Patient not taking: Reported on 02/05/2024 12/13/23   Vonna Sharlet POUR, MD  Vitamin D , Ergocalciferol , (DRISDOL ) 1.25 MG (50000 UNIT) CAPS capsule Take 1 capsule (50,000 Units total) by mouth every 7 (seven) days. 08/29/23   Nichols, Tonya S, NP    Allergies: Patient has no known allergies.    Review of Systems  All other systems reviewed and are negative.   Updated Vital Signs BP 117/72 (BP Location: Left Arm)   Pulse 88   Temp 99 F (37.2 C) (Oral)   Resp 18   Ht 5' 8 (1.727 m)   Wt 88 kg   SpO2 100%   BMI 29.50 kg/m   Physical Exam Vitals and nursing note reviewed.  Constitutional:      General: He is not in acute distress.     Appearance: Normal appearance.  HENT:     Head: Normocephalic and atraumatic.  Eyes:     General:        Right eye: No discharge.        Left eye: No discharge.  Cardiovascular:     Rate and Rhythm: Normal rate and regular rhythm.     Heart sounds: No murmur heard.    No friction rub. No gallop.  Pulmonary:     Effort: Pulmonary effort is normal.     Breath sounds: Normal breath sounds.  Abdominal:     General: Bowel sounds are normal.     Palpations: Abdomen is soft.  Musculoskeletal:     Comments: Diffuse tenderness palpation throughout the low back, moves all 4 limbs spontaneously, intact strength 5/5 of bilateral upper and lower extremities.  Skin:    General: Skin is warm and dry.     Capillary Refill: Capillary refill takes less than 2 seconds.  Neurological:     Mental Status: He is alert and oriented to person, place, and time.     Comments: Normal sensation throughout  Psychiatric:        Mood and Affect: Mood normal.        Behavior: Behavior normal.     (all labs ordered are  listed, but only abnormal results are displayed) Labs Reviewed  COMPREHENSIVE METABOLIC PANEL WITH GFR - Abnormal; Notable for the following components:      Result Value   AST <10 (*)    Total Bilirubin 2.2 (*)    All other components within normal limits  CBC WITH DIFFERENTIAL/PLATELET - Abnormal; Notable for the following components:   Hemoglobin 12.6 (*)    HCT 36.7 (*)    MCV 79.1 (*)    Platelets 100 (*)    nRBC 0.4 (*)    Neutro Abs 8.3 (*)    Monocytes Absolute 1.4 (*)    All other components within normal limits  RETICULOCYTES - Abnormal; Notable for the following components:   Retic Ct Pct 4.4 (*)    Retic Count, Absolute 201.9 (*)    Immature Retic Fract 31.9 (*)    All other components within normal limits    EKG: None  Radiology: No results found.   Procedures   Medications Ordered in the ED  ondansetron  (ZOFRAN ) injection 4 mg (4 mg Intravenous Given 04/08/24  1310)  HYDROmorphone  (DILAUDID ) injection 2 mg (2 mg Intravenous Given 04/08/24 1311)  HYDROmorphone  (DILAUDID ) injection 2 mg (2 mg Intravenous Given 04/08/24 1401)                                    Medical Decision Making Amount and/or Complexity of Data Reviewed Labs: ordered.  Risk Prescription drug management.   This patient is a 33 y.o. male  who presents to the ED for concern of sickle cell pain.   Differential diagnoses prior to evaluation: The emergent differential diagnosis includes, but is not limited to, acute sickle cell crisis, aplastic crisis, chronic pain, versus other. This is not an exhaustive differential.   Past Medical History / Co-morbidities / Social History: Sickle cell anemia  Additional history: Chart reviewed. Pertinent results include: reviewed labwork, imaging from recent previous ED visits  Physical Exam: Physical exam performed. The pertinent findings include:  Normal sensation throughout  Diffuse tenderness palpation throughout the low back, moves all 4 limbs spontaneously, intact strength 5/5 of bilateral upper and lower extremities.   Neurovascularly intact and ambulatory without difficulty Vital signs stable in the emergency department  Lab Tests/Imaging studies: I personally interpreted labs/imaging and the pertinent results include: CBC with mild anemia, hemoglobin 12.6, mild thrombocytopenia, platelets 100, not significantly changed from baseline.  CMP with elevated total bilirubin 2.2, suspect secondary to blood product breakdown, elevated reticulocytes, no evidence of acute aplastic crisis.   Medications: I ordered medication including Dilaudid  for pain, Zofran  for nausea.  I have reviewed the patients home medicines and have made adjustments as needed.   Disposition: After consideration of the diagnostic results and the patients response to treatment, I feel that on reassessment patient feeling improved, feels stable for return to home  medication, close sickle cell pain clinic follow-up.   emergency department workup does not suggest an emergent condition requiring admission or immediate intervention beyond what has been performed at this time. The plan is: as above, pain improved, stable for discharge at this time. The patient is safe for discharge and has been instructed to return immediately for worsening symptoms, change in symptoms or any other concerns.   Final diagnoses:  Sickle cell pain crisis [facility])    ED Discharge Orders     None          Nicko Daher,  Sherlean VEAR RIGGERS 04/08/24 1455    Francesca Elsie CROME, MD 04/09/24 [phone]

## 2024-04-08 NOTE — Discharge Instructions (Signed)
 Continue your home pain medication, follow-up with the sickle cell clinic, return to the emergency department you have significant worsening symptoms despite treatment.

## 2024-04-09 ENCOUNTER — Telehealth: Payer: Self-pay

## 2024-04-09 NOTE — Transitions of Care (Post Inpatient/ED Visit) (Signed)
   04/09/2024  Name: Austin [name] MRN: [id number] DOB: [birth year]  Today's TOC FU Call Status: Today's TOC FU Call Status:: Unsuccessful Call (1st Attempt) Unsuccessful Call (1st Attempt) Date: 04/09/24  Attempted to reach the patient regarding the most recent Inpatient/ED visit.  Follow Up Plan: Additional outreach attempts will be made to reach the patient to complete the Transitions of Care (Post Inpatient/ED visit) call.   Signature  Stefano ORN, CMA  [facility] AWV Team Direct Dial: [phone]

## 2024-04-20 ENCOUNTER — Other Ambulatory Visit: Payer: Self-pay | Admitting: Nurse Practitioner

## 2024-04-20 ENCOUNTER — Other Ambulatory Visit (HOSPITAL_COMMUNITY): Payer: Self-pay

## 2024-04-20 DIAGNOSIS — D5700 Hb-SS disease with crisis, unspecified: Secondary | ICD-10-CM

## 2024-04-20 DIAGNOSIS — R110 Nausea: Secondary | ICD-10-CM

## 2024-04-20 MED ORDER — PROMETHAZINE HCL 25 MG PO TABS
25.0000 mg | ORAL_TABLET | Freq: Four times a day (QID) | ORAL | 0 refills | Status: DC | PRN
  Filled 2024-04-20 – 2024-04-21 (×2): qty 20, 5d supply, fill #0

## 2024-04-20 MED ORDER — OXYCODONE HCL 10 MG PO TABS
10.0000 mg | ORAL_TABLET | Freq: Four times a day (QID) | ORAL | 0 refills | Status: DC | PRN
  Filled 2024-04-20 – 2024-04-21 (×2): qty 60, 15d supply, fill #0

## 2024-04-21 ENCOUNTER — Other Ambulatory Visit (HOSPITAL_COMMUNITY): Payer: Self-pay

## 2024-04-23 ENCOUNTER — Other Ambulatory Visit: Payer: Self-pay

## 2024-05-03 ENCOUNTER — Other Ambulatory Visit: Payer: Self-pay | Admitting: Nurse Practitioner

## 2024-05-03 ENCOUNTER — Other Ambulatory Visit (HOSPITAL_COMMUNITY): Payer: Self-pay

## 2024-05-03 DIAGNOSIS — R110 Nausea: Secondary | ICD-10-CM

## 2024-05-03 DIAGNOSIS — D5700 Hb-SS disease with crisis, unspecified: Secondary | ICD-10-CM

## 2024-05-03 MED ORDER — PROMETHAZINE HCL 25 MG PO TABS
25.0000 mg | ORAL_TABLET | Freq: Four times a day (QID) | ORAL | 0 refills | Status: DC | PRN
  Filled 2024-05-03: qty 20, 5d supply, fill #0

## 2024-05-03 MED ORDER — OXYCODONE HCL 10 MG PO TABS
10.0000 mg | ORAL_TABLET | Freq: Four times a day (QID) | ORAL | 0 refills | Status: DC | PRN
  Filled 2024-05-05: qty 60, 15d supply, fill #0

## 2024-05-03 NOTE — Telephone Encounter (Signed)
 Please advise [facility]

## 2024-05-04 ENCOUNTER — Other Ambulatory Visit: Payer: Self-pay

## 2024-05-05 ENCOUNTER — Other Ambulatory Visit (HOSPITAL_COMMUNITY): Payer: Self-pay

## 2024-05-05 ENCOUNTER — Emergency Department (HOSPITAL_COMMUNITY)

## 2024-05-05 ENCOUNTER — Other Ambulatory Visit: Payer: Self-pay

## 2024-05-05 ENCOUNTER — Encounter (HOSPITAL_COMMUNITY): Payer: Self-pay | Admitting: Emergency Medicine

## 2024-05-05 ENCOUNTER — Emergency Department (HOSPITAL_COMMUNITY)
Admission: EM | Admit: 2024-05-05 | Discharge: 2024-05-05 | Disposition: A | Discharge status: Home / Self Care | Attending: Emergency Medicine | Admitting: Emergency Medicine

## 2024-05-05 DIAGNOSIS — D5700 Hb-SS disease with crisis, unspecified: Secondary | ICD-10-CM | POA: Diagnosis not present

## 2024-05-05 DIAGNOSIS — R109 Unspecified abdominal pain: Secondary | ICD-10-CM | POA: Diagnosis not present

## 2024-05-05 DIAGNOSIS — M545 Low back pain, unspecified: Secondary | ICD-10-CM | POA: Diagnosis present

## 2024-05-05 LAB — URINALYSIS, ROUTINE W REFLEX MICROSCOPIC
Bilirubin Urine: NEGATIVE
Glucose, UA: NEGATIVE mg/dL
Hgb urine dipstick: NEGATIVE
Ketones, ur: NEGATIVE mg/dL
Leukocytes,Ua: NEGATIVE
Nitrite: NEGATIVE
Protein, ur: NEGATIVE mg/dL
Specific Gravity, Urine: 1.015 (ref 1.005–1.030)
pH: 7 (ref 5.0–8.0)

## 2024-05-05 LAB — CBC WITH DIFFERENTIAL/PLATELET
Abs Immature Granulocytes: 0.1 K/uL — ABNORMAL HIGH (ref 0.00–0.07)
Basophils Absolute: 0 K/uL (ref 0.0–0.1)
Basophils Relative: 0 %
Eosinophils Absolute: 0 K/uL (ref 0.0–0.5)
Eosinophils Relative: 0 %
HCT: 38.6 % — ABNORMAL LOW (ref 39.0–52.0)
Hemoglobin: 13.7 g/dL (ref 13.0–17.0)
Immature Granulocytes: 1 %
Lymphocytes Relative: 6 %
Lymphs Abs: 1.3 K/uL (ref 0.7–4.0)
MCH: 28 pg (ref 26.0–34.0)
MCHC: 35.5 g/dL (ref 30.0–36.0)
MCV: 78.9 fL — ABNORMAL LOW (ref 80.0–100.0)
Monocytes Absolute: 1.7 K/uL — ABNORMAL HIGH (ref 0.1–1.0)
Monocytes Relative: 8 %
Neutro Abs: 18.4 K/uL — ABNORMAL HIGH (ref 1.7–7.7)
Neutrophils Relative %: 85 %
Platelets: 117 K/uL — ABNORMAL LOW (ref 150–400)
RBC: 4.89 MIL/uL (ref 4.22–5.81)
RDW: 14.7 % (ref 11.5–15.5)
WBC: 21.5 K/uL — ABNORMAL HIGH (ref 4.0–10.5)
nRBC: 0.2 % (ref 0.0–0.2)

## 2024-05-05 LAB — COMPREHENSIVE METABOLIC PANEL WITH GFR
ALT: 39 U/L (ref 0–44)
AST: 15 U/L (ref 15–41)
Albumin: 5 g/dL (ref 3.5–5.0)
Alkaline Phosphatase: 67 U/L (ref 38–126)
Anion gap: 13 (ref 5–15)
BUN: 8 mg/dL (ref 6–20)
CO2: 24 mmol/L (ref 22–32)
Calcium: 10.4 mg/dL — ABNORMAL HIGH (ref 8.9–10.3)
Chloride: 99 mmol/L (ref 98–111)
Creatinine, Ser: 0.95 mg/dL (ref 0.61–1.24)
GFR, Estimated: >60 mL/min (ref >60)
Glucose, Bld: 96 mg/dL (ref 70–99)
Potassium: 4.1 mmol/L (ref 3.5–5.1)
Sodium: 136 mmol/L (ref 135–145)
Total Bilirubin: 2.8 mg/dL — ABNORMAL HIGH (ref 0.0–1.2)
Total Protein: 8.8 g/dL — ABNORMAL HIGH (ref 6.5–8.1)

## 2024-05-05 LAB — TROPONIN T, HIGH SENSITIVITY: Troponin T High Sensitivity: <15 ng/L (ref 0–19)

## 2024-05-05 MED ORDER — KETOROLAC TROMETHAMINE 30 MG/ML IJ SOLN
30.0000 mg | Freq: Once | INTRAMUSCULAR | Status: AC
  Administered 2024-05-05: 30 mg via INTRAVENOUS
  Filled 2024-05-05: qty 1

## 2024-05-05 MED ORDER — HYDROMORPHONE HCL 1 MG/ML IJ SOLN
2.0000 mg | Freq: Once | INTRAMUSCULAR | Status: AC
  Administered 2024-05-05: 2 mg via INTRAVENOUS
  Filled 2024-05-05: qty 2

## 2024-05-05 MED ORDER — SODIUM CHLORIDE 0.9 % IV SOLN
25.0000 mg | Freq: Once | INTRAVENOUS | Status: AC
  Administered 2024-05-05: 25 mg via INTRAVENOUS
  Filled 2024-05-05: qty 25

## 2024-05-05 MED ORDER — SODIUM CHLORIDE 0.9 % IV BOLUS
1000.0000 mL | Freq: Once | INTRAVENOUS | Status: AC
  Administered 2024-05-05: 1000 mL via INTRAVENOUS

## 2024-05-05 NOTE — Discharge Instructions (Signed)
 Follow-up with your doctor as needed.

## 2024-05-05 NOTE — ED Triage Notes (Signed)
 Pt reports chest pain and back pain that started this morning. PT reports this is different from home sickle cell pain.

## 2024-05-05 NOTE — ED Provider Notes (Signed)
 [place] EMERGENCY DEPARTMENT AT [facility] Provider Note   CSN: [id number] Arrival date & time: 05/05/24  1529     Patient presents with: Sickle Cell Pain Crisis   Austin [name] is a 33 y.o. male.  {Add pertinent medical, surgical, social history, OB history to YEP:67052} Patient complains of low back pain and leg pain.  He thinks it is his sickle cell causing a crisis and the pain   Sickle Cell Pain Crisis      Prior to Admission medications   Medication Sig Start Date End Date Taking? Authorizing Provider  ibuprofen  (ADVIL ) 800 MG tablet Take 1 tablet (800 mg total) by mouth every 8 (eight) hours as needed (pain). Patient not taking: Reported on 02/05/2024 12/13/23   Vonna Sharlet POUR, MD  Oxycodone  HCl 10 MG TABS Take 1 tablet (10 mg total) by mouth every 6 (six) hours as needed. 05/05/24   Oley Bascom RAMAN, NP  promethazine  (PHENERGAN ) 25 MG tablet Take 1 tablet (25 mg total) by mouth every 6 (six) hours as needed for nausea or vomiting. 05/03/24   Nichols, Tonya S, NP  tadalafil (CIALIS) 5 MG tablet Take 5 mg by mouth daily as needed for erectile dysfunction. 08/29/23   [provider]  tiZANidine  (ZANAFLEX ) 4 MG tablet Take 1 tablet (4 mg total) by mouth every 8 (eight) hours as needed for muscle spasms. Patient not taking: Reported on 02/05/2024 12/13/23   Vonna Sharlet POUR, MD  Vitamin D , Ergocalciferol , (DRISDOL ) 1.25 MG (50000 UNIT) CAPS capsule Take 1 capsule (50,000 Units total) by mouth every 7 (seven) days. 08/29/23   Nichols, Tonya S, NP    Allergies: Patient has no known allergies.    Review of Systems  Updated Vital Signs BP 109/62   Pulse 96   Temp 98.5 F (36.9 C)   Resp 20   SpO2 99%   Physical Exam  (all labs ordered are listed, but only abnormal results are displayed) Labs Reviewed  CBC WITH DIFFERENTIAL/PLATELET - Abnormal; Notable for the following components:      Result Value   WBC 21.5 (*)    HCT 38.6 (*)    MCV  78.9 (*)    Platelets 117 (*)    Neutro Abs 18.4 (*)    Monocytes Absolute 1.7 (*)    Abs Immature Granulocytes 0.10 (*)    All other components within normal limits  COMPREHENSIVE METABOLIC PANEL WITH GFR - Abnormal; Notable for the following components:   Calcium 10.4 (*)    Total Protein 8.8 (*)    Total Bilirubin 2.8 (*)    All other components within normal limits  URINALYSIS, ROUTINE W REFLEX MICROSCOPIC  TROPONIN T, HIGH SENSITIVITY    EKG: None  Radiology: CT Renal Stone Study Result Date: 05/05/2024 CLINICAL DATA:  Flank pain EXAM: CT ABDOMEN AND PELVIS WITHOUT CONTRAST TECHNIQUE: Multidetector CT imaging of the abdomen and pelvis was performed following the standard protocol without IV contrast. RADIATION DOSE REDUCTION: This exam was performed according to the departmental dose-optimization program which includes automated exposure control, adjustment of the mA and/or kV according to patient size and/or use of iterative reconstruction technique. COMPARISON:  10/23/2022 FINDINGS: Lower chest: No acute abnormality. Hepatobiliary: No focal liver abnormality is seen. No gallstones, gallbladder wall thickening, or biliary dilatation. Pancreas: Unremarkable. No pancreatic ductal dilatation or surrounding inflammatory changes. Spleen: Spleen is enlarged in size stable from the prior exam. Adrenals/Urinary Tract: Adrenal glands are within normal limits. Kidneys are well  visualized bilaterally. No renal calculi or obstructive changes are seen. The bladder is partially distended. Stomach/Bowel: No obstructive or inflammatory changes of the colon are noted. The appendix is within normal limits. Small bowel and stomach are unremarkable. Vascular/Lymphatic: No significant vascular findings are present. No enlarged abdominal or pelvic lymph nodes. Reproductive: Prostate is unremarkable. Other: No abdominal wall hernia or abnormality. No abdominopelvic ascites. Musculoskeletal: No acute or  significant osseous findings. IMPRESSION: No renal calculi or obstructive changes. Stable splenomegaly. Electronically Signed   By: Oneil Devonshire M.D.   On: 05/05/2024 20:06   DG Chest Port 1 View Result Date: 05/05/2024 CLINICAL DATA:  Chest pain. EXAM: PORTABLE CHEST 1 VIEW COMPARISON:  February 05, 2024 FINDINGS: The heart size and mediastinal contours are within normal limits. Both lungs are clear. The visualized skeletal structures are unremarkable. IMPRESSION: No active disease. Electronically Signed   By: Suzen Dials M.D.   On: 05/05/2024 17:18    {Document cardiac monitor, telemetry assessment procedure when appropriate:32947} Procedures   Medications Ordered in the ED  sodium chloride  0.9 % bolus 1,000 mL (0 mLs Intravenous Stopped 05/05/24 1955)  HYDROmorphone  (DILAUDID ) injection 2 mg (2 mg Intravenous Given 05/05/24 1717)  ketorolac  (TORADOL ) 30 MG/ML injection 30 mg (30 mg Intravenous Given 05/05/24 1719)  promethazine  (PHENERGAN ) 25 mg in sodium chloride  0.9 % 50 mL IVPB (0 mg Intravenous Stopped 05/05/24 1955)  HYDROmorphone  (DILAUDID ) injection 2 mg (2 mg Intravenous Given 05/05/24 1823)      {Click here for ABCD2, HEART and other calculators REFRESH Note before signing:1}                              Medical Decision Making Amount and/or Complexity of Data Reviewed Labs: ordered. Radiology: ordered. ECG/medicine tests: ordered.  Risk Prescription drug management.   Sickle cell crisis improved with treatment in the emergency department he will follow-up with PCP  {Document critical care time when appropriate  Document review of labs and clinical decision tools ie CHADS2VASC2, etc  Document your independent review of radiology images and any outside records  Document your discussion with family members, caretakers and with consultants  Document social determinants of health affecting pt's care  Document your decision making why or why not admission, treatments  were needed:32947:::1}   Final diagnoses:  Sickle cell pain crisis [facility])    ED Discharge Orders     None

## 2024-05-05 NOTE — ED Notes (Signed)
 RN introduced self to patient.  Pt states he is feeling better and asked about discharge.  MD notified.

## 2024-05-19 ENCOUNTER — Other Ambulatory Visit: Payer: Self-pay | Admitting: Nurse Practitioner

## 2024-05-19 DIAGNOSIS — R110 Nausea: Secondary | ICD-10-CM

## 2024-05-19 DIAGNOSIS — D5700 Hb-SS disease with crisis, unspecified: Secondary | ICD-10-CM

## 2024-05-21 MED ORDER — OXYCODONE HCL 10 MG PO TABS
10.0000 mg | ORAL_TABLET | Freq: Four times a day (QID) | ORAL | 0 refills | Status: DC | PRN
  Filled 2024-05-21: qty 60, 15d supply, fill #0

## 2024-05-21 MED ORDER — PROMETHAZINE HCL 25 MG PO TABS
25.0000 mg | ORAL_TABLET | Freq: Four times a day (QID) | ORAL | 0 refills | Status: DC | PRN
  Filled 2024-05-21: qty 20, 5d supply, fill #0

## 2024-05-21 NOTE — Telephone Encounter (Signed)
 Please advise [facility]

## 2024-05-22 ENCOUNTER — Other Ambulatory Visit (HOSPITAL_COMMUNITY): Payer: Self-pay

## 2024-05-28 ENCOUNTER — Ambulatory Visit: Payer: Self-pay | Admitting: Nurse Practitioner

## 2024-06-04 ENCOUNTER — Encounter: Payer: Self-pay | Admitting: Nurse Practitioner

## 2024-06-04 ENCOUNTER — Other Ambulatory Visit: Payer: Self-pay | Admitting: Nurse Practitioner

## 2024-06-04 ENCOUNTER — Other Ambulatory Visit (HOSPITAL_COMMUNITY): Payer: Self-pay

## 2024-06-04 ENCOUNTER — Ambulatory Visit (INDEPENDENT_AMBULATORY_CARE_PROVIDER_SITE_OTHER): Payer: Self-pay | Admitting: Nurse Practitioner

## 2024-06-04 VITALS — BP 123/75 | HR 75 | Wt 199.0 lb

## 2024-06-04 DIAGNOSIS — D5700 Hb-SS disease with crisis, unspecified: Secondary | ICD-10-CM

## 2024-06-04 DIAGNOSIS — D571 Sickle-cell disease without crisis: Secondary | ICD-10-CM | POA: Diagnosis not present

## 2024-06-04 DIAGNOSIS — R110 Nausea: Secondary | ICD-10-CM

## 2024-06-04 MED ORDER — TIZANIDINE HCL 4 MG PO TABS
4.0000 mg | ORAL_TABLET | Freq: Three times a day (TID) | ORAL | 0 refills | Status: DC | PRN
  Filled 2024-06-04: qty 15, 5d supply, fill #0

## 2024-06-04 MED ORDER — OXYCODONE HCL 10 MG PO TABS
10.0000 mg | ORAL_TABLET | Freq: Four times a day (QID) | ORAL | 0 refills | Status: DC | PRN
  Filled 2024-06-05: qty 60, 15d supply, fill #0

## 2024-06-04 NOTE — Progress Notes (Signed)
 Subjective   Patient ID: Austin [name], male    DOB: 04-18-1991, 33 y.o.   MRN: [id number]  Chief Complaint  Patient presents with   Sickle Cell Anemia    Referring provider: Oley Bascom RAMAN, NP  Austin [name] is a 33 y.o. male with Past Medical History: No date: Acute maxillary sinusitis No date: Eczema .: Sickle cell anemia (HCC)   HPI    Patient presents today for a follow-up visit. This is a patient of Dr. Jegede. He has had several ED visits for sickle cell crisis over the past few months. Patient usually takes oxycodone  HCL 10 mg for sickle cell pain. Denies f/c/s, n/v/d, hemoptysis, PND, leg swelling. Denies chest pain or edema.      No Known Allergies  Immunization History  Administered Date(s) Administered   Influenza,inj,Quad PF,6+ Mos 03/13/2018   Influenza-Unspecified 04/25/2020   PFIZER(Purple Top)SARS-COV-2 Vaccination 04/04/2020, 04/25/2020   Tdap 08/27/2015, 04/11/2023    Tobacco History: Social History   Tobacco Use  Smoking Status Former   Current packs/day: 0.00   Types: Cigarettes   Quit date: 11/23/2013   Years since quitting: 10.5  Smokeless Tobacco Never   Counseling given: Not Answered   Outpatient Encounter Medications as of 06/04/2024  Medication Sig   promethazine  (PHENERGAN ) 25 MG tablet Take 1 tablet (25 mg total) by mouth every 6 (six) hours as needed for nausea or vomiting.   tadalafil (CIALIS) 5 MG tablet Take 5 mg by mouth daily as needed for erectile dysfunction.   Vitamin D , Ergocalciferol , (DRISDOL ) 1.25 MG (50000 UNIT) CAPS capsule Take 1 capsule (50,000 Units total) by mouth every 7 (seven) days.   [DISCONTINUED] Oxycodone  HCl 10 MG TABS Take 1 tablet (10 mg total) by mouth every 6 (six) hours as needed.   ibuprofen  (ADVIL ) 800 MG tablet Take 1 tablet (800 mg total) by mouth every 8 (eight) hours as needed (pain). (Patient not taking: Reported on 06/04/2024)   [START ON 06/05/2024] Oxycodone  HCl 10 MG TABS Take  1 tablet (10 mg total) by mouth every 6 (six) hours as needed.   tiZANidine  (ZANAFLEX ) 4 MG tablet Take 1 tablet (4 mg total) by mouth every 8 (eight) hours as needed for muscle spasms. (Patient not taking: Reported on 06/04/2024)   No facility-administered encounter medications on file as of 06/04/2024.    Review of Systems  Review of Systems  Constitutional: Negative.   HENT: Negative.    Cardiovascular: Negative.   Gastrointestinal: Negative.   Allergic/Immunologic: Negative.   Neurological: Negative.   Psychiatric/Behavioral: Negative.       Objective:   BP 123/75 (BP Location: Left Arm, Patient Position: Sitting, Cuff Size: Large)   Pulse 75   Wt 199 lb (90.3 kg)   SpO2 98%   BMI 30.26 kg/m   Wt Readings from Last 5 Encounters:  06/04/24 199 lb (90.3 kg)  04/08/24 194 lb 0.1 oz (88 kg)  02/24/24 192 lb (87.1 kg)  12/13/23 189 lb (85.7 kg)  11/24/23 187 lb (84.8 kg)     Physical Exam Vitals and nursing note reviewed.  Constitutional:      General: He is not in acute distress.    Appearance: He is well-developed.  Cardiovascular:     Rate and Rhythm: Normal rate and regular rhythm.  Pulmonary:     Effort: Pulmonary effort is normal.     Breath sounds: Normal breath sounds.  Skin:    General: Skin is warm and dry.  Neurological:  Mental Status: He is alert and oriented to person, place, and time.       Assessment & Plan:   Sickle cell anemia without crisis (HCC) -     Sickle Cell Panel -     ToxAssure Flex 15, Ur  Sickle cell anemia with pain (HCC) -     oxyCODONE  HCl; Take 1 tablet (10 mg total) by mouth every 6 (six) hours as needed.  Dispense: 60 tablet; Refill: 0     Return in about 3 months (around 09/04/2024).    Bascom GORMAN Borer, NP 06/04/2024

## 2024-06-04 NOTE — Addendum Note (Signed)
 Addended by: OLEY BASCOM RAMAN on: 06/04/2024 03:50 PM   Modules accepted: Orders

## 2024-06-05 ENCOUNTER — Other Ambulatory Visit: Payer: Self-pay

## 2024-06-05 ENCOUNTER — Other Ambulatory Visit (HOSPITAL_COMMUNITY): Payer: Self-pay

## 2024-06-05 LAB — CMP14+CBC/D/PLT+FER+RETIC+V...
ALT: 20 IU/L (ref 0–44)
AST: 10 IU/L (ref 0–40)
Albumin: 4.5 g/dL (ref 4.1–5.1)
Alkaline Phosphatase: 65 IU/L (ref 47–123)
BUN/Creatinine Ratio: 7 — ABNORMAL LOW (ref 9–20)
BUN: 8 mg/dL (ref 6–20)
Basophils Absolute: 0 x10E3/uL (ref 0.0–0.2)
Basos: 0 %
Bilirubin Total: 1.3 mg/dL — ABNORMAL HIGH (ref 0.0–1.2)
CO2: 25 mmol/L (ref 20–29)
Calcium: 9.6 mg/dL (ref 8.7–10.2)
Chloride: 102 mmol/L (ref 96–106)
Creatinine, Ser: 1.1 mg/dL (ref 0.76–1.27)
EOS (ABSOLUTE): 0.1 x10E3/uL (ref 0.0–0.4)
Eos: 1 %
Ferritin: 397 ng/mL (ref 30–400)
Globulin, Total: 3.1 g/dL (ref 1.5–4.5)
Glucose: 109 mg/dL — ABNORMAL HIGH (ref 70–99)
Hematocrit: 41.6 % (ref 37.5–51.0)
Hemoglobin: 13.6 g/dL (ref 13.0–17.7)
Immature Grans (Abs): 0 x10E3/uL (ref 0.0–0.1)
Immature Granulocytes: 0 %
Lymphocytes Absolute: 3.5 x10E3/uL — ABNORMAL HIGH (ref 0.7–3.1)
Lymphs: 36 %
MCH: 28 pg (ref 26.6–33.0)
MCHC: 32.7 g/dL (ref 31.5–35.7)
MCV: 86 fL (ref 79–97)
Monocytes Absolute: 0.7 x10E3/uL (ref 0.1–0.9)
Monocytes: 7 %
Neutrophils Absolute: 5.5 x10E3/uL (ref 1.4–7.0)
Neutrophils: 56 %
Platelets: 113 x10E3/uL — ABNORMAL LOW (ref 150–450)
Potassium: 4.2 mmol/L (ref 3.5–5.2)
RBC: 4.85 x10E6/uL (ref 4.14–5.80)
RDW: 16.1 % — ABNORMAL HIGH (ref 11.6–15.4)
Retic Ct Pct: 5 % — ABNORMAL HIGH (ref 0.6–2.6)
Sodium: 139 mmol/L (ref 134–144)
Total Protein: 7.6 g/dL (ref 6.0–8.5)
Vit D, 25-Hydroxy: 44.9 ng/mL (ref 30.0–100.0)
WBC: 9.9 x10E3/uL (ref 3.4–10.8)
eGFR: 90 mL/min/1.73 (ref >59)

## 2024-06-05 NOTE — Telephone Encounter (Signed)
 Please advise [facility]

## 2024-06-06 ENCOUNTER — Other Ambulatory Visit (HOSPITAL_COMMUNITY): Payer: Self-pay

## 2024-06-06 ENCOUNTER — Ambulatory Visit: Payer: Self-pay | Admitting: Nurse Practitioner

## 2024-06-06 MED ORDER — PROMETHAZINE HCL 25 MG PO TABS
25.0000 mg | ORAL_TABLET | Freq: Four times a day (QID) | ORAL | 0 refills | Status: DC | PRN
  Filled 2024-06-06 – 2024-06-18 (×2): qty 20, 5d supply, fill #0

## 2024-06-08 LAB — TOXASSURE FLEX 15, UR
6-ACETYLMORPHINE IA: NEGATIVE ng/mL
7-aminoclonazepam: NOT DETECTED ng/mg{creat}
Alpha-hydroxyalprazolam: NOT DETECTED ng/mg{creat}
Alpha-hydroxymidazolam: NOT DETECTED ng/mg{creat}
Alpha-hydroxytriazolam: NOT DETECTED ng/mg{creat}
Alprazolam: NOT DETECTED ng/mg{creat}
Amino Chloropyridine: NOT DETECTED
BARBITURATES IA: NEGATIVE ng/mL
Buprenorphine: NOT DETECTED ng/mg{creat}
COCAINE METABOLITE IA: NEGATIVE ng/mL
Clonazepam: NOT DETECTED ng/mg{creat}
Creatinine: 64 mg/dL (ref >=20)
Desalkylflurazepam: NOT DETECTED ng/mg{creat}
Desmethyldiazepam: NOT DETECTED ng/mg{creat}
Desmethylflunitrazepam: NOT DETECTED ng/mg{creat}
Diazepam: NOT DETECTED ng/mg{creat}
ETHANOL BIOMARKERS IA: NEGATIVE ng/mL
Fentanyl: NOT DETECTED ng/mg{creat}
Flunitrazepam: NOT DETECTED ng/mg{creat}
Lorazepam: NOT DETECTED ng/mg{creat}
Methylphenidate: NOT DETECTED
Midazolam: NOT DETECTED ng/mg{creat}
Norbuprenorphine: NOT DETECTED ng/mg{creat}
Norfentanyl: NOT DETECTED ng/mg{creat}
OPIATE CLASS IA: NEGATIVE ng/mL
Oxazepam: NOT DETECTED ng/mg{creat}
Pregabalin: NOT DETECTED
Ritalinic Acid: NOT DETECTED
TRAMADOL IA: NEGATIVE ng/mL
Temazepam: NOT DETECTED ng/mg{creat}
Zaleplon: NOT DETECTED
Zolpidem Acid: NOT DETECTED
Zolpidem: NOT DETECTED
Zopiclone/Eszopiclone: NOT DETECTED

## 2024-06-08 LAB — OXYCODONE CLASS, MS, UR RFX
Noroxycodone: 869 ng/mg{creat}
Noroxymorphone: NOT DETECTED ng/mg{creat}
Oxycodone Class Confirmation: POSITIVE — AB
Oxycodone: 528 ng/mg{creat}
Oxymorphone: 316 ng/mg{creat}

## 2024-06-08 LAB — CANNABINOIDS, MS, UR RFX
Cannabinoids Confirmation: POSITIVE — AB
Carboxy-THC: 1031 ng/mg{creat}

## 2024-06-15 ENCOUNTER — Other Ambulatory Visit (HOSPITAL_COMMUNITY): Payer: Self-pay

## 2024-06-18 ENCOUNTER — Other Ambulatory Visit (HOSPITAL_COMMUNITY): Payer: Self-pay

## 2024-06-18 ENCOUNTER — Other Ambulatory Visit: Payer: Self-pay | Admitting: Nurse Practitioner

## 2024-06-18 DIAGNOSIS — D5700 Hb-SS disease with crisis, unspecified: Secondary | ICD-10-CM

## 2024-06-19 NOTE — Telephone Encounter (Signed)
 Please advise [facility]

## 2024-06-23 ENCOUNTER — Other Ambulatory Visit (HOSPITAL_COMMUNITY): Payer: Self-pay

## 2024-06-23 MED ORDER — OXYCODONE HCL 10 MG PO TABS
10.0000 mg | ORAL_TABLET | Freq: Four times a day (QID) | ORAL | 0 refills | Status: DC | PRN
  Filled 2024-06-23: qty 60, 15d supply, fill #0

## 2024-07-04 ENCOUNTER — Other Ambulatory Visit: Payer: Self-pay | Admitting: Nurse Practitioner

## 2024-07-04 ENCOUNTER — Other Ambulatory Visit: Payer: Self-pay

## 2024-07-04 ENCOUNTER — Other Ambulatory Visit (HOSPITAL_COMMUNITY): Payer: Self-pay

## 2024-07-04 DIAGNOSIS — R110 Nausea: Secondary | ICD-10-CM

## 2024-07-04 DIAGNOSIS — D5700 Hb-SS disease with crisis, unspecified: Secondary | ICD-10-CM

## 2024-07-04 MED ORDER — PROMETHAZINE HCL 25 MG PO TABS
25.0000 mg | ORAL_TABLET | Freq: Four times a day (QID) | ORAL | 0 refills | Status: DC | PRN
  Filled 2024-07-04: qty 20, 5d supply, fill #0

## 2024-07-04 MED ORDER — TIZANIDINE HCL 4 MG PO TABS
4.0000 mg | ORAL_TABLET | Freq: Three times a day (TID) | ORAL | 0 refills | Status: DC | PRN
  Filled 2024-07-04: qty 15, 5d supply, fill #0

## 2024-07-04 MED ORDER — OXYCODONE HCL 10 MG PO TABS
10.0000 mg | ORAL_TABLET | Freq: Four times a day (QID) | ORAL | 0 refills | Status: DC | PRN
  Filled 2024-07-07: qty 60, 15d supply, fill #0

## 2024-07-04 NOTE — Telephone Encounter (Signed)
 Please advise [facility]

## 2024-07-07 ENCOUNTER — Other Ambulatory Visit (HOSPITAL_COMMUNITY): Payer: Self-pay

## 2024-07-16 ENCOUNTER — Other Ambulatory Visit: Payer: Self-pay | Admitting: Nurse Practitioner

## 2024-07-16 DIAGNOSIS — R110 Nausea: Secondary | ICD-10-CM

## 2024-07-16 DIAGNOSIS — D5700 Hb-SS disease with crisis, unspecified: Secondary | ICD-10-CM

## 2024-07-17 ENCOUNTER — Other Ambulatory Visit (HOSPITAL_COMMUNITY): Payer: Self-pay

## 2024-07-17 MED ORDER — PROMETHAZINE HCL 25 MG PO TABS
25.0000 mg | ORAL_TABLET | Freq: Four times a day (QID) | ORAL | 0 refills | Status: DC | PRN
  Filled 2024-07-17: qty 20, 5d supply, fill #0

## 2024-07-17 MED ORDER — OXYCODONE HCL 10 MG PO TABS
10.0000 mg | ORAL_TABLET | Freq: Four times a day (QID) | ORAL | 0 refills | Status: DC | PRN
  Filled ????-??-??: fill #0

## 2024-07-18 ENCOUNTER — Emergency Department (HOSPITAL_COMMUNITY): Admission: EM | Admit: 2024-07-18 | Discharge: 2024-07-18 | Disposition: A | Discharge status: Home / Self Care

## 2024-07-18 ENCOUNTER — Other Ambulatory Visit: Payer: Self-pay

## 2024-07-18 DIAGNOSIS — D5700 Hb-SS disease with crisis, unspecified: Secondary | ICD-10-CM | POA: Diagnosis not present

## 2024-07-18 DIAGNOSIS — M545 Low back pain, unspecified: Secondary | ICD-10-CM | POA: Diagnosis present

## 2024-07-18 LAB — RETICULOCYTES
Immature Retic Fract: 35 % — ABNORMAL HIGH (ref 2.3–15.9)
RBC.: 5.83 MIL/uL — ABNORMAL HIGH (ref 4.22–5.81)
Retic Count, Absolute: 264.1 K/uL — ABNORMAL HIGH (ref 19.0–186.0)
Retic Ct Pct: 4.5 % — ABNORMAL HIGH (ref 0.4–3.1)

## 2024-07-18 LAB — CBC WITH DIFFERENTIAL/PLATELET
Abs Immature Granulocytes: 0.05 K/uL (ref 0.00–0.07)
Basophils Absolute: 0 K/uL (ref 0.0–0.1)
Basophils Relative: 0 %
Eosinophils Absolute: 0.1 K/uL (ref 0.0–0.5)
Eosinophils Relative: 1 %
HCT: 45.7 % (ref 39.0–52.0)
Hemoglobin: 16.4 g/dL (ref 13.0–17.0)
Immature Granulocytes: 0 %
Lymphocytes Relative: 6 %
Lymphs Abs: 0.9 K/uL (ref 0.7–4.0)
MCH: 28.2 pg (ref 26.0–34.0)
MCHC: 35.9 g/dL (ref 30.0–36.0)
MCV: 78.7 fL — ABNORMAL LOW (ref 80.0–100.0)
Monocytes Absolute: 1.1 K/uL — ABNORMAL HIGH (ref 0.1–1.0)
Monocytes Relative: 7 %
Neutro Abs: 13 K/uL — ABNORMAL HIGH (ref 1.7–7.7)
Neutrophils Relative %: 86 %
Platelets: 145 K/uL — ABNORMAL LOW (ref 150–400)
RBC: 5.81 MIL/uL (ref 4.22–5.81)
RDW: 14.7 % (ref 11.5–15.5)
WBC: 15.1 K/uL — ABNORMAL HIGH (ref 4.0–10.5)
nRBC: 0.3 % — ABNORMAL HIGH (ref 0.0–0.2)

## 2024-07-18 LAB — COMPREHENSIVE METABOLIC PANEL WITH GFR
ALT: 24 U/L (ref 0–44)
AST: 11 U/L — ABNORMAL LOW (ref 15–41)
Albumin: 5.1 g/dL — ABNORMAL HIGH (ref 3.5–5.0)
Alkaline Phosphatase: 78 U/L (ref 38–126)
Anion gap: 10 (ref 5–15)
BUN: 9 mg/dL (ref 6–20)
CO2: 27 mmol/L (ref 22–32)
Calcium: 10.2 mg/dL (ref 8.9–10.3)
Chloride: 101 mmol/L (ref 98–111)
Creatinine, Ser: 0.89 mg/dL (ref 0.61–1.24)
GFR, Estimated: >60 mL/min
Glucose, Bld: 104 mg/dL — ABNORMAL HIGH (ref 70–99)
Potassium: 4.6 mmol/L (ref 3.5–5.1)
Sodium: 138 mmol/L (ref 135–145)
Total Bilirubin: 2.2 mg/dL — ABNORMAL HIGH (ref 0.0–1.2)
Total Protein: 9.1 g/dL — ABNORMAL HIGH (ref 6.5–8.1)

## 2024-07-18 LAB — LIPASE, BLOOD: Lipase: 20 U/L (ref 11–51)

## 2024-07-18 MED ORDER — ONDANSETRON HCL 4 MG/2ML IJ SOLN
4.0000 mg | Freq: Once | INTRAMUSCULAR | Status: AC
  Administered 2024-07-18: 4 mg via INTRAVENOUS
  Filled 2024-07-18: qty 2

## 2024-07-18 MED ORDER — PROMETHAZINE HCL 25 MG PO TABS
25.0000 mg | ORAL_TABLET | ORAL | Status: DC | PRN
  Filled 2024-07-18: qty 1

## 2024-07-18 MED ORDER — LACTATED RINGERS IV BOLUS
1000.0000 mL | Freq: Once | INTRAVENOUS | Status: AC
  Administered 2024-07-18: 1000 mL via INTRAVENOUS

## 2024-07-18 MED ORDER — OXYCODONE HCL 10 MG PO TABS: 10.0000 mg | ORAL_TABLET | Freq: Four times a day (QID) | ORAL | 0 refills | Status: AC | PRN

## 2024-07-18 MED ORDER — HYDROMORPHONE HCL 1 MG/ML IJ SOLN
2.0000 mg | INTRAMUSCULAR | Status: AC
  Administered 2024-07-18: 2 mg via INTRAVENOUS
  Filled 2024-07-18: qty 2

## 2024-07-18 MED ORDER — SODIUM CHLORIDE 0.9 % IV SOLN
25.0000 mg | Freq: Once | INTRAVENOUS | Status: AC
  Administered 2024-07-18: 25 mg via INTRAVENOUS
  Filled 2024-07-18: qty 25

## 2024-07-18 NOTE — ED Notes (Signed)
 IV attempt with lab draw unsuccessful.

## 2024-07-18 NOTE — ED Triage Notes (Signed)
 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

## 2024-07-18 NOTE — ED Provider Notes (Signed)
 Sickle cell pain crisis, improved with IV medication.  Patient received refill of his 15-day prescription on the 10th of this month.  He put in for a refill, but unfortunately the pharmacy that he usually goes to cannot fill his prescription until the 27th.  Reviewed this on the patient's phone.  I have sent a 2-day prescription for the patient's medications to his pharmacy.  Clear lungs and normal vital signs at discharge.   Mannie Pac T, DO 07/18/24 [phone]

## 2024-07-18 NOTE — ED Provider Notes (Signed)
 " [place] EMERGENCY DEPARTMENT AT [facility] Provider Note   CSN: [id number] Arrival date & time: 07/18/24  1138     Patient presents with: Sickle Cell Pain Crisis   SOLOMAN MCKEITHAN is a 33 y.o. male.   This is a 33 year old male presenting emergency department with sickle cell pain.  Started this morning.  Reports in legs and low back.  Took his last oxycodone  this morning.  This is his typical sickle cell pain presentation.  Having some nausea, vomited earlier today as well.  Again, typical when he has sickle cell pain.  No fevers or chills.  No chest pain, shortness of breath or fevers.  No cough.  Not having abdominal pain   Sickle Cell Pain Crisis      Prior to Admission medications  Medication Sig Start Date End Date Taking? Authorizing Provider  ibuprofen  (ADVIL ) 800 MG tablet Take 1 tablet (800 mg total) by mouth every 8 (eight) hours as needed (pain). Patient not taking: Reported on 06/04/2024 12/13/23   Vonna Sharlet POUR, MD  Oxycodone  HCl 10 MG TABS Take 1 tablet (10 mg total) by mouth every 6 (six) hours as needed. 07/21/24   Oley Bascom RAMAN, NP  promethazine  (PHENERGAN ) 25 MG tablet Take 1 tablet (25 mg total) by mouth every 6 (six) hours as needed for nausea or vomiting. 07/17/24   Nichols, Tonya S, NP  tadalafil (CIALIS) 5 MG tablet Take 5 mg by mouth daily as needed for erectile dysfunction. 08/29/23   [provider]  tiZANidine  (ZANAFLEX ) 4 MG tablet Take 1 tablet (4 mg total) by mouth every 8 (eight) hours as needed for muscle spasms. 07/04/24   Oley Bascom RAMAN, NP  Vitamin D , Ergocalciferol , (DRISDOL ) 1.25 MG (50000 UNIT) CAPS capsule Take 1 capsule (50,000 Units total) by mouth every 7 (seven) days. 08/29/23   Nichols, Tonya S, NP    Allergies: Patient has no known allergies.    Review of Systems  Updated Vital Signs BP (!) 149/103 (BP Location: Left Arm)   Pulse 81   Temp 97.6 F (36.4 C) (Oral)   Resp 17   Ht 5' 8 (1.727 m)    Wt 90.3 kg   SpO2 99%   BMI 30.26 kg/m   Physical Exam Vitals and nursing note reviewed.  Constitutional:      General: He is not in acute distress.    Appearance: He is not toxic-appearing.  HENT:     Nose: Nose normal.  Eyes:     Conjunctiva/sclera: Conjunctivae normal.  Cardiovascular:     Rate and Rhythm: Normal rate and regular rhythm.  Pulmonary:     Effort: Pulmonary effort is normal.     Breath sounds: Normal breath sounds.  Abdominal:     General: Abdomen is flat. There is no distension.     Tenderness: There is no abdominal tenderness. There is no guarding or rebound.  Musculoskeletal:        General: Normal range of motion.  Skin:    General: Skin is warm and dry.     Capillary Refill: Capillary refill takes less than 2 seconds.  Neurological:     Mental Status: He is alert and oriented to person, place, and time.  Psychiatric:        Mood and Affect: Mood normal.        Behavior: Behavior normal.     (all labs ordered are listed, but only abnormal results are displayed) Labs Reviewed  COMPREHENSIVE  METABOLIC PANEL WITH GFR - Abnormal; Notable for the following components:      Result Value   Glucose, Bld 104 (*)    Total Protein 9.1 (*)    Albumin 5.1 (*)    AST 11 (*)    Total Bilirubin 2.2 (*)    All other components within normal limits  CBC WITH DIFFERENTIAL/PLATELET - Abnormal; Notable for the following components:   WBC 15.1 (*)    MCV 78.7 (*)    Platelets 145 (*)    nRBC 0.3 (*)    Neutro Abs 13.0 (*)    Monocytes Absolute 1.1 (*)    All other components within normal limits  LIPASE, BLOOD  RETICULOCYTES    EKG: EKG Interpretation Date/Time:  Wednesday July 18 2024 11:47:19 EST Ventricular Rate:  82 PR Interval:  129 QRS Duration:  95 QT Interval:  333 QTC Calculation: 389 R Axis:   81  Text Interpretation: Sinus rhythm Consider anterolateral infarct Confirmed by Neysa Clap [phone]) on 07/18/2024 3:21:04 PM  Radiology: No  results found.   Procedures   Medications Ordered in the ED  HYDROmorphone  (DILAUDID ) injection 2 mg (has no administration in time range)  promethazine  (PHENERGAN ) tablet 25 mg (has no administration in time range)  ondansetron  (ZOFRAN ) injection 4 mg (has no administration in time range)  lactated ringers  bolus 1,000 mL (has no administration in time range)  diphenhydrAMINE  (BENADRYL ) 25 mg in sodium chloride  0.9 % 50 mL IVPB (25 mg Intravenous New Bag/Given 07/18/24 1434)  HYDROmorphone  (DILAUDID ) injection 2 mg (2 mg Intravenous Given 07/18/24 1425)  HYDROmorphone  (DILAUDID ) injection 2 mg (2 mg Intravenous Given 07/18/24 1500)    Clinical Course as of 07/18/24 1521  Wed Jul 18, 2024  1511 Comprehensive metabolic panel(!) Similar to prior labs [TY]  1511 CBC with Differential(!) No anemia [TY]  1512 Lipase: 20 [TY]  1518 Patient reports improvement after 2 rounds of pain medications.  Will likely need third.  Still complaining of some nausea. [TY]    Clinical Course User Index [TY] Neysa Clap PARAS, DO                                 Medical Decision Making This is a 33 year old male history of sickle cell disease presenting emergency department with sickle cell pain.  This is typical pain pattern and character.  He is afebrile nontachycardic, normotensive.  Physical exam reassuring soft nontender abdomen.  His last admission seems to have been in July provide chart review.  Will get screening labs, will treat with pain medications and reevaluate.  See ED course for further MDM final disposition.  Amount and/or Complexity of Data Reviewed Labs: ordered. Decision-making details documented in ED Course.    Details: See ED course Radiology:     Details: Considered imaging, but soft nontender abdomen  Risk Prescription drug management. Decision regarding hospitalization. Diagnosis or treatment significantly limited by social determinants of health.       Final diagnoses:   Sickle cell pain crisis [facility])    ED Discharge Orders     None          Neysa Clap PARAS, DO 07/18/24 1521  "

## 2024-07-18 NOTE — Discharge Instructions (Signed)
 Follow-up with your hematology team.  I have sent 2 days worth of oxycodone  to your pharmacy.  Return to the emergency room if you have worsening pain, fever or difficulty with your breathing.

## 2024-07-20 ENCOUNTER — Other Ambulatory Visit: Payer: Self-pay | Admitting: Nurse Practitioner

## 2024-07-20 ENCOUNTER — Other Ambulatory Visit (HOSPITAL_COMMUNITY): Payer: Self-pay

## 2024-07-20 ENCOUNTER — Telehealth: Payer: Self-pay | Admitting: Nurse Practitioner

## 2024-07-20 MED ORDER — OXYCODONE HCL 10 MG PO TABS
10.0000 mg | ORAL_TABLET | Freq: Four times a day (QID) | ORAL | 0 refills | Status: DC | PRN
  Filled 2024-07-20 – 2024-07-22 (×3): qty 60, 15d supply, fill #0

## 2024-07-20 NOTE — Telephone Encounter (Signed)
 oxyCODONE  10 MG TABS [[id number]]  Patient verbalized he is currently out of his medication. When he was in the ED on 07/18/24, he was only provided a two day supply of this medication. Patient has to work all day tomorrow and cannot get off to get his medication and needs his rx sent today for pick-up to prevent having to go to the ED tomorrow.  Please f/u with patient

## 2024-07-21 ENCOUNTER — Other Ambulatory Visit (HOSPITAL_COMMUNITY): Payer: Self-pay

## 2024-07-22 ENCOUNTER — Other Ambulatory Visit (HOSPITAL_COMMUNITY): Payer: Self-pay

## 2024-08-03 ENCOUNTER — Other Ambulatory Visit: Payer: Self-pay | Admitting: Nurse Practitioner

## 2024-08-03 ENCOUNTER — Other Ambulatory Visit (HOSPITAL_COMMUNITY): Payer: Self-pay

## 2024-08-03 DIAGNOSIS — R110 Nausea: Secondary | ICD-10-CM

## 2024-08-03 DIAGNOSIS — E559 Vitamin D deficiency, unspecified: Secondary | ICD-10-CM

## 2024-08-03 MED ORDER — PROMETHAZINE HCL 25 MG PO TABS
25.0000 mg | ORAL_TABLET | Freq: Four times a day (QID) | ORAL | 0 refills | Status: DC | PRN
  Filled 2024-08-03: qty 20, 5d supply, fill #0

## 2024-08-03 MED ORDER — OXYCODONE HCL 10 MG PO TABS
10.0000 mg | ORAL_TABLET | Freq: Four times a day (QID) | ORAL | 0 refills | Status: DC | PRN
  Filled 2024-08-05: qty 60, 15d supply, fill #0

## 2024-08-03 MED ORDER — VITAMIN D (ERGOCALCIFEROL) 1.25 MG (50000 UNIT) PO CAPS
50000.0000 [IU] | ORAL_CAPSULE | ORAL | 3 refills | Status: AC
  Filled 2024-08-03: qty 12, 84d supply, fill #0

## 2024-08-03 MED ORDER — TIZANIDINE HCL 4 MG PO TABS
4.0000 mg | ORAL_TABLET | Freq: Three times a day (TID) | ORAL | 0 refills | Status: AC | PRN
  Filled 2024-08-03: qty 15, 5d supply, fill #0

## 2024-08-03 NOTE — Telephone Encounter (Signed)
 Please advise [facility]

## 2024-08-05 ENCOUNTER — Other Ambulatory Visit (HOSPITAL_COMMUNITY): Payer: Self-pay

## 2024-08-20 ENCOUNTER — Other Ambulatory Visit: Payer: Self-pay | Admitting: Nurse Practitioner

## 2024-08-20 DIAGNOSIS — R110 Nausea: Secondary | ICD-10-CM

## 2024-08-22 ENCOUNTER — Other Ambulatory Visit (HOSPITAL_COMMUNITY): Payer: Self-pay

## 2024-08-22 MED ORDER — PROMETHAZINE HCL 25 MG PO TABS
25.0000 mg | ORAL_TABLET | Freq: Four times a day (QID) | ORAL | 0 refills | Status: AC | PRN
  Filled 2024-08-22: qty 20, 5d supply, fill #0

## 2024-08-22 MED ORDER — OXYCODONE HCL 10 MG PO TABS
10.0000 mg | ORAL_TABLET | Freq: Four times a day (QID) | ORAL | 0 refills | Status: AC | PRN
  Filled 2024-08-22: qty 60, 15d supply, fill #0

## 2024-09-05 ENCOUNTER — Ambulatory Visit: Payer: Self-pay | Admitting: Nurse Practitioner
